# Patient Record
Sex: Male | Born: 1960 | Race: White | Hispanic: No | Marital: Married | Smoking: Former smoker
Health system: Southern US, Community
[De-identification: ages and names within clinical notes are randomized; demographics above are authoritative.]

## PROBLEM LIST (undated history)

## (undated) DIAGNOSIS — I10 Essential (primary) hypertension: Secondary | ICD-10-CM

## (undated) DIAGNOSIS — D696 Thrombocytopenia, unspecified: Secondary | ICD-10-CM

## (undated) DIAGNOSIS — Z800 Family history of malignant neoplasm of digestive organs: Secondary | ICD-10-CM

## (undated) DIAGNOSIS — R059 Cough, unspecified: Secondary | ICD-10-CM

## (undated) DIAGNOSIS — R112 Nausea with vomiting, unspecified: Secondary | ICD-10-CM

## (undated) DIAGNOSIS — N2889 Other specified disorders of kidney and ureter: Secondary | ICD-10-CM

## (undated) DIAGNOSIS — Z9889 Other specified postprocedural states: Secondary | ICD-10-CM

## (undated) DIAGNOSIS — Z803 Family history of malignant neoplasm of breast: Secondary | ICD-10-CM

## (undated) DIAGNOSIS — D72819 Decreased white blood cell count, unspecified: Secondary | ICD-10-CM

## (undated) DIAGNOSIS — R05 Cough: Secondary | ICD-10-CM

## (undated) DIAGNOSIS — E119 Type 2 diabetes mellitus without complications: Secondary | ICD-10-CM

## (undated) DIAGNOSIS — E785 Hyperlipidemia, unspecified: Secondary | ICD-10-CM

## (undated) HISTORY — PX: FRACTURE SURGERY: SHX138

## (undated) HISTORY — DX: Family history of malignant neoplasm of digestive organs: Z80.0

## (undated) HISTORY — PX: HYDROCELE EXCISION: SHX482

## (undated) HISTORY — PX: APPENDECTOMY: SHX54

## (undated) HISTORY — PX: SHOULDER SURGERY: SHX246

## (undated) HISTORY — PX: KNEE ARTHROSCOPY: SUR90

## (undated) HISTORY — PX: ANKLE SURGERY: SHX546

## (undated) HISTORY — DX: Family history of malignant neoplasm of breast: Z80.3

## (undated) HISTORY — PX: NASAL SINUS SURGERY: SHX719

## (undated) HISTORY — PX: BACK SURGERY: SHX140

## (undated) HISTORY — PX: PENILE PROSTHESIS IMPLANT: SHX240

---

## 2005-10-03 ENCOUNTER — Ambulatory Visit: Payer: Self-pay | Admitting: Otolaryngology

## 2007-06-04 ENCOUNTER — Ambulatory Visit: Payer: Self-pay | Admitting: Unknown Physician Specialty

## 2007-06-14 ENCOUNTER — Ambulatory Visit: Payer: Self-pay | Admitting: Unknown Physician Specialty

## 2007-07-02 ENCOUNTER — Ambulatory Visit: Payer: Self-pay | Admitting: Unknown Physician Specialty

## 2008-07-28 ENCOUNTER — Ambulatory Visit: Payer: Self-pay | Admitting: Urology

## 2008-08-04 ENCOUNTER — Ambulatory Visit: Payer: Self-pay | Admitting: Urology

## 2010-04-15 ENCOUNTER — Ambulatory Visit: Payer: Self-pay | Admitting: Urology

## 2010-04-21 ENCOUNTER — Ambulatory Visit: Payer: Self-pay | Admitting: Cardiovascular Disease

## 2010-05-03 ENCOUNTER — Ambulatory Visit: Payer: Self-pay | Admitting: Urology

## 2010-05-05 LAB — PATHOLOGY REPORT

## 2010-05-09 ENCOUNTER — Emergency Department: Payer: Self-pay | Admitting: Emergency Medicine

## 2014-04-14 ENCOUNTER — Encounter (HOSPITAL_COMMUNITY): Payer: Self-pay | Admitting: Pharmacy Technician

## 2014-04-15 ENCOUNTER — Encounter (HOSPITAL_COMMUNITY): Payer: Self-pay | Admitting: Surgery

## 2014-04-16 ENCOUNTER — Encounter (HOSPITAL_COMMUNITY): Payer: Self-pay | Admitting: Surgery

## 2014-04-16 ENCOUNTER — Encounter (HOSPITAL_COMMUNITY)
Admission: RE | Disposition: A | Discharge status: Home / Self Care | Payer: Self-pay | Source: Ambulatory Visit | Attending: Orthopedic Surgery

## 2014-04-16 ENCOUNTER — Ambulatory Visit (HOSPITAL_COMMUNITY): Payer: Worker's Compensation

## 2014-04-16 ENCOUNTER — Ambulatory Visit (HOSPITAL_COMMUNITY): Payer: Worker's Compensation | Admitting: Orthopedic Surgery

## 2014-04-16 ENCOUNTER — Observation Stay (HOSPITAL_COMMUNITY)
Admission: RE | Admit: 2014-04-16 | Discharge: 2014-04-17 | Disposition: A | Discharge status: Home / Self Care | Payer: Worker's Compensation | Source: Ambulatory Visit | Attending: Orthopedic Surgery | Admitting: Orthopedic Surgery

## 2014-04-16 ENCOUNTER — Encounter (HOSPITAL_COMMUNITY): Payer: Worker's Compensation | Admitting: Orthopedic Surgery

## 2014-04-16 DIAGNOSIS — M5380 Other specified dorsopathies, site unspecified: Secondary | ICD-10-CM | POA: Insufficient documentation

## 2014-04-16 DIAGNOSIS — I10 Essential (primary) hypertension: Secondary | ICD-10-CM | POA: Insufficient documentation

## 2014-04-16 DIAGNOSIS — E785 Hyperlipidemia, unspecified: Secondary | ICD-10-CM | POA: Insufficient documentation

## 2014-04-16 DIAGNOSIS — M4712 Other spondylosis with myelopathy, cervical region: Principal | ICD-10-CM | POA: Insufficient documentation

## 2014-04-16 DIAGNOSIS — Z79899 Other long term (current) drug therapy: Secondary | ICD-10-CM | POA: Insufficient documentation

## 2014-04-16 DIAGNOSIS — E119 Type 2 diabetes mellitus without complications: Secondary | ICD-10-CM | POA: Insufficient documentation

## 2014-04-16 DIAGNOSIS — Z981 Arthrodesis status: Secondary | ICD-10-CM

## 2014-04-16 HISTORY — PX: ANTERIOR CERVICAL DECOMP/DISCECTOMY FUSION: SHX1161

## 2014-04-16 HISTORY — DX: Type 2 diabetes mellitus without complications: E11.9

## 2014-04-16 HISTORY — DX: Other specified postprocedural states: Z98.890

## 2014-04-16 HISTORY — DX: Nausea with vomiting, unspecified: R11.2

## 2014-04-16 HISTORY — DX: Hyperlipidemia, unspecified: E78.5

## 2014-04-16 LAB — CBC
HCT: 37.3 % — ABNORMAL LOW (ref 39.0–52.0)
Hemoglobin: 13.3 g/dL (ref 13.0–17.0)
MCH: 33.3 pg (ref 26.0–34.0)
MCHC: 35.7 g/dL (ref 30.0–36.0)
MCV: 93.5 fL (ref 78.0–100.0)
Platelets: 63 10*3/uL — ABNORMAL LOW (ref 150–400)
RBC: 3.99 MIL/uL — ABNORMAL LOW (ref 4.22–5.81)
RDW: 13 % (ref 11.5–15.5)
WBC: 3.6 10*3/uL — ABNORMAL LOW (ref 4.0–10.5)

## 2014-04-16 LAB — BASIC METABOLIC PANEL
Anion gap: 16 — ABNORMAL HIGH (ref 5–15)
BUN: 20 mg/dL (ref 6–23)
CO2: 20 mEq/L (ref 19–32)
Calcium: 9.3 mg/dL (ref 8.4–10.5)
Chloride: 96 mEq/L (ref 96–112)
Creatinine, Ser: 1.12 mg/dL (ref 0.50–1.35)
GFR calc Af Amer: 86 mL/min — ABNORMAL LOW (ref >90)
GFR calc non Af Amer: 74 mL/min — ABNORMAL LOW (ref >90)
Glucose, Bld: 182 mg/dL — ABNORMAL HIGH (ref 70–99)
Potassium: 4.4 mEq/L (ref 3.7–5.3)
Sodium: 132 mEq/L — ABNORMAL LOW (ref 137–147)

## 2014-04-16 LAB — SURGICAL PCR SCREEN
MRSA, PCR: NEGATIVE
Staphylococcus aureus: NEGATIVE

## 2014-04-16 LAB — GLUCOSE, CAPILLARY
Glucose-Capillary: 193 mg/dL — ABNORMAL HIGH (ref 70–99)
Glucose-Capillary: 166 mg/dL — ABNORMAL HIGH (ref 70–99)
Glucose-Capillary: 159 mg/dL — ABNORMAL HIGH (ref 70–99)
Glucose-Capillary: 170 mg/dL — ABNORMAL HIGH (ref 70–99)
Glucose-Capillary: 226 mg/dL — ABNORMAL HIGH (ref 70–99)

## 2014-04-16 SURGERY — ANTERIOR CERVICAL DECOMPRESSION/DISCECTOMY FUSION 2 LEVELS
Anesthesia: General

## 2014-04-16 MED ORDER — THROMBIN 20000 UNITS EX SOLR
CUTANEOUS | Status: AC
  Filled 2014-04-16: qty 20000

## 2014-04-16 MED ORDER — SCOPOLAMINE 1 MG/3DAYS TD PT72
1.0000 | MEDICATED_PATCH | TRANSDERMAL | Status: DC
  Administered 2014-04-16: 1.5 mg via TRANSDERMAL
  Filled 2014-04-16: qty 1

## 2014-04-16 MED ORDER — PROPOFOL 10 MG/ML IV BOLUS
INTRAVENOUS | Status: DC | PRN
  Administered 2014-04-16: 180 mg via INTRAVENOUS

## 2014-04-16 MED ORDER — LIDOCAINE HCL (CARDIAC) 20 MG/ML IV SOLN
INTRAVENOUS | Status: DC | PRN
  Administered 2014-04-16: 80 mg via INTRAVENOUS

## 2014-04-16 MED ORDER — FENTANYL CITRATE 0.05 MG/ML IJ SOLN
INTRAMUSCULAR | Status: DC | PRN
  Administered 2014-04-16 (×2): 50 ug via INTRAVENOUS
  Administered 2014-04-16: 100 ug via INTRAVENOUS
  Administered 2014-04-16: 50 ug via INTRAVENOUS
  Administered 2014-04-16: 100 ug via INTRAVENOUS

## 2014-04-16 MED ORDER — INSULIN ASPART 100 UNIT/ML ~~LOC~~ SOLN
0.0000 [IU] | Freq: Three times a day (TID) | SUBCUTANEOUS | Status: DC
  Administered 2014-04-16: 3 [IU] via SUBCUTANEOUS
  Administered 2014-04-17: 5 [IU] via SUBCUTANEOUS
  Administered 2014-04-17: 3 [IU] via SUBCUTANEOUS

## 2014-04-16 MED ORDER — CARVEDILOL 25 MG PO TABS
50.0000 mg | ORAL_TABLET | Freq: Two times a day (BID) | ORAL | Status: DC
  Administered 2014-04-16 – 2014-04-17 (×2): 50 mg via ORAL
  Filled 2014-04-16 (×4): qty 2

## 2014-04-16 MED ORDER — SENNOSIDES-DOCUSATE SODIUM 8.6-50 MG PO TABS
1.0000 | ORAL_TABLET | Freq: Every evening | ORAL | Status: DC | PRN
Start: 2014-04-16 — End: 2014-04-17

## 2014-04-16 MED ORDER — MUPIROCIN 2 % EX OINT
TOPICAL_OINTMENT | CUTANEOUS | Status: AC
  Administered 2014-04-16: 1 via TOPICAL
  Filled 2014-04-16: qty 22

## 2014-04-16 MED ORDER — NEOSTIGMINE METHYLSULFATE 10 MG/10ML IV SOLN
INTRAVENOUS | Status: DC | PRN
  Administered 2014-04-16: 3 mg via INTRAVENOUS

## 2014-04-16 MED ORDER — ONDANSETRON HCL 4 MG/2ML IJ SOLN: 4.0000 mg | Freq: Four times a day (QID) | INTRAMUSCULAR | Status: DC | PRN

## 2014-04-16 MED ORDER — FENTANYL CITRATE 0.05 MG/ML IJ SOLN
25.0000 ug | INTRAMUSCULAR | Status: DC | PRN
  Administered 2014-04-16 (×2): 50 ug via INTRAVENOUS

## 2014-04-16 MED ORDER — DEXAMETHASONE SODIUM PHOSPHATE 10 MG/ML IJ SOLN
INTRAMUSCULAR | Status: DC | PRN
  Administered 2014-04-16: 10 mg via INTRAVENOUS

## 2014-04-16 MED ORDER — 0.9 % SODIUM CHLORIDE (POUR BTL) OPTIME
TOPICAL | Status: DC | PRN
  Administered 2014-04-16: 2000 mL

## 2014-04-16 MED ORDER — ONDANSETRON HCL 4 MG PO TABS
4.0000 mg | ORAL_TABLET | Freq: Four times a day (QID) | ORAL | Status: DC | PRN
Start: 2014-04-16 — End: 2014-04-17

## 2014-04-16 MED ORDER — HYDROCHLOROTHIAZIDE 25 MG PO TABS
25.0000 mg | ORAL_TABLET | Freq: Every day | ORAL | Status: DC
  Administered 2014-04-17: 25 mg via ORAL
  Filled 2014-04-16: qty 1

## 2014-04-16 MED ORDER — FENTANYL CITRATE 0.05 MG/ML IJ SOLN
INTRAMUSCULAR | Status: AC
  Filled 2014-04-16: qty 5

## 2014-04-16 MED ORDER — ACETAMINOPHEN 10 MG/ML IV SOLN
1000.0000 mg | INTRAVENOUS | Status: DC
  Filled 2014-04-16: qty 100

## 2014-04-16 MED ORDER — THROMBIN 20000 UNITS EX SOLR
OROMUCOSAL | Status: DC | PRN
  Administered 2014-04-16: 13:00:00 via TOPICAL

## 2014-04-16 MED ORDER — DIPHENHYDRAMINE HCL 50 MG/ML IJ SOLN
INTRAMUSCULAR | Status: DC | PRN
  Administered 2014-04-16: 12.5 mg via INTRAVENOUS

## 2014-04-16 MED ORDER — ONDANSETRON HCL 4 MG/2ML IJ SOLN
INTRAMUSCULAR | Status: DC | PRN
  Administered 2014-04-16: 4 mg via INTRAVENOUS

## 2014-04-16 MED ORDER — MORPHINE SULFATE 2 MG/ML IJ SOLN: 1.0000 mg | INTRAMUSCULAR | Status: DC | PRN

## 2014-04-16 MED ORDER — CANAGLIFLOZIN 300 MG PO TABS
300.0000 mg | ORAL_TABLET | Freq: Every day | ORAL | Status: DC
  Administered 2014-04-17: 300 mg via ORAL
  Filled 2014-04-16 (×2): qty 1

## 2014-04-16 MED ORDER — PROMETHAZINE HCL 25 MG/ML IJ SOLN: 6.2500 mg | INTRAMUSCULAR | Status: DC | PRN

## 2014-04-16 MED ORDER — ACETAMINOPHEN 10 MG/ML IV SOLN
1000.0000 mg | Freq: Four times a day (QID) | INTRAVENOUS | Status: DC
  Administered 2014-04-16 – 2014-04-17 (×3): 1000 mg via INTRAVENOUS
  Filled 2014-04-16 (×4): qty 100

## 2014-04-16 MED ORDER — SUCCINYLCHOLINE CHLORIDE 20 MG/ML IJ SOLN
INTRAMUSCULAR | Status: DC | PRN
  Administered 2014-04-16: 100 mg via INTRAVENOUS

## 2014-04-16 MED ORDER — LACTATED RINGERS IV SOLN
INTRAVENOUS | Status: DC
  Administered 2014-04-16: 10:00:00 via INTRAVENOUS

## 2014-04-16 MED ORDER — MIDAZOLAM HCL 2 MG/2ML IJ SOLN
INTRAMUSCULAR | Status: AC
  Filled 2014-04-16: qty 2

## 2014-04-16 MED ORDER — LINAGLIPTIN 5 MG PO TABS
5.0000 mg | ORAL_TABLET | Freq: Every day | ORAL | Status: DC
  Administered 2014-04-17: 5 mg via ORAL
  Filled 2014-04-16 (×2): qty 1

## 2014-04-16 MED ORDER — GLYCOPYRROLATE 0.2 MG/ML IJ SOLN
INTRAMUSCULAR | Status: DC | PRN
  Administered 2014-04-16: .4 mg via INTRAVENOUS
  Administered 2014-04-16: 0.2 mg via INTRAVENOUS

## 2014-04-16 MED ORDER — OXYCODONE HCL 5 MG PO TABS: 5.0000 mg | ORAL_TABLET | ORAL | Status: DC | PRN

## 2014-04-16 MED ORDER — MIDAZOLAM HCL 5 MG/5ML IJ SOLN
INTRAMUSCULAR | Status: DC | PRN
  Administered 2014-04-16: 2 mg via INTRAVENOUS

## 2014-04-16 MED ORDER — LOSARTAN POTASSIUM 50 MG PO TABS
100.0000 mg | ORAL_TABLET | Freq: Every day | ORAL | Status: DC
  Administered 2014-04-17: 100 mg via ORAL
  Filled 2014-04-16: qty 2

## 2014-04-16 MED ORDER — METHOCARBAMOL 500 MG PO TABS
500.0000 mg | ORAL_TABLET | Freq: Four times a day (QID) | ORAL | Status: DC | PRN
  Administered 2014-04-16: 500 mg via ORAL

## 2014-04-16 MED ORDER — ROCURONIUM BROMIDE 100 MG/10ML IV SOLN
INTRAVENOUS | Status: DC | PRN
  Administered 2014-04-16: 20 mg via INTRAVENOUS
  Administered 2014-04-16: 30 mg via INTRAVENOUS
  Administered 2014-04-16: 50 mg via INTRAVENOUS

## 2014-04-16 MED ORDER — HEMOSTATIC AGENTS (NO CHARGE) OPTIME
TOPICAL | Status: DC | PRN
  Administered 2014-04-16: 1 via TOPICAL

## 2014-04-16 MED ORDER — METOCLOPRAMIDE HCL 10 MG PO TABS: 5.0000 mg | ORAL_TABLET | Freq: Three times a day (TID) | ORAL | Status: DC | PRN

## 2014-04-16 MED ORDER — LACTATED RINGERS IV SOLN
INTRAVENOUS | Status: DC | PRN
  Administered 2014-04-16 (×3): via INTRAVENOUS

## 2014-04-16 MED ORDER — DOCUSATE SODIUM 100 MG PO CAPS
100.0000 mg | ORAL_CAPSULE | Freq: Two times a day (BID) | ORAL | Status: DC
  Administered 2014-04-16 – 2014-04-17 (×2): 100 mg via ORAL
  Filled 2014-04-16 (×2): qty 1

## 2014-04-16 MED ORDER — METHOCARBAMOL 1000 MG/10ML IJ SOLN
500.0000 mg | Freq: Four times a day (QID) | INTRAVENOUS | Status: DC | PRN
  Filled 2014-04-16: qty 5

## 2014-04-16 MED ORDER — FENTANYL CITRATE 0.05 MG/ML IJ SOLN
INTRAMUSCULAR | Status: AC
  Administered 2014-04-16: 50 ug via INTRAVENOUS
  Filled 2014-04-16: qty 2

## 2014-04-16 MED ORDER — CEFAZOLIN SODIUM 1-5 GM-% IV SOLN
1.0000 g | Freq: Four times a day (QID) | INTRAVENOUS | Status: AC
  Administered 2014-04-16: 2 g via INTRAVENOUS
  Administered 2014-04-16 – 2014-04-17 (×2): 1 g via INTRAVENOUS
  Filled 2014-04-16 (×3): qty 50

## 2014-04-16 MED ORDER — DEXMEDETOMIDINE HCL 200 MCG/2ML IV SOLN
INTRAVENOUS | Status: DC | PRN
  Administered 2014-04-16 (×2): 10 ug via INTRAVENOUS

## 2014-04-16 MED ORDER — PHENYLEPHRINE HCL 10 MG/ML IJ SOLN
INTRAMUSCULAR | Status: DC | PRN
  Administered 2014-04-16 (×2): 80 ug via INTRAVENOUS
  Administered 2014-04-16: 40 ug via INTRAVENOUS

## 2014-04-16 MED ORDER — AMLODIPINE BESYLATE 10 MG PO TABS
10.0000 mg | ORAL_TABLET | Freq: Every day | ORAL | Status: DC
  Administered 2014-04-17: 10 mg via ORAL
  Filled 2014-04-16: qty 1

## 2014-04-16 MED ORDER — PROPOFOL 10 MG/ML IV BOLUS
INTRAVENOUS | Status: AC
  Filled 2014-04-16: qty 20

## 2014-04-16 MED ORDER — METHOCARBAMOL 500 MG PO TABS
ORAL_TABLET | ORAL | Status: AC
  Administered 2014-04-16: 500 mg via ORAL
  Filled 2014-04-16: qty 1

## 2014-04-16 MED ORDER — BUPIVACAINE-EPINEPHRINE 0.25% -1:200000 IJ SOLN
INTRAMUSCULAR | Status: DC | PRN
  Administered 2014-04-16: 10 mL

## 2014-04-16 MED ORDER — POTASSIUM CHLORIDE IN NACL 20-0.45 MEQ/L-% IV SOLN
INTRAVENOUS | Status: DC
  Administered 2014-04-16: 19:00:00 via INTRAVENOUS
  Filled 2014-04-16 (×3): qty 1000

## 2014-04-16 MED ORDER — LOSARTAN POTASSIUM-HCTZ 100-25 MG PO TABS: 1.0000 | ORAL_TABLET | Freq: Every day | ORAL | Status: DC

## 2014-04-16 MED ORDER — METOCLOPRAMIDE HCL 5 MG/ML IJ SOLN: 5.0000 mg | Freq: Three times a day (TID) | INTRAMUSCULAR | Status: DC | PRN

## 2014-04-16 MED ORDER — MUPIROCIN 2 % EX OINT
1.0000 "application " | TOPICAL_OINTMENT | Freq: Once | CUTANEOUS | Status: AC
  Administered 2014-04-16: 1 via TOPICAL

## 2014-04-16 MED ORDER — MEPERIDINE HCL 25 MG/ML IJ SOLN: 6.2500 mg | INTRAMUSCULAR | Status: DC | PRN

## 2014-04-16 MED ORDER — ACETAMINOPHEN 10 MG/ML IV SOLN
INTRAVENOUS | Status: DC | PRN
  Administered 2014-04-16: 1000 mg via INTRAVENOUS

## 2014-04-16 SURGICAL SUPPLY — 65 items
BENZOIN TINCTURE PRP APPL 2/3 (GAUZE/BANDAGES/DRESSINGS) ×2 IMPLANT
BLADE SURG ROTATE 9660 (MISCELLANEOUS) IMPLANT
BUR EGG ELITE 4.0 (BURR) IMPLANT
BUR MATCHSTICK NEURO 3.0 LAGG (BURR) IMPLANT
CANISTER SUCTION 2500CC (MISCELLANEOUS) ×2 IMPLANT
CLSR STERI-STRIP ANTIMIC 1/2X4 (GAUZE/BANDAGES/DRESSINGS) ×2 IMPLANT
CORDS BIPOLAR (ELECTRODE) ×2 IMPLANT
COVER SURGICAL LIGHT HANDLE (MISCELLANEOUS) ×2 IMPLANT
CRADLE DONUT ADULT HEAD (MISCELLANEOUS) ×2 IMPLANT
DEVICE ENDSKLTN TC MED 8MM (Orthopedic Implant) ×2 IMPLANT
DRAPE C-ARM 42X72 X-RAY (DRAPES) ×2 IMPLANT
DRAPE POUCH INSTRU U-SHP 10X18 (DRAPES) ×2 IMPLANT
DRAPE SURG 17X23 STRL (DRAPES) ×2 IMPLANT
DRAPE U-SHAPE 47X51 STRL (DRAPES) ×2 IMPLANT
DRSG MEPILEX BORDER 4X4 (GAUZE/BANDAGES/DRESSINGS) ×2 IMPLANT
DURAPREP 26ML APPLICATOR (WOUND CARE) ×2 IMPLANT
ELECT COATED BLADE 2.86 ST (ELECTRODE) ×2 IMPLANT
ELECT PENCIL ROCKER SW 15FT (MISCELLANEOUS) ×2 IMPLANT
ELECT REM PT RETURN 9FT ADLT (ELECTROSURGICAL) ×2
ELECTRODE REM PT RTRN 9FT ADLT (ELECTROSURGICAL) ×1 IMPLANT
ENDOSKELTON TC IMPLANT 8MM MED (Orthopedic Implant) ×4 IMPLANT
GLOVE BIOGEL PI IND STRL 7.5 (GLOVE) ×1 IMPLANT
GLOVE BIOGEL PI IND STRL 8 (GLOVE) ×1 IMPLANT
GLOVE BIOGEL PI IND STRL 8.5 (GLOVE) ×1 IMPLANT
GLOVE BIOGEL PI INDICATOR 7.5 (GLOVE) ×1
GLOVE BIOGEL PI INDICATOR 8 (GLOVE) ×1
GLOVE BIOGEL PI INDICATOR 8.5 (GLOVE) ×1
GLOVE ECLIPSE 8.5 STRL (GLOVE) ×2 IMPLANT
GLOVE ORTHO TXT STRL SZ7.5 (GLOVE) ×2 IMPLANT
GLOVE SURG SS PI 6.5 STRL IVOR (GLOVE) ×2 IMPLANT
GOWN STRL REUS W/ TWL XL LVL3 (GOWN DISPOSABLE) ×2 IMPLANT
GOWN STRL REUS W/TWL 2XL LVL3 (GOWN DISPOSABLE) ×2 IMPLANT
GOWN STRL REUS W/TWL XL LVL3 (GOWN DISPOSABLE) ×2
KIT BASIN OR (CUSTOM PROCEDURE TRAY) ×2 IMPLANT
KIT ROOM TURNOVER OR (KITS) ×2 IMPLANT
NEEDLE SPNL 18GX3.5 QUINCKE PK (NEEDLE) ×2 IMPLANT
NS IRRIG 1000ML POUR BTL (IV SOLUTION) ×2 IMPLANT
PACK ORTHO CERVICAL (CUSTOM PROCEDURE TRAY) ×4 IMPLANT
PACK UNIVERSAL I (CUSTOM PROCEDURE TRAY) ×2 IMPLANT
PAD ARMBOARD 7.5X6 YLW CONV (MISCELLANEOUS) ×4 IMPLANT
PATTIES SURGICAL .25X.25 (GAUZE/BANDAGES/DRESSINGS) ×2 IMPLANT
PIN RETAINER PRODISC 14 MM (PIN) ×2 IMPLANT
PLATE SKYLINE TWO LEVEL 32MM (Plate) ×2 IMPLANT
PUTTY BONE DBX 5CC MIX (Putty) ×2 IMPLANT
RESTRAINT LIMB HOLDER UNIV (RESTRAINTS) ×2 IMPLANT
SCREW SKYLINE 14MM SD-VA (Screw) ×8 IMPLANT
SCREW SKYLINE 16MM (Screw) ×4 IMPLANT
SPONGE INTESTINAL PEANUT (DISPOSABLE) ×2 IMPLANT
SPONGE LAP 4X18 X RAY DECT (DISPOSABLE) ×4 IMPLANT
SPONGE SURGIFOAM ABS GEL 100 (HEMOSTASIS) ×2 IMPLANT
SURGIFLO TRUKIT (HEMOSTASIS) IMPLANT
SURGIFLO W/THROMBIN 8M KIT (HEMOSTASIS) ×2 IMPLANT
SUT BONE WAX W31G (SUTURE) ×2 IMPLANT
SUT MON AB 3-0 SH 27 (SUTURE) ×1
SUT MON AB 3-0 SH27 (SUTURE) ×1 IMPLANT
SUT SILK 2 0 (SUTURE)
SUT SILK 2-0 18XBRD TIE 12 (SUTURE) IMPLANT
SUT VIC AB 2-0 CT1 18 (SUTURE) ×2 IMPLANT
SYR BULB IRRIGATION 50ML (SYRINGE) ×2 IMPLANT
SYR CONTROL 10ML LL (SYRINGE) ×2 IMPLANT
TAPE CLOTH 4X10 WHT NS (GAUZE/BANDAGES/DRESSINGS) ×2 IMPLANT
TAPE UMBILICAL COTTON 1/8X30 (MISCELLANEOUS) ×2 IMPLANT
TOWEL OR 17X24 6PK STRL BLUE (TOWEL DISPOSABLE) ×2 IMPLANT
TOWEL OR 17X26 10 PK STRL BLUE (TOWEL DISPOSABLE) ×2 IMPLANT
WATER STERILE IRR 1000ML POUR (IV SOLUTION) IMPLANT

## 2014-04-16 NOTE — Anesthesia Postprocedure Evaluation (Signed)
  Anesthesia Post-op Note  Patient: Henry Moore  Procedure(s) Performed: Procedure(s): ANTERIOR CERVICAL DECOMPRESSION/DISCECTOMY FUSION  (ACDF C5-C7)   (2 LEVELS)      (N/A)  Patient Location: PACU  Anesthesia Type:General  Level of Consciousness: awake, alert , oriented and patient cooperative  Airway and Oxygen Therapy: Patient Spontanous Breathing  Post-op Pain: mild  Post-op Assessment: Post-op Vital signs reviewed, Patient's Cardiovascular Status Stable, Respiratory Function Stable, Patent Airway, No signs of Nausea or vomiting and Pain level controlled  Post-op Vital Signs: Reviewed and stable  Last Vitals:  Filed Vitals:   04/16/14 1700  BP: 122/68  Pulse: 65  Temp: 36.8 C  Resp: 13    Complications: No apparent anesthesia complications

## 2014-04-16 NOTE — H&P (Signed)
History of Present Illness  The patient is a 53 year old male who presents with neck pain. The patient is seen today in referral from Dr Gladstone Lighter. The patient reports symptoms involving the entire neck which began 6 month(s) ago. The symptoms began following a specific injury. The injury occurred while the patient was at work (shoveling sand and "something gave in the neck and back"). Symptoms include neck pain, neck stiffness, impaired range of motion and numbness (into both hands).The patient describes their symptoms as moderate in severity.The patient does feel that the symptoms are worsening. Symptoms are exacerbated by use of the right arm, use of the left arm, neck flexion, neck extension and neck movement. The patient is not currently being treated for this problem. Prior to being seen today the patient was previously evaluated in this clinic (Dr Gladstone Lighter). Past evaluation has included cervical spine MRI ([facility]). 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  Work Status- Light Duty  Reason for Consultation: Significant neck and right arm pain as well as thoracic and lumbar pain.  Bedford is a very pleasant gentleman who unfortunately injured himself in the course of performing his normal job activities. He was trying to clear out a hopper of hardened salt in February and felt a pop in his neck. Subsequently he was diagnosed with a cervical strain as well as thoracic and lumbar strain. Ultimately not improving, he had cervical, thoracic, and lumbar MRIs. As a result of those studies, he was referred to me for further work up. For the last 6 months he has been complaining of significant pain with a burning dysesthesia into the right upper extremity. He describes as sensation of pins and needles and discomfort. He has not really noticed any significant weakness, but he is quite concerned because of the pain.  Allergies  Codeine/Codeine Derivatives Itching.  Family  History Diabetes Mellitus Mother.  Social History  Not under pain contract Tobacco use Current every day smoker. 10/11/2013: smoke(d) 1/2 pack(s) per day Number of flights of stairs before winded greater than 5 Children 2 Current drinker 10/11/2013: Currently drinks beer 5-7 times per week No history of drug/alcohol rehab Marital status married Living situation live with spouse Exercise Exercises weekly; does running / walking Current work status working full time  Medication History AmLODIPine Besylate (10MG  Tablet, Oral) Active. Lovastatin (40MG  Tablet, Oral) Active. Losartan Potassium-HCTZ (100-25MG  Tablet, Oral) Active. Januvia (100MG  Tablet, Oral) Active. Carvedilol (25MG  Tablet, Oral) Active. Carisoprodol (350MG  Tablet, Oral) Active.  Past Surgical History  Ankle Surgery left Appendectomy Sinus Surgery Arthroscopy of Knee bilateral  Other Problems High blood pressure Diabetes Mellitus, Type II Hypercholesterolemia  Objective Transcription  He is a pleasant gentleman who appears his stated age in no acute distress. He is alert and oriented times three. He has stiffness and loss of normal cervical and lumbar range of motion. He has pain with palpation and range of motion testing. He has positive Spurling sign on the right side. He has numbness and dysesthesias in the C6 and C7 distributions on the right side only. He has 1+ symmetrical deep tendon reflexes. Negative Babinski test. Negative Hoffmann sign. No clonus. No real shoulder, elbow, or wrist pain with isolated joint range of motion. Negative straight leg raise test. No localized hip, knee, or ankle pain with joint range of motion.  RADIOGRAPHS His MRI of the lumbar and thoracic spine are essentially unremarkable. No significant pathology noted.  The MRI of his cervical spine shows 2-level disc  protrusion with osteophytes. C6-C7 is the most prominent with compression and mass effect on the  right C7 nerve root and there is some irritation and contact to the C6 nerve root.   Plans Transcription  At this point in time, clinically the patient has both C6 and C7 radicular symptoms. These include numbness and some trace weakness in the biceps, triceps, and wrist extensor. Given the fact that this has been going on for over 6 months and his pain has been getting worse, I am quite concerned. We have talked about a nonoperative treatment consisting of physiotherapy and injection therapy and we have reviewed a surgical solution which would be a 2-level ACDF. At this point after reviewing the risks infection, bleeding, nerve damage, death, stroke, paralysis, failure to heal, need for further surgery, and ongoing or worse pain, loss of bowel or bladder control, throat pain, swallowing difficulties, hoarseness in the voice, hardware failure, failure to fuse, and need for posterior surgery, he has decided to proceed with surgery. We have gone over the procedure which would be a 2-level ACDF. My hope would be that at bout 9-10 months postop he would be able to return to full duties and hopefully between 9-12 months, he would reach [place]. For the first 6 weeks after surgery, he would be taking it easy. Following that, he would start formalized physical therapy with a transition to a work Medical illustrator and hopefully gradual return up to his regular duties. Because of the nicotine use, I would like to use a stiff collar for 6 weeks as well as an external bone stimulator for about 9 months. All of this was done in the presence of the patient, his case manager, and his employer. All of their questions were addressed. We will get preoperative medical clearance from his primary care physician given the fact that he has diabetes. We have had a long talk, again, about smoking cessation and he has indicated that he will take all steps to curtail and stop his nicotine use.

## 2014-04-16 NOTE — Transfer of Care (Signed)
Immediate Anesthesia Transfer of Care Note  Patient: Henry Moore  Procedure(s) Performed: Procedure(s): ANTERIOR CERVICAL DECOMPRESSION/DISCECTOMY FUSION  (ACDF C5-C7)   (2 LEVELS)      (N/A)  Patient Location: PACU  Anesthesia Type:General  Level of Consciousness: awake, alert  and oriented  Airway & Oxygen Therapy: Patient Spontanous Breathing and Patient connected to face mask oxygen  Post-op Assessment: Report given to PACU RN and Post -op Vital signs reviewed and stable  Post vital signs: Reviewed and stable  Complications: No apparent anesthesia complications

## 2014-04-16 NOTE — Anesthesia Preprocedure Evaluation (Addendum)
Anesthesia Evaluation  Patient identified by MRN, date of birth, ID band Patient awake    Reviewed: Allergy & Precautions, H&P , NPO status , Patient's Chart, lab work & pertinent test results  History of Anesthesia Complications (+) PONV  Airway Mallampati: II      Dental   Pulmonary Current Smoker,  breath sounds clear to auscultation        Cardiovascular hypertension, Pt. on medications Rhythm:Regular Rate:Normal     Neuro/Psych    GI/Hepatic   Endo/Other  diabetes, Type 2  Renal/GU      Musculoskeletal   Abdominal   Peds  Hematology   Anesthesia Other Findings Facial hair  Reproductive/Obstetrics                         Anesthesia Physical Anesthesia Plan  ASA: III  Anesthesia Plan: General   Post-op Pain Management:    Induction: Intravenous  Airway Management Planned: Oral ETT  Additional Equipment:   Intra-op Plan:   Post-operative Plan: Extubation in OR  Informed Consent: I have reviewed the patients History and Physical, chart, labs and discussed the procedure including the risks, benefits and alternatives for the proposed anesthesia with the patient or authorized representative who has indicated his/her understanding and acceptance.     Plan Discussed with:   Anesthesia Plan Comments:         Anesthesia Quick Evaluation

## 2014-04-16 NOTE — Brief Op Note (Signed)
04/16/2014  4:13 PM  PATIENT:  Henry Moore  53 y.o. male  PRE-OPERATIVE DIAGNOSIS:  2 LEVEL CERVICAL RADICULOPATHY   POST-OPERATIVE DIAGNOSIS:  2 LEVEL CERVICAL RADICULOPATHY   PROCEDURE:  Procedure(s): ANTERIOR CERVICAL DECOMPRESSION/DISCECTOMY FUSION  (ACDF C5-C7)   (2 LEVELS)      (N/A)  SURGEON:  Surgeon(s) and Role:    * Melina Schools, MD - Primary  PHYSICIAN ASSISTANT:   ASSISTANTS: Benjiman Core   ANESTHESIA:   none  EBL:  Total I/O In: 2000 [I.V.:2000] Out: 40 [Blood:40]  BLOOD ADMINISTERED:none  DRAINS: none   LOCAL MEDICATIONS USED:  MARCAINE     SPECIMEN:  No Specimen  DISPOSITION OF SPECIMEN:  N/A  COUNTS:  YES  TOURNIQUET:  * No tourniquets in log *  DICTATION: .Other Dictation: Dictation Number [id number]  PLAN OF CARE: Admit for overnight observation  PATIENT DISPOSITION:  PACU - hemodynamically stable.

## 2014-04-17 ENCOUNTER — Observation Stay (HOSPITAL_COMMUNITY): Payer: Worker's Compensation

## 2014-04-17 ENCOUNTER — Encounter (HOSPITAL_COMMUNITY): Payer: Self-pay | Admitting: Orthopedic Surgery

## 2014-04-17 LAB — BASIC METABOLIC PANEL
Anion gap: 13 (ref 5–15)
BUN: 21 mg/dL (ref 6–23)
CO2: 21 mEq/L (ref 19–32)
Calcium: 8.8 mg/dL (ref 8.4–10.5)
Chloride: 97 mEq/L (ref 96–112)
Creatinine, Ser: 0.98 mg/dL (ref 0.50–1.35)
GFR calc Af Amer: >90 mL/min (ref >90)
GFR calc non Af Amer: >90 mL/min (ref >90)
Glucose, Bld: 166 mg/dL — ABNORMAL HIGH (ref 70–99)
Potassium: 4.9 mEq/L (ref 3.7–5.3)
Sodium: 131 mEq/L — ABNORMAL LOW (ref 137–147)

## 2014-04-17 LAB — GLUCOSE, CAPILLARY
Glucose-Capillary: 179 mg/dL — ABNORMAL HIGH (ref 70–99)
Glucose-Capillary: 203 mg/dL — ABNORMAL HIGH (ref 70–99)

## 2014-04-17 MED ORDER — POLYETHYLENE GLYCOL 3350 17 G PO PACK: 17.0000 g | PACK | Freq: Every day | ORAL | Status: DC

## 2014-04-17 MED ORDER — DOCUSATE SODIUM 100 MG PO CAPS: 100.0000 mg | ORAL_CAPSULE | Freq: Two times a day (BID) | ORAL | Status: DC

## 2014-04-17 MED ORDER — METHOCARBAMOL 500 MG PO TABS: 500.0000 mg | ORAL_TABLET | Freq: Four times a day (QID) | ORAL | Status: DC | PRN

## 2014-04-17 MED ORDER — ONDANSETRON 4 MG PO TBDP: 4.0000 mg | ORAL_TABLET | Freq: Three times a day (TID) | ORAL | Status: DC | PRN

## 2014-04-17 MED ORDER — OXYCODONE-ACETAMINOPHEN 10-325 MG PO TABS
1.0000 | ORAL_TABLET | Freq: Four times a day (QID) | ORAL | Status: DC | PRN
Start: 2014-04-17 — End: 2015-05-13

## 2014-04-17 NOTE — Discharge Summary (Signed)
Patient ID: Henry Moore MRN: [id number] DOB/AGE: 1961/06/21 53 y.o.  Admit date: 04/16/2014 Discharge date: 04/17/2014  Admission Diagnoses:  Active Problems:   S/P cervical spinal fusion   Discharge Diagnoses:  Active Problems:   S/P cervical spinal fusion  status post Procedure(s): ANTERIOR CERVICAL DECOMPRESSION/DISCECTOMY FUSION  (ACDF C5-C7)   (2 LEVELS)       Past Medical History  Diagnosis Date  . PONV (postoperative nausea and vomiting)   . Hypertension   . Diabetes mellitus without complication   . Hyperlipemia     Surgeries: Procedure(s): ANTERIOR CERVICAL DECOMPRESSION/DISCECTOMY FUSION  (ACDF C5-C7)   (2 LEVELS)      on 04/16/2014   Consultants:    Discharged Condition: Improved  Hospital Course: Henry Moore is an 53 y.o. male who was admitted 04/16/2014 for operative treatment of cervical ddd/hnp. Patient failed conservative treatments (please see the history and physical for the specifics) and had severe unremitting pain that affects sleep, daily activities and work/hobbies. After pre-op clearance, the patient was taken to the operating room on 04/16/2014 and underwent  Procedure(s): ANTERIOR CERVICAL DECOMPRESSION/DISCECTOMY FUSION  (ACDF C5-C7)   (2 LEVELS)     .    Patient was given perioperative antibiotics: Anti-infectives   Start     Dose/Rate Route Frequency Ordered Stop   04/16/14 2000  ceFAZolin (ANCEF) IVPB 1 g/50 mL premix     1 g 100 mL/hr over 30 Minutes Intravenous Every 6 hours 04/16/14 1734 04/17/14 0354       Patient was given sequential compression devices and early ambulation to prevent DVT.   Patient benefited maximally from hospital stay and there were no complications. At the time of discharge, the patient was urinating/moving their bowels without difficulty, tolerating a regular diet, pain is controlled with oral pain medications and they have been cleared by PT/OT.   Recent vital signs: Patient Vitals for the past  24 hrs:  BP Temp Temp src Pulse Resp SpO2  04/17/14 1005 132/70 mmHg 98 F (36.7 C) Oral 68 16 97 %  04/17/14 0800 123/64 mmHg 97.4 F (36.3 C) Oral 68 17 100 %  04/17/14 0259 133/68 mmHg 98.4 F (36.9 C) Oral 63 16 100 %  04/16/14 2009 120/67 mmHg 98.3 F (36.8 C) Oral 68 16 99 %  04/16/14 1716 125/67 mmHg 98.3 F (36.8 C) - 68 14 98 %  04/16/14 1700 122/68 mmHg 98.3 F (36.8 C) - 65 13 99 %  04/16/14 1645 121/64 mmHg - - 65 13 97 %  04/16/14 1630 121/62 mmHg - - 67 15 96 %  04/16/14 1615 131/67 mmHg 98.6 F (37 C) - 76 16 96 %     Recent laboratory studies:  Recent Labs  04/16/14 0916 04/17/14 0545  WBC 3.6*  --   HGB 13.3  --   HCT 37.3*  --   PLT 63*  --   NA 132* 131*  K 4.4 4.9  CL 96 97  CO2 20 21  BUN 20 21  CREATININE 1.12 0.98  GLUCOSE 182* 166*  CALCIUM 9.3 8.8     Discharge Medications:     Medication List         amLODipine 10 MG tablet  Commonly known as:  NORVASC  Take 10 mg by mouth daily.     carvedilol 25 MG tablet  Commonly known as:  COREG  Take 50 mg by mouth 2 (two) times daily with a meal.  docusate sodium 100 MG capsule  Commonly known as:  COLACE  Take 1 capsule (100 mg total) by mouth 2 (two) times daily.     INVOKANA 300 MG Tabs  Generic drug:  Canagliflozin  Take 300 mg by mouth daily.     losartan-hydrochlorothiazide 100-25 MG per tablet  Commonly known as:  HYZAAR  Take 1 tablet by mouth daily.     lovastatin 40 MG tablet  Commonly known as:  MEVACOR  Take 40 mg by mouth at bedtime.     methocarbamol 500 MG tablet  Commonly known as:  ROBAXIN  Take 1 tablet (500 mg total) by mouth every 6 (six) hours as needed for muscle spasms.     ondansetron 4 MG disintegrating tablet  Commonly known as:  ZOFRAN ODT  Take 1 tablet (4 mg total) by mouth every 8 (eight) hours as needed.     oxyCODONE-acetaminophen 10-325 MG per tablet  Commonly known as:  PERCOCET  Take 1 tablet by mouth every 6 (six) hours as needed.       polyethylene glycol packet  Commonly known as:  MIRALAX / GLYCOLAX  Take 17 g by mouth daily.     sitaGLIPtin 100 MG tablet  Commonly known as:  JANUVIA  Take 100 mg by mouth daily.        Diagnostic Studies: Dg Chest 2 View  04/16/2014   CLINICAL DATA:  Preop for cervical fusion, hypertension, smoker  EXAM: CHEST  2 VIEW  COMPARISON:  None.  FINDINGS: Cardiomediastinal silhouette is unremarkable. No acute infiltrate or pleural effusion. No pulmonary edema. Mild degenerative changes lower thoracic and upper lumbar spine.  IMPRESSION: No active cardiopulmonary disease.   Electronically Signed   By: Lahoma Crocker M.D.   On: 04/16/2014 09:31   Dg Cervical Spine 2 Or 3 Views  04/17/2014   CLINICAL DATA:  Postop cervical spine fusion  EXAM: CERVICAL SPINE - 2-3 VIEW  COMPARISON:  C-spine intraoperative films of 04/16/2014  FINDINGS: The cervical vertebrae are straightened in alignment. Anterior fusion has been performed at C5-6 and C6-7. The interbody fusion plugs are in good position although the C7-T1 level is not well seen on plain films. There is prominent prevertebral soft tissue noted from C3 to the operative level most consistent with postoperative hematoma. The lung apices are clear.  IMPRESSION: 1. Anterior fusion at C5-6 and C6-7 with straightened alignment. 2. Prominent prevertebral soft tissue may represent postoperative hematoma. Correlate clinically.   Electronically Signed   By: Ivar Drape M.D.   On: 04/17/2014 08:13   Dg Cervical Spine Complete  04/16/2014   CLINICAL DATA:  Status post anterior cervical disc fusion.  EXAM: DG C-ARM 61-120 MIN; CERVICAL SPINE - COMPLETE 4+ VIEW  COMPARISON:  None.  FINDINGS: Four intraoperative fluoroscopic images of the cervical spine were submitted for review. These images demonstrate the patient to be status post surgical anterior fusion of C5, C6 and C7 with interbody spacers. Good alignment of the vertebral bodies is noted.  IMPRESSION: Status post  surgical anterior fusion of C5, C6 and C7.   Electronically Signed   By: Sabino Dick M.D.   On: 04/16/2014 16:11   Dg C-arm 1-60 Min  04/16/2014   CLINICAL DATA:  Status post anterior cervical disc fusion.  EXAM: DG C-ARM 61-120 MIN; CERVICAL SPINE - COMPLETE 4+ VIEW  COMPARISON:  None.  FINDINGS: Four intraoperative fluoroscopic images of the cervical spine were submitted for review. These images demonstrate the patient to  be status post surgical anterior fusion of C5, C6 and C7 with interbody spacers. Good alignment of the vertebral bodies is noted.  IMPRESSION: Status post surgical anterior fusion of C5, C6 and C7.   Electronically Signed   By: Sabino Dick M.D.   On: 04/16/2014 16:11          Follow-up Information   Schedule an appointment as soon as possible for a visit with Dahlia Bailiff, MD. (need return office visit 2 weeks postop)    Specialty:  Orthopedic Surgery   Contact information:   [address] [place] [address] [place] [address] [phone]       Discharge Plan:  discharge to home  Disposition:     Signed: Lanae Crumbly for Dr. Melina Schools [facility] [phone] 04/17/2014, 10:53 AM

## 2014-04-17 NOTE — Discharge Instructions (Signed)

## 2014-04-17 NOTE — Addendum Note (Signed)
Addendum created 04/17/14 2023 by Clearnce Sorrel, CRNA   Modules edited: Anesthesia Medication Administration

## 2014-04-17 NOTE — Progress Notes (Signed)
Utilization Review Completed.Henry Moore T9/18/2015  

## 2014-04-17 NOTE — Progress Notes (Signed)
Discharge home. Home discharge instruction given to patient, no questions verbalized. Aspen collar in place.

## 2014-04-17 NOTE — Op Note (Signed)
Henry Moore, Henry Moore NO.:  [id number]  MEDICAL RECORD NO.:  [id number]  LOCATION:  [id number]                        FACILITY:  [facility]  PHYSICIAN:  Dahlia Bailiff, MD    DATE OF BIRTH:  07-20-1961  DATE OF PROCEDURE:  04/16/2014 DATE OF DISCHARGE:                              OPERATIVE REPORT   FIRST ASSISTANT:  Alyson Locket. Velora Heckler.  He used instrumental in assisting with retraction, visualization, and wound closure.  PREOPERATIVE DIAGNOSIS:  Cervical spondylitic radiculopathy, right side, C5-6 and C6-7.  POSTOPERATIVE DIAGNOSIS:  Cervical spondylitic radiculopathy, right side, C5-6 and C6-7.  OPERATIVE PROCEDURE:  Anterior cervical diskectomy and fusion.  COMPLICATIONS:  None.  CONDITION:  Stable.  INSTRUMENTATION SYSTEM USED:  Titan Titanium intervertebral cages, size 8, medium, packed with DBX Mix and a 32-mm contoured anterior cervical DePuy skyline plate.  HISTORY:  This is a very pleasant 53 year old gentleman who has been complaining of significant neck and radicular arm pain.  Despite appropriate conservative management, it continue to deteriorate.  As a result, we elected to proceed with surgery.  All appropriate risks, benefits, and alternatives were discussed with the patient and consent was obtained.  OPERATIVE NOTE:  The patient was brought to the operating room and placed supine on the operating table.  After successful induction of general anesthesia and endotracheal intubation, TEDs and SCDs were applied, and roll towels were placed between the shoulder blades and the anterior cervical spine was prepped and draped in a standard fashion. Time-out was taken to confirm the patient, procedure and all other pertinent important data.  Once this was done, a transverse incision was made, centered over the C6 vertebral body.  I sharply dissected down to the platysma, which was sharply incised.  I then continued dissection along the medial border  of the sternocleidomastoid down through the deep cervical and prevertebral fascia.  I then swept the trachea and esophagus to the right, protected it with retractor, identified and protected the carotid sheath with finger.  I then used Kittner dissectors to remove the remaining deep cervical prevertebral fascia to expose the anterior longitudinal ligament.  Needles were placed into the disk space at C5-6 and I confirmed the appropriate level.  I then used bipolar electrocautery to mobilize the longus colli muscles from the midbody of C5 to the midbody of C7.  Self-retaining retractors were placed underneath the longus colli muscle, and the retraction was applied.  The endotracheal cuff was deflated for this and then reinflated after resetting the retraction.  I then placed distraction pins into the bodies of C6 and C7.  Gently distracted the disk space. An annulotomy was performed with a 15-blade scalpel.  Then, using a combination of pituitary rongeurs, curettes and Kerrison rongeurs, I resected all the disk material.  I made sure to remove all the cartilaginous endplate from the vertebral bodies.  I then removed the posterior osteophytes from the vertebral bodies of 6 and 7, and then resected a large osteophyte that was in the posterolateral right side consistent with what was seen on his preoperative MRI.  I then used the series of fine nerve hooks to develop a plane underneath the  posterior longitudinal ligament.  I then resected the PLL and exposed the thecal sac.  Once I had an adequate decompression, I then rasped the endplates, irrigated it copiously with normal saline and then placed the appropriate size 8 Titan Titanium intervertebral cage, packed with DBX Mix.  I then removed the distraction pins from C7 and placed it into C5 and then using the same technique, performed an ACDF at C5-6.  Again, I took down the posterior longitudinal ligament and rasped the endplates and placed  the same size cage.  An anterior cervical plate was then contoured and secured with 16 mm screws into the body of C6 and C5 and 14 mm screws into C6 and C7.  All screws had excellent purchase.  There was no adverse intraoperative events.  At the end of the case, all needle and sponge counts were correct.  The wound was copiously irrigated with normal saline.  Hemostasis was obtained and then, I closed the platysma with interrupted 2-0 Vicryl sutures.  The skin was then closed with 3-0 Monocryl.  Steri-Strips and dry dressing were applied.  The patient was ultimately extubated, transferred to the PACU without incident.  At the end of the case, all needle and sponge counts were correct.  There was no adverse intraoperative events.     Dahlia Bailiff, MD     DDB/MEDQ  D:  04/16/2014  T:  04/17/2014  Job:  [id number]

## 2014-04-17 NOTE — Evaluation (Signed)
Occupational Therapy Evaluation and Discharge Patient Details Name: Henry Moore MRN: [id number] DOB: Jan 30, 1961 Today's Date: 04/17/2014    History of Present Illness Anterior cervical diskectomy and fusion   Clinical Impression   This 54 yo male admitted and underwent above presents to acute OT with all education completed, we will sign off. No PT needs identified and they were made aware.    Follow Up Recommendations  No OT follow up    Equipment Recommendations  None recommended by OT       Precautions / Restrictions Precautions Precautions: Cervical Precaution Booklet Issued: Yes (comment) Precaution Comments: can only carry the poundage of a gallon milk close to body Required Braces or Orthoses: Cervical Brace Cervical Brace: Hard collar (only off to eat) Restrictions Weight Bearing Restrictions: No      Mobility Bed Mobility Overal bed mobility: Modified Independent                Transfers Overall transfer level: Modified independent               General transfer comment: Pt went up and down 3 steps in a step to pattern at a Mod I level         ADL Overall ADL's : Modified independent                                       General ADL Comments: Went over with him and his mom/dad how to change out pads on c-collar and how to adjust c-collar horizontally and vertically as well as how to care for the pads. I also went over to use a cup to spit in when brushing teeth instead of bending over the sink               Pertinent Vitals/Pain Pain Assessment: 0-10 Pain Score: 2  Pain Location: neck Pain Descriptors / Indicators: Aching Pain Intervention(s): Monitored during session     Hand Dominance Left   Extremity/Trunk Assessment Upper Extremity Assessment Upper Extremity Assessment:  ((hand and bicep/tricep strength 3+-4/5), pt reports decreased sensation in the median nerve distribution of both hands (he had this  prior to surgery--had been going on longer in right hand than left hand))           Communication Communication Communication: No difficulties   Cognition Arousal/Alertness: Awake/alert Behavior During Therapy: WFL for tasks assessed/performed Overall Cognitive Status: Within Functional Limits for tasks assessed                        Exercises   Other Exercises Other Exercises: Gave pt a handout for him to do wrist, forearm, and bicep curls with a [place] soup can progressing to a Progresso soup can. He is to do all of these in sitting.       Home Living Family/patient expects to be discharged to:: Private residence Living Arrangements: Spouse/significant other Available Help at Discharge: Family Type of Home: House Home Access: Stairs to enter Technical brewer of Steps: 2 Entrance Stairs-Rails: None Home Layout: One level     Bathroom Shower/Tub: Tub/shower unit;Curtain Shower/tub characteristics: Architectural technologist: Standard     Home Equipment: Hand held shower head          Prior Functioning/Environment Level of Independence: Independent  OT Goals(Current goals can be found in the care plan section) Acute Rehab OT Goals Patient Stated Goal: Home today  OT Frequency:                End of Session Equipment Utilized During Treatment: Cervical collar Nurse Communication:  (Pt ready to go from a therapy standpoint)  Activity Tolerance: Patient tolerated treatment well Patient left: in chair;with call bell/phone within reach;with family/visitor present   Time: 4401-0272 OT Time Calculation (min): 29 min Charges:  OT General Charges $OT Visit: 1 Procedure OT Evaluation $Initial OT Evaluation Tier I: 1 Procedure OT Treatments $Self Care/Home Management : 23-37 mins G-Codes: OT G-codes **NOT FOR INPATIENT CLASS** Functional Assessment Tool Used: Clinical observation Functional Limitation: Self care Self  Care Current Status (Z3664): At least 1 percent but less than 20 percent impaired, limited or restricted Self Care Goal Status (Q0347): At least 1 percent but less than 20 percent impaired, limited or restricted Self Care Discharge Status [phone]): At least 1 percent but less than 20 percent impaired, limited or restricted  Almon Register [phone] 04/17/2014, 12:12 PM

## 2014-04-17 NOTE — Progress Notes (Signed)
PT Cancellation Note  Patient Details Name: Ormand Senn Lennartz MRN: [id number] DOB: 21-Dec-1960   Cancelled Treatment:    Reason Eval/Treat Not Completed: PT screened, no needs identified, will sign off   Spoke with occupational therapy after initial evaluation. OT reports patient is functioning at a high level of independence and no physical therapy is indicated at this time. PT is signing-off. Please re-order if there is any significant change in status. Thank you for this referral.  Elayne Snare, [place]      Ellouise Newer 04/17/2014, 11:10 AM

## 2014-04-17 NOTE — Progress Notes (Signed)
    Subjective: Procedure(s) (LRB): ANTERIOR CERVICAL DECOMPRESSION/DISCECTOMY FUSION  (ACDF C5-C7)   (2 LEVELS)      (N/A) 1 Day Post-Op  Patient reports pain as 2 on 0-10 scale.  Reports decreased arm pain reports incisional neck pain   Positive void Negative bowel movement Positive flatus Negative chest pain or shortness of breath  Objective: Vital signs in last 24 hours: Temp:  [36.8 C-37.1 C] 36.9 C (09/18 0259) Pulse Rate:  [63-76] 63 (09/18 0259) Resp:  [13-20] 16 (09/18 0259) BP: (120-143)/(62-78) 133/68 mmHg (09/18 0259) SpO2:  [96 %-100 %] 100 % (09/18 0259) Weight:  [240 lb (108.863 kg)] 240 lb (108.863 kg) (09/17 0907)  Intake/Output from previous day: 09/17 0701 - 09/18 0700 In: 2660 [P.O.:360; I.V.:2200] Out: 1440 [Urine:1400; Blood:40]  Labs:  Recent Labs  04/16/14 0916  WBC 3.6*  RBC 3.99*  HCT 37.3*  PLT 63*    Recent Labs  04/16/14 0916 04/17/14 0545  NA 132* 131*  K 4.4 4.9  CL 96 97  CO2 20 21  BUN 20 21  CREATININE 1.12 0.98  GLUCOSE 182* 166*  CALCIUM 9.3 8.8   No results found for this basename: LABPT, INR,  in the last 72 hours  Physical Exam: Neurologically intact ABD soft Intact pulses distally Incision: dressing C/D/I Compartment soft  Assessment/Plan: Patient stable  xrays satisfactory Mobilization with physical therapy Encourage incentive spirometry Continue care  Advance diet Up with therapy D/C IV fluids Plan on d/c today  Melina Schools, MD [place] [phone]

## 2014-04-18 NOTE — Discharge Summary (Signed)
Agree with above F/u 2 weeks

## 2014-04-20 ENCOUNTER — Encounter (HOSPITAL_COMMUNITY): Payer: Self-pay | Admitting: Orthopedic Surgery

## 2015-05-13 ENCOUNTER — Ambulatory Visit (INDEPENDENT_AMBULATORY_CARE_PROVIDER_SITE_OTHER): Payer: BC Managed Care – PPO | Admitting: Family Medicine

## 2015-05-13 ENCOUNTER — Encounter: Payer: Self-pay | Admitting: Family Medicine

## 2015-05-13 VITALS — BP 102/67 | HR 71 | Temp 98.6°F | Ht 71.0 in | Wt 240.0 lb

## 2015-05-13 DIAGNOSIS — Z23 Encounter for immunization: Secondary | ICD-10-CM | POA: Diagnosis not present

## 2015-05-13 DIAGNOSIS — I10 Essential (primary) hypertension: Secondary | ICD-10-CM | POA: Insufficient documentation

## 2015-05-13 DIAGNOSIS — R0989 Other specified symptoms and signs involving the circulatory and respiratory systems: Secondary | ICD-10-CM | POA: Diagnosis not present

## 2015-05-13 DIAGNOSIS — E785 Hyperlipidemia, unspecified: Secondary | ICD-10-CM | POA: Insufficient documentation

## 2015-05-13 DIAGNOSIS — E1140 Type 2 diabetes mellitus with diabetic neuropathy, unspecified: Secondary | ICD-10-CM | POA: Diagnosis not present

## 2015-05-13 DIAGNOSIS — E119 Type 2 diabetes mellitus without complications: Secondary | ICD-10-CM | POA: Insufficient documentation

## 2015-05-13 LAB — LIPID PANEL PICCOLO, WAIVED
Chol/HDL Ratio Piccolo,Waive: 3.6 mg/dL
Cholesterol Piccolo, Waived: 122 mg/dL (ref <200)
HDL Chol Piccolo, Waived: 34 mg/dL — ABNORMAL LOW (ref >59)
LDL Chol Calc Piccolo Waived: 69 mg/dL (ref <100)
Triglycerides Piccolo,Waived: 94 mg/dL (ref <150)
VLDL Chol Calc Piccolo,Waive: 19 mg/dL (ref <30)

## 2015-05-13 LAB — BAYER DCA HB A1C WAIVED: HB A1C (BAYER DCA - WAIVED): 6.5 % (ref <7.0)

## 2015-05-13 MED ORDER — LOVASTATIN 40 MG PO TABS
40.0000 mg | ORAL_TABLET | Freq: Every day | ORAL | Status: DC
Start: 2015-05-13 — End: 2016-03-15

## 2015-05-13 MED ORDER — CARVEDILOL 25 MG PO TABS: 50.0000 mg | ORAL_TABLET | Freq: Two times a day (BID) | ORAL | Status: DC

## 2015-05-13 MED ORDER — SITAGLIPTIN PHOSPHATE 100 MG PO TABS: 100.0000 mg | ORAL_TABLET | Freq: Every day | ORAL | Status: DC

## 2015-05-13 MED ORDER — AMLODIPINE BESYLATE 10 MG PO TABS: 10.0000 mg | ORAL_TABLET | Freq: Every day | ORAL | Status: DC

## 2015-05-13 MED ORDER — CANAGLIFLOZIN 300 MG PO TABS: 300.0000 mg | ORAL_TABLET | Freq: Every day | ORAL | Status: DC

## 2015-05-13 MED ORDER — LOSARTAN POTASSIUM-HCTZ 100-25 MG PO TABS: 1.0000 | ORAL_TABLET | Freq: Every day | ORAL | Status: DC

## 2015-05-13 NOTE — Progress Notes (Signed)
BP 102/67 mmHg  Pulse 71  Temp(Src) 98.6 F (37 C)  Ht 5\' 11"  (1.803 m)  Wt 240 lb (108.863 kg)  BMI 33.49 kg/m2  SpO2 99%   Subjective:    Patient ID: Henry Moore, male    DOB: [birth year], 54 y.o.   MRN: [id number]  HPI: Henry Moore is a 54 y.o. male  Chief Complaint  Patient presents with  . Diabetes  . Hyperlipidemia  . Hypertension   patient having multiple problems other than noted above. Has been through multiple surgical procedures on his neck with what sounds like unsatisfactory results and a 20% permanent partial disability rating. Patient also with shoulder bothered during this time and just had shoulder surgery. As a consequence of all this patient's no longer able to work at his job. Patient also with numbness in his feet had been very mild over the last years but getting much worse over this summer and fall. Doesn't complain of cramps in his legs with exertion but they seem to be cold.  Relevant past medical, surgical, family and social history reviewed and updated as indicated. Interim medical history since our last visit reviewed. Allergies and medications reviewed and updated.  Review of Systems  Constitutional: Positive for activity change. Negative for fever.  Respiratory: Negative.   Cardiovascular: Negative.     Per HPI unless specifically indicated above     Objective:    BP 102/67 mmHg  Pulse 71  Temp(Src) 98.6 F (37 C)  Ht 5\' 11"  (1.803 m)  Wt 240 lb (108.863 kg)  BMI 33.49 kg/m2  SpO2 99%  Wt Readings from Last 3 Encounters:  05/13/15 240 lb (108.863 kg)  11/24/14 251 lb (113.853 kg)  04/16/14 240 lb (108.863 kg)    Physical Exam  Constitutional: He is oriented to person, place, and time. He appears well-developed and well-nourished. No distress.  HENT:  Head: Normocephalic and atraumatic.  Right Ear: Hearing normal.  Left Ear: Hearing normal.  Nose: Nose normal.  Eyes: Conjunctivae and lids are normal. Right eye  exhibits no discharge. Left eye exhibits no discharge. No scleral icterus.  Pulmonary/Chest: Effort normal. No respiratory distress.  Musculoskeletal: Normal range of motion.  Foot exam as noted pulses absent and diminished sensation distally.  Neurological: He is alert and oriented to person, place, and time.  Skin: Skin is intact. No rash noted.  Psychiatric: He has a normal mood and affect. His speech is normal and behavior is normal. Judgment and thought content normal. Cognition and memory are normal.    Results for orders placed or performed during the hospital encounter of 04/16/14  Surgical pcr screen  Result Value Ref Range   MRSA, PCR NEGATIVE NEGATIVE   Staphylococcus aureus NEGATIVE NEGATIVE  CBC  Result Value Ref Range   WBC 3.6 (L) 4.0 - 10.5 K/uL   RBC 3.99 (L) 4.22 - 5.81 MIL/uL   Hemoglobin 13.3 13.0 - 17.0 g/dL   HCT 37.3 (L) 39.0 - 52.0 %   MCV 93.5 78.0 - 100.0 fL   MCH 33.3 26.0 - 34.0 pg   MCHC 35.7 30.0 - 36.0 g/dL   RDW 13.0 11.5 - 15.5 %   Platelets 63 (L) 150 - 400 K/uL  Basic metabolic panel  Result Value Ref Range   Sodium 132 (L) 137 - 147 mEq/L   Potassium 4.4 3.7 - 5.3 mEq/L   Chloride 96 96 - 112 mEq/L   CO2 20 19 - 32 mEq/L  Glucose, Bld 182 (H) 70 - 99 mg/dL   BUN 20 6 - 23 mg/dL   Creatinine, Ser 1.12 0.50 - 1.35 mg/dL   Calcium 9.3 8.4 - 10.5 mg/dL   GFR calc non Af Amer 74 (L) >90 mL/min   GFR calc Af Amer 86 (L) >90 mL/min   Anion gap 16 (H) 5 - 15  Glucose, capillary  Result Value Ref Range   Glucose-Capillary 193 (H) 70 - 99 mg/dL  Glucose, capillary  Result Value Ref Range   Glucose-Capillary 166 (H) 70 - 99 mg/dL  Glucose, capillary  Result Value Ref Range   Glucose-Capillary 159 (H) 70 - 99 mg/dL  Basic metabolic panel  Result Value Ref Range   Sodium 131 (L) 137 - 147 mEq/L   Potassium 4.9 3.7 - 5.3 mEq/L   Chloride 97 96 - 112 mEq/L   CO2 21 19 - 32 mEq/L   Glucose, Bld 166 (H) 70 - 99 mg/dL   BUN 21 6 - 23 mg/dL    Creatinine, Ser 0.98 0.50 - 1.35 mg/dL   Calcium 8.8 8.4 - 10.5 mg/dL   GFR calc non Af Amer >90 >90 mL/min   GFR calc Af Amer >90 >90 mL/min   Anion gap 13 5 - 15  Glucose, capillary  Result Value Ref Range   Glucose-Capillary 170 (H) 70 - 99 mg/dL  Glucose, capillary  Result Value Ref Range   Glucose-Capillary 226 (H) 70 - 99 mg/dL  Glucose, capillary  Result Value Ref Range   Glucose-Capillary 179 (H) 70 - 99 mg/dL  Glucose, capillary  Result Value Ref Range   Glucose-Capillary 203 (H) 70 - 99 mg/dL      Assessment & Plan:   Problem List Items Addressed This Visit      Cardiovascular and Mediastinum   Essential hypertension, benign    The current medical regimen is effective;  continue present plan and medications.       Relevant Medications   aspirin EC 81 MG tablet   amLODipine (NORVASC) 10 MG tablet   carvedilol (COREG) 25 MG tablet   losartan-hydrochlorothiazide (HYZAAR) 100-25 MG tablet   lovastatin (MEVACOR) 40 MG tablet   Other Relevant Orders   Lipid Panel Piccolo, Waived   Comprehensive metabolic panel   CBC with Differential/Platelet   Bayer DCA Hb A1c Waived     Endocrine   Type 2 diabetes, controlled, with neuropathy (HCC)   Relevant Medications   aspirin EC 81 MG tablet   canagliflozin (INVOKANA) 300 MG TABS tablet   losartan-hydrochlorothiazide (HYZAAR) 100-25 MG tablet   lovastatin (MEVACOR) 40 MG tablet   sitaGLIPtin (JANUVIA) 100 MG tablet     Other   Hyperlipemia    The current medical regimen is effective;  continue present plan and medications.       Relevant Medications   aspirin EC 81 MG tablet   amLODipine (NORVASC) 10 MG tablet   carvedilol (COREG) 25 MG tablet   losartan-hydrochlorothiazide (HYZAAR) 100-25 MG tablet   lovastatin (MEVACOR) 40 MG tablet   Other Relevant Orders   Lipid Panel Piccolo, Waived   Comprehensive metabolic panel   CBC with Differential/Platelet   Bayer DCA Hb A1c Waived   Diminished pulses in lower  extremity - Primary    Patient with worsening neuropathy and on exam diminished peripheral pulses With history of diabetes and wrist to legs will refer to vascular to further evaluate for peripheral artery disease.      Relevant Orders  Ambulatory referral to Vascular Surgery    Other Visit Diagnoses    Immunization due        Relevant Orders    Flu Vaccine QUAD 36+ mos PF IM (Fluarix & Fluzone Quad PF) (Completed)        Follow up plan: Return in about 3 months (around 08/13/2015), or Earlier pending leg evaluation, for A1c.

## 2015-05-13 NOTE — Assessment & Plan Note (Signed)
Patient with worsening neuropathy and on exam diminished peripheral pulses With history of diabetes and wrist to legs will refer to vascular to further evaluate for peripheral artery disease.

## 2015-05-13 NOTE — Assessment & Plan Note (Signed)
The current medical regimen is effective;  continue present plan and medications.  

## 2015-05-14 LAB — COMPREHENSIVE METABOLIC PANEL
ALT: 42 IU/L (ref 0–44)
AST: 44 IU/L — ABNORMAL HIGH (ref 0–40)
Albumin/Globulin Ratio: 1.4 (ref 1.1–2.5)
Albumin: 4.3 g/dL (ref 3.5–5.5)
Alkaline Phosphatase: 120 IU/L — ABNORMAL HIGH (ref 39–117)
BUN/Creatinine Ratio: 12 (ref 9–20)
BUN: 14 mg/dL (ref 6–24)
Bilirubin Total: 0.9 mg/dL (ref 0.0–1.2)
CO2: 22 mmol/L (ref 18–29)
Calcium: 9.2 mg/dL (ref 8.7–10.2)
Chloride: 98 mmol/L (ref 97–108)
Creatinine, Ser: 1.16 mg/dL (ref 0.76–1.27)
GFR calc Af Amer: 83 mL/min/{1.73_m2} (ref >59)
GFR calc non Af Amer: 72 mL/min/{1.73_m2} (ref >59)
Globulin, Total: 3.1 g/dL (ref 1.5–4.5)
Glucose: 168 mg/dL — ABNORMAL HIGH (ref 65–99)
Potassium: 4.3 mmol/L (ref 3.5–5.2)
Sodium: 135 mmol/L (ref 134–144)
Total Protein: 7.4 g/dL (ref 6.0–8.5)

## 2015-05-14 LAB — CBC WITH DIFFERENTIAL/PLATELET
Basophils Absolute: 0 10*3/uL (ref 0.0–0.2)
Basos: 1 %
EOS (ABSOLUTE): 0.2 10*3/uL (ref 0.0–0.4)
Eos: 6 %
Hematocrit: 38.9 % (ref 37.5–51.0)
Hemoglobin: 13.2 g/dL (ref 12.6–17.7)
Immature Grans (Abs): 0 10*3/uL (ref 0.0–0.1)
Immature Granulocytes: 0 %
Lymphocytes Absolute: 0.8 10*3/uL (ref 0.7–3.1)
Lymphs: 21 %
MCH: 33.8 pg — ABNORMAL HIGH (ref 26.6–33.0)
MCHC: 33.9 g/dL (ref 31.5–35.7)
MCV: 100 fL — ABNORMAL HIGH (ref 79–97)
Monocytes Absolute: 0.3 10*3/uL (ref 0.1–0.9)
Monocytes: 9 %
Neutrophils Absolute: 2.3 10*3/uL (ref 1.4–7.0)
Neutrophils: 63 %
Platelets: 81 10*3/uL — CL (ref 150–379)
RBC: 3.9 x10E6/uL — ABNORMAL LOW (ref 4.14–5.80)
RDW: 13.5 % (ref 12.3–15.4)
WBC: 3.6 10*3/uL (ref 3.4–10.8)

## 2015-05-17 ENCOUNTER — Encounter: Payer: Self-pay | Admitting: Family Medicine

## 2015-08-04 ENCOUNTER — Encounter: Payer: Self-pay | Admitting: Family Medicine

## 2015-08-04 ENCOUNTER — Ambulatory Visit (INDEPENDENT_AMBULATORY_CARE_PROVIDER_SITE_OTHER): Payer: BC Managed Care – PPO | Admitting: Family Medicine

## 2015-08-04 VITALS — BP 119/77 | HR 74 | Temp 98.7°F | Ht 71.7 in | Wt 243.0 lb

## 2015-08-04 DIAGNOSIS — E785 Hyperlipidemia, unspecified: Secondary | ICD-10-CM

## 2015-08-04 DIAGNOSIS — E119 Type 2 diabetes mellitus without complications: Secondary | ICD-10-CM | POA: Diagnosis not present

## 2015-08-04 DIAGNOSIS — E1140 Type 2 diabetes mellitus with diabetic neuropathy, unspecified: Secondary | ICD-10-CM | POA: Diagnosis not present

## 2015-08-04 DIAGNOSIS — I10 Essential (primary) hypertension: Secondary | ICD-10-CM | POA: Diagnosis not present

## 2015-08-04 NOTE — Progress Notes (Signed)
BP 119/77 mmHg  Pulse 74  Temp(Src) 98.7 F (37.1 C)  Ht 5' 11.7" (1.821 m)  Wt 243 lb (110.224 kg)  BMI 33.24 kg/m2  SpO2 99%   Subjective:    Patient ID: Henry Moore, male    DOB: Jan 31, 1961, 55 y.o.   MRN: [id number]  HPI: Henry Moore is a 55 y.o. male  Chief Complaint  Patient presents with  . Diabetes   patient doing well with Invokanna and Januvia no side effects noted low blood sugar spells good control patient's lost 8 pounds this year and has done well. Cholesterol doing well with medications Blood pressure doing well takes medications faithfully with no side effects She also has a lot of back issues concerns and what sounds like disability. Patient's been seeing a bunch of doctors in [place] in [place] regarding a Worker's Comp. claim around his back. Wants disability papers and handicap stickers filled out. On chart review extensively here I don't find anything from any of these doctors other than a few orders. I discussed with patient that we need information to assess this information and he will start tracking down these results to get to [place].   Relevant past medical, surgical, family and social history reviewed and updated as indicated. Interim medical history since our last visit reviewed. Allergies and medications reviewed and updated.  Review of Systems  Constitutional: Negative.   Respiratory: Negative.   Cardiovascular: Negative.     Per HPI unless specifically indicated above     Objective:    BP 119/77 mmHg  Pulse 74  Temp(Src) 98.7 F (37.1 C)  Ht 5' 11.7" (1.821 m)  Wt 243 lb (110.224 kg)  BMI 33.24 kg/m2  SpO2 99%  Wt Readings from Last 3 Encounters:  08/04/15 243 lb (110.224 kg)  05/13/15 240 lb (108.863 kg)  11/24/14 251 lb (113.853 kg)    Physical Exam  Constitutional: He is oriented to person, place, and time. He appears well-developed and well-nourished. No distress.  HENT:  Head: Normocephalic and  atraumatic.  Right Ear: Hearing normal.  Left Ear: Hearing normal.  Nose: Nose normal.  Eyes: Conjunctivae and lids are normal. Right eye exhibits no discharge. Left eye exhibits no discharge. No scleral icterus.  Cardiovascular: Normal rate, regular rhythm and normal heart sounds.   Pulmonary/Chest: Effort normal and breath sounds normal. No respiratory distress.  Musculoskeletal: Normal range of motion.  Neurological: He is alert and oriented to person, place, and time.  Skin: Skin is intact. No rash noted.  Psychiatric: He has a normal mood and affect. His speech is normal and behavior is normal. Judgment and thought content normal. Cognition and memory are normal.    Results for orders placed or performed in visit on 05/13/15  Lipid Panel Piccolo, [employer]  Result Value Ref Range   Cholesterol Piccolo, Waived 122 <200 mg/dL   HDL Chol Piccolo, Waived 34 (L) >59 mg/dL   Triglycerides Piccolo,Waived 94 <150 mg/dL   Chol/HDL Ratio Piccolo,Waive 3.6 mg/dL   LDL Chol Calc Piccolo Waived 69 <100 mg/dL   VLDL Chol Calc Piccolo,Waive 19 <30 mg/dL  Comprehensive metabolic panel  Result Value Ref Range   Glucose 168 (H) 65 - 99 mg/dL   BUN 14 6 - 24 mg/dL   Creatinine, Ser 1.16 0.76 - 1.27 mg/dL   GFR calc non Af Amer 72 >59 mL/min/1.73   GFR calc Af Amer 83 >59 mL/min/1.73   BUN/Creatinine Ratio 12 9 - 20  Sodium 135 134 - 144 mmol/L   Potassium 4.3 3.5 - 5.2 mmol/L   Chloride 98 97 - 108 mmol/L   CO2 22 18 - 29 mmol/L   Calcium 9.2 8.7 - 10.2 mg/dL   Total Protein 7.4 6.0 - 8.5 g/dL   Albumin 4.3 3.5 - 5.5 g/dL   Globulin, Total 3.1 1.5 - 4.5 g/dL   Albumin/Globulin Ratio 1.4 1.1 - 2.5   Bilirubin Total 0.9 0.0 - 1.2 mg/dL   Alkaline Phosphatase 120 (H) 39 - 117 IU/L   AST 44 (H) 0 - 40 IU/L   ALT 42 0 - 44 IU/L  CBC with Differential/Platelet  Result Value Ref Range   WBC 3.6 3.4 - 10.8 x10E3/uL   RBC 3.90 (L) 4.14 - 5.80 x10E6/uL   Hemoglobin 13.2 12.6 - 17.7 g/dL    Hematocrit 38.9 37.5 - 51.0 %   MCV 100 (H) 79 - 97 fL   MCH 33.8 (H) 26.6 - 33.0 pg   MCHC 33.9 31.5 - 35.7 g/dL   RDW 13.5 12.3 - 15.4 %   Platelets 81 (LL) 150 - 379 x10E3/uL   Neutrophils 63 %   Lymphs 21 %   Monocytes 9 %   Eos 6 %   Basos 1 %   Neutrophils Absolute 2.3 1.4 - 7.0 x10E3/uL   Lymphocytes Absolute 0.8 0.7 - 3.1 x10E3/uL   Monocytes Absolute 0.3 0.1 - 0.9 x10E3/uL   EOS (ABSOLUTE) 0.2 0.0 - 0.4 x10E3/uL   Basophils Absolute 0.0 0.0 - 0.2 x10E3/uL   Immature Granulocytes 0 %   Immature Grans (Abs) 0.0 0.0 - 0.1 x10E3/uL   Hematology Comments: Note:   Bayer DCA Hb A1c Waived  Result Value Ref Range   Bayer DCA Hb A1c Waived 6.5 <7.0 %      Assessment & Plan:   Problem List Items Addressed This Visit      Cardiovascular and Mediastinum   Essential hypertension, benign    The current medical regimen is effective;  continue present plan and medications.         Endocrine   Type 2 diabetes, controlled, with neuropathy ([place])    The current medical regimen is effective;  continue present plan and medications.         Other   Hyperlipemia    The current medical regimen is effective;  continue present plan and medications.        Other Visit Diagnoses    Diabetes mellitus without complication ([place])    -  Primary    Relevant Orders    Bayer DCA Hb A1c Waived        Discuss for disability review etc. would need a separate office visit Follow up plan: Return in about 3 months (around 11/02/2015), or if symptoms worsen or fail to improve, for Physical Exam.

## 2015-08-04 NOTE — Assessment & Plan Note (Signed)
The current medical regimen is effective;  continue present plan and medications.  

## 2015-08-05 ENCOUNTER — Encounter: Payer: Self-pay | Admitting: Family Medicine

## 2015-08-05 LAB — BAYER DCA HB A1C WAIVED: HB A1C (BAYER DCA - WAIVED): 6 % (ref <7.0)

## 2015-09-08 ENCOUNTER — Other Ambulatory Visit: Payer: Self-pay | Admitting: Family Medicine

## 2015-09-08 MED ORDER — EMPAGLIFLOZIN 25 MG PO TABS: 25.0000 mg | ORAL_TABLET | Freq: Every day | ORAL | Status: DC

## 2015-11-25 ENCOUNTER — Encounter: Payer: BC Managed Care – PPO | Admitting: Family Medicine

## 2015-12-20 ENCOUNTER — Telehealth: Payer: Self-pay | Admitting: Family Medicine

## 2015-12-20 NOTE — Telephone Encounter (Signed)
Pt no longer has insurance and would like to know if there is another medication he can take instead of empagliflozin (JARDIANCE) 25 MG TABS tablet($400 a month) and sitaGLIPtin (JANUVIA) 100 MG tablet. He is applying for disability but would like to know if he can change his rx for 3 months until he gets insurance again

## 2015-12-21 NOTE — Telephone Encounter (Signed)
Call pt 

## 2015-12-22 MED ORDER — GLIPIZIDE 5 MG PO TABS: 5.0000 mg | ORAL_TABLET | Freq: Two times a day (BID) | ORAL | Status: DC

## 2015-12-22 MED ORDER — METFORMIN HCL ER (MOD) 500 MG PO TB24: 1000.0000 mg | ORAL_TABLET | Freq: Two times a day (BID) | ORAL | Status: DC

## 2015-12-22 NOTE — Telephone Encounter (Signed)
Phone call Discussed with patient is noted below running out of money and out of insurance will try metformin and glipizide as patient's taking that in the past will use extended release because of GI toxicity.

## 2016-01-17 ENCOUNTER — Other Ambulatory Visit: Payer: Self-pay

## 2016-01-17 MED ORDER — AMLODIPINE BESYLATE 10 MG PO TABS: 10.0000 mg | ORAL_TABLET | Freq: Every day | ORAL | Status: DC

## 2016-01-17 MED ORDER — LOSARTAN POTASSIUM-HCTZ 100-25 MG PO TABS: 1.0000 | ORAL_TABLET | Freq: Every day | ORAL | Status: DC

## 2016-01-17 NOTE — Telephone Encounter (Signed)
Apt with Apolonio Schneiders

## 2016-01-25 ENCOUNTER — Encounter: Payer: Self-pay | Admitting: Family Medicine

## 2016-01-25 NOTE — Telephone Encounter (Signed)
Letter sent.

## 2016-03-15 ENCOUNTER — Ambulatory Visit (INDEPENDENT_AMBULATORY_CARE_PROVIDER_SITE_OTHER): Payer: Self-pay | Admitting: Family Medicine

## 2016-03-15 ENCOUNTER — Encounter: Payer: Self-pay | Admitting: Family Medicine

## 2016-03-15 VITALS — BP 117/72 | HR 71 | Temp 98.3°F | Ht 72.0 in | Wt 246.0 lb

## 2016-03-15 DIAGNOSIS — E785 Hyperlipidemia, unspecified: Secondary | ICD-10-CM

## 2016-03-15 DIAGNOSIS — I10 Essential (primary) hypertension: Secondary | ICD-10-CM

## 2016-03-15 DIAGNOSIS — Z981 Arthrodesis status: Secondary | ICD-10-CM

## 2016-03-15 DIAGNOSIS — E1140 Type 2 diabetes mellitus with diabetic neuropathy, unspecified: Secondary | ICD-10-CM

## 2016-03-15 LAB — HEMOGLOBIN A1C: Hemoglobin A1C: 6.5

## 2016-03-15 LAB — BAYER DCA HB A1C WAIVED: HB A1C (BAYER DCA - WAIVED): 6.5 % (ref <7.0)

## 2016-03-15 MED ORDER — CARVEDILOL 25 MG PO TABS: 50.0000 mg | ORAL_TABLET | Freq: Two times a day (BID) | ORAL | 7 refills | Status: DC

## 2016-03-15 MED ORDER — LOVASTATIN 40 MG PO TABS: 40.0000 mg | ORAL_TABLET | Freq: Every day | ORAL | 7 refills | Status: DC

## 2016-03-15 MED ORDER — AMLODIPINE BESYLATE 10 MG PO TABS: 10.0000 mg | ORAL_TABLET | Freq: Every day | ORAL | 7 refills | Status: DC

## 2016-03-15 MED ORDER — GLIPIZIDE 5 MG PO TABS: 5.0000 mg | ORAL_TABLET | Freq: Two times a day (BID) | ORAL | 7 refills | Status: DC

## 2016-03-15 MED ORDER — METFORMIN HCL ER (MOD) 500 MG PO TB24: 1000.0000 mg | ORAL_TABLET | Freq: Two times a day (BID) | ORAL | 7 refills | Status: DC

## 2016-03-15 MED ORDER — LOSARTAN POTASSIUM-HCTZ 100-25 MG PO TABS: 1.0000 | ORAL_TABLET | Freq: Every day | ORAL | 7 refills | Status: DC

## 2016-03-15 NOTE — Assessment & Plan Note (Signed)
The current medical regimen is effective;  continue present plan and medications.  

## 2016-03-15 NOTE — Assessment & Plan Note (Signed)
Chronic pain and inability to work

## 2016-03-15 NOTE — Progress Notes (Signed)
BP 117/72 (BP Location: Left Arm, Patient Position: Sitting, Cuff Size: Normal)   Pulse 71   Temp 98.3 F (36.8 C)   Ht 6' (1.829 m)   Wt 246 lb (111.6 kg)   SpO2 97%   BMI 33.36 kg/m    Subjective:    Patient ID: Henry Moore, male    DOB: 1960/11/07, 55 y.o.   MRN: [id number]  HPI: Henry Moore is a 55 y.o. male  Chief Complaint  Patient presents with  . Diabetes  . Hypertension  . Hyperlipidemia   Patient recheck diabetes has been trying to do better with diet A limited exercise because of chronic back pain which is resulting in his ability to not work Has disability paperwork to fill out which was done see copy for details Blood pressure cholesterol doing well no complaints from medications taken faithfully without side effects Continues to have life limiting neck and low back pain to the point where he can't do all his activities of daily living gets assistance with housework etc. Patient unable to work  Relevant past medical, surgical, family and social history reviewed and updated as indicated. Interim medical history since our last visit reviewed. Allergies and medications reviewed and updated.  Review of Systems  Constitutional: Negative.   Respiratory: Negative.   Cardiovascular: Negative.     Per HPI unless specifically indicated above     Objective:    BP 117/72 (BP Location: Left Arm, Patient Position: Sitting, Cuff Size: Normal)   Pulse 71   Temp 98.3 F (36.8 C)   Ht 6' (1.829 m)   Wt 246 lb (111.6 kg)   SpO2 97%   BMI 33.36 kg/m   Wt Readings from Last 3 Encounters:  03/15/16 246 lb (111.6 kg)  08/04/15 243 lb (110.2 kg)  05/13/15 240 lb (108.9 kg)    Physical Exam  Constitutional: He is oriented to person, place, and time. He appears well-developed and well-nourished. No distress.  HENT:  Head: Normocephalic and atraumatic.  Right Ear: Hearing normal.  Left Ear: Hearing normal.  Nose: Nose normal.  Eyes: Conjunctivae and  lids are normal. Right eye exhibits no discharge. Left eye exhibits no discharge. No scleral icterus.  Cardiovascular: Normal rate, regular rhythm and normal heart sounds.   Pulmonary/Chest: Effort normal and breath sounds normal. No respiratory distress.  Musculoskeletal: Normal range of motion.  Neurological: He is alert and oriented to person, place, and time.  Skin: Skin is intact. No rash noted.  Psychiatric: He has a normal mood and affect. His speech is normal and behavior is normal. Judgment and thought content normal. Cognition and memory are normal.    Results for orders placed or performed in visit on 03/15/16  Hemoglobin A1c  Result Value Ref Range   Hemoglobin A1C 6.5       Assessment & Plan:   Problem List Items Addressed This Visit      Cardiovascular and Mediastinum   Essential hypertension, benign - Primary    The current medical regimen is effective;  continue present plan and medications.       Relevant Medications   lovastatin (MEVACOR) 40 MG tablet   losartan-hydrochlorothiazide (HYZAAR) 100-25 MG tablet   carvedilol (COREG) 25 MG tablet   amLODipine (NORVASC) 10 MG tablet   Other Relevant Orders   Basic metabolic panel     Endocrine   Type 2 diabetes, controlled, with neuropathy ([place])    The current medical regimen is effective;  continue  present plan and medications.       Relevant Medications   metFORMIN (GLUMETZA) 500 MG (MOD) 24 hr tablet   lovastatin (MEVACOR) 40 MG tablet   losartan-hydrochlorothiazide (HYZAAR) 100-25 MG tablet   glipiZIDE (GLUCOTROL) 5 MG tablet   Other Relevant Orders   Bayer DCA Hb A1c Waived     Other   S/P cervical spinal fusion    Chronic pain and inability to work      Hyperlipemia    The current medical regimen is effective;  continue present plan and medications.       Relevant Medications   lovastatin (MEVACOR) 40 MG tablet   losartan-hydrochlorothiazide (HYZAAR) 100-25 MG tablet   carvedilol (COREG) 25  MG tablet   amLODipine (NORVASC) 10 MG tablet    Other Visit Diagnoses   None.      Follow up plan: Return in about 6 months (around 09/15/2016) for Physical Exam, Hemoglobin A1c.

## 2016-03-16 ENCOUNTER — Encounter: Payer: Self-pay | Admitting: Family Medicine

## 2016-03-16 LAB — BASIC METABOLIC PANEL
BUN/Creatinine Ratio: 18 (ref 9–20)
BUN: 19 mg/dL (ref 6–24)
CO2: 17 mmol/L — ABNORMAL LOW (ref 18–29)
Calcium: 9.4 mg/dL (ref 8.7–10.2)
Chloride: 101 mmol/L (ref 96–106)
Creatinine, Ser: 1.07 mg/dL (ref 0.76–1.27)
GFR calc Af Amer: 92 mL/min/{1.73_m2} (ref >59)
GFR calc non Af Amer: 79 mL/min/{1.73_m2} (ref >59)
Glucose: 160 mg/dL — ABNORMAL HIGH (ref 65–99)
Potassium: 4.3 mmol/L (ref 3.5–5.2)
Sodium: 137 mmol/L (ref 134–144)

## 2016-11-21 ENCOUNTER — Telehealth: Payer: Self-pay | Admitting: Family Medicine

## 2016-11-21 ENCOUNTER — Other Ambulatory Visit: Payer: Self-pay | Admitting: Family Medicine

## 2016-11-21 NOTE — Telephone Encounter (Signed)
Last OV: 03/15/16 Next OV: 12/06/16  From previous telephone encounter from front desk staff, "Patient is out of some of his diabetic meds but the first appt Dr Jeananne Rama had was 5/9.  Can Dr Jeananne Rama approve this medication until his appt on 5/9 if possible.  His pharmacy will be sending over the request for refills some time today."  BMP Latest Ref Rng & Units 03/15/2016 05/13/2015 04/17/2014  Glucose 65 - 99 mg/dL 160(H) 168(H) 166(H)  BUN 6 - 24 mg/dL 19 14 21   Creatinine 0.76 - 1.27 mg/dL 1.07 1.16 0.98  BUN/Creat Ratio 9 - 20 18 12  -  Sodium 134 - 144 mmol/L 137 135 131(L)  Potassium 3.5 - 5.2 mmol/L 4.3 4.3 4.9  Chloride 96 - 106 mmol/L 101 98 97  CO2 18 - 29 mmol/L 17(L) 22 21  Calcium 8.7 - 10.2 mg/dL 9.4 9.2 8.8

## 2016-11-21 NOTE — Telephone Encounter (Signed)
Patient is out of some of his diabetic meds but the first appt Dr Jeananne Rama had was 5/9.  Can Dr Jeananne Rama approve this medication until his appt on 5/9 if possible.  His pharmacy will be sending over the request for refills some time today.  Thanks

## 2016-11-21 NOTE — Telephone Encounter (Signed)
Will send refill medication once pharmacy sends request, so correct medication is sent to provider.

## 2016-12-06 ENCOUNTER — Encounter: Payer: Self-pay | Admitting: Family Medicine

## 2016-12-06 ENCOUNTER — Ambulatory Visit (INDEPENDENT_AMBULATORY_CARE_PROVIDER_SITE_OTHER): Payer: BC Managed Care – PPO | Admitting: Family Medicine

## 2016-12-06 VITALS — BP 131/84 | HR 75 | Ht 73.0 in | Wt 248.0 lb

## 2016-12-06 DIAGNOSIS — Z981 Arthrodesis status: Secondary | ICD-10-CM

## 2016-12-06 DIAGNOSIS — E785 Hyperlipidemia, unspecified: Secondary | ICD-10-CM | POA: Diagnosis not present

## 2016-12-06 DIAGNOSIS — I10 Essential (primary) hypertension: Secondary | ICD-10-CM

## 2016-12-06 DIAGNOSIS — E1140 Type 2 diabetes mellitus with diabetic neuropathy, unspecified: Secondary | ICD-10-CM | POA: Diagnosis not present

## 2016-12-06 LAB — BAYER DCA HB A1C WAIVED: HB A1C (BAYER DCA - WAIVED): 5.3 % (ref <7.0)

## 2016-12-06 MED ORDER — LOVASTATIN 40 MG PO TABS: 40.0000 mg | ORAL_TABLET | Freq: Every day | ORAL | 4 refills | Status: DC

## 2016-12-06 MED ORDER — METFORMIN HCL ER (MOD) 500 MG PO TB24: 500.0000 mg | ORAL_TABLET | Freq: Every day | ORAL | 4 refills | Status: DC

## 2016-12-06 MED ORDER — GLIPIZIDE 5 MG PO TABS: 5.0000 mg | ORAL_TABLET | Freq: Every day | ORAL | 4 refills | Status: DC

## 2016-12-06 MED ORDER — LOSARTAN POTASSIUM-HCTZ 100-25 MG PO TABS: 1.0000 | ORAL_TABLET | Freq: Every day | ORAL | 4 refills | Status: DC

## 2016-12-06 MED ORDER — CARVEDILOL 25 MG PO TABS: 25.0000 mg | ORAL_TABLET | Freq: Two times a day (BID) | ORAL | 4 refills | Status: DC

## 2016-12-06 MED ORDER — AMLODIPINE BESYLATE 10 MG PO TABS: 10.0000 mg | ORAL_TABLET | Freq: Every day | ORAL | 4 refills | Status: DC

## 2016-12-06 NOTE — Progress Notes (Signed)
BP 131/84   Pulse 75   Ht 6\' 1"  (1.854 m)   Wt 248 lb (112.5 kg)   SpO2 96%   BMI 32.72 kg/m    Subjective:    Patient ID: Henry Moore, male    DOB: [birth year], 56 y.o.   MRN: [id number]  HPI: Henry Moore is a 56 y.o. male  Diabetes hypertension check. Diabetes patient with working on weight loss has all in all been doing well but over the last month has woken up 3 times with blood sugars around 60 feeling hot and sweaty and shaky. Has cut back on metformin taking 500 mg 1D morning and no other dosing. Has cut back on glipizide 5 mg twice a day and reduced it to 5 mg just in the morning. Blood pressure no changes blood pressures been doing well. Taking cholesterol medicine again without problems.   Relevant past medical, surgical, family and social history reviewed and updated as indicated. Interim medical history since our last visit reviewed. Allergies and medications reviewed and updated.  Review of Systems  Constitutional: Negative.   Respiratory: Negative.   Cardiovascular: Negative.     Per HPI unless specifically indicated above     Objective:    BP 131/84   Pulse 75   Ht 6\' 1"  (1.854 m)   Wt 248 lb (112.5 kg)   SpO2 96%   BMI 32.72 kg/m   Wt Readings from Last 3 Encounters:  12/06/16 248 lb (112.5 kg)  03/15/16 246 lb (111.6 kg)  08/04/15 243 lb (110.2 kg)    Physical Exam  Constitutional: He is oriented to person, place, and time. He appears well-developed and well-nourished.  HENT:  Head: Normocephalic and atraumatic.  Eyes: Conjunctivae and EOM are normal.  Neck: Normal range of motion.  Cardiovascular: Normal rate, regular rhythm and normal heart sounds.   Pulmonary/Chest: Effort normal and breath sounds normal.  Musculoskeletal: Normal range of motion.  Neurological: He is alert and oriented to person, place, and time.  Skin: No erythema.  Psychiatric: He has a normal mood and affect. His behavior is normal. Judgment and thought  content normal.    Results for orders placed or performed in visit on 03/15/16  Bayer DCA Hb A1c Waived  Result Value Ref Range   Bayer DCA Hb A1c Waived 6.5 <0.1 %  Basic metabolic panel  Result Value Ref Range   Glucose 160 (H) 65 - 99 mg/dL   BUN 19 6 - 24 mg/dL   Creatinine, Ser 1.07 0.76 - 1.27 mg/dL   GFR calc non Af Amer 79 >59 mL/min/1.73   GFR calc Af Amer 92 >59 mL/min/1.73   BUN/Creatinine Ratio 18 9 - 20   Sodium 137 134 - 144 mmol/L   Potassium 4.3 3.5 - 5.2 mmol/L   Chloride 101 96 - 106 mmol/L   CO2 17 (L) 18 - 29 mmol/L   Calcium 9.4 8.7 - 10.2 mg/dL  Hemoglobin A1c  Result Value Ref Range   Hemoglobin A1C 6.5       Assessment & Plan:   Problem List Items Addressed This Visit      Cardiovascular and Mediastinum   Essential hypertension, benign - Primary    The current medical regimen is effective;  continue present plan and medications.       Relevant Medications   amLODipine (NORVASC) 10 MG tablet   carvedilol (COREG) 25 MG tablet   losartan-hydrochlorothiazide (HYZAAR) 100-25 MG tablet   lovastatin (MEVACOR) 40  MG tablet   Other Relevant Orders   Bayer DCA Hb A1c Waived   Basic metabolic panel     Endocrine   Type 2 diabetes, controlled, with neuropathy ([place])    Discussed diabetes with hyperglycemia Will cut back glipizide from 2 mg to 1 mg by breaking in half and consider stopping altogether continue with diet nutrition weight loss.      Relevant Medications   losartan-hydrochlorothiazide (HYZAAR) 100-25 MG tablet   lovastatin (MEVACOR) 40 MG tablet   metFORMIN (GLUMETZA) 500 MG (MOD) 24 hr tablet   glipiZIDE (GLUCOTROL) 5 MG tablet   Other Relevant Orders   Bayer DCA Hb A1c Waived   Basic metabolic panel     Other   S/P cervical spinal fusion    Ongoing back issues neck issues has degenerative disc of the cervical and lumbar spine hasn't had some evaluation yet may be interested in seeing another doctor.      Hyperlipemia   Relevant  Medications   amLODipine (NORVASC) 10 MG tablet   carvedilol (COREG) 25 MG tablet   losartan-hydrochlorothiazide (HYZAAR) 100-25 MG tablet   lovastatin (MEVACOR) 40 MG tablet   Other Relevant Orders   Bayer DCA Hb A1c Waived   Basic metabolic panel       Follow up plan: Return in about 3 months (around 03/08/2017) for Hemoglobin A1c, Physical Exam.

## 2016-12-06 NOTE — Assessment & Plan Note (Signed)
Ongoing back issues neck issues has degenerative disc of the cervical and lumbar spine hasn't had some evaluation yet may be interested in seeing another doctor.

## 2016-12-06 NOTE — Assessment & Plan Note (Signed)
Discussed diabetes with hyperglycemia Will cut back glipizide from 2 mg to 1 mg by breaking in half and consider stopping altogether continue with diet nutrition weight loss.

## 2016-12-06 NOTE — Assessment & Plan Note (Signed)
The current medical regimen is effective;  continue present plan and medications.  

## 2016-12-07 ENCOUNTER — Encounter: Payer: Self-pay | Admitting: Family Medicine

## 2016-12-07 LAB — BASIC METABOLIC PANEL
BUN/Creatinine Ratio: 16 (ref 9–20)
BUN: 18 mg/dL (ref 6–24)
CO2: 22 mmol/L (ref 18–29)
Calcium: 8.9 mg/dL (ref 8.7–10.2)
Chloride: 99 mmol/L (ref 96–106)
Creatinine, Ser: 1.16 mg/dL (ref 0.76–1.27)
GFR calc Af Amer: 83 mL/min/{1.73_m2} (ref >59)
GFR calc non Af Amer: 71 mL/min/{1.73_m2} (ref >59)
Glucose: 114 mg/dL — ABNORMAL HIGH (ref 65–99)
Potassium: 4.1 mmol/L (ref 3.5–5.2)
Sodium: 135 mmol/L (ref 134–144)

## 2016-12-13 ENCOUNTER — Other Ambulatory Visit: Payer: Self-pay | Admitting: Family Medicine

## 2017-03-12 ENCOUNTER — Encounter: Payer: Self-pay | Admitting: Family Medicine

## 2017-03-12 ENCOUNTER — Ambulatory Visit (INDEPENDENT_AMBULATORY_CARE_PROVIDER_SITE_OTHER): Payer: BC Managed Care – PPO | Admitting: Family Medicine

## 2017-03-12 VITALS — BP 132/70 | HR 74 | Ht 72.0 in | Wt 248.9 lb

## 2017-03-12 DIAGNOSIS — E785 Hyperlipidemia, unspecified: Secondary | ICD-10-CM

## 2017-03-12 DIAGNOSIS — Z1329 Encounter for screening for other suspected endocrine disorder: Secondary | ICD-10-CM

## 2017-03-12 DIAGNOSIS — Z1159 Encounter for screening for other viral diseases: Secondary | ICD-10-CM | POA: Diagnosis not present

## 2017-03-12 DIAGNOSIS — Z125 Encounter for screening for malignant neoplasm of prostate: Secondary | ICD-10-CM

## 2017-03-12 DIAGNOSIS — Z0000 Encounter for general adult medical examination without abnormal findings: Secondary | ICD-10-CM

## 2017-03-12 DIAGNOSIS — M5136 Other intervertebral disc degeneration, lumbar region: Secondary | ICD-10-CM

## 2017-03-12 DIAGNOSIS — Z114 Encounter for screening for human immunodeficiency virus [HIV]: Secondary | ICD-10-CM

## 2017-03-12 DIAGNOSIS — E1140 Type 2 diabetes mellitus with diabetic neuropathy, unspecified: Secondary | ICD-10-CM

## 2017-03-12 DIAGNOSIS — I10 Essential (primary) hypertension: Secondary | ICD-10-CM

## 2017-03-12 DIAGNOSIS — Z981 Arthrodesis status: Secondary | ICD-10-CM | POA: Diagnosis not present

## 2017-03-12 MED ORDER — CARVEDILOL 25 MG PO TABS: 25.0000 mg | ORAL_TABLET | Freq: Two times a day (BID) | ORAL | 12 refills | Status: DC

## 2017-03-12 MED ORDER — LOSARTAN POTASSIUM-HCTZ 100-25 MG PO TABS: 1.0000 | ORAL_TABLET | Freq: Every day | ORAL | 12 refills | Status: DC

## 2017-03-12 MED ORDER — GLIPIZIDE 5 MG PO TABS: 5.0000 mg | ORAL_TABLET | Freq: Every day | ORAL | 12 refills | Status: DC

## 2017-03-12 MED ORDER — METFORMIN HCL ER (MOD) 500 MG PO TB24: 500.0000 mg | ORAL_TABLET | Freq: Every day | ORAL | 12 refills | Status: DC

## 2017-03-12 MED ORDER — AMLODIPINE BESYLATE 10 MG PO TABS: 10.0000 mg | ORAL_TABLET | Freq: Every day | ORAL | 12 refills | Status: DC

## 2017-03-12 MED ORDER — LOVASTATIN 40 MG PO TABS: 40.0000 mg | ORAL_TABLET | Freq: Every day | ORAL | 12 refills | Status: DC

## 2017-03-12 NOTE — Progress Notes (Signed)
BP 132/70 (BP Location: Left Arm)   Pulse 74   Ht 6' (1.829 m)   Wt 248 lb 14.4 oz (112.9 kg)   SpO2 98%   BMI 33.76 kg/m    Subjective:    Patient ID: Henry Moore, male    DOB: Jan 31, 1961, 56 y.o.   MRN: [id number]  HPI: Henry Moore is a 56 y.o. male  Chief Complaint  Patient presents with  . Annual Exam  . Hypertension  Patient with complaints also of some nasal congestion especially on the left has had prior sinus surgeries and feels like his head and back to that same situation. Is interested in talking to ear nose and throat again about his nose. Diabetes doing well with no complaints No low blood sugar spells. Cholesterol does well with medications. Blood pressure does well with medications. No side effects and takes medicines faithfully. No bruit Relevant past medical, surgical, family and social history reviewed and updated as indicated. Interim medical history since our last visit reviewed. Allergies and medications reviewed and updated.  Review of Systems  Constitutional: Negative.   HENT: Negative.   Eyes: Negative.   Respiratory: Negative.   Cardiovascular: Negative.   Gastrointestinal: Negative.   Endocrine: Negative.   Genitourinary: Negative.   Musculoskeletal: Negative.   Skin: Negative.   Allergic/Immunologic: Negative.   Neurological: Negative.   Hematological: Negative.   Psychiatric/Behavioral: Negative.     Per HPI unless specifically indicated above     Objective:    BP 132/70 (BP Location: Left Arm)   Pulse 74   Ht 6' (1.829 m)   Wt 248 lb 14.4 oz (112.9 kg)   SpO2 98%   BMI 33.76 kg/m   Wt Readings from Last 3 Encounters:  03/12/17 248 lb 14.4 oz (112.9 kg)  12/06/16 248 lb (112.5 kg)  03/15/16 246 lb (111.6 kg)    Physical Exam  Constitutional: He is oriented to person, place, and time. He appears well-developed and well-nourished.  HENT:  Head: Normocephalic and atraumatic.  Right Ear: External ear normal.    Left Ear: External ear normal.  Eyes: Pupils are equal, round, and reactive to light. Conjunctivae and EOM are normal.  Neck: Normal range of motion. Neck supple.  Cardiovascular: Normal rate, regular rhythm, normal heart sounds and intact distal pulses.   Pulmonary/Chest: Effort normal and breath sounds normal.  Abdominal: Soft. Bowel sounds are normal. There is no splenomegaly or hepatomegaly.  Genitourinary: Rectum normal, prostate normal and penis normal.  Musculoskeletal: Normal range of motion.  Neurological: He is alert and oriented to person, place, and time. He has normal reflexes.  Skin: No rash noted. No erythema.  Psychiatric: He has a normal mood and affect. His behavior is normal. Judgment and thought content normal.    Results for orders placed or performed in visit on 12/06/16  Bayer DCA Hb A1c Waived  Result Value Ref Range   Bayer DCA Hb A1c Waived 5.3 <0.8 %  Basic metabolic panel  Result Value Ref Range   Glucose 114 (H) 65 - 99 mg/dL   BUN 18 6 - 24 mg/dL   Creatinine, Ser 1.16 0.76 - 1.27 mg/dL   GFR calc non Af Amer 71 >59 mL/min/1.73   GFR calc Af Amer 83 >59 mL/min/1.73   BUN/Creatinine Ratio 16 9 - 20   Sodium 135 134 - 144 mmol/L   Potassium 4.1 3.5 - 5.2 mmol/L   Chloride 99 96 - 106 mmol/L   CO2  22 18 - 29 mmol/L   Calcium 8.9 8.7 - 10.2 mg/dL      Assessment & Plan:   Problem List Items Addressed This Visit      Cardiovascular and Mediastinum   Essential hypertension, benign    The current medical regimen is effective;  continue present plan and medications.       Relevant Medications   amLODipine (NORVASC) 10 MG tablet   carvedilol (COREG) 25 MG tablet   losartan-hydrochlorothiazide (HYZAAR) 100-25 MG tablet   lovastatin (MEVACOR) 40 MG tablet   Other Relevant Orders   CBC with Differential/Platelet   Bayer DCA Hb A1c Waived     Endocrine   Type 2 diabetes, controlled, with neuropathy ([place])    The current medical regimen is  effective;  continue present plan and medications.       Relevant Medications   glipiZIDE (GLUCOTROL) 5 MG tablet   losartan-hydrochlorothiazide (HYZAAR) 100-25 MG tablet   lovastatin (MEVACOR) 40 MG tablet   metFORMIN (GLUMETZA) 500 MG (MOD) 24 hr tablet   Other Relevant Orders   Comprehensive metabolic panel   Urinalysis, Routine w reflex microscopic   Bayer DCA Hb A1c Waived     Musculoskeletal and Integument   DDD (degenerative disc disease), lumbar    Patient with 3 generating disks and chronic pain        Other   S/P cervical spinal fusion    The current medical regimen is effective;  continue present plan and medications.       Hyperlipemia    The current medical regimen is effective;  continue present plan and medications.       Relevant Medications   amLODipine (NORVASC) 10 MG tablet   carvedilol (COREG) 25 MG tablet   losartan-hydrochlorothiazide (HYZAAR) 100-25 MG tablet   lovastatin (MEVACOR) 40 MG tablet   Other Relevant Orders   Lipid panel   Bayer DCA Hb A1c Waived    Other Visit Diagnoses    Annual physical exam    -  Primary   Encounter for screening for HIV       Relevant Orders   HIV antibody   Need for hepatitis C screening test       Relevant Orders   Hepatitis C antibody   Thyroid disorder screen       Relevant Orders   TSH   Prostate cancer screening       Relevant Orders   PSA       Follow up plan: Return in about 6 months (around 09/12/2017) for Hemoglobin A1c, BMP,  Lipids, ALT, AST.

## 2017-03-12 NOTE — Assessment & Plan Note (Signed)
The current medical regimen is effective;  continue present plan and medications.  

## 2017-03-12 NOTE — Assessment & Plan Note (Signed)
Patient with 3 generating disks and chronic pain

## 2017-03-13 ENCOUNTER — Telehealth: Payer: Self-pay | Admitting: Family Medicine

## 2017-03-13 DIAGNOSIS — D649 Anemia, unspecified: Secondary | ICD-10-CM

## 2017-03-13 DIAGNOSIS — E871 Hypo-osmolality and hyponatremia: Secondary | ICD-10-CM

## 2017-03-13 LAB — COMPREHENSIVE METABOLIC PANEL
ALT: 34 IU/L (ref 0–44)
AST: 38 IU/L (ref 0–40)
Albumin/Globulin Ratio: 1.5 (ref 1.2–2.2)
Albumin: 4.7 g/dL (ref 3.5–5.5)
Alkaline Phosphatase: 125 IU/L — ABNORMAL HIGH (ref 39–117)
BUN/Creatinine Ratio: 11 (ref 9–20)
BUN: 12 mg/dL (ref 6–24)
Bilirubin Total: 0.9 mg/dL (ref 0.0–1.2)
CO2: 19 mmol/L — ABNORMAL LOW (ref 20–29)
Calcium: 9.2 mg/dL (ref 8.7–10.2)
Chloride: 97 mmol/L (ref 96–106)
Creatinine, Ser: 1.13 mg/dL (ref 0.76–1.27)
GFR calc Af Amer: 85 mL/min/{1.73_m2} (ref >59)
GFR calc non Af Amer: 74 mL/min/{1.73_m2} (ref >59)
Globulin, Total: 3.1 g/dL (ref 1.5–4.5)
Glucose: 124 mg/dL — ABNORMAL HIGH (ref 65–99)
Potassium: 4.3 mmol/L (ref 3.5–5.2)
Sodium: 130 mmol/L — ABNORMAL LOW (ref 134–144)
Total Protein: 7.8 g/dL (ref 6.0–8.5)

## 2017-03-13 LAB — LIPID PANEL
Chol/HDL Ratio: 2.7 ratio (ref 0.0–5.0)
Cholesterol, Total: 109 mg/dL (ref 100–199)
HDL: 41 mg/dL (ref >39)
LDL Calculated: 48 mg/dL (ref 0–99)
Triglycerides: 98 mg/dL (ref 0–149)
VLDL Cholesterol Cal: 20 mg/dL (ref 5–40)

## 2017-03-13 LAB — TSH: TSH: 4.2 u[IU]/mL (ref 0.450–4.500)

## 2017-03-13 LAB — PSA: Prostate Specific Ag, Serum: 0.2 ng/mL (ref 0.0–4.0)

## 2017-03-13 LAB — BAYER DCA HB A1C WAIVED: HB A1C (BAYER DCA - WAIVED): 5.5 % (ref <7.0)

## 2017-03-13 LAB — CBC WITH DIFFERENTIAL/PLATELET
Basophils Absolute: 0 10*3/uL (ref 0.0–0.2)
Basos: 1 %
EOS (ABSOLUTE): 0.3 10*3/uL (ref 0.0–0.4)
Eos: 8 %
Hematocrit: 36.5 % — ABNORMAL LOW (ref 37.5–51.0)
Hemoglobin: 12.6 g/dL — ABNORMAL LOW (ref 13.0–17.7)
Immature Grans (Abs): 0 10*3/uL (ref 0.0–0.1)
Immature Granulocytes: 0 %
Lymphocytes Absolute: 0.7 10*3/uL (ref 0.7–3.1)
Lymphs: 19 %
MCH: 33.5 pg — ABNORMAL HIGH (ref 26.6–33.0)
MCHC: 34.5 g/dL (ref 31.5–35.7)
MCV: 97 fL (ref 79–97)
Monocytes Absolute: 0.4 10*3/uL (ref 0.1–0.9)
Monocytes: 10 %
Neutrophils Absolute: 2.3 10*3/uL (ref 1.4–7.0)
Neutrophils: 62 %
Platelets: 80 10*3/uL — CL (ref 150–379)
RBC: 3.76 x10E6/uL — ABNORMAL LOW (ref 4.14–5.80)
RDW: 14.2 % (ref 12.3–15.4)
WBC: 3.7 10*3/uL (ref 3.4–10.8)

## 2017-03-13 LAB — HEPATITIS C ANTIBODY: Hep C Virus Ab: 0.1 s/co ratio (ref 0.0–0.9)

## 2017-03-13 NOTE — Telephone Encounter (Signed)
Phone call Discussed with patient slight decline in blood count will recheck CBC 6 months. Patient also drinking a lot of water will cut back on water also check BMP at next office visit.

## 2017-05-21 ENCOUNTER — Ambulatory Visit (INDEPENDENT_AMBULATORY_CARE_PROVIDER_SITE_OTHER): Payer: BC Managed Care – PPO | Admitting: Family Medicine

## 2017-05-21 ENCOUNTER — Encounter: Payer: Self-pay | Admitting: Family Medicine

## 2017-05-21 VITALS — BP 105/69 | HR 68 | Temp 98.6°F | Wt 247.0 lb

## 2017-05-21 DIAGNOSIS — J0191 Acute recurrent sinusitis, unspecified: Secondary | ICD-10-CM

## 2017-05-21 MED ORDER — ALPRAZOLAM 0.25 MG PO TABS: 0.2500 mg | ORAL_TABLET | Freq: Every evening | ORAL | 0 refills | Status: DC | PRN

## 2017-05-21 MED ORDER — AMOXICILLIN-POT CLAVULANATE 875-125 MG PO TABS: 1.0000 | ORAL_TABLET | Freq: Two times a day (BID) | ORAL | 0 refills | Status: DC

## 2017-05-21 NOTE — Progress Notes (Signed)
BP 105/69   Pulse 68   Temp 98.6 F (37 C)   Wt 247 lb (112 kg)   SpO2 99%   BMI 33.50 kg/m    Subjective:    Patient ID: Henry Moore, male    DOB: [birth year], 56 y.o.   MRN: [id number]  HPI: Decorey Wahlert [place] is a 57 y.o. male  Chief Complaint  Patient presents with  . URI    x 10 days, head/chest/nasal congestion, dry cough, sinus drainage, sore throat, runny nose at times, fever last week. No N/V/D.   Fevers, chills, facial pain/pressure, ear pain, dry cough, congestion, SOB x 10 days. Denies CP, N/V/D. Has not tried anything OTC, very nervous to try these types of medications as a rule. Trying saline rinses 4x daily. Has had several sick contacts lately. Is s/p two sinus surgeries in the past, most recent one 15 years ago. Long hx of sinus issues.   Relevant past medical, surgical, family and social history reviewed and updated as indicated. Interim medical history since our last visit reviewed. Allergies and medications reviewed and updated.  Review of Systems  Constitutional: Positive for chills and fever.  HENT: Positive for congestion, ear pain, sinus pain, sinus pressure and sneezing.   Eyes: Negative.   Respiratory: Positive for cough and shortness of breath.   Cardiovascular: Negative.   Gastrointestinal: Negative.   Musculoskeletal: Negative.   Neurological: Negative.   Psychiatric/Behavioral: Negative.    Per HPI unless specifically indicated above     Objective:    BP 105/69   Pulse 68   Temp 98.6 F (37 C)   Wt 247 lb (112 kg)   SpO2 99%   BMI 33.50 kg/m   Wt Readings from Last 3 Encounters:  05/21/17 247 lb (112 kg)  03/12/17 248 lb 14.4 oz (112.9 kg)  12/06/16 248 lb (112.5 kg)    Physical Exam  Constitutional: He is oriented to person, place, and time. He appears well-developed and well-nourished. No distress.  HENT:  Head: Atraumatic.  Right Ear: External ear normal.  Left Ear: External ear normal.  B/l sinuses ttp Oropharynx  and nasal mucosa erythematous B/l mild middle ear effusion with injected TMs  Eyes: Pupils are equal, round, and reactive to light. Conjunctivae are normal.  Neck: Normal range of motion. Neck supple.  Cardiovascular: Normal rate and normal heart sounds.   Pulmonary/Chest: Effort normal and breath sounds normal. No respiratory distress.  Musculoskeletal: Normal range of motion.  Lymphadenopathy:    He has no cervical adenopathy.  Neurological: He is alert and oriented to person, place, and time.  Skin: Skin is warm and dry. No rash noted.  Psychiatric: He has a normal mood and affect. His behavior is normal.  Nursing note and vitals reviewed.   Results for orders placed or performed in visit on 03/12/17  Hepatitis C antibody  Result Value Ref Range   Hep C Virus Ab 0.1 0.0 - 0.9 s/co ratio  CBC with Differential/Platelet  Result Value Ref Range   WBC 3.7 3.4 - 10.8 x10E3/uL   RBC 3.76 (L) 4.14 - 5.80 x10E6/uL   Hemoglobin 12.6 (L) 13.0 - 17.7 g/dL   Hematocrit 36.5 (L) 37.5 - 51.0 %   MCV 97 79 - 97 fL   MCH 33.5 (H) 26.6 - 33.0 pg   MCHC 34.5 31.5 - 35.7 g/dL   RDW 14.2 12.3 - 15.4 %   Platelets 80 (LL) 150 - 379 x10E3/uL   Neutrophils 62  Not Estab. %   Lymphs 19 Not Estab. %   Monocytes 10 Not Estab. %   Eos 8 Not Estab. %   Basos 1 Not Estab. %   Neutrophils Absolute 2.3 1.4 - 7.0 x10E3/uL   Lymphocytes Absolute 0.7 0.7 - 3.1 x10E3/uL   Monocytes Absolute 0.4 0.1 - 0.9 x10E3/uL   EOS (ABSOLUTE) 0.3 0.0 - 0.4 x10E3/uL   Basophils Absolute 0.0 0.0 - 0.2 x10E3/uL   Immature Granulocytes 0 Not Estab. %   Immature Grans (Abs) 0.0 0.0 - 0.1 x10E3/uL   Hematology Comments: Note:   Comprehensive metabolic panel  Result Value Ref Range   Glucose 124 (H) 65 - 99 mg/dL   BUN 12 6 - 24 mg/dL   Creatinine, Ser 1.13 0.76 - 1.27 mg/dL   GFR calc non Af Amer 74 >59 mL/min/1.73   GFR calc Af Amer 85 >59 mL/min/1.73   BUN/Creatinine Ratio 11 9 - 20   Sodium 130 (L) 134 - 144  mmol/L   Potassium 4.3 3.5 - 5.2 mmol/L   Chloride 97 96 - 106 mmol/L   CO2 19 (L) 20 - 29 mmol/L   Calcium 9.2 8.7 - 10.2 mg/dL   Total Protein 7.8 6.0 - 8.5 g/dL   Albumin 4.7 3.5 - 5.5 g/dL   Globulin, Total 3.1 1.5 - 4.5 g/dL   Albumin/Globulin Ratio 1.5 1.2 - 2.2   Bilirubin Total 0.9 0.0 - 1.2 mg/dL   Alkaline Phosphatase 125 (H) 39 - 117 IU/L   AST 38 0 - 40 IU/L   ALT 34 0 - 44 IU/L  Lipid panel  Result Value Ref Range   Cholesterol, Total 109 100 - 199 mg/dL   Triglycerides 98 0 - 149 mg/dL   HDL 41 >39 mg/dL   VLDL Cholesterol Cal 20 5 - 40 mg/dL   LDL Calculated 48 0 - 99 mg/dL   Chol/HDL Ratio 2.7 0.0 - 5.0 ratio  PSA  Result Value Ref Range   Prostate Specific Ag, Serum 0.2 0.0 - 4.0 ng/mL  TSH  Result Value Ref Range   TSH 4.200 0.450 - 4.500 uIU/mL  Bayer DCA Hb A1c Waived  Result Value Ref Range   Bayer DCA Hb A1c Waived 5.5 <7.0 %      Assessment & Plan:   Problem List Items Addressed This Visit    None    Visit Diagnoses    Acute recurrent sinusitis, unspecified location    -  Primary   Augmentin sent, continue sinus rinses, humidifier, rest, good hydration. F/u if worsening or no improvement   Relevant Medications   amoxicillin-clavulanate (AUGMENTIN) 875-125 MG tablet       Follow up plan: Return for as scheduled.

## 2017-05-23 NOTE — Patient Instructions (Signed)
Follow up as scheduled.  

## 2017-06-07 ENCOUNTER — Ambulatory Visit (INDEPENDENT_AMBULATORY_CARE_PROVIDER_SITE_OTHER): Payer: BC Managed Care – PPO

## 2017-06-07 DIAGNOSIS — Z23 Encounter for immunization: Secondary | ICD-10-CM | POA: Diagnosis not present

## 2017-09-18 ENCOUNTER — Ambulatory Visit: Payer: BC Managed Care – PPO | Admitting: Family Medicine

## 2018-06-11 ENCOUNTER — Other Ambulatory Visit: Payer: Self-pay | Admitting: Family Medicine

## 2018-06-11 DIAGNOSIS — I10 Essential (primary) hypertension: Secondary | ICD-10-CM

## 2018-06-11 DIAGNOSIS — E1140 Type 2 diabetes mellitus with diabetic neuropathy, unspecified: Secondary | ICD-10-CM

## 2018-06-11 DIAGNOSIS — E785 Hyperlipidemia, unspecified: Secondary | ICD-10-CM

## 2018-06-11 MED ORDER — CARVEDILOL 25 MG PO TABS: 25.0000 mg | ORAL_TABLET | Freq: Two times a day (BID) | ORAL | 0 refills | Status: DC

## 2018-06-11 MED ORDER — GLIPIZIDE 5 MG PO TABS: 5.0000 mg | ORAL_TABLET | Freq: Every day | ORAL | 0 refills | Status: DC

## 2018-06-11 MED ORDER — LOVASTATIN 40 MG PO TABS: 40.0000 mg | ORAL_TABLET | Freq: Every day | ORAL | 0 refills | Status: DC

## 2018-06-11 MED ORDER — AMLODIPINE BESYLATE 10 MG PO TABS: 10.0000 mg | ORAL_TABLET | Freq: Every day | ORAL | 0 refills | Status: DC

## 2018-06-11 MED ORDER — METFORMIN HCL ER (MOD) 500 MG PO TB24: 500.0000 mg | ORAL_TABLET | Freq: Every day | ORAL | 0 refills | Status: DC

## 2018-06-11 NOTE — Telephone Encounter (Signed)
Requested Prescriptions  Pending Prescriptions Disp Refills  . amLODipine (NORVASC) 10 MG tablet 30 tablet 0    Sig: Take 1 tablet (10 mg total) by mouth daily.     Cardiovascular:  Calcium Channel Blockers Failed - 06/11/2018 10:56 AM      Failed - Valid encounter within last 6 months    Recent Outpatient Visits          1 year ago Acute recurrent sinusitis, unspecified location   [facility] Lilia Argue, [place]   1 year ago Annual physical exam   [facility] [place], Jeannette How, [place]   1 year ago Essential hypertension, benign   [facility] [place], Jeannette How, [place]   2 years ago Essential hypertension, benign   [facility] Cr[place], Jeannette How, [place]   2 years ago Diabetes mellitus without complication [facility])   [place], [place]      Future Appointments            In 1 week Crissman, Jeannette How, MD [place], [facility]           Passed - Last BP in normal range    BP Readings from Last 1 Encounters:  05/21/17 105/69       . carvedilol (COREG) 25 MG tablet 60 tablet 0    Sig: Take 1 tablet (25 mg total) by mouth 2 (two) times daily with a meal.     Cardiovascular:  Beta Blockers Failed - 06/11/2018 10:56 AM      Failed - Valid encounter within last 6 months    Recent Outpatient Visits          1 year ago Acute recurrent sinusitis, unspecified location   [facility], Lilia Arg[place], [place]   1 year ago Annual physical exam   [facility] Guadalupe Maple, [place]   1 year ago Essential hypertension, benign   [facility] Cr[place], Jeannette How, [place]   2 years ago Essential hypertension, benign   [place], Jeannette How, MD   2 years ago Diabetes mellitus without complication ([place])   [place], [place]      Future Appointments            In 1 week Crissman, Jeannette How, MD [place], [facility]            Passed - Last BP in normal range    BP Readings from Last 1 Encounters:  05/21/17 105/69         Passed - Last Heart Rate in normal range    Pulse Readings from Last 1 Encounters:  05/21/17 68       . glipiZIDE (GLUCOTROL) 5 MG tablet 30 tablet 0    Sig: Take 1 tablet (5 mg total) by mouth daily before breakfast.     Endocrinology:  Diabetes - Sulfonylureas Failed - 06/11/2018 10:56 AM      Failed - HBA1C is between 0 and 7.9 and within 180 days    Hemoglobin A1C  Date Value Ref Range Status  03/15/2016 6.5  Final         Failed - Valid encounter within last 6 months    Recent Outpatient Visits          1 year ago Acute recurrent sinusitis, unspecified location   [place], [place]   1 year ago Annual physical exam  [place], Jeannette How, MD   1 year ago Essential hypertension, benign   [facility] Cr[place], Jeannette How, MD   2 years ago Essential hypertension, benign   [facility] Cr[place], Jeannette How, MD   2 years ago Diabetes mellitus without complication [facility])   [place], [place]      Future Appointments            In 1 week Crissman, Jeannette How, MD [place], [facility]         . lovastatin (MEVACOR) 40 MG tablet 30 tablet 0    Sig: Take 1 tablet (40 mg total) by mouth at bedtime.     Cardiovascular:  Antilipid - Statins Failed - 06/11/2018 10:56 AM      Failed - Total Cholesterol in normal range and within 360 days    Cholesterol, Total  Date Value Ref Range Status  03/12/2017 109 100 - 199 mg/dL Final   Cholesterol Pic[place]o, Waived  Date Value Ref Range Status  05/13/2015 122 <200 mg/dL Final    Comment:                            Desirable                <200                         Borderline High      200- 239                         High                     >239          Failed - LDL in normal range and within 360 days    LDL Calculated   Date Value Ref Range Status  03/12/2017 48 0 - 99 mg/dL Final         Failed - HDL in normal range and within 360 days    HDL  Date Value Ref Range Status  03/12/2017 41 >39 mg/dL Final         Failed - Triglycerides in normal range and within 360 days    Triglycerides  Date Value Ref Range Status  03/12/2017 98 0 - 149 mg/dL Final   Triglycerides Piccolo,Waived  Date Value Ref Range Status  05/13/2015 94 <150 mg/dL Final    Comment:                            Normal                   <150                         Borderline High     150 - 199                         High                200 - 499                         Very High                >  499          Failed - Valid encounter within last 12 months    Recent Outpatient Visits          1 year ago Acute recurrent sinusitis, unspecified location   [facility], Lilia Argue, [place]   1 year ago Annual physical exam   [facility] Cr[place], Jeannette How, [place]   1 year ago Essential hypertension, benign   [facility] Cr[place], Jeannette How, [place]   2 years ago Essential hypertension, benign   [facility] Cr[place], Jeannette How, MD   2 years ago Diabetes mellitus without complication [facility])   [place], MD      Future Appointments            In 1 week Crissman, Jeannette How, MD [place], [place] - Patient is not pregnant    . metFORMIN (GLUMETZA) 500 MG (MOD) 24 hr tablet 30 tablet 0    Sig: Take 1 tablet (500 mg total) by mouth daily with breakfast.     Endocrinology:  Diabetes - Biguanides Failed - 06/11/2018 10:56 AM      Failed - Cr in normal range and within 360 days    Creatinine, Ser  Date Value Ref Range Status  03/12/2017 1.13 0.76 - 1.27 mg/dL Final         Failed - HBA1C is between 0 and 7.9 and within 180 days    Hemoglobin A1C  Date Value Ref Range Status  03/15/2016 6.5  Final         Failed - eGFR  in normal range and within 360 days    GFR calc Af Amer  Date Value Ref Range Status  03/12/2017 85 >59 mL/min/1.73 Final   GFR calc non Af Amer  Date Value Ref Range Status  03/12/2017 74 >59 mL/min/1.73 Final         Failed - Valid encounter within last 6 months    Recent Outpatient Visits          1 year ago Acute recurrent sinusitis, unspecified location   [facility], Lilia Argue, [place]   1 year ago Annual physical exam   [facility] Guadalupe Maple, [place]   1 year ago Essential hypertension, benign   [facility] [place], Jeannette How, [place]   2 years ago Essential hypertension, benign   [place] Cr[place], Jeannette How, MD   2 years ago Diabetes mellitus without complication [facility])   [place], MD      Future Appointments            In 1 week Crissman, Jeannette How, MD [facility], [facility]

## 2018-06-11 NOTE — Telephone Encounter (Signed)
Copied from [place] [id number]. Topic: General - Other >> Jun 11, 2018 10:33 AM Janace Aris A wrote: Medication: metFORMIN (GLUMETZA) 500 MG (MOD) 24 hr tablet, lovastatin (MEVACOR) 40 MG tablet, carvedilol (COREG) 25 MG tablet, amLODipine (NORVASC) 10 MG tablet , glipiZIDE (GLUCOTROL) 5 MG tablet   Has the patient contacted their pharmacy?Yes  Preferred Pharmacy (with phone number or street name): [place], [place]  [phone] (Phone) [phone] (Fax)    Agent: Please be advised that RX refills may take up to 3 business days. We ask that you follow-up with your pharmacy.

## 2018-06-16 ENCOUNTER — Other Ambulatory Visit: Payer: Self-pay | Admitting: Family Medicine

## 2018-06-16 DIAGNOSIS — I10 Essential (primary) hypertension: Secondary | ICD-10-CM

## 2018-06-16 DIAGNOSIS — E785 Hyperlipidemia, unspecified: Secondary | ICD-10-CM

## 2018-06-18 ENCOUNTER — Encounter: Payer: Self-pay | Admitting: Family Medicine

## 2018-06-18 ENCOUNTER — Ambulatory Visit: Payer: BC Managed Care – PPO | Admitting: Family Medicine

## 2018-06-18 VITALS — BP 132/70 | HR 75 | Temp 98.9°F | Ht 71.4 in | Wt 253.4 lb

## 2018-06-18 DIAGNOSIS — Z23 Encounter for immunization: Secondary | ICD-10-CM

## 2018-06-18 DIAGNOSIS — I10 Essential (primary) hypertension: Secondary | ICD-10-CM | POA: Diagnosis not present

## 2018-06-18 DIAGNOSIS — E785 Hyperlipidemia, unspecified: Secondary | ICD-10-CM

## 2018-06-18 DIAGNOSIS — E1140 Type 2 diabetes mellitus with diabetic neuropathy, unspecified: Secondary | ICD-10-CM

## 2018-06-18 DIAGNOSIS — M5136 Other intervertebral disc degeneration, lumbar region: Secondary | ICD-10-CM

## 2018-06-18 LAB — LP+ALT+AST PICCOLO, WAIVED
ALT (SGPT) Piccolo, Waived: 33 U/L (ref 10–47)
AST (SGOT) Piccolo, Waived: 41 U/L — ABNORMAL HIGH (ref 11–38)
Chol/HDL Ratio Piccolo,Waive: 2.7 mg/dL
Cholesterol Piccolo, Waived: 116 mg/dL (ref <200)
HDL Chol Piccolo, Waived: 42 mg/dL — ABNORMAL LOW (ref >59)
LDL Chol Calc Piccolo Waived: 49 mg/dL (ref <100)
Triglycerides Piccolo,Waived: 123 mg/dL (ref <150)
VLDL Chol Calc Piccolo,Waive: 25 mg/dL (ref <30)

## 2018-06-18 LAB — BAYER DCA HB A1C WAIVED: HB A1C (BAYER DCA - WAIVED): 5.5 % (ref <7.0)

## 2018-06-18 MED ORDER — LOVASTATIN 40 MG PO TABS: 40.0000 mg | ORAL_TABLET | Freq: Every day | ORAL | 3 refills | Status: DC

## 2018-06-18 MED ORDER — METFORMIN HCL ER (MOD) 500 MG PO TB24: 500.0000 mg | ORAL_TABLET | Freq: Every day | ORAL | 3 refills | Status: DC

## 2018-06-18 MED ORDER — LOSARTAN POTASSIUM-HCTZ 100-25 MG PO TABS: 1.0000 | ORAL_TABLET | Freq: Every day | ORAL | 3 refills | Status: DC

## 2018-06-18 MED ORDER — CARVEDILOL 25 MG PO TABS: 25.0000 mg | ORAL_TABLET | Freq: Two times a day (BID) | ORAL | 3 refills | Status: AC

## 2018-06-18 MED ORDER — GLIPIZIDE 5 MG PO TABS: 5.0000 mg | ORAL_TABLET | Freq: Every day | ORAL | 3 refills | Status: DC

## 2018-06-18 MED ORDER — AMLODIPINE BESYLATE 10 MG PO TABS: 10.0000 mg | ORAL_TABLET | Freq: Every day | ORAL | 3 refills | Status: DC

## 2018-06-18 NOTE — Assessment & Plan Note (Signed)
Getting worse has apt neurosurgery

## 2018-06-18 NOTE — Patient Instructions (Signed)
Pneumococcal Polysaccharide Vaccine: What You Need to Know 1. Why get vaccinated? Vaccination can protect older adults (and some children and younger adults) from pneumococcal disease. Pneumococcal disease is caused by bacteria that can spread from person to person through close contact. It can cause ear infections, and it can also lead to more serious infections of the:  Lungs (pneumonia),  Blood (bacteremia), and  Covering of the brain and spinal cord (meningitis). Meningitis can cause deafness and brain damage, and it can be fatal.  Anyone can get pneumococcal disease, but children under 52 years of age, people with certain medical conditions, adults over 55 years of age, and cigarette smokers are at the highest risk. About 18,000 older adults die each year from pneumococcal disease in the [place]. Treatment of pneumococcal infections with penicillin and other drugs used to be more effective. But some strains of the disease have become resistant to these drugs. This makes prevention of the disease, through vaccination, even more important. 2. Pneumococcal polysaccharide vaccine (PPSV23) Pneumococcal polysaccharide vaccine (PPSV23) protects against 23 types of pneumococcal bacteria. It will not prevent all pneumococcal disease. PPSV23 is recommended for:  All adults 55 years of age and older,  Anyone 2 through 55 years of age with certain long-term health problems,  Anyone 2 through 55 years of age with a weakened immune system,  Adults 55 through 55 years of age who smoke cigarettes or have asthma.  Most people need only one dose of PPSV. A second dose is recommended for certain high-risk groups. People 55 and older should get a dose even if they have gotten one or more doses of the vaccine before they turned 65. Your healthcare provider can give you more information about these recommendations. Most healthy adults develop protection within 2 to 3 weeks of getting the shot. 3.  Some people should not get this vaccine  Anyone who has had a life-threatening allergic reaction to PPSV should not get another dose.  Anyone who has a severe allergy to any component of PPSV should not receive it. Tell your provider if you have any severe allergies.  Anyone who is moderately or severely ill when the shot is scheduled may be asked to wait until they recover before getting the vaccine. Someone with a mild illness can usually be vaccinated.  Children less than 55 years of age should not receive this vaccine.  There is no evidence that PPSV is harmful to either a pregnant woman or to her fetus. However, as a precaution, women who need the vaccine should be vaccinated before becoming pregnant, if possible. 4. Risks of a vaccine reaction With any medicine, including vaccines, there is a chance of side effects. These are usually mild and go away on their own, but serious reactions are also possible. About half of people who get PPSV have mild side effects, such as redness or pain where the shot is given, which go away within about two days. Less than 1 out of 100 people develop a fever, muscle aches, or more severe local reactions. Problems that could happen after any vaccine:  People sometimes faint after a medical procedure, including vaccination. Sitting or lying down for about 15 minutes can help prevent fainting, and injuries caused by a fall. Tell your doctor if you feel dizzy, or have vision changes or ringing in the ears.  Some people get severe pain in the shoulder and have difficulty moving the arm where a shot was given. This happens very rarely.  Any  medication can cause a severe allergic reaction. Such reactions from a vaccine are very rare, estimated at about 1 in a million doses, and would happen within a few minutes to a few hours after the vaccination. As with any medicine, there is a very remote chance of a vaccine causing a serious injury or death. The safety of  vaccines is always being monitored. For more information, visit: http://www.aguilar.org/ 5. What if there is a serious reaction? What should I look for? Look for anything that concerns you, such as signs of a severe allergic reaction, very high fever, or unusual behavior. Signs of a severe allergic reaction can include hives, swelling of the face and throat, difficulty breathing, a fast heartbeat, dizziness, and weakness. These would usually start a few minutes to a few hours after the vaccination. What should I do? If you think it is a severe allergic reaction or other emergency that can't wait, call 9-1-1 or get to the nearest hospital. Otherwise, call your doctor. Afterward, the reaction should be reported to the Vaccine Adverse Event Reporting System (VAERS). Your doctor might file this report, or you can do it yourself through the VAERS web site at www.vaers.SamedayNews.es, or by calling 873-168-4603. VAERS does not give medical advice. 6. How can I learn more?  Ask your doctor. He or she can give you the vaccine package insert or suggest other sources of information.  Call your local or state health department.  Contact the Centers for Disease Control and Prevention (CDC): ? Call (712)740-2354 (1-800-CDC-INFO) or ? Visit CDC's website at http://hunter.com/ CDC Pneumococcal Polysaccharide Vaccine VIS (11/21/13) This information is not intended to replace advice given to you by your health care provider. Make sure you discuss any questions you have with your health care provider. Document Released: 05/14/2006 Document Revised: 04/06/2016 Document Reviewed: 04/06/2016 Elsevier Interactive Patient Education  2017 [place]. Influenza (Flu) Vaccine (Inactivated or Recombinant): What You Need to Know 1. Why get vaccinated? Influenza ("flu") is a contagious disease that spreads around the [place] every year, usually between October and May. Flu is caused by influenza viruses, and is  spread mainly by coughing, sneezing, and close contact. Anyone can get flu. Flu strikes suddenly and can last several days. Symptoms vary by age, but can include:  fever/chills  sore throat  muscle aches  fatigue  cough  headache  runny or stuffy nose  Flu can also lead to pneumonia and blood infections, and cause diarrhea and seizures in children. If you have a medical condition, such as heart or lung disease, flu can make it worse. Flu is more dangerous for some people. Infants and young children, people 61 years of age and older, pregnant women, and people with certain health conditions or a weakened immune system are at greatest risk. Each year thousands of people in the [place] die from flu, and many more are hospitalized. Flu vaccine can:  keep you from getting flu,  make flu less severe if you do get it, and  keep you from spreading flu to your family and other people. 2. Inactivated and recombinant flu vaccines A dose of flu vaccine is recommended every flu season. Children 6 months through 75 years of age may need two doses during the same flu season. Everyone else needs only one dose each flu season. Some inactivated flu vaccines contain a very small amount of a mercury-based preservative called thimerosal. Studies have not shown thimerosal in vaccines to be harmful, but flu vaccines that do  not contain thimerosal are available. There is no live flu virus in flu shots. They cannot cause the flu. There are many flu viruses, and they are always changing. Each year a new flu vaccine is made to protect against three or four viruses that are likely to cause disease in the upcoming flu season. But even when the vaccine doesn't exactly match these viruses, it may still provide some protection. Flu vaccine cannot prevent:  flu that is caused by a virus not covered by the vaccine, or  illnesses that look like flu but are not.  It takes about 2 weeks for protection to  develop after vaccination, and protection lasts through the flu season. 3. Some people should not get this vaccine Tell the person who is giving you the vaccine:  If you have any severe, life-threatening allergies. If you ever had a life-threatening allergic reaction after a dose of flu vaccine, or have a severe allergy to any part of this vaccine, you may be advised not to get vaccinated. Most, but not all, types of flu vaccine contain a small amount of egg protein.  If you ever had Guillain-Barr Syndrome (also called GBS). Some people with a history of GBS should not get this vaccine. This should be discussed with your doctor.  If you are not feeling well. It is usually okay to get flu vaccine when you have a mild illness, but you might be asked to come back when you feel better.  4. Risks of a vaccine reaction With any medicine, including vaccines, there is a chance of reactions. These are usually mild and go away on their own, but serious reactions are also possible. Most people who get a flu shot do not have any problems with it. Minor problems following a flu shot include:  soreness, redness, or swelling where the shot was given  hoarseness  sore, red or itchy eyes  cough  fever  aches  headache  itching  fatigue  If these problems occur, they usually begin soon after the shot and last 1 or 2 days. More serious problems following a flu shot can include the following:  There may be a small increased risk of Guillain-Barre Syndrome (GBS) after inactivated flu vaccine. This risk has been estimated at 1 or 2 additional cases per million people vaccinated. This is much lower than the risk of severe complications from flu, which can be prevented by flu vaccine.  Young children who get the flu shot along with pneumococcal vaccine (PCV13) and/or DTaP vaccine at the same time might be slightly more likely to have a seizure caused by fever. Ask your doctor for more information.  Tell your doctor if a child who is getting flu vaccine has ever had a seizure.  Problems that could happen after any injected vaccine:  People sometimes faint after a medical procedure, including vaccination. Sitting or lying down for about 15 minutes can help prevent fainting, and injuries caused by a fall. Tell your doctor if you feel dizzy, or have vision changes or ringing in the ears.  Some people get severe pain in the shoulder and have difficulty moving the arm where a shot was given. This happens very rarely.  Any medication can cause a severe allergic reaction. Such reactions from a vaccine are very rare, estimated at about 1 in a million doses, and would happen within a few minutes to a few hours after the vaccination. As with any medicine, there is a very remote chance of a  vaccine causing a serious injury or death. The safety of vaccines is always being monitored. For more information, visit: http://www.aguilar.org/ 5. What if there is a serious reaction? What should I look for? Look for anything that concerns you, such as signs of a severe allergic reaction, very high fever, or unusual behavior. Signs of a severe allergic reaction can include hives, swelling of the face and throat, difficulty breathing, a fast heartbeat, dizziness, and weakness. These would start a few minutes to a few hours after the vaccination. What should I do?  If you think it is a severe allergic reaction or other emergency that can't wait, call 9-1-1 and get the person to the nearest hospital. Otherwise, call your doctor.  Reactions should be reported to the Vaccine Adverse Event Reporting System (VAERS). Your doctor should file this report, or you can do it yourself through the VAERS web site at www.vaers.SamedayNews.es, or by calling 845-339-7528. ? VAERS does not give medical advice. 6. The National Vaccine Injury Compensation Program The [employer] Vaccine Injury Compensation Program (VICP) is a federal  program that was created to compensate people who may have been injured by certain vaccines. Persons who believe they may have been injured by a vaccine can learn about the program and about filing a claim by calling 3160761205 or visiting the [employer] website at GoldCloset.com.ee. There is a time limit to file a claim for compensation. 7. How can I learn more?  Ask your healthcare provider. He or she can give you the vaccine package insert or suggest other sources of information.  Call your local or state health department.  Contact the Centers for Disease Control and Prevention (CDC): ? Call (714) 482-6722 (1-800-CDC-INFO) or ? Visit CDC's website at https://gibson.com/ Vaccine Information Statement, Inactivated Influenza Vaccine (03/06/2014) This information is not intended to replace advice given to you by your health care provider. Make sure you discuss any questions you have with your health care provider. Document Released: 05/11/2006 Document Revised: 04/06/2016 Document Reviewed: 04/06/2016 Elsevier Interactive Patient Education  2017 [employer].

## 2018-06-18 NOTE — Progress Notes (Signed)
BP 132/70 (BP Location: Left Arm)   Pulse 75   Temp 98.9 F (37.2 C) (Oral)   Ht 5' 11.4" (1.814 m)   Wt 253 lb 6.4 oz (114.9 kg)   SpO2 97%   BMI 34.95 kg/m    Subjective:    Patient ID: Henry Moore, male    DOB: 1961/05/23, 57 y.o.   MRN: [id number]  HPI: Henry Moore is a 57 y.o. male  Chief Complaint  Patient presents with  . Diabetes  . Hyperlipidemia  . Hypertension  Patient with a great deal going on.  His father just passed and caregiver with his brother and sister for his mom who is elderly and having great deal of problems. Patient's diabetes hypertension hypercholesterol has been doing stable but has been eating a lot of junk food at the hospital and concerned about how his diabetes is doing.  No low blood sugar spells. Patient's chronic degenerative disc disease of his lower back is gotten worse has a neurosurgery appointment coming up next month.  Patient's pain is gotten significantly worse along with the stress.   Relevant past medical, surgical, family and social history reviewed and updated as indicated. Interim medical history since our last visit reviewed. Allergies and medications reviewed and updated.  Review of Systems  Constitutional: Negative.   Respiratory: Negative.   Cardiovascular: Negative.     Per HPI unless specifically indicated above     Objective:    BP 132/70 (BP Location: Left Arm)   Pulse 75   Temp 98.9 F (37.2 C) (Oral)   Ht 5' 11.4" (1.814 m)   Wt 253 lb 6.4 oz (114.9 kg)   SpO2 97%   BMI 34.95 kg/m   Wt Readings from Last 3 Encounters:  06/18/18 253 lb 6.4 oz (114.9 kg)  05/21/17 247 lb (112 kg)  03/12/17 248 lb 14.4 oz (112.9 kg)    Physical Exam  Constitutional: He is oriented to person, place, and time. He appears well-developed and well-nourished.  HENT:  Head: Normocephalic and atraumatic.  Eyes: Conjunctivae and EOM are normal.  Neck: Normal range of motion.  Cardiovascular: Normal rate, regular  rhythm and normal heart sounds.  Pulmonary/Chest: Effort normal and breath sounds normal.  Musculoskeletal: Normal range of motion.  Neurological: He is alert and oriented to person, place, and time.  Skin: No erythema.  Psychiatric: He has a normal mood and affect. His behavior is normal. Judgment and thought content normal.    Results for orders placed or performed in visit on 03/12/17  Hepatitis C antibody  Result Value Ref Range   Hep C Virus Ab 0.1 0.0 - 0.9 s/co ratio  CBC with Differential/Platelet  Result Value Ref Range   WBC 3.7 3.4 - 10.8 x10E3/uL   RBC 3.76 (L) 4.14 - 5.80 x10E6/uL   Hemoglobin 12.6 (L) 13.0 - 17.7 g/dL   Hematocrit 36.5 (L) 37.5 - 51.0 %   MCV 97 79 - 97 fL   MCH 33.5 (H) 26.6 - 33.0 pg   MCHC 34.5 31.5 - 35.7 g/dL   RDW 14.2 12.3 - 15.4 %   Platelets 80 (LL) 150 - 379 x10E3/uL   Neutrophils 62 Not Estab. %   Lymphs 19 Not Estab. %   Monocytes 10 Not Estab. %   Eos 8 Not Estab. %   Basos 1 Not Estab. %   Neutrophils Absolute 2.3 1.4 - 7.0 x10E3/uL   Lymphocytes Absolute 0.7 0.7 - 3.1 x10E3/uL  Monocytes Absolute 0.4 0.1 - 0.9 x10E3/uL   EOS (ABSOLUTE) 0.3 0.0 - 0.4 x10E3/uL   Basophils Absolute 0.0 0.0 - 0.2 x10E3/uL   Immature Granulocytes 0 Not Estab. %   Immature Grans (Abs) 0.0 0.0 - 0.1 x10E3/uL   Hematology Comments: Note:   Comprehensive metabolic panel  Result Value Ref Range   Glucose 124 (H) 65 - 99 mg/dL   BUN 12 6 - 24 mg/dL   Creatinine, Ser 1.13 0.76 - 1.27 mg/dL   GFR calc non Af Amer 74 >59 mL/min/1.73   GFR calc Af Amer 85 >59 mL/min/1.73   BUN/Creatinine Ratio 11 9 - 20   Sodium 130 (L) 134 - 144 mmol/L   Potassium 4.3 3.5 - 5.2 mmol/L   Chloride 97 96 - 106 mmol/L   CO2 19 (L) 20 - 29 mmol/L   Calcium 9.2 8.7 - 10.2 mg/dL   Total Protein 7.8 6.0 - 8.5 g/dL   Albumin 4.7 3.5 - 5.5 g/dL   Globulin, Total 3.1 1.5 - 4.5 g/dL   Albumin/Globulin Ratio 1.5 1.2 - 2.2   Bilirubin Total 0.9 0.0 - 1.2 mg/dL   Alkaline  Phosphatase 125 (H) 39 - 117 IU/L   AST 38 0 - 40 IU/L   ALT 34 0 - 44 IU/L  Lipid panel  Result Value Ref Range   Cholesterol, Total 109 100 - 199 mg/dL   Triglycerides 98 0 - 149 mg/dL   HDL 41 >39 mg/dL   VLDL Cholesterol Cal 20 5 - 40 mg/dL   LDL Calculated 48 0 - 99 mg/dL   Chol/HDL Ratio 2.7 0.0 - 5.0 ratio  PSA  Result Value Ref Range   Prostate Specific Ag, Serum 0.2 0.0 - 4.0 ng/mL  TSH  Result Value Ref Range   TSH 4.200 0.450 - 4.500 uIU/mL  Bayer DCA Hb A1c Waived  Result Value Ref Range   HB A1C (BAYER DCA - WAIVED) 5.5 <7.0 %      Assessment & Plan:   Problem List Items Addressed This Visit      Cardiovascular and Mediastinum   Essential hypertension, benign    The current medical regimen is effective;  continue present plan and medications.       Relevant Medications   losartan-hydrochlorothiazide (HYZAAR) 100-25 MG tablet   amLODipine (NORVASC) 10 MG tablet   carvedilol (COREG) 25 MG tablet   lovastatin (MEVACOR) 40 MG tablet     Endocrine   Type 2 diabetes, controlled, with neuropathy ([place])    The current medical regimen is effective;  continue present plan and medications.       Relevant Medications   losartan-hydrochlorothiazide (HYZAAR) 100-25 MG tablet   lovastatin (MEVACOR) 40 MG tablet   glipiZIDE (GLUCOTROL) 5 MG tablet   metFORMIN (GLUMETZA) 500 MG (MOD) 24 hr tablet     Musculoskeletal and Integument   DDD (degenerative disc disease), lumbar    Getting worse has apt neurosurgery        Other   Hyperlipemia    The current medical regimen is effective;  continue present plan and medications.       Relevant Medications   losartan-hydrochlorothiazide (HYZAAR) 100-25 MG tablet   amLODipine (NORVASC) 10 MG tablet   carvedilol (COREG) 25 MG tablet   lovastatin (MEVACOR) 40 MG tablet   Other Relevant Orders   LP+ALT+AST [place], Waived    Other Visit Diagnoses    Type 2 diabetes mellitus with diabetic neuropathy, unspecified  whether long term  insulin use (HCC)    -  Primary   Relevant Medications   losartan-hydrochlorothiazide (HYZAAR) 100-25 MG tablet   lovastatin (MEVACOR) 40 MG tablet   glipiZIDE (GLUCOTROL) 5 MG tablet   metFORMIN (GLUMETZA) 500 MG (MOD) 24 hr tablet   Other Relevant Orders   Bayer DCA Hb A1c Waived   Essential hypertension       Relevant Medications   losartan-hydrochlorothiazide (HYZAAR) 100-25 MG tablet   amLODipine (NORVASC) 10 MG tablet   carvedilol (COREG) 25 MG tablet   lovastatin (MEVACOR) 40 MG tablet   Other Relevant Orders   Basic metabolic panel   Need for influenza vaccination       Relevant Orders   Flu Vaccine QUAD 36+ mos IM (Completed)   Need for pneumococcal vaccination       Relevant Orders   Pneumococcal polysaccharide vaccine 23-valent greater than or equal to 2yo subcutaneous/IM (Completed)       Follow up plan: Return for Physical Exam, Aug.

## 2018-06-18 NOTE — Assessment & Plan Note (Signed)
The current medical regimen is effective;  continue present plan and medications.  

## 2018-06-19 ENCOUNTER — Telehealth: Payer: Self-pay | Admitting: Family Medicine

## 2018-06-19 LAB — BASIC METABOLIC PANEL
BUN/Creatinine Ratio: 13 (ref 9–20)
BUN: 13 mg/dL (ref 6–24)
CO2: 19 mmol/L — ABNORMAL LOW (ref 20–29)
Calcium: 8.7 mg/dL (ref 8.7–10.2)
Chloride: 96 mmol/L (ref 96–106)
Creatinine, Ser: 1.03 mg/dL (ref 0.76–1.27)
GFR calc Af Amer: 94 mL/min/{1.73_m2} (ref >59)
GFR calc non Af Amer: 81 mL/min/{1.73_m2} (ref >59)
Glucose: 183 mg/dL — ABNORMAL HIGH (ref 65–99)
Potassium: 4.3 mmol/L (ref 3.5–5.2)
Sodium: 132 mmol/L — ABNORMAL LOW (ref 134–144)

## 2018-06-19 NOTE — Telephone Encounter (Signed)
Phone call Discussed with patient low sodium patient drinks a lot of water not alcohol and will cut back some water to help with his sodium levels.

## 2018-07-01 ENCOUNTER — Other Ambulatory Visit: Payer: Self-pay | Admitting: Student

## 2018-07-01 DIAGNOSIS — M5441 Lumbago with sciatica, right side: Principal | ICD-10-CM

## 2018-07-01 DIAGNOSIS — G8929 Other chronic pain: Secondary | ICD-10-CM

## 2018-07-01 DIAGNOSIS — M5442 Lumbago with sciatica, left side: Principal | ICD-10-CM

## 2018-07-01 DIAGNOSIS — M546 Pain in thoracic spine: Secondary | ICD-10-CM

## 2018-07-13 ENCOUNTER — Ambulatory Visit
Admission: RE | Admit: 2018-07-13 | Discharge: 2018-07-13 | Disposition: A | Discharge status: Home / Self Care | Payer: BC Managed Care – PPO | Source: Ambulatory Visit | Attending: Student | Admitting: Student

## 2018-07-13 DIAGNOSIS — M5442 Lumbago with sciatica, left side: Principal | ICD-10-CM

## 2018-07-13 DIAGNOSIS — M546 Pain in thoracic spine: Principal | ICD-10-CM

## 2018-07-13 DIAGNOSIS — G8929 Other chronic pain: Secondary | ICD-10-CM

## 2018-07-13 DIAGNOSIS — M5441 Lumbago with sciatica, right side: Principal | ICD-10-CM

## 2018-07-22 ENCOUNTER — Other Ambulatory Visit: Payer: Self-pay | Admitting: Family Medicine

## 2018-07-22 NOTE — Telephone Encounter (Signed)
Please review for refill for hydrochlorothiazide 25 mg and losartan potassium 100 mg tab. Not listed on the med list.  Hyzaar 100-25 is listed on the med list. Did not find update on these medications.

## 2018-08-01 ENCOUNTER — Other Ambulatory Visit: Payer: Self-pay | Admitting: Student

## 2018-08-01 DIAGNOSIS — G8929 Other chronic pain: Secondary | ICD-10-CM

## 2018-08-01 DIAGNOSIS — M546 Pain in thoracic spine: Principal | ICD-10-CM

## 2018-08-05 ENCOUNTER — Ambulatory Visit
Admission: RE | Admit: 2018-08-05 | Discharge: 2018-08-05 | Disposition: A | Discharge status: Home / Self Care | Payer: BC Managed Care – PPO | Source: Ambulatory Visit | Attending: Student | Admitting: Student

## 2018-08-05 DIAGNOSIS — G8929 Other chronic pain: Secondary | ICD-10-CM

## 2018-08-05 DIAGNOSIS — M546 Pain in thoracic spine: Principal | ICD-10-CM

## 2018-09-04 ENCOUNTER — Telehealth: Payer: Self-pay

## 2018-09-04 DIAGNOSIS — K7030 Alcoholic cirrhosis of liver without ascites: Secondary | ICD-10-CM

## 2018-09-04 NOTE — Telephone Encounter (Signed)
Patient stated he will be going back to [facility] tomorrow to see a surgeon to talk about removing one of his kidneys.  He is also going to [facility] to get a chest xray.  So he will be gone all day tomorrow.  Patient stated you can leave a message on his voice mail If you would like.

## 2018-09-04 NOTE — Telephone Encounter (Signed)
-----   Message from Guadalupe Maple, MD sent at 09/04/2018  3:36 PM EST ----- Regarding: call  ----- Message ----- From: Irine Seal, MD Sent: 09/04/2018   3:26 PM EST To: Guadalupe Maple, MD  I saw Henry Moore today for a left renal mass seen on recent lumbar and thoracic spine imaging.     A renal protocol CT in our office today confirms a 5cm left lower pole renal mass which is probably going to need a radical nephrectomy.   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  The radiology report was not back when I looked at the films.  The patient doesn't have a gastroenterologist and I wanted to defer to you for further evaluation.   I have ordered a CBC and CMP as well as a CXR as part of his evaluation for the probably renal cell carcinoma.   Please let me know what you think.   His CT will not be listed in Epic since it was done in our office but is available in [facility] if you open the image link on any of his other films.   [facility].

## 2018-09-04 NOTE — Telephone Encounter (Signed)
Called and left patient a VM letting him know we would try to call him again tomorrow.

## 2018-09-05 ENCOUNTER — Other Ambulatory Visit (HOSPITAL_COMMUNITY): Payer: Self-pay | Admitting: Urology

## 2018-09-05 ENCOUNTER — Ambulatory Visit (HOSPITAL_COMMUNITY)
Admission: RE | Admit: 2018-09-05 | Discharge: 2018-09-05 | Disposition: A | Discharge status: Home / Self Care | Payer: BC Managed Care – PPO | Source: Ambulatory Visit | Attending: Urology | Admitting: Urology

## 2018-09-05 DIAGNOSIS — D4102 Neoplasm of uncertain behavior of left kidney: Secondary | ICD-10-CM

## 2018-09-05 NOTE — Telephone Encounter (Signed)
Message left for patient that dr. Jeananne Rama wished to speak to him and we would call him back on Monday.

## 2018-09-09 ENCOUNTER — Encounter: Payer: Self-pay | Admitting: *Deleted

## 2018-09-09 NOTE — Telephone Encounter (Signed)
Patient walked into clinic. Will forward to provider to review w/ patient.

## 2018-09-09 NOTE — Telephone Encounter (Signed)
Discussion with patient Discussed new diagnosis of renal carcinoma has not had surgery yet will probably have in a week or 2 in the process of imaging patient was noted to have cirrhosis changes of his liver.  Patient used to drink but has not been drinking.  Will refer to gastroenterology to further evaluate.

## 2018-09-10 ENCOUNTER — Other Ambulatory Visit: Payer: Self-pay | Admitting: Urology

## 2018-09-12 ENCOUNTER — Ambulatory Visit: Payer: BC Managed Care – PPO | Admitting: Family Medicine

## 2018-09-12 ENCOUNTER — Encounter: Payer: Self-pay | Admitting: Family Medicine

## 2018-09-12 ENCOUNTER — Other Ambulatory Visit: Payer: Self-pay

## 2018-09-12 VITALS — BP 149/81 | HR 80 | Temp 99.0°F | Ht 73.0 in | Wt 248.0 lb

## 2018-09-12 DIAGNOSIS — L0292 Furuncle, unspecified: Secondary | ICD-10-CM

## 2018-09-12 MED ORDER — SULFAMETHOXAZOLE-TRIMETHOPRIM 800-160 MG PO TABS: 1.0000 | ORAL_TABLET | Freq: Two times a day (BID) | ORAL | 0 refills | Status: DC

## 2018-09-12 NOTE — Progress Notes (Signed)
BP (!) 149/81   Pulse 80   Temp 99 F (37.2 C) (Oral)   Ht 6\' 1"  (1.854 m)   Wt 248 lb (112.5 kg)   SpO2 99%   BMI 32.72 kg/m    Subjective:    Patient ID: Henry Moore, male    DOB: 1961/01/05, 58 y.o.   MRN: [id number]  HPI: Henry Moore is a 58 y.o. male  Chief Complaint  Patient presents with  . Cyst    lef side buttucks since 3-4 days ago. pt states it is getting bigger   Here today for a painful, red, swollen boil under left buttock that's been worsening for 3 days. Denies drainage, fevers, chills, sweats. Has been using neosporin with no relief. Gets these every so often per patient.   Relevant past medical, surgical, family and social history reviewed and updated as indicated. Interim medical history since our last visit reviewed. Allergies and medications reviewed and updated.  Review of Systems  Per HPI unless specifically indicated above     Objective:    BP (!) 149/81   Pulse 80   Temp 99 F (37.2 C) (Oral)   Ht 6\' 1"  (1.854 m)   Wt 248 lb (112.5 kg)   SpO2 99%   BMI 32.72 kg/m   Wt Readings from Last 3 Encounters:  09/12/18 248 lb (112.5 kg)  06/18/18 253 lb 6.4 oz (114.9 kg)  05/21/17 247 lb (112 kg)    Physical Exam Vitals signs and nursing note reviewed.  Constitutional:      Appearance: Normal appearance.  HENT:     Head: Atraumatic.  Eyes:     Extraocular Movements: Extraocular movements intact.     Conjunctiva/sclera: Conjunctivae normal.  Neck:     Musculoskeletal: Normal range of motion and neck supple.  Cardiovascular:     Rate and Rhythm: Normal rate and regular rhythm.  Pulmonary:     Effort: Pulmonary effort is normal.     Breath sounds: Normal breath sounds.  Musculoskeletal: Normal range of motion.  Skin:    General: Skin is warm and dry.     Comments: Erythematous, mildly fluctuant tender boil under left buttock. No active drainage  Neurological:     General: No focal deficit present.     Mental Status: He  is oriented to person, place, and time.  Psychiatric:        Mood and Affect: Mood normal.        Thought Content: Thought content normal.        Judgment: Judgment normal.     Results for orders placed or performed in visit on 06/18/18  Bayer DCA Hb A1c Waived  Result Value Ref Range   HB A1C (BAYER DCA - WAIVED) 5.5 <4.7 %  Basic metabolic panel  Result Value Ref Range   Glucose 183 (H) 65 - 99 mg/dL   BUN 13 6 - 24 mg/dL   Creatinine, Ser 1.03 0.76 - 1.27 mg/dL   GFR calc non Af Amer 81 >59 mL/min/1.73   GFR calc Af Amer 94 >59 mL/min/1.73   BUN/Creatinine Ratio 13 9 - 20   Sodium 132 (L) 134 - 144 mmol/L   Potassium 4.3 3.5 - 5.2 mmol/L   Chloride 96 96 - 106 mmol/L   CO2 19 (L) 20 - 29 mmol/L   Calcium 8.7 8.7 - 10.2 mg/dL  LP+ALT+AST Piccolo, Waived  Result Value Ref Range   ALT (SGPT) Piccolo, Waived 33 10 - 47  U/L   AST (SGOT) Piccolo, Waived 41 (H) 11 - 38 U/L   Cholesterol Piccolo, Waived 116 <200 mg/dL   HDL Chol Piccolo, Waived 42 (L) >59 mg/dL   Triglycerides Piccolo,Waived 123 <150 mg/dL   Chol/HDL Ratio Piccolo,Waive 2.7 mg/dL   LDL Chol Calc Piccolo Waived 49 <100 mg/dL   VLDL Chol Calc Piccolo,Waive 25 <30 mg/dL      Assessment & Plan:   Problem List Items Addressed This Visit    None    Visit Diagnoses    Boil    -  Primary   Tx with bactrim, warm compresses, sitz baths, neosporin if opening up. F/u in 1 week for recheck   Relevant Medications   sulfamethoxazole-trimethoprim (BACTRIM DS,SEPTRA DS) 800-160 MG tablet       Follow up plan: Return in about 1 week (around 09/19/2018) for wound check.

## 2018-10-01 ENCOUNTER — Encounter (HOSPITAL_COMMUNITY)
Admission: RE | Admit: 2018-10-01 | Discharge: 2018-10-01 | Disposition: A | Discharge status: Home / Self Care | Payer: BC Managed Care – PPO | Source: Ambulatory Visit | Attending: Urology | Admitting: Urology

## 2018-10-01 ENCOUNTER — Encounter (HOSPITAL_COMMUNITY): Payer: Self-pay

## 2018-10-01 ENCOUNTER — Other Ambulatory Visit: Payer: Self-pay

## 2018-10-01 DIAGNOSIS — E118 Type 2 diabetes mellitus with unspecified complications: Secondary | ICD-10-CM | POA: Diagnosis not present

## 2018-10-01 DIAGNOSIS — I10 Essential (primary) hypertension: Secondary | ICD-10-CM | POA: Diagnosis not present

## 2018-10-01 DIAGNOSIS — Z01818 Encounter for other preprocedural examination: Secondary | ICD-10-CM | POA: Insufficient documentation

## 2018-10-01 HISTORY — DX: Other specified disorders of kidney and ureter: N28.89

## 2018-10-01 HISTORY — DX: Essential (primary) hypertension: I10

## 2018-10-01 LAB — HEMOGLOBIN A1C
Hgb A1c MFr Bld: 5.5 % (ref 4.8–5.6)
Mean Plasma Glucose: 111.15 mg/dL

## 2018-10-01 LAB — BASIC METABOLIC PANEL
Anion gap: 8 (ref 5–15)
BUN: 25 mg/dL — ABNORMAL HIGH (ref 6–20)
CO2: 20 mmol/L — ABNORMAL LOW (ref 22–32)
Calcium: 9 mg/dL (ref 8.9–10.3)
Chloride: 104 mmol/L (ref 98–111)
Creatinine, Ser: 1.36 mg/dL — ABNORMAL HIGH (ref 0.61–1.24)
GFR calc Af Amer: >60 mL/min (ref >60)
GFR calc non Af Amer: 58 mL/min — ABNORMAL LOW (ref >60)
Glucose, Bld: 147 mg/dL — ABNORMAL HIGH (ref 70–99)
Potassium: 3.8 mmol/L (ref 3.5–5.1)
Sodium: 132 mmol/L — ABNORMAL LOW (ref 135–145)

## 2018-10-01 LAB — CBC
HCT: 33.2 % — ABNORMAL LOW (ref 39.0–52.0)
Hemoglobin: 11 g/dL — ABNORMAL LOW (ref 13.0–17.0)
MCH: 32.8 pg (ref 26.0–34.0)
MCHC: 33.1 g/dL (ref 30.0–36.0)
MCV: 99.1 fL (ref 80.0–100.0)
Platelets: 50 10*3/uL — ABNORMAL LOW (ref 150–400)
RBC: 3.35 MIL/uL — ABNORMAL LOW (ref 4.22–5.81)
RDW: 12.6 % (ref 11.5–15.5)
WBC: 2.1 10*3/uL — ABNORMAL LOW (ref 4.0–10.5)
nRBC: 0 % (ref 0.0–0.2)

## 2018-10-01 LAB — GLUCOSE, CAPILLARY: Glucose-Capillary: 157 mg/dL — ABNORMAL HIGH (ref 70–99)

## 2018-10-01 LAB — PROTIME-INR
INR: 1.3 — ABNORMAL HIGH (ref 0.8–1.2)
Prothrombin Time: 16.4 seconds — ABNORMAL HIGH (ref 11.4–15.2)

## 2018-10-01 LAB — ABO/RH: ABO/RH(D): O POS

## 2018-10-01 NOTE — Progress Notes (Signed)
cxr 09-05-2018 epic

## 2018-10-01 NOTE — Patient Instructions (Signed)
Henry Moore  10/01/2018   Your procedure is scheduled on: 10-07-2018   Report to [facility] Main  Entrance     Report to [place] at 5:30AM    Call this number if you have problems the morning of surgery [phone]      Remember: Do not eat food or drink liquids :After Midnight. BRUSH YOUR TEETH MORNING OF SURGERY AND RINSE YOUR MOUTH OUT, NO CHEWING GUM CANDY OR MINTS.     Take these medicines the morning of surgery with A SIP OF WATER: AMLODIPINE, CARVEDILOL    DO NOT TAKE ANY DIABETIC MEDICATIONS DAY OF YOUR SURGERY  CHECK YOUR BLOOD SUGAR THE MORNING OF SURGERY. REPORT TO YOUR NURSE ON ARRIVAL                               You may not have any metal on your body including hair pins and              piercings  Do not wear jewelry, make-up, lotions, powders or perfumes, deodorant                          Men may shave face and neck.   Do not bring valuables to the hospital. [place].  Contacts, dentures or bridgework may not be worn into surgery.  Leave suitcase in the car. After surgery it may be brought to your room.                   Please read over the following fact sheets you were given: _____________________________________________________________________             [facility] - Preparing for Surgery Before surgery, you can play an important role.  Because skin is not sterile, your skin needs to be as free of germs as possible.  You can reduce the number of germs on your skin by washing with CHG (chlorahexidine gluconate) soap before surgery.  CHG is an antiseptic cleaner which kills germs and bonds with the skin to continue killing germs even after washing. Please DO NOT use if you have an allergy to CHG or antibacterial soaps.  If your skin becomes reddened/irritated stop using the CHG and inform your nurse when you arrive at Short Stay. Do not shave (including legs and  underarms) for at least 48 hours prior to the first CHG shower.  You may shave your face/neck. Please follow these instructions carefully:  1.  Shower with CHG Soap the night before surgery and the  morning of Surgery.  2.  If you choose to wash your hair, wash your hair first as usual with your  normal  shampoo.  3.  After you shampoo, rinse your hair and body thoroughly to remove the  shampoo.                           4.  Use CHG as you would any other liquid soap.  You can apply chg directly  to the skin and wash                       Gently  with a scrungie or clean washcloth.  5.  Apply the CHG Soap to your body ONLY FROM THE NECK DOWN.   Do not use on face/ open                           Wound or open sores. Avoid contact with eyes, ears mouth and genitals (private parts).                       Wash face,  Genitals (private parts) with your normal soap.             6.  Wash thoroughly, paying special attention to the area where your surgery  will be performed.  7.  Thoroughly rinse your body with warm water from the neck down.  8.  DO NOT shower/wash with your normal soap after using and rinsing off  the CHG Soap.                9.  Pat yourself dry with a clean towel.            10.  Wear clean pajamas.            11.  Place clean sheets on your bed the night of your first shower and do not  sleep with pets. Day of Surgery : Do not apply any lotions/deodorants the morning of surgery.  Please wear clean clothes to the hospital/surgery center.  FAILURE TO FOLLOW THESE INSTRUCTIONS MAY RESULT IN THE CANCELLATION OF YOUR SURGERY PATIENT SIGNATURE_________________________________  NURSE SIGNATURE__________________________________  ________________________________________________________________________  WHAT IS A BLOOD TRANSFUSION? Blood Transfusion Information  A transfusion is the replacement of blood or some of its parts. Blood is made up of multiple cells which provide different  functions.  Red blood cells carry oxygen and are used for blood loss replacement.  White blood cells fight against infection.  Platelets control bleeding.  Plasma helps clot blood.  Other blood products are available for specialized needs, such as hemophilia or other clotting disorders. BEFORE THE TRANSFUSION  Who gives blood for transfusions?   Healthy volunteers who are fully evaluated to make sure their blood is safe. This is blood bank blood. Transfusion therapy is the safest it has ever been in the practice of medicine. Before blood is taken from a donor, a complete history is taken to make sure that person has no history of diseases nor engages in risky social behavior (examples are intravenous drug use or sexual activity with multiple partners). The donor's travel history is screened to minimize risk of transmitting infections, such as malaria. The donated blood is tested for signs of infectious diseases, such as HIV and hepatitis. The blood is then tested to be sure it is compatible with you in order to minimize the chance of a transfusion reaction. If you or a relative donates blood, this is often done in anticipation of surgery and is not appropriate for emergency situations. It takes many days to process the donated blood. RISKS AND COMPLICATIONS Although transfusion therapy is very safe and saves many lives, the main dangers of transfusion include:   Getting an infectious disease.  Developing a transfusion reaction. This is an allergic reaction to something in the blood you were given. Every precaution is taken to prevent this. The decision to have a blood transfusion has been considered carefully by your caregiver before blood is given. Blood is not given unless the benefits outweigh the  risks. AFTER THE TRANSFUSION  Right after receiving a blood transfusion, you will usually feel much better and more energetic. This is especially true if your red blood cells have gotten low  (anemic). The transfusion raises the level of the red blood cells which carry oxygen, and this usually causes an energy increase.  The nurse administering the transfusion will monitor you carefully for complications. HOME CARE INSTRUCTIONS  No special instructions are needed after a transfusion. You may find your energy is better. Speak with your caregiver about any limitations on activity for underlying diseases you may have. SEEK MEDICAL CARE IF:   Your condition is not improving after your transfusion.  You develop redness or irritation at the intravenous (IV) site. SEEK IMMEDIATE MEDICAL CARE IF:  Any of the following symptoms occur over the next 12 hours:  Shaking chills.  You have a temperature by mouth above 102 F (38.9 C), not controlled by medicine.  Chest, back, or muscle pain.  People around you feel you are not acting correctly or are confused.  Shortness of breath or difficulty breathing.  Dizziness and fainting.  You get a rash or develop hives.  You have a decrease in urine output.  Your urine turns a dark color or changes to pink, red, or brown. Any of the following symptoms occur over the next 10 days:  You have a temperature by mouth above 102 F (38.9 C), not controlled by medicine.  Shortness of breath.  Weakness after normal activity.  The white part of the eye turns yellow (jaundice).  You have a decrease in the amount of urine or are urinating less often.  Your urine turns a dark color or changes to pink, red, or brown. Document Released: 07/14/2000 Document Revised: 10/09/2011 Document Reviewed: 03/02/2008 [facility] Patient Information 2014 [place], [place].  _______________________________________________________________________

## 2018-10-07 LAB — TYPE AND SCREEN
ABO/RH(D): O POS
Antibody Screen: NEGATIVE

## 2018-10-11 ENCOUNTER — Encounter (HOSPITAL_COMMUNITY)
Admission: RE | Admit: 2018-10-11 | Discharge: 2018-10-11 | Disposition: A | Discharge status: Home / Self Care | Payer: BC Managed Care – PPO | Source: Ambulatory Visit | Attending: Urology | Admitting: Urology

## 2018-10-11 ENCOUNTER — Encounter (HOSPITAL_COMMUNITY): Payer: Self-pay

## 2018-10-11 ENCOUNTER — Other Ambulatory Visit: Payer: Self-pay

## 2018-10-11 DIAGNOSIS — Z01812 Encounter for preprocedural laboratory examination: Secondary | ICD-10-CM | POA: Insufficient documentation

## 2018-10-11 HISTORY — DX: Cough: R05

## 2018-10-11 HISTORY — DX: Thrombocytopenia, unspecified: D69.6

## 2018-10-11 HISTORY — DX: Cough, unspecified: R05.9

## 2018-10-11 HISTORY — DX: Decreased white blood cell count, unspecified: D72.819

## 2018-10-11 LAB — BASIC METABOLIC PANEL
Anion gap: 9 (ref 5–15)
BUN: 21 mg/dL — ABNORMAL HIGH (ref 6–20)
CO2: 22 mmol/L (ref 22–32)
Calcium: 9.3 mg/dL (ref 8.9–10.3)
Chloride: 104 mmol/L (ref 98–111)
Creatinine, Ser: 1.2 mg/dL (ref 0.61–1.24)
GFR calc Af Amer: >60 mL/min (ref >60)
GFR calc non Af Amer: >60 mL/min (ref >60)
Glucose, Bld: 150 mg/dL — ABNORMAL HIGH (ref 70–99)
Potassium: 4.4 mmol/L (ref 3.5–5.1)
Sodium: 135 mmol/L (ref 135–145)

## 2018-10-11 LAB — CBC
HCT: 34.4 % — ABNORMAL LOW (ref 39.0–52.0)
Hemoglobin: 11.4 g/dL — ABNORMAL LOW (ref 13.0–17.0)
MCH: 32.9 pg (ref 26.0–34.0)
MCHC: 33.1 g/dL (ref 30.0–36.0)
MCV: 99.1 fL (ref 80.0–100.0)
Platelets: 71 10*3/uL — ABNORMAL LOW (ref 150–400)
RBC: 3.47 MIL/uL — ABNORMAL LOW (ref 4.22–5.81)
RDW: 12.3 % (ref 11.5–15.5)
WBC: 2.5 10*3/uL — ABNORMAL LOW (ref 4.0–10.5)
nRBC: 0 % (ref 0.0–0.2)

## 2018-10-11 LAB — GLUCOSE, CAPILLARY: Glucose-Capillary: 155 mg/dL — ABNORMAL HIGH (ref 70–99)

## 2018-10-11 NOTE — Patient Instructions (Signed)
Henry Moore  10/11/2018   Your procedure is scheduled on: 10-16-2018   Report to [facility] Main  Entrance     Report to admitting at 6:30AM    Call this number if you have problems the morning of surgery [phone]      Remember: Do not eat food or drink liquids :After Midnight. BRUSH YOUR TEETH MORNING OF SURGERY AND RINSE YOUR MOUTH OUT, NO CHEWING GUM CANDY OR MINTS.     Take these medicines the morning of surgery with A SIP OF WATER: AMLODIPINE, CARVEDILOL      DO NOT TAKE ANY DIABETIC MEDICATIONS DAY OF YOUR SURGERY DO NOT TAKE ANY DIABETES MEDICATION THE DAY OF SURGERY! PLEASE CHECK YOUR BLOOD SUGAR THE DAY OF SURGERY. REPORT TO YOUR NURSE ON ARRIVAL.  IF YOUR BLOOD SUGAR IS LESS THAN 70 THE MORNING SURGERY WHEN YOU CHECK IT AT HOME, YOU NEED TO CALL THE [phone] FOR FURTHER INSTRUCTIONS!                                 You may not have any metal on your body including hair pins and              piercings  Do not wear jewelry, make-up, lotions, powders or perfumes, deodorant              Men may shave face and neck.   Do not bring valuables to the hospital. [place].  Contacts, dentures or bridgework may not be worn into surgery.  Leave suitcase in the car. After surgery it may be brought to your room.                  Please read over the following fact sheets you were given: _____________________________________________________________________             [facility] - Preparing for Surgery Before surgery, you can play an important role.  Because skin is not sterile, your skin needs to be as free of germs as possible.  You can reduce the number of germs on your skin by washing with CHG (chlorahexidine gluconate) soap before surgery.  CHG is an antiseptic cleaner which kills germs and bonds with the skin to continue killing germs even after washing. Please DO NOT  use if you have an allergy to CHG or antibacterial soaps.  If your skin becomes reddened/irritated stop using the CHG and inform your nurse when you arrive at Short Stay. Do not shave (including legs and underarms) for at least 48 hours prior to the first CHG shower.  You may shave your face/neck. Please follow these instructions carefully:  1.  Shower with CHG Soap the night before surgery and the  morning of Surgery.  2.  If you choose to wash your hair, wash your hair first as usual with your  normal  shampoo.  3.  After you shampoo, rinse your hair and body thoroughly to remove the  shampoo.                           4.  Use CHG as you would any other liquid soap.  You can apply chg directly  to the skin and wash                       Gently with a scrungie or clean washcloth.  5.  Apply the CHG Soap to your body ONLY FROM THE NECK DOWN.   Do not use on face/ open                           Wound or open sores. Avoid contact with eyes, ears mouth and genitals (private parts).                       Wash face,  Genitals (private parts) with your normal soap.             6.  Wash thoroughly, paying special attention to the area where your surgery  will be performed.  7.  Thoroughly rinse your body with warm water from the neck down.  8.  DO NOT shower/wash with your normal soap after using and rinsing off  the CHG Soap.                9.  Pat yourself dry with a clean towel.            10.  Wear clean pajamas.            11.  Place clean sheets on your bed the night of your first shower and do not  sleep with pets. Day of Surgery : Do not apply any lotions/deodorants the morning of surgery.  Please wear clean clothes to the hospital/surgery center.  FAILURE TO FOLLOW THESE INSTRUCTIONS MAY RESULT IN THE CANCELLATION OF YOUR SURGERY PATIENT SIGNATURE_________________________________  NURSE  SIGNATURE__________________________________  ________________________________________________________________________

## 2018-10-14 NOTE — Anesthesia Preprocedure Evaluation (Addendum)
Anesthesia Evaluation  Patient identified by MRN, date of birth, ID band Patient awake    Reviewed: Allergy & Precautions, NPO status , Patient's Chart, lab work & pertinent test results  History of Anesthesia Complications (+) PONV  Airway Mallampati: II  TM Distance: >3 FB Neck ROM: Full    Dental no notable dental hx.    Pulmonary former smoker,    Pulmonary exam normal breath sounds clear to auscultation       Cardiovascular Exercise Tolerance: Good hypertension, Pt. on medications Normal cardiovascular exam Rhythm:Regular Rate:Normal  10/01/2018 EKG NSR R 69   Neuro/Psych negative neurological ROS     GI/Hepatic negative GI ROS, (+) Cirrhosis       ,   Endo/Other  diabetesHgb A1C 5.5  Renal/GU Renal diseaseK+ 4.4 Cr 1.20     Musculoskeletal  (+) Arthritis ,   Abdominal (+) + obese,   Peds  Hematology Hgb 11.4 INR 1.3 T& S available   Anesthesia Other Findings   Reproductive/Obstetrics                          Anesthesia Physical Anesthesia Plan  ASA: III  Anesthesia Plan: General   Post-op Pain Management:    Induction: Intravenous  PONV Risk Score and Plan: 3 and Treatment may vary due to age or medical condition, Ondansetron and Dexamethasone  Airway Management Planned: Oral ETT  Additional Equipment:   Intra-op Plan:   Post-operative Plan: Extubation in OR  Informed Consent: I have reviewed the patients History and Physical, chart, labs and discussed the procedure including the risks, benefits and alternatives for the proposed anesthesia with the patient or authorized representative who has indicated his/her understanding and acceptance.     Dental advisory given  Plan Discussed with:   Anesthesia Plan Comments: (See PAT note 10/11/18, Konrad Felix, PA-C  L Laparascopic nephrectomyw GA   lidocaine drip, )      Anesthesia Quick Evaluation

## 2018-10-14 NOTE — Progress Notes (Signed)
Anesthesia Chart Review   Case:  [id number] Date/Time:  10/16/18 0815   Procedure:  LEFT HAND ASSISTED LAPAROSCOPIC NEPHRECTOMY (Left )   Anesthesia type:  General   Pre-op diagnosis:  LEFT RENAL MASS   Location:  [place] / [facility]   Surgeon:  Lucas Mallow, MD      DISCUSSION:58 yo former smoker (5 pack years, quit 07/19/15) with h/o PONV, HTN, DM II, hyperlipemia, liver cirrhosis (has been referred to GI), left renal mass scheduled for above procedure 10/16/18 with Dr. Link Snuffer.   Platelets 71 at PAT visit 10/11/18, chronically low.  Pt with liver cirrhosis and splenomegaly on recent CT, has been referred to GI.    Pt can proceed with planned procedure barring acute status change.  VS: BP 133/65   Pulse 77   Temp 36.7 C (Oral)   Resp 18   Ht 6' (1.829 m)   Wt 110.5 kg   SpO2 99%   BMI 33.02 kg/m   PROVIDERS: Guadalupe Maple, MD is PCP    LABS: Labs routed to surgeon (all labs ordered are listed, but only abnormal results are displayed)  Labs Reviewed  CBC - Abnormal; Notable for the following components:      Result Value   WBC 2.5 (*)    RBC 3.47 (*)    Hemoglobin 11.4 (*)    HCT 34.4 (*)    Platelets 71 (*)    All other components within normal limits  BASIC METABOLIC PANEL - Abnormal; Notable for the following components:   Glucose, Bld 150 (*)    BUN 21 (*)    All other components within normal limits  GLUCOSE, CAPILLARY - Abnormal; Notable for the following components:   Glucose-Capillary 155 (*)    All other components within normal limits     IMAGES: Chest Xray 09/05/2018 FINDINGS: The lungs are clear and negative for focal airspace consolidation, pulmonary edema or suspicious pulmonary nodule. No pleural effusion or pneumothorax. Cardiac and mediastinal contours are within normal limits. Atherosclerotic calcifications visualized in the transverse aorta. No acute fracture or lytic or blastic osseous lesions. The visualized upper  abdominal bowel gas pattern is unremarkable. Interval surgical changes of lower cervical stabilization. The hardware is incompletely imaged.  IMPRESSION: No active cardiopulmonary disease.  CT Abdomen Pelvis 09/04/2018 Impression:  1. A 5cm rounded enhancing solid mass in the LEFT kidney consistent with benign or malignant renal neoplasm 2. No involvement LEFT renal vein by the LEFT renal mass.  Mass contained within the pararenal space.  No lymphadenopathy 3. Morphologic changes consistent cirrhosis and portal hypertension 4. Small volume ascites and splenomegaly.   EKG: 10/01/2018 Rate 69 bpm Normal sinus rhythm  Normal ECG  Since last tracing no significant change  CV:  Past Medical History:  Diagnosis Date  . Cough    SINUS DRAINAGE CAUSING COUGHING , REPORTS CLEAR MUCOUS   . Diabetes mellitus without complication ([place])   . HTN (hypertension)   . Hyperlipemia   . Left renal mass   . Platelets decreased ([place])    DENIES UNSUAL BLEEDING   . PONV (postoperative nausea and vomiting)   . WBC decreased     Past Surgical History:  Procedure Laterality Date  . ANKLE SURGERY Left   . ANTERIOR CERVICAL DECOMP/DISCECTOMY FUSION N/A 04/16/2014   Procedure: ANTERIOR CERVICAL DECOMPRESSION/DISCECTOMY FUSION  (ACDF C5-C7)   (2 LEVELS)     ;  Surgeon: Melina Schools, MD;  Location: [place];  Service: Orthopedics;  Laterality: N/A;  . APPENDECTOMY    . HYDROCELE EXCISION    . KNEE ARTHROSCOPY Bilateral   . NASAL SINUS SURGERY     X 2  . 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 SHOULDER SURGERY Left     MEDICATIONS: . ACCU-CHEK AVIVA PLUS test strip  . amLODipine (NORVASC) 10 MG tablet  . carvedilol (COREG) 25 MG tablet  . glipiZIDE (GLUCOTROL) 5 MG tablet  . hydrochlorothiazide (HYDRODIURIL) 25 MG tablet  . losartan (COZAAR) 100 MG tablet  . losartan-hydrochlorothiazide (HYZAAR) 100-25 MG tablet  . lovastatin (MEVACOR) 40 MG tablet  . metFORMIN (GLUMETZA) 500 MG (MOD) 24  hr tablet  . sulfamethoxazole-trimethoprim (BACTRIM DS,SEPTRA DS) 800-160 MG tablet   No current facility-administered medications for this encounter.    Maia Plan [facility] Pre-Surgical Testing [phone] 10/14/18 10:42 AM

## 2018-10-15 ENCOUNTER — Other Ambulatory Visit: Payer: Self-pay

## 2018-10-15 ENCOUNTER — Ambulatory Visit: Payer: Self-pay | Admitting: Gastroenterology

## 2018-10-15 ENCOUNTER — Encounter: Payer: Self-pay | Admitting: Gastroenterology

## 2018-10-15 ENCOUNTER — Ambulatory Visit: Payer: BC Managed Care – PPO | Admitting: Gastroenterology

## 2018-10-15 VITALS — BP 114/67 | HR 76 | Ht 73.0 in | Wt 246.2 lb

## 2018-10-15 DIAGNOSIS — K760 Fatty (change of) liver, not elsewhere classified: Secondary | ICD-10-CM | POA: Diagnosis not present

## 2018-10-15 DIAGNOSIS — K7469 Other cirrhosis of liver: Secondary | ICD-10-CM | POA: Diagnosis not present

## 2018-10-15 NOTE — Progress Notes (Signed)
[name] MD, MRCP(U.K) [address]  [place], [place] [address]  Main: [phone]  Fax: [phone]   Gastroenterology Consultation  Referring Provider:     Guadalupe Maple, MD Primary Care Physician:  Guadalupe Maple, MD Primary Gastroenterologist:  Dr. [name]  Reason for Consultation:     Cirrhosis of the liver        HPI:   Henry Moore is a 58 y.o. y/o male referred for consultation & management  by Dr. Jeananne Rama, Jeannette How, MD.     He has been diagnosed with renal cell cancer- getting ready for surgery , noted to have cirrhosis of the liver on imaging and referred.Small volume of ascites noted.   Hb 11.4, Cr 1.21 , Tbil  Not available. Na 135 no inr .   Denies any tatoos, incarceration, drug use. Only drinks over the weekends , this is the heaviest he weights. Over weekend has 6 beers. Denies any other issues. Denies confusion and denies constipation has a soft bowel movement daily  Hepatic Function Latest Ref Rng & Units 06/18/2018 03/12/2017 05/13/2015  Total Protein 6.0 - 8.5 g/dL - 7.8 7.4  Albumin 3.5 - 5.5 g/dL - 4.7 4.3  AST 11 - 38 U/L 41(H) 38 44(H)  ALT 10 - 47 U/L 33 34 42  Alk Phosphatase 39 - 117 IU/L - 125(H) 120(H)  Total Bilirubin 0.0 - 1.2 mg/dL - 0.9 0.9   CBC Latest Ref Rng & Units 10/11/2018 10/01/2018 03/12/2017  WBC 4.0 - 10.5 K/uL 2.5(L) 2.1(L) 3.7  Hemoglobin 13.0 - 17.0 g/dL 11.4(L) 11.0(L) 12.6(L)  Hematocrit 39.0 - 52.0 % 34.4(L) 33.2(L) 36.5(L)  Platelets 150 - 400 K/uL 71(L) 50(L) 80(LL)     Past Medical History:  Diagnosis Date  . Cough    SINUS DRAINAGE CAUSING COUGHING , REPORTS CLEAR MUCOUS   . Diabetes mellitus without complication ([place])   . HTN (hypertension)   . Hyperlipemia   . Left renal mass   . Platelets decreased ([place])    DENIES UNSUAL BLEEDING   . PONV (postoperative nausea and vomiting)   . WBC decreased     Past Surgical History:  Procedure Laterality Date  . ANKLE SURGERY Left   .  ANTERIOR CERVICAL DECOMP/DISCECTOMY FUSION N/A 04/16/2014   Procedure: ANTERIOR CERVICAL DECOMPRESSION/DISCECTOMY FUSION  (ACDF C5-C7)   (2 LEVELS)     ;  Surgeon: Melina Schools, MD;  Location: [facility];  Service: Orthopedics;  Laterality: N/A;  . APPENDECTOMY    . HYDROCELE EXCISION    . KNEE ARTHROSCOPY Bilateral   . NASAL SINUS SURGERY     X 2  . 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 SHOULDER SURGERY Left     Prior to Admission medications   Medication Sig Start Date End Date Taking? Authorizing Provider  ACCU-CHEK AVIVA PLUS test strip CHECK BL00D SUGAR ONCE OR TWICE DAILY. Patient taking differently: 1 each by Other route as needed.  12/14/16   Johnson, Megan P, DO  amLODipine (NORVASC) 10 MG tablet Take 1 tablet (10 mg total) by mouth daily. 06/18/18   Guadalupe Maple, MD  carvedilol (COREG) 25 MG tablet Take 1 tablet (25 mg total) by mouth 2 (two) times daily with a meal. 06/18/18   Crissman, Jeannette How, MD  glipiZIDE (GLUCOTROL) 5 MG tablet Take 1 tablet (5 mg total) by mouth daily before breakfast. 06/18/18   Guadalupe Maple, MD  hydrochlorothiazide (HYDRODIURIL) 25 MG  tablet TAKE 1 TABLET DAILY. Patient not taking: Reported on 09/24/2018 07/22/18   Volney American, PA-C  losartan (COZAAR) 100 MG tablet Take 1 tablet by mouth daily. Patient not taking: Reported on 09/24/2018 07/22/18   Volney American, PA-C  losartan-hydrochlorothiazide [facility]) 100-25 MG tablet Take 1 tablet by mouth daily. 06/18/18   Guadalupe Maple, MD  lovastatin (MEVACOR) 40 MG tablet Take 1 tablet (40 mg total) by mouth at bedtime. 06/18/18   Guadalupe Maple, MD  metFORMIN (GLUMETZA) 500 MG (MOD) 24 hr tablet Take 1 tablet (500 mg total) by mouth daily with breakfast. 06/18/18   Guadalupe Maple, MD  sulfamethoxazole-trimethoprim (BACTRIM DS,SEPTRA DS) 800-160 MG tablet Take 1 tablet by mouth 2 (two) times daily. 09/12/18   Volney American, PA-C    Family History  Problem Relation Age of  Onset  . Diabetes Mother   . Cancer Father        brain  . Cancer Maternal Grandmother   . Alzheimer's disease Paternal Grandmother      Social History   Tobacco Use  . Smoking status: Former Smoker    Packs/day: 0.25    Years: 20.00    Pack years: 5.00    Last attempt to quit: 07/19/2015    Years since quitting: 3.2  . Smokeless tobacco: Former Systems developer    Quit date: 05/12/1985  Substance Use Topics  . Alcohol use: Yes    Alcohol/week: 0.0 standard drinks    Comment: weekends , '" I HAVENT HAD A BEER IN 2 MONTHS "   . Drug use: No    Allergies as of 10/15/2018 - Review Complete 10/01/2018  Allergen Reaction Noted  . Codeine Itching 04/14/2014  . Mucinex [guaifenesin er] Anxiety 05/21/2017    Review of Systems:    All systems reviewed and negative except where noted in HPI.   Physical Exam:  There were no vitals taken for this visit. No LMP for male patient. Psych:  Alert and cooperative. Normal mood and affect. General:   Alert,  Well-developed, well-nourished, pleasant and cooperative in NAD Head:  Normocephalic and atraumatic. Eyes:  Sclera clear, no icterus.   Conjunctiva pink. Ears:  Normal auditory acuity. Nose:  No deformity, discharge, or lesions. Mouth:  No deformity or lesions,oropharynx pink & moist. Neck:  Supple; no masses or thyromegaly. Lungs:  Respirations even and unlabored.  Clear throughout to auscultation.   No wheezes, crackles, or rhonchi. No acute distress. Heart:  Regular rate and rhythm; no murmurs, clicks, rubs, or gallops. Abdomen:  Normal bowel sounds.  No bruits.  Soft, non-tender and non-distended without masses, hepatosplenomegaly or hernias noted.  No guarding or rebound tenderness.     Neurologic:  Alert and oriented x3;  grossly normal neurologically. Skin:  Intact without significant lesions or rashes. No jaundice. Lymph Nodes:  No significant cervical adenopathy. Psych:  Alert and cooperative. Normal mood and affect.  Imaging  Studies: No results found.  Assessment and Plan:   Henry Moore is a 58 y.o. y/o male has been referred for cirrhosis of the liver. Likely NAFLD.   Plan 1. Viral,autoimmmune and genetic liver disease screening with labs 2. Low salt diet  3. CMP,INR to calculate MELD score and perioperative risk 4. Stop all alcohol 5. Low salt diet  6. Daily weights. 7. NAFLD leaflet  8. Will plan for EGD to screen for varices once coronovirus outbreak has eased.   Follow up in 2-3 months   Dr Bailey Mech  [name] MD,MRCP(U.K)

## 2018-10-15 NOTE — Patient Instructions (Signed)
Fatty Liver Disease  Fatty liver disease occurs when too much fat has built up in your liver cells. Fatty liver disease is also called hepatic steatosis or steatohepatitis. The liver removes harmful substances from your bloodstream and produces fluids that your body needs. It also helps your body use and store energy from the food you eat. In many cases, fatty liver disease does not cause symptoms or problems. It is often diagnosed when tests are being done for other reasons. However, over time, fatty liver can cause inflammation that may lead to more serious liver problems, such as scarring of the liver (cirrhosis) and liver failure. Fatty liver is associated with insulin resistance, increased body fat, high blood pressure (hypertension), and high cholesterol. These are features of metabolic syndrome and increase your risk for stroke, diabetes, and heart disease. What are the causes? This condition may be caused by:  Drinking too much alcohol.  Poor nutrition.  Obesity.  Cushing's syndrome.  Diabetes.  High cholesterol.  Certain drugs.  Poisons.  Some viral infections.  Pregnancy. What increases the risk? You are more likely to develop this condition if you:  Abuse alcohol.  Are overweight.  Have diabetes.  Have hepatitis.  Have a high triglyceride level.  Are pregnant. What are the signs or symptoms? Fatty liver disease often does not cause symptoms. If symptoms do develop, they can include:  Fatigue.  Weakness.  Weight loss.  Confusion.  Abdominal pain.  Nausea and vomiting.  Yellowing of your skin and the white parts of your eyes (jaundice).  Itchy skin. How is this diagnosed? This condition may be diagnosed by:  A physical exam and medical history.  Blood tests.  Imaging tests, such as an ultrasound, CT scan, or MRI.  A liver biopsy. A small sample of liver tissue is removed using a needle. The sample is then looked at under a microscope. How  is this treated? Fatty liver disease is often caused by other health conditions. Treatment for fatty liver may involve medicines and lifestyle changes to manage conditions such as:  Alcoholism.  High cholesterol.  Diabetes.  Being overweight or obese. Follow these instructions at home:   Do not drink alcohol. If you have trouble quitting, ask your health care provider how to safely quit with the help of medicine or a supervised program. This is important to keep your condition from getting worse.  Eat a healthy diet as told by your health care provider. Ask your health care provider about working with a diet and nutrition specialist (dietitian) to develop an eating plan.  Exercise regularly. This can help you lose weight and control your cholesterol and diabetes. Talk to your health care provider about an exercise plan and which activities are best for you.  Take over-the-counter and prescription medicines only as told by your health care provider.  Keep all follow-up visits as told by your health care provider. This is important. Contact a health care provider if: You have trouble controlling your:  Blood sugar. This is especially important if you have diabetes.  Cholesterol.  Drinking of alcohol. Get help right away if:  You have abdominal pain.  You have jaundice.  You have nausea and vomiting.  You vomit blood or material that looks like coffee grounds.  You have stools that are black, tar-like, or bloody. Summary  Fatty liver disease develops when too much fat builds up in the cells of your liver.  Fatty liver disease often causes no symptoms or problems. However, over   time, fatty liver can cause inflammation that may lead to more serious liver problems, such as scarring of the liver (cirrhosis).  You are more likely to develop this condition if you abuse alcohol, are pregnant, are overweight, have diabetes, have hepatitis, or have high triglyceride  levels.  Contact your health care provider if you have trouble controlling your weight, blood sugar, cholesterol, or drinking of alcohol. This information is not intended to replace advice given to you by your health care provider. Make sure you discuss any questions you have with your health care provider. Document Released: 09/01/2005 Document Revised: 04/25/2017 Document Reviewed: 04/25/2017 Elsevier Interactive Patient Education  2019 Elsevier Inc.  

## 2018-10-15 NOTE — Progress Notes (Signed)
Patient denies any symptoms of COVID 19. He is instructed that only one family member to come with him to the hospital. He states this will be an issue. RN explains new measures taken to prevent the spread of COVID 19.

## 2018-10-16 ENCOUNTER — Inpatient Hospital Stay (HOSPITAL_COMMUNITY)
Admission: RE | Admit: 2018-10-16 | Discharge: 2018-10-21 | DRG: 657 | Disposition: A | Discharge status: Home / Self Care | Payer: BC Managed Care – PPO | Attending: Urology | Admitting: Urology

## 2018-10-16 ENCOUNTER — Inpatient Hospital Stay (HOSPITAL_COMMUNITY): Payer: BC Managed Care – PPO | Admitting: Physician Assistant

## 2018-10-16 ENCOUNTER — Encounter (HOSPITAL_COMMUNITY)
Admission: RE | Disposition: A | Discharge status: Home / Self Care | Payer: Self-pay | Source: Home / Self Care | Attending: Urology

## 2018-10-16 ENCOUNTER — Encounter (HOSPITAL_COMMUNITY): Payer: Self-pay | Admitting: Certified Registered Nurse Anesthetist

## 2018-10-16 ENCOUNTER — Inpatient Hospital Stay (HOSPITAL_COMMUNITY): Payer: BC Managed Care – PPO | Admitting: Anesthesiology

## 2018-10-16 DIAGNOSIS — Z79899 Other long term (current) drug therapy: Secondary | ICD-10-CM

## 2018-10-16 DIAGNOSIS — Z885 Allergy status to narcotic agent status: Secondary | ICD-10-CM | POA: Diagnosis not present

## 2018-10-16 DIAGNOSIS — Z7984 Long term (current) use of oral hypoglycemic drugs: Secondary | ICD-10-CM | POA: Diagnosis not present

## 2018-10-16 DIAGNOSIS — E119 Type 2 diabetes mellitus without complications: Secondary | ICD-10-CM | POA: Diagnosis present

## 2018-10-16 DIAGNOSIS — N2889 Other specified disorders of kidney and ureter: Secondary | ICD-10-CM | POA: Diagnosis present

## 2018-10-16 DIAGNOSIS — Z905 Acquired absence of kidney: Secondary | ICD-10-CM | POA: Diagnosis not present

## 2018-10-16 DIAGNOSIS — C642 Malignant neoplasm of left kidney, except renal pelvis: Secondary | ICD-10-CM | POA: Diagnosis present

## 2018-10-16 DIAGNOSIS — R3129 Other microscopic hematuria: Secondary | ICD-10-CM | POA: Diagnosis present

## 2018-10-16 DIAGNOSIS — Z87891 Personal history of nicotine dependence: Secondary | ICD-10-CM

## 2018-10-16 DIAGNOSIS — R161 Splenomegaly, not elsewhere classified: Secondary | ICD-10-CM | POA: Diagnosis present

## 2018-10-16 DIAGNOSIS — D696 Thrombocytopenia, unspecified: Secondary | ICD-10-CM | POA: Diagnosis present

## 2018-10-16 DIAGNOSIS — Z888 Allergy status to other drugs, medicaments and biological substances status: Secondary | ICD-10-CM | POA: Diagnosis not present

## 2018-10-16 DIAGNOSIS — D630 Anemia in neoplastic disease: Secondary | ICD-10-CM | POA: Diagnosis present

## 2018-10-16 DIAGNOSIS — K7460 Unspecified cirrhosis of liver: Secondary | ICD-10-CM | POA: Diagnosis present

## 2018-10-16 DIAGNOSIS — K766 Portal hypertension: Secondary | ICD-10-CM | POA: Diagnosis present

## 2018-10-16 DIAGNOSIS — R188 Other ascites: Secondary | ICD-10-CM | POA: Diagnosis present

## 2018-10-16 HISTORY — PX: LAPAROSCOPIC NEPHRECTOMY, HAND ASSISTED: SHX1929

## 2018-10-16 LAB — HEMOGLOBIN AND HEMATOCRIT, BLOOD
HCT: 26 % — ABNORMAL LOW (ref 39.0–52.0)
HCT: 24.7 % — ABNORMAL LOW (ref 39.0–52.0)
Hemoglobin: 8.9 g/dL — ABNORMAL LOW (ref 13.0–17.0)
Hemoglobin: 8.4 g/dL — ABNORMAL LOW (ref 13.0–17.0)

## 2018-10-16 LAB — POCT I-STAT EG7
Acid-base deficit: 5 mmol/L — ABNORMAL HIGH (ref 0.0–2.0)
Bicarbonate: 22.1 mmol/L (ref 20.0–28.0)
Calcium, Ion: 1.17 mmol/L (ref 1.15–1.40)
HCT: 26 % — ABNORMAL LOW (ref 39.0–52.0)
Hemoglobin: 8.8 g/dL — ABNORMAL LOW (ref 13.0–17.0)
O2 Saturation: 74 %
Potassium: 5 mmol/L (ref 3.5–5.1)
Sodium: 137 mmol/L (ref 135–145)
TCO2: 23 mmol/L (ref 22–32)
pCO2, Ven: 46.9 mmHg (ref 44.0–60.0)
pH, Ven: 7.28 (ref 7.250–7.430)
pO2, Ven: 45 mmHg (ref 32.0–45.0)

## 2018-10-16 LAB — MRSA PCR SCREENING: MRSA by PCR: POSITIVE — AB

## 2018-10-16 LAB — GLUCOSE, CAPILLARY
Glucose-Capillary: 156 mg/dL — ABNORMAL HIGH (ref 70–99)
Glucose-Capillary: 195 mg/dL — ABNORMAL HIGH (ref 70–99)

## 2018-10-16 LAB — POCT I-STAT 4, (NA,K, GLUC, HGB,HCT)
Glucose, Bld: 196 mg/dL — ABNORMAL HIGH (ref 70–99)
HCT: 27 % — ABNORMAL LOW (ref 39.0–52.0)
Hemoglobin: 9.2 g/dL — ABNORMAL LOW (ref 13.0–17.0)
Potassium: 5.6 mmol/L — ABNORMAL HIGH (ref 3.5–5.1)
Sodium: 137 mmol/L (ref 135–145)

## 2018-10-16 LAB — PREPARE RBC (CROSSMATCH)

## 2018-10-16 SURGERY — NEPHRECTOMY, HAND-ASSISTED, LAPAROSCOPIC
Anesthesia: General | Laterality: Left

## 2018-10-16 MED ORDER — FENTANYL CITRATE (PF) 100 MCG/2ML IJ SOLN: 25.0000 ug | INTRAMUSCULAR | Status: DC | PRN

## 2018-10-16 MED ORDER — SODIUM CHLORIDE 0.9% IV SOLUTION: Freq: Once | INTRAVENOUS | Status: DC

## 2018-10-16 MED ORDER — SODIUM CHLORIDE 0.9% FLUSH
INTRAVENOUS | Status: DC | PRN
  Administered 2018-10-16: 20 mL

## 2018-10-16 MED ORDER — MEPERIDINE HCL 50 MG/ML IJ SOLN: 6.2500 mg | INTRAMUSCULAR | Status: DC | PRN

## 2018-10-16 MED ORDER — ALBUMIN HUMAN 5 % IV SOLN
INTRAVENOUS | Status: DC | PRN
  Administered 2018-10-16 (×2): via INTRAVENOUS

## 2018-10-16 MED ORDER — EPHEDRINE 5 MG/ML INJ
INTRAVENOUS | Status: AC
  Filled 2018-10-16: qty 30

## 2018-10-16 MED ORDER — DEXAMETHASONE SODIUM PHOSPHATE 10 MG/ML IJ SOLN
INTRAMUSCULAR | Status: DC | PRN
  Administered 2018-10-16: 5 mg via INTRAVENOUS

## 2018-10-16 MED ORDER — GABAPENTIN 300 MG PO CAPS
300.0000 mg | ORAL_CAPSULE | Freq: Once | ORAL | Status: AC
  Administered 2018-10-16: 300 mg via ORAL
  Filled 2018-10-16: qty 1

## 2018-10-16 MED ORDER — FENTANYL CITRATE (PF) 250 MCG/5ML IJ SOLN
INTRAMUSCULAR | Status: AC
  Filled 2018-10-16: qty 5

## 2018-10-16 MED ORDER — VASOPRESSIN 20 UNIT/ML IV SOLN
INTRAVENOUS | Status: AC
  Filled 2018-10-16: qty 1

## 2018-10-16 MED ORDER — PHENYLEPHRINE 40 MCG/ML (10ML) SYRINGE FOR IV PUSH (FOR BLOOD PRESSURE SUPPORT)
PREFILLED_SYRINGE | INTRAVENOUS | Status: DC | PRN
  Administered 2018-10-16: 80 ug via INTRAVENOUS
  Administered 2018-10-16 (×4): 120 ug via INTRAVENOUS
  Administered 2018-10-16: 80 ug via INTRAVENOUS
  Administered 2018-10-16: 120 ug via INTRAVENOUS

## 2018-10-16 MED ORDER — HYDROMORPHONE HCL 1 MG/ML IJ SOLN
0.5000 mg | INTRAMUSCULAR | Status: DC | PRN
  Administered 2018-10-16 – 2018-10-17 (×3): 1 mg via INTRAVENOUS
  Filled 2018-10-16 (×3): qty 1

## 2018-10-16 MED ORDER — SUCCINYLCHOLINE CHLORIDE 20 MG/ML IJ SOLN
INTRAMUSCULAR | Status: DC | PRN
  Administered 2018-10-16: 120 mg via INTRAVENOUS

## 2018-10-16 MED ORDER — SODIUM CHLORIDE 0.9 % IV SOLN
INTRAVENOUS | Status: DC | PRN
  Administered 2018-10-16: 50 ug/min via INTRAVENOUS

## 2018-10-16 MED ORDER — OXYCODONE HCL 5 MG/5ML PO SOLN: 5.0000 mg | Freq: Once | ORAL | Status: DC | PRN

## 2018-10-16 MED ORDER — LIDOCAINE 2% (20 MG/ML) 5 ML SYRINGE
INTRAMUSCULAR | Status: DC | PRN
  Administered 2018-10-16: 100 mg via INTRAVENOUS

## 2018-10-16 MED ORDER — LACTATED RINGERS IV SOLN
INTRAVENOUS | Status: DC | PRN
  Administered 2018-10-16 (×2): via INTRAVENOUS

## 2018-10-16 MED ORDER — MIDAZOLAM HCL 5 MG/5ML IJ SOLN
INTRAMUSCULAR | Status: DC | PRN
  Administered 2018-10-16: 2 mg via INTRAVENOUS

## 2018-10-16 MED ORDER — ONDANSETRON HCL 4 MG/2ML IJ SOLN
INTRAMUSCULAR | Status: AC
  Filled 2018-10-16: qty 2

## 2018-10-16 MED ORDER — DEXTROSE-NACL 5-0.45 % IV SOLN
INTRAVENOUS | Status: DC
  Administered 2018-10-16 – 2018-10-18 (×2): via INTRAVENOUS

## 2018-10-16 MED ORDER — BUPIVACAINE LIPOSOME 1.3 % IJ SUSP
20.0000 mL | Freq: Once | INTRAMUSCULAR | Status: AC
  Administered 2018-10-16: 20 mL
  Filled 2018-10-16: qty 20

## 2018-10-16 MED ORDER — KETAMINE HCL 10 MG/ML IJ SOLN
INTRAMUSCULAR | Status: AC
  Filled 2018-10-16: qty 1

## 2018-10-16 MED ORDER — OXYCODONE HCL 5 MG PO TABS
5.0000 mg | ORAL_TABLET | ORAL | Status: DC | PRN
  Filled 2018-10-16: qty 1

## 2018-10-16 MED ORDER — ALBUMIN HUMAN 5 % IV SOLN
INTRAVENOUS | Status: AC
  Filled 2018-10-16: qty 250

## 2018-10-16 MED ORDER — ONDANSETRON HCL 4 MG/2ML IJ SOLN
INTRAMUSCULAR | Status: DC | PRN
  Administered 2018-10-16: 4 mg via INTRAVENOUS

## 2018-10-16 MED ORDER — ACETAMINOPHEN 500 MG PO TABS
1000.0000 mg | ORAL_TABLET | Freq: Once | ORAL | Status: AC
  Administered 2018-10-16: 1000 mg via ORAL
  Filled 2018-10-16: qty 2

## 2018-10-16 MED ORDER — PROPOFOL 10 MG/ML IV BOLUS
INTRAVENOUS | Status: AC
  Filled 2018-10-16: qty 20

## 2018-10-16 MED ORDER — CEFAZOLIN SODIUM-DEXTROSE 2-4 GM/100ML-% IV SOLN
2.0000 g | INTRAVENOUS | Status: AC
  Administered 2018-10-16: 2 g via INTRAVENOUS
  Filled 2018-10-16: qty 100

## 2018-10-16 MED ORDER — ALBUMIN HUMAN 5 % IV SOLN
12.5000 g | Freq: Once | INTRAVENOUS | Status: AC
  Administered 2018-10-16: 12.5 g via INTRAVENOUS

## 2018-10-16 MED ORDER — ONDANSETRON HCL 4 MG/2ML IJ SOLN: 4.0000 mg | Freq: Once | INTRAMUSCULAR | Status: DC | PRN

## 2018-10-16 MED ORDER — EPHEDRINE SULFATE-NACL 50-0.9 MG/10ML-% IV SOSY
PREFILLED_SYRINGE | INTRAVENOUS | Status: DC | PRN
  Administered 2018-10-16 (×2): 15 mg via INTRAVENOUS
  Administered 2018-10-16 (×3): 10 mg via INTRAVENOUS
  Administered 2018-10-16: 15 mg via INTRAVENOUS
  Administered 2018-10-16 (×2): 10 mg via INTRAVENOUS
  Administered 2018-10-16 (×2): 15 mg via INTRAVENOUS

## 2018-10-16 MED ORDER — ROCURONIUM BROMIDE 10 MG/ML (PF) SYRINGE
PREFILLED_SYRINGE | INTRAVENOUS | Status: DC | PRN
  Administered 2018-10-16 (×2): 10 mg via INTRAVENOUS
  Administered 2018-10-16: 50 mg via INTRAVENOUS
  Administered 2018-10-16 (×2): 10 mg via INTRAVENOUS

## 2018-10-16 MED ORDER — OXYCODONE HCL 5 MG PO TABS: 5.0000 mg | ORAL_TABLET | Freq: Once | ORAL | Status: DC | PRN

## 2018-10-16 MED ORDER — MIDAZOLAM HCL 2 MG/2ML IJ SOLN
INTRAMUSCULAR | Status: AC
  Filled 2018-10-16: qty 2

## 2018-10-16 MED ORDER — SUGAMMADEX SODIUM 200 MG/2ML IV SOLN
INTRAVENOUS | Status: DC | PRN
  Administered 2018-10-16: 200 mg via INTRAVENOUS

## 2018-10-16 MED ORDER — LACTATED RINGERS IV SOLN
INTRAVENOUS | Status: DC
  Administered 2018-10-16 (×4): via INTRAVENOUS

## 2018-10-16 MED ORDER — KETAMINE HCL 10 MG/ML IJ SOLN
INTRAMUSCULAR | Status: DC | PRN
  Administered 2018-10-16: 10 mg via INTRAVENOUS

## 2018-10-16 MED ORDER — SODIUM CHLORIDE (PF) 0.9 % IJ SOLN
INTRAMUSCULAR | Status: AC
  Filled 2018-10-16: qty 20

## 2018-10-16 MED ORDER — ONDANSETRON HCL 4 MG/2ML IJ SOLN: 4.0000 mg | INTRAMUSCULAR | Status: DC | PRN

## 2018-10-16 MED ORDER — VASOPRESSIN 20 UNIT/ML IV SOLN
INTRAVENOUS | Status: DC | PRN
  Administered 2018-10-16: .03 [IU]/min via INTRAVENOUS

## 2018-10-16 MED ORDER — LIDOCAINE 2% (20 MG/ML) 5 ML SYRINGE
INTRAMUSCULAR | Status: DC | PRN
  Administered 2018-10-16: 6 mL/h via INTRAVENOUS

## 2018-10-16 MED ORDER — KETOROLAC TROMETHAMINE 30 MG/ML IJ SOLN: 30.0000 mg | Freq: Once | INTRAMUSCULAR | Status: DC | PRN

## 2018-10-16 MED ORDER — FENTANYL CITRATE (PF) 100 MCG/2ML IJ SOLN
INTRAMUSCULAR | Status: DC | PRN
  Administered 2018-10-16: 100 ug via INTRAVENOUS

## 2018-10-16 MED ORDER — PHENYLEPHRINE 40 MCG/ML (10ML) SYRINGE FOR IV PUSH (FOR BLOOD PRESSURE SUPPORT)
PREFILLED_SYRINGE | INTRAVENOUS | Status: AC
  Filled 2018-10-16: qty 30

## 2018-10-16 MED ORDER — ACETAMINOPHEN 500 MG PO TABS
1000.0000 mg | ORAL_TABLET | Freq: Four times a day (QID) | ORAL | Status: AC
  Administered 2018-10-17 (×2): 1000 mg via ORAL
  Filled 2018-10-16 (×2): qty 2

## 2018-10-16 MED ORDER — PROPOFOL 10 MG/ML IV BOLUS
INTRAVENOUS | Status: DC | PRN
  Administered 2018-10-16: 200 mg via INTRAVENOUS

## 2018-10-16 SURGICAL SUPPLY — 65 items
BAG LAPAROSCOPIC 12 15 PORT 16 (BASKET) IMPLANT
BAG RETRIEVAL 12/15 (BASKET)
BAG ZIPLOCK 12X15 (MISCELLANEOUS) IMPLANT
BLADE EXTENDED COATED 6.5IN (ELECTRODE) IMPLANT
BLADE SURG SZ10 CARB STEEL (BLADE) IMPLANT
CABLE HIGH FREQUENCY MONO STRZ (ELECTRODE) ×4 IMPLANT
CHLORAPREP W/TINT 26ML (MISCELLANEOUS) ×2 IMPLANT
CLEANER TIP ELECTROSURG 2X2 (MISCELLANEOUS) IMPLANT
CLIP VESOLOCK LG 6/CT PURPLE (CLIP) ×4 IMPLANT
CLIP VESOLOCK MED LG 6/CT (CLIP) IMPLANT
CLIP VESOLOCK XL 6/CT (CLIP) IMPLANT
COVER SURGICAL LIGHT HANDLE (MISCELLANEOUS) ×2 IMPLANT
COVER WAND RF STERILE (DRAPES) IMPLANT
CUTTER FLEX LINEAR 45M (STAPLE) ×2 IMPLANT
DERMABOND ADVANCED (GAUZE/BANDAGES/DRESSINGS) ×1
DERMABOND ADVANCED .7 DNX12 (GAUZE/BANDAGES/DRESSINGS) ×1 IMPLANT
DRAIN CHANNEL 10F 3/8 F FF (DRAIN) IMPLANT
DRAPE INCISE IOBAN 66X45 STRL (DRAPES) ×6 IMPLANT
DRSG TEGADERM 4X4.75 (GAUZE/BANDAGES/DRESSINGS) IMPLANT
DRSG TELFA 4X10 ISLAND STR (GAUZE/BANDAGES/DRESSINGS) ×2 IMPLANT
ELECT PENCIL ROCKER SW 15FT (MISCELLANEOUS) ×2 IMPLANT
ELECT REM PT RETURN 15FT ADLT (MISCELLANEOUS) ×2 IMPLANT
EVACUATOR SILICONE 100CC (DRAIN) IMPLANT
GLOVE BIO SURGEON STRL SZ 6.5 (GLOVE) IMPLANT
GLOVE BIO SURGEON STRL SZ7.5 (GLOVE) ×10 IMPLANT
GLOVE BIOGEL M STRL SZ7.5 (GLOVE) ×4 IMPLANT
GLOVE BIOGEL PI IND STRL 7.5 (GLOVE) IMPLANT
GLOVE BIOGEL PI IND STRL 8 (GLOVE) ×1 IMPLANT
GLOVE BIOGEL PI INDICATOR 7.5 (GLOVE)
GLOVE BIOGEL PI INDICATOR 8 (GLOVE) ×1
GOWN STRL REUS W/TWL LRG LVL3 (GOWN DISPOSABLE) ×4 IMPLANT
GOWN STRL REUS W/TWL XL LVL3 (GOWN DISPOSABLE) ×4 IMPLANT
HEMOSTAT SURGICEL 4X8 (HEMOSTASIS) ×2 IMPLANT
HOLDER FOLEY CATH W/STRAP (MISCELLANEOUS) ×2 IMPLANT
IRRIG SUCT STRYKERFLOW 2 WTIP (MISCELLANEOUS) ×2
IRRIGATION SUCT STRKRFLW 2 WTP (MISCELLANEOUS) ×1 IMPLANT
IV LACTATED RINGERS 1000ML (IV SOLUTION) ×2 IMPLANT
KIT BASIN OR (CUSTOM PROCEDURE TRAY) ×2 IMPLANT
KIT TURNOVER KIT A (KITS) IMPLANT
LIGASURE VESSEL 5MM BLUNT TIP (ELECTROSURGICAL) ×2 IMPLANT
LUBRICANT JELLY K Y 4OZ (MISCELLANEOUS) ×8 IMPLANT
NS IRRIG 1000ML POUR BTL (IV SOLUTION) ×2 IMPLANT
POUCH SPECIMEN RETRIEVAL 10MM (ENDOMECHANICALS) IMPLANT
PROTECTOR NERVE ULNAR (MISCELLANEOUS) ×2 IMPLANT
RELOAD 45 VASCULAR/THIN (ENDOMECHANICALS) ×10 IMPLANT
SCISSORS LAP 5X35 DISP (ENDOMECHANICALS) IMPLANT
SET TUBE SMOKE EVAC HIGH FLOW (TUBING) ×2 IMPLANT
SPONGE LAP 18X18 RF (DISPOSABLE) ×8 IMPLANT
STAPLER VISISTAT 35W (STAPLE) ×4 IMPLANT
SURGIFLO W/THROMBIN 8M KIT (HEMOSTASIS) ×2 IMPLANT
SUT ETHILON 3 0 PS 1 (SUTURE) IMPLANT
SUT MNCRL AB 4-0 PS2 18 (SUTURE) ×4 IMPLANT
SUT PDS AB 1 TP1 96 (SUTURE) ×6 IMPLANT
SUT VIC AB 2-0 SH 27 (SUTURE)
SUT VIC AB 2-0 SH 27X BRD (SUTURE) IMPLANT
SUT VICRYL 0 UR6 27IN ABS (SUTURE) IMPLANT
SYS LAPSCP GELPORT 120MM (MISCELLANEOUS) ×2
SYSTEM LAPSCP GELPORT 120MM (MISCELLANEOUS) ×1 IMPLANT
TOWEL OR 17X26 10 PK STRL BLUE (TOWEL DISPOSABLE) ×2 IMPLANT
TRAY FOLEY MTR SLVR 16FR STAT (SET/KITS/TRAYS/PACK) ×2 IMPLANT
TRAY LAPAROSCOPIC (CUSTOM PROCEDURE TRAY) ×2 IMPLANT
TROCAR BLADELESS OPT 5 100 (ENDOMECHANICALS) IMPLANT
TROCAR UNIVERSAL OPT 12M 100M (ENDOMECHANICALS) ×2 IMPLANT
TROCAR XCEL 12X100 BLDLESS (ENDOMECHANICALS) ×2 IMPLANT
YANKAUER SUCT BULB TIP 10FT TU (MISCELLANEOUS) ×2 IMPLANT

## 2018-10-16 NOTE — Transfer of Care (Signed)
Immediate Anesthesia Transfer of Care Note  Patient: Henry Moore  Procedure(s) Performed: LEFT HAND ASSISTED LAPAROSCOPIC NEPHRECTOMY (Left )  Patient Location: PACU  Anesthesia Type:General  Level of Consciousness: awake, alert  and oriented  Airway & Oxygen Therapy: Patient Spontanous Breathing and Patient connected to face mask oxygen  Post-op Assessment: Report given to RN and Post -op Vital signs reviewed and stable  Post vital signs: Reviewed and stable  Last Vitals:  Vitals Value Taken Time  BP 92/59 10/16/2018 12:50 PM  Temp    Pulse 64 10/16/2018 12:51 PM  Resp 19 10/16/2018 12:51 PM  SpO2 100 % 10/16/2018 12:51 PM  Vitals shown include unvalidated device data.  Last Pain:  Vitals:   10/16/18 0656  TempSrc:   PainSc: 0-No pain         Complications: No apparent anesthesia complications

## 2018-10-16 NOTE — Op Note (Addendum)
Operative Note  Preoperative diagnosis:  1.  Left renal mass  Postoperative diagnosis: 1.  Left renal mass  Procedure(s): 1.  Left hand-assisted laparoscopic radical nephrectomy  Surgeon: Link Snuffer, MD  Assistants: Ellison Hughs, MD  Resident: Darlina Guys, MD, resident  Consultant: Armandina Gemma, MD, general surgery  Anesthesia: General  Complications: None immediate  EBL: 2 L  Specimens: 1.  Left kidney  Drains/Catheters: 1.  Foley catheter  Fluids: 2 units PRBC  Intraoperative findings: 1.  Surrounding tissue around the kidney was inflammatory and quite adherent to the kidney making dissection quite difficult.  I was able to identify the hilum and isolated however.  No bleeding observed at the end of the case when pressure was released to 5.  Indication: 59 year old male was found to have an incidental left renal mass.  This was confirmed to be enhancing by CT.  After discussion of different options, he elected to undergo the above operation.   Description of procedure:  The patient was identified and consent was obtained.  The patient was taken to the operating room and placed in the supine position.  The patient was placed under general anesthesia.  Perioperative antibiotics were administered.  The patient was placed in left lateral position at approximately 65 degrees and all pressure points were padded.  Patient was prepped and draped in a standard sterile fashion and a timeout was performed.  An 8 cm periumbilical incision was made sharply into the skin.  This was carried down with Bovie electrocautery down to the anterior rectus sheath which was divided with electrocautery.  The underlying musculature was separated in the midline.  Sharp dissection with Metzenbaum scissors was used to open up the posterior sheath and peritoneum.  This was extended with electrocautery taking great care not to use cautery near the bowel.  The hand assist port was secured into  the incision.  I made sure no bowel was trapped within this.  A 12 mm port was inserted through the hand assist port and the abdomen was insufflated to a pressure of 15.  A 12 mm port was placed lateral as well as superior to the hand assist port, each 1 about a hand width away. Please note that all ports were placed under direct visualization with the camera.  The colon was first dissected medially by incising along the white line of [place]t.  The mesentery lateral to the colon was quite adherent and thick.  Dissection was difficult due to this.  During medialization of the colon, a small serosal tear was made but no mucosa was identified and the overlying muscular layer was intact.  The mucosa was also intact.  After medializing the colon, the kidney was dissected laterally and medially as well as superiorly.  Inferior attachments as well as the ureter and gonadal vein were divided with LigaSure device. I continued to carefully dissect medially and identified the renal hilum.  During the entire dissection, tissue planes were very thick and inflammatory and adherent to the surrounding kidney.  I was able to identify the renal vein and renal artery were divided with a 45 mm vascular staple load.  Superior attachments were then released using LigaSure device as well as blunt dissection.  Once the entire kidney and surrounding Gerota's fascia was freed, the specimen was withdrawn from the midline incision and passed off for permanent specimen.  The abdomen was reinspected and no active bleeding was noted.  Surgicel and floSeal was applied to the nephrectomy bed.  I  consulted general surgery, Dr. Harlow Asa.  He inspected the area of colon that I was concerned about.  This confirmed a serosal tear but mucosa was intact as was the overlying musculature.  Decision was made to be conservative and observe the patient closely.  Therefore, the midline fascia was closed with running 0 looped PDS suture followed by staples.  The  port incisions were closed with a 2-0 Vicryl followed by staples.  Exparel was instilled for anesthetic effect.  A dressing was applied.  Patient tolerated the procedure well and was stable postoperatively.  Plan: Stat labs will be obtained.  Diet will be slowly advanced.  I will recheck hemoglobin tonight to ensure it is stable.

## 2018-10-16 NOTE — Anesthesia Postprocedure Evaluation (Signed)
Anesthesia Post Note  Patient: Tobyn Osgood Mccallum  Procedure(s) Performed: LEFT HAND ASSISTED LAPAROSCOPIC NEPHRECTOMY (Left )     Patient location during evaluation: PACU Anesthesia Type: General Level of consciousness: awake and alert Pain management: pain level controlled Vital Signs Assessment: post-procedure vital signs reviewed and stable Respiratory status: spontaneous breathing, nonlabored ventilation, respiratory function stable and patient connected to nasal cannula oxygen Cardiovascular status: blood pressure returned to baseline and stable Postop Assessment: no apparent nausea or vomiting Anesthetic complications: no    Last Vitals:  Vitals:   10/16/18 1451 10/16/18 1500  BP:  129/62  Pulse: 69 69  Resp: 19 16  Temp:    SpO2: 99% 100%    Last Pain:  Vitals:   10/16/18 1439  TempSrc: Oral  PainSc: 0-No pain                 Barnet Glasgow

## 2018-10-16 NOTE — H&P (Signed)
CC/HPI: CC: Left renal Mass.  HPI:  09/05/2018  Referral from Dr. Jeffie Pollock. Patient has a left 5 cm enhancing renal mass. It is centralized and not amenable to partial nephrectomy. There is no obvious involvement of the left renal vein. He also had some morphologic changes consistent with cirrhosis and portal hypertension. There is small volume ascites and splenomegaly. Patient has had an appendectomy in the past but otherwise no abdominal surgeries. He has diabetes that is controlled. He is not on any blood thinners. No cardiovascular history.     ALLERGIES: Codeine    MEDICATIONS: Amlodipine Besylate 10 mg tablet  Carvedilol 25 mg tablet  Glipizide Er 5 mg tablet, extended release 24 hr  Losartan-Hydrochlorothiazide 100 mg-25 mg tablet  Lovastatin 40 mg tablet  Metformin Er Gastric 500 mg tablet, er gastric retention 24 hr     GU PSH: Locm 300-399Mg /Ml Iodine,1Ml - 09/04/2018      PSH Notes: Cervical Fusion  Penile Prothesis, Inflatable  Hydrocellle   NON-GU PSH: Appendectomy Knee Arthroscopy/surgery, Bilateral Sinus surgery    GU PMH: Left uncertain neoplasm of kidney, He has a probable LLP renal cell CA with involvement of the hilum. I believe he will need a radical nephrectomy and will have him see Dr. Gloriann Loan. I will get a CXR, CBC and CMP today. - 09/04/2018    NON-GU PMH: Other ascites, He has ascites and possible portal hypertension. I have notified his PCP and will await his advice. - 09/04/2018 Diabetes Type 2 Hypercholesterolemia Hypertension    FAMILY HISTORY: Brain Cancer - Father Diabetes - Mother   SOCIAL HISTORY: Marital Status: Married Preferred Language: English; Ethnicity: Not Hispanic Or Latino; Race: White Current Smoking Status: Patient does not smoke anymore. Has not smoked since 08/28/2018.   Tobacco Use Assessment Completed: Used Tobacco in last 30 days? Does not use smokeless tobacco. Light Drinker.  Does not use drugs. Drinks 1 caffeinated drink per  day.    REVIEW OF SYSTEMS:    GU Review Male:   Patient denies frequent urination, hard to postpone urination, burning/ pain with urination, get up at night to urinate, leakage of urine, stream starts and stops, trouble starting your stream, have to strain to urinate , erection problems, and penile pain.  Gastrointestinal (Upper):   Patient denies nausea, vomiting, and indigestion/ heartburn.  Gastrointestinal (Lower):   Patient denies constipation and diarrhea.  Constitutional:   Patient denies fever, night sweats, weight loss, and fatigue.  Skin:   Patient denies skin rash/ lesion and itching.  Eyes:   Patient denies blurred vision and double vision.  Ears/ Nose/ Throat:   Patient denies sore throat and sinus problems.  Hematologic/Lymphatic:   Patient denies swollen glands and easy bruising.  Cardiovascular:   Patient denies leg swelling and chest pains.  Respiratory:   Patient denies cough and shortness of breath.  Endocrine:   Patient denies excessive thirst.  Musculoskeletal:   Patient denies back pain and joint pain.  Neurological:   Patient denies headaches and dizziness.  Psychologic:   Patient denies depression and anxiety.   VITAL SIGNS: None   MULTI-SYSTEM PHYSICAL EXAMINATION:    Constitutional: Well-nourished. No physical deformities. Normally developed. Good grooming.  Respiratory: No labored breathing, no use of accessory muscles.   Cardiovascular: Normal temperature, adequate perfusion of extremities  Skin: No paleness, no jaundice, no cyanosis. No lesion, no ulcer, no rash.  Neurologic / Psychiatric: Oriented to time, oriented to place, oriented to person. No depression, no anxiety, no agitation.  Gastrointestinal: No mass, no tenderness, no rigidity, obese abdomen. No obvious abdominal scars  Eyes: Normal conjunctivae. Normal eyelids.  Musculoskeletal: Normal gait and station of head and neck.     PAST DATA REVIEWED:  Source Of History:  Patient  X-Ray Review:  C.T. Abdomen/Pelvis: Reviewed Films. Reviewed Report. Discussed With Patient.     PROCEDURES:         Flexible Cystoscopy - 52000  Risks, benefits, and some of the potential complications of the procedure were discussed at length with the patient including infection, bleeding, voiding discomfort, urinary retention, fever, chills, sepsis, and others. All questions were answered. Informed consent was obtained. Antibiotic prophylaxis was given. Sterile technique and intraurethral analgesia were used.  Meatus:  Normal size. Normal location. Normal condition.  Urethra:  No strictures.  External Sphincter:  Normal.  Verumontanum:  Normal.  Prostate:  Borderline obstructing. Mild hyperplasia.  Bladder Neck:  Non-obstructing.  Ureteral Orifices:  Normal location. Normal size. Normal shape.   Bladder:  No trabeculation. No tumors. Normal mucosa. No stones.      The lower urinary tract was carefully examined. The procedure was well-tolerated and without complications. Antibiotic instructions were given. Instructions were given to call the office immediately for bloody urine, difficulty urinating, urinary retention, painful or frequent urination, fever, chills, nausea, vomiting or other illness. The patient stated that he understood these instructions and would comply with them.         Urinalysis w/Scope - 81001 Dipstick Dipstick Cont'd Micro  Color: Amber Bilirubin: Neg WBC/hpf: NS (Not Seen)  Appearance: Clear Ketones: Neg RBC/hpf: 3 - 10/hpf  Specific Gravity: 1.015 Blood: 3+ Bacteria: Few (10-25/hpf)  pH: 6.0 Protein: Neg Cystals: NS (Not Seen)  Glucose: Neg Urobilinogen: 0.2 Casts: NS (Not Seen)    Nitrites: Neg Trichomonas: Not Present    Leukocyte Esterase: Neg Mucous: Not Present      Epithelial Cells: NS (Not Seen)      Yeast: NS (Not Seen)      Sperm: Not Present    Notes:      ASSESSMENT:      ICD-10 Details  1 GU:   Left uncertain neoplasm of kidney - D41.02   2    Microscopic hematuria - R31.21    PLAN:           Document Letter(s):  Created for Patient: Clinical Summary         Notes:   Cystoscopy was negative On microscopic hematuria workup. Microscopic hematuria most likely secondary to the renal mass.   Patient would like to proceed with left hand-assisted laparoscopic radical nephrectomy. He understands potential risks including but not limited to bleeding, infection, injury to surrounding structures, need for additional procedures, renal insufficiency, low chance of needing dialysis but possible. He wishes to proceed.   I advised him to follow-up with his primary care physician for the possible cirrhosis/ascites. I'll obtain an INR prior to surgical intervention to ensure this is okay.   Chest x-ray is pending today.   Cc: Newman Pies, M.D.   Signed by Link Snuffer, III, M.D. on 09/06/18 at 4:49 PM (EST

## 2018-10-16 NOTE — Anesthesia Procedure Notes (Signed)
Procedure Name: Intubation Performed by: Gean Maidens, CRNA Pre-anesthesia Checklist: Patient identified, Emergency Drugs available, Suction available, Patient being monitored and Timeout performed Patient Re-evaluated:Patient Re-evaluated prior to induction Oxygen Delivery Method: Circle system utilized Preoxygenation: Pre-oxygenation with 100% oxygen Induction Type: IV induction and Rapid sequence Laryngoscope Size: Mac and 4 Grade View: Grade II Tube type: Oral Tube size: 7.5 mm Number of attempts: 2 Airway Equipment and Method: Stylet Placement Confirmation: ETT inserted through vocal cords under direct vision,  positive ETCO2 and breath sounds checked- equal and bilateral Secured at: 23 cm Tube secured with: Tape Dental Injury: Teeth and Oropharynx as per pre-operative assessment

## 2018-10-16 NOTE — Interval H&P Note (Signed)
History and Physical Interval Note:  10/16/2018 8:21 AM  Henry Moore  has presented today for surgery, with the diagnosis of LEFT RENAL MASS.  The various methods of treatment have been discussed with the patient and family. After consideration of risks, benefits and other options for treatment, the patient has consented to  Procedure(s): LEFT HAND ASSISTED LAPAROSCOPIC NEPHRECTOMY (Left) as a surgical intervention.  The patient's history has been reviewed, patient examined, no change in status, stable for surgery.  I have reviewed the patient's chart and labs.  Questions were answered to the patient's satisfaction.     Marton Redwood, III

## 2018-10-17 ENCOUNTER — Encounter (HOSPITAL_COMMUNITY): Payer: Self-pay | Admitting: Urology

## 2018-10-17 LAB — HEPATITIS B SURFACE ANTIBODY,QUALITATIVE: Hep B Surface Ab, Qual: NONREACTIVE

## 2018-10-17 LAB — COMPREHENSIVE METABOLIC PANEL
ALT: 24 IU/L (ref 0–44)
AST: 31 IU/L (ref 0–40)
Albumin/Globulin Ratio: 1.3 (ref 1.2–2.2)
Albumin: 4.3 g/dL (ref 3.8–4.9)
Alkaline Phosphatase: 135 IU/L — ABNORMAL HIGH (ref 39–117)
BUN/Creatinine Ratio: 16 (ref 9–20)
BUN: 22 mg/dL (ref 6–24)
Bilirubin Total: 0.8 mg/dL (ref 0.0–1.2)
CO2: 20 mmol/L (ref 20–29)
Calcium: 9.2 mg/dL (ref 8.7–10.2)
Chloride: 103 mmol/L (ref 96–106)
Creatinine, Ser: 1.41 mg/dL — ABNORMAL HIGH (ref 0.76–1.27)
GFR calc Af Amer: 64 mL/min/{1.73_m2} (ref >59)
GFR calc non Af Amer: 56 mL/min/{1.73_m2} — ABNORMAL LOW (ref >59)
Globulin, Total: 3.4 g/dL (ref 1.5–4.5)
Glucose: 201 mg/dL — ABNORMAL HIGH (ref 65–99)
Potassium: 4.4 mmol/L (ref 3.5–5.2)
Sodium: 134 mmol/L (ref 134–144)
Total Protein: 7.7 g/dL (ref 6.0–8.5)

## 2018-10-17 LAB — TYPE AND SCREEN
ABO/RH(D): O POS
Antibody Screen: NEGATIVE
Unit division: 0
Unit division: 0

## 2018-10-17 LAB — BASIC METABOLIC PANEL
Anion gap: 8 (ref 5–15)
BUN: 28 mg/dL — ABNORMAL HIGH (ref 6–20)
CO2: 19 mmol/L — ABNORMAL LOW (ref 22–32)
Calcium: 7.9 mg/dL — ABNORMAL LOW (ref 8.9–10.3)
Chloride: 108 mmol/L (ref 98–111)
Creatinine, Ser: 1.77 mg/dL — ABNORMAL HIGH (ref 0.61–1.24)
GFR calc Af Amer: 49 mL/min — ABNORMAL LOW (ref >60)
GFR calc non Af Amer: 42 mL/min — ABNORMAL LOW (ref >60)
Glucose, Bld: 238 mg/dL — ABNORMAL HIGH (ref 70–99)
Potassium: 5.2 mmol/L — ABNORMAL HIGH (ref 3.5–5.1)
Sodium: 135 mmol/L (ref 135–145)

## 2018-10-17 LAB — IGG, IGA, IGM
IgG (Immunoglobin G), Serum: 1864 mg/dL — ABNORMAL HIGH (ref 700–1600)
IgM (Immunoglobulin M), Srm: 313 mg/dL — ABNORMAL HIGH (ref 20–172)

## 2018-10-17 LAB — CBC WITH DIFFERENTIAL/PLATELET
Basophils Absolute: 0 10*3/uL (ref 0.0–0.2)
Basos: 1 %
EOS (ABSOLUTE): 0.2 10*3/uL (ref 0.0–0.4)
Eos: 6 %
Hematocrit: 31.8 % — ABNORMAL LOW (ref 37.5–51.0)
Hemoglobin: 11.1 g/dL — ABNORMAL LOW (ref 13.0–17.7)
Immature Grans (Abs): 0 10*3/uL (ref 0.0–0.1)
Immature Granulocytes: 0 %
Lymphocytes Absolute: 0.5 10*3/uL — ABNORMAL LOW (ref 0.7–3.1)
Lymphs: 18 %
MCH: 33.2 pg — ABNORMAL HIGH (ref 26.6–33.0)
MCHC: 34.9 g/dL (ref 31.5–35.7)
MCV: 95 fL (ref 79–97)
Monocytes Absolute: 0.3 10*3/uL (ref 0.1–0.9)
Monocytes: 11 %
Neutrophils Absolute: 1.8 10*3/uL (ref 1.4–7.0)
Neutrophils: 64 %
Platelets: 71 10*3/uL — CL (ref 150–450)
RBC: 3.34 x10E6/uL — ABNORMAL LOW (ref 4.14–5.80)
RDW: 12.3 % (ref 11.6–15.4)
WBC: 2.8 10*3/uL — ABNORMAL LOW (ref 3.4–10.8)

## 2018-10-17 LAB — HEPATITIS B SURFACE ANTIGEN: Hepatitis B Surface Ag: NEGATIVE

## 2018-10-17 LAB — PROTIME-INR
INR: 1.2 (ref 0.8–1.2)
Prothrombin Time: 12.1 s — ABNORMAL HIGH (ref 9.1–12.0)

## 2018-10-17 LAB — HEPATITIS B CORE ANTIBODY, TOTAL: Hep B Core Total Ab: NEGATIVE

## 2018-10-17 LAB — BPAM RBC
Blood Product Expiration Date: 202004122359
Blood Product Expiration Date: 202004122359
ISSUE DATE / TIME: 202003181040
ISSUE DATE / TIME: 202003181040
Unit Type and Rh: 5100
Unit Type and Rh: 5100

## 2018-10-17 LAB — CELIAC DISEASE PANEL
Endomysial IgA: NEGATIVE
IgA/Immunoglobulin A, Serum: 301 mg/dL (ref 90–386)
Transglutaminase IgA: <2 U/mL (ref 0–3)

## 2018-10-17 LAB — ANTI-MICROSOMAL ANTIBODY LIVER / KIDNEY: LKM1 Ab: 0.9 Units (ref 0.0–20.0)

## 2018-10-17 LAB — IRON,TIBC AND FERRITIN PANEL
Ferritin: 588 ng/mL — ABNORMAL HIGH (ref 30–400)
Iron Saturation: 43 % (ref 15–55)
Iron: 105 ug/dL (ref 38–169)
Total Iron Binding Capacity: 244 ug/dL — ABNORMAL LOW (ref 250–450)
UIBC: 139 ug/dL (ref 111–343)

## 2018-10-17 LAB — CBC
HCT: 28.1 % — ABNORMAL LOW (ref 39.0–52.0)
Hemoglobin: 9.6 g/dL — ABNORMAL LOW (ref 13.0–17.0)
MCH: 32.8 pg (ref 26.0–34.0)
MCHC: 34.2 g/dL (ref 30.0–36.0)
MCV: 95.9 fL (ref 80.0–100.0)
Platelets: 60 10*3/uL — ABNORMAL LOW (ref 150–400)
RBC: 2.93 MIL/uL — ABNORMAL LOW (ref 4.22–5.81)
RDW: 13.9 % (ref 11.5–15.5)
WBC: 6.6 10*3/uL (ref 4.0–10.5)
nRBC: 0 % (ref 0.0–0.2)

## 2018-10-17 LAB — CERULOPLASMIN: Ceruloplasmin: 27.5 mg/dL (ref 16.0–31.0)

## 2018-10-17 LAB — ANTI-SMOOTH MUSCLE ANTIBODY, IGG: Smooth Muscle Ab: 18 Units (ref 0–19)

## 2018-10-17 LAB — HEPATITIS B E ANTIBODY: Hep B E Ab: NEGATIVE

## 2018-10-17 LAB — ANA: Anti Nuclear Antibody(ANA): NEGATIVE

## 2018-10-17 LAB — GLUCOSE, CAPILLARY
Glucose-Capillary: 213 mg/dL — ABNORMAL HIGH (ref 70–99)
Glucose-Capillary: 173 mg/dL — ABNORMAL HIGH (ref 70–99)
Glucose-Capillary: 179 mg/dL — ABNORMAL HIGH (ref 70–99)

## 2018-10-17 LAB — ALPHA-1-ANTITRYPSIN: A-1 Antitrypsin: 148 mg/dL (ref 101–187)

## 2018-10-17 LAB — HIV ANTIBODY (ROUTINE TESTING W REFLEX): HIV Screen 4th Generation wRfx: NONREACTIVE

## 2018-10-17 LAB — HEPATITIS C ANTIBODY: Hep C Virus Ab: <0.1 s/co ratio (ref 0.0–0.9)

## 2018-10-17 LAB — HEPATITIS A ANTIBODY, TOTAL: Hep A Total Ab: NEGATIVE

## 2018-10-17 MED ORDER — ACETAMINOPHEN 500 MG PO TABS
1000.0000 mg | ORAL_TABLET | Freq: Four times a day (QID) | ORAL | Status: AC
  Administered 2018-10-17: 1000 mg via ORAL
  Filled 2018-10-17 (×2): qty 2

## 2018-10-17 MED ORDER — INSULIN ASPART 100 UNIT/ML ~~LOC~~ SOLN: 0.0000 [IU] | Freq: Every day | SUBCUTANEOUS | Status: DC

## 2018-10-17 MED ORDER — HEPARIN SODIUM (PORCINE) 5000 UNIT/ML IJ SOLN
5000.0000 [IU] | Freq: Three times a day (TID) | INTRAMUSCULAR | Status: DC
  Administered 2018-10-17: 5000 [IU] via SUBCUTANEOUS
  Filled 2018-10-17 (×3): qty 1

## 2018-10-17 MED ORDER — INSULIN ASPART 100 UNIT/ML ~~LOC~~ SOLN
0.0000 [IU] | Freq: Three times a day (TID) | SUBCUTANEOUS | Status: DC
  Administered 2018-10-17 – 2018-10-18 (×2): 2 [IU] via SUBCUTANEOUS
  Administered 2018-10-18 (×2): 1 [IU] via SUBCUTANEOUS
  Administered 2018-10-19: 3 [IU] via SUBCUTANEOUS
  Administered 2018-10-19 (×2): 1 [IU] via SUBCUTANEOUS
  Administered 2018-10-20 (×2): 2 [IU] via SUBCUTANEOUS
  Administered 2018-10-20 – 2018-10-21 (×2): 1 [IU] via SUBCUTANEOUS
  Administered 2018-10-21: 2 [IU] via SUBCUTANEOUS

## 2018-10-17 NOTE — Progress Notes (Signed)
Urology Progress Note   1 Day Post-Op  Subjective: NAEON. Afebrile Stable in stepdown unit.  Pressors weaned off, as well as oxygen Hemoglobins stable last night and this AM. 9.6 from 8.4 UOP 1.1L  Pain is fairly well controlled, mainly soreness No flatus yet  Objective: Vital signs in last 24 hours: Temp:  [95.5 F (35.3 C)-98.1 F (36.7 C)] 97.5 F (36.4 C) (03/19 0400) Pulse Rate:  [62-85] 79 (03/19 0700) Resp:  [8-25] 17 (03/19 0700) BP: (92-148)/(55-77) 119/60 (03/19 0700) SpO2:  [96 %-100 %] 99 % (03/19 0700) Weight:  [113.4 kg] 113.4 kg (03/18 1439)  Intake/Output from previous day: 03/18 0701 - 03/19 0700 In: 5813.8 [I.V.:4433.8; Blood:630; IV Piggyback:750] Out: 2030 [Urine:1130; Blood:900] Intake/Output this shift: No intake/output data recorded.  Physical Exam:  General: Alert and oriented CV: RRR Lungs: Clear Abdomen: Soft, appropriately tender. Midline incision covered with island dressing, minimal strikethrough.  GU: Foley in place draining clear yellow urine, slightly cocentrated  Ext: NT, No erythema  Lab Results: Recent Labs    10/16/18 1304 10/16/18 1737 10/17/18 0245  HGB 8.9* 8.4* 9.6*  HCT 26.0* 24.7* 28.1*   BMET Recent Labs    10/15/18 1325  10/16/18 1210 10/17/18 0245  NA 134   < > 137 135  K 4.4   < > 5.6* 5.2*  CL 103  --   --  108  CO2 20  --   --  19*  GLUCOSE 201*  --  196* 238*  BUN 22  --   --  28*  CREATININE 1.41*  --   --  1.77*  CALCIUM 9.2  --   --  7.9*   < > = values in this interval not displayed.     Studies/Results: No results found.  Assessment/Plan:  58 y.o. male s/p left HA radical nephrectomy on 10/08/18.  Mild colonic serosal injury intraop that did not require repair. Overall doing well post-op.   - Clear liquid diet until ROBF - mIVF at 75 cc/hr  - Discontinue foley catheter - Okay to transfer to floor bed - Ambulation TID, incentive spirometry - Daily labs, abdominal exams - SCDs, holding  medical ppx to ensure stable Hgb  Dispo: Floor   LOS: 1 day   Fredricka Bonine 10/17/2018, 7:35 AM

## 2018-10-17 NOTE — Plan of Care (Signed)
  Problem: Education: Goal: Knowledge of General Education information will improve Description Including pain rating scale, medication(s)/side effects and non-pharmacologic comfort measures Outcome: Progressing   Problem: Health Behavior/Discharge Planning: Goal: Ability to manage health-related needs will improve Outcome: Progressing   Problem: Clinical Measurements: Goal: Ability to maintain clinical measurements within normal limits will improve Outcome: Progressing Goal: Diagnostic test results will improve Outcome: Progressing   Problem: Activity: Goal: Risk for activity intolerance will decrease Outcome: Progressing   Problem: Pain Managment: Goal: General experience of comfort will improve Outcome: Progressing   Problem: Clinical Measurements: Goal: Will remain free from infection Outcome: Adequate for Discharge Goal: Respiratory complications will improve Outcome: Adequate for Discharge Goal: Cardiovascular complication will be avoided Outcome: Adequate for Discharge   Problem: Nutrition: Goal: Adequate nutrition will be maintained Outcome: Adequate for Discharge   Problem: Coping: Goal: Level of anxiety will decrease Outcome: Adequate for Discharge   Problem: Elimination: Goal: Will not experience complications related to bowel motility Outcome: Adequate for Discharge Goal: Will not experience complications related to urinary retention Outcome: Adequate for Discharge   Problem: Safety: Goal: Ability to remain free from injury will improve Outcome: Adequate for Discharge   Problem: Skin Integrity: Goal: Risk for impaired skin integrity will decrease Outcome: Adequate for Discharge

## 2018-10-18 LAB — BASIC METABOLIC PANEL
Anion gap: 7 (ref 5–15)
BUN: 36 mg/dL — ABNORMAL HIGH (ref 6–20)
CO2: 20 mmol/L — ABNORMAL LOW (ref 22–32)
Calcium: 7.7 mg/dL — ABNORMAL LOW (ref 8.9–10.3)
Chloride: 106 mmol/L (ref 98–111)
Creatinine, Ser: 1.98 mg/dL — ABNORMAL HIGH (ref 0.61–1.24)
GFR calc Af Amer: 43 mL/min — ABNORMAL LOW (ref >60)
GFR calc non Af Amer: 37 mL/min — ABNORMAL LOW (ref >60)
Glucose, Bld: 147 mg/dL — ABNORMAL HIGH (ref 70–99)
Potassium: 4.2 mmol/L (ref 3.5–5.1)
Sodium: 133 mmol/L — ABNORMAL LOW (ref 135–145)

## 2018-10-18 LAB — CBC
HCT: 21.9 % — ABNORMAL LOW (ref 39.0–52.0)
Hemoglobin: 7.4 g/dL — ABNORMAL LOW (ref 13.0–17.0)
MCH: 32.7 pg (ref 26.0–34.0)
MCHC: 33.8 g/dL (ref 30.0–36.0)
MCV: 96.9 fL (ref 80.0–100.0)
Platelets: 49 10*3/uL — ABNORMAL LOW (ref 150–400)
RBC: 2.26 MIL/uL — ABNORMAL LOW (ref 4.22–5.81)
RDW: 14.4 % (ref 11.5–15.5)
WBC: 5.3 10*3/uL (ref 4.0–10.5)
nRBC: 0 % (ref 0.0–0.2)

## 2018-10-18 LAB — GLUCOSE, CAPILLARY
Glucose-Capillary: 146 mg/dL — ABNORMAL HIGH (ref 70–99)
Glucose-Capillary: 159 mg/dL — ABNORMAL HIGH (ref 70–99)
Glucose-Capillary: 124 mg/dL — ABNORMAL HIGH (ref 70–99)
Glucose-Capillary: 113 mg/dL — ABNORMAL HIGH (ref 70–99)

## 2018-10-18 MED ORDER — HYDROCODONE-ACETAMINOPHEN 5-325 MG PO TABS: 1.0000 | ORAL_TABLET | ORAL | 0 refills | Status: DC | PRN

## 2018-10-18 NOTE — Discharge Instructions (Signed)

## 2018-10-18 NOTE — Progress Notes (Signed)
Urology Progress Note   2 Days Post-Op  Subjective: NAEON. Afebrile Minimal complaints from patient. Most discomfort near LLQ port incision Minimal flatus, no BM Has been ambulating, tolerating light clear liquids Denies dizziness, HR and BP wnl  Objective: Vital signs in last 24 hours: Temp:  [98 F (36.7 C)-98.9 F (37.2 C)] 98.2 F (36.8 C) (03/20 0501) Pulse Rate:  [78-87] 87 (03/20 0501) Resp:  [3-19] 16 (03/20 0501) BP: (112-146)/(54-72) 132/65 (03/20 0501) SpO2:  [95 %-99 %] 96 % (03/20 0501)  Intake/Output from previous day: 03/19 0701 - 03/20 0700 In: 388.3 [P.O.:240; I.V.:148.3] Out: 1075 [Urine:1075] Intake/Output this shift: No intake/output data recorded.  Physical Exam:  General: Alert and oriented CV: RRR Lungs: Clear Abdomen: Soft, appropriately tender. Midline incision clean dry and intact with staples. Port incisions stapled and intact as well. Belly soft.  GU: Foley now removed Ext: NT, No erythema  Lab Results: Recent Labs    10/16/18 1737 10/17/18 0245 10/18/18 0505  HGB 8.4* 9.6* 7.4*  HCT 24.7* 28.1* 22.8*   BMET Recent Labs    10/17/18 0245 10/18/18 0505  NA 135 133*  K 5.2* 4.2  CL 108 106  CO2 19* 20*  GLUCOSE 238* 147*  BUN 28* 36*  CREATININE 1.77* 1.98*  CALCIUM 7.9* 7.7*     Studies/Results: No results found.  Assessment/Plan:  58 y.o. male s/p left HA radical nephrectomy on 10/08/18.  Mild colonic serosal injury intraop that did not require repair. Overall doing well post-op.   - Clear liquid diet until ROBF - Re-check noon CBC - Ambulation TID, incentive spirometry - Daily labs, abdominal exams - SCD - Holding heparin ppx for now due to Hgb and platelet drop  Dispo: Floor   LOS: 2 days   Henry Moore 10/18/2018, 7:28 AM

## 2018-10-19 LAB — BASIC METABOLIC PANEL
Anion gap: 8 (ref 5–15)
BUN: 34 mg/dL — ABNORMAL HIGH (ref 6–20)
CO2: 21 mmol/L — ABNORMAL LOW (ref 22–32)
Calcium: 7.8 mg/dL — ABNORMAL LOW (ref 8.9–10.3)
Chloride: 104 mmol/L (ref 98–111)
Creatinine, Ser: 1.93 mg/dL — ABNORMAL HIGH (ref 0.61–1.24)
GFR calc Af Amer: 44 mL/min — ABNORMAL LOW (ref >60)
GFR calc non Af Amer: 38 mL/min — ABNORMAL LOW (ref >60)
Glucose, Bld: 131 mg/dL — ABNORMAL HIGH (ref 70–99)
Potassium: 3.8 mmol/L (ref 3.5–5.1)
Sodium: 133 mmol/L — ABNORMAL LOW (ref 135–145)

## 2018-10-19 LAB — CBC
HCT: 22.8 % — ABNORMAL LOW (ref 39.0–52.0)
HCT: 21.1 % — ABNORMAL LOW (ref 39.0–52.0)
Hemoglobin: 7.4 g/dL — ABNORMAL LOW (ref 13.0–17.0)
Hemoglobin: 7 g/dL — ABNORMAL LOW (ref 13.0–17.0)
MCH: 32 pg (ref 26.0–34.0)
MCH: 32.9 pg (ref 26.0–34.0)
MCHC: 32.5 g/dL (ref 30.0–36.0)
MCHC: 33.2 g/dL (ref 30.0–36.0)
MCV: 98.7 fL (ref 80.0–100.0)
MCV: 99.1 fL (ref 80.0–100.0)
Platelets: 44 10*3/uL — ABNORMAL LOW (ref 150–400)
Platelets: 57 10*3/uL — ABNORMAL LOW (ref 150–400)
RBC: 2.31 MIL/uL — ABNORMAL LOW (ref 4.22–5.81)
RBC: 2.13 MIL/uL — ABNORMAL LOW (ref 4.22–5.81)
RDW: 14.3 % (ref 11.5–15.5)
RDW: 14.4 % (ref 11.5–15.5)
WBC: 4.9 10*3/uL (ref 4.0–10.5)
WBC: 3.8 10*3/uL — ABNORMAL LOW (ref 4.0–10.5)
nRBC: 0 % (ref 0.0–0.2)
nRBC: 0 % (ref 0.0–0.2)

## 2018-10-19 LAB — GLUCOSE, CAPILLARY
Glucose-Capillary: 121 mg/dL — ABNORMAL HIGH (ref 70–99)
Glucose-Capillary: 210 mg/dL — ABNORMAL HIGH (ref 70–99)
Glucose-Capillary: 133 mg/dL — ABNORMAL HIGH (ref 70–99)
Glucose-Capillary: 147 mg/dL — ABNORMAL HIGH (ref 70–99)

## 2018-10-19 NOTE — Plan of Care (Signed)

## 2018-10-19 NOTE — Progress Notes (Addendum)
3 Days Post-Op Subjective: Patient reports feeling better. Walked the halls. Had a BM.   Objective: Vital signs in last 24 hours: Temp:  [98.4 F (36.9 C)-99.1 F (37.3 C)] 98.9 F (37.2 C) (03/21 0435) Pulse Rate:  [89-100] 97 (03/21 0435) Resp:  [15-20] 20 (03/21 0435) BP: (129-157)/(65-72) 140/72 (03/21 0435) SpO2:  [96 %-97 %] 96 % (03/21 0435)  Intake/Output from previous day: 03/20 0701 - 03/21 0700 In: 1130 [P.O.:1130] Out: 500 [Urine:500] Intake/Output this shift: Total I/O In: 300 [P.O.:300] Out: -   Physical Exam:  NAD CV - RRR Lungs - CTAB Abd-soft, NT, inc C/D/I - good BS Ext - no calf pain or swelling   Lab Results: Recent Labs    10/18/18 0505 10/18/18 1242 10/19/18 0801  HGB 7.4* 7.4* 7.0*  HCT 22.8* 21.9* 21.1*   BMET Recent Labs    10/18/18 0505 10/19/18 0801  NA 133* 133*  K 4.2 3.8  CL 106 104  CO2 20* 21*  GLUCOSE 147* 131*  BUN 36* 34*  CREATININE 1.98* 1.93*  CALCIUM 7.7* 7.8*   No results for input(s): LABPT, INR in the last 72 hours. No results for input(s): LABURIN in the last 72 hours. Results for orders placed or performed during the hospital encounter of 10/16/18  MRSA PCR Screening     Status: Abnormal   Collection Time: 10/16/18  2:59 PM  Result Value Ref Range Status   MRSA by PCR POSITIVE (A) NEGATIVE Final    Comment:        The GeneXpert MRSA Assay (FDA approved for NASAL specimens only), is one component of a comprehensive MRSA colonization surveillance program. It is not intended to diagnose MRSA infection nor to guide or monitor treatment for MRSA infections. RESULT CALLED TO, READ BACK BY AND VERIFIED WITH: R.DAVIS AT 2111 ON 10/16/18 BY N.THOMPSON Performed at [facility], [place] [address], [place], [place] [address]     Studies/Results: No results found.  Assessment/Plan:  POD#3 - LEFT HALNx -   Anemia - hgb to 7 but no dizziness with walking halls  thrombocytopenia -  platelets up some   Home this afternoon or in AM    LOS: 3 days   [name] 10/19/2018, 12:55 PM

## 2018-10-20 LAB — BASIC METABOLIC PANEL
Anion gap: 9 (ref 5–15)
BUN: 30 mg/dL — ABNORMAL HIGH (ref 6–20)
CO2: 20 mmol/L — ABNORMAL LOW (ref 22–32)
Calcium: 7.7 mg/dL — ABNORMAL LOW (ref 8.9–10.3)
Chloride: 105 mmol/L (ref 98–111)
Creatinine, Ser: 1.83 mg/dL — ABNORMAL HIGH (ref 0.61–1.24)
GFR calc Af Amer: 47 mL/min — ABNORMAL LOW (ref >60)
GFR calc non Af Amer: 41 mL/min — ABNORMAL LOW (ref >60)
Glucose, Bld: 129 mg/dL — ABNORMAL HIGH (ref 70–99)
Potassium: 3.7 mmol/L (ref 3.5–5.1)
Sodium: 134 mmol/L — ABNORMAL LOW (ref 135–145)

## 2018-10-20 LAB — GLUCOSE, CAPILLARY
Glucose-Capillary: 132 mg/dL — ABNORMAL HIGH (ref 70–99)
Glucose-Capillary: 191 mg/dL — ABNORMAL HIGH (ref 70–99)
Glucose-Capillary: 152 mg/dL — ABNORMAL HIGH (ref 70–99)
Glucose-Capillary: 124 mg/dL — ABNORMAL HIGH (ref 70–99)

## 2018-10-20 LAB — HEMOGLOBIN AND HEMATOCRIT, BLOOD
HCT: 19.9 % — ABNORMAL LOW (ref 39.0–52.0)
Hemoglobin: 6.6 g/dL — CL (ref 13.0–17.0)

## 2018-10-20 LAB — PREPARE RBC (CROSSMATCH)

## 2018-10-20 MED ORDER — SODIUM CHLORIDE 0.9% IV SOLUTION
Freq: Once | INTRAVENOUS | Status: AC
  Administered 2018-10-20: 11:00:00 via INTRAVENOUS

## 2018-10-20 NOTE — Progress Notes (Addendum)
4 Days Post-Op Subjective: Patient reports he feels well.  He had 2 more bowel movements.  Hemoglobin down to 6.6.  No chest pain or shortness of breath.  No flank pain.  No abdominal pain or calf pain.  Objective: Vital signs in last 24 hours: Temp:  [98.4 F (36.9 C)-98.6 F (37 C)] 98.6 F (37 C) (03/22 0416) Pulse Rate:  [85-93] 89 (03/22 0416) Resp:  [18-20] 18 (03/22 0416) BP: (122-140)/(62-65) 122/63 (03/22 0416) SpO2:  [96 %-98 %] 97 % (03/22 0416)  Intake/Output from previous day: 03/21 0701 - 03/22 0700 In: 300 [P.O.:300] Out: -  Intake/Output this shift: No intake/output data recorded.  Physical Exam:  Looks well.  Alert and oriented. Incisions are clean dry and intact-healing well No calf swelling  Lab Results: Recent Labs    10/18/18 1242 10/19/18 0801 10/20/18 0518  HGB 7.4* 7.0* 6.6*  HCT 21.9* 21.1* 19.9*   BMET Recent Labs    10/19/18 0801 10/20/18 0518  NA 133* 134*  K 3.8 3.7  CL 104 105  CO2 21* 20*  GLUCOSE 131* 129*  BUN 34* 30*  CREATININE 1.93* 1.83*  CALCIUM 7.8* 7.7*   No results for input(s): LABPT, INR in the last 72 hours. No results for input(s): LABURIN in the last 72 hours. Results for orders placed or performed during the hospital encounter of 10/16/18  MRSA PCR Screening     Status: Abnormal   Collection Time: 10/16/18  2:59 PM  Result Value Ref Range Status   MRSA by PCR POSITIVE (A) NEGATIVE Final    Comment:        The GeneXpert MRSA Assay (FDA approved for NASAL specimens only), is one component of a comprehensive MRSA colonization surveillance program. It is not intended to diagnose MRSA infection nor to guide or monitor treatment for MRSA infections. RESULT CALLED TO, READ BACK BY AND VERIFIED WITH: R.DAVIS AT 2111 ON 10/16/18 BY N.THOMPSON Performed at [facility], [place] [address], [place], [place] [address]     Studies/Results: No results found.  Assessment/Plan: Postoperative day  #4- LEFT hand-assisted lap nephrectomy- bowel function has returned.  He said a slow trend down in hemoglobin which we were anticipating.  He will be given 1 unit packed red blood cells today.  Check labs in the morning and likely home tomorrow if stable.   LOS: 4 days   [name] 10/20/2018, 11:07 AM

## 2018-10-20 NOTE — Progress Notes (Signed)
CRITICAL VALUE ALERT  Critical Value:  hgb 6.6  Date & Time Notied:  10/20/18   Provider Notified: Junious Silk, MD  Orders Received/Actions taken:1 PRBC ordered.

## 2018-10-20 NOTE — Plan of Care (Signed)

## 2018-10-21 LAB — CBC
HCT: 24.1 % — ABNORMAL LOW (ref 39.0–52.0)
Hemoglobin: 7.9 g/dL — ABNORMAL LOW (ref 13.0–17.0)
MCH: 32 pg (ref 26.0–34.0)
MCHC: 32.8 g/dL (ref 30.0–36.0)
MCV: 97.6 fL (ref 80.0–100.0)
Platelets: 72 10*3/uL — ABNORMAL LOW (ref 150–400)
RBC: 2.47 MIL/uL — ABNORMAL LOW (ref 4.22–5.81)
RDW: 13.9 % (ref 11.5–15.5)
WBC: 2.8 10*3/uL — ABNORMAL LOW (ref 4.0–10.5)
nRBC: 0 % (ref 0.0–0.2)

## 2018-10-21 LAB — HEMOGLOBIN AND HEMATOCRIT, BLOOD
HCT: 24.6 % — ABNORMAL LOW (ref 39.0–52.0)
Hemoglobin: 8.4 g/dL — ABNORMAL LOW (ref 13.0–17.0)

## 2018-10-21 LAB — TYPE AND SCREEN
ABO/RH(D): O POS
Antibody Screen: NEGATIVE
Unit division: 0

## 2018-10-21 LAB — BASIC METABOLIC PANEL
Anion gap: 9 (ref 5–15)
BUN: 28 mg/dL — ABNORMAL HIGH (ref 6–20)
CO2: 20 mmol/L — ABNORMAL LOW (ref 22–32)
Calcium: 8.1 mg/dL — ABNORMAL LOW (ref 8.9–10.3)
Chloride: 106 mmol/L (ref 98–111)
Creatinine, Ser: 1.64 mg/dL — ABNORMAL HIGH (ref 0.61–1.24)
GFR calc Af Amer: 54 mL/min — ABNORMAL LOW (ref >60)
GFR calc non Af Amer: 46 mL/min — ABNORMAL LOW (ref >60)
Glucose, Bld: 126 mg/dL — ABNORMAL HIGH (ref 70–99)
Potassium: 3.6 mmol/L (ref 3.5–5.1)
Sodium: 135 mmol/L (ref 135–145)

## 2018-10-21 LAB — BPAM RBC
Blood Product Expiration Date: 202004182359
ISSUE DATE / TIME: 202003221105
Unit Type and Rh: 5100

## 2018-10-21 LAB — GLUCOSE, CAPILLARY
Glucose-Capillary: 121 mg/dL — ABNORMAL HIGH (ref 70–99)
Glucose-Capillary: 159 mg/dL — ABNORMAL HIGH (ref 70–99)

## 2018-10-21 NOTE — Progress Notes (Signed)
Pt reports that he has been having frequent BMs "about every 45 minutes" throughout the night. He states that it seems like he takes a drink of water and then has to go to the bathroom shortly after. Pt denies bleeding in stools. Will continue to monitor.

## 2018-10-21 NOTE — Progress Notes (Signed)
Pt discharged from the unit via wheelchair. Discharge instructions were reviewed with pt at bedside. No questions or concerns at this time.

## 2018-10-21 NOTE — Discharge Summary (Addendum)
[facility] Discharge Summary  Admit date: 10/16/2018  Discharge date and time: 10/21/18   Discharge to: Home  Discharge Service: Urology  Discharge Attending Physician:  Dr. Gloriann Loan  Discharge  Diagnoses: left renal cell carcinoma   Secondary Diagnosis: Active Problems:   Renal mass, left   OR Procedures: Procedure(s): LEFT HAND ASSISTED LAPAROSCOPIC NEPHRECTOMY 10/16/2018   Ancillary Procedures: None   Discharge Day Services: The patient was seen and examined by the Urology team both in the morning and immediately prior to discharge.  Vital signs and laboratory values were stable and within normal limits.  The physical exam was benign and unchanged and all surgical wounds were examined.  Discharge instructions were explained and all questions answered.  Subjective  No acute events overnight. Pain Controlled. No fever or chills.  Objective Patient Vitals for the past 8 hrs:  BP Temp Temp src Pulse Resp SpO2  10/21/18 1255 (!) 144/70 98.3 F (36.8 C) Oral 85 10 100 %   Total I/O In: 360 [P.O.:360] Out: -   General Appearance:        No acute distress Lungs:                       Normal work of breathing on room air Heart:                                Regular rate and rhythm Abdomen:                         Soft, non-tender, non-distended. Incisions clean dry and intact with surgical glue Extremities:                      Warm and well perfused   Hospital Course:  The patient underwent a left hand-assisted radical nephrectomy on 10/16/2018.  The patient tolerated the procedure well, was extubated in the OR, and afterwards was taken to the PACU for routine post-surgical care. He required 2u pRBC intraoperatively and 1 additional unit on POD4. He remained hemodynamically stable. When stable the patient was transferred to the floor.   The patient did well postoperatively.  The patient's diet was slowly advanced and at the time of discharge was tolerating a regular diet.   The patient was discharged home 5 Days Post-Op, at which point was tolerating a regular solid diet, was able to void spontaneously, have adequate pain control with P.O. pain medication, and could ambulate without difficulty. The patient will follow up with [place] for post op check.   He will return in 7-10 days for staple removal.   Condition at Discharge: Improved  Discharge Medications:  Allergies as of 10/21/2018      Reactions   Codeine Itching   Mucinex [guaifenesin Er] Anxiety   anxiety      Medication List    STOP taking these medications   sulfamethoxazole-trimethoprim 800-160 MG tablet Commonly known as:  BACTRIM DS,SEPTRA DS     TAKE these medications   Accu-Chek Aviva Plus test strip Generic drug:  glucose blood CHECK BL00D SUGAR ONCE OR TWICE DAILY. What changed:  See the new instructions.   amLODipine 10 MG tablet Commonly known as:  NORVASC Take 1 tablet (10 mg total) by mouth daily.   carvedilol 25 MG tablet Commonly known as:  COREG Take 1 tablet (25 mg total) by mouth 2 (two) times  daily with a meal.   glipiZIDE 5 MG tablet Commonly known as:  GLUCOTROL Take 1 tablet (5 mg total) by mouth daily before breakfast.   hydrochlorothiazide 25 MG tablet Commonly known as:  HYDRODIURIL TAKE 1 TABLET DAILY.   HYDROcodone-acetaminophen 5-325 MG tablet Commonly known as:  NORCO/VICODIN Take 1 tablet by mouth every 4 (four) hours as needed for moderate pain.   losartan 100 MG tablet Commonly known as:  COZAAR Take 1 tablet by mouth daily.   losartan-hydrochlorothiazide 100-25 MG tablet Commonly known as:  HYZAAR Take 1 tablet by mouth daily.   lovastatin 40 MG tablet Commonly known as:  MEVACOR Take 1 tablet (40 mg total) by mouth at bedtime.   metFORMIN 500 MG (MOD) 24 hr tablet Commonly known as:  GLUMETZA Take 1 tablet (500 mg total) by mouth daily with breakfast.

## 2018-10-23 ENCOUNTER — Ambulatory Visit: Payer: Self-pay | Admitting: Gastroenterology

## 2018-10-28 ENCOUNTER — Telehealth: Payer: Self-pay

## 2018-10-28 NOTE — Telephone Encounter (Signed)
Called pt to inform him of lab results and Dr. Georgeann Oppenheim instructions for pt to receive the Hepatitis A/B vaccine as well as follow up.  Unable to contact LVM to return call

## 2018-10-28 NOTE — Telephone Encounter (Signed)
-----   Message from [name], MD sent at 10/28/2018  9:24 AM EDT ----- Cristie Hem inform not immune to Hep A/B and needs vacine- follow up after 6-8 weeks   C/c Crissman, Jeannette How, MD   Dr [name] MD,MRCP [facility]) Gastroenterology/Hepatology Pager: [phone]

## 2018-11-20 ENCOUNTER — Ambulatory Visit: Payer: Self-pay | Admitting: Gastroenterology

## 2018-12-31 ENCOUNTER — Other Ambulatory Visit: Payer: Self-pay

## 2018-12-31 ENCOUNTER — Encounter: Payer: Self-pay | Admitting: Nurse Practitioner

## 2018-12-31 ENCOUNTER — Ambulatory Visit: Payer: Self-pay | Admitting: Family Medicine

## 2018-12-31 ENCOUNTER — Ambulatory Visit (INDEPENDENT_AMBULATORY_CARE_PROVIDER_SITE_OTHER): Payer: BC Managed Care – PPO | Admitting: Nurse Practitioner

## 2018-12-31 VITALS — BP 130/78 | HR 71 | Temp 98.6°F

## 2018-12-31 DIAGNOSIS — L03031 Cellulitis of right toe: Secondary | ICD-10-CM | POA: Diagnosis not present

## 2018-12-31 DIAGNOSIS — L03039 Cellulitis of unspecified toe: Secondary | ICD-10-CM | POA: Insufficient documentation

## 2018-12-31 MED ORDER — MUPIROCIN 2 % EX OINT: 1.0000 "application " | TOPICAL_OINTMENT | Freq: Two times a day (BID) | CUTANEOUS | 0 refills | Status: DC

## 2018-12-31 MED ORDER — CEPHALEXIN 500 MG PO CAPS: 500.0000 mg | ORAL_CAPSULE | Freq: Three times a day (TID) | ORAL | 0 refills | Status: DC

## 2018-12-31 NOTE — Progress Notes (Signed)
BP 130/78   Pulse 71   Temp 98.6 F (37 C) (Oral)   SpO2 98%    Subjective:    Patient ID: Henry Moore, male    DOB: [birth year], 58 y.o.   MRN: [id number]  HPI: Henry Moore is a 58 y.o. male  Chief Complaint  Patient presents with  . Toe Pain    R big toe, patient states it is swollen and red, has neuropathy so he states he cannot feel it and does not know if he did anything to it or not   RIGHT GREAT TOE INFECTION Started on Saturday, does not recall what he did to it.  States it became swollen and then he pulled skin off of the toe and gradually has gotten worse with bleeding at toenail.  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  Not on medication for neuropathy, but is on medication for T2DM with March A1C 5.5.  Has been using peroxide and cleaning it out at home + applying abx ointment.  Tetanus up to date, due in 2023.  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  Current CrCl 68.35 based on March labs and weight. Duration: days Involved toe: rightbig toe  Mechanism of injury: unknown Onset: gradual Severity: mild  Quality: dull pain occasionally Frequency: intermittent Radiation: no Aggravating factors: weight bearing  Alleviating factors: rest  Status: stable Treatments attempted: rest  Relief with NSAIDs?: no Morning stiffness: no Redness: yes  Bruising: no Swelling: yes Paresthesias / decreased sensation: at baseline Fevers: no  Relevant past medical, surgical, family and social history reviewed and updated as indicated. Interim medical history since our last visit reviewed. Allergies and medications reviewed and updated.  Review of Systems  Constitutional: Negative for activity change, diaphoresis, fatigue and fever.  Respiratory: Negative for cough, chest tightness, shortness of breath and wheezing.   Cardiovascular: Negative for chest pain, palpitations and leg swelling.   Gastrointestinal: Negative for abdominal distention, abdominal pain, constipation, diarrhea, nausea and vomiting.  Musculoskeletal: Negative.   Skin: Positive for wound.  Neurological: Negative for dizziness, syncope, weakness, light-headedness, numbness and headaches.  Psychiatric/Behavioral: Negative.     Per HPI unless specifically indicated above     Objective:    BP 130/78   Pulse 71   Temp 98.6 F (37 C) (Oral)   SpO2 98%   Wt Readings from Last 3 Encounters:  10/16/18 250 lb (113.4 kg)  10/15/18 246 lb 3.2 oz (111.7 kg)  10/11/18 243 lb 8 oz (110.5 kg)    Physical Exam Vitals signs and nursing note reviewed.  Constitutional:      Appearance: He is well-developed.  HENT:     Head: Normocephalic and atraumatic.     Right Ear: Hearing normal. No drainage.     Left Ear: Hearing normal. No drainage.     Mouth/Throat:     Pharynx: Uvula midline.  Eyes:     General: Lids are normal.        Right eye: No discharge.        Left eye: No discharge.     Conjunctiva/sclera: Conjunctivae normal.     Pupils: Pupils are equal, round, and reactive to light.  Neck:     Musculoskeletal: Normal range of motion and neck supple.     Trachea: Trachea normal.  Cardiovascular:     Rate and Rhythm: Normal rate and regular rhythm.     Heart sounds:  Normal heart sounds, S1 normal and S2 normal. No murmur. No gallop.   Pulmonary:     Effort: Pulmonary effort is normal.     Breath sounds: Normal breath sounds.  Abdominal:     General: Bowel sounds are normal.     Palpations: Abdomen is soft. There is no hepatomegaly or splenomegaly.  Musculoskeletal: Normal range of motion.     Right lower leg: Edema (trace) present.     Left lower leg: Edema (trace) present.  Skin:    General: Skin is warm and dry.     Capillary Refill: Capillary refill takes less than 2 seconds.     Findings: Wound present. No rash.     Comments: Small open abrasion to upper aspect of right great toe.  Toe with  moderate erythema and swelling + mild warmth.  Drainage noted to distal aspect of great toenail, drainage serosang with no odor.  No tenderness to touch of toe, remaining toes intact with no drainage.  Left foot with no wounds.  Full ROM of bilateral feet.  Pulses dorsalis 1+.    Neurological:     Mental Status: He is alert and oriented to person, place, and time.     Deep Tendon Reflexes: Reflexes are normal and symmetric.  Psychiatric:        Mood and Affect: Mood normal.        Behavior: Behavior normal.        Thought Content: Thought content normal.        Judgment: Judgment normal.     Results for orders placed or performed during the hospital encounter of 10/16/18  MRSA PCR Screening  Result Value Ref Range   MRSA by PCR POSITIVE (A) NEGATIVE  Glucose, capillary  Result Value Ref Range   Glucose-Capillary 156 (H) 70 - 99 mg/dL   Comment 1 Notify RN    Comment 2 Document in Chart   Hemoglobin and hematocrit, blood  Result Value Ref Range   Hemoglobin 8.9 (L) 13.0 - 17.0 g/dL   HCT 26.0 (L) 39.0 - 52.0 %  Glucose, capillary  Result Value Ref Range   Glucose-Capillary 195 (H) 70 - 99 mg/dL  CBC  Result Value Ref Range   WBC 6.6 4.0 - 10.5 K/uL   RBC 2.93 (L) 4.22 - 5.81 MIL/uL   Hemoglobin 9.6 (L) 13.0 - 17.0 g/dL   HCT 28.1 (L) 39.0 - 52.0 %   MCV 95.9 80.0 - 100.0 fL   MCH 32.8 26.0 - 34.0 pg   MCHC 34.2 30.0 - 36.0 g/dL   RDW 13.9 11.5 - 15.5 %   Platelets 60 (L) 150 - 400 K/uL   nRBC 0.0 0.0 - 0.2 %  Basic metabolic panel  Result Value Ref Range   Sodium 135 135 - 145 mmol/L   Potassium 5.2 (H) 3.5 - 5.1 mmol/L   Chloride 108 98 - 111 mmol/L   CO2 19 (L) 22 - 32 mmol/L   Glucose, Bld 238 (H) 70 - 99 mg/dL   BUN 28 (H) 6 - 20 mg/dL   Creatinine, Ser 1.77 (H) 0.61 - 1.24 mg/dL   Calcium 7.9 (L) 8.9 - 10.3 mg/dL   GFR calc non Af Amer 42 (L) >60 mL/min   GFR calc Af Amer 49 (L) >60 mL/min   Anion gap 8 5 - 15  Hemoglobin and hematocrit, blood  Result Value  Ref Range   Hemoglobin 8.4 (L) 13.0 - 17.0 g/dL   HCT 24.7 (L)  39.0 - 52.0 %  Glucose, capillary  Result Value Ref Range   Glucose-Capillary 213 (H) 70 - 99 mg/dL  Glucose, capillary  Result Value Ref Range   Glucose-Capillary 173 (H) 70 - 99 mg/dL  CBC  Result Value Ref Range   WBC 4.9 4.0 - 10.5 K/uL   RBC 2.31 (L) 4.22 - 5.81 MIL/uL   Hemoglobin 7.4 (L) 13.0 - 17.0 g/dL   HCT 22.8 (L) 39.0 - 52.0 %   MCV 98.7 80.0 - 100.0 fL   MCH 32.0 26.0 - 34.0 pg   MCHC 32.5 30.0 - 36.0 g/dL   RDW 14.3 11.5 - 15.5 %   Platelets 44 (L) 150 - 400 K/uL   nRBC 0.0 0.0 - 0.2 %  Basic metabolic panel  Result Value Ref Range   Sodium 133 (L) 135 - 145 mmol/L   Potassium 4.2 3.5 - 5.1 mmol/L   Chloride 106 98 - 111 mmol/L   CO2 20 (L) 22 - 32 mmol/L   Glucose, Bld 147 (H) 70 - 99 mg/dL   BUN 36 (H) 6 - 20 mg/dL   Creatinine, Ser 1.98 (H) 0.61 - 1.24 mg/dL   Calcium 7.7 (L) 8.9 - 10.3 mg/dL   GFR calc non Af Amer 37 (L) >60 mL/min   GFR calc Af Amer 43 (L) >60 mL/min   Anion gap 7 5 - 15  Glucose, capillary  Result Value Ref Range   Glucose-Capillary 179 (H) 70 - 99 mg/dL  CBC  Result Value Ref Range   WBC 5.3 4.0 - 10.5 K/uL   RBC 2.26 (L) 4.22 - 5.81 MIL/uL   Hemoglobin 7.4 (L) 13.0 - 17.0 g/dL   HCT 21.9 (L) 39.0 - 52.0 %   MCV 96.9 80.0 - 100.0 fL   MCH 32.7 26.0 - 34.0 pg   MCHC 33.8 30.0 - 36.0 g/dL   RDW 14.4 11.5 - 15.5 %   Platelets 49 (L) 150 - 400 K/uL   nRBC 0.0 0.0 - 0.2 %  Glucose, capillary  Result Value Ref Range   Glucose-Capillary 146 (H) 70 - 99 mg/dL  Glucose, capillary  Result Value Ref Range   Glucose-Capillary 159 (H) 70 - 99 mg/dL  Glucose, capillary  Result Value Ref Range   Glucose-Capillary 124 (H) 70 - 99 mg/dL  Glucose, capillary  Result Value Ref Range   Glucose-Capillary 113 (H) 70 - 99 mg/dL  Glucose, capillary  Result Value Ref Range   Glucose-Capillary 121 (H) 70 - 99 mg/dL  CBC  Result Value Ref Range   WBC 3.8 (L) 4.0 - 10.5 K/uL    RBC 2.13 (L) 4.22 - 5.81 MIL/uL   Hemoglobin 7.0 (L) 13.0 - 17.0 g/dL   HCT 21.1 (L) 39.0 - 52.0 %   MCV 99.1 80.0 - 100.0 fL   MCH 32.9 26.0 - 34.0 pg   MCHC 33.2 30.0 - 36.0 g/dL   RDW 14.4 11.5 - 15.5 %   Platelets 57 (L) 150 - 400 K/uL   nRBC 0.0 0.0 - 0.2 %  Basic metabolic panel  Result Value Ref Range   Sodium 133 (L) 135 - 145 mmol/L   Potassium 3.8 3.5 - 5.1 mmol/L   Chloride 104 98 - 111 mmol/L   CO2 21 (L) 22 - 32 mmol/L   Glucose, Bld 131 (H) 70 - 99 mg/dL   BUN 34 (H) 6 - 20 mg/dL   Creatinine, Ser 1.93 (H) 0.61 - 1.24 mg/dL   Calcium  7.8 (L) 8.9 - 10.3 mg/dL   GFR calc non Af Amer 38 (L) >60 mL/min   GFR calc Af Amer 44 (L) >60 mL/min   Anion gap 8 5 - 15  Glucose, capillary  Result Value Ref Range   Glucose-Capillary 210 (H) 70 - 99 mg/dL  Glucose, capillary  Result Value Ref Range   Glucose-Capillary 133 (H) 70 - 99 mg/dL  Hemoglobin and hematocrit, blood  Result Value Ref Range   Hemoglobin 6.6 (LL) 13.0 - 17.0 g/dL   HCT 19.9 (L) 39.0 - 16.1 %  Basic metabolic panel  Result Value Ref Range   Sodium 134 (L) 135 - 145 mmol/L   Potassium 3.7 3.5 - 5.1 mmol/L   Chloride 105 98 - 111 mmol/L   CO2 20 (L) 22 - 32 mmol/L   Glucose, Bld 129 (H) 70 - 99 mg/dL   BUN 30 (H) 6 - 20 mg/dL   Creatinine, Ser 1.83 (H) 0.61 - 1.24 mg/dL   Calcium 7.7 (L) 8.9 - 10.3 mg/dL   GFR calc non Af Amer 41 (L) >60 mL/min   GFR calc Af Amer 47 (L) >60 mL/min   Anion gap 9 5 - 15  Glucose, capillary  Result Value Ref Range   Glucose-Capillary 147 (H) 70 - 99 mg/dL  Glucose, capillary  Result Value Ref Range   Glucose-Capillary 132 (H) 70 - 99 mg/dL  Glucose, capillary  Result Value Ref Range   Glucose-Capillary 191 (H) 70 - 99 mg/dL  Glucose, capillary  Result Value Ref Range   Glucose-Capillary 152 (H) 70 - 99 mg/dL  CBC  Result Value Ref Range   WBC 2.8 (L) 4.0 - 10.5 K/uL   RBC 2.47 (L) 4.22 - 5.81 MIL/uL   Hemoglobin 7.9 (L) 13.0 - 17.0 g/dL   HCT 24.1 (L) 39.0 -  52.0 %   MCV 97.6 80.0 - 100.0 fL   MCH 32.0 26.0 - 34.0 pg   MCHC 32.8 30.0 - 36.0 g/dL   RDW 13.9 11.5 - 15.5 %   Platelets 72 (L) 150 - 400 K/uL   nRBC 0.0 0.0 - 0.2 %  Basic metabolic panel  Result Value Ref Range   Sodium 135 135 - 145 mmol/L   Potassium 3.6 3.5 - 5.1 mmol/L   Chloride 106 98 - 111 mmol/L   CO2 20 (L) 22 - 32 mmol/L   Glucose, Bld 126 (H) 70 - 99 mg/dL   BUN 28 (H) 6 - 20 mg/dL   Creatinine, Ser 1.64 (H) 0.61 - 1.24 mg/dL   Calcium 8.1 (L) 8.9 - 10.3 mg/dL   GFR calc non Af Amer 46 (L) >60 mL/min   GFR calc Af Amer 54 (L) >60 mL/min   Anion gap 9 5 - 15  Glucose, capillary  Result Value Ref Range   Glucose-Capillary 124 (H) 70 - 99 mg/dL  Glucose, capillary  Result Value Ref Range   Glucose-Capillary 121 (H) 70 - 99 mg/dL  Hemoglobin and hematocrit, blood  Result Value Ref Range   Hemoglobin 8.4 (L) 13.0 - 17.0 g/dL   HCT 24.6 (L) 39.0 - 52.0 %  Glucose, capillary  Result Value Ref Range   Glucose-Capillary 159 (H) 70 - 99 mg/dL  POCT I-Stat EG7  Result Value Ref Range   pH, Ven 7.280 7.250 - 7.430   pCO2, Ven 46.9 44.0 - 60.0 mmHg   pO2, Ven 45.0 32.0 - 45.0 mmHg   Bicarbonate 22.1 20.0 - 28.0 mmol/L  TCO2 23 22 - 32 mmol/L   O2 Saturation 74.0 %   Acid-base deficit 5.0 (H) 0.0 - 2.0 mmol/L   Sodium 137 135 - 145 mmol/L   Potassium 5.0 3.5 - 5.1 mmol/L   Calcium, Ion 1.17 1.15 - 1.40 mmol/L   HCT 26.0 (L) 39.0 - 52.0 %   Hemoglobin 8.8 (L) 13.0 - 17.0 g/dL   Patient temperature HIDE    Sample type VENOUS   I-STAT 4, (NA,K, GLUC, HGB,HCT)  Result Value Ref Range   Sodium 137 135 - 145 mmol/L   Potassium 5.6 (H) 3.5 - 5.1 mmol/L   Glucose, Bld 196 (H) 70 - 99 mg/dL   HCT 27.0 (L) 39.0 - 52.0 %   Hemoglobin 9.2 (L) 13.0 - 17.0 g/dL  Type and screen [place]  Result Value Ref Range   ABO/RH(D) O POS    Antibody Screen NEG    Sample Expiration 10/19/2018    Unit Number [id number]    Blood Component Type RED  CELLS,LR    Unit division 00    Status of Unit ISSUED,FINAL    Transfusion Status OK TO TRANSFUSE    Crossmatch Result Compatible    Unit Number [id number]    Blood Component Type RED CELLS,LR    Unit division 00    Status of Unit ISSUED,FINAL    Transfusion Status OK TO TRANSFUSE    Crossmatch Result      Compatible Performed at [facility], [place] [address], [place], [place] [address]   Prepare RBC  Result Value Ref Range   Order Confirmation      ORDER PROCESSED BY BLOOD BANK Performed at [facility] [address], [place], [place] [address]   Prepare RBC  Result Value Ref Range   Order Confirmation      ORDER PROCESSED BY BLOOD BANK Performed at [facility] [address], [place], [place] [address]   Type and screen [facility]  Result Value Ref Range   ABO/RH(D) O POS    Antibody Screen NEG    Sample Expiration 10/23/2018    Unit Number [id number]    Blood Component Type RED CELLS,LR    Unit division 00    Status of Unit ISSUED,FINAL    Transfusion Status OK TO TRANSFUSE    Crossmatch Result      Compatible Performed at [facility], [place] [address], [place], [place] [address]   BPAM [facility]  Result Value Ref Range   ISSUE DATE / TIME 867619509326    Blood Product Unit Number [id number]    PRODUCT CODE [id number]    Unit Type and Rh 5100    Blood Product Expiration Date 734193790240    ISSUE DATE / TIME [id number]    Blood Product Unit Number [id number]    PRODUCT CODE X2119E17    Unit Type and Rh 5100    Blood Product Expiration Date 408144818563   BPAM RBC  Result Value Ref Range   ISSUE DATE / TIME 149702637858    Blood Product Unit Number [id number]    PRODUCT CODE [id number]    Unit Type and Rh 5100    Blood Product Expiration Date 096283662947       Assessment & Plan:   Problem List Items Addressed This Visit      Other   Cellulitis  of great toe of right foot - Primary    Acute presenting 3 days ago.  C&S of area obtained.  Will initiate treatment with Keflex 500 MG TID at this time, script sent and adjust as needed based on culture.  Mupirocin ointment sent to apply twice a day.  Recommend warm soaks of toe twice a day and keeping area clean, allowing to drain.  Monitor temperature daily.  Notify office immediately if increased redness, warmth, or swelling.  Return in office in 3 days for follow-up.        Relevant Orders   Anaerobic and Aerobic Culture       Follow up plan: Return in about 3 days (around 01/03/2019) for Wound Follow-up in office with Apolonio Schneiders or Dr. Lenna Sciara.

## 2018-12-31 NOTE — Assessment & Plan Note (Signed)
Acute presenting 3 days ago.  C&S of area obtained.  Will initiate treatment with Keflex 500 MG TID at this time, script sent and adjust as needed based on culture.  Mupirocin ointment sent to apply twice a day.  Recommend warm soaks of toe twice a day and keeping area clean, allowing to drain.  Monitor temperature daily.  Notify office immediately if increased redness, warmth, or swelling.  Return in office in 3 days for follow-up.

## 2018-12-31 NOTE — Telephone Encounter (Signed)
Pt. Reports he noticed his right Great toe started getting red, swollen, hot to touch. Peeled and cut some of his skin off. Now the toe is draining clear fluid. Warm transfer to [place] in the practice.  Answer Assessment - Initial Assessment Questions 1. SYMPTOM: "What's the main symptom you're concerned about?" (e.g., rash, sore, callus, drainage, numbness)     Right Great Toe red, swollen, hot, draining 2. LOCATION: "Where is the  swelling located?" (e.g., foot/toe, top/bottom, left/right)     Big toe and down to foot 3. ONSET: "When did the  swelling start?"     Saturday 4. PAIN: "Is there any pain?" If so, ask: "How bad is it?" (Scale: 1-10; mild, moderate, severe)     No 5. CAUSE: "What do you think is causing the symptoms?"     Unsure 6. OTHER SYMPTOMS: "Do you have any other symptoms?" (e.g., fever, weakness)     Foot hot, red, swollen 7. PREGNANCY: "Is there any chance you are pregnant?" "When was your last menstrual period?"     n/a  Protocols used: DIABETES - [place]

## 2018-12-31 NOTE — Patient Instructions (Signed)

## 2019-01-03 ENCOUNTER — Ambulatory Visit: Payer: BC Managed Care – PPO | Admitting: Family Medicine

## 2019-01-03 ENCOUNTER — Encounter: Payer: Self-pay | Admitting: Family Medicine

## 2019-01-03 ENCOUNTER — Other Ambulatory Visit: Payer: Self-pay

## 2019-01-03 VITALS — BP 125/74 | HR 68 | Temp 97.9°F | Ht 73.0 in | Wt 242.0 lb

## 2019-01-03 DIAGNOSIS — L03031 Cellulitis of right toe: Secondary | ICD-10-CM | POA: Diagnosis not present

## 2019-01-03 MED ORDER — SULFAMETHOXAZOLE-TRIMETHOPRIM 800-160 MG PO TABS: 1.0000 | ORAL_TABLET | Freq: Two times a day (BID) | ORAL | 0 refills | Status: DC

## 2019-01-03 NOTE — Progress Notes (Signed)
BP 125/74   Pulse 68   Temp 97.9 F (36.6 C) (Oral)   Ht 6\' 1"  (1.854 m)   Wt 242 lb (109.8 kg)   SpO2 98%   BMI 31.93 kg/m    Subjective:    Patient ID: Henry Moore, male    DOB: 1961/02/18, 58 y.o.   MRN: [id number]  HPI: Henry Moore is a 58 y.o. male  Chief Complaint  Patient presents with  . Toe Pain    f/u   On keflex for his toe cellulitis. No pain due to neuropathy. Tolerating medicine well. He notes that he is slightly better, but not significantly. Continues to be red and swollen. No fevers. No chills. No more bleeding. Otherwise doing well with no other concerns or complaints at this time.   Relevant past medical, surgical, family and social history reviewed and updated as indicated. Interim medical history since our last visit reviewed. Allergies and medications reviewed and updated.  Review of Systems  Constitutional: Negative.   Respiratory: Negative.   Cardiovascular: Negative.   Gastrointestinal: Negative.   Skin: Positive for color change and wound. Negative for pallor and rash.  Psychiatric/Behavioral: Negative.     Per HPI unless specifically indicated above     Objective:    BP 125/74   Pulse 68   Temp 97.9 F (36.6 C) (Oral)   Ht 6\' 1"  (1.854 m)   Wt 242 lb (109.8 kg)   SpO2 98%   BMI 31.93 kg/m   Wt Readings from Last 3 Encounters:  01/03/19 242 lb (109.8 kg)  10/16/18 250 lb (113.4 kg)  10/15/18 246 lb 3.2 oz (111.7 kg)    Physical Exam Vitals signs and nursing note reviewed.  Constitutional:      General: He is not in acute distress.    Appearance: Normal appearance. He is not ill-appearing, toxic-appearing or diaphoretic.  HENT:     Head: Normocephalic and atraumatic.     Right Ear: External ear normal.     Left Ear: External ear normal.     Nose: Nose normal.     Mouth/Throat:     Mouth: Mucous membranes are moist.     Pharynx: Oropharynx is clear.  Eyes:     General: No scleral icterus.       Right eye: No  discharge.        Left eye: No discharge.     Extraocular Movements: Extraocular movements intact.     Conjunctiva/sclera: Conjunctivae normal.     Pupils: Pupils are equal, round, and reactive to light.  Neck:     Musculoskeletal: Normal range of motion and neck supple.  Cardiovascular:     Rate and Rhythm: Normal rate and regular rhythm.     Pulses: Normal pulses.     Heart sounds: Normal heart sounds. No murmur. No friction rub. No gallop.   Pulmonary:     Effort: Pulmonary effort is normal. No respiratory distress.     Breath sounds: Normal breath sounds. No stridor. No wheezing, rhonchi or rales.  Chest:     Chest wall: No tenderness.  Musculoskeletal: Normal range of motion.  Skin:    General: Skin is warm and dry.     Capillary Refill: Capillary refill takes less than 2 seconds.     Coloration: Skin is not jaundiced or pale.     Findings: Erythema (swollen, red R great toe with ingrown nail) present. No bruising, lesion or rash.  Neurological:  General: No focal deficit present.     Mental Status: He is alert and oriented to person, place, and time. Mental status is at baseline.  Psychiatric:        Mood and Affect: Mood normal.        Behavior: Behavior normal.        Thought Content: Thought content normal.        Judgment: Judgment normal.     Results for orders placed or performed in visit on 12/31/18  Anaerobic and Aerobic Culture  Result Value Ref Range   Anaerobic Culture Preliminary report    Result 1 Comment    Aerobic Culture Preliminary report (A)    Result 1 Staphylococcus aureus (A)    Result 2 Mixed skin flora       Assessment & Plan:   Problem List Items Addressed This Visit      Other   Cellulitis of great toe of right foot - Primary    Culture growing out staph, hx of MRSA, will change to bactrim and recheck 4 days. Call with any concerns. Improving.          Follow up plan: Return 3-4 days, for follow up toe with me or Jolene.

## 2019-01-03 NOTE — Assessment & Plan Note (Signed)
Culture growing out staph, hx of MRSA, will change to bactrim and recheck 4 days. Call with any concerns. Improving.

## 2019-01-04 LAB — ANAEROBIC AND AEROBIC CULTURE

## 2019-01-07 ENCOUNTER — Encounter: Payer: Self-pay | Admitting: Family Medicine

## 2019-01-07 ENCOUNTER — Ambulatory Visit: Payer: BC Managed Care – PPO | Admitting: Family Medicine

## 2019-01-07 ENCOUNTER — Other Ambulatory Visit: Payer: Self-pay

## 2019-01-07 VITALS — BP 114/70 | HR 68 | Temp 98.4°F | Ht 73.0 in | Wt 243.0 lb

## 2019-01-07 DIAGNOSIS — L03031 Cellulitis of right toe: Secondary | ICD-10-CM

## 2019-01-07 DIAGNOSIS — E1140 Type 2 diabetes mellitus with diabetic neuropathy, unspecified: Secondary | ICD-10-CM | POA: Diagnosis not present

## 2019-01-07 DIAGNOSIS — L600 Ingrowing nail: Secondary | ICD-10-CM | POA: Diagnosis not present

## 2019-01-07 NOTE — Assessment & Plan Note (Signed)
Will get him into podiatry for toenail removal. Call with any concerns.

## 2019-01-07 NOTE — Progress Notes (Signed)
BP 114/70   Pulse 68   Temp 98.4 F (36.9 C) (Oral)   Ht 6\' 1"  (1.854 m)   Wt 243 lb (110.2 kg)   SpO2 98%   BMI 32.06 kg/m    Subjective:    Patient ID: Henry Moore, male    DOB: [birth year], 58 y.o.   MRN: [id number]  HPI: Henry Moore is a 58 y.o. male  Chief Complaint  Patient presents with  . Toe Pain    f/u   Feeling much better. Switched antibiotics and not in pain. Redness has improved. Toe is feeling better. Tolerating medicine well. No other concerns at this time.   Relevant past medical, surgical, family and social history reviewed and updated as indicated. Interim medical history since our last visit reviewed. Allergies and medications reviewed and updated.  Review of Systems  Constitutional: Negative.   Respiratory: Negative.   Cardiovascular: Negative.   Musculoskeletal: Negative.   Skin: Positive for color change and wound. Negative for pallor and rash.  Neurological: Positive for numbness. Negative for dizziness, tremors, seizures, syncope, facial asymmetry, speech difficulty, weakness, light-headedness and headaches.  Psychiatric/Behavioral: Negative.     Per HPI unless specifically indicated above     Objective:    BP 114/70   Pulse 68   Temp 98.4 F (36.9 C) (Oral)   Ht 6\' 1"  (1.854 m)   Wt 243 lb (110.2 kg)   SpO2 98%   BMI 32.06 kg/m   Wt Readings from Last 3 Encounters:  01/07/19 243 lb (110.2 kg)  01/03/19 242 lb (109.8 kg)  10/16/18 250 lb (113.4 kg)    Physical Exam Vitals signs and nursing note reviewed.  Constitutional:      General: He is not in acute distress.    Appearance: Normal appearance. He is not ill-appearing, toxic-appearing or diaphoretic.  HENT:     Head: Normocephalic and atraumatic.     Right Ear: External ear normal.     Left Ear: External ear normal.     Nose: Nose normal.     Mouth/Throat:     Mouth: Mucous membranes are moist.     Pharynx: Oropharynx is clear.  Eyes:     General: No  scleral icterus.       Right eye: No discharge.        Left eye: No discharge.     Extraocular Movements: Extraocular movements intact.     Conjunctiva/sclera: Conjunctivae normal.     Pupils: Pupils are equal, round, and reactive to light.  Neck:     Musculoskeletal: Normal range of motion and neck supple.  Cardiovascular:     Rate and Rhythm: Normal rate and regular rhythm.     Pulses: Normal pulses.     Heart sounds: Normal heart sounds. No murmur. No friction rub. No gallop.   Pulmonary:     Effort: Pulmonary effort is normal. No respiratory distress.     Breath sounds: Normal breath sounds. No stridor. No wheezing, rhonchi or rales.  Chest:     Chest wall: No tenderness.  Musculoskeletal: Normal range of motion.  Skin:    General: Skin is warm and dry.     Capillary Refill: Capillary refill takes less than 2 seconds.     Coloration: Skin is not jaundiced or pale.     Findings: No bruising, erythema, lesion or rash.     Comments: Slightly swollen, no longer red R great toe with paronychea on the medial border and ingrown  nail  Neurological:     General: No focal deficit present.     Mental Status: He is alert and oriented to person, place, and time. Mental status is at baseline.  Psychiatric:        Mood and Affect: Mood normal.        Behavior: Behavior normal.        Thought Content: Thought content normal.        Judgment: Judgment normal.     Results for orders placed or performed in visit on 12/31/18  Anaerobic and Aerobic Culture  Result Value Ref Range   Anaerobic Culture Final report    Result 1 Comment    Aerobic Culture Final report (A)    Result 1 Staphylococcus aureus (A)    Result 2 Mixed skin flora    Antimicrobial Susceptibility Comment       Assessment & Plan:   Problem List Items Addressed This Visit      Endocrine   Type 2 diabetes, controlled, with neuropathy ([place])    Will get him into podiatry for toenail removal. Call with any concerns.        Relevant Orders   Ambulatory referral to Podiatry     Other   Cellulitis of great toe of right foot - Primary   Relevant Orders   Ambulatory referral to Podiatry    Other Visit Diagnoses    Ingrown toenail of right foot       Likely needs removal- will get him in with podiatry given the neuropathy. New referral generated today.   Relevant Orders   Ambulatory referral to Podiatry       Follow up plan: Return As scheduled.

## 2019-01-16 ENCOUNTER — Other Ambulatory Visit: Payer: Self-pay

## 2019-01-16 ENCOUNTER — Ambulatory Visit (INDEPENDENT_AMBULATORY_CARE_PROVIDER_SITE_OTHER): Payer: BC Managed Care – PPO | Admitting: Gastroenterology

## 2019-01-16 ENCOUNTER — Ambulatory Visit: Payer: Self-pay | Admitting: Gastroenterology

## 2019-01-16 DIAGNOSIS — K7469 Other cirrhosis of liver: Secondary | ICD-10-CM

## 2019-01-16 DIAGNOSIS — K760 Fatty (change of) liver, not elsewhere classified: Secondary | ICD-10-CM | POA: Diagnosis not present

## 2019-01-16 NOTE — Progress Notes (Signed)
[name] , MD [address]  [place]  [place], [place] [address]  Main: [phone]  Fax: [phone]   Primary Care Physician: Guadalupe Maple, MD  Virtual Visit via Telephone Note  I connected with patient on 01/16/19 at 10:15 AM EDT by telephone and verified that I am speaking with the correct person using two identifiers.   I discussed the limitations, risks, security and privacy concerns of performing an evaluation and management service by telephone and the availability of in person appointments. I also discussed with the patient that there may be a patient responsible charge related to this service. The patient expressed understanding and agreed to proceed.  Location of Patient: Home Location of Provider: Home Persons involved: Patient and provider only   History of Present Illness: Chief Complaint  Patient presents with  . Follow-up    Fatty Liver    HPI: Henry Moore is a 58 y.o. male   Summary of history : He was initially referred and seen on 10/15/2018.  He has a diagnosis of renal cell cancer and was getting ready for surgery and was noted to have cirrhosis of the liver on imaging and hence referred.  Small volume of ascites also noted.  He denies any tattoos, incarceration or drug use.  Only drank alcohol over the weekends.  The present weight was the heaviest he has ever weighed.  Over the weekend he has had about 6 beers.  No issues with confusion suspicious of hence hepatic encephalopathy at initial presentation.  Weight in March 2020 was 246 pounds.   Interval history   10/15/2018-  01/16/2019  Underwent laparoscopic nephrectomy in 10/18/2018. 10/15/2018: Labs are negative for autoimmune hepatitis and viral hepatitis.  Not immune to hepatitis A and B.     Current Outpatient Medications  Medication Sig Dispense Refill  . ACCU-CHEK AVIVA PLUS test strip CHECK BL00D SUGAR ONCE OR TWICE DAILY. (Patient taking differently: 1 each by Other route  as needed. ) 100 each PRN  . amLODipine (NORVASC) 10 MG tablet Take 1 tablet (10 mg total) by mouth daily. 90 tablet 3  . carvedilol (COREG) 25 MG tablet Take 1 tablet (25 mg total) by mouth 2 (two) times daily with a meal. 180 tablet 3  . glipiZIDE (GLUCOTROL) 5 MG tablet Take 1 tablet (5 mg total) by mouth daily before breakfast. 90 tablet 3  . hydrochlorothiazide (HYDRODIURIL) 25 MG tablet TAKE 1 TABLET DAILY. 90 tablet 0  . losartan (COZAAR) 100 MG tablet Take 1 tablet by mouth daily. 90 tablet 0  . lovastatin (MEVACOR) 40 MG tablet Take 1 tablet (40 mg total) by mouth at bedtime. 90 tablet 3  . metFORMIN (GLUMETZA) 500 MG (MOD) 24 hr tablet Take 1 tablet (500 mg total) by mouth daily with breakfast. 90 tablet 3  . sulfamethoxazole-trimethoprim (BACTRIM DS) 800-160 MG tablet Take 1 tablet by mouth 2 (two) times daily. (Patient not taking: Reported on 01/16/2019) 20 tablet 0   No current facility-administered medications for this visit.     Allergies as of 01/16/2019 - Review Complete 01/16/2019  Allergen Reaction Noted  . Codeine Itching 04/14/2014  . Mucinex [guaifenesin er] Anxiety 05/21/2017    Review of Systems:    All systems reviewed and negative except where noted in HPI.   Observations/Objective:  Labs: CMP     Component Value Date/Time   NA 135 10/21/2018 0510   NA 134 10/15/2018 1325   K 3.6 10/21/2018 0510  CL 106 10/21/2018 0510   CO2 20 (L) 10/21/2018 0510   GLUCOSE 126 (H) 10/21/2018 0510   BUN 28 (H) 10/21/2018 0510   BUN 22 10/15/2018 1325   CREATININE 1.64 (H) 10/21/2018 0510   CALCIUM 8.1 (L) 10/21/2018 0510   PROT 7.7 10/15/2018 1325   ALBUMIN 4.3 10/15/2018 1325   AST 31 10/15/2018 1325   AST 41 (H) 06/18/2018 1111   ALT 24 10/15/2018 1325   ALT 33 06/18/2018 1111   ALKPHOS 135 (H) 10/15/2018 1325   BILITOT 0.8 10/15/2018 1325   GFRNONAA 46 (L) 10/21/2018 0510   GFRAA 54 (L) 10/21/2018 0510   Lab Results  Component Value Date   WBC 2.8 (L)  10/21/2018   HGB 8.4 (L) 10/21/2018   HCT 24.6 (L) 10/21/2018   MCV 97.6 10/21/2018   PLT 72 (L) 10/21/2018    Imaging Studies: No results found.  Assessment and Plan:   Henry Moore is a 58 y.o. y/o male here to follow-up for cirrhosis of the liver incidentally diagnosed in March 2020. Likely NAFLD.  Meld score of 15. Doing well.  Status post nephrectomy per patient report was cancer but had not spread outside the kidney undergoing surveillance scans next week.  I could not find the report on epic.  His meld score of 15 is mainly driven by worsening of his kidney function (synthetic function of his liver seems to be doing fine.  Plan 1.  Vaccinate for hepatitis A and B. 2. Low salt diet  3.  Refer to [facility] to be evaluated for transplant since his meld score is over 10. 4. Stop all alcohol 5. USG liver to screen for Home 6. EGD to screen for varices.   I have discussed alternative options, risks & benefits,  which include, but are not limited to, bleeding, infection, perforation,respiratory complication & drug reaction.  The patient agrees with this plan & written consent will be obtained.     I discussed the assessment and treatment plan with the patient. The patient was provided an opportunity to ask questions and all were answered. The patient agreed with the plan and demonstrated an understanding of the instructions.   The patient was advised to call back or seek an in-person evaluation if the symptoms worsen or if the condition fails to improve as anticipated.  I provided 13 minutes of non-face-to-face time during this encounter.  Dr [name] MD,MRCP [facility]) Gastroenterology/Hepatology Pager: [phone]   Speech recognition software was used to dictate this note.

## 2019-02-03 ENCOUNTER — Other Ambulatory Visit: Payer: Self-pay

## 2019-02-03 ENCOUNTER — Other Ambulatory Visit
Admission: RE | Admit: 2019-02-03 | Discharge: 2019-02-03 | Disposition: A | Discharge status: Home / Self Care | Payer: BC Managed Care – PPO | Source: Ambulatory Visit | Attending: Gastroenterology | Admitting: Gastroenterology

## 2019-02-03 DIAGNOSIS — Z1159 Encounter for screening for other viral diseases: Secondary | ICD-10-CM | POA: Insufficient documentation

## 2019-02-03 DIAGNOSIS — Z01812 Encounter for preprocedural laboratory examination: Secondary | ICD-10-CM | POA: Diagnosis not present

## 2019-02-03 LAB — SARS CORONAVIRUS 2 (TAT 6-24 HRS): SARS Coronavirus 2: NEGATIVE

## 2019-02-04 ENCOUNTER — Telehealth: Payer: Self-pay

## 2019-02-04 NOTE — Telephone Encounter (Signed)
Spoke with pt and informed him that we now have the Hep A/B vaccine available at our office. Pt plans to come in for his first dose on 02-10-19.

## 2019-02-05 ENCOUNTER — Encounter: Payer: Self-pay | Admitting: *Deleted

## 2019-02-06 ENCOUNTER — Other Ambulatory Visit: Payer: Self-pay

## 2019-02-06 ENCOUNTER — Encounter
Admission: RE | Disposition: A | Discharge status: Home / Self Care | Payer: Self-pay | Source: Home / Self Care | Attending: Gastroenterology

## 2019-02-06 ENCOUNTER — Ambulatory Visit: Payer: BC Managed Care – PPO | Admitting: Anesthesiology

## 2019-02-06 ENCOUNTER — Encounter: Payer: Self-pay | Admitting: *Deleted

## 2019-02-06 ENCOUNTER — Ambulatory Visit
Admission: RE | Admit: 2019-02-06 | Discharge: 2019-02-06 | Disposition: A | Discharge status: Home / Self Care | Payer: BC Managed Care – PPO | Attending: Gastroenterology | Admitting: Gastroenterology

## 2019-02-06 DIAGNOSIS — Z7984 Long term (current) use of oral hypoglycemic drugs: Secondary | ICD-10-CM | POA: Diagnosis not present

## 2019-02-06 DIAGNOSIS — K766 Portal hypertension: Secondary | ICD-10-CM | POA: Diagnosis not present

## 2019-02-06 DIAGNOSIS — Z905 Acquired absence of kidney: Secondary | ICD-10-CM | POA: Diagnosis not present

## 2019-02-06 DIAGNOSIS — I10 Essential (primary) hypertension: Secondary | ICD-10-CM | POA: Insufficient documentation

## 2019-02-06 DIAGNOSIS — Z809 Family history of malignant neoplasm, unspecified: Secondary | ICD-10-CM | POA: Diagnosis not present

## 2019-02-06 DIAGNOSIS — Z885 Allergy status to narcotic agent status: Secondary | ICD-10-CM | POA: Insufficient documentation

## 2019-02-06 DIAGNOSIS — E785 Hyperlipidemia, unspecified: Secondary | ICD-10-CM | POA: Insufficient documentation

## 2019-02-06 DIAGNOSIS — Z87891 Personal history of nicotine dependence: Secondary | ICD-10-CM | POA: Insufficient documentation

## 2019-02-06 DIAGNOSIS — D696 Thrombocytopenia, unspecified: Secondary | ICD-10-CM | POA: Insufficient documentation

## 2019-02-06 DIAGNOSIS — K760 Fatty (change of) liver, not elsewhere classified: Secondary | ICD-10-CM | POA: Diagnosis not present

## 2019-02-06 DIAGNOSIS — Z981 Arthrodesis status: Secondary | ICD-10-CM | POA: Insufficient documentation

## 2019-02-06 DIAGNOSIS — I8510 Secondary esophageal varices without bleeding: Secondary | ICD-10-CM | POA: Insufficient documentation

## 2019-02-06 DIAGNOSIS — Z833 Family history of diabetes mellitus: Secondary | ICD-10-CM | POA: Insufficient documentation

## 2019-02-06 DIAGNOSIS — K7469 Other cirrhosis of liver: Secondary | ICD-10-CM | POA: Diagnosis not present

## 2019-02-06 DIAGNOSIS — E119 Type 2 diabetes mellitus without complications: Secondary | ICD-10-CM | POA: Diagnosis not present

## 2019-02-06 DIAGNOSIS — Z808 Family history of malignant neoplasm of other organs or systems: Secondary | ICD-10-CM | POA: Insufficient documentation

## 2019-02-06 DIAGNOSIS — K7460 Unspecified cirrhosis of liver: Secondary | ICD-10-CM | POA: Insufficient documentation

## 2019-02-06 DIAGNOSIS — Z820 Family history of epilepsy and other diseases of the nervous system: Secondary | ICD-10-CM | POA: Insufficient documentation

## 2019-02-06 DIAGNOSIS — Z79899 Other long term (current) drug therapy: Secondary | ICD-10-CM | POA: Insufficient documentation

## 2019-02-06 DIAGNOSIS — Z888 Allergy status to other drugs, medicaments and biological substances status: Secondary | ICD-10-CM | POA: Insufficient documentation

## 2019-02-06 DIAGNOSIS — K3189 Other diseases of stomach and duodenum: Secondary | ICD-10-CM | POA: Insufficient documentation

## 2019-02-06 HISTORY — PX: ESOPHAGOGASTRODUODENOSCOPY (EGD) WITH PROPOFOL: SHX5813

## 2019-02-06 LAB — GLUCOSE, CAPILLARY: Glucose-Capillary: 107 mg/dL — ABNORMAL HIGH (ref 70–99)

## 2019-02-06 SURGERY — ESOPHAGOGASTRODUODENOSCOPY (EGD) WITH PROPOFOL
Anesthesia: General

## 2019-02-06 MED ORDER — PROPOFOL 500 MG/50ML IV EMUL
INTRAVENOUS | Status: AC
  Filled 2019-02-06: qty 50

## 2019-02-06 MED ORDER — SODIUM CHLORIDE 0.9 % IV SOLN
INTRAVENOUS | Status: DC
  Administered 2019-02-06: 07:00:00 via INTRAVENOUS

## 2019-02-06 MED ORDER — PROPOFOL 10 MG/ML IV BOLUS
INTRAVENOUS | Status: DC | PRN
  Administered 2019-02-06 (×3): 50 mg via INTRAVENOUS
  Administered 2019-02-06: 20 mg via INTRAVENOUS

## 2019-02-06 NOTE — Op Note (Signed)
[facility] Patient Name: Henry Moore Procedure Date: 02/06/2019 7:48 AM MRN: [id number] Account #: [id number] Date of Birth: 09/24/1960 Admit Type: Outpatient Age: 58 Room: [facility] ENDO ROOM 4 Gender: Male Note Status: Finalized Procedure:            Upper GI endoscopy Indications:          Cirrhosis rule out esophageal varices Providers:            [name] MD, MD Referring MD:         Guadalupe Maple, MD (Referring MD) Medicines:            Monitored Anesthesia Care Complications:        No immediate complications. Procedure:            Pre-Anesthesia Assessment:                       - Prior to the procedure, a History and Physical was                        performed, and patient medications, allergies and                        sensitivities were reviewed. The patient's tolerance of                        previous anesthesia was reviewed.                       - The risks and benefits of the procedure and the                        sedation options and risks were discussed with the                        patient. All questions were answered and informed                        consent was obtained.                       - ASA Grade Assessment: III - A patient with severe                        systemic disease.                       After obtaining informed consent, the endoscope was                        passed under direct vision. Throughout the procedure,                        the patient's blood pressure, pulse, and oxygen                        saturations were monitored continuously. The Endoscope                        was introduced through the mouth, and advanced to the  third part of duodenum. The upper GI endoscopy was                        accomplished with ease. The patient tolerated the                        procedure well. Findings:      The examined duodenum was normal.      Moderate portal  hypertensive gastropathy was found in the entire       examined stomach.      Grade III varices were found in the lower third of the esophagus. They       were large in size. Three bands were successfully placed with incomplete       eradication of varices. There was no bleeding during and at the end of       the procedure.      The exam was otherwise without abnormality. Impression:           - Normal examined duodenum.                       - Portal hypertensive gastropathy.                       - Grade III esophageal varices. Incompletely                        eradicated. Banded.                       - The examination was otherwise normal.                       - No specimens collected. Recommendation:       - Discharge patient to home (with escort).                       - Resume previous diet.                       - Continue present medications.                       - Repeat upper endoscopy in 3 weeks for endoscopic band                        ligation.                       - Return to GI office in 6 weeks. Procedure Code(s):    --- Professional ---                       [phone], Esophagogastroduodenoscopy, flexible, transoral;                        with band ligation of esophageal/gastric varices Diagnosis Code(s):    --- Professional ---                       K74.60, Unspecified cirrhosis of liver                       I85.10, Secondary esophageal varices without bleeding  K76.6, Portal hypertension                       K31.89, Other diseases of stomach and duodenum CPT copyright 2019 American Medical Association. All rights reserved. The codes documented in this report are preliminary and upon coder review may  be revised to meet current compliance requirements. [name], MD [name] MD, MD 02/06/2019 8:06:24 AM This report has been signed electronically. Number of Addenda: 0 Note Initiated On: 02/06/2019 7:48 AM Total Procedure Duration: 0 hours  7 minutes 38 seconds  Estimated Blood Loss: Estimated blood loss: none.      [facility]

## 2019-02-06 NOTE — Anesthesia Postprocedure Evaluation (Signed)
Anesthesia Post Note  Patient: Henry Moore  Procedure(s) Performed: ESOPHAGOGASTRODUODENOSCOPY (EGD) WITH PROPOFOL (N/A )  Patient location during evaluation: Endoscopy Anesthesia Type: General Level of consciousness: awake and alert Pain management: pain level controlled Vital Signs Assessment: post-procedure vital signs reviewed and stable Respiratory status: spontaneous breathing, nonlabored ventilation, respiratory function stable and patient connected to nasal cannula oxygen Cardiovascular status: blood pressure returned to baseline and stable Postop Assessment: no apparent nausea or vomiting Anesthetic complications: no     Last Vitals:  Vitals:   02/06/19 0817 02/06/19 0827  BP: 139/77 (!) 144/74  Pulse: 86 80  Resp: 13 17  Temp:    SpO2: 100% 99%    Last Pain:  Vitals:   02/06/19 0827  TempSrc:   PainSc: 0-No pain                 Martha Clan

## 2019-02-06 NOTE — H&P (Signed)
Henry Bellows, MD [address] [place], [place], [place], [address] [address], [place], [place], [place], [address] Phone: [phone]  Fax: [phone]  Primary Care Physician:  Henry Maple, MD   Pre-Procedure History & Physical: HPI:  Henry Moore is a 58 y.o. male is here for an endoscopy    Past Medical History:  Diagnosis Date  . Cough    SINUS DRAINAGE CAUSING COUGHING , REPORTS CLEAR MUCOUS   . Diabetes mellitus without complication ([place])   . HTN (hypertension)   . Hyperlipemia   . Left renal mass   . Platelets decreased ([place])    DENIES UNSUAL BLEEDING   . PONV (postoperative nausea and vomiting)   . WBC decreased     Past Surgical History:  Procedure Laterality Date  . ANKLE SURGERY Left   . ANTERIOR CERVICAL DECOMP/DISCECTOMY FUSION N/A 04/16/2014   Procedure: ANTERIOR CERVICAL DECOMPRESSION/DISCECTOMY FUSION  (ACDF C5-C7)   (2 LEVELS)     ;  Surgeon: Henry Schools, MD;  Location: [facility];  Service: Orthopedics;  Laterality: N/A;  . APPENDECTOMY    . HYDROCELE EXCISION    . KNEE ARTHROSCOPY Bilateral   . LAPAROSCOPIC NEPHRECTOMY, HAND ASSISTED Left 10/16/2018   Procedure: LEFT HAND ASSISTED LAPAROSCOPIC NEPHRECTOMY;  Surgeon: Henry Mallow, MD;  Location: [facility];  Service: Urology;  Laterality: Left;  . NASAL SINUS SURGERY     X 2  . 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 SHOULDER SURGERY Left     Prior to Admission medications   Medication Sig Start Date End Date Taking? Authorizing Provider  ACCU-CHEK AVIVA PLUS test strip CHECK BL00D SUGAR ONCE OR TWICE DAILY. Patient taking differently: 1 each by Other route as needed.  12/14/16  Yes Moore, Henry P, DO  amLODipine (NORVASC) 10 MG tablet Take 1 tablet (10 mg total) by mouth daily. 06/18/18  Yes Henry Maple, MD  carvedilol (COREG) 25 MG tablet Take 1 tablet (25 mg total) by mouth 2 (two) times daily with a meal. 06/18/18  Yes Moore, Henry How, MD  glipiZIDE (GLUCOTROL) 5  MG tablet Take 1 tablet (5 mg total) by mouth daily before breakfast. 06/18/18  Yes Moore, Henry How, MD  hydrochlorothiazide (HYDRODIURIL) 25 MG tablet TAKE 1 TABLET DAILY. 07/22/18  Yes Henry American, PA-C  losartan (COZAAR) 100 MG tablet Take 1 tablet by mouth daily. 07/22/18  Yes Henry American, PA-C  lovastatin (MEVACOR) 40 MG tablet Take 1 tablet (40 mg total) by mouth at bedtime. 06/18/18  Yes Henry Maple, MD  metFORMIN (GLUMETZA) 500 MG (MOD) 24 hr tablet Take 1 tablet (500 mg total) by mouth daily with breakfast. 06/18/18  Yes Moore, Henry How, MD  sulfamethoxazole-trimethoprim (BACTRIM DS) 800-160 MG tablet Take 1 tablet by mouth 2 (two) times daily. Patient not taking: Reported on 01/16/2019 01/03/19   Henry Liter P, DO    Allergies as of 01/17/2019 - Review Complete 01/16/2019  Allergen Reaction Noted  . Codeine Itching 04/14/2014  . Mucinex [guaifenesin er] Anxiety 05/21/2017    Family History  Problem Relation Age of Onset  . Diabetes Mother   . Cancer Father        brain  . Cancer Maternal Grandmother   . Alzheimer's disease Paternal Grandmother     Social History   Socioeconomic History  . Marital status: Married    Spouse name: Not on file  . Number of children: Not on  file  . Years of education: Not on file  . Highest education level: Not on file  Occupational History  . Not on file  Social Needs  . Financial resource strain: Not on file  . Food insecurity    Worry: Not on file    Inability: Not on file  . Transportation needs    Medical: Not on file    Non-medical: Not on file  Tobacco Use  . Smoking status: Former Smoker    Packs/day: 0.25    Years: 20.00    Pack years: 5.00    Quit date: 07/19/2015    Years since quitting: 3.5  . Smokeless tobacco: Never Used  Substance and Sexual Activity  . Alcohol use: Not Currently    Alcohol/week: 0.0 standard drinks    Comment: weekends , '" I HAVENT HAD A BEER IN 2 MONTHS "   .  Drug use: No  . Sexual activity: Not on file  Lifestyle  . Physical activity    Days per week: Not on file    Minutes per session: Not on file  . Stress: Not on file  Relationships  . Social Herbalist on phone: Not on file    Gets together: Not on file    Attends religious service: Not on file    Active member of club or organization: Not on file    Attends meetings of clubs or organizations: Not on file    Relationship status: Not on file  . Intimate partner violence    Fear of current or ex partner: Not on file    Emotionally abused: Not on file    Physically abused: Not on file    Forced sexual activity: Not on file  Other Topics Concern  . Not on file  Social History Narrative  . Not on file    Review of Systems: See HPI, otherwise negative ROS  Physical Exam: BP (!) 151/71   Pulse 78   Temp (!) 96 F (35.6 C)   Resp 18   Ht 6\' 1"  (1.854 m)   Wt 111.1 kg   SpO2 100%   BMI 32.32 kg/m  General:   Alert,  pleasant and cooperative in NAD Head:  Normocephalic and atraumatic. Neck:  Supple; no masses or thyromegaly. Lungs:  Clear throughout to auscultation, normal respiratory effort.    Heart:  +S1, +S2, Regular rate and rhythm, No edema. Abdomen:  Soft, nontender and nondistended. Normal bowel sounds, without guarding, and without rebound.   Neurologic:  Alert and  oriented x4;  grossly normal neurologically.  Impression/Plan: Henry Moore is here for an endoscopy  to be performed for  evaluation of esophageal varices    Risks, benefits, limitations, and alternatives regarding endoscopy have been reviewed with the patient.  Questions have been answered.  All parties agreeable.   Henry Bellows, MD  02/06/2019, 7:38 AM

## 2019-02-06 NOTE — Anesthesia Post-op Follow-up Note (Signed)
Anesthesia QCDR form completed.        

## 2019-02-06 NOTE — Transfer of Care (Signed)
Immediate Anesthesia Transfer of Care Note  Patient: Henry Moore  Procedure(s) Performed: ESOPHAGOGASTRODUODENOSCOPY (EGD) WITH PROPOFOL (N/A )  Patient Location: PACU and Endoscopy Unit  Anesthesia Type:General  Level of Consciousness: awake, alert  and oriented  Airway & Oxygen Therapy: Patient Spontanous Breathing  Post-op Assessment: Report given to RN and Post -op Vital signs reviewed and stable  Post vital signs: Reviewed and stable  Last Vitals:  Vitals Value Taken Time  BP 126/71 02/06/19 0807  Temp 36.4 C 02/06/19 0807  Pulse 79 02/06/19 0809  Resp 16 02/06/19 0809  SpO2 100 % 02/06/19 0809  Vitals shown include unvalidated device data.  Last Pain:  Vitals:   02/06/19 0807  TempSrc: Tympanic  PainSc: 0-No pain         Complications: No apparent anesthesia complications

## 2019-02-06 NOTE — Anesthesia Preprocedure Evaluation (Signed)
Anesthesia Evaluation  Patient identified by MRN, date of birth, ID band Patient awake    Reviewed: Allergy & Precautions, H&P , NPO status , Patient's Chart, lab work & pertinent test results, reviewed documented beta blocker date and time   History of Anesthesia Complications (+) PONV and history of anesthetic complications  Airway Mallampati: III  TM Distance: >3 FB Neck ROM: full    Dental  (+) Dental Advidsory Given, Caps, Teeth Intact   Pulmonary neg pulmonary ROS, former smoker,           Cardiovascular Exercise Tolerance: Good hypertension, (-) angina(-) Past MI (-) dysrhythmias (-) Valvular Problems/Murmurs     Neuro/Psych negative neurological ROS  negative psych ROS   GI/Hepatic negative GI ROS, NAFLD   Endo/Other  diabetes, Well Controlled, Type 2, Oral Hypoglycemic Agents  Renal/GU Renal disease (h/o renal mass s/p removal.)  negative genitourinary   Musculoskeletal   Abdominal   Peds  Hematology negative hematology ROS (+)   Anesthesia Other Findings Past Medical History: No date: Cough     Comment:  SINUS DRAINAGE CAUSING COUGHING , REPORTS CLEAR MUCOUS  No date: Diabetes mellitus without complication ([facility]) No date: HTN (hypertension) No date: Hyperlipemia No date: Left renal mass No date: Platelets decreased ([facility])     Comment:  DENIES UNSUAL BLEEDING  No date: PONV (postoperative nausea and vomiting) No date: WBC decreased   Reproductive/Obstetrics negative OB ROS                             Anesthesia Physical Anesthesia Plan  ASA: III  Anesthesia Plan: General   Post-op Pain Management:    Induction: Intravenous  PONV Risk Score and Plan: 3 and Propofol infusion and TIVA  Airway Management Planned: Natural Airway and Nasal Cannula  Additional Equipment:   Intra-op Plan:   Post-operative Plan:   Informed Consent: I have reviewed the patients History  and Physical, chart, labs and discussed the procedure including the risks, benefits and alternatives for the proposed anesthesia with the patient or authorized representative who has indicated his/her understanding and acceptance.     Dental Advisory Given  Plan Discussed with: Anesthesiologist, CRNA and Surgeon  Anesthesia Plan Comments:         Anesthesia Quick Evaluation

## 2019-02-10 ENCOUNTER — Ambulatory Visit (INDEPENDENT_AMBULATORY_CARE_PROVIDER_SITE_OTHER): Payer: BC Managed Care – PPO | Admitting: Gastroenterology

## 2019-02-10 ENCOUNTER — Encounter: Payer: Self-pay | Admitting: Urology

## 2019-02-10 ENCOUNTER — Other Ambulatory Visit: Payer: Self-pay

## 2019-02-10 DIAGNOSIS — Z23 Encounter for immunization: Secondary | ICD-10-CM | POA: Diagnosis not present

## 2019-02-10 DIAGNOSIS — K7469 Other cirrhosis of liver: Secondary | ICD-10-CM

## 2019-02-26 ENCOUNTER — Telehealth: Payer: Self-pay

## 2019-02-26 ENCOUNTER — Other Ambulatory Visit: Payer: Self-pay

## 2019-02-26 DIAGNOSIS — I8510 Secondary esophageal varices without bleeding: Secondary | ICD-10-CM

## 2019-02-26 DIAGNOSIS — K7469 Other cirrhosis of liver: Secondary | ICD-10-CM

## 2019-02-26 NOTE — Telephone Encounter (Signed)
-----   Message from Rushie Chestnut, [place] sent at 01/16/2019  4:21 PM EDT ----- Regarding: Schedule U/S appt Call pt to schedule Liver U/S (ordered) due this month or next

## 2019-02-26 NOTE — Telephone Encounter (Signed)
Called pt to inquire about the best dates to schedule the ultrasound and also to schedule pt repeat upper endoscopy. Pt procedure has been scheduled.

## 2019-02-28 ENCOUNTER — Other Ambulatory Visit
Admission: RE | Admit: 2019-02-28 | Discharge: 2019-02-28 | Disposition: A | Discharge status: Home / Self Care | Payer: BC Managed Care – PPO | Source: Ambulatory Visit | Attending: Gastroenterology | Admitting: Gastroenterology

## 2019-02-28 ENCOUNTER — Other Ambulatory Visit: Payer: Self-pay

## 2019-02-28 DIAGNOSIS — Z20828 Contact with and (suspected) exposure to other viral communicable diseases: Secondary | ICD-10-CM | POA: Insufficient documentation

## 2019-02-28 DIAGNOSIS — Z01812 Encounter for preprocedural laboratory examination: Secondary | ICD-10-CM | POA: Insufficient documentation

## 2019-02-28 LAB — SARS CORONAVIRUS 2 (TAT 6-24 HRS): SARS Coronavirus 2: NEGATIVE

## 2019-03-04 ENCOUNTER — Encounter
Admission: RE | Disposition: A | Discharge status: Home / Self Care | Payer: Self-pay | Source: Ambulatory Visit | Attending: Gastroenterology

## 2019-03-04 ENCOUNTER — Ambulatory Visit: Payer: BC Managed Care – PPO | Admitting: Anesthesiology

## 2019-03-04 ENCOUNTER — Ambulatory Visit
Admission: RE | Admit: 2019-03-04 | Discharge: 2019-03-04 | Disposition: A | Discharge status: Home / Self Care | Payer: BC Managed Care – PPO | Source: Ambulatory Visit | Attending: Gastroenterology | Admitting: Gastroenterology

## 2019-03-04 ENCOUNTER — Other Ambulatory Visit: Payer: Self-pay

## 2019-03-04 ENCOUNTER — Encounter: Payer: Self-pay | Admitting: *Deleted

## 2019-03-04 DIAGNOSIS — Z905 Acquired absence of kidney: Secondary | ICD-10-CM | POA: Diagnosis not present

## 2019-03-04 DIAGNOSIS — I10 Essential (primary) hypertension: Secondary | ICD-10-CM | POA: Insufficient documentation

## 2019-03-04 DIAGNOSIS — E785 Hyperlipidemia, unspecified: Secondary | ICD-10-CM | POA: Insufficient documentation

## 2019-03-04 DIAGNOSIS — E119 Type 2 diabetes mellitus without complications: Secondary | ICD-10-CM | POA: Diagnosis not present

## 2019-03-04 DIAGNOSIS — K7460 Unspecified cirrhosis of liver: Secondary | ICD-10-CM | POA: Diagnosis not present

## 2019-03-04 DIAGNOSIS — Z87891 Personal history of nicotine dependence: Secondary | ICD-10-CM | POA: Diagnosis not present

## 2019-03-04 DIAGNOSIS — K7469 Other cirrhosis of liver: Secondary | ICD-10-CM | POA: Diagnosis not present

## 2019-03-04 DIAGNOSIS — Z79899 Other long term (current) drug therapy: Secondary | ICD-10-CM | POA: Insufficient documentation

## 2019-03-04 DIAGNOSIS — I8510 Secondary esophageal varices without bleeding: Secondary | ICD-10-CM | POA: Diagnosis not present

## 2019-03-04 DIAGNOSIS — Z7984 Long term (current) use of oral hypoglycemic drugs: Secondary | ICD-10-CM | POA: Diagnosis not present

## 2019-03-04 DIAGNOSIS — K766 Portal hypertension: Secondary | ICD-10-CM | POA: Insufficient documentation

## 2019-03-04 DIAGNOSIS — K3189 Other diseases of stomach and duodenum: Secondary | ICD-10-CM | POA: Diagnosis not present

## 2019-03-04 DIAGNOSIS — R188 Other ascites: Secondary | ICD-10-CM

## 2019-03-04 HISTORY — PX: ESOPHAGOGASTRODUODENOSCOPY (EGD) WITH PROPOFOL: SHX5813

## 2019-03-04 LAB — GLUCOSE, CAPILLARY: Glucose-Capillary: 175 mg/dL — ABNORMAL HIGH (ref 70–99)

## 2019-03-04 SURGERY — ESOPHAGOGASTRODUODENOSCOPY (EGD) WITH PROPOFOL
Anesthesia: General

## 2019-03-04 MED ORDER — PROPOFOL 500 MG/50ML IV EMUL
INTRAVENOUS | Status: AC
  Filled 2019-03-04: qty 500

## 2019-03-04 MED ORDER — PROPOFOL 10 MG/ML IV BOLUS
INTRAVENOUS | Status: AC
  Filled 2019-03-04: qty 80

## 2019-03-04 MED ORDER — LIDOCAINE HCL (CARDIAC) PF 100 MG/5ML IV SOSY
PREFILLED_SYRINGE | INTRAVENOUS | Status: DC | PRN
  Administered 2019-03-04: 100 mg via INTRATRACHEAL

## 2019-03-04 MED ORDER — PANTOPRAZOLE SODIUM 40 MG PO TBEC: 40.0000 mg | DELAYED_RELEASE_TABLET | Freq: Two times a day (BID) | ORAL | 1 refills | Status: DC

## 2019-03-04 MED ORDER — PROPOFOL 10 MG/ML IV BOLUS
INTRAVENOUS | Status: DC | PRN
  Administered 2019-03-04: 50 mg via INTRAVENOUS
  Administered 2019-03-04: 30 mg via INTRAVENOUS
  Administered 2019-03-04 (×2): 50 mg via INTRAVENOUS
  Administered 2019-03-04 (×3): 30 mg via INTRAVENOUS

## 2019-03-04 MED ORDER — SODIUM CHLORIDE 0.9 % IV SOLN
INTRAVENOUS | Status: DC
  Administered 2019-03-04: 1000 mL via INTRAVENOUS

## 2019-03-04 MED ORDER — GLYCOPYRROLATE 0.2 MG/ML IJ SOLN
INTRAMUSCULAR | Status: DC | PRN
  Administered 2019-03-04: 0.2 mg via INTRAVENOUS

## 2019-03-04 NOTE — Op Note (Signed)
[facility] Gastroenterology Patient Name: Henry Moore Procedure Date: 03/04/2019 7:29 AM MRN: [id number] Account #: [id number] Date of Birth: 1961-06-30 Admit Type: Outpatient Age: 58 Room: [facility] ENDO ROOM 1 Gender: Male Note Status: Finalized Procedure:            Upper GI endoscopy Indications:          Esophageal varices, Follow-up of esophageal varices,                        For therapy of esophageal varices Providers:            Lin Landsman MD, MD Medicines:            Monitored Anesthesia Care Complications:        No immediate complications. Estimated blood loss: None. Procedure:            Pre-Anesthesia Assessment:                       - Prior to the procedure, a History and Physical was                        performed, and patient medications and allergies were                        reviewed. The patient is competent. The risks and                        benefits of the procedure and the sedation options and                        risks were discussed with the patient. All questions                        were answered and informed consent was obtained.                        Patient identification and proposed procedure were                        verified by the physician, the nurse, the                        anesthesiologist, the anesthetist and the technician in                        the pre-procedure area in the procedure room in the                        endoscopy suite. Mental Status Examination: alert and                        oriented. Airway Examination: normal oropharyngeal                        airway and neck mobility. Respiratory Examination:                        clear to auscultation. CV Examination: normal.  Prophylactic Antibiotics: The patient does not require                        prophylactic antibiotics. Prior Anticoagulants: The                        patient has taken no previous anticoagulant  or                        antiplatelet agents. ASA Grade Assessment: III - A                        patient with severe systemic disease. After reviewing                        the risks and benefits, the patient was deemed in                        satisfactory condition to undergo the procedure. The                        anesthesia plan was to use monitored anesthesia care                        (MAC). Immediately prior to administration of                        medications, the patient was re-assessed for adequacy                        to receive sedatives. The heart rate, respiratory rate,                        oxygen saturations, blood pressure, adequacy of                        pulmonary ventilation, and response to care were                        monitored throughout the procedure. The physical status                        of the patient was re-assessed after the procedure.                       After obtaining informed consent, the endoscope was                        passed under direct vision. Throughout the procedure,                        the patient's blood pressure, pulse, and oxygen                        saturations were monitored continuously. The Endoscope                        was introduced through the mouth, and advanced to the  second part of duodenum. The upper GI endoscopy was                        accomplished without difficulty. The patient tolerated                        the procedure well. Findings:      The duodenal bulb and second portion of the duodenum were normal.      Multiple dispersed, diminutive non-bleeding erosions were found in the       entire examined stomach. There were stigmata of recent bleeding.      Severe portal hypertensive gastropathy was found in the entire examined       stomach.      Large (> 5 mm) varices were found in the lower third of the esophagus.       Four bands were successfully placed with  incomplete eradication of       varices. There was no bleeding during and at the end of the procedure.      The gastroesophageal junction was normal. Impression:           - Normal duodenal bulb and second portion of the                        duodenum.                       - Non-bleeding erosive gastropathy.                       - Portal hypertensive gastropathy.                       - Large (> 5 mm) esophageal varices. Incompletely                        eradicated. Banded.                       - Normal gastroesophageal junction.                       - No specimens collected. Recommendation:       - Discharge patient to home (with escort).                       - Low sodium diet today.                       - Continue present medications.                       - Protonix 19m BID                       - Repeat upper endoscopy in 6 weeks for retreatment,                        for surveillance and for endoscopic band ligation. Procedure Code(s):    --- Professional ---                       [id number] Esophagogastroduodenoscopy, flexible, transoral;  with band ligation of esophageal/gastric varices Diagnosis Code(s):    --- Professional ---                       K31.89, Other diseases of stomach and duodenum                       K76.6, Portal hypertension                       I85.00, Esophageal varices without bleeding CPT copyright 2019 American Medical Association. All rights reserved. The codes documented in this report are preliminary and upon coder review may  be revised to meet current compliance requirements. Dr. Ulyess Mort Lin Landsman MD, MD 03/04/2019 8:45:44 AM This report has been signed electronically. Number of Addenda: 0 Note Initiated On: 03/04/2019 7:29 AM Estimated Blood Loss: Estimated blood loss: none.      [facility]

## 2019-03-04 NOTE — Anesthesia Post-op Follow-up Note (Signed)
Anesthesia QCDR form completed.        

## 2019-03-04 NOTE — H&P (Signed)
Cephas Darby, MD [address]  [place]  [place], [place] [address]  Main: [phone]  Fax: [phone] Pager: [phone]  Primary Care Physician:  Guadalupe Maple, MD Primary Gastroenterologist:  Dr. Cephas Darby  Pre-Procedure History & Physical: HPI:  Jabe Jeanbaptiste Saidi is a 58 y.o. male is here for an endoscopy.   Past Medical History:  Diagnosis Date   Cough    SINUS DRAINAGE CAUSING COUGHING , REPORTS CLEAR MUCOUS    Diabetes mellitus without complication (HCC)    HTN (hypertension)    Hyperlipemia    Left renal mass    Platelets decreased (HCC)    DENIES UNSUAL BLEEDING    PONV (postoperative nausea and vomiting)    WBC decreased     Past Surgical History:  Procedure Laterality Date   ANKLE SURGERY Left    ANTERIOR CERVICAL DECOMP/DISCECTOMY FUSION N/A 04/16/2014   Procedure: ANTERIOR CERVICAL DECOMPRESSION/DISCECTOMY FUSION  (ACDF C5-C7)   (2 LEVELS)     ;  Surgeon: Melina Schools, MD;  Location: [facility];  Service: Orthopedics;  Laterality: N/A;   APPENDECTOMY     ESOPHAGOGASTRODUODENOSCOPY (EGD) WITH PROPOFOL N/A 02/06/2019   Procedure: ESOPHAGOGASTRODUODENOSCOPY (EGD) WITH PROPOFOL;  Surgeon: [name], MD;  Location: 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 Authorizing Provider  amLODipine (NORVASC) 10 MG tablet Take 1 tablet (10 mg total) by mouth daily. 06/18/18  Yes Guadalupe Maple, MD  carvedilol (COREG) 25 MG tablet Take 1 tablet (25 mg total) by mouth 2 (two) times daily  with a meal. 06/18/18  Yes Crissman, Jeannette How, MD  glipiZIDE (GLUCOTROL) 5 MG tablet Take 1 tablet (5 mg total) by mouth daily before breakfast. 06/18/18  Yes Crissman, Jeannette How, MD  hydrochlorothiazide (HYDRODIURIL) 25 MG tablet TAKE 1 TABLET DAILY. 07/22/18  Yes Volney American, PA-C  losartan (COZAAR) 100 MG tablet Take 1 tablet by mouth daily. 07/22/18  Yes Volney American, PA-C  lovastatin (MEVACOR) 40 MG tablet Take 1 tablet (40 mg total) by mouth at bedtime. 06/18/18  Yes Guadalupe Maple, MD  metFORMIN (GLUMETZA) 500 MG (MOD) 24 hr tablet Take 1 tablet (500 mg total) by mouth daily with breakfast. 06/18/18  Yes Crissman, Jeannette How, MD  ACCU-CHEK AVIVA PLUS test strip CHECK BL00D SUGAR ONCE OR TWICE DAILY. Patient taking differently: 1 each by Other route as needed.  12/14/16   Johnson, Megan P, DO  sulfamethoxazole-trimethoprim (BACTRIM DS) 800-160 MG tablet Take 1 tablet by mouth 2 (two) times daily. Patient not taking: Reported on 01/16/2019 01/03/19   Park Liter P, DO    Allergies as of 02/26/2019 - Review Complete 02/06/2019  Allergen Reaction Noted   Codeine Itching 04/14/2014   Mucinex [guaifenesin er] Anxiety 05/21/2017    Family History  Problem Relation Age of Onset   Diabetes Mother    Cancer Father        brain   Cancer Maternal Grandmother    Alzheimer's disease Paternal Grandmother  Social History   Socioeconomic History   Marital status: Married    Spouse name: Not on file   Number of children: Not on file   Years of education: Not on file   Highest education level: Not on file  Occupational History   Not on file  Social Needs   Financial resource strain: Not on file   Food insecurity    Worry: Not on file    Inability: Not on file   Transportation needs    Medical: Not on file    Non-medical: Not on file  Tobacco Use   Smoking status: Former Smoker    Packs/day: 0.25    Years: 20.00    Pack years: 5.00    Quit date:  07/19/2015    Years since quitting: 3.6   Smokeless tobacco: Never Used  Substance and Sexual Activity   Alcohol use: Not Currently    Alcohol/week: 0.0 standard drinks    Comment: weekends , '" I HAVENT HAD A BEER IN 2 MONTHS "    Drug use: No   Sexual activity: Not on file  Lifestyle   Physical activity    Days per week: Not on file    Minutes per session: Not on file   Stress: Not on file  Relationships   Social connections    Talks on phone: Not on file    Gets together: Not on file    Attends religious service: Not on file    Active member of club or organization: Not on file    Attends meetings of clubs or organizations: Not on file    Relationship status: Not on file   Intimate partner violence    Fear of current or ex partner: Not on file    Emotionally abused: Not on file    Physically abused: Not on file    Forced sexual activity: Not on file  Other Topics Concern   Not on file  Social History Narrative   Not on file    Review of Systems: See HPI, otherwise negative ROS  Physical Exam: BP (!) 148/80    Pulse 79    Temp (!) 97 F (36.1 C) (Tympanic)    Resp 16    Ht 6\' 1"  (1.854 m)    Wt 108.9 kg    SpO2 100%    BMI 31.66 kg/m  General:   Alert,  pleasant and cooperative in NAD Head:  Normocephalic and atraumatic. Neck:  Supple; no masses or thyromegaly. Lungs:  Clear throughout to auscultation.    Heart:  Regular rate and rhythm. Abdomen:  Soft, nontender and nondistended. Normal bowel sounds, without guarding, and without rebound.   Neurologic:  Alert and  oriented x4;  grossly normal neurologically.  Impression/Plan: Alvia Tory Mayo is here for an endoscopy to be performed for variceal surveillance and banding  Risks, benefits, limitations, and alternatives regarding  endoscopy have been reviewed with the patient.  Questions have been answered.  All parties agreeable.   Sherri Sear, MD  03/04/2019, 8:24 AM

## 2019-03-04 NOTE — Anesthesia Postprocedure Evaluation (Signed)
Anesthesia Post Note  Patient: Henry Moore  Procedure(s) Performed: ESOPHAGOGASTRODUODENOSCOPY (EGD) WITH PROPOFOL (N/A )  Patient location during evaluation: Endoscopy Anesthesia Type: General Level of consciousness: awake and alert and oriented Pain management: pain level controlled Vital Signs Assessment: post-procedure vital signs reviewed and stable Respiratory status: spontaneous breathing, nonlabored ventilation and respiratory function stable Cardiovascular status: blood pressure returned to baseline and stable Postop Assessment: no signs of nausea or vomiting Anesthetic complications: no     Last Vitals:  Vitals:   03/04/19 0856 03/04/19 0906  BP: 129/69 (!) 146/74  Pulse: 80 80  Resp: 17 15  Temp:    SpO2: 100% 100%    Last Pain:  Vitals:   03/04/19 0906  TempSrc:   PainSc: 0-No pain                 Kaisha Wachob

## 2019-03-04 NOTE — Transfer of Care (Signed)
Immediate Anesthesia Transfer of Care Note  Patient: Henry Moore  Procedure(s) Performed: ESOPHAGOGASTRODUODENOSCOPY (EGD) WITH PROPOFOL (N/A )  Patient Location: Endoscopy Unit  Anesthesia Type:General  Level of Consciousness: drowsy and patient cooperative  Airway & Oxygen Therapy: Patient Spontanous Breathing and Patient connected to face mask oxygen  Post-op Assessment: Report given to RN and Post -op Vital signs reviewed and stable  Post vital signs: Reviewed and stable  Last Vitals:  Vitals Value Taken Time  BP 124/81 03/04/19 0848  Temp    Pulse 85 03/04/19 0848  Resp 19 03/04/19 0848  SpO2 100 % 03/04/19 0848    Last Pain:  Vitals:   03/04/19 0703  TempSrc: Tympanic  PainSc: 0-No pain         Complications: No apparent anesthesia complications

## 2019-03-04 NOTE — Anesthesia Preprocedure Evaluation (Signed)
Anesthesia Evaluation  Patient identified by MRN, date of birth, ID band Patient awake    Reviewed: Allergy & Precautions, NPO status , Patient's Chart, lab work & pertinent test results  History of Anesthesia Complications (+) PONV and history of anesthetic complications  Airway Mallampati: III  TM Distance: >3 FB Neck ROM: Full    Dental  (+) Poor Dentition   Pulmonary neg sleep apnea, neg COPD, former smoker,    breath sounds clear to auscultation- rhonchi (-) wheezing      Cardiovascular hypertension, Pt. on medications (-) CAD, (-) Past MI, (-) Cardiac Stents and (-) CABG  Rhythm:Regular Rate:Normal - Systolic murmurs and - Diastolic murmurs    Neuro/Psych neg Seizures negative neurological ROS  negative psych ROS   GI/Hepatic negative GI ROS, (+) Cirrhosis   Esophageal Varices    ,   Endo/Other  diabetes, Oral Hypoglycemic Agents  Renal/GU Renal disease: s/p nephrectomy.     Musculoskeletal  (+) Arthritis ,   Abdominal (+) + obese,   Peds  Hematology negative hematology ROS (+)   Anesthesia Other Findings Past Medical History: No date: Cough     Comment:  SINUS DRAINAGE CAUSING COUGHING , REPORTS CLEAR MUCOUS  No date: Diabetes mellitus without complication (HCC) No date: HTN (hypertension) No date: Hyperlipemia No date: Left renal mass No date: Platelets decreased (HCC)     Comment:  DENIES UNSUAL BLEEDING  No date: PONV (postoperative nausea and vomiting) No date: WBC decreased   Reproductive/Obstetrics                             Anesthesia Physical Anesthesia Plan  ASA: III  Anesthesia Plan: General   Post-op Pain Management:    Induction: Intravenous  PONV Risk Score and Plan: 2 and Propofol infusion  Airway Management Planned: Natural Airway  Additional Equipment:   Intra-op Plan:   Post-operative Plan:   Informed Consent: I have reviewed the patients  History and Physical, chart, labs and discussed the procedure including the risks, benefits and alternatives for the proposed anesthesia with the patient or authorized representative who has indicated his/her understanding and acceptance.     Dental advisory given  Plan Discussed with: CRNA and Anesthesiologist  Anesthesia Plan Comments:         Anesthesia Quick Evaluation

## 2019-03-05 ENCOUNTER — Encounter: Payer: Self-pay | Admitting: Gastroenterology

## 2019-03-07 ENCOUNTER — Other Ambulatory Visit: Payer: Self-pay

## 2019-03-07 ENCOUNTER — Telehealth: Payer: Self-pay

## 2019-03-07 DIAGNOSIS — K7469 Other cirrhosis of liver: Secondary | ICD-10-CM

## 2019-03-07 DIAGNOSIS — I8510 Secondary esophageal varices without bleeding: Secondary | ICD-10-CM

## 2019-03-07 NOTE — Telephone Encounter (Signed)
Spoke with pt and was able to schedule repeat upper endoscopy procedure.

## 2019-03-07 NOTE — Telephone Encounter (Signed)
-----   Message from [name], MD sent at 03/06/2019 11:03 AM EDT ----- Regarding: FW: schedule f/u EGD Please arrange EGD in 6 weeks please   ----- Message ----- From: Lin Landsman, MD Sent: 03/04/2019   8:49 AM EDT To: [name], MD, Rushie Chestnut, CMA Subject: schedule f/u EGD                               Henry Moore  Please schedule egd in 6 weeks with Dr [name] for variceal surveillance and treatment  Thanks RV

## 2019-03-11 ENCOUNTER — Ambulatory Visit
Admission: RE | Admit: 2019-03-11 | Discharge: 2019-03-11 | Disposition: A | Discharge status: Home / Self Care | Payer: BC Managed Care – PPO | Source: Ambulatory Visit | Attending: Gastroenterology | Admitting: Gastroenterology

## 2019-03-11 ENCOUNTER — Other Ambulatory Visit: Payer: Self-pay

## 2019-03-11 DIAGNOSIS — K7469 Other cirrhosis of liver: Secondary | ICD-10-CM

## 2019-03-13 ENCOUNTER — Telehealth: Payer: Self-pay | Admitting: Gastroenterology

## 2019-03-13 ENCOUNTER — Encounter: Payer: Self-pay | Admitting: Gastroenterology

## 2019-03-13 NOTE — Telephone Encounter (Signed)
Usg showed liver cirrhosis and a few tiny gall bladder polyps which we will do nothing for. F/u in office after next procedure

## 2019-03-13 NOTE — Telephone Encounter (Signed)
Spoke with pt and informed him of U/S results and rescheduled his office visit to after his procedure.

## 2019-03-13 NOTE — Telephone Encounter (Signed)
Pt would like to know if his U/S results are back also do we need to keep his apt for 04/21/19 for a f/u his procedure his 04/22/19 do we need to schedule f/u after procedure?/

## 2019-03-14 ENCOUNTER — Other Ambulatory Visit: Payer: Self-pay

## 2019-03-14 ENCOUNTER — Ambulatory Visit (INDEPENDENT_AMBULATORY_CARE_PROVIDER_SITE_OTHER): Payer: BC Managed Care – PPO | Admitting: Gastroenterology

## 2019-03-14 DIAGNOSIS — K7469 Other cirrhosis of liver: Secondary | ICD-10-CM

## 2019-03-14 DIAGNOSIS — Z23 Encounter for immunization: Secondary | ICD-10-CM | POA: Diagnosis not present

## 2019-03-19 ENCOUNTER — Ambulatory Visit: Payer: BC Managed Care – PPO | Admitting: Family Medicine

## 2019-03-24 ENCOUNTER — Ambulatory Visit: Payer: BC Managed Care – PPO | Admitting: Family Medicine

## 2019-04-03 ENCOUNTER — Telehealth: Payer: Self-pay | Admitting: Family Medicine

## 2019-04-03 NOTE — Telephone Encounter (Signed)
Caller name: Ivin Booty  Relation to pt: from Dr. Benita Stabile (Internal Medicine)  Call back number: [phone] and fax # [phone]    Reason for call:  Medical release faxed on 03/21/2019 for medical records, sending form over again to [phone], requesting Last OV and labs. Patient currently in the office, please advise

## 2019-04-03 NOTE — Telephone Encounter (Signed)
Tried to contact office twice and no answer. Will try again tomorrow.

## 2019-04-04 NOTE — Telephone Encounter (Signed)
Records release is in office to given to [facility] and first 6 months of labs and notes sent.

## 2019-04-04 NOTE — Telephone Encounter (Signed)
Spoke with Ivin Booty and she stated that the pt will be transferring his care and would be appreciative if she could have at least the last 6 months of records. Advised her that I would take care of it and she will also be sending a request for the past 3 years of notes.

## 2019-04-18 ENCOUNTER — Other Ambulatory Visit
Admission: RE | Admit: 2019-04-18 | Discharge: 2019-04-18 | Disposition: A | Discharge status: Home / Self Care | Payer: BC Managed Care – PPO | Source: Ambulatory Visit | Attending: Gastroenterology | Admitting: Gastroenterology

## 2019-04-18 ENCOUNTER — Other Ambulatory Visit: Payer: Self-pay

## 2019-04-18 DIAGNOSIS — Z20828 Contact with and (suspected) exposure to other viral communicable diseases: Secondary | ICD-10-CM | POA: Insufficient documentation

## 2019-04-18 DIAGNOSIS — Z01812 Encounter for preprocedural laboratory examination: Secondary | ICD-10-CM | POA: Insufficient documentation

## 2019-04-18 LAB — SARS CORONAVIRUS 2 (TAT 6-24 HRS): SARS Coronavirus 2: NEGATIVE

## 2019-04-21 ENCOUNTER — Ambulatory Visit: Payer: BC Managed Care – PPO | Admitting: Gastroenterology

## 2019-04-22 ENCOUNTER — Ambulatory Visit: Payer: BC Managed Care – PPO | Admitting: Anesthesiology

## 2019-04-22 ENCOUNTER — Other Ambulatory Visit: Payer: Self-pay

## 2019-04-22 ENCOUNTER — Encounter: Payer: Self-pay | Admitting: *Deleted

## 2019-04-22 ENCOUNTER — Ambulatory Visit
Admission: RE | Admit: 2019-04-22 | Discharge: 2019-04-22 | Disposition: A | Discharge status: Home / Self Care | Payer: BC Managed Care – PPO | Attending: Gastroenterology | Admitting: Gastroenterology

## 2019-04-22 ENCOUNTER — Encounter
Admission: RE | Disposition: A | Discharge status: Home / Self Care | Payer: Self-pay | Source: Home / Self Care | Attending: Gastroenterology

## 2019-04-22 DIAGNOSIS — Z7984 Long term (current) use of oral hypoglycemic drugs: Secondary | ICD-10-CM | POA: Diagnosis not present

## 2019-04-22 DIAGNOSIS — K766 Portal hypertension: Secondary | ICD-10-CM | POA: Insufficient documentation

## 2019-04-22 DIAGNOSIS — E785 Hyperlipidemia, unspecified: Secondary | ICD-10-CM | POA: Diagnosis not present

## 2019-04-22 DIAGNOSIS — K7469 Other cirrhosis of liver: Secondary | ICD-10-CM | POA: Diagnosis not present

## 2019-04-22 DIAGNOSIS — M1990 Unspecified osteoarthritis, unspecified site: Secondary | ICD-10-CM | POA: Insufficient documentation

## 2019-04-22 DIAGNOSIS — E669 Obesity, unspecified: Secondary | ICD-10-CM | POA: Diagnosis not present

## 2019-04-22 DIAGNOSIS — Z981 Arthrodesis status: Secondary | ICD-10-CM | POA: Insufficient documentation

## 2019-04-22 DIAGNOSIS — I8500 Esophageal varices without bleeding: Secondary | ICD-10-CM | POA: Diagnosis present

## 2019-04-22 DIAGNOSIS — Z6832 Body mass index (BMI) 32.0-32.9, adult: Secondary | ICD-10-CM | POA: Diagnosis not present

## 2019-04-22 DIAGNOSIS — Z820 Family history of epilepsy and other diseases of the nervous system: Secondary | ICD-10-CM | POA: Insufficient documentation

## 2019-04-22 DIAGNOSIS — Z808 Family history of malignant neoplasm of other organs or systems: Secondary | ICD-10-CM | POA: Diagnosis not present

## 2019-04-22 DIAGNOSIS — K3189 Other diseases of stomach and duodenum: Secondary | ICD-10-CM | POA: Insufficient documentation

## 2019-04-22 DIAGNOSIS — Z885 Allergy status to narcotic agent status: Secondary | ICD-10-CM | POA: Diagnosis not present

## 2019-04-22 DIAGNOSIS — Z85528 Personal history of other malignant neoplasm of kidney: Secondary | ICD-10-CM | POA: Diagnosis not present

## 2019-04-22 DIAGNOSIS — Z833 Family history of diabetes mellitus: Secondary | ICD-10-CM | POA: Diagnosis not present

## 2019-04-22 DIAGNOSIS — I8510 Secondary esophageal varices without bleeding: Secondary | ICD-10-CM | POA: Diagnosis not present

## 2019-04-22 DIAGNOSIS — Z809 Family history of malignant neoplasm, unspecified: Secondary | ICD-10-CM | POA: Insufficient documentation

## 2019-04-22 DIAGNOSIS — Z888 Allergy status to other drugs, medicaments and biological substances status: Secondary | ICD-10-CM | POA: Diagnosis not present

## 2019-04-22 DIAGNOSIS — Z905 Acquired absence of kidney: Secondary | ICD-10-CM | POA: Diagnosis not present

## 2019-04-22 DIAGNOSIS — K7460 Unspecified cirrhosis of liver: Secondary | ICD-10-CM | POA: Diagnosis not present

## 2019-04-22 DIAGNOSIS — Z79899 Other long term (current) drug therapy: Secondary | ICD-10-CM | POA: Diagnosis not present

## 2019-04-22 DIAGNOSIS — E119 Type 2 diabetes mellitus without complications: Secondary | ICD-10-CM | POA: Diagnosis not present

## 2019-04-22 HISTORY — PX: ESOPHAGOGASTRODUODENOSCOPY (EGD) WITH PROPOFOL: SHX5813

## 2019-04-22 LAB — GLUCOSE, CAPILLARY: Glucose-Capillary: 142 mg/dL — ABNORMAL HIGH (ref 70–99)

## 2019-04-22 SURGERY — ESOPHAGOGASTRODUODENOSCOPY (EGD) WITH PROPOFOL
Anesthesia: General

## 2019-04-22 MED ORDER — EPHEDRINE SULFATE 50 MG/ML IJ SOLN
INTRAMUSCULAR | Status: AC
  Filled 2019-04-22: qty 1

## 2019-04-22 MED ORDER — SODIUM CHLORIDE 0.9 % IV SOLN
INTRAVENOUS | Status: DC
  Administered 2019-04-22: 1000 mL via INTRAVENOUS

## 2019-04-22 MED ORDER — LIDOCAINE HCL (PF) 2 % IJ SOLN
INTRAMUSCULAR | Status: AC
  Filled 2019-04-22: qty 10

## 2019-04-22 MED ORDER — PROPOFOL 500 MG/50ML IV EMUL
INTRAVENOUS | Status: AC
  Filled 2019-04-22: qty 50

## 2019-04-22 MED ORDER — PHENYLEPHRINE HCL (PRESSORS) 10 MG/ML IV SOLN
INTRAVENOUS | Status: AC
  Filled 2019-04-22: qty 1

## 2019-04-22 MED ORDER — PROPOFOL 500 MG/50ML IV EMUL
INTRAVENOUS | Status: DC | PRN
  Administered 2019-04-22: 175 ug/kg/min via INTRAVENOUS

## 2019-04-22 MED ORDER — LIDOCAINE HCL (CARDIAC) PF 100 MG/5ML IV SOSY
PREFILLED_SYRINGE | INTRAVENOUS | Status: DC | PRN
  Administered 2019-04-22: 50 mg via INTRAVENOUS

## 2019-04-22 MED ORDER — PROPOFOL 10 MG/ML IV BOLUS
INTRAVENOUS | Status: DC | PRN
  Administered 2019-04-22: 40 mg via INTRAVENOUS
  Administered 2019-04-22: 20 mg via INTRAVENOUS
  Administered 2019-04-22: 60 mg via INTRAVENOUS

## 2019-04-22 NOTE — Anesthesia Postprocedure Evaluation (Signed)
Anesthesia Post Note  Patient: Henry Moore  Procedure(s) Performed: ESOPHAGOGASTRODUODENOSCOPY (EGD) WITH PROPOFOL (N/A )  Patient location during evaluation: Endoscopy Anesthesia Type: General Level of consciousness: awake and alert and oriented Pain management: pain level controlled Vital Signs Assessment: post-procedure vital signs reviewed and stable Respiratory status: spontaneous breathing, nonlabored ventilation and respiratory function stable Cardiovascular status: blood pressure returned to baseline and stable Postop Assessment: no signs of nausea or vomiting Anesthetic complications: no     Last Vitals:  Vitals:   04/22/19 0834 04/22/19 0835  BP: 106/67 124/65  Pulse: 64 65  Resp: 13 14  Temp:    SpO2: 100% 100%    Last Pain:  Vitals:   04/22/19 0835  TempSrc:   PainSc: 0-No pain                 Daryl Beehler

## 2019-04-22 NOTE — Transfer of Care (Signed)
Immediate Anesthesia Transfer of Care Note  Patient: Henry Moore  Procedure(s) Performed: ESOPHAGOGASTRODUODENOSCOPY (EGD) WITH PROPOFOL (N/A )  Patient Location: PACU  Anesthesia Type:General  Level of Consciousness: sedated  Airway & Oxygen Therapy: Patient Spontanous Breathing and Patient connected to nasal cannula oxygen  Post-op Assessment: Report given to RN and Post -op Vital signs reviewed and stable  Post vital signs: Reviewed and stable  Last Vitals:  Vitals Value Taken Time  BP 85/50 04/22/19 0824  Temp 36.2 C 04/22/19 0824  Pulse 66 04/22/19 0825  Resp 17 04/22/19 0825  SpO2 98 % 04/22/19 0825  Vitals shown include unvalidated device data.  Last Pain:  Vitals:   04/22/19 0824  TempSrc: Tympanic  PainSc: 0-No pain         Complications: No apparent anesthesia complications

## 2019-04-22 NOTE — Anesthesia Post-op Follow-up Note (Signed)
Anesthesia QCDR form completed.        

## 2019-04-22 NOTE — H&P (Signed)
Henry Bellows, MD [address], [place], [place], [place], [address] [id number], [place], [place], [place], [address] Phone: [phone]  Fax: [phone]  Primary Care Physician:  Albina Billet, MD   Pre-Procedure History & Physical: HPI:  Henry Moore is a 58 y.o. male is here for an endoscopy    Past Medical History:  Diagnosis Date  . Cough    SINUS DRAINAGE CAUSING COUGHING , REPORTS CLEAR MUCOUS   . Diabetes mellitus without complication ([place])   . HTN (hypertension)   . Hyperlipemia   . Left renal mass   . Platelets decreased ([place])    DENIES UNSUAL BLEEDING   . PONV (postoperative nausea and vomiting)   . WBC decreased     Past Surgical History:  Procedure Laterality Date  . ANKLE SURGERY Left   . ANTERIOR CERVICAL DECOMP/DISCECTOMY FUSION N/A 04/16/2014   Procedure: ANTERIOR CERVICAL DECOMPRESSION/DISCECTOMY FUSION  (ACDF C5-C7)   (2 LEVELS)     ;  Surgeon: Melina Schools, MD;  Location: [place];  Service: Orthopedics;  Laterality: N/A;  . APPENDECTOMY    . BACK SURGERY    . ESOPHAGOGASTRODUODENOSCOPY (EGD) WITH PROPOFOL N/A 02/06/2019   Procedure: ESOPHAGOGASTRODUODENOSCOPY (EGD) WITH PROPOFOL;  Surgeon: Henry Bellows, MD;  Location: [facility] ENDOSCOPY;  Service: Gastroenterology;  Laterality: N/A;  . ESOPHAGOGASTRODUODENOSCOPY (EGD) WITH PROPOFOL N/A 03/04/2019   Procedure: ESOPHAGOGASTRODUODENOSCOPY (EGD) WITH PROPOFOL;  Surgeon: Lin Landsman, MD;  Location: [facility] ENDOSCOPY;  Service: Gastroenterology;  Laterality: N/A;  . FRACTURE SURGERY    . HYDROCELE EXCISION    . KNEE ARTHROSCOPY Bilateral   . LAPAROSCOPIC NEPHRECTOMY, HAND ASSISTED Left 10/16/2018   Procedure: LEFT HAND ASSISTED LAPAROSCOPIC NEPHRECTOMY;  Surgeon: Lucas Mallow, MD;  Location: [facility];  Service: Urology;  Laterality: Left;  . NASAL SINUS SURGERY     X 2  . 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 SHOULDER SURGERY Left     Prior to Admission medications   Medication Sig  Start Date End Date Taking? Authorizing Provider  ACCU-CHEK AVIVA PLUS test strip CHECK BL00D SUGAR ONCE OR TWICE DAILY. Patient taking differently: 1 each by Other route as needed.  12/14/16  Yes Johnson, Megan P, DO  amLODipine (NORVASC) 10 MG tablet Take 1 tablet (10 mg total) by mouth daily. 06/18/18  Yes Guadalupe Maple, MD  carvedilol (COREG) 25 MG tablet Take 1 tablet (25 mg total) by mouth 2 (two) times daily with a meal. 06/18/18  Yes Crissman, Jeannette How, MD  glipiZIDE (GLUCOTROL) 5 MG tablet Take 1 tablet (5 mg total) by mouth daily before breakfast. 06/18/18  Yes Crissman, Jeannette How, MD  hydrochlorothiazide (HYDRODIURIL) 25 MG tablet TAKE 1 TABLET DAILY. 07/22/18  Yes Volney American, PA-C  losartan (COZAAR) 100 MG tablet Take 1 tablet by mouth daily. 07/22/18  Yes Volney American, PA-C  lovastatin (MEVACOR) 40 MG tablet Take 1 tablet (40 mg total) by mouth at bedtime. 06/18/18  Yes Guadalupe Maple, MD  metFORMIN (GLUMETZA) 500 MG (MOD) 24 hr tablet Take 1 tablet (500 mg total) by mouth daily with breakfast. 06/18/18  Yes Crissman, Jeannette How, MD  pantoprazole (PROTONIX) 40 MG tablet Take 1 tablet (40 mg total) by mouth 2 (two) times daily before a meal. 03/04/19 06/02/19 Yes Vanga, Tally Due, MD    Allergies as of 03/07/2019 - Review Complete 03/04/2019  Allergen Reaction Noted  . Codeine Itching 04/14/2014  . Mucinex [guaifenesin er]  Anxiety 05/21/2017    Family History  Problem Relation Age of Onset  . Diabetes Mother   . Cancer Father        brain  . Cancer Maternal Grandmother   . Alzheimer's disease Paternal Grandmother     Social History   Socioeconomic History  . Marital status: Married    Spouse name: Not on file  . Number of children: Not on file  . Years of education: Not on file  . Highest education level: Not on file  Occupational History  . Not on file  Social Needs  . Financial resource strain: Not on file  . Food insecurity    Worry: Not on  file    Inability: Not on file  . Transportation needs    Medical: Not on file    Non-medical: Not on file  Tobacco Use  . Smoking status: Former Smoker    Packs/day: 0.25    Years: 20.00    Pack years: 5.00    Quit date: 07/19/2015    Years since quitting: 3.7  . Smokeless tobacco: Never Used  Substance and Sexual Activity  . Alcohol use: Not Currently    Alcohol/week: 0.0 standard drinks    Comment: no alchol in 1 year  . Drug use: No  . Sexual activity: Not on file  Lifestyle  . Physical activity    Days per week: Not on file    Minutes per session: Not on file  . Stress: Not on file  Relationships  . Social Herbalist on phone: Not on file    Gets together: Not on file    Attends religious service: Not on file    Active member of club or organization: Not on file    Attends meetings of clubs or organizations: Not on file    Relationship status: Not on file  . Intimate partner violence    Fear of current or ex partner: Not on file    Emotionally abused: Not on file    Physically abused: Not on file    Forced sexual activity: Not on file  Other Topics Concern  . Not on file  Social History Narrative  . Not on file    Review of Systems: See HPI, otherwise negative ROS  Physical Exam: BP (!) 147/76   Pulse 77   Temp (!) 96.9 F (36.1 C) (Tympanic)   Resp 20   Ht 6\' 1"  (1.854 m)   Wt 111.1 kg   SpO2 100%   BMI 32.32 kg/m  General:   Alert,  pleasant and cooperative in NAD Head:  Normocephalic and atraumatic. Neck:  Supple; no masses or thyromegaly. Lungs:  Clear throughout to auscultation, normal respiratory effort.    Heart:  +S1, +S2, Regular rate and rhythm, No edema. Abdomen:  Soft, nontender and nondistended. Normal bowel sounds, without guarding, and without rebound.   Neurologic:  Alert and  oriented x4;  grossly normal neurologically.  Impression/Plan: Henry Moore is here for an endoscopy  to be performed for  evaluation of  esophageal varices    Risks, benefits, limitations, and alternatives regarding endoscopy and banding have been reviewed with the patient.  Questions have been answered.  All parties agreeable.   Henry Bellows, MD  04/22/2019, 8:09 AM

## 2019-04-22 NOTE — Anesthesia Preprocedure Evaluation (Signed)
Anesthesia Evaluation  Patient identified by MRN, date of birth, ID band Patient awake    Reviewed: Allergy & Precautions, NPO status , Patient's Chart, lab work & pertinent test results  History of Anesthesia Complications (+) PONV and history of anesthetic complications  Airway Mallampati: II  TM Distance: >3 FB Neck ROM: Full    Dental  (+) Implants   Pulmonary neg sleep apnea, neg COPD, former smoker,    breath sounds clear to auscultation- rhonchi (-) wheezing      Cardiovascular hypertension, Pt. on medications (-) CAD, (-) Past MI, (-) Cardiac Stents and (-) CABG  Rhythm:Regular Rate:Normal - Systolic murmurs and - Diastolic murmurs    Neuro/Psych neg Seizures negative neurological ROS  negative psych ROS   GI/Hepatic negative GI ROS, (+) Cirrhosis       ,   Endo/Other  diabetes, Oral Hypoglycemic Agents  Renal/GU Renal disease: s/p nephrectomy for renal cancer.     Musculoskeletal  (+) Arthritis ,   Abdominal (+) + obese,   Peds  Hematology negative hematology ROS (+)   Anesthesia Other Findings Past Medical History: No date: Cough     Comment:  SINUS DRAINAGE CAUSING COUGHING , REPORTS CLEAR MUCOUS  No date: Diabetes mellitus without complication ([facility]) No date: HTN (hypertension) No date: Hyperlipemia No date: Left renal mass No date: Platelets decreased ([facility])     Comment:  DENIES UNSUAL BLEEDING  No date: PONV (postoperative nausea and vomiting) No date: WBC decreased   Reproductive/Obstetrics                             Anesthesia Physical Anesthesia Plan  ASA: III  Anesthesia Plan: General   Post-op Pain Management:    Induction: Intravenous  PONV Risk Score and Plan: 2 and Propofol infusion  Airway Management Planned: Natural Airway  Additional Equipment:   Intra-op Plan:   Post-operative Plan:   Informed Consent: I have reviewed the patients History  and Physical, chart, labs and discussed the procedure including the risks, benefits and alternatives for the proposed anesthesia with the patient or authorized representative who has indicated his/her understanding and acceptance.     Dental advisory given  Plan Discussed with: CRNA and Anesthesiologist  Anesthesia Plan Comments:         Anesthesia Quick Evaluation

## 2019-04-22 NOTE — Op Note (Signed)
[facility] Gastroenterology Patient Name: Henry Moore Procedure Date: 04/22/2019 7:55 AM MRN: [id number] Account #: [id number] Date of Birth: 1960/10/20 Admit Type: Outpatient Age: 58 Room: [facility] ENDO ROOM 1 Gender: Male Note Status: Finalized Procedure:            Upper GI endoscopy Indications:          Follow-up of esophageal varices Providers:            [name] MD, MD Medicines:            Monitored Anesthesia Care Complications:        No immediate complications. Procedure:            Pre-Anesthesia Assessment:                       - Prior to the procedure, a History and Physical was                        performed, and patient medications, allergies and                        sensitivities were reviewed. The patient's tolerance of                        previous anesthesia was reviewed.                       - The risks and benefits of the procedure and the                        sedation options and risks were discussed with the                        patient. All questions were answered and informed                        consent was obtained.                       - ASA Grade Assessment: III - A patient with severe                        systemic disease.                       After obtaining informed consent, the endoscope was                        passed under direct vision. Throughout the procedure,                        the patient's blood pressure, pulse, and oxygen                        saturations were monitored continuously. The Endoscope                        was introduced through the mouth, and advanced to the                        third part of duodenum. The upper GI endoscopy was  accomplished with ease. The patient tolerated the                        procedure well. Findings:      The examined duodenum was normal.      Severe portal hypertensive gastropathy was found in the entire examined        stomach.      The cardia and gastric fundus were normal on retroflexion.      Grade I varices were found in the middle third of the esophagus and in       the lower third of the esophagus. They were small in size. Impression:           - Normal examined duodenum.                       - Portal hypertensive gastropathy.                       - Grade I esophageal varices.                       - No specimens collected. Recommendation:       - Discharge patient to home (with escort).                       - Resume previous diet.                       - Continue present medications.                       - Repeat upper endoscopy in 6 months for surveillance.                       - Return to GI office in 2 weeks. Procedure Code(s):    --- Professional ---                       [phone], Esophagogastroduodenoscopy, flexible, transoral;                        diagnostic, including collection of specimen(s) by                        brushing or washing, when performed (separate procedure) Diagnosis Code(s):    --- Professional ---                       K76.6, Portal hypertension                       K31.89, Other diseases of stomach and duodenum                       I85.00, Esophageal varices without bleeding CPT copyright 2019 American Medical Association. All rights reserved. The codes documented in this report are preliminary and upon coder review may  be revised to meet current compliance requirements. [name], MD [name] MD, MD 04/22/2019 8:20:30 AM This report has been signed electronically. Number of Addenda: 0 Note Initiated On: 04/22/2019 7:55 AM Estimated Blood Loss: Estimated blood loss: none.      [facility]

## 2019-04-22 NOTE — Anesthesia Procedure Notes (Signed)
Date/Time: 04/22/2019 8:10 AM Performed by: Johnna Acosta, CRNA Pre-anesthesia Checklist: Patient identified, Emergency Drugs available, Suction available, Patient being monitored and Timeout performed Oxygen Delivery Method: Nasal cannula Preoxygenation: Pre-oxygenation with 100% oxygen Induction Type: IV induction

## 2019-04-23 ENCOUNTER — Encounter: Payer: Self-pay | Admitting: Gastroenterology

## 2019-04-30 ENCOUNTER — Other Ambulatory Visit: Payer: Self-pay

## 2019-05-01 ENCOUNTER — Encounter: Payer: Self-pay | Admitting: Gastroenterology

## 2019-05-01 ENCOUNTER — Other Ambulatory Visit: Payer: Self-pay

## 2019-05-01 ENCOUNTER — Ambulatory Visit (INDEPENDENT_AMBULATORY_CARE_PROVIDER_SITE_OTHER): Payer: BC Managed Care – PPO | Admitting: Gastroenterology

## 2019-05-01 VITALS — BP 128/76 | HR 71 | Temp 98.1°F | Wt 247.1 lb

## 2019-05-01 DIAGNOSIS — K7469 Other cirrhosis of liver: Secondary | ICD-10-CM

## 2019-05-01 DIAGNOSIS — K760 Fatty (change of) liver, not elsewhere classified: Secondary | ICD-10-CM | POA: Diagnosis not present

## 2019-05-01 DIAGNOSIS — I8510 Secondary esophageal varices without bleeding: Secondary | ICD-10-CM | POA: Diagnosis not present

## 2019-05-01 NOTE — Progress Notes (Addendum)
Henry Bellows MD, MRCP(U.K) [address]  [place]  [place], [place] [address]  Main: [phone]  Fax: [phone]   Primary Care Physician: Henry Billet, MD  Primary Gastroenterologist:  Dr. Jonathon Moore   Follow-up for liver cirrhosis  HPI: Henry Moore is a 58 y.o. male   Summary of history : He was initially referred and seen on 10/15/2018 when abdominal imaging incidentally discovered cirrhosis of the liver..   Small volume of ascites also noted on imaging.  He denies any tattoos, incarceration or drug use.    The present weight was the heaviest he has ever weighed.  Over the weekend he has had about 6 beers but none during the week days.  No issues with confusion suspicious of hence hepatic encephalopathy at initial presentation.    Underwent laparoscopic nephrectomy in 10/18/2018. 10/15/2018: Labs are negative for autoimmune hepatitis and viral hepatitis.  Not immune to hepatitis A and B.  Interval history 01/16/2019-05/01/2019 No change in weight since last visit 02/06/2019: EGD: Large esophageal varices seen which were banded with 3 bands. 03/04/2019: EGD: Esophageal varices were banded.  Also noted to have portal hypertensive gastropathy. 04/22/2019: EGD: Grade 1 esophageal varices that were not requiring any banding.  Portal hypertensive gastropathy. 03/11/2019 right upper quadrant ultrasound: Cirrhotic liver but no masses seen  04/17/2019: [facility] hepatology visit.  Was referred as he had an elevated meld score.  Is elevated to consume a low-salt diet.  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  No cigarettes for over 5 years. Denies any constipation or episodes of confusion.  No NSAID use.  BP 128/76 (BP Location: Left Arm, Patient Position: Sitting, Cuff Size: Normal)   Pulse 71   Temp 98.1 F (36.7 C) (Oral)   Wt 247 lb 2 oz (112.1 kg)   BMI 32.60 kg/m   Current Outpatient Medications  Medication Sig Dispense Refill  . ACCU-CHEK AVIVA PLUS test  strip CHECK BL00D SUGAR ONCE OR TWICE DAILY. (Patient taking differently: 1 each by Other route as needed. ) 100 each PRN  . carvedilol (COREG) 25 MG tablet Take 1 tablet (25 mg total) by mouth 2 (two) times daily with a meal. 180 tablet 3  . glipiZIDE (GLUCOTROL) 5 MG tablet Take 1 tablet (5 mg total) by mouth daily before breakfast. 90 tablet 3  . hydrochlorothiazide (HYDRODIURIL) 25 MG tablet TAKE 1 TABLET DAILY. 90 tablet 0  . losartan (COZAAR) 100 MG tablet Take 1 tablet by mouth daily. 90 tablet 0  . lovastatin (MEVACOR) 40 MG tablet Take 1 tablet (40 mg total) by mouth at bedtime. 90 tablet 3  . metFORMIN (GLUCOPHAGE-XR) 500 MG 24 hr tablet      No current facility-administered medications for this visit.     Allergies as of 05/01/2019 - Review Complete 05/01/2019  Allergen Reaction Noted  . Codeine Itching 04/14/2014  . Mucinex [guaifenesin er] Anxiety 05/21/2017    ROS:  General: Negative for anorexia, weight loss, fever, chills, fatigue, weakness. ENT: Negative for hoarseness, difficulty swallowing , nasal congestion. CV: Negative for chest pain, angina, palpitations, dyspnea on exertion, peripheral edema.  Respiratory: Negative for dyspnea at rest, dyspnea on exertion, cough, sputum, wheezing.  GI: See history of present illness. GU:  Negative for dysuria, hematuria, urinary incontinence, urinary frequency, nocturnal urination.  Endo: Negative for unusual weight change.    Physical Examination:   BP 128/76 (BP Location: Left Arm, Patient Position: Sitting, Cuff Size: Normal)   Pulse 71  Temp 98.1 F (36.7 C) (Oral)   Wt 247 lb 2 oz (112.1 kg)   BMI 32.60 kg/m   General: Well-nourished, well-developed in no acute distress.  Eyes: No icterus. Conjunctivae pink. Mouth: Oropharyngeal mucosa moist and pink , no lesions erythema or exudate. Lungs: Clear to auscultation bilaterally. Non-labored. Heart: Regular rate and rhythm, no murmurs rubs or gallops.  Abdomen: Bowel  sounds are normal, nontender, nondistended, no hepatosplenomegaly or masses, no abdominal bruits or hernia , no rebound or guarding.   Extremities: No lower extremity edema. No clubbing or deformities. Neuro: Alert and oriented x 3.  Grossly intact. Skin: Warm and dry, no jaundice.   Psych: Alert and cooperative, normal mood and affect.   Imaging Studies: No results found.  Assessment and Plan:   Henry Moore is a 58 y.o. y/o male here to follow-up for cirrhosis of the liver incidentally diagnosed in March 2020.  Has had esophageal varices without bleeding that have been banded and eradicated.  Weight has been stable.  Likely NAFLD/alcoholic fatty liver disease.  Doing well.    As his meld score was 15 was referred to [facility] and has been evaluated.  Has been doing well.  Plan 1.   He is due for his third dose of hepatitis A and B vaccine.  Following which we will check for his immune status 2. Low salt diet.  Recheck labs to calculate new meld score. 3. Continue to stop all alcohol consumption congratulated on completing over a year of cessation 4.  [facility] liver to screen for [facility] February 2021 5. EGD to screen for varices April 2021 6.  Continue carvedilol for esophageal varices prophylaxis from bleeding.   Dr Henry Bellows  MD,MRCP [facility]) Follow up in 6 months

## 2019-05-03 LAB — COMP. METABOLIC PANEL (12)
AST: 26 IU/L (ref 0–40)
Albumin/Globulin Ratio: 1.3 (ref 1.2–2.2)
Albumin: 4.4 g/dL (ref 3.8–4.9)
Alkaline Phosphatase: 162 IU/L — ABNORMAL HIGH (ref 39–117)
BUN/Creatinine Ratio: 14 (ref 9–20)
BUN: 28 mg/dL — ABNORMAL HIGH (ref 6–24)
Bilirubin Total: 0.7 mg/dL (ref 0.0–1.2)
Calcium: 9.1 mg/dL (ref 8.7–10.2)
Chloride: 105 mmol/L (ref 96–106)
Creatinine, Ser: 2.01 mg/dL — ABNORMAL HIGH (ref 0.76–1.27)
GFR calc Af Amer: 42 mL/min/{1.73_m2} — ABNORMAL LOW (ref >59)
GFR calc non Af Amer: 36 mL/min/{1.73_m2} — ABNORMAL LOW (ref >59)
Globulin, Total: 3.4 g/dL (ref 1.5–4.5)
Glucose: 234 mg/dL — ABNORMAL HIGH (ref 65–99)
Potassium: 5.1 mmol/L (ref 3.5–5.2)
Sodium: 138 mmol/L (ref 134–144)
Total Protein: 7.8 g/dL (ref 6.0–8.5)

## 2019-05-03 LAB — CBC
Hematocrit: 31.7 % — ABNORMAL LOW (ref 37.5–51.0)
Hemoglobin: 10.6 g/dL — ABNORMAL LOW (ref 13.0–17.7)
MCH: 31 pg (ref 26.6–33.0)
MCHC: 33.4 g/dL (ref 31.5–35.7)
MCV: 93 fL (ref 79–97)
Platelets: 71 10*3/uL — CL (ref 150–450)
RBC: 3.42 x10E6/uL — ABNORMAL LOW (ref 4.14–5.80)
RDW: 13.2 % (ref 11.6–15.4)
WBC: 2.4 10*3/uL — CL (ref 3.4–10.8)

## 2019-05-03 LAB — HEPATITIS B SURFACE ANTIBODY,QUALITATIVE: Hep B Surface Ab, Qual: NONREACTIVE

## 2019-05-03 LAB — PROTIME-INR
INR: 1.2 (ref 0.8–1.2)
Prothrombin Time: 12.3 s — ABNORMAL HIGH (ref 9.1–12.0)

## 2019-05-03 LAB — HEPATITIS A ANTIBODY, TOTAL: hep A Total Ab: NEGATIVE

## 2019-05-04 ENCOUNTER — Other Ambulatory Visit: Payer: Self-pay | Admitting: Family Medicine

## 2019-05-04 ENCOUNTER — Encounter: Payer: Self-pay | Admitting: Gastroenterology

## 2019-05-04 DIAGNOSIS — E785 Hyperlipidemia, unspecified: Secondary | ICD-10-CM

## 2019-05-09 ENCOUNTER — Other Ambulatory Visit: Payer: Self-pay

## 2019-05-09 ENCOUNTER — Telehealth: Payer: Self-pay

## 2019-05-09 DIAGNOSIS — K7469 Other cirrhosis of liver: Secondary | ICD-10-CM

## 2019-05-09 NOTE — Telephone Encounter (Signed)
Spoke with pt and informed him of lab results and Dr. Georgeann Oppenheim instructions. Pt is aware of where to go for the additional lab tests. Pt states he does see a kidney specialist at [facility] in [place], Dr. Link Snuffer. I explained that I will fax a copy of results. Pt understands and agrees.

## 2019-05-09 NOTE — Telephone Encounter (Signed)
-----  Message from [name], MD sent at 05/04/2019  9:21 AM EDT ----- Inform not immune to hep A/B needs vaccination   Alk phos elevated check PTH,Ca,Vitamin D, GGT. Can you check if he sees a nephrologist for kidneys -elevated creatinine- if yes send recent labs copy with highlight of elevated creatinine- if not seeing nephrologist please refer to them   Dr [name] MD,MRCP [facility]) Gastroenterology/Hepatology Pager: [phone]

## 2019-05-12 ENCOUNTER — Other Ambulatory Visit
Admission: RE | Admit: 2019-05-12 | Discharge: 2019-05-12 | Disposition: A | Discharge status: Home / Self Care | Payer: BC Managed Care – PPO | Source: Ambulatory Visit | Attending: Gastroenterology | Admitting: Gastroenterology

## 2019-05-12 DIAGNOSIS — K7469 Other cirrhosis of liver: Secondary | ICD-10-CM | POA: Diagnosis not present

## 2019-05-12 LAB — GAMMA GT: GGT: 111 U/L — ABNORMAL HIGH (ref 7–50)

## 2019-05-13 LAB — VITAMIN D 25 HYDROXY (VIT D DEFICIENCY, FRACTURES): Vit D, 25-Hydroxy: 18.12 ng/mL — ABNORMAL LOW (ref 30–100)

## 2019-05-13 LAB — PTH, INTACT AND CALCIUM
Calcium, Total (PTH): 8.9 mg/dL (ref 8.7–10.2)
PTH: 45 pg/mL (ref 15–65)

## 2019-05-18 ENCOUNTER — Encounter: Payer: Self-pay | Admitting: Gastroenterology

## 2019-05-19 ENCOUNTER — Telehealth: Payer: Self-pay

## 2019-05-19 DIAGNOSIS — R7989 Other specified abnormal findings of blood chemistry: Secondary | ICD-10-CM

## 2019-05-19 NOTE — Telephone Encounter (Signed)
-----   Message from [name], MD sent at 05/18/2019  9:07 PM EDT ----- [name] inform low vitamin D- check if has seen a nephrologist if not needs referral , in addition commence on Vitamin D2 50,000 IU once a week for 8 weeks and follow up with his pcp for the same.   Send copy of results to pcp   Dr [name] MD,MRCP [facility]) Gastroenterology/Hepatology Pager: [phone]

## 2019-05-19 NOTE — Telephone Encounter (Signed)
Spoke with Henry Moore and informed him of lab results and Dr. Georgeann Oppenheim instructions to commence on Vitamin D2 and follow up with his nephrologist. Henry Moore agrees to start the Vitamin D2. I informed Henry Moore that we will have to refer him to a nephrologist, I explained eventhough Dr. Gloriann Loan removed Henry Moore kidney, he doesn't specialize in nephrology. Henry Moore understands and agrees. We have referred Henry Moore to [facility]. I informed Henry Moore that we will also fax the lab results to his pcp, he agrees.

## 2019-06-30 DIAGNOSIS — Z905 Acquired absence of kidney: Secondary | ICD-10-CM | POA: Insufficient documentation

## 2019-06-30 DIAGNOSIS — I8500 Esophageal varices without bleeding: Secondary | ICD-10-CM | POA: Insufficient documentation

## 2019-06-30 DIAGNOSIS — N1832 Chronic kidney disease, stage 3b: Secondary | ICD-10-CM | POA: Insufficient documentation

## 2019-07-07 ENCOUNTER — Other Ambulatory Visit: Payer: Self-pay

## 2019-07-07 DIAGNOSIS — E1140 Type 2 diabetes mellitus with diabetic neuropathy, unspecified: Secondary | ICD-10-CM

## 2019-07-07 DIAGNOSIS — I10 Essential (primary) hypertension: Secondary | ICD-10-CM

## 2019-07-07 NOTE — Telephone Encounter (Signed)
[place] [address] faxed a Rx refill request on glipizide 5 mg tablets, carvedilol 25 mg tab

## 2019-07-28 ENCOUNTER — Other Ambulatory Visit: Payer: Self-pay

## 2019-07-28 ENCOUNTER — Telehealth: Payer: Self-pay

## 2019-07-28 ENCOUNTER — Inpatient Hospital Stay: Payer: BC Managed Care – PPO

## 2019-07-28 ENCOUNTER — Encounter (INDEPENDENT_AMBULATORY_CARE_PROVIDER_SITE_OTHER): Payer: Self-pay

## 2019-07-28 ENCOUNTER — Encounter: Payer: Self-pay | Admitting: Oncology

## 2019-07-28 ENCOUNTER — Inpatient Hospital Stay: Payer: BC Managed Care – PPO | Attending: Oncology | Admitting: Oncology

## 2019-07-28 VITALS — BP 150/69 | HR 81 | Temp 97.9°F | Resp 18 | Ht 73.0 in | Wt 245.4 lb

## 2019-07-28 DIAGNOSIS — Z7189 Other specified counseling: Secondary | ICD-10-CM | POA: Diagnosis not present

## 2019-07-28 DIAGNOSIS — R188 Other ascites: Secondary | ICD-10-CM | POA: Insufficient documentation

## 2019-07-28 DIAGNOSIS — I2510 Atherosclerotic heart disease of native coronary artery without angina pectoris: Secondary | ICD-10-CM | POA: Insufficient documentation

## 2019-07-28 DIAGNOSIS — Z87891 Personal history of nicotine dependence: Secondary | ICD-10-CM | POA: Diagnosis not present

## 2019-07-28 DIAGNOSIS — Z79899 Other long term (current) drug therapy: Secondary | ICD-10-CM | POA: Insufficient documentation

## 2019-07-28 DIAGNOSIS — R161 Splenomegaly, not elsewhere classified: Secondary | ICD-10-CM | POA: Diagnosis not present

## 2019-07-28 DIAGNOSIS — K7460 Unspecified cirrhosis of liver: Secondary | ICD-10-CM | POA: Insufficient documentation

## 2019-07-28 DIAGNOSIS — I129 Hypertensive chronic kidney disease with stage 1 through stage 4 chronic kidney disease, or unspecified chronic kidney disease: Secondary | ICD-10-CM | POA: Insufficient documentation

## 2019-07-28 DIAGNOSIS — C642 Malignant neoplasm of left kidney, except renal pelvis: Secondary | ICD-10-CM | POA: Insufficient documentation

## 2019-07-28 DIAGNOSIS — M549 Dorsalgia, unspecified: Secondary | ICD-10-CM | POA: Diagnosis not present

## 2019-07-28 DIAGNOSIS — E785 Hyperlipidemia, unspecified: Secondary | ICD-10-CM | POA: Diagnosis not present

## 2019-07-28 DIAGNOSIS — C7800 Secondary malignant neoplasm of unspecified lung: Secondary | ICD-10-CM | POA: Diagnosis not present

## 2019-07-28 DIAGNOSIS — E1122 Type 2 diabetes mellitus with diabetic chronic kidney disease: Secondary | ICD-10-CM | POA: Diagnosis not present

## 2019-07-28 DIAGNOSIS — C649 Malignant neoplasm of unspecified kidney, except renal pelvis: Secondary | ICD-10-CM

## 2019-07-28 DIAGNOSIS — Z7984 Long term (current) use of oral hypoglycemic drugs: Secondary | ICD-10-CM | POA: Insufficient documentation

## 2019-07-28 DIAGNOSIS — G8929 Other chronic pain: Secondary | ICD-10-CM | POA: Insufficient documentation

## 2019-07-28 DIAGNOSIS — D696 Thrombocytopenia, unspecified: Secondary | ICD-10-CM | POA: Diagnosis not present

## 2019-07-28 LAB — COMPREHENSIVE METABOLIC PANEL
ALT: 26 U/L (ref 0–44)
AST: 29 U/L (ref 15–41)
Albumin: 4.2 g/dL (ref 3.5–5.0)
Alkaline Phosphatase: 124 U/L (ref 38–126)
Anion gap: 6 (ref 5–15)
BUN: 21 mg/dL — ABNORMAL HIGH (ref 6–20)
CO2: 23 mmol/L (ref 22–32)
Calcium: 9.1 mg/dL (ref 8.9–10.3)
Chloride: 106 mmol/L (ref 98–111)
Creatinine, Ser: 1.75 mg/dL — ABNORMAL HIGH (ref 0.61–1.24)
GFR calc Af Amer: 49 mL/min — ABNORMAL LOW (ref >60)
GFR calc non Af Amer: 43 mL/min — ABNORMAL LOW (ref >60)
Glucose, Bld: 274 mg/dL — ABNORMAL HIGH (ref 70–99)
Potassium: 4.5 mmol/L (ref 3.5–5.1)
Sodium: 135 mmol/L (ref 135–145)
Total Bilirubin: 1 mg/dL (ref 0.3–1.2)
Total Protein: 8.4 g/dL — ABNORMAL HIGH (ref 6.5–8.1)

## 2019-07-28 LAB — CBC WITH DIFFERENTIAL/PLATELET
Abs Immature Granulocytes: 0.01 10*3/uL (ref 0.00–0.07)
Basophils Absolute: 0 10*3/uL (ref 0.0–0.1)
Basophils Relative: 1 %
Eosinophils Absolute: 0.1 10*3/uL (ref 0.0–0.5)
Eosinophils Relative: 3 %
HCT: 33 % — ABNORMAL LOW (ref 39.0–52.0)
Hemoglobin: 10.9 g/dL — ABNORMAL LOW (ref 13.0–17.0)
Immature Granulocytes: 0 %
Lymphocytes Relative: 15 %
Lymphs Abs: 0.5 10*3/uL — ABNORMAL LOW (ref 0.7–4.0)
MCH: 30.2 pg (ref 26.0–34.0)
MCHC: 33 g/dL (ref 30.0–36.0)
MCV: 91.4 fL (ref 80.0–100.0)
Monocytes Absolute: 0.3 10*3/uL (ref 0.1–1.0)
Monocytes Relative: 8 %
Neutro Abs: 2.4 10*3/uL (ref 1.7–7.7)
Neutrophils Relative %: 73 %
Platelets: 75 10*3/uL — ABNORMAL LOW (ref 150–400)
RBC: 3.61 MIL/uL — ABNORMAL LOW (ref 4.22–5.81)
RDW: 13.2 % (ref 11.5–15.5)
WBC: 3.4 10*3/uL — ABNORMAL LOW (ref 4.0–10.5)
nRBC: 0 % (ref 0.0–0.2)

## 2019-07-28 LAB — LACTATE DEHYDROGENASE: LDH: 105 U/L (ref 98–192)

## 2019-07-28 NOTE — Progress Notes (Signed)
Patient here for initial visit.  Referred from Dr. Gloriann Loan for renal cell carcinoma.

## 2019-07-28 NOTE — Progress Notes (Signed)
Hematology/Oncology Consult note [facility] [phone] [phone]   Patient Care Team: Albina Billet, MD as PCP - General (Internal Medicine)  REFERRING PROVIDER: Lucas Mallow, MD  CHIEF COMPLAINTS/REASON FOR VISIT:  Evaluation of kidney cancer  HISTORY OF PRESENTING ILLNESS:   Henry Moore is a  58 y.o.  male with PMH listed below was seen in consultation at the request of  Lucas Mallow, MD  for evaluation of kidney cancer Extensive medical records were reviewed Patient had MRI lumbar spine without contrast done on 07/13/2018 which showed mired lumbar degenerative disease. CT thoracic spine was done on 08/05/2018 which showed 5 cm left renal mass. 09/04/2018 CT abdomen and pelvis with and without contrast showed 5 cm round enhancing solid mass in the left kidney.  No involvement of left renal vein.  No lymphadenopathy Morphologic changes consistent with cirrhosis and the portal hypertension.  Small volume ascites and splenomegaly. . 10/16/2018 Left nephrectomy showed renal cell carcinoma, clear-cell type, nuclear grade 4, tumor extends into the renal vein and renal sinus fat.  Urethral, vascular and or resection margins are negative for tumor pT3a Nx Mx  01/28/2019 patient had another CT chest abdomen pelvis done with contrast Showed interval left nephrectomy.  Scattered tiny pulmonary nodules bilaterally.  Nonspecific.  No other evidence of metastatic disease.  Cirrhosis changes extensive coronary and aortic atherosclerosis  07/14/2019 patient underwent surveillance CT chest abdomen pelvis without contrast Interval progression of pulmonary metastasis with new enlarged right paratracheal lymph node 1.7 cm, previously 0.6 cm one-point since worrisome for metastatic adenopathy.  Multiple progressive pulmonary nodules, index nodule within the lingula measures 1.3 cm, previously 3 mm. Index subpleural nodule within the anterior right  upper lobe measuring 1.8 cm, previously 3 cm Index nodule within the post lateral right lower lobe measuring 5 mm, new from previous care Aortic sclerosis.  Three-vessel coronary artery calcification noted.  Cirrhosis changes.  Splenomegaly.  Patient was referred to establish care with medical oncology for further discussion and evaluation of metastatic renal cell carcinoma. Patient reports feeling well at baseline today.  Denies any shortness of breath, cough, hemoptysis, back pain, headache. He quitted smoking 2015.  Not currently actively drinking alcohol as well. Patient has chronic back pain unchanged.   Review of Systems  Constitutional: Positive for fatigue. Negative for appetite change, chills, fever and unexpected weight change.  HENT:   Negative for hearing loss and voice change.   Eyes: Negative for eye problems and icterus.  Respiratory: Negative for chest tightness, cough and shortness of breath.   Cardiovascular: Negative for chest pain and leg swelling.  Gastrointestinal: Negative for abdominal distention and abdominal pain.  Endocrine: Negative for hot flashes.  Genitourinary: Negative for difficulty urinating, dysuria and frequency.   Musculoskeletal: Positive for back pain. Negative for arthralgias.  Skin: Negative for itching and rash.  Neurological: Negative for light-headedness and numbness.  Hematological: Negative for adenopathy. Does not bruise/bleed easily.  Psychiatric/Behavioral: Negative for confusion.    MEDICAL HISTORY:  Past Medical History:  Diagnosis Date  . Cough    SINUS DRAINAGE CAUSING COUGHING , REPORTS CLEAR MUCOUS   . Diabetes mellitus without complication ([place])   . HTN (hypertension)   . Hyperlipemia   . Left renal mass   . Platelets decreased ([place])    DENIES UNSUAL BLEEDING   . PONV (postoperative nausea and vomiting)   . WBC decreased     SURGICAL HISTORY: Past Surgical History:  Procedure  Laterality Date  . ANKLE SURGERY Left     . ANTERIOR CERVICAL DECOMP/DISCECTOMY FUSION N/A 04/16/2014   Procedure: ANTERIOR CERVICAL DECOMPRESSION/DISCECTOMY FUSION  (ACDF C5-C7)   (2 LEVELS)     ;  Surgeon: Melina Schools, MD;  Location: [place];  Service: Orthopedics;  Laterality: N/A;  . APPENDECTOMY    . BACK SURGERY    . ESOPHAGOGASTRODUODENOSCOPY (EGD) WITH PROPOFOL N/A 02/06/2019   Procedure: ESOPHAGOGASTRODUODENOSCOPY (EGD) WITH PROPOFOL;  Surgeon: [name], MD;  [facility] ENDOSCOPY;  Service: Gastroenterology;  Laterality: N/A;  . ESOPHAGOGASTRODUODENOSCOPY (EGD) WITH PROPOFOL N/A 03/04/2019   Procedure: ESOPHAGOGASTRODUODENOSCOPY (EGD) WITH PROPOFOL;  Surgeon: Lin Landsman, MD;  Location: [facility] ENDOSCOPY;  Service: Gastroenterology;  Laterality: N/A;  . ESOPHAGOGASTRODUODENOSCOPY (EGD) WITH PROPOFOL N/A 04/22/2019   Procedure: ESOPHAGOGASTRODUODENOSCOPY (EGD) WITH PROPOFOL;  Surgeon: [name], MD;  Location: [facility] ENDOSCOPY;  Service: Gastroenterology;  Laterality: N/A;  . FRACTURE SURGERY    . HYDROCELE EXCISION    . KNEE ARTHROSCOPY Bilateral   . LAPAROSCOPIC NEPHRECTOMY, HAND ASSISTED Left 10/16/2018   Procedure: LEFT HAND ASSISTED LAPAROSCOPIC NEPHRECTOMY;  Surgeon: Lucas Mallow, MD;  Location: [facility]S;  Service: Urology;  Laterality: Left;  . NASAL SINUS SURGERY     X 2  . 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 SHOULDER SURGERY Left     SOCIAL HISTORY: Social History   Socioeconomic History  . Marital status: Married    Spouse name: Not on file  . Number of children: Not on file  . Years of education: Not on file  . Highest education level: Not on file  Occupational History  . Not on file  Tobacco Use  . Smoking status: Former Smoker    Packs/day: 0.25    Years: 20.00    Pack years: 5.00    Quit date: 2015    Years since quitting: 5.9  . Smokeless tobacco: Never Used  Substance and Sexual Activity  . Alcohol use: Not Currently    Alcohol/week: 0.0 standard drinks    Comment: no  alchol in 1 year  . Drug use: No  . Sexual activity: Not on file  Other Topics Concern  . Not on file  Social History Narrative  . Not on file   Social Determinants of Health   Financial Resource Strain:   . Difficulty of Paying Living Expenses: Not on file  Food Insecurity:   . Worried About Charity fundraiser in the Last Year: Not on file  . Ran Out of Food in the Last Year: Not on file  Transportation Needs:   . Lack of Transportation (Medical): Not on file  . Lack of Transportation (Non-Medical): Not on file  Physical Activity:   . Days of Exercise per Week: Not on file  . Minutes of Exercise per Session: Not on file  Stress:   . Feeling of Stress : Not on file  Social Connections:   . Frequency of Communication with Friends and Family: Not on file  . Frequency of Social Gatherings with Friends and Family: Not on file  . Attends Religious Services: Not on file  . Active Member of Clubs or Organizations: Not on file  . Attends Archivist Meetings: Not on file  . Marital Status: Not on file  Intimate Partner Violence:   . Fear of Current or Ex-Partner: Not on file  . Emotionally Abused: Not on file  . Physically Abused: Not on file  . Sexually Abused: Not  on file    FAMILY HISTORY: Family History  Problem Relation Age of Onset  . Diabetes Mother   . Cancer Mother        neuroendocrine cancer  . Cancer Father        brain  . Cancer Maternal Grandmother        stomcah  . Alzheimer's disease Paternal Grandmother   . Lung cancer Paternal Grandfather     ALLERGIES:  is allergic to codeine and mucinex [guaifenesin er].  MEDICATIONS:  Current Outpatient Medications  Medication Sig Dispense Refill  . ACCU-CHEK AVIVA PLUS test strip CHECK BL00D SUGAR ONCE OR TWICE DAILY. (Patient taking differently: 1 each by Other route as needed. ) 100 each PRN  . amLODipine (NORVASC) 10 MG tablet Take 10 mg by mouth daily.    . carvedilol (COREG) 25 MG tablet Take 1  tablet (25 mg total) by mouth 2 (two) times daily with a meal. 180 tablet 3  . glipiZIDE (GLUCOTROL) 5 MG tablet Take 1 tablet (5 mg total) by mouth daily before breakfast. 90 tablet 3  . hydrochlorothiazide (HYDRODIURIL) 25 MG tablet TAKE 1 TABLET DAILY. 90 tablet 0  . losartan (COZAAR) 100 MG tablet Take 1 tablet by mouth daily. 90 tablet 0  . lovastatin (MEVACOR) 40 MG tablet Take 1 tablet (40 mg total) by mouth at bedtime. 60 tablet 0  . metFORMIN (GLUCOPHAGE-XR) 500 MG 24 hr tablet Take 500 mg by mouth daily with breakfast.      No current facility-administered medications for this visit.     PHYSICAL EXAMINATION: ECOG PERFORMANCE STATUS: 1 - Symptomatic but completely ambulatory Vitals:   07/28/19 1113  BP: (!) 150/69  Pulse: 81  Resp: 18  Temp: 97.9 F (36.6 C)  SpO2: 99%   Filed Weights   07/28/19 1113  Weight: 245 lb 6.4 oz (111.3 kg)    Physical Exam Constitutional:      General: He is not in acute distress.    Appearance: He is obese.  HENT:     Head: Normocephalic and atraumatic.  Eyes:     General: No scleral icterus.    Pupils: Pupils are equal, round, and reactive to light.  Cardiovascular:     Rate and Rhythm: Normal rate and regular rhythm.     Heart sounds: Normal heart sounds.  Pulmonary:     Effort: Pulmonary effort is normal. No respiratory distress.     Breath sounds: No wheezing.  Abdominal:     General: Bowel sounds are normal. There is no distension.     Palpations: Abdomen is soft. There is no mass.     Tenderness: There is no abdominal tenderness.  Musculoskeletal:        General: No deformity. Normal range of motion.     Cervical back: Normal range of motion and neck supple.  Skin:    General: Skin is warm and dry.     Findings: No erythema or rash.  Neurological:     Mental Status: He is alert and oriented to person, place, and time.     Cranial Nerves: No cranial nerve deficit.     Coordination: Coordination normal.  Psychiatric:         Mood and Affect: Mood normal.     LABORATORY DATA:  I have reviewed the data as listed Lab Results  Component Value Date   WBC 3.4 (L) 07/28/2019   HGB 10.9 (L) 07/28/2019   HCT 33.0 (L) 07/28/2019   MCV 91.4  07/28/2019   PLT 75 (L) 07/28/2019   Recent Labs    10/15/18 1325 10/16/18 1038 10/20/18 0518 10/21/18 0510 05/01/19 1413 05/12/19 1121 07/28/19 1208  NA 134  --  134* 135 138  --  135  K 4.4  --  3.7 3.6 5.1  --  4.5  CL 103   < > 105 106 105  --  106  CO2 20   < > 20* 20*  --   --  23  GLUCOSE 201*   < > 129* 126* 234*  --  274*  BUN 22   < > 30* 28* 28*  --  21*  CREATININE 1.41*   < > 1.83* 1.64* 2.01*  --  1.75*  CALCIUM 9.2   < > 7.7* 8.1* 9.1 8.9 9.1  GFRNONAA 56*   < > 41* 46* 36*  --  43*  GFRAA 64   < > 47* 54* 42*  --  49*  PROT 7.7  --   --   --  7.8  --  8.4*  ALBUMIN 4.3  --   --   --  4.4  --  4.2  AST 31  --   --   --  26  --  29  ALT 24  --   --   --   --   --  26  ALKPHOS 135*  --   --   --  162*  --  124  BILITOT 0.8  --   --   --  0.7  --  1.0   < > = values in this interval not displayed.   Iron/TIBC/Ferritin/ %Sat    Component Value Date/Time   IRON 105 10/15/2018 1325   TIBC 244 (L) 10/15/2018 1325   FERRITIN 588 (H) 10/15/2018 1325   IRONPCTSAT 43 10/15/2018 1325      RADIOGRAPHIC STUDIES: I have personally reviewed the radiological images as listed and agreed with the findings in the report.  No results found.    ASSESSMENT & PLAN:  1. Renal cell carcinoma of left kidney (HCC)   2. Malignant neoplasm metastatic to lung, unspecified laterality ([place])   3. Goals of care, counseling/discussion   4. Cirrhosis of liver with ascites, unspecified hepatic cirrhosis type ([place])   5. Thrombocytopenia ([place])   6. Splenomegaly    #Previous CT imaging reports were reviewed and discussed with patient in details. Patient has had left RCC pT3a Nx status post left nephrectomy Now with radiographic evidence of lung  metastasis. Clinically, patient has M1 disease now, discussed with patient that his condition is not curable.  Treatment is with palliative intent. Currently he is asymptomatic from his lung metastasis. I would like to obtain his surveillance CT images and have our radiologist review them on the tumor board. The role of PET scan is not determined.  If the consensus can be reached on tumor board that patient has metastatic disease, PET scan is not necessary.  I will proceed with a bone scan. I will obtain an MRI of brain with and without contrast to complete staging.  Would like to risk stratify patient.  Given that metastasis was found within 12 months of nephrectomy, patient is at least intermittent risk group.  Check CBC, CMP, LDH.  Will benefit from axitinib plus pembrolizumab or nivolumab plus ipilimumab regimen. Labs reviewed, [facility] prognostic model: Intermediate risk group, one-point from interval from diagnosis to treatment less than 1 year, serum hemoglobin less than lower limit of  normal.  We discussed about the rationale of chemoimmunotherapy treatments. Patient would need to have chemotherapy class done prior to the start of treatments.  #I will send foundation one testing on RCC tissue specimen.  #Liver cirrhosis with small amount of ascites/splenomegaly patient sees gastroenterology Dr. [name].  . He will get ultrasound surveillance due in February 2021 for screening of [place].  EGD surveillance for Barrett's plan in April 2021. Recommend patient to avoid alcohol.  Check liver function testing.    #Chronic thrombocytopenia, platelet count 75,000, at baseline.  Likely secondary to splenomegaly. #CKD, stage III Avoid nephrotoxic.  Follow-up plan to be determined based on tumor board discussion.  Orders Placed This Encounter  Procedures  . MR Brain W Wo Contrast    Standing Status:   Future    Standing Expiration Date:   07/27/2020    Order Specific Question:   ** REASON FOR EXAM  (FREE TEXT)    Answer:   kidney cancer staging    Order Specific Question:   If indicated for the ordered procedure, I authorize the administration of contrast media per Radiology protocol    Answer:   Yes    Order Specific Question:   What is the patient's sedation requirement?    Answer:   No Sedation    Order Specific Question:   Does the patient have a pacemaker or implanted devices?    Answer:   No    Order Specific Question:   Use SRS Protocol?    Answer:   Yes    Order Specific Question:   Radiology Contrast Protocol - do NOT remove file path    Answer:   [email or web address]    Order Specific Question:   Preferred imaging location?    Answer:   [facility] (table limit-400lbs)  . CBC with Differential/Platelet    Standing Status:   Future    Number of Occurrences:   1    Standing Expiration Date:   07/27/2020  . Comprehensive metabolic panel    Standing Status:   Future    Number of Occurrences:   1    Standing Expiration Date:   07/27/2020  . Lactate Dehydrogenase    Standing Status:   Future    Number of Occurrences:   1    Standing Expiration Date:   07/27/2020    All questions were answered. The patient knows to call the clinic with any problems questions or concerns.  cc Lucas Mallow, MD    Return of visit: To be determined. Thank you for this kind referral and the opportunity to participate in the care of this patient. A copy of today's note is routed to referring provider    Earlie Server, MD, PhD Hematology Oncology [facility] Pager- [phone] 07/28/2019

## 2019-07-28 NOTE — Telephone Encounter (Signed)
[employer] Heme submitted online (order [id number]).

## 2019-07-31 ENCOUNTER — Other Ambulatory Visit: Payer: BC Managed Care – PPO

## 2019-07-31 NOTE — Progress Notes (Signed)
Tumor Board Documentation  Henry Moore was presented by Dr Tasia Catchings at our Tumor Board on 07/31/2019, which included representatives from medical oncology, radiation oncology, surgical oncology, surgical, radiology, pathology, navigation, genetics, pharmacy, internal medicine, research, palliative care.  Henry Moore currently presents as a new patient, for [place], for new positive pathology with history of the following treatments: surgical intervention(s), active survellience.  Additionally, we reviewed previous medical and familial history, history of present illness, and recent lab results along with all available histopathologic and imaging studies. The tumor board considered available treatment options and made the following recommendations: Additional screening, Chemotherapy, Immunotherapy    The following procedures/referrals were also placed: No orders of the defined types were placed in this encounter.   Clinical Trial Status: not discussed   Staging used: Pathologic Stage  AJCC Staging: T: 3 N: x M: 1 Group: Renal Cell Carcinoma   National site-specific guidelines NCCN were discussed with respect to the case.  Tumor board is a meeting of clinicians from various specialty areas who evaluate and discuss patients for whom a multidisciplinary approach is being considered. Final determinations in the plan of care are those of the provider(s). The responsibility for follow up of recommendations given during tumor board is that of the provider.   Today's extended care, comprehensive team conference, D[place] was not present for the discussion and was not examined.   Multidisciplinary Tumor Board is a multidisciplinary case peer review process.  Decisions discussed in the Multidisciplinary Tumor Board reflect the opinions of the specialists present at the conference without having examined the patient.  Ultimately, treatment and diagnostic decisions rest with the primary provider(s) and the  patient.

## 2019-08-04 ENCOUNTER — Other Ambulatory Visit: Payer: Self-pay | Admitting: Oncology

## 2019-08-04 ENCOUNTER — Encounter: Payer: Self-pay | Admitting: Oncology

## 2019-08-04 DIAGNOSIS — C649 Malignant neoplasm of unspecified kidney, except renal pelvis: Secondary | ICD-10-CM

## 2019-08-04 DIAGNOSIS — Z7189 Other specified counseling: Secondary | ICD-10-CM | POA: Insufficient documentation

## 2019-08-04 HISTORY — DX: Malignant neoplasm of unspecified kidney, except renal pelvis: C64.9

## 2019-08-04 NOTE — Progress Notes (Signed)
START ON PATHWAY REGIMEN - Renal Cell     A cycle is every 21 days:     Nivolumab      Ipilimumab    A cycle is every 28 days:     Nivolumab   **Always confirm dose/schedule in your pharmacy ordering system**  Patient Characteristics: Stage IV/Metastatic Disease, Clear Cell, First Line, Intermediate or Poor Risk Therapeutic Status: Stage IV/Metastatic Disease Histology: Clear Cell Line of Therapy: First Line Risk Status: Intermediate Risk Intent of Therapy: Non-Curative / Palliative Intent, Discussed with Patient 

## 2019-08-05 ENCOUNTER — Ambulatory Visit
Admission: RE | Admit: 2019-08-05 | Discharge: 2019-08-05 | Disposition: A | Discharge status: Home / Self Care | Payer: BC Managed Care – PPO | Source: Ambulatory Visit | Attending: Oncology | Admitting: Oncology

## 2019-08-05 ENCOUNTER — Other Ambulatory Visit: Payer: Self-pay

## 2019-08-05 DIAGNOSIS — C642 Malignant neoplasm of left kidney, except renal pelvis: Secondary | ICD-10-CM | POA: Diagnosis not present

## 2019-08-05 MED ORDER — GADOBUTROL 1 MMOL/ML IV SOLN
10.0000 mL | Freq: Once | INTRAVENOUS | Status: AC | PRN
  Administered 2019-08-05: 10 mL via INTRAVENOUS

## 2019-08-06 ENCOUNTER — Telehealth: Payer: Self-pay

## 2019-08-06 ENCOUNTER — Other Ambulatory Visit: Payer: Self-pay

## 2019-08-06 NOTE — Telephone Encounter (Signed)
Late entry: [place] urology specialist  to obtain CD with images of CT scans done on 01/18/19 &07/14/19, as requested by Dr.Yu. Release of information form was singed and faxed to [facility] urology.  Received CDs with printed reports on 1/5. Contacted Radiology to inform them of obatained CDs but they states that those images were already uploaded into Epic. Will give the copy of CD to patient when he comes in.

## 2019-08-06 NOTE — Patient Instructions (Signed)
Nivolumab injection What is this medicine? NIVOLUMAB (nye VOL ue mab) is a monoclonal antibody. It is used to treat colon cancer, esophageal cancer, head and neck cancer, Hodgkin lymphoma, kidney cancer, liver cancer, lung cancer, mesothelioma, melanoma, and urothelial cancer. This medicine may be used for other purposes; ask your health care provider or pharmacist if you have questions. COMMON BRAND NAME(S): Opdivo What should I tell my health care provider before I take this medicine? They need to know if you have any of these conditions:  diabetes  immune system problems  kidney disease  liver disease  lung disease  organ transplant  stomach or intestine problems  thyroid disease  an unusual or allergic reaction to nivolumab, other medicines, foods, dyes, or preservatives  pregnant or trying to get pregnant  breast-feeding How should I use this medicine? This medicine is for infusion into a vein. It is given by a health care professional in a hospital or clinic setting. A special MedGuide will be given to you before each treatment. Be sure to read this information carefully each time. Talk to your pediatrician regarding the use of this medicine in children. While this drug may be prescribed for children as young as 12 years for selected conditions, precautions do apply. Overdosage: If you think you have taken too much of this medicine contact a poison control center or emergency room at once. NOTE: This medicine is only for you. Do not share this medicine with others. What if I miss a dose? It is important not to miss your dose. Call your doctor or health care professional if you are unable to keep an appointment. What may interact with this medicine? Interactions have not been studied. Give your health care provider a list of all the medicines, herbs, non-prescription drugs, or dietary supplements you use. Also tell them if you smoke, drink alcohol, or use illegal drugs.  Some items may interact with your medicine. This list may not describe all possible interactions. Give your health care provider a list of all the medicines, herbs, non-prescription drugs, or dietary supplements you use. Also tell them if you smoke, drink alcohol, or use illegal drugs. Some items may interact with your medicine. What should I watch for while using this medicine? This drug may make you feel generally unwell. Continue your course of treatment even though you feel ill unless your doctor tells you to stop. You may need blood work done while you are taking this medicine. Do not become pregnant while taking this medicine or for 5 months after stopping it. Women should inform their doctor if they wish to become pregnant or think they might be pregnant. There is a potential for serious side effects to an unborn child. Talk to your health care professional or pharmacist for more information. Do not breast-feed an infant while taking this medicine or for 5 months after stopping it. What side effects may I notice from receiving this medicine? Side effects that you should report to your doctor or health care professional as soon as possible:  allergic reactions like skin rash, itching or hives, swelling of the face, lips, or tongue  breathing problems  blood in the urine  bloody or watery diarrhea or black, tarry stools  changes in emotions or moods  changes in vision  chest pain  cough  dizziness  feeling faint or lightheaded, falls  fever, chills  headache with fever, neck stiffness, confusion, loss of memory, sensitivity to light, hallucination, loss of contact with reality, or   seizures  joint pain  mouth sores  redness, blistering, peeling or loosening of the skin, including inside the mouth  severe muscle pain or weakness  signs and symptoms of high blood sugar such as dizziness; dry mouth; dry skin; fruity breath; nausea; stomach pain; increased hunger or thirst;  increased urination  signs and symptoms of kidney injury like trouble passing urine or change in the amount of urine  signs and symptoms of liver injury like dark yellow or brown urine; general ill feeling or flu-like symptoms; light-colored stools; loss of appetite; nausea; right upper belly pain; unusually weak or tired; yellowing of the eyes or skin  swelling of the ankles, feet, hands  trouble passing urine or change in the amount of urine  unusually weak or tired  weight gain or loss Side effects that usually do not require medical attention (report to your doctor or health care professional if they continue or are bothersome):  bone pain  constipation  decreased appetite  diarrhea  muscle pain  nausea, vomiting  tiredness This list may not describe all possible side effects. Call your doctor for medical advice about side effects. You may report side effects to FDA at 1-800-FDA-1088. Where should I keep my medicine? This drug is given in a hospital or clinic and will not be stored at home. NOTE: This sheet is a summary. It may not cover all possible information. If you have questions about this medicine, talk to your doctor, pharmacist, or health care provider.  2020 [employer] (2019-05-06 10:04:50) Ipilimumab injection What is this medicine? IPILIMUMAB (IP i LIM ue mab) is a monoclonal antibody. It is used to treat colorectal cancer, kidney cancer, liver cancer, lung cancer, melanoma, and mesothelioma. This medicine may be used for other purposes; ask your health care provider or pharmacist if you have questions. COMMON BRAND NAME(S): YERVOY What should I tell my health care provider before I take this medicine? They need to know if you have any of these conditions:  Addison's disease  blood in your stools (black or tarry stools) or if you have blood in your vomit  eye disease, vision problems  history of pancreatitis  history of stomach  bleeding  immune system problems  inflammatory bowel disease  kidney disease  liver disease  lupus  myasthenia gravis  organ transplant  rheumatoid arthritis  sarcoidosis  stomach or intestine problems  thyroid disease  tingling of the fingers or toes, or other nerve disorder  an unusual or allergic reaction to ipilimumab, other medicines, foods, dyes, or preservatives  pregnant or trying to get pregnant  breast-feeding How should I use this medicine? This medicine is for infusion into a vein. It is given by a health care professional in a hospital or clinic setting. A special MedGuide will be given to you before each treatment. Be sure to read this information carefully each time. Talk to your pediatrician regarding the use of this medicine in children. While this drug may be prescribed for children as young as 12 years for selected conditions, precautions do apply. Overdosage: If you think you have taken too much of this medicine contact a poison control center or emergency room at once. NOTE: This medicine is only for you. Do not share this medicine with others. What if I miss a dose? It is important not to miss your dose. Call your doctor or health care professional if you are unable to keep an appointment. What may interact with this medicine? Interactions are not expected.   This list may not describe all possible interactions. Give your health care provider a list of all the medicines, herbs, non-prescription drugs, or dietary supplements you use. Also tell them if you smoke, drink alcohol, or use illegal drugs. Some items may interact with your medicine. What should I watch for while using this medicine? Tell your doctor or healthcare professional if your symptoms do not start to get better or if they get worse. Do not become pregnant while taking this medicine or for 3 months after stopping it. Women should inform their doctor if they wish to become pregnant or  think they might be pregnant. There is a potential for serious side effects to an unborn child. Talk to your health care professional or pharmacist for more information. Do not breast-feed an infant while taking this medicine or for 3 months after the last dose. Your condition will be monitored carefully while you are receiving this medicine. You may need blood work done while you are taking this medicine. What side effects may I notice from receiving this medicine? Side effects that you should report to your doctor or health care professional as soon as possible:  allergic reactions like skin rash, itching or hives, swelling of the face, lips, or tongue  black, tarry stools  bloody or watery diarrhea  changes in vision  dizziness  eye pain  fast, irregular heartbeat  feeling anxious  feeling faint or lightheaded, falls  nausea, vomiting  pain, tingling, numbness in the hands or feet  redness, blistering, peeling or loosening of the skin, including inside the mouth  signs and symptoms of liver injury like dark yellow or brown urine; general ill feeling or flu-like symptoms; light-colored stools; loss of appetite; nausea; right upper belly pain; unusually weak or tired; yellowing of the eyes or skin  unusual bleeding or bruising Side effects that usually do not require medical attention (report to your doctor or health care professional if they continue or are bothersome):  headache  loss of appetite  trouble sleeping This list may not describe all possible side effects. Call your doctor for medical advice about side effects. You may report side effects to FDA at 1-800-FDA-1088. Where should I keep my medicine? This drug is given in a hospital or clinic and will not be stored at home. NOTE: This sheet is a summary. It may not cover all possible information. If you have questions about this medicine, talk to your doctor, pharmacist, or health care provider.  2020  [employer] Standard (2019-05-06 10:56:55)  

## 2019-08-07 ENCOUNTER — Inpatient Hospital Stay (HOSPITAL_BASED_OUTPATIENT_CLINIC_OR_DEPARTMENT_OTHER): Payer: BC Managed Care – PPO | Admitting: Oncology

## 2019-08-07 ENCOUNTER — Other Ambulatory Visit: Payer: Self-pay

## 2019-08-07 ENCOUNTER — Encounter (HOSPITAL_COMMUNITY): Payer: Self-pay | Admitting: Oncology

## 2019-08-07 ENCOUNTER — Inpatient Hospital Stay: Payer: BC Managed Care – PPO | Attending: Oncology

## 2019-08-07 DIAGNOSIS — C642 Malignant neoplasm of left kidney, except renal pelvis: Secondary | ICD-10-CM | POA: Diagnosis not present

## 2019-08-07 DIAGNOSIS — E1122 Type 2 diabetes mellitus with diabetic chronic kidney disease: Secondary | ICD-10-CM | POA: Diagnosis not present

## 2019-08-07 DIAGNOSIS — C7800 Secondary malignant neoplasm of unspecified lung: Secondary | ICD-10-CM | POA: Insufficient documentation

## 2019-08-07 DIAGNOSIS — N189 Chronic kidney disease, unspecified: Secondary | ICD-10-CM | POA: Insufficient documentation

## 2019-08-07 DIAGNOSIS — R188 Other ascites: Secondary | ICD-10-CM | POA: Diagnosis not present

## 2019-08-07 DIAGNOSIS — C649 Malignant neoplasm of unspecified kidney, except renal pelvis: Secondary | ICD-10-CM

## 2019-08-07 DIAGNOSIS — I129 Hypertensive chronic kidney disease with stage 1 through stage 4 chronic kidney disease, or unspecified chronic kidney disease: Secondary | ICD-10-CM | POA: Insufficient documentation

## 2019-08-07 DIAGNOSIS — R161 Splenomegaly, not elsewhere classified: Secondary | ICD-10-CM | POA: Diagnosis not present

## 2019-08-07 DIAGNOSIS — Z79899 Other long term (current) drug therapy: Secondary | ICD-10-CM | POA: Diagnosis not present

## 2019-08-07 DIAGNOSIS — E785 Hyperlipidemia, unspecified: Secondary | ICD-10-CM | POA: Insufficient documentation

## 2019-08-07 DIAGNOSIS — D649 Anemia, unspecified: Secondary | ICD-10-CM | POA: Diagnosis not present

## 2019-08-07 DIAGNOSIS — D6959 Other secondary thrombocytopenia: Secondary | ICD-10-CM | POA: Insufficient documentation

## 2019-08-07 DIAGNOSIS — Z7984 Long term (current) use of oral hypoglycemic drugs: Secondary | ICD-10-CM | POA: Insufficient documentation

## 2019-08-07 DIAGNOSIS — Z801 Family history of malignant neoplasm of trachea, bronchus and lung: Secondary | ICD-10-CM | POA: Diagnosis not present

## 2019-08-07 DIAGNOSIS — Z87891 Personal history of nicotine dependence: Secondary | ICD-10-CM | POA: Insufficient documentation

## 2019-08-07 DIAGNOSIS — Z66 Do not resuscitate: Secondary | ICD-10-CM | POA: Insufficient documentation

## 2019-08-07 DIAGNOSIS — K7469 Other cirrhosis of liver: Secondary | ICD-10-CM | POA: Diagnosis not present

## 2019-08-07 DIAGNOSIS — Z5111 Encounter for antineoplastic chemotherapy: Secondary | ICD-10-CM | POA: Diagnosis not present

## 2019-08-07 DIAGNOSIS — G8929 Other chronic pain: Secondary | ICD-10-CM | POA: Insufficient documentation

## 2019-08-07 DIAGNOSIS — I2510 Atherosclerotic heart disease of native coronary artery without angina pectoris: Secondary | ICD-10-CM | POA: Diagnosis not present

## 2019-08-07 MED ORDER — PROCHLORPERAZINE MALEATE 10 MG PO TABS: 10.0000 mg | ORAL_TABLET | Freq: Four times a day (QID) | ORAL | 0 refills | Status: AC | PRN

## 2019-08-07 NOTE — Progress Notes (Signed)
[place]  Telephone:([phone] [phone]  Patient Care Team: Albina Billet, MD as PCP - General (Internal Medicine)   Name of the patient: Henry Moore  [id number]  04/04/1961   Date of visit: 08/07/19  Diagnosis- Kidney Cancer   Chief complaint/Reason for visit- Initial Meeting for [facility], preparing for starting chemotherapy  Heme/Onc history:  Oncology History  Renal cell carcinoma ([place])  08/04/2019 Initial Diagnosis   Renal cell carcinoma ([place])   08/04/2019 -  Chemotherapy   The patient had ipilimumab (YERVOY) 110 mg in sodium chloride 0.9 % 50 mL chemo infusion, 1 mg/kg, Intravenous,  Once, 0 of 4 cycles nivolumab (OPDIVO) 334 mg in sodium chloride 0.9 % 100 mL chemo infusion, 3 mg/kg, Intravenous, Once, 0 of 5 cycles  for chemotherapy treatment.      Interval history-[name] is a 59 year old male who presents to chemo care clinic today for initial meeting in preparation for starting chemotherapy. I introduced the chemo care clinic and we discussed that the role of the clinic is to assist those who are at an increased risk of emergency room visits and/or complications during the course of chemotherapy treatment. We discussed that the increased risk takes into account factors such as age, performance status, and co-morbidities. We also discussed that for some, this might include barriers to care such as not having a primary care provider, lack of insurance/transportation, or not being able to afford medications. We discussed that the goal of the program is to help prevent unplanned ER visits and help reduce complications during chemotherapy. We do this by discussing specific risk factors to each individual and identifying ways that we can help improve these risk factors and reduce barriers to care.   ECOG FS:0 - Asymptomatic  Review of systems- Review of Systems  Constitutional: Negative.  Negative for  chills, fever, malaise/fatigue and weight loss.  HENT: Negative for congestion, ear pain and tinnitus.   Eyes: Negative.  Negative for blurred vision and double vision.  Respiratory: Negative.  Negative for cough, sputum production and shortness of breath.   Cardiovascular: Negative.  Negative for chest pain, palpitations and leg swelling.  Gastrointestinal: Negative.  Negative for abdominal pain, constipation, diarrhea, nausea and vomiting.  Genitourinary: Negative for dysuria, frequency and urgency.  Musculoskeletal: Negative for back pain and falls.  Skin: Negative.  Negative for rash.  Neurological: Negative.  Negative for weakness and headaches.  Endo/Heme/Allergies: Negative.  Does not bruise/bleed easily.  Psychiatric/Behavioral: Negative.  Negative for depression. The patient is not nervous/anxious and does not have insomnia.     Current treatment- Yervoy q 21 and Opdivo q 21 and q 28 days and Inlyta.  Allergies  Allergen Reactions  . Codeine Itching  . Mucinex [Guaifenesin Er] Anxiety    anxiety    Past Medical History:  Diagnosis Date  . Cough    SINUS DRAINAGE CAUSING COUGHING , REPORTS CLEAR MUCOUS   . Diabetes mellitus without complication ([place])   . HTN (hypertension)   . Hyperlipemia   . Left renal mass   . Platelets decreased ([place])    DENIES UNSUAL BLEEDING   . PONV (postoperative nausea and vomiting)   . Renal cell carcinoma ([place]) 08/04/2019  . WBC decreased     Past Surgical History:  Procedure Laterality Date  . ANKLE SURGERY Left   . ANTERIOR CERVICAL DECOMP/DISCECTOMY FUSION N/A 04/16/2014   Procedure: ANTERIOR CERVICAL DECOMPRESSION/DISCECTOMY FUSION  (ACDF C5-C7)   (  2 LEVELS)     ;  Surgeon: Melina Schools, MD;  Location: [facility];  Service: Orthopedics;  Laterality: N/A;  . APPENDECTOMY    . BACK SURGERY    . ESOPHAGOGASTRODUODENOSCOPY (EGD) WITH PROPOFOL N/A 02/06/2019   Procedure: ESOPHAGOGASTRODUODENOSCOPY (EGD) WITH PROPOFOL;  Surgeon: [name], MD;   Location: [facility] ENDOSCOPY;  Service: Gastroenterology;  Laterality: N/A;  . ESOPHAGOGASTRODUODENOSCOPY (EGD) WITH PROPOFOL N/A 03/04/2019   Procedure: ESOPHAGOGASTRODUODENOSCOPY (EGD) WITH PROPOFOL;  Surgeon: Lin Landsman, MD;  Location: [facility] ENDOSCOPY;  Service: Gastroenterology;  Laterality: N/A;  . ESOPHAGOGASTRODUODENOSCOPY (EGD) WITH PROPOFOL N/A 04/22/2019   Procedure: ESOPHAGOGASTRODUODENOSCOPY (EGD) WITH PROPOFOL;  Surgeon: [name], MD;  Location: [facility] ENDOSCOPY;  Service: Gastroenterology;  Laterality: N/A;  . FRACTURE SURGERY    . HYDROCELE EXCISION    . KNEE ARTHROSCOPY Bilateral   . LAPAROSCOPIC NEPHRECTOMY, HAND ASSISTED Left 10/16/2018   Procedure: LEFT HAND ASSISTED LAPAROSCOPIC NEPHRECTOMY;  Surgeon: Lucas Mallow, MD;  Location: [facility] ORS;  Service: Urology;  Laterality: Left;  . NASAL SINUS SURGERY     X 2  . 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 SHOULDER SURGERY Left     Social History   Socioeconomic History  . Marital status: Married    Spouse name: Not on file  . Number of children: Not on file  . Years of education: Not on file  . Highest education level: Not on file  Occupational History  . Not on file  Tobacco Use  . Smoking status: Former Smoker    Packs/day: 0.25    Years: 20.00    Pack years: 5.00    Quit date: 2015    Years since quitting: 6.0  . Smokeless tobacco: Never Used  Substance and Sexual Activity  . Alcohol use: Not Currently    Alcohol/week: 0.0 standard drinks    Comment: no alchol in 1 year  . Drug use: No  . Sexual activity: Not on file  Other Topics Concern  . Not on file  Social History Narrative  . Not on file   Social Determinants of Health   Financial Resource Strain:   . Difficulty of Paying Living Expenses: Not on file  Food Insecurity:   . Worried About Charity fundraiser in the Last Year: Not on file  . Ran Out of Food in the Last Year: Not on file  Transportation Needs:   . Lack of Transportation  (Medical): Not on file  . Lack of Transportation (Non-Medical): Not on file  Physical Activity:   . Days of Exercise per Week: Not on file  . Minutes of Exercise per Session: Not on file  Stress:   . Feeling of Stress : Not on file  Social Connections:   . Frequency of Communication with Friends and Family: Not on file  . Frequency of Social Gatherings with Friends and Family: Not on file  . Attends Religious Services: Not on file  . Active Member of Clubs or Organizations: Not on file  . Attends Archivist Meetings: Not on file  . Marital Status: Not on file  Intimate Partner Violence:   . Fear of Current or Ex-Partner: Not on file  . Emotionally Abused: Not on file  . Physically Abused: Not on file  . Sexually Abused: Not on file    Family History  Problem Relation Age of Onset  . Diabetes Mother   . Cancer Mother        neuroendocrine cancer  .  Cancer Father        brain  . Cancer Maternal Grandmother        stomcah  . Alzheimer's disease Paternal Grandmother   . Lung cancer Paternal Grandfather      Current Outpatient Medications:  .  ACCU-CHEK AVIVA PLUS test strip, CHECK BL00D SUGAR ONCE OR TWICE DAILY. (Patient taking differently: 1 each by Other route as needed. ), Disp: 100 each, Rfl: PRN .  amLODipine (NORVASC) 10 MG tablet, Take 10 mg by mouth daily., Disp: , Rfl:  .  carvedilol (COREG) 25 MG tablet, Take 1 tablet (25 mg total) by mouth 2 (two) times daily with a meal., Disp: 180 tablet, Rfl: 3 .  glipiZIDE (GLUCOTROL) 5 MG tablet, Take 1 tablet (5 mg total) by mouth daily before breakfast., Disp: 90 tablet, Rfl: 3 .  hydrochlorothiazide (HYDRODIURIL) 25 MG tablet, TAKE 1 TABLET DAILY., Disp: 90 tablet, Rfl: 0 .  losartan (COZAAR) 100 MG tablet, Take 1 tablet by mouth daily., Disp: 90 tablet, Rfl: 0 .  lovastatin (MEVACOR) 40 MG tablet, Take 1 tablet (40 mg total) by mouth at bedtime., Disp: 60 tablet, Rfl: 0 .  metFORMIN (GLUCOPHAGE-XR) 500 MG 24 hr  tablet, Take 500 mg by mouth daily with breakfast. , Disp: , Rfl:   Physical exam: There were no vitals filed for this visit. Physical Exam Constitutional:      Appearance: Normal appearance.  HENT:     Head: Normocephalic and atraumatic.  Eyes:     Pupils: Pupils are equal, round, and reactive to light.  Cardiovascular:     Rate and Rhythm: Normal rate and regular rhythm.     Heart sounds: Normal heart sounds. No murmur.  Pulmonary:     Effort: Pulmonary effort is normal.     Breath sounds: Normal breath sounds. No wheezing.  Abdominal:     General: Bowel sounds are normal. There is no distension.     Palpations: Abdomen is soft.     Tenderness: There is no abdominal tenderness.  Musculoskeletal:        General: Normal range of motion.     Cervical back: Normal range of motion.  Skin:    General: Skin is warm and dry.     Findings: No rash.  Neurological:     Mental Status: He is alert and oriented to person, place, and time.  Psychiatric:        Judgment: Judgment normal.      CMP Latest Ref Rng & Units 07/28/2019  Glucose 70 - 99 mg/dL 274(H)  BUN 6 - 20 mg/dL 21(H)  Creatinine 0.61 - 1.24 mg/dL 1.75(H)  Sodium 135 - 145 mmol/L 135  Potassium 3.5 - 5.1 mmol/L 4.5  Chloride 98 - 111 mmol/L 106  CO2 22 - 32 mmol/L 23  Calcium 8.9 - 10.3 mg/dL 9.1  Total Protein 6.5 - 8.1 g/dL 8.4(H)  Total Bilirubin 0.3 - 1.2 mg/dL 1.0  Alkaline Phos 38 - 126 U/L 124  AST 15 - 41 U/L 29  ALT 0 - 44 U/L 26   CBC Latest Ref Rng & Units 07/28/2019  WBC 4.0 - 10.5 K/uL 3.4(L)  Hemoglobin 13.0 - 17.0 g/dL 10.9(L)  Hematocrit 39.0 - 52.0 % 33.0(L)  Platelets 150 - 400 K/uL 75(L)    No images are attached to the encounter.  MR Brain W Wo Contrast  Result Date: 08/05/2019 CLINICAL DATA:  History of renal cell carcinoma.  Staging. EXAM: MRI HEAD WITHOUT AND WITH CONTRAST   TECHNIQUE: Multiplanar, multiecho pulse sequences of the brain and surrounding structures were obtained without  and with intravenous contrast. CONTRAST:  10mL GADAVIST GADOBUTROL 1 MMOL/ML IV SOLN COMPARISON:  None. FINDINGS: Brain: Remote microhemorrhage in the left frontal lobe. No underlying edema, mass, or abnormal-nonspecific in isolation. No infarct, hydrocephalus, collection, or abnormal intracranial enhancement. Vascular: Normal flow voids and vascular enhancements Skull and upper cervical spine: Normal marrow signal. Sinuses/Orbits: Mild mucosal thickening scattered in paranasal sinuses with frothy secretions in the right sphenoid sinus. There has been prior endoscopic sinus surgery. IMPRESSION: No evidence of metastatic disease. Electronically Signed   By: Jonathon  Watts M.D.   On: 08/05/2019 08:52   Assessment and plan- Patient is a 58 y.o. male who presents to Chemo Care Clinic for initial meeting in preparation for starting chemotherapy for the treatment of yervoy/Opdivo plus inlyta.    1. HPI: Patient had CT thoracic spine on 08/05/2018 revealing a left renal mass.  On 09/04/2018 had CT abdomen and pelvis revealing a 5 mm round enhancing solid mass in left kidney.  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  Margins were negative.  On 01/28/2019 patient had another CT chest abdomen pelvis with contrast revealing scattered tiny pulmonary bilateral nodules.  On 07/14/2019 patient had surveillance CT showing interval progression of pulmonary metastasis and a new enlarged right paratracheal lymph node worrisome for metastatic adenopathy.  Plan was to complete staging with a bone scan and MRI of the brain.   2. Chemo Care Clinic/High Risk for ER/Hospitalization during chemotherapy- We discussed the role of the chemo care clinic and identified patient specific risk factors. I discussed that patient was identified as high risk primarily based on: Stage of disease and co morbidities.  Patient has past medical history positive for: Past Medical History:  Diagnosis Date   . Cough    SINUS DRAINAGE CAUSING COUGHING , REPORTS CLEAR MUCOUS   . Diabetes mellitus without complication ([facility])   . HTN (hypertension)   . Hyperlipemia   . Left renal mass   . Platelets decreased ([facility])    DENIES UNSUAL BLEEDING   . PONV (postoperative nausea and vomiting)   . Renal cell carcinoma ([facility]) 08/04/2019  . WBC decreased     Patient has past surgical history positive for: Past Surgical History:  Procedure Laterality Date  . ANKLE SURGERY Left   . ANTERIOR CERVICAL DECOMP/DISCECTOMY FUSION N/A 04/16/2014   Procedure: ANTERIOR CERVICAL DECOMPRESSION/DISCECTOMY FUSION  (ACDF C5-C7)   (2 LEVELS)     ;  Surgeon: Dahari Brooks, MD;  Location: [facility];  Service: Orthopedics;  Laterality: N/A;  . APPENDECTOMY    . BACK SURGERY    . ESOPHAGOGASTRODUODENOSCOPY (EGD) WITH PROPOFOL N/A 02/06/2019   Procedure: ESOPHAGOGASTRODUODENOSCOPY (EGD) WITH PROPOFOL;  Surgeon: Anna, Kiran, MD;  Location: [facility] ENDOSCOPY;  Service: Gastroenterology;  Laterality: N/A;  . ESOPHAGOGASTRODUODENOSCOPY (EGD) WITH PROPOFOL N/A 03/04/2019   Procedure: ESOPHAGOGASTRODUODENOSCOPY (EGD) WITH PROPOFOL;  Surgeon: Vanga, Rohini Reddy, MD;  Location: [facility] ENDOSCOPY;  Service: Gastroenterology;  Laterality: N/A;  . ESOPHAGOGASTRODUODENOSCOPY (EGD) WITH PROPOFOL N/A 04/22/2019   Procedure: ESOPHAGOGASTRODUODENOSCOPY (EGD) WITH PROPOFOL;  Surgeon: Anna, Kiran, MD;  Location: [facility] ENDOSCOPY;  Service: Gastroenterology;  Laterality: N/A;  . FRACTURE SURGERY    . HYDROCELE EXCISION    . KNEE ARTHROSCOPY Bilateral   . LAPAROSCOPIC NEPHRECTOMY, HAND ASSISTED Left 10/16/2018   Procedure: LEFT HAND ASSISTED LAPAROSCOPIC NEPHRECTOMY;  Surgeon: Bell, Eugene D III, MD;  Location: [facility];    Service: Urology;  Laterality: Left;  . NASAL SINUS SURGERY     X 2  . 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 SHOULDER SURGERY Left     Based on our high risk symptom management report; this patient has a high risk of ED utilization.  The  percentage below indicates how "at risk "  this patient based on the factors in this table within one year.   10% Low Risk Risk of Admission or ED Visit  This score indicates an adult patient's 1-year risk, as a percentage, of a hospital admission or ED visit.    0%  10%   5 months ago 3 months ago 8 weeks ago Today    5% 02/07/19 2000   7% 04/22/19 0724   10% 07/28/19 1106   10% 08/07/19 1308    Factor Value  Current PCP Denny C Tate, MD  Hospital Admissions 5   ED Visits 0  Has Medicaid No  Has Medicare No  In relationship Yes  Has Anemia Yes  Has asthma No  Has atrial fibrillation No  Has CVD No  Has chronic kidney disease Yes  Has Chronic Obstructive Pulmonary Disease No  Has Congestive Heart Failure No  Has Connective Tissue Disorder No  Has Depression No  Has Diabetes Yes  Has liver disease Yes  Has Peripheral Vascular Disease No    3. We discussed that social determinants of health may have significant impacts on health and outcomes for cancer patients.  Today we discussed specific social determinants of performance status, alcohol use, depression, financial needs, food insecurity, housing, interpersonal violence, social connections, stress, tobacco use, and transportation.    After lengthy discussion the following were identified as areas of need: None at this time  We discussed options including home based and outpatient services, DME, and CARE program. We discusssed that patients who participate in regular physical activity report fewer negative impacts of cancer and treatments and report less fatigue.  We discussed self-referral to [facility] for counseling services, psychiatry for medication management, or palliative care/symptom management as well as primary care providers.  We discussed that living with cancer can create tremendous financial burden.  We discussed options for assistance. I asked that if assistance is needed in affording medications or paying bills to  please let us know so that we can provide assistance.  We discussed options for food including social services.  We will also notify [facility] to see if cancer center can provide support.  Patient informed of food pantry at cancer center and was provided with care package today.  Please notify nursing if un-met needs. We discussed referral to social work. Will also discuss with [facility] to see if [facility] can provide support of utility bills, etc.  We discussed options for support groups at the cancer center. If interested, please notify nurse navigator to enroll.  We discussed options for managing stress including healthy eating, exercise as well as participating in no charge counseling services at the cancer center and support groups.  If these are of interest, patient can notify either myself or primary nursing team. We discussed options for management including medications and referral to quit Smart program We discussed options for transportation including [employer], paratransit, bus routes, link transit, taxi/uber/lyft, and cancer center van.  I have notified primary oncology team who will help assist with arranging van transportation for appointments when needed. We also discussed options for transportation on short notice/acute visits.     5. Based on stage of cancer and/or identified needs today, I will refer patient to palliative care for goals of care and advanced care planning.  We also discussed the role of the Symptom Management Clinic at [facility] for acute issues and methods of contacting clinic/provider. He denies needing specific assistance at this time.   Visit Diagnosis 1. Renal cell carcinoma, unspecified laterality ([place])     Patient expressed understanding and was in agreement with this plan. He also understands that He can call clinic at any time with any questions, concerns, or complaints.   A total of (25) minutes of face-to-face time was spent with this patient with greater  than 50% of that time in counseling and care-coordination.  [facility]  CC: Dr. Tasia Catchings

## 2019-08-08 ENCOUNTER — Encounter: Payer: Self-pay | Admitting: Oncology

## 2019-08-11 ENCOUNTER — Other Ambulatory Visit: Payer: Self-pay

## 2019-08-11 ENCOUNTER — Encounter
Admission: RE | Admit: 2019-08-11 | Discharge: 2019-08-11 | Disposition: A | Discharge status: Home / Self Care | Payer: BC Managed Care – PPO | Source: Ambulatory Visit | Attending: Oncology | Admitting: Oncology

## 2019-08-11 DIAGNOSIS — C649 Malignant neoplasm of unspecified kidney, except renal pelvis: Secondary | ICD-10-CM

## 2019-08-11 MED ORDER — TECHNETIUM TC 99M MEDRONATE IV KIT
20.0000 | PACK | Freq: Once | INTRAVENOUS | Status: AC | PRN
  Administered 2019-08-11: 23 via INTRAVENOUS

## 2019-08-13 ENCOUNTER — Other Ambulatory Visit: Payer: Self-pay | Admitting: Oncology

## 2019-08-13 ENCOUNTER — Other Ambulatory Visit: Payer: Self-pay

## 2019-08-13 ENCOUNTER — Ambulatory Visit: Payer: BC Managed Care – PPO | Attending: Internal Medicine

## 2019-08-13 DIAGNOSIS — Z20822 Contact with and (suspected) exposure to covid-19: Secondary | ICD-10-CM

## 2019-08-13 DIAGNOSIS — C649 Malignant neoplasm of unspecified kidney, except renal pelvis: Secondary | ICD-10-CM

## 2019-08-14 ENCOUNTER — Inpatient Hospital Stay: Payer: BC Managed Care – PPO

## 2019-08-14 ENCOUNTER — Other Ambulatory Visit: Payer: Self-pay

## 2019-08-14 ENCOUNTER — Encounter: Payer: Self-pay | Admitting: Oncology

## 2019-08-14 ENCOUNTER — Inpatient Hospital Stay (HOSPITAL_BASED_OUTPATIENT_CLINIC_OR_DEPARTMENT_OTHER): Payer: BC Managed Care – PPO | Admitting: Oncology

## 2019-08-14 VITALS — BP 118/60 | HR 66 | Temp 96.8°F | Resp 18

## 2019-08-14 VITALS — BP 125/78 | HR 74 | Temp 96.0°F | Resp 18 | Wt 248.1 lb

## 2019-08-14 DIAGNOSIS — C7800 Secondary malignant neoplasm of unspecified lung: Secondary | ICD-10-CM

## 2019-08-14 DIAGNOSIS — C649 Malignant neoplasm of unspecified kidney, except renal pelvis: Secondary | ICD-10-CM

## 2019-08-14 DIAGNOSIS — D696 Thrombocytopenia, unspecified: Secondary | ICD-10-CM

## 2019-08-14 DIAGNOSIS — Z7189 Other specified counseling: Secondary | ICD-10-CM

## 2019-08-14 DIAGNOSIS — K7460 Unspecified cirrhosis of liver: Secondary | ICD-10-CM

## 2019-08-14 DIAGNOSIS — C642 Malignant neoplasm of left kidney, except renal pelvis: Secondary | ICD-10-CM | POA: Diagnosis not present

## 2019-08-14 DIAGNOSIS — R188 Other ascites: Secondary | ICD-10-CM

## 2019-08-14 DIAGNOSIS — D649 Anemia, unspecified: Secondary | ICD-10-CM

## 2019-08-14 DIAGNOSIS — Z5112 Encounter for antineoplastic immunotherapy: Secondary | ICD-10-CM

## 2019-08-14 DIAGNOSIS — R161 Splenomegaly, not elsewhere classified: Secondary | ICD-10-CM

## 2019-08-14 LAB — COMPREHENSIVE METABOLIC PANEL
ALT: 31 U/L (ref 0–44)
AST: 35 U/L (ref 15–41)
Albumin: 4.2 g/dL (ref 3.5–5.0)
Alkaline Phosphatase: 128 U/L — ABNORMAL HIGH (ref 38–126)
Anion gap: 9 (ref 5–15)
BUN: 29 mg/dL — ABNORMAL HIGH (ref 6–20)
CO2: 20 mmol/L — ABNORMAL LOW (ref 22–32)
Calcium: 8.7 mg/dL — ABNORMAL LOW (ref 8.9–10.3)
Chloride: 106 mmol/L (ref 98–111)
Creatinine, Ser: 1.87 mg/dL — ABNORMAL HIGH (ref 0.61–1.24)
GFR calc Af Amer: 46 mL/min — ABNORMAL LOW (ref >60)
GFR calc non Af Amer: 39 mL/min — ABNORMAL LOW (ref >60)
Glucose, Bld: 220 mg/dL — ABNORMAL HIGH (ref 70–99)
Potassium: 4.6 mmol/L (ref 3.5–5.1)
Sodium: 135 mmol/L (ref 135–145)
Total Bilirubin: 1 mg/dL (ref 0.3–1.2)
Total Protein: 7.9 g/dL (ref 6.5–8.1)

## 2019-08-14 LAB — CBC WITH DIFFERENTIAL/PLATELET
Abs Immature Granulocytes: 0.01 10*3/uL (ref 0.00–0.07)
Basophils Absolute: 0 10*3/uL (ref 0.0–0.1)
Basophils Relative: 1 %
Eosinophils Absolute: 0.1 10*3/uL (ref 0.0–0.5)
Eosinophils Relative: 4 %
HCT: 30.9 % — ABNORMAL LOW (ref 39.0–52.0)
Hemoglobin: 10.2 g/dL — ABNORMAL LOW (ref 13.0–17.0)
Immature Granulocytes: 0 %
Lymphocytes Relative: 15 %
Lymphs Abs: 0.4 10*3/uL — ABNORMAL LOW (ref 0.7–4.0)
MCH: 30.2 pg (ref 26.0–34.0)
MCHC: 33 g/dL (ref 30.0–36.0)
MCV: 91.4 fL (ref 80.0–100.0)
Monocytes Absolute: 0.2 10*3/uL (ref 0.1–1.0)
Monocytes Relative: 9 %
Neutro Abs: 1.9 10*3/uL (ref 1.7–7.7)
Neutrophils Relative %: 71 %
Platelets: 65 10*3/uL — ABNORMAL LOW (ref 150–400)
RBC: 3.38 MIL/uL — ABNORMAL LOW (ref 4.22–5.81)
RDW: 12.9 % (ref 11.5–15.5)
WBC: 2.7 10*3/uL — ABNORMAL LOW (ref 4.0–10.5)
nRBC: 0 % (ref 0.0–0.2)

## 2019-08-14 LAB — TSH: TSH: 4.771 u[IU]/mL — ABNORMAL HIGH (ref 0.350–4.500)

## 2019-08-14 MED ORDER — SODIUM CHLORIDE 0.9 % IV SOLN
Freq: Once | INTRAVENOUS | Status: AC
  Filled 2019-08-14: qty 250

## 2019-08-14 MED ORDER — SODIUM CHLORIDE 0.9 % IV SOLN
0.9000 mg/kg | Freq: Once | INTRAVENOUS | Status: AC
  Administered 2019-08-14: 11:00:00 100 mg via INTRAVENOUS
  Filled 2019-08-14: qty 20

## 2019-08-14 MED ORDER — SODIUM CHLORIDE 0.9 % IV SOLN
340.0000 mg | Freq: Once | INTRAVENOUS | Status: AC
  Administered 2019-08-14: 10:00:00 340 mg via INTRAVENOUS
  Filled 2019-08-14: qty 24

## 2019-08-14 MED ORDER — DIPHENHYDRAMINE HCL 50 MG/ML IJ SOLN
25.0000 mg | Freq: Once | INTRAMUSCULAR | Status: AC
  Administered 2019-08-14: 10:00:00 25 mg via INTRAVENOUS
  Filled 2019-08-14: qty 1

## 2019-08-14 MED ORDER — FAMOTIDINE IN NACL 20-0.9 MG/50ML-% IV SOLN
20.0000 mg | Freq: Once | INTRAVENOUS | Status: AC
  Administered 2019-08-14: 20 mg via INTRAVENOUS
  Filled 2019-08-14: qty 50

## 2019-08-14 NOTE — Progress Notes (Signed)
Pt seen MD in clinic, Per Benjamine Mola RN per Dr. Tasia Catchings labs reviewed and okay proceed to with scheduled Nivolumab/Yervoy treatment.   1131: pt tolerated infusion well. Pt denies any concerns, questions or complaints at this time. No s/s of distress or reaction to given medication noted at this time. Pt educated to call 91/report to ER in the event of an emergerency or to call clinic for any questions or concerns. Pt verbalizes understanding. Pt and VS stable at time of discharge.

## 2019-08-14 NOTE — Progress Notes (Signed)
Hematology/Oncology follow-up [facility] [phone] [phone]   Patient Care Team: Albina Billet, MD as PCP - General (Internal Medicine)  REFERRING PROVIDER: Albina Billet, MD  CHIEF COMPLAINTS/REASON FOR VISIT:  Follow-up for kidney cancer  HISTORY OF PRESENTING ILLNESS:   Henry Moore is a  59 y.o.  male with PMH listed below was seen in consultation at the request of  Albina Billet, MD  for evaluation of kidney cancer Extensive medical records were reviewed Patient had MRI lumbar spine without contrast done on 07/13/2018 which showed mired lumbar degenerative disease. CT thoracic spine was done on 08/05/2018 which showed 5 cm left renal mass. 09/04/2018 CT abdomen and pelvis with and without contrast showed 5 cm round enhancing solid mass in the left kidney.  No involvement of left renal vein.  No lymphadenopathy Morphologic changes consistent with cirrhosis and the portal hypertension.  Small volume ascites and splenomegaly. . 10/16/2018 Left nephrectomy showed renal cell carcinoma, clear-cell type, nuclear grade 4, tumor extends into the renal vein and renal sinus fat.  Urethral, vascular and or resection margins are negative for tumor pT3a Nx Mx  01/28/2019 patient had another CT chest abdomen pelvis done with contrast Showed interval left nephrectomy.  Scattered tiny pulmonary nodules bilaterally.  Nonspecific.  No other evidence of metastatic disease.  Cirrhosis changes extensive coronary and aortic atherosclerosis  07/14/2019 patient underwent surveillance CT chest abdomen pelvis without contrast Interval progression of pulmonary metastasis with new enlarged right paratracheal lymph node 1.7 cm, previously 0.6 cm one-point since worrisome for metastatic adenopathy.  Multiple progressive pulmonary nodules, index nodule within the lingula measures 1.3 cm, previously 3 mm. Index subpleural nodule within the anterior right upper lobe  measuring 1.8 cm, previously 3 cm Index nodule within the post lateral right lower lobe measuring 5 mm, new from previous care Aortic sclerosis.  Three-vessel coronary artery calcification noted.  Cirrhosis changes.  Splenomegaly.  Patient was referred to establish care with medical oncology for further discussion and evaluation of metastatic renal cell carcinoma. Patient reports feeling well at baseline today.  Denies any shortness of breath, cough, hemoptysis, back pain, headache. He quitted smoking 2015.  Not currently actively drinking alcohol as well. Patient has chronic back pain unchanged.   # Liver cirrhosis with small amount of ascites/splenomegaly patient sees gastroenterology Dr. [name].  . He will get ultrasound surveillance due in February 2021 for screening of [place].  EGD surveillance for Barrett's plan in April 2021.  INTERVAL HISTORY Henry Moore is a 59 y.o. male who has above history reviewed by me today presents for follow up visit for management of stage IV RCC Problems and complaints are listed below:; During the interval, patient has had MRI brain and bone scan done.  Both studies were negative. He has been to chemotherapy class. Today he reports having no new complaints.  He is here by himself.  Review of Systems  Constitutional: Positive for fatigue. Negative for appetite change, chills, fever and unexpected weight change.  HENT:   Negative for hearing loss and voice change.   Eyes: Negative for eye problems and icterus.  Respiratory: Negative for chest tightness, cough and shortness of breath.   Cardiovascular: Negative for chest pain and leg swelling.  Gastrointestinal: Negative for abdominal distention and abdominal pain.  Endocrine: Negative for hot flashes.  Genitourinary: Negative for difficulty urinating, dysuria and frequency.   Musculoskeletal: Positive for back pain. Negative for arthralgias.  Skin: Negative for itching and  rash.  Neurological:  Negative for light-headedness and numbness.  Hematological: Negative for adenopathy. Does not bruise/bleed easily.  Psychiatric/Behavioral: Negative for confusion.    MEDICAL HISTORY:  Past Medical History:  Diagnosis Date  . Cough    SINUS DRAINAGE CAUSING COUGHING , REPORTS CLEAR MUCOUS   . Diabetes mellitus without complication ([place])   . HTN (hypertension)   . Hyperlipemia   . Left renal mass   . Platelets decreased ([place])    DENIES UNSUAL BLEEDING   . PONV (postoperative nausea and vomiting)   . Renal cell carcinoma ([place]) 08/04/2019  . WBC decreased     SURGICAL HISTORY: Past Surgical History:  Procedure Laterality Date  . ANKLE SURGERY Left   . ANTERIOR CERVICAL DECOMP/DISCECTOMY FUSION N/A 04/16/2014   Procedure: ANTERIOR CERVICAL DECOMPRESSION/DISCECTOMY FUSION  (ACDF C5-C7)   (2 LEVELS)     ;  Surgeon: Melina Schools, MD;  Location: [place];  Service: Orthopedics;  Laterality: N/A;  . APPENDECTOMY    . BACK SURGERY    . ESOPHAGOGASTRODUODENOSCOPY (EGD) WITH PROPOFOL N/A 02/06/2019   Procedure: ESOPHAGOGASTRODUODENOSCOPY (EGD) WITH PROPOFOL;  Surgeon: [name], MD;  Location: [facility] ENDOSCOPY;  Service: Gastroenterology;  Laterality: N/A;  . ESOPHAGOGASTRODUODENOSCOPY (EGD) WITH PROPOFOL N/A 03/04/2019   Procedure: ESOPHAGOGASTRODUODENOSCOPY (EGD) WITH PROPOFOL;  Surgeon: Lin Landsman, MD;  Location: [facility] ENDOSCOPY;  Service: Gastroenterology;  Laterality: N/A;  . ESOPHAGOGASTRODUODENOSCOPY (EGD) WITH PROPOFOL N/A 04/22/2019   Procedure: ESOPHAGOGASTRODUODENOSCOPY (EGD) WITH PROPOFOL;  Surgeon: [name], MD;  Location: [facility] ENDOSCOPY;  Service: Gastroenterology;  Laterality: N/A;  . FRACTURE SURGERY    . HYDROCELE EXCISION    . KNEE ARTHROSCOPY Bilateral   . LAPAROSCOPIC NEPHRECTOMY, HAND ASSISTED Left 10/16/2018   Procedure: LEFT HAND ASSISTED LAPAROSCOPIC NEPHRECTOMY;  Surgeon: Lucas Mallow, MD;  Location: [facility];  Service: Urology;  Laterality: Left;  . NASAL  SINUS SURGERY     X 2  . 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 SHOULDER SURGERY Left     SOCIAL HISTORY: Social History   Socioeconomic History  . Marital status: Married    Spouse name: Not on file  . Number of children: Not on file  . Years of education: Not on file  . Highest education level: Not on file  Occupational History  . Not on file  Tobacco Use  . Smoking status: Former Smoker    Packs/day: 0.25    Years: 20.00    Pack years: 5.00    Quit date: 2015    Years since quitting: 6.0  . Smokeless tobacco: Never Used  Substance and Sexual Activity  . Alcohol use: Not Currently    Alcohol/week: 0.0 standard drinks    Comment: no alchol in 1 year  . Drug use: No  . Sexual activity: Not on file  Other Topics Concern  . Not on file  Social History Narrative  . Not on file   Social Determinants of Health   Financial Resource Strain:   . Difficulty of Paying Living Expenses: Not on file  Food Insecurity:   . Worried About Charity fundraiser in the Last Year: Not on file  . Ran Out of Food in the Last Year: Not on file  Transportation Needs:   . Lack of Transportation (Medical): Not on file  . Lack of Transportation (Non-Medical): Not on file  Physical Activity:   . Days of Exercise per Week: Not on file  . Minutes of Exercise per Session: Not  on file  Stress:   . Feeling of Stress : Not on file  Social Connections:   . Frequency of Communication with Friends and Family: Not on file  . Frequency of Social Gatherings with Friends and Family: Not on file  . Attends Religious Services: Not on file  . Active Member of Clubs or Organizations: Not on file  . Attends Archivist Meetings: Not on file  . Marital Status: Not on file  Intimate Partner Violence:   . Fear of Current or Ex-Partner: Not on file  . Emotionally Abused: Not on file  . Physically Abused: Not on file  . Sexually Abused: Not on file    FAMILY HISTORY: Family History    Problem Relation Age of Onset  . Diabetes Mother   . Cancer Mother        neuroendocrine cancer  . Cancer Father        brain  . Cancer Maternal Grandmother        stomcah  . Alzheimer's disease Paternal Grandmother   . Lung cancer Paternal Grandfather     ALLERGIES:  is allergic to codeine and mucinex [guaifenesin er].  MEDICATIONS:  Current Outpatient Medications  Medication Sig Dispense Refill  . ACCU-CHEK AVIVA PLUS test strip CHECK BL00D SUGAR ONCE OR TWICE DAILY. (Patient taking differently: 1 each by Other route as needed. ) 100 each PRN  . amLODipine (NORVASC) 10 MG tablet Take 10 mg by mouth daily.    . carvedilol (COREG) 25 MG tablet Take 1 tablet (25 mg total) by mouth 2 (two) times daily with a meal. 180 tablet 3  . glipiZIDE (GLUCOTROL) 5 MG tablet Take 1 tablet (5 mg total) by mouth daily before breakfast. 90 tablet 3  . hydrochlorothiazide (HYDRODIURIL) 25 MG tablet TAKE 1 TABLET DAILY. 90 tablet 0  . losartan (COZAAR) 100 MG tablet Take 1 tablet by mouth daily. 90 tablet 0  . lovastatin (MEVACOR) 40 MG tablet Take 1 tablet (40 mg total) by mouth at bedtime. 60 tablet 0  . metFORMIN (GLUCOPHAGE-XR) 500 MG 24 hr tablet Take 500 mg by mouth daily with breakfast.     . prochlorperazine (COMPAZINE) 10 MG tablet Take 1 tablet (10 mg total) by mouth every 6 (six) hours as needed for nausea or vomiting. 30 tablet 0   No current facility-administered medications for this visit.     PHYSICAL EXAMINATION: ECOG PERFORMANCE STATUS: 1 - Symptomatic but completely ambulatory Vitals:   08/14/19 0829  BP: 125/78  Pulse: 74  Resp: 18  Temp: (!) 96 F (35.6 C)   Filed Weights   08/14/19 0829  Weight: 248 lb 1.6 oz (112.5 kg)    Physical Exam Constitutional:      General: He is not in acute distress.    Appearance: He is obese.  HENT:     Head: Normocephalic and atraumatic.  Eyes:     General: No scleral icterus.    Pupils: Pupils are equal, round, and reactive to  light.  Cardiovascular:     Rate and Rhythm: Normal rate and regular rhythm.     Heart sounds: Normal heart sounds.  Pulmonary:     Effort: Pulmonary effort is normal. No respiratory distress.     Breath sounds: No wheezing.  Abdominal:     General: Bowel sounds are normal. There is no distension.     Palpations: Abdomen is soft. There is no mass.     Tenderness: There is no abdominal tenderness.  Musculoskeletal:        General: No deformity. Normal range of motion.     Cervical back: Normal range of motion and neck supple.  Skin:    General: Skin is warm and dry.     Findings: No erythema or rash.  Neurological:     Mental Status: He is alert and oriented to person, place, and time.     Cranial Nerves: No cranial nerve deficit.     Coordination: Coordination normal.  Psychiatric:        Mood and Affect: Mood normal.        Behavior: Behavior normal.        Thought Content: Thought content normal.     LABORATORY DATA:  I have reviewed the data as listed Lab Results  Component Value Date   WBC 2.7 (L) 08/14/2019   HGB 10.2 (L) 08/14/2019   HCT 30.9 (L) 08/14/2019   MCV 91.4 08/14/2019   PLT 65 (L) 08/14/2019   Recent Labs    10/15/18 1325 10/15/18 1325 10/16/18 1038 10/21/18 0510 05/01/19 1413 05/12/19 1121 07/28/19 1208 08/14/19 0809  NA 134   < >   < > 135 138  --  135 135  K 4.4   < >   < > 3.6 5.1  --  4.5 4.6  CL 103   < >   < > 106 105  --  106 106  CO2 20  --    < > 20*  --   --  23 20*  GLUCOSE 201*   < >   < > 126* 234*  --  274* 220*  BUN 22   < >   < > 28* 28*  --  21* 29*  CREATININE 1.41*   < >   < > 1.64* 2.01*  --  1.75* 1.87*  CALCIUM 9.2   < >   < > 8.1* 9.1 8.9 9.1 8.7*  GFRNONAA 56*   < >   < > 46* 36*  --  43* 39*  GFRAA 64   < >   < > 54* 42*  --  49* 46*  PROT 7.7   < >  --   --  7.8  --  8.4* 7.9  ALBUMIN 4.3   < >  --   --  4.4  --  4.2 4.2  AST 31   < >  --   --  26  --  29 35  ALT 24  --   --   --   --   --  26 31  ALKPHOS 135*    < >  --   --  162*  --  124 128*  BILITOT 0.8   < >  --   --  0.7  --  1.0 1.0   < > = values in this interval not displayed.   Iron/TIBC/Ferritin/ %Sat    Component Value Date/Time   IRON 105 10/15/2018 1325   TIBC 244 (L) 10/15/2018 1325   FERRITIN 588 (H) 10/15/2018 1325   IRONPCTSAT 43 10/15/2018 1325      RADIOGRAPHIC STUDIES: I have personally reviewed the radiological images as listed and agreed with the findings in the report.  MR Brain W Wo Contrast  Result Date: 08/05/2019 CLINICAL DATA:  History of renal cell carcinoma.  Staging. EXAM: MRI HEAD WITHOUT AND WITH CONTRAST TECHNIQUE: Multiplanar, multiecho pulse sequences of the brain and surrounding structures were obtained without and with  intravenous contrast. CONTRAST:  5mL GADAVIST GADOBUTROL 1 MMOL/ML IV SOLN COMPARISON:  None. FINDINGS: Brain: Remote microhemorrhage in the left frontal lobe. No underlying edema, mass, or abnormal-nonspecific in isolation. No infarct, hydrocephalus, collection, or abnormal intracranial enhancement. Vascular: Normal flow voids and vascular enhancements Skull and upper cervical spine: Normal marrow signal. Sinuses/Orbits: Mild mucosal thickening scattered in paranasal sinuses with frothy secretions in the right sphenoid sinus. There has been prior endoscopic sinus surgery. IMPRESSION: No evidence of metastatic disease. Electronically Signed   By: Monte Fantasia M.D.   On: 08/05/2019 08:52   NM Bone Scan Whole Body  Result Date: 08/11/2019 CLINICAL DATA:  Renal cell carcinoma. EXAM: NUCLEAR MEDICINE WHOLE BODY BONE SCAN TECHNIQUE: Whole body anterior and posterior images were obtained approximately 3 hours after intravenous injection of radiopharmaceutical. RADIOPHARMACEUTICALS:  23 mCi Technetium-85m MDP IV COMPARISON:  CT chest, abdomen, and pelvis 07/14/2019 FINDINGS: No abnormal foci of increased or decreased radiotracer uptake identified to suggest osseous metastasis. Normal physiologic  activity within the right kidney and bladder. Left kidney surgically absent. IMPRESSION: 1. No findings to suggest bone metastases. 2. Left nephrectomy. Electronically Signed   By: Kerby Moors M.D.   On: 08/11/2019 16:06      ASSESSMENT & PLAN:  1. Renal cell carcinoma, unspecified laterality ([place])   2. Goals of care, counseling/discussion   3. Malignant neoplasm metastatic to lung, unspecified laterality ([place])   4. Cirrhosis of liver with ascites, unspecified hepatic cirrhosis type ([place])   5. Thrombocytopenia ([place])   6. Splenomegaly   7. Normocytic anemia   8. 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 Patient's case was discussed on tumor board.  Radiographically lung lesions are considered to be metastasis from [place] and the consensus was reached to proceed with systemic treatment without lung biopsy. Patient also prefers to omit lung biopsy and proceed with treatment. Patient understands that his condition is not curable.  I discussed the mechanism of action and rationale of using immunotherapy.  The goal of therapy is palliative; and length of treatments are likely ongoing/based upon the results of the scans. Discussed the potential side effects of immunotherapy develop nivolumab and ipilimumab including but not limited to diarrhea; skin rash; respiratory failure, kidney failure, mental status change, elevated LFTs/liver failure,endocrine abnormalities, acute deterioration  and even death,etc. patient voices understanding and agrees with proceeding with treatment. I have also called insurance company and I did peer to peer review and his treatment was approved. Labs reviewed and discussed with patient.  Proceed with cycle 1 nivolumab and ipilimumab.  #Chronic  problems include cirrhosis of liver, bilirubin and transaminitis have been within normal limits.  Close monitor. He needs to continue follow-up with gastroenterology for endoscopy surveillance. #Splenomegaly due to cirrhosis, patient has chronic thrombocytopenia and her counts need to be watched closely. #CKD, avoid nephrotoxins. #Anemia, hemoglobin 10.2, multifactorial.  Monitor.  Check iron panel, B12, folate. Refer to palliative service to establish care. Follow-up 1 week lab MD for assessment of tolerability.  3 weeks for evaluation prior to the next cycle of ipilimumab and nivolumab.  Orders Placed This Encounter  Procedures  . TSH    Standing Status:   Standing    Number of Occurrences:   20    Standing Expiration Date:   08/13/2020  . CBC with  Differential    Standing Status:   Standing    Number of Occurrences:   20    Standing Expiration Date:   08/14/2020  . Comprehensive metabolic panel    Standing Status:   Standing    Number of Occurrences:   20    Standing Expiration Date:   08/14/2020  . Ambulatory Referral to Palliative Care    Referral Priority:   Routine    Referral Type:   Consultation    Number of Visits Requested:   1    All questions were answered. The patient knows to call the clinic with any problems questions or concerns.  cc Albina Billet, MD     Earlie Server, MD, PhD Hematology Oncology [facility] Pager- [phone] 08/14/2019

## 2019-08-14 NOTE — Progress Notes (Signed)
MD ok with labs today

## 2019-08-14 NOTE — Progress Notes (Signed)
Patient here for initial treatment of nivolumab/ ipilimumab. No concerns voiced. Patient is needing refill on amlodipine, carvedilol and glipizide and was unable to go to PCP due to having scans. Patient says he spoke to PCP office and they want his recent [place] faxed.

## 2019-08-15 ENCOUNTER — Telehealth: Payer: Self-pay

## 2019-08-15 LAB — NOVEL CORONAVIRUS, NAA: SARS-CoV-2, NAA: NOT DETECTED

## 2019-08-15 NOTE — Telephone Encounter (Signed)
Telephone call to patient for follow up after receiving first infusion yesterday.   Patient states he feels no different and can't even tell he received anything yesterday. States no side effects at all.  Eating good.  Drinking plenty of fluids.  Encouraged patient to call for any questions or concerns.

## 2019-08-21 ENCOUNTER — Encounter: Payer: Self-pay | Admitting: Oncology

## 2019-08-21 ENCOUNTER — Other Ambulatory Visit: Payer: Self-pay

## 2019-08-21 ENCOUNTER — Inpatient Hospital Stay: Payer: BC Managed Care – PPO

## 2019-08-21 ENCOUNTER — Inpatient Hospital Stay (HOSPITAL_BASED_OUTPATIENT_CLINIC_OR_DEPARTMENT_OTHER): Payer: BC Managed Care – PPO | Admitting: Hospice and Palliative Medicine

## 2019-08-21 ENCOUNTER — Inpatient Hospital Stay (HOSPITAL_BASED_OUTPATIENT_CLINIC_OR_DEPARTMENT_OTHER): Payer: BC Managed Care – PPO | Admitting: Oncology

## 2019-08-21 VITALS — BP 137/83 | HR 84 | Temp 98.2°F | Resp 16 | Wt 245.7 lb

## 2019-08-21 DIAGNOSIS — Z7189 Other specified counseling: Secondary | ICD-10-CM

## 2019-08-21 DIAGNOSIS — K7469 Other cirrhosis of liver: Secondary | ICD-10-CM

## 2019-08-21 DIAGNOSIS — N1832 Chronic kidney disease, stage 3b: Secondary | ICD-10-CM

## 2019-08-21 DIAGNOSIS — C649 Malignant neoplasm of unspecified kidney, except renal pelvis: Secondary | ICD-10-CM

## 2019-08-21 DIAGNOSIS — Z515 Encounter for palliative care: Secondary | ICD-10-CM

## 2019-08-21 DIAGNOSIS — D696 Thrombocytopenia, unspecified: Secondary | ICD-10-CM | POA: Insufficient documentation

## 2019-08-21 DIAGNOSIS — C642 Malignant neoplasm of left kidney, except renal pelvis: Secondary | ICD-10-CM | POA: Diagnosis not present

## 2019-08-21 LAB — CBC WITH DIFFERENTIAL/PLATELET
Abs Immature Granulocytes: 0.02 10*3/uL (ref 0.00–0.07)
Basophils Absolute: 0 10*3/uL (ref 0.0–0.1)
Basophils Relative: 1 %
Eosinophils Absolute: 0.2 10*3/uL (ref 0.0–0.5)
Eosinophils Relative: 6 %
HCT: 31.2 % — ABNORMAL LOW (ref 39.0–52.0)
Hemoglobin: 10.4 g/dL — ABNORMAL LOW (ref 13.0–17.0)
Immature Granulocytes: 1 %
Lymphocytes Relative: 15 %
Lymphs Abs: 0.5 10*3/uL — ABNORMAL LOW (ref 0.7–4.0)
MCH: 29.8 pg (ref 26.0–34.0)
MCHC: 33.3 g/dL (ref 30.0–36.0)
MCV: 89.4 fL (ref 80.0–100.0)
Monocytes Absolute: 0.4 10*3/uL (ref 0.1–1.0)
Monocytes Relative: 13 %
Neutro Abs: 2 10*3/uL (ref 1.7–7.7)
Neutrophils Relative %: 64 %
Platelets: 72 10*3/uL — ABNORMAL LOW (ref 150–400)
RBC: 3.49 MIL/uL — ABNORMAL LOW (ref 4.22–5.81)
RDW: 13 % (ref 11.5–15.5)
WBC: 3.1 10*3/uL — ABNORMAL LOW (ref 4.0–10.5)
nRBC: 0 % (ref 0.0–0.2)

## 2019-08-21 LAB — COMPREHENSIVE METABOLIC PANEL
ALT: 23 U/L (ref 0–44)
AST: 28 U/L (ref 15–41)
Albumin: 4.3 g/dL (ref 3.5–5.0)
Alkaline Phosphatase: 135 U/L — ABNORMAL HIGH (ref 38–126)
Anion gap: 10 (ref 5–15)
BUN: 32 mg/dL — ABNORMAL HIGH (ref 6–20)
CO2: 19 mmol/L — ABNORMAL LOW (ref 22–32)
Calcium: 9.1 mg/dL (ref 8.9–10.3)
Chloride: 105 mmol/L (ref 98–111)
Creatinine, Ser: 1.77 mg/dL — ABNORMAL HIGH (ref 0.61–1.24)
GFR calc Af Amer: 49 mL/min — ABNORMAL LOW (ref >60)
GFR calc non Af Amer: 42 mL/min — ABNORMAL LOW (ref >60)
Glucose, Bld: 180 mg/dL — ABNORMAL HIGH (ref 70–99)
Potassium: 4.3 mmol/L (ref 3.5–5.1)
Sodium: 134 mmol/L — ABNORMAL LOW (ref 135–145)
Total Bilirubin: 1 mg/dL (ref 0.3–1.2)
Total Protein: 8.2 g/dL — ABNORMAL HIGH (ref 6.5–8.1)

## 2019-08-21 LAB — T4, FREE: Free T4: 1.09 ng/dL (ref 0.61–1.12)

## 2019-08-21 LAB — IRON AND TIBC
Iron: 61 ug/dL (ref 45–182)
Saturation Ratios: 22 % (ref 17.9–39.5)
TIBC: 284 ug/dL (ref 250–450)
UIBC: 223 ug/dL

## 2019-08-21 LAB — FOLATE: Folate: 12 ng/mL (ref >5.9)

## 2019-08-21 LAB — VITAMIN B12: Vitamin B-12: 352 pg/mL (ref 180–914)

## 2019-08-21 LAB — FERRITIN: Ferritin: 237 ng/mL (ref 24–336)

## 2019-08-21 LAB — TSH: TSH: 3.461 u[IU]/mL (ref 0.350–4.500)

## 2019-08-21 NOTE — Progress Notes (Signed)
Hematology/Oncology follow-up [facility] [phone] [phone]   Patient Care Team: Albina Billet, MD as PCP - General (Internal Medicine)  REFERRING PROVIDER: Albina Billet, MD  CHIEF COMPLAINTS/REASON FOR VISIT:  Follow-up for kidney cancer  HISTORY OF PRESENTING ILLNESS:   Henry Moore is a  59 y.o.  male with PMH listed below was seen in consultation at the request of  Albina Billet, MD  for evaluation of kidney cancer Extensive medical records were reviewed Patient had MRI lumbar spine without contrast done on 07/13/2018 which showed mired lumbar degenerative disease. CT thoracic spine was done on 08/05/2018 which showed 5 cm left renal mass. 09/04/2018 CT abdomen and pelvis with and without contrast showed 5 cm round enhancing solid mass in the left kidney.  No involvement of left renal vein.  No lymphadenopathy Morphologic changes consistent with cirrhosis and the portal hypertension.  Small volume ascites and splenomegaly. . 10/16/2018 Left nephrectomy showed renal cell carcinoma, clear-cell type, nuclear grade 4, tumor extends into the renal vein and renal sinus fat.  Urethral, vascular and or resection margins are negative for tumor pT3a Nx Mx  01/28/2019 patient had another CT chest abdomen pelvis done with contrast Showed interval left nephrectomy.  Scattered tiny pulmonary nodules bilaterally.  Nonspecific.  No other evidence of metastatic disease.  Cirrhosis changes extensive coronary and aortic atherosclerosis  07/14/2019 patient underwent surveillance CT chest abdomen pelvis without contrast Interval progression of pulmonary metastasis with new enlarged right paratracheal lymph node 1.7 cm, previously 0.6 cm one-point since worrisome for metastatic adenopathy.  Multiple progressive pulmonary nodules, index nodule within the lingula measures 1.3 cm, previously 3 mm. Index subpleural nodule within the anterior right upper lobe  measuring 1.8 cm, previously 3 cm Index nodule within the post lateral right lower lobe measuring 5 mm, new from previous care Aortic sclerosis.  Three-vessel coronary artery calcification noted.  Cirrhosis changes.  Splenomegaly.  Patient was referred to establish care with medical oncology for further discussion and evaluation of metastatic renal cell carcinoma. Patient reports feeling well at baseline today.  Denies any shortness of breath, cough, hemoptysis, back pain, headache. He quitted smoking 2015.  Not currently actively drinking alcohol as well. Patient has chronic back pain unchanged.   # Liver cirrhosis with small amount of ascites/splenomegaly patient sees gastroenterology Dr. [name].  . He will get ultrasound surveillance due in February 2021 for screening of [place].  EGD surveillance for Barrett's plan in April 2021.  INTERVAL HISTORY Henry Moore is a 59 y.o. male who has above history reviewed by me today presents for follow up visit for management of stage IV RCC Problems and complaints are listed below: Patient is status post cycle 1 nivolumab and ipilimumab 1 week ago. Today follow-up for evaluation of tolerability. He reports feeling well.  No new complaints.     Review of Systems  Constitutional: Positive for fatigue. Negative for appetite change, chills, fever and unexpected weight change.  HENT:   Negative for hearing loss and voice change.   Eyes: Negative for eye problems and icterus.  Respiratory: Negative for chest tightness, cough and shortness of breath.   Cardiovascular: Negative for chest pain and leg swelling.  Gastrointestinal: Negative for abdominal distention and abdominal pain.  Endocrine: Negative for hot flashes.  Genitourinary: Negative for difficulty urinating, dysuria and frequency.   Musculoskeletal: Positive for back pain. Negative for arthralgias.  Skin: Negative for itching and rash.  Neurological: Negative for light-headedness and  numbness.  Hematological: Negative for adenopathy. Does not bruise/bleed easily.  Psychiatric/Behavioral: Negative for confusion.    MEDICAL HISTORY:  Past Medical History:  Diagnosis Date  . Cough    SINUS DRAINAGE CAUSING COUGHING , REPORTS CLEAR MUCOUS   . Diabetes mellitus without complication ([place])   . HTN (hypertension)   . Hyperlipemia   . Left renal mass   . Platelets decreased ([place])    DENIES UNSUAL BLEEDING   . PONV (postoperative nausea and vomiting)   . Renal cell carcinoma ([place]) 08/04/2019  . WBC decreased     SURGICAL HISTORY: Past Surgical History:  Procedure Laterality Date  . ANKLE SURGERY Left   . ANTERIOR CERVICAL DECOMP/DISCECTOMY FUSION N/A 04/16/2014   Procedure: ANTERIOR CERVICAL DECOMPRESSION/DISCECTOMY FUSION  (ACDF C5-C7)   (2 LEVELS)     ;  Surgeon: Melina Schools, MD;  Location: [facility];  Service: Orthopedics;  Laterality: N/A;  . APPENDECTOMY    . BACK SURGERY    . ESOPHAGOGASTRODUODENOSCOPY (EGD) WITH PROPOFOL N/A 02/06/2019   Procedure: ESOPHAGOGASTRODUODENOSCOPY (EGD) WITH PROPOFOL;  Surgeon: [name], MD;  Location[facility] ENDOSCOPY;  Service: Gastroenterology;  Laterality: N/A;  . ESOPHAGOGASTRODUODENOSCOPY (EGD) WITH PROPOFOL N/A 03/04/2019   Procedure: ESOPHAGOGASTRODUODENOSCOPY (EGD) WITH PROPOFOL;  Surgeon: Lin Landsman, MD;  Location[facility] ENDOSCOPY;  Service: Gastroenterology;  Laterality: N/A;  . ESOPHAGOGASTRODUODENOSCOPY (EGD) WITH PROPOFOL N/A 04/22/2019   Procedure: ESOPHAGOGASTRODUODENOSCOPY (EGD) WITH PROPOFOL;  Surgeon: [name], MD;  Location: [facility] ENDOSCOPY;  Service: Gastroenterology;  Laterality: N/A;  . FRACTURE SURGERY    . HYDROCELE EXCISION    . KNEE ARTHROSCOPY Bilateral   . LAPAROSCOPIC NEPHRECTOMY, HAND ASSISTED Left 10/16/2018   Procedure: LEFT HAND ASSISTED LAPAROSCOPIC NEPHRECTOMY;  Surgeon: Lucas Mallow, MD;  Location: [facility] ORS;  Service: Urology;  Laterality: Left;  . NASAL SINUS SURGERY     X 2  . PENILE  PROSTHESIS IMPLANT  10 YEARS AGO  . SHOULDER SURGERY Left     SOCIAL HISTORY: Social History   Socioeconomic History  . Marital status: Married    Spouse name: Not on file  . Number of children: Not on file  . Years of education: Not on file  . Highest education level: Not on file  Occupational History  . Not on file  Tobacco Use  . Smoking status: Former Smoker    Packs/day: 0.25    Years: 20.00    Pack years: 5.00    Quit date: 2015    Years since quitting: 6.0  . Smokeless tobacco: Never Used  Substance and Sexual Activity  . Alcohol use: Not Currently    Alcohol/week: 0.0 standard drinks    Comment: no alchol in 1 year  . Drug use: No  . Sexual activity: Not on file  Other Topics Concern  . Not on file  Social History Narrative  . Not on file   Social Determinants of Health   Financial Resource Strain:   . Difficulty of Paying Living Expenses: Not on file  Food Insecurity:   . Worried About Charity fundraiser in the Last Year: Not on file  . Ran Out of Food in the Last Year: Not on file  Transportation Needs:   . Lack of Transportation (Medical): Not on file  . Lack of Transportation (Non-Medical): Not on file  Physical Activity:   . Days of Exercise per Week: Not on file  . Minutes of Exercise per Session: Not on file  Stress:   . Feeling  of Stress : Not on file  Social Connections:   . Frequency of Communication with Friends and Family: Not on file  . Frequency of Social Gatherings with Friends and Family: Not on file  . Attends Religious Services: Not on file  . Active Member of Clubs or Organizations: Not on file  . Attends Archivist Meetings: Not on file  . Marital Status: Not on file  Intimate Partner Violence:   . Fear of Current or Ex-Partner: Not on file  . Emotionally Abused: Not on file  . Physically Abused: Not on file  . Sexually Abused: Not on file    FAMILY HISTORY: Family History  Problem Relation Age of Onset  .  Diabetes Mother   . Cancer Mother        neuroendocrine cancer  . Cancer Father        brain  . Cancer Maternal Grandmother        stomcah  . Alzheimer's disease Paternal Grandmother   . Lung cancer Paternal Grandfather     ALLERGIES:  is allergic to codeine and mucinex [guaifenesin er].  MEDICATIONS:  Current Outpatient Medications  Medication Sig Dispense Refill  . ACCU-CHEK AVIVA PLUS test strip CHECK BL00D SUGAR ONCE OR TWICE DAILY. (Patient taking differently: 1 each by Other route as needed. ) 100 each PRN  . amLODipine (NORVASC) 10 MG tablet Take 10 mg by mouth daily.    . carvedilol (COREG) 25 MG tablet Take 1 tablet (25 mg total) by mouth 2 (two) times daily with a meal. 180 tablet 3  . glipiZIDE (GLUCOTROL) 5 MG tablet Take 1 tablet (5 mg total) by mouth daily before breakfast. 90 tablet 3  . hydrochlorothiazide (HYDRODIURIL) 25 MG tablet TAKE 1 TABLET DAILY. 90 tablet 0  . losartan (COZAAR) 100 MG tablet Take 1 tablet by mouth daily. 90 tablet 0  . lovastatin (MEVACOR) 40 MG tablet Take 1 tablet (40 mg total) by mouth at bedtime. 60 tablet 0  . metFORMIN (GLUCOPHAGE-XR) 500 MG 24 hr tablet Take 500 mg by mouth daily with breakfast.     . prochlorperazine (COMPAZINE) 10 MG tablet Take 1 tablet (10 mg total) by mouth every 6 (six) hours as needed for nausea or vomiting. 30 tablet 0   No current facility-administered medications for this visit.     PHYSICAL EXAMINATION: ECOG PERFORMANCE STATUS: 1 - Symptomatic but completely ambulatory Vitals:   08/21/19 0907  BP: 137/83  Pulse: 84  Resp: 16  Temp: 98.2 F (36.8 C)   Filed Weights   08/21/19 0907  Weight: 245 lb 11.2 oz (111.4 kg)    Physical Exam Constitutional:      General: He is not in acute distress.    Appearance: He is obese.  HENT:     Head: Normocephalic and atraumatic.  Eyes:     General: No scleral icterus.    Pupils: Pupils are equal, round, and reactive to light.  Cardiovascular:     Rate  and Rhythm: Normal rate and regular rhythm.     Heart sounds: Normal heart sounds.  Pulmonary:     Effort: Pulmonary effort is normal. No respiratory distress.     Breath sounds: No wheezing.  Abdominal:     General: Bowel sounds are normal. There is no distension.     Palpations: Abdomen is soft. There is no mass.     Tenderness: There is no abdominal tenderness.  Musculoskeletal:        General: No  deformity. Normal range of motion.     Cervical back: Normal range of motion and neck supple.  Skin:    General: Skin is warm and dry.     Findings: No erythema or rash.  Neurological:     Mental Status: He is alert and oriented to person, place, and time.     Cranial Nerves: No cranial nerve deficit.     Coordination: Coordination normal.  Psychiatric:        Mood and Affect: Mood normal.        Behavior: Behavior normal.        Thought Content: Thought content normal.     LABORATORY DATA:  I have reviewed the data as listed Lab Results  Component Value Date   WBC 3.1 (L) 08/21/2019   HGB 10.4 (L) 08/21/2019   HCT 31.2 (L) 08/21/2019   MCV 89.4 08/21/2019   PLT 72 (L) 08/21/2019   Recent Labs    07/28/19 1208 08/14/19 0809 08/21/19 0843  NA 135 135 134*  K 4.5 4.6 4.3  CL 106 106 105  CO2 23 20* 19*  GLUCOSE 274* 220* 180*  BUN 21* 29* 32*  CREATININE 1.75* 1.87* 1.77*  CALCIUM 9.1 8.7* 9.1  GFRNONAA 43* 39* 42*  GFRAA 49* 46* 49*  PROT 8.4* 7.9 8.2*  ALBUMIN 4.2 4.2 4.3  AST 29 35 28  ALT 26 31 23   ALKPHOS 124 128* 135*  BILITOT 1.0 1.0 1.0   Iron/TIBC/Ferritin/ %Sat    Component Value Date/Time   IRON 61 08/21/2019 0843   IRON 105 10/15/2018 1325   TIBC 284 08/21/2019 0843   TIBC 244 (L) 10/15/2018 1325   FERRITIN 237 08/21/2019 0843   FERRITIN 588 (H) 10/15/2018 1325   IRONPCTSAT 22 08/21/2019 0843   IRONPCTSAT 43 10/15/2018 1325      RADIOGRAPHIC STUDIES: I have personally reviewed the radiological images as listed and agreed with the findings  in the report.  MR Brain W Wo Contrast  Result Date: 08/05/2019 CLINICAL DATA:  History of renal cell carcinoma.  Staging. EXAM: MRI HEAD WITHOUT AND WITH CONTRAST TECHNIQUE: Multiplanar, multiecho pulse sequences of the brain and surrounding structures were obtained without and with intravenous contrast. CONTRAST:  29mL GADAVIST GADOBUTROL 1 MMOL/ML IV SOLN COMPARISON:  None. FINDINGS: Brain: Remote microhemorrhage in the left frontal lobe. No underlying edema, mass, or abnormal-nonspecific in isolation. No infarct, hydrocephalus, collection, or abnormal intracranial enhancement. Vascular: Normal flow voids and vascular enhancements Skull and upper cervical spine: Normal marrow signal. Sinuses/Orbits: Mild mucosal thickening scattered in paranasal sinuses with frothy secretions in the right sphenoid sinus. There has been prior endoscopic sinus surgery. IMPRESSION: No evidence of metastatic disease. Electronically Signed   By: Monte Fantasia M.D.   On: 08/05/2019 08:52   NM Bone Scan Whole Body  Result Date: 08/11/2019 CLINICAL DATA:  Renal cell carcinoma. EXAM: NUCLEAR MEDICINE WHOLE BODY BONE SCAN TECHNIQUE: Whole body anterior and posterior images were obtained approximately 3 hours after intravenous injection of radiopharmaceutical. RADIOPHARMACEUTICALS:  23 mCi Technetium-18m MDP IV COMPARISON:  CT chest, abdomen, and pelvis 07/14/2019 FINDINGS: No abnormal foci of increased or decreased radiotracer uptake identified to suggest osseous metastasis. Normal physiologic activity within the right kidney and bladder. Left kidney surgically absent. IMPRESSION: 1. No findings to suggest bone metastases. 2. Left nephrectomy. Electronically Signed   By: Kerby Moors M.D.   On: 08/11/2019 16:06      ASSESSMENT & PLAN:  1. Renal cell carcinoma, unspecified laterality (  [place])   2. Chronic kidney disease, stage 3b   3. Other cirrhosis of liver ([place])   4. 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 Currently on nivolumab and ipilimumab treatments. Status post post 1 cycle. Labs reviewed and discussed with patient. Counts are stable.  Clinically he is also doing well.   #Elevated TSH, repeat TSH showed normalizing numbers, free T4 was checked and was normal. Continue monitor thyroid function every 6 to 8 weeks.  #Chronic problems include cirrhosis of liver, splenomegaly Liver function are stable. 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  His counts are stable.  #Anemia multifactorial, likely secondary to cancer, chronic kidney disease.  Folate level normal.  Iron level normal.  Vitamin B12 is pending. Refer to palliative service to establish care. Follow-up 2 week lab MD for assessment evaluation prior to the next cycle of ipilimumab and nivolumab.   All questions were answered. The patient knows to call the clinic with any problems questions or concerns.  Cc Albina Billet, MD   Earlie Server, MD, PhD Hematology Oncology [facility] Pager- [phone] 08/21/2019

## 2019-08-21 NOTE — Progress Notes (Signed)
[place]  [phone] [phone]   Name: Henry Moore Date: 08/21/2019 MRN: [id number]  DOB: [birth year]  Patient Care Team: Albina Billet, MD as PCP - General (Internal Medicine)    REASON FOR CONSULTATION: Henry Moore is a 59 y.o. 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  Patient was referred to palliative care to help address goals and manage ongoing symptoms.  SOCIAL HISTORY:     reports that he quit smoking about 6 years ago. He has a 5.00 pack-year smoking history. He has never used smokeless tobacco. He reports previous alcohol use. He reports that he does not use drugs.   Patient lives at home with his wife, son, and daughter.  Both children are in their 48s.  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  He was disabled due to a back injury.  ADVANCE DIRECTIVES:  Does not have  CODE STATUS:   PAST MEDICAL HISTORY: Past Medical History:  Diagnosis Date  . Cough    SINUS DRAINAGE CAUSING COUGHING , REPORTS CLEAR MUCOUS   . Diabetes mellitus without complication ([place])   . HTN (hypertension)   . Hyperlipemia   . Left renal mass   . Platelets decreased ([place])    DENIES UNSUAL BLEEDING   . PONV (postoperative nausea and vomiting)   . Renal cell carcinoma ([place]) 08/04/2019  . WBC decreased     PAST SURGICAL HISTORY:  Past Surgical History:  Procedure Laterality Date  . ANKLE SURGERY Left   . ANTERIOR CERVICAL DECOMP/DISCECTOMY FUSION N/A 04/16/2014   Procedure: ANTERIOR CERVICAL DECOMPRESSION/DISCECTOMY FUSION  (ACDF C5-C7)   (2 LEVELS)     ;  Surgeon: Melina Schools, MD;  Location: [facility];  Service: Orthopedics;  Laterality: N/A;  . APPENDECTOMY    . BACK SURGERY    . ESOPHAGOGASTRODUODENOSCOPY (EGD) WITH PROPOFOL N/A 02/06/2019   Procedure:  ESOPHAGOGASTRODUODENOSCOPY (EGD) WITH PROPOFOL;  Surgeon: [name], MD;  Location: [facility] ENDOSCOPY;  Service: Gastroenterology;  Laterality: N/A;  . ESOPHAGOGASTRODUODENOSCOPY (EGD) WITH PROPOFOL N/A 03/04/2019   Procedure: ESOPHAGOGASTRODUODENOSCOPY (EGD) WITH PROPOFOL;  Surgeon: Lin Landsman, MD;  Location: [facility] ENDOSCOPY;  Service: Gastroenterology;  Laterality: N/A;  . ESOPHAGOGASTRODUODENOSCOPY (EGD) WITH PROPOFOL N/A 04/22/2019   Procedure: ESOPHAGOGASTRODUODENOSCOPY (EGD) WITH PROPOFOL;  Surgeon: [name], MD;  Location: [facility] ENDOSCOPY;  Service: Gastroenterology;  Laterality: N/A;  . FRACTURE SURGERY    . HYDROCELE EXCISION    . KNEE ARTHROSCOPY Bilateral   . LAPAROSCOPIC NEPHRECTOMY, HAND ASSISTED Left 10/16/2018   Procedure: LEFT HAND ASSISTED LAPAROSCOPIC NEPHRECTOMY;  Surgeon: Lucas Mallow, MD;  Location: [facility] ORS;  Service: Urology;  Laterality: Left;  . NASAL SINUS SURGERY     X 2  . 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 SHOULDER SURGERY Left     HEMATOLOGY/ONCOLOGY HISTORY:  Oncology History  Renal cell carcinoma ([place])  08/04/2019 Initial Diagnosis   Renal cell carcinoma ([place])   08/14/2019 -  Chemotherapy   The patient had ipilimumab (YERVOY) 100 mg in sodium chloride 0.9 % 50 mL chemo infusion, 0.9 mg/kg = 110 mg, Intravenous,  Once, 1 of 4 cycles Administration: 100 mg (08/14/2019) nivolumab (OPDIVO) 340 mg in sodium chloride 0.9 % 100 mL chemo infusion, 334 mg, Intravenous, Once, 1 of 6 cycles Administration: 340 mg (08/14/2019)  for chemotherapy treatment.  ALLERGIES:  is allergic to codeine and mucinex [guaifenesin er].  MEDICATIONS:  Current Outpatient Medications  Medication Sig Dispense Refill  . ACCU-CHEK AVIVA PLUS test strip CHECK BL00D SUGAR ONCE OR TWICE DAILY. (Patient taking differently: 1 each by Other route as needed. ) 100 each PRN  . amLODipine (NORVASC) 10 MG tablet Take 10 mg by mouth daily.    . carvedilol (COREG) 25 MG tablet  Take 1 tablet (25 mg total) by mouth 2 (two) times daily with a meal. 180 tablet 3  . glipiZIDE (GLUCOTROL) 5 MG tablet Take 1 tablet (5 mg total) by mouth daily before breakfast. 90 tablet 3  . hydrochlorothiazide (HYDRODIURIL) 25 MG tablet TAKE 1 TABLET DAILY. 90 tablet 0  . losartan (COZAAR) 100 MG tablet Take 1 tablet by mouth daily. 90 tablet 0  . lovastatin (MEVACOR) 40 MG tablet Take 1 tablet (40 mg total) by mouth at bedtime. 60 tablet 0  . metFORMIN (GLUCOPHAGE-XR) 500 MG 24 hr tablet Take 500 mg by mouth daily with breakfast.     . prochlorperazine (COMPAZINE) 10 MG tablet Take 1 tablet (10 mg total) by mouth every 6 (six) hours as needed for nausea or vomiting. 30 tablet 0   No current facility-administered medications for this visit.    VITAL SIGNS: There were no vitals taken for this visit. There were no vitals filed for this visit.  Estimated body mass index is 32.42 kg/m as calculated from the following:   Height as of 07/28/19: 6' 1"  (1.854 m).   Weight as of an earlier encounter on 08/21/19: 245 lb 11.2 oz (111.4 kg).  LABS: CBC:    Component Value Date/Time   WBC 3.1 (L) 08/21/2019 0843   HGB 10.4 (L) 08/21/2019 0843   HGB 10.6 (L) 05/01/2019 1413   HCT 31.2 (L) 08/21/2019 0843   HCT 31.7 (L) 05/01/2019 1413   PLT 72 (L) 08/21/2019 0843   PLT 71 (LL) 05/01/2019 1413   MCV 89.4 08/21/2019 0843   MCV 93 05/01/2019 1413   NEUTROABS 2.0 08/21/2019 0843   NEUTROABS 1.8 10/15/2018 1325   LYMPHSABS 0.5 (L) 08/21/2019 0843   LYMPHSABS 0.5 (L) 10/15/2018 1325   MONOABS 0.4 08/21/2019 0843   EOSABS 0.2 08/21/2019 0843   EOSABS 0.2 10/15/2018 1325   BASOSABS 0.0 08/21/2019 0843   BASOSABS 0.0 10/15/2018 1325   Comprehensive Metabolic Panel:    Component Value Date/Time   NA 134 (L) 08/21/2019 0843   NA 138 05/01/2019 1413   K 4.3 08/21/2019 0843   CL 105 08/21/2019 0843   CO2 19 (L) 08/21/2019 0843   BUN 32 (H) 08/21/2019 0843   BUN 28 (H) 05/01/2019 1413    CREATININE 1.77 (H) 08/21/2019 0843   GLUCOSE 180 (H) 08/21/2019 0843   CALCIUM 9.1 08/21/2019 0843   CALCIUM 8.9 05/12/2019 1121   AST 28 08/21/2019 0843   AST 41 (H) 06/18/2018 1111   ALT 23 08/21/2019 0843   ALT 33 06/18/2018 1111   ALKPHOS 135 (H) 08/21/2019 0843   BILITOT 1.0 08/21/2019 0843   BILITOT 0.7 05/01/2019 1413   PROT 8.2 (H) 08/21/2019 0843   PROT 7.8 05/01/2019 1413   ALBUMIN 4.3 08/21/2019 0843   ALBUMIN 4.4 05/01/2019 1413    RADIOGRAPHIC STUDIES: MR Brain W Wo Contrast  Result Date: 08/05/2019 CLINICAL DATA:  History of renal cell carcinoma.  Staging. EXAM: MRI HEAD WITHOUT AND WITH CONTRAST TECHNIQUE: Multiplanar, multiecho pulse sequences of the brain and surrounding structures were  obtained without and with intravenous contrast. CONTRAST:  18m GADAVIST GADOBUTROL 1 MMOL/ML IV SOLN COMPARISON:  None. FINDINGS: Brain: Remote microhemorrhage in the left frontal lobe. No underlying edema, mass, or abnormal-nonspecific in isolation. No infarct, hydrocephalus, collection, or abnormal intracranial enhancement. Vascular: Normal flow voids and vascular enhancements Skull and upper cervical spine: Normal marrow signal. Sinuses/Orbits: Mild mucosal thickening scattered in paranasal sinuses with frothy secretions in the right sphenoid sinus. There has been prior endoscopic sinus surgery. IMPRESSION: No evidence of metastatic disease. Electronically Signed   By: JMonte FantasiaM.D.   On: 08/05/2019 08:52   NM Bone Scan Whole Body  Result Date: 08/11/2019 CLINICAL DATA:  Renal cell carcinoma. EXAM: NUCLEAR MEDICINE WHOLE BODY BONE SCAN TECHNIQUE: Whole body anterior and posterior images were obtained approximately 3 hours after intravenous injection of radiopharmaceutical. RADIOPHARMACEUTICALS:  23 mCi Technetium-935mDP IV COMPARISON:  CT chest, abdomen, and pelvis 07/14/2019 FINDINGS: No abnormal foci of increased or decreased radiotracer uptake identified to suggest osseous  metastasis. Normal physiologic activity within the right kidney and bladder. Left kidney surgically absent. IMPRESSION: 1. No findings to suggest bone metastases. 2. Left nephrectomy. Electronically Signed   By: TaKerby Moors.D.   On: 08/11/2019 16:06    PERFORMANCE STATUS (ECOG) : 1 - Symptomatic but completely ambulatory  Review of Systems Unless otherwise noted, a complete review of systems is negative.  Physical Exam General: NAD Pulmonary: Unlabored Extremities: no edema, no joint deformities Skin: no rashes Neurological: Grossly nonfocal  IMPRESSION: Met with patient today in the clinic.  Introduced palliative care services and attempted establish therapeutic rapport.  Patient is actually familiar to me from the care of his mother, who also has cancer.  Patient reports that he is doing well.  He denies any significant changes or concerns.  He has no distressing symptoms at the present time.  Patient has had 1 treatment with immunotherapy and is tolerating well so far.  Patient says that he recognizes that his cancer is "terminal."  He remains in agreement with the current scope of treatment.  He spoke candidly about his end-of-life and having given some consideration to planning for the future.  We did discuss establishing ACP documents and I suggested completion of a MOST form but patient wanted to defer those decisions for now.  He did say that he does not think that he would want to be resuscitated in the event that his disease is progressing.  He says "why bring me back just for me to die again."  However, again, patient did not want to make those decisions today.  PLAN: -Continue current scope of treatment -Patient wanted to defer ACP/MOST Form discussions -Follow-up telephone visit in 1 to 2 months   Patient expressed understanding and was in agreement with this plan. He also understands that He can call the clinic at any time with any questions, concerns, or complaints.      Time Total: 30 minutes  Visit consisted of counseling and education dealing with the complex and emotionally intense issues of symptom management and palliative care in the setting of serious and potentially life-threatening illness.Greater than 50%  of this time was spent counseling and coordinating care related to the above assessment and plan.  Signed by: JoAltha HarmPhD, NP-C

## 2019-08-21 NOTE — Progress Notes (Signed)
Patient has noticed an occasional cough that may be related to sinus drainage.

## 2019-08-22 ENCOUNTER — Telehealth: Payer: Self-pay | Admitting: *Deleted

## 2019-08-22 NOTE — Telephone Encounter (Signed)
He is on immunotherapy-no steroids that I can tell. He should contact his PCP given that is who manages his diabetes.   Faythe Casa, NP 08/22/2019 1:31 PM

## 2019-08-22 NOTE — Telephone Encounter (Signed)
Patient called answering service reporting that his glucose is over 400. Asking what to do. Please advise

## 2019-08-22 NOTE — Telephone Encounter (Signed)
Patient advised to contact PCP, He states he will call PCP

## 2019-08-27 ENCOUNTER — Telehealth: Payer: Self-pay

## 2019-08-27 NOTE — Telephone Encounter (Signed)
Spoke to representative at [facility] to cancel [facility] test requisition. New order was placed for [facility] CD[facility] on specimen [phone].  pathology report and demo information sheet faxed separately.   PO:[phone]

## 2019-09-03 ENCOUNTER — Telehealth: Payer: Self-pay

## 2019-09-03 NOTE — Telephone Encounter (Signed)
Received fax with notification that testing was on hold. I contacted [facility] and spoke to Donahue. He states that testing was being done twice on this patient's specimen. I told him there was no need for it to be done twice, to cancel one order and proceed with the active one [id number]). Estimated time of result is Feb 11.

## 2019-09-04 ENCOUNTER — Encounter: Payer: Self-pay | Admitting: Oncology

## 2019-09-04 ENCOUNTER — Inpatient Hospital Stay (HOSPITAL_BASED_OUTPATIENT_CLINIC_OR_DEPARTMENT_OTHER): Payer: BC Managed Care – PPO | Admitting: Oncology

## 2019-09-04 ENCOUNTER — Inpatient Hospital Stay: Payer: BC Managed Care – PPO

## 2019-09-04 ENCOUNTER — Other Ambulatory Visit: Payer: Self-pay

## 2019-09-04 ENCOUNTER — Inpatient Hospital Stay: Payer: BC Managed Care – PPO | Attending: Oncology

## 2019-09-04 VITALS — BP 155/79 | HR 77 | Temp 96.7°F | Resp 16 | Wt 245.2 lb

## 2019-09-04 DIAGNOSIS — Z5112 Encounter for antineoplastic immunotherapy: Secondary | ICD-10-CM

## 2019-09-04 DIAGNOSIS — E119 Type 2 diabetes mellitus without complications: Secondary | ICD-10-CM | POA: Insufficient documentation

## 2019-09-04 DIAGNOSIS — D696 Thrombocytopenia, unspecified: Secondary | ICD-10-CM | POA: Diagnosis not present

## 2019-09-04 DIAGNOSIS — C649 Malignant neoplasm of unspecified kidney, except renal pelvis: Secondary | ICD-10-CM

## 2019-09-04 DIAGNOSIS — I2510 Atherosclerotic heart disease of native coronary artery without angina pectoris: Secondary | ICD-10-CM | POA: Insufficient documentation

## 2019-09-04 DIAGNOSIS — I10 Essential (primary) hypertension: Secondary | ICD-10-CM | POA: Diagnosis not present

## 2019-09-04 DIAGNOSIS — K7460 Unspecified cirrhosis of liver: Secondary | ICD-10-CM | POA: Insufficient documentation

## 2019-09-04 DIAGNOSIS — R161 Splenomegaly, not elsewhere classified: Secondary | ICD-10-CM | POA: Insufficient documentation

## 2019-09-04 DIAGNOSIS — Z803 Family history of malignant neoplasm of breast: Secondary | ICD-10-CM | POA: Diagnosis not present

## 2019-09-04 DIAGNOSIS — Z87891 Personal history of nicotine dependence: Secondary | ICD-10-CM | POA: Insufficient documentation

## 2019-09-04 DIAGNOSIS — Z8041 Family history of malignant neoplasm of ovary: Secondary | ICD-10-CM | POA: Diagnosis not present

## 2019-09-04 DIAGNOSIS — R509 Fever, unspecified: Secondary | ICD-10-CM | POA: Diagnosis not present

## 2019-09-04 DIAGNOSIS — R918 Other nonspecific abnormal finding of lung field: Secondary | ICD-10-CM | POA: Insufficient documentation

## 2019-09-04 DIAGNOSIS — E785 Hyperlipidemia, unspecified: Secondary | ICD-10-CM | POA: Insufficient documentation

## 2019-09-04 DIAGNOSIS — C7800 Secondary malignant neoplasm of unspecified lung: Secondary | ICD-10-CM | POA: Insufficient documentation

## 2019-09-04 DIAGNOSIS — M549 Dorsalgia, unspecified: Secondary | ICD-10-CM | POA: Diagnosis not present

## 2019-09-04 DIAGNOSIS — D6959 Other secondary thrombocytopenia: Secondary | ICD-10-CM | POA: Insufficient documentation

## 2019-09-04 DIAGNOSIS — R188 Other ascites: Secondary | ICD-10-CM | POA: Insufficient documentation

## 2019-09-04 DIAGNOSIS — Z5111 Encounter for antineoplastic chemotherapy: Secondary | ICD-10-CM | POA: Insufficient documentation

## 2019-09-04 DIAGNOSIS — M5136 Other intervertebral disc degeneration, lumbar region: Secondary | ICD-10-CM | POA: Insufficient documentation

## 2019-09-04 DIAGNOSIS — G8929 Other chronic pain: Secondary | ICD-10-CM | POA: Insufficient documentation

## 2019-09-04 DIAGNOSIS — C642 Malignant neoplasm of left kidney, except renal pelvis: Secondary | ICD-10-CM | POA: Insufficient documentation

## 2019-09-04 DIAGNOSIS — K7469 Other cirrhosis of liver: Secondary | ICD-10-CM | POA: Diagnosis not present

## 2019-09-04 DIAGNOSIS — Z79899 Other long term (current) drug therapy: Secondary | ICD-10-CM | POA: Diagnosis not present

## 2019-09-04 DIAGNOSIS — Z7984 Long term (current) use of oral hypoglycemic drugs: Secondary | ICD-10-CM | POA: Insufficient documentation

## 2019-09-04 DIAGNOSIS — D649 Anemia, unspecified: Secondary | ICD-10-CM | POA: Insufficient documentation

## 2019-09-04 DIAGNOSIS — Z801 Family history of malignant neoplasm of trachea, bronchus and lung: Secondary | ICD-10-CM | POA: Diagnosis not present

## 2019-09-04 LAB — CBC WITH DIFFERENTIAL/PLATELET
Abs Immature Granulocytes: 0.02 10*3/uL (ref 0.00–0.07)
Basophils Absolute: 0 10*3/uL (ref 0.0–0.1)
Basophils Relative: 1 %
Eosinophils Absolute: 0.2 10*3/uL (ref 0.0–0.5)
Eosinophils Relative: 6 %
HCT: 31.5 % — ABNORMAL LOW (ref 39.0–52.0)
Hemoglobin: 10.3 g/dL — ABNORMAL LOW (ref 13.0–17.0)
Immature Granulocytes: 1 %
Lymphocytes Relative: 18 %
Lymphs Abs: 0.5 10*3/uL — ABNORMAL LOW (ref 0.7–4.0)
MCH: 30 pg (ref 26.0–34.0)
MCHC: 32.7 g/dL (ref 30.0–36.0)
MCV: 91.8 fL (ref 80.0–100.0)
Monocytes Absolute: 0.3 10*3/uL (ref 0.1–1.0)
Monocytes Relative: 12 %
Neutro Abs: 1.8 10*3/uL (ref 1.7–7.7)
Neutrophils Relative %: 62 %
Platelets: 77 10*3/uL — ABNORMAL LOW (ref 150–400)
RBC: 3.43 MIL/uL — ABNORMAL LOW (ref 4.22–5.81)
RDW: 13.5 % (ref 11.5–15.5)
WBC: 2.9 10*3/uL — ABNORMAL LOW (ref 4.0–10.5)
nRBC: 0 % (ref 0.0–0.2)

## 2019-09-04 LAB — COMPREHENSIVE METABOLIC PANEL
ALT: 21 U/L (ref 0–44)
AST: 27 U/L (ref 15–41)
Albumin: 4.2 g/dL (ref 3.5–5.0)
Alkaline Phosphatase: 114 U/L (ref 38–126)
Anion gap: 10 (ref 5–15)
BUN: 26 mg/dL — ABNORMAL HIGH (ref 6–20)
CO2: 20 mmol/L — ABNORMAL LOW (ref 22–32)
Calcium: 8.9 mg/dL (ref 8.9–10.3)
Chloride: 104 mmol/L (ref 98–111)
Creatinine, Ser: 1.67 mg/dL — ABNORMAL HIGH (ref 0.61–1.24)
GFR calc Af Amer: 52 mL/min — ABNORMAL LOW (ref >60)
GFR calc non Af Amer: 45 mL/min — ABNORMAL LOW (ref >60)
Glucose, Bld: 117 mg/dL — ABNORMAL HIGH (ref 70–99)
Potassium: 4.3 mmol/L (ref 3.5–5.1)
Sodium: 134 mmol/L — ABNORMAL LOW (ref 135–145)
Total Bilirubin: 0.9 mg/dL (ref 0.3–1.2)
Total Protein: 8 g/dL (ref 6.5–8.1)

## 2019-09-04 MED ORDER — DIPHENHYDRAMINE HCL 50 MG/ML IJ SOLN
25.0000 mg | Freq: Once | INTRAMUSCULAR | Status: AC
  Administered 2019-09-04: 25 mg via INTRAVENOUS
  Filled 2019-09-04: qty 1

## 2019-09-04 MED ORDER — SODIUM CHLORIDE 0.9 % IV SOLN
100.0000 mg | Freq: Once | INTRAVENOUS | Status: AC
  Administered 2019-09-04: 100 mg via INTRAVENOUS
  Filled 2019-09-04: qty 20

## 2019-09-04 MED ORDER — SODIUM CHLORIDE 0.9 % IV SOLN
340.0000 mg | Freq: Once | INTRAVENOUS | Status: AC
  Administered 2019-09-04: 340 mg via INTRAVENOUS
  Filled 2019-09-04: qty 24

## 2019-09-04 MED ORDER — FAMOTIDINE IN NACL 20-0.9 MG/50ML-% IV SOLN
20.0000 mg | Freq: Once | INTRAVENOUS | Status: AC
  Administered 2019-09-04: 20 mg via INTRAVENOUS
  Filled 2019-09-04: qty 50

## 2019-09-04 MED ORDER — SODIUM CHLORIDE 0.9 % IV SOLN
Freq: Once | INTRAVENOUS | Status: AC
  Filled 2019-09-04: qty 250

## 2019-09-04 NOTE — Progress Notes (Signed)
Hematology/Oncology follow-up [facility] Telephone:([phone] [phone]   Patient Care Team: Albina Billet, MD as PCP - General (Internal Medicine) Earlie Server, MD as Consulting Physician (Hematology and Oncology)  REFERRING PROVIDER: Albina Billet, MD  CHIEF COMPLAINTS/REASON FOR VISIT:  Follow-up for kidney cancer  HISTORY OF PRESENTING ILLNESS:   Henry Moore is a  59 y.o.  male with PMH listed below was seen in consultation at the request of  Albina Billet, MD  for evaluation of kidney cancer Extensive medical records were reviewed Patient had MRI lumbar spine without contrast done on 07/13/2018 which showed mired lumbar degenerative disease. CT thoracic spine was done on 08/05/2018 which showed 5 cm left renal mass. 09/04/2018 CT abdomen and pelvis with and without contrast showed 5 cm round enhancing solid mass in the left kidney.  No involvement of left renal vein.  No lymphadenopathy Morphologic changes consistent with cirrhosis and the portal hypertension.  Small volume ascites and splenomegaly. . 10/16/2018 Left nephrectomy showed renal cell carcinoma, clear-cell type, nuclear grade 4, tumor extends into the renal vein and renal sinus fat.  Urethral, vascular and or resection margins are negative for tumor pT3a Nx Mx  01/28/2019 patient had another CT chest abdomen pelvis done with contrast Showed interval left nephrectomy.  Scattered tiny pulmonary nodules bilaterally.  Nonspecific.  No other evidence of metastatic disease.  Cirrhosis changes extensive coronary and aortic atherosclerosis  07/14/2019 patient underwent surveillance CT chest abdomen pelvis without contrast Interval progression of pulmonary metastasis with new enlarged right paratracheal lymph node 1.7 cm, previously 0.6 cm one-point since worrisome for metastatic adenopathy.  Multiple progressive pulmonary nodules, index nodule within the lingula measures 1.3 cm, previously 3  mm. Index subpleural nodule within the anterior right upper lobe measuring 1.8 cm, previously 3 cm Index nodule within the post lateral right lower lobe measuring 5 mm, new from previous care Aortic sclerosis.  Three-vessel coronary artery calcification noted.  Cirrhosis changes.  Splenomegaly.  Patient was referred to establish care with medical oncology for further discussion and evaluation of metastatic renal cell carcinoma. Patient reports feeling well at baseline today.  Denies any shortness of breath, cough, hemoptysis, back pain, headache. He quitted smoking 2015.  Not currently actively drinking alcohol as well. Patient has chronic back pain unchanged.   # Liver cirrhosis with small amount of ascites/splenomegaly patient sees gastroenterology Dr. [name].  . He will get ultrasound surveillance due in February 2021 for screening of [place].  EGD surveillance for Barrett's plan in April 2021.  INTERVAL HISTORY Henry Moore is a 59 y.o. male who has above history reviewed by me today presents for follow up visit for management of stage IV RCC Problems and complaints are listed below: Patient reports feeling well.  No new complaints. Blood glucose level is better controlled after Metformin dose was adjusted to 500 mg twice daily. Patient is status post cycle 1 nivolumab and ipilimumab 1 week ago.    Review of Systems  Constitutional: Positive for fatigue. Negative for appetite change, chills, diaphoresis, fever and unexpected weight change.  HENT:   Negative for hearing loss, lump/mass, nosebleeds, sore throat and voice change.   Eyes: Negative for eye problems and icterus.  Respiratory: Negative for chest tightness, cough, hemoptysis, shortness of breath and wheezing.   Cardiovascular: Negative for chest pain and leg swelling.  Gastrointestinal: Negative for abdominal distention, abdominal pain, blood in stool, diarrhea, nausea and rectal pain.  Endocrine: Negative for hot flashes.  Genitourinary: Negative for bladder incontinence, difficulty urinating, dysuria, frequency, hematuria and nocturia.   Musculoskeletal: Positive for back pain. Negative for arthralgias, flank pain, gait problem and myalgias.  Skin: Negative for itching and rash.  Neurological: Negative for dizziness, gait problem, headaches, light-headedness, numbness and seizures.  Hematological: Negative for adenopathy. Does not bruise/bleed easily.  Psychiatric/Behavioral: Negative for confusion and decreased concentration. The patient is not nervous/anxious.     MEDICAL HISTORY:  Past Medical History:  Diagnosis Date  . Cough    SINUS DRAINAGE CAUSING COUGHING , REPORTS CLEAR MUCOUS   . Diabetes mellitus without complication ([place])   . HTN (hypertension)   . Hyperlipemia   . Left renal mass   . Platelets decreased ([place])    DENIES UNSUAL BLEEDING   . PONV (postoperative nausea and vomiting)   . Renal cell carcinoma ([place]) 08/04/2019  . WBC decreased     SURGICAL HISTORY: Past Surgical History:  Procedure Laterality Date  . ANKLE SURGERY Left   . ANTERIOR CERVICAL DECOMP/DISCECTOMY FUSION N/A 04/16/2014   Procedure: ANTERIOR CERVICAL DECOMPRESSION/DISCECTOMY FUSION  (ACDF C5-C7)   (2 LEVELS)     ;  Surgeon: Melina Schools, MD;  Location: [place];  Service: Orthopedics;  Laterality: N/A;  . APPENDECTOMY    . BACK SURGERY    . ESOPHAGOGASTRODUODENOSCOPY (EGD) WITH PROPOFOL N/A 02/06/2019   Procedure: ESOPHAGOGASTRODUODENOSCOPY (EGD) WITH PROPOFOL;  Surgeon: [name], MD;  Location[facility] ENDOSCOPY;  Service: Gastroenterology;  Laterality: N/A;  . ESOPHAGOGASTRODUODENOSCOPY (EGD) WITH PROPOFOL N/A 03/04/2019   Procedure: ESOPHAGOGASTRODUODENOSCOPY (EGD) WITH PROPOFOL;  Surgeon: Lin Landsman, MD;  Location[facility] ENDOSCOPY;  Service: Gastroenterology;  Laterality: N/A;  . ESOPHAGOGASTRODUODENOSCOPY (EGD) WITH PROPOFOL N/A 04/22/2019   Procedure: ESOPHAGOGASTRODUODENOSCOPY (EGD) WITH PROPOFOL;  Surgeon:  [name], MD;  Location: [facility] ENDOSCOPY;  Service: Gastroenterology;  Laterality: N/A;  . FRACTURE SURGERY    . HYDROCELE EXCISION    . KNEE ARTHROSCOPY Bilateral   . LAPAROSCOPIC NEPHRECTOMY, HAND ASSISTED Left 10/16/2018   Procedure: LEFT HAND ASSISTED LAPAROSCOPIC NEPHRECTOMY;  Surgeon: Lucas Mallow, MD;  Location: [facility];  Service: Urology;  Laterality: Left;  . NASAL SINUS SURGERY     X 2  . 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 SHOULDER SURGERY Left     SOCIAL HISTORY: Social History   Socioeconomic History  . Marital status: Married    Spouse name: Not on file  . Number of children: Not on file  . Years of education: Not on file  . Highest education level: Not on file  Occupational History  . Not on file  Tobacco Use  . Smoking status: Former Smoker    Packs/day: 0.25    Years: 20.00    Pack years: 5.00    Quit date: 2015    Years since quitting: 6.0  . Smokeless tobacco: Never Used  Substance and Sexual Activity  . Alcohol use: Not Currently    Alcohol/week: 0.0 standard drinks    Comment: no alchol in 1 year  . Drug use: No  . Sexual activity: Not on file  Other Topics Concern  . Not on file  Social History Narrative  . Not on file   Social Determinants of Health   Financial Resource Strain:   . Difficulty of Paying Living Expenses: Not on file  Food Insecurity:   . Worried About Charity fundraiser in the Last Year: Not on file  . Ran Out of Food in the Last Year:  Not on file  Transportation Needs:   . Lack of Transportation (Medical): Not on file  . Lack of Transportation (Non-Medical): Not on file  Physical Activity:   . Days of Exercise per Week: Not on file  . Minutes of Exercise per Session: Not on file  Stress:   . Feeling of Stress : Not on file  Social Connections:   . Frequency of Communication with Friends and Family: Not on file  . Frequency of Social Gatherings with Friends and Family: Not on file  . Attends Religious  Services: Not on file  . Active Member of Clubs or Organizations: Not on file  . Attends Archivist Meetings: Not on file  . Marital Status: Not on file  Intimate Partner Violence:   . Fear of Current or Ex-Partner: Not on file  . Emotionally Abused: Not on file  . Physically Abused: Not on file  . Sexually Abused: Not on file    FAMILY HISTORY: Family History  Problem Relation Age of Onset  . Diabetes Mother   . Cancer Mother        neuroendocrine cancer  . Cancer Father        brain  . Cancer Maternal Grandmother        stomcah  . Alzheimer's disease Paternal Grandmother   . Lung cancer Paternal Grandfather     ALLERGIES:  is allergic to codeine and mucinex [guaifenesin er].  MEDICATIONS:  Current Outpatient Medications  Medication Sig Dispense Refill  . ACCU-CHEK AVIVA PLUS test strip CHECK BL00D SUGAR ONCE OR TWICE DAILY. (Patient taking differently: 1 each by Other route as needed. ) 100 each PRN  . amLODipine (NORVASC) 10 MG tablet Take 10 mg by mouth daily.    . carvedilol (COREG) 25 MG tablet Take 1 tablet (25 mg total) by mouth 2 (two) times daily with a meal. 180 tablet 3  . glipiZIDE (GLUCOTROL) 5 MG tablet Take 1 tablet (5 mg total) by mouth daily before breakfast. 90 tablet 3  . hydrochlorothiazide (HYDRODIURIL) 25 MG tablet TAKE 1 TABLET DAILY. 90 tablet 0  . losartan (COZAAR) 100 MG tablet Take 1 tablet by mouth daily. 90 tablet 0  . lovastatin (MEVACOR) 40 MG tablet Take 1 tablet (40 mg total) by mouth at bedtime. 60 tablet 0  . metFORMIN (GLUCOPHAGE-XR) 500 MG 24 hr tablet Take 500 mg by mouth 2 (two) times daily.     . prochlorperazine (COMPAZINE) 10 MG tablet Take 1 tablet (10 mg total) by mouth every 6 (six) hours as needed for nausea or vomiting. 30 tablet 0   No current facility-administered medications for this visit.   Facility-Administered Medications Ordered in Other Visits  Medication Dose Route Frequency Provider Last Rate Last Admin    . ipilimumab (YERVOY) 100 mg in sodium chloride 0.9 % 50 mL chemo infusion  100 mg Intravenous Once Earlie Server, MD      . nivolumab (OPDIVO) 340 mg in sodium chloride 0.9 % 100 mL chemo infusion  340 mg Intravenous Once Earlie Server, MD 268 mL/hr at 09/04/19 1027 340 mg at 09/04/19 1027     PHYSICAL EXAMINATION: ECOG PERFORMANCE STATUS: 1 - Symptomatic but completely ambulatory Vitals:   09/04/19 0836  BP: (!) 155/79  Pulse: 77  Resp: 16  Temp: (!) 96.7 F (35.9 C)   Filed Weights   09/04/19 0836  Weight: 245 lb 3.2 oz (111.2 kg)    Physical Exam Constitutional:      General: He  is not in acute distress.    Appearance: He is obese.  HENT:     Head: Normocephalic and atraumatic.  Eyes:     General: No scleral icterus.    Pupils: Pupils are equal, round, and reactive to light.  Cardiovascular:     Rate and Rhythm: Normal rate and regular rhythm.     Heart sounds: Normal heart sounds.  Pulmonary:     Effort: Pulmonary effort is normal. No respiratory distress.     Breath sounds: No wheezing.  Abdominal:     General: Bowel sounds are normal. There is no distension.     Palpations: Abdomen is soft. There is no mass.     Tenderness: There is no abdominal tenderness.  Musculoskeletal:        General: No deformity. Normal range of motion.     Cervical back: Normal range of motion and neck supple.  Skin:    General: Skin is warm and dry.     Findings: No erythema or rash.  Neurological:     Mental Status: He is alert and oriented to person, place, and time.     Cranial Nerves: No cranial nerve deficit.     Coordination: Coordination normal.  Psychiatric:        Mood and Affect: Mood normal.        Behavior: Behavior normal.        Thought Content: Thought content normal.     LABORATORY DATA:  I have reviewed the data as listed Lab Results  Component Value Date   WBC 2.9 (L) 09/04/2019   HGB 10.3 (L) 09/04/2019   HCT 31.5 (L) 09/04/2019   MCV 91.8 09/04/2019   PLT 77  (L) 09/04/2019   Recent Labs    08/14/19 0809 08/21/19 0843 09/04/19 0804  NA 135 134* 134*  K 4.6 4.3 4.3  CL 106 105 104  CO2 20* 19* 20*  GLUCOSE 220* 180* 117*  BUN 29* 32* 26*  CREATININE 1.87* 1.77* 1.67*  CALCIUM 8.7* 9.1 8.9  GFRNONAA 39* 42* 45*  GFRAA 46* 49* 52*  PROT 7.9 8.2* 8.0  ALBUMIN 4.2 4.3 4.2  AST 35 28 27  ALT 31 23 21   ALKPHOS 128* 135* 114  BILITOT 1.0 1.0 0.9   Iron/TIBC/Ferritin/ %Sat    Component Value Date/Time   IRON 61 08/21/2019 0843   IRON 105 10/15/2018 1325   TIBC 284 08/21/2019 0843   TIBC 244 (L) 10/15/2018 1325   FERRITIN 237 08/21/2019 0843   FERRITIN 588 (H) 10/15/2018 1325   IRONPCTSAT 22 08/21/2019 0843   IRONPCTSAT 43 10/15/2018 1325      RADIOGRAPHIC STUDIES: I have personally reviewed the radiological images as listed and agreed with the findings in the report.  NM Bone Scan Whole Body  Result Date: 08/11/2019 CLINICAL DATA:  Renal cell carcinoma. EXAM: NUCLEAR MEDICINE WHOLE BODY BONE SCAN TECHNIQUE: Whole body anterior and posterior images were obtained approximately 3 hours after intravenous injection of radiopharmaceutical. RADIOPHARMACEUTICALS:  23 mCi Technetium-26m MDP IV COMPARISON:  CT chest, abdomen, and pelvis 07/14/2019 FINDINGS: No abnormal foci of increased or decreased radiotracer uptake identified to suggest osseous metastasis. Normal physiologic activity within the right kidney and bladder. Left kidney surgically absent. IMPRESSION: 1. No findings to suggest bone metastases. 2. Left nephrectomy. Electronically Signed   By: Kerby Moors M.D.   On: 08/11/2019 16:06      ASSESSMENT & PLAN:  1. Renal cell carcinoma, unspecified laterality ([place])   2.  Encounter for antineoplastic immunotherapy   3. Other cirrhosis of liver ([place])   4. 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 Patient tolerates nivolumab and ipilimumab treatments. Labs were reviewed and discussed with patient Counts are stable. Proceed with cycle 2 nivolumab and ipilimumab.  #Chronic problems include cirrhosis of liver, splenomegaly Liver function is stable.  Continue follow-up with gastroenterology. 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  Counts are stable.  #Anemia multifactorial, likely secondary to cancer, chronic kidney disease.  Folate level normal.  Iron level normal.  Vitamin B12 352. Refer to palliative service to establish care. Follow-up 3 weeks lab MD for assessment evaluation prior to the next cycle of ipilimumab and nivolumab.   All questions were answered. The patient knows to call the clinic with any problems questions or concerns.  Cc Albina Billet, MD   Earlie Server, MD, PhD Hematology Oncology [facility] Pager- [phone] 09/04/2019

## 2019-09-04 NOTE — Progress Notes (Signed)
Patient has increased Metformin to bid as instructed by PCP.

## 2019-09-08 ENCOUNTER — Encounter: Payer: Self-pay | Admitting: Oncology

## 2019-09-08 ENCOUNTER — Encounter (HOSPITAL_COMMUNITY): Payer: Self-pay | Admitting: Oncology

## 2019-09-10 ENCOUNTER — Telehealth: Payer: Self-pay

## 2019-09-10 DIAGNOSIS — C649 Malignant neoplasm of unspecified kidney, except renal pelvis: Secondary | ICD-10-CM

## 2019-09-10 NOTE — Telephone Encounter (Signed)
Patient schedule with genetic counselor on 2/11.

## 2019-09-10 NOTE — Telephone Encounter (Signed)
-----   Message from Earlie Server, MD sent at 09/09/2019  5:14 PM EST ----- Please refer him to genetic counselor. 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 Henry Moore I talked to the patient and he is aware about that and agreeswith the plan.

## 2019-09-11 ENCOUNTER — Inpatient Hospital Stay: Payer: BC Managed Care – PPO | Attending: Licensed Clinical Social Worker | Admitting: Licensed Clinical Social Worker

## 2019-09-11 ENCOUNTER — Encounter: Payer: Self-pay | Admitting: Licensed Clinical Social Worker

## 2019-09-11 DIAGNOSIS — Z803 Family history of malignant neoplasm of breast: Secondary | ICD-10-CM

## 2019-09-11 DIAGNOSIS — Z800 Family history of malignant neoplasm of digestive organs: Secondary | ICD-10-CM | POA: Insufficient documentation

## 2019-09-11 DIAGNOSIS — C649 Malignant neoplasm of unspecified kidney, except renal pelvis: Secondary | ICD-10-CM

## 2019-09-11 NOTE — Progress Notes (Signed)
REFERRING PROVIDER: Earlie Server, MD [place],  [place] [address]  PRIMARY PROVIDER:  Albina Billet, MD  PRIMARY REASON FOR VISIT:  1. Family history of breast cancer   2. Family history of colon cancer   3. Family history of stomach cancer    I connected with Henry Moore and his sister, Henry Moore,  on 09/11/2019 at 1:00 PM EDT by MyChart video conference and verified that I am speaking with the correct person using two identifiers.    Patient location: home Provider location: office  HISTORY OF PRESENT ILLNESS:   Henry Moore, a 59 y.o. male, was seen for a [place] cancer genetics consultation at the request of Dr. 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 Henry Moore presents to clinic today to discuss the possibility of a hereditary predisposition to cancer, genetic testing, and to further clarify his future cancer risks, as well as potential cancer risks for family members.   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 This was treated with nephrectomy and chemotherapy. 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 Patient has not had a colonoscopy. He had prostate cancer screening this year that was normal. He denies history of hemangioblastoma, pancreatic/kidney cysts, pancreatic neuroendocrine tumors and pheochromocytoma.    CANCER HISTORY:  Oncology History  Renal cell carcinoma ([place])  08/04/2019 Initial Diagnosis   Renal cell carcinoma ([place])   08/14/2019 -  Chemotherapy   The patient had ipilimumab (YERVOY) 100 mg in sodium chloride 0.9 % 50 mL chemo infusion, 0.9 mg/kg = 110 mg, Intravenous,  Once, 2 of 4 cycles Administration: 100 mg (08/14/2019), 100 mg (09/04/2019) nivolumab (OPDIVO) 340 mg in sodium chloride 0.9 % 100 mL chemo infusion, 334 mg, Intravenous, Once, 2 of 6 cycles Administration: 340 mg (08/14/2019), 340  mg (09/04/2019)  for chemotherapy treatment.      Past Medical History:  Diagnosis Date  . Cough    SINUS DRAINAGE CAUSING COUGHING , REPORTS CLEAR MUCOUS   . Diabetes mellitus without complication ([place])   . Family history of breast cancer   . Family history of colon cancer   . Family history of stomach cancer   . HTN (hypertension)   . Hyperlipemia   . Left renal mass   . Platelets decreased ([place])    DENIES UNSUAL BLEEDING   . PONV (postoperative nausea and vomiting)   . Renal cell carcinoma ([place]) 08/04/2019  . WBC decreased     Past Surgical History:  Procedure Laterality Date  . ANKLE SURGERY Left   . ANTERIOR CERVICAL DECOMP/DISCECTOMY FUSION N/A 04/16/2014   Procedure: ANTERIOR CERVICAL DECOMPRESSION/DISCECTOMY FUSION  (ACDF C5-C7)   (2 LEVELS)     ;  Surgeon: Melina Schools, MD;  Location: [facility];  Service: Orthopedics;  Laterality: N/A;  . APPENDECTOMY    . BACK SURGERY    . ESOPHAGOGASTRODUODENOSCOPY (EGD) WITH PROPOFOL N/A 02/06/2019   Procedure: ESOPHAGOGASTRODUODENOSCOPY (EGD) WITH PROPOFOL;  Surgeon: [name], MD;  Location: [facility] ENDOSCOPY;  Service: Gastroenterology;  Laterality: N/A;  . ESOPHAGOGASTRODUODENOSCOPY (EGD) WITH PROPOFOL N/A 03/04/2019   Procedure: ESOPHAGOGASTRODUODENOSCOPY (EGD) WITH PROPOFOL;  Surgeon: Lin Landsman, MD;  Location: [facility] ENDOSCOPY;  Service: Gastroenterology;  Laterality: N/A;  . ESOPHAGOGASTRODUODENOSCOPY (EGD) WITH PROPOFOL N/A 04/22/2019   Procedure: ESOPHAGOGASTRODUODENOSCOPY (EGD) WITH PROPOFOL;  Surgeon: [name],  Bailey Mech, MD;  Location: [facility];  Service: Gastroenterology;  Laterality: N/A;  . FRACTURE SURGERY    . HYDROCELE EXCISION    . KNEE ARTHROSCOPY Bilateral   . LAPAROSCOPIC NEPHRECTOMY, HAND ASSISTED Left 10/16/2018   Procedure: LEFT HAND ASSISTED LAPAROSCOPIC NEPHRECTOMY;  Surgeon: Lucas Mallow, MD;  Location: [facility];  Service: Urology;  Laterality: Left;  . NASAL SINUS SURGERY     X 2  . PENILE PROSTHESIS  IMPLANT  10 YEARS AGO  . SHOULDER SURGERY Left     Social History   Socioeconomic History  . Marital status: Married    Spouse name: Not on file  . Number of children: Not on file  . Years of education: Not on file  . Highest education level: Not on file  Occupational History  . Not on file  Tobacco Use  . Smoking status: Former Smoker    Packs/day: 0.25    Years: 20.00    Pack years: 5.00    Quit date: 2015    Years since quitting: 6.1  . Smokeless tobacco: Never Used  Substance and Sexual Activity  . Alcohol use: Not Currently    Alcohol/week: 0.0 standard drinks    Comment: no alchol in 1 year  . Drug use: No  . Sexual activity: Not on file  Other Topics Concern  . Not on file  Social History Narrative  . Not on file   Social Determinants of Health   Financial Resource Strain:   . Difficulty of Paying Living Expenses: Not on file  Food Insecurity:   . Worried About Charity fundraiser in the Last Year: Not on file  . Ran Out of Food in the Last Year: Not on file  Transportation Needs:   . Lack of Transportation (Medical): Not on file  . Lack of Transportation (Non-Medical): Not on file  Physical Activity:   . Days of Exercise per Week: Not on file  . Minutes of Exercise per Session: Not on file  Stress:   . Feeling of Stress : Not on file  Social Connections:   . Frequency of Communication with Friends and Family: Not on file  . Frequency of Social Gatherings with Friends and Family: Not on file  . Attends Religious Services: Not on file  . Active Member of Clubs or Organizations: Not on file  . Attends Archivist Meetings: Not on file  . Marital Status: Not on file     FAMILY HISTORY:  We obtained a detailed, 4-generation family history.  Significant diagnoses are listed below: Family History  Problem Relation Age of Onset  . Diabetes Mother   . Cancer Mother        neuroendocrine cancer  . Cancer Father        neuroendocrine cancer of  meninges  . Cancer Maternal Grandmother        tomach dx 66  . Alzheimer's disease Paternal Grandmother   . Lung cancer Paternal Grandfather   . Lung cancer Maternal Aunt   . Colon cancer Maternal Uncle   . Breast cancer Paternal Aunt        both dx 50s  . Ovarian cancer Other        dx 29  . Breast cancer Cousin    Henry Moore has a son, 48 and a daughter, 5, no history of cancer. He has a sister, Henry Moore, who is 64 and a brother, Henry Moore, 34, neither have had cancer.   Patient's mother  was diagnosed with a neuroendocrine tumor the sigmoid colon at 18 and is living at 17. Patient has 2 maternal uncles, 4 maternal aunts. One uncle had colon cancer and died at 61. An aunt had lung cancer and died in her 66s. A maternal cousin had breast cancer at 32. Maternal grandmother had stomach cancer at 61 and died at 73. Maternal grandfather died at 78, no cancer.  Patient's father was also diagnosed with a neuroendocrine tumor, this was of the meninges. He passed at 63. Patient has 1 full paternal uncle, no cancers, he died at age 33. Patient also has 2 paternal half aunts and 1 paternal half uncle through his grandmother. Both of these aunts had breast cancer in their 30s. His paternal grandmother died at 73 and had a sister who had ovarian cancer in her 24s. Patient also has a first cousin once removed on this side who had breast cancer in her 48s. Paternal grandfather died of lung cancer at 54.   Henry Moore is unaware of previous family history of genetic testing for hereditary cancer risks. There is no reported Ashkenazi Jewish ancestry. There is no known consanguinity.  GENETIC COUNSELING ASSESSMENT: Henry Moore is a 59 y.o. male with a personal and family history which is somewhat suggestive of a hereditary cancer syndrome, such as Von Hippel Lindau (VHL), or HBOC given the family history of breast and ovarian, and predisposition to cancer. We, therefore, discussed and recommended the following at today's  visit.   DISCUSSION: We discussed that 5 - 10% of cancer is hereditary. We discussed that his tumor testing revealed a mutation in a gene called VHL, and that is associated with a condition called Von Hippel Lindau. This condition is associated with clear cell RCC, pancreatic/kidney cysts, pancreatic neuroendocrine tumors, pheochromocytomas and meningiomas. This result suggests he could have this condition, or the VHL mutation may just be in his tumor. He denies personal and family history of the other findings associated with VHL (aside from his kidney cancer). We also discussed that there are genes associated with hereditary breast, ovarian and colon cancers given his family history. We discussed that testing is beneficial for several reasons including knowing how to follow individuals for cancer screenings and understand if other family members could be at risk for cancer and allow them to undergo genetic testing.   We reviewed the characteristics, features and inheritance patterns of hereditary cancer syndromes. We also discussed genetic testing, including the appropriate family members to test, the process of testing, insurance coverage and turn-around-time for results. We discussed the implications of a negative, positive and/or variant of uncertain significant result. We recommended Henry Moore pursue genetic testing for the Multi-Cancer gene panel.   The Multi-Cancer Panel offered by [facility] includes sequencing and/or deletion duplication testing of the following 85 genes: AIP, ALK, APC, ATM, AXIN2,BAP1,  BARD1, BLM, BMPR1A, BRCA1, BRCA2, BRIP1, CASR, CDC73, CDH1, CDK4, CDKN1B, CDKN1C, CDKN2A (p14ARF), CDKN2A (p16INK4a), CEBPA, CHEK2, CTNNA1, DICER1, DIS3L2, EGFR (c.2369C>T, p.Thr790Met variant only), EPCAM (Deletion/duplication testing only), FH, FLCN, GATA2, GPC3, GREM1 (Promoter region deletion/duplication testing only), HOXB13 (c.251G>A, p.Gly84Glu), HRAS, KIT, MAX, MEN1, MET, MITF (c.952G>A,  p.Glu318Lys variant only), MLH1, MSH2, MSH3, MSH6, MUTYH, NBN, NF1, NF2, NTHL1, PALB2, PDGFRA, PHOX2B, PMS2, POLD1, POLE, POT1, PRKAR1A, PTCH1, PTEN, RAD50, RAD51C, RAD51D, RB1, RECQL4, RET, RNF43, RUNX1, SDHAF2, SDHA (sequence changes only), SDHB, SDHC, SDHD, SMAD4, SMARCA4, SMARCB1, SMARCE1, STK11, SUFU, TERC, TERT, TMEM127, TP53, TSC1, TSC2, VHL, WRN and WT1.   Based on Henry Moore personal  and family history of cancer, he meets medical criteria for genetic testing. Despite that he meets criteria, he may still have an out of pocket cost.   PLAN: After considering the risks, benefits, and limitations, Henry Moore provided informed consent to pursue genetic testing. He will have blood drawn at his next lab appointment, 2/25 and the blood sample will be sent to [facility] for analysis of the Multi-Cancer Panel. Results should be available within approximately 2-3 weeks' time from his blood draw, at which point they will be disclosed by telephone to Henry Moore, as will any additional recommendations warranted by these results. Henry Moore will receive a summary of his genetic counseling visit and a copy of his results once available. This information will also be available in Epic.   Henry Moore questions were answered to his satisfaction today. Our contact information was provided should additional questions or concerns arise. Thank you for the referral and allowing [place] to share in the care of your patient.   Faith Rogue, MS, [facility] Genetic Counselor [email or web address] Phone: [phone]  The patient was seen for a total of 45 minutes in face-to-face genetic counseling.  Drs. Magrinat, Lindi Adie and/or Burr Medico were available for discussion regarding this case.   _______________________________________________________________________ For Office Staff:  Number of people involved in session: 2 Was an Intern/ student involved with case: no

## 2019-09-25 ENCOUNTER — Other Ambulatory Visit: Payer: Self-pay

## 2019-09-25 ENCOUNTER — Inpatient Hospital Stay: Payer: BC Managed Care – PPO

## 2019-09-25 ENCOUNTER — Encounter: Payer: Self-pay | Admitting: Oncology

## 2019-09-25 ENCOUNTER — Inpatient Hospital Stay (HOSPITAL_BASED_OUTPATIENT_CLINIC_OR_DEPARTMENT_OTHER): Payer: BC Managed Care – PPO | Admitting: Oncology

## 2019-09-25 VITALS — BP 108/68 | HR 71 | Temp 95.7°F | Resp 18 | Wt 246.3 lb

## 2019-09-25 DIAGNOSIS — C649 Malignant neoplasm of unspecified kidney, except renal pelvis: Secondary | ICD-10-CM

## 2019-09-25 DIAGNOSIS — Z5112 Encounter for antineoplastic immunotherapy: Secondary | ICD-10-CM | POA: Diagnosis not present

## 2019-09-25 DIAGNOSIS — K7469 Other cirrhosis of liver: Secondary | ICD-10-CM

## 2019-09-25 DIAGNOSIS — D696 Thrombocytopenia, unspecified: Secondary | ICD-10-CM

## 2019-09-25 DIAGNOSIS — C642 Malignant neoplasm of left kidney, except renal pelvis: Secondary | ICD-10-CM | POA: Diagnosis not present

## 2019-09-25 DIAGNOSIS — D649 Anemia, unspecified: Secondary | ICD-10-CM

## 2019-09-25 DIAGNOSIS — D708 Other neutropenia: Secondary | ICD-10-CM

## 2019-09-25 LAB — CBC WITH DIFFERENTIAL/PLATELET
Abs Immature Granulocytes: 0.01 10*3/uL (ref 0.00–0.07)
Basophils Absolute: 0 10*3/uL (ref 0.0–0.1)
Basophils Relative: 2 %
Eosinophils Absolute: 0.2 10*3/uL (ref 0.0–0.5)
Eosinophils Relative: 8 %
HCT: 28.5 % — ABNORMAL LOW (ref 39.0–52.0)
Hemoglobin: 9.6 g/dL — ABNORMAL LOW (ref 13.0–17.0)
Immature Granulocytes: 0 %
Lymphocytes Relative: 23 %
Lymphs Abs: 0.5 10*3/uL — ABNORMAL LOW (ref 0.7–4.0)
MCH: 31.1 pg (ref 26.0–34.0)
MCHC: 33.7 g/dL (ref 30.0–36.0)
MCV: 92.2 fL (ref 80.0–100.0)
Monocytes Absolute: 0.3 10*3/uL (ref 0.1–1.0)
Monocytes Relative: 13 %
Neutro Abs: 1.2 10*3/uL — ABNORMAL LOW (ref 1.7–7.7)
Neutrophils Relative %: 54 %
Platelets: 73 10*3/uL — ABNORMAL LOW (ref 150–400)
RBC: 3.09 MIL/uL — ABNORMAL LOW (ref 4.22–5.81)
RDW: 14 % (ref 11.5–15.5)
WBC: 2.3 10*3/uL — ABNORMAL LOW (ref 4.0–10.5)
nRBC: 0 % (ref 0.0–0.2)

## 2019-09-25 LAB — COMPREHENSIVE METABOLIC PANEL
ALT: 23 U/L (ref 0–44)
AST: 32 U/L (ref 15–41)
Albumin: 4.1 g/dL (ref 3.5–5.0)
Alkaline Phosphatase: 101 U/L (ref 38–126)
Anion gap: 11 (ref 5–15)
BUN: 40 mg/dL — ABNORMAL HIGH (ref 6–20)
CO2: 21 mmol/L — ABNORMAL LOW (ref 22–32)
Calcium: 8.8 mg/dL — ABNORMAL LOW (ref 8.9–10.3)
Chloride: 104 mmol/L (ref 98–111)
Creatinine, Ser: 1.71 mg/dL — ABNORMAL HIGH (ref 0.61–1.24)
GFR calc Af Amer: 51 mL/min — ABNORMAL LOW (ref >60)
GFR calc non Af Amer: 44 mL/min — ABNORMAL LOW (ref >60)
Glucose, Bld: 117 mg/dL — ABNORMAL HIGH (ref 70–99)
Potassium: 4.4 mmol/L (ref 3.5–5.1)
Sodium: 136 mmol/L (ref 135–145)
Total Bilirubin: 1 mg/dL (ref 0.3–1.2)
Total Protein: 8 g/dL (ref 6.5–8.1)

## 2019-09-25 MED ORDER — SODIUM CHLORIDE 0.9 % IV SOLN
100.0000 mg | Freq: Once | INTRAVENOUS | Status: AC
  Administered 2019-09-25: 100 mg via INTRAVENOUS
  Filled 2019-09-25: qty 20

## 2019-09-25 MED ORDER — DIPHENHYDRAMINE HCL 50 MG/ML IJ SOLN
25.0000 mg | Freq: Once | INTRAMUSCULAR | Status: AC
  Administered 2019-09-25: 25 mg via INTRAVENOUS
  Filled 2019-09-25: qty 1

## 2019-09-25 MED ORDER — SODIUM CHLORIDE 0.9 % IV SOLN
Freq: Once | INTRAVENOUS | Status: AC
  Filled 2019-09-25: qty 250

## 2019-09-25 MED ORDER — SODIUM CHLORIDE 0.9 % IV SOLN
340.0000 mg | Freq: Once | INTRAVENOUS | Status: AC
  Administered 2019-09-25: 340 mg via INTRAVENOUS
  Filled 2019-09-25: qty 10

## 2019-09-25 MED ORDER — FAMOTIDINE IN NACL 20-0.9 MG/50ML-% IV SOLN
20.0000 mg | Freq: Once | INTRAVENOUS | Status: AC
  Administered 2019-09-25: 20 mg via INTRAVENOUS
  Filled 2019-09-25: qty 50

## 2019-09-25 NOTE — Progress Notes (Signed)
Per MD and treatment team to continue with treatment today in regards to pt.'s recent labs.   Evea Sheek [employer]

## 2019-09-25 NOTE — Progress Notes (Signed)
Patient here for follow up. Pt reports that his blood sugar is "all over the place." He had been taking medications for 15 years and his A1C has always around 5 and now it has been unstable. He is now on Humalog insulin.

## 2019-09-25 NOTE — Progress Notes (Signed)
Hematology/Oncology follow-up [facility] Telephone:([phone] [phone]   Patient Care Team: Albina Billet, MD as PCP - General (Internal Medicine) Earlie Server, MD as Consulting Physician (Hematology and Oncology)  REFERRING PROVIDER: Albina Billet, MD  CHIEF COMPLAINTS/REASON FOR VISIT:  Follow-up for kidney cancer  HISTORY OF PRESENTING ILLNESS:   Henry Moore is a  59 y.o.  male with PMH listed below was seen in consultation at the request of  Albina Billet, MD  for evaluation of kidney cancer Extensive medical records were reviewed Patient had MRI lumbar spine without contrast done on 07/13/2018 which showed mired lumbar degenerative disease. CT thoracic spine was done on 08/05/2018 which showed 5 cm left renal mass. 09/04/2018 CT abdomen and pelvis with and without contrast showed 5 cm round enhancing solid mass in the left kidney.  No involvement of left renal vein.  No lymphadenopathy Morphologic changes consistent with cirrhosis and the portal hypertension.  Small volume ascites and splenomegaly. . 10/16/2018 Left nephrectomy showed renal cell carcinoma, clear-cell type, nuclear grade 4, tumor extends into the renal vein and renal sinus fat.  Urethral, vascular and or resection margins are negative for tumor pT3a Nx Mx  01/28/2019 patient had another CT chest abdomen pelvis done with contrast Showed interval left nephrectomy.  Scattered tiny pulmonary nodules bilaterally.  Nonspecific.  No other evidence of metastatic disease.  Cirrhosis changes extensive coronary and aortic atherosclerosis  07/14/2019 patient underwent surveillance CT chest abdomen pelvis without contrast Interval progression of pulmonary metastasis with new enlarged right paratracheal lymph node 1.7 cm, previously 0.6 cm one-point since worrisome for metastatic adenopathy.  Multiple progressive pulmonary nodules, index nodule within the lingula measures 1.3 cm, previously 3  mm. Index subpleural nodule within the anterior right upper lobe measuring 1.8 cm, previously 3 cm Index nodule within the post lateral right lower lobe measuring 5 mm, new from previous care Aortic sclerosis.  Three-vessel coronary artery calcification noted.  Cirrhosis changes.  Splenomegaly.  Patient was referred to establish care with medical oncology for further discussion and evaluation of metastatic renal cell carcinoma. Patient reports feeling well at baseline today.  Denies any shortness of breath, cough, hemoptysis, back pain, headache. He quitted smoking 2015.  Not currently actively drinking alcohol as well. Patient has chronic back pain unchanged.   # Liver cirrhosis with small amount of ascites/splenomegaly patient sees gastroenterology Dr. [name].  . He will get ultrasound surveillance due in February 2021 for screening of [place].  EGD surveillance for Barrett's plan in April 2021.  INTERVAL HISTORY Henry Moore is a 59 y.o. male who has above history reviewed by me today presents for follow up visit for management of stage IV RCC 0.1 Problems and complaints are listed below: During interval, patient has started  Humalog sliding scale onto Metformin and glipizide for better control of his  This morning, blood glucose was 117. He otherwise feels well.  No new complaints. Status post 2 cycles of nivolumab and ipilimumab     Review of Systems  Constitutional: Positive for fatigue. Negative for appetite change, chills, fever and unexpected weight change.  HENT:   Negative for hearing loss and voice change.   Eyes: Negative for eye problems and icterus.  Respiratory: Negative for chest tightness, cough and shortness of breath.   Cardiovascular: Negative for chest pain and leg swelling.  Gastrointestinal: Negative for abdominal distention and abdominal pain.  Endocrine: Negative for hot flashes.  Genitourinary: Negative for difficulty urinating, dysuria  and frequency.    Musculoskeletal: Positive for back pain. Negative for arthralgias.  Skin: Negative for itching and rash.  Neurological: Negative for light-headedness and numbness.  Hematological: Negative for adenopathy. Does not bruise/bleed easily.  Psychiatric/Behavioral: Negative for confusion.    MEDICAL HISTORY:  Past Medical History:  Diagnosis Date  . Cough    SINUS DRAINAGE CAUSING COUGHING , REPORTS CLEAR MUCOUS   . Diabetes mellitus without complication ([place])   . Family history of breast cancer   . Family history of colon cancer   . Family history of stomach cancer   . HTN (hypertension)   . Hyperlipemia   . Left renal mass   . Platelets decreased ([place])    DENIES UNSUAL BLEEDING   . PONV (postoperative nausea and vomiting)   . Renal cell carcinoma ([place]) 08/04/2019  . WBC decreased     SURGICAL HISTORY: Past Surgical History:  Procedure Laterality Date  . ANKLE SURGERY Left   . ANTERIOR CERVICAL DECOMP/DISCECTOMY FUSION N/A 04/16/2014   Procedure: ANTERIOR CERVICAL DECOMPRESSION/DISCECTOMY FUSION  (ACDF C5-C7)   (2 LEVELS)     ;  Surgeon: Melina Schools, MD;  Location: [place];  Service: Orthopedics;  Laterality: N/A;  . APPENDECTOMY    . BACK SURGERY    . ESOPHAGOGASTRODUODENOSCOPY (EGD) WITH PROPOFOL N/A 02/06/2019   Procedure: ESOPHAGOGASTRODUODENOSCOPY (EGD) WITH PROPOFOL;  Surgeon: [name], MD;  Location: [facility] ENDOSCOPY;  Service: Gastroenterology;  Laterality: N/A;  . ESOPHAGOGASTRODUODENOSCOPY (EGD) WITH PROPOFOL N/A 03/04/2019   Procedure: ESOPHAGOGASTRODUODENOSCOPY (EGD) WITH PROPOFOL;  Surgeon: Lin Landsman, MD;  Location[facility] ENDOSCOPY;  Service: Gastroenterology;  Laterality: N/A;  . ESOPHAGOGASTRODUODENOSCOPY (EGD) WITH PROPOFOL N/A 04/22/2019   Procedure: ESOPHAGOGASTRODUODENOSCOPY (EGD) WITH PROPOFOL;  Surgeon: [name], MD;  Location: [facility] ENDOSCOPY;  Service: Gastroenterology;  Laterality: N/A;  . FRACTURE SURGERY    . HYDROCELE EXCISION    . KNEE  ARTHROSCOPY Bilateral   . LAPAROSCOPIC NEPHRECTOMY, HAND ASSISTED Left 10/16/2018   Procedure: LEFT HAND ASSISTED LAPAROSCOPIC NEPHRECTOMY;  Surgeon: Lucas Mallow, MD;  Location: [facility];  Service: Urology;  Laterality: Left;  . NASAL SINUS SURGERY     X 2  . 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 SHOULDER SURGERY Left     SOCIAL HISTORY: Social History   Socioeconomic History  . Marital status: Married    Spouse name: Not on file  . Number of children: Not on file  . Years of education: Not on file  . Highest education level: Not on file  Occupational History  . Not on file  Tobacco Use  . Smoking status: Former Smoker    Packs/day: 0.25    Years: 20.00    Pack years: 5.00    Quit date: 2015    Years since quitting: 6.1  . Smokeless tobacco: Never Used  Substance and Sexual Activity  . Alcohol use: Not Currently    Alcohol/week: 0.0 standard drinks    Comment: no alchol in 1 year  . Drug use: No  . Sexual activity: Not on file  Other Topics Concern  . Not on file  Social History Narrative  . Not on file   Social Determinants of Health   Financial Resource Strain:   . Difficulty of Paying Living Expenses: Not on file  Food Insecurity:   . Worried About Charity fundraiser in the Last Year: Not on file  . Ran Out of Food in the Last Year: Not on file  Transportation  Needs:   . Lack of Transportation (Medical): Not on file  . Lack of Transportation (Non-Medical): Not on file  Physical Activity:   . Days of Exercise per Week: Not on file  . Minutes of Exercise per Session: Not on file  Stress:   . Feeling of Stress : Not on file  Social Connections:   . Frequency of Communication with Friends and Family: Not on file  . Frequency of Social Gatherings with Friends and Family: Not on file  . Attends Religious Services: Not on file  . Active Member of Clubs or Organizations: Not on file  . Attends Archivist Meetings: Not on file  . Marital  Status: Not on file  Intimate Partner Violence:   . Fear of Current or Ex-Partner: Not on file  . Emotionally Abused: Not on file  . Physically Abused: Not on file  . Sexually Abused: Not on file    FAMILY HISTORY: Family History  Problem Relation Age of Onset  . Diabetes Mother   . Cancer Mother        neuroendocrine cancer  . Cancer Father        neuroendocrine cancer of meninges  . Cancer Maternal Grandmother        tomach dx 60  . Alzheimer's disease Paternal Grandmother   . Lung cancer Paternal Grandfather   . Lung cancer Maternal Aunt   . Colon cancer Maternal Uncle   . Breast cancer Paternal Aunt        both dx 18s  . Ovarian cancer Other        dx 75  . Breast cancer Cousin     ALLERGIES:  is allergic to codeine and mucinex [guaifenesin er].  MEDICATIONS:  Current Outpatient Medications  Medication Sig Dispense Refill  . ACCU-CHEK AVIVA PLUS test strip CHECK BL00D SUGAR ONCE OR TWICE DAILY. (Patient taking differently: 1 each by Other route as needed. ) 100 each PRN  . amLODipine (NORVASC) 10 MG tablet Take 10 mg by mouth daily.    . carvedilol (COREG) 25 MG tablet Take 1 tablet (25 mg total) by mouth 2 (two) times daily with a meal. 180 tablet 3  . glipiZIDE (GLUCOTROL) 5 MG tablet Take 1 tablet (5 mg total) by mouth daily before breakfast. 90 tablet 3  . hydrochlorothiazide (HYDRODIURIL) 25 MG tablet TAKE 1 TABLET DAILY. 90 tablet 0  . insulin lispro (INSULIN LISPRO) 100 UNIT/ML KwikPen Junior Inject into the skin 3 (three) times daily as needed (PER SLIDING SCALE).    Marland Kitchen losartan (COZAAR) 100 MG tablet Take 1 tablet by mouth daily. 90 tablet 0  . lovastatin (MEVACOR) 40 MG tablet Take 1 tablet (40 mg total) by mouth at bedtime. 60 tablet 0  . metFORMIN (GLUCOPHAGE-XR) 500 MG 24 hr tablet Take 500 mg by mouth 2 (two) times daily.     . prochlorperazine (COMPAZINE) 10 MG tablet Take 1 tablet (10 mg total) by mouth every 6 (six) hours as needed for nausea or  vomiting. 30 tablet 0   No current facility-administered medications for this visit.     PHYSICAL EXAMINATION: ECOG PERFORMANCE STATUS: 1 - Symptomatic but completely ambulatory Vitals:   09/25/19 0836  BP: 108/68  Pulse: 71  Resp: 18  Temp: (!) 95.7 F (35.4 C)   Filed Weights   09/25/19 0836  Weight: 246 lb 4.8 oz (111.7 kg)    Physical Exam Constitutional:      General: He is not in acute distress.  Appearance: He is obese.  HENT:     Head: Normocephalic and atraumatic.  Eyes:     General: No scleral icterus.    Pupils: Pupils are equal, round, and reactive to light.  Cardiovascular:     Rate and Rhythm: Normal rate and regular rhythm.     Heart sounds: Normal heart sounds.  Pulmonary:     Effort: Pulmonary effort is normal. No respiratory distress.     Breath sounds: No wheezing.  Abdominal:     General: Bowel sounds are normal. There is no distension.     Palpations: Abdomen is soft. There is no mass.     Tenderness: There is no abdominal tenderness.  Musculoskeletal:        General: No deformity. Normal range of motion.     Cervical back: Normal range of motion and neck supple.  Skin:    General: Skin is warm and dry.     Findings: No erythema or rash.  Neurological:     Mental Status: He is alert and oriented to person, place, and time. Mental status is at baseline.     Cranial Nerves: No cranial nerve deficit.     Coordination: Coordination normal.  Psychiatric:        Mood and Affect: Mood normal.        Behavior: Behavior normal.        Thought Content: Thought content normal.     LABORATORY DATA:  I have reviewed the data as listed Lab Results  Component Value Date   WBC 2.3 (L) 09/25/2019   HGB 9.6 (L) 09/25/2019   HCT 28.5 (L) 09/25/2019   MCV 92.2 09/25/2019   PLT 73 (L) 09/25/2019   Recent Labs    08/21/19 0843 09/04/19 0804 09/25/19 0759  NA 134* 134* 136  K 4.3 4.3 4.4  CL 105 104 104  CO2 19* 20* 21*  GLUCOSE 180* 117*  117*  BUN 32* 26* 40*  CREATININE 1.77* 1.67* 1.71*  CALCIUM 9.1 8.9 8.8*  GFRNONAA 42* 45* 44*  GFRAA 49* 52* 51*  PROT 8.2* 8.0 8.0  ALBUMIN 4.3 4.2 4.1  AST 28 27 32  ALT 23 21 23   ALKPHOS 135* 114 101  BILITOT 1.0 0.9 1.0   Iron/TIBC/Ferritin/ %Sat    Component Value Date/Time   IRON 61 08/21/2019 0843   IRON 105 10/15/2018 1325   TIBC 284 08/21/2019 0843   TIBC 244 (L) 10/15/2018 1325   FERRITIN 237 08/21/2019 0843   FERRITIN 588 (H) 10/15/2018 1325   IRONPCTSAT 22 08/21/2019 0843   IRONPCTSAT 43 10/15/2018 1325      RADIOGRAPHIC STUDIES: I have personally reviewed the radiological images as listed and agreed with the findings in the report. MR Brain W Wo Contrast  Result Date: 08/05/2019 CLINICAL DATA:  History of renal cell carcinoma.  Staging. EXAM: MRI HEAD WITHOUT AND WITH CONTRAST TECHNIQUE: Multiplanar, multiecho pulse sequences of the brain and surrounding structures were obtained without and with intravenous contrast. CONTRAST:  22mL GADAVIST GADOBUTROL 1 MMOL/ML IV SOLN COMPARISON:  None. FINDINGS: Brain: Remote microhemorrhage in the left frontal lobe. No underlying edema, mass, or abnormal-nonspecific in isolation. No infarct, hydrocephalus, collection, or abnormal intracranial enhancement. Vascular: Normal flow voids and vascular enhancements Skull and upper cervical spine: Normal marrow signal. Sinuses/Orbits: Mild mucosal thickening scattered in paranasal sinuses with frothy secretions in the right sphenoid sinus. There has been prior endoscopic sinus surgery. IMPRESSION: No evidence of metastatic disease. Electronically Signed   By:  Monte Fantasia M.D.   On: 08/05/2019 08:52   NM Bone Scan Whole Body  Result Date: 08/11/2019 CLINICAL DATA:  Renal cell carcinoma. EXAM: NUCLEAR MEDICINE WHOLE BODY BONE SCAN TECHNIQUE: Whole body anterior and posterior images were obtained approximately 3 hours after intravenous injection of radiopharmaceutical.  RADIOPHARMACEUTICALS:  23 mCi Technetium-44m MDP IV COMPARISON:  CT chest, abdomen, and pelvis 07/14/2019 FINDINGS: No abnormal foci of increased or decreased radiotracer uptake identified to suggest osseous metastasis. Normal physiologic activity within the right kidney and bladder. Left kidney surgically absent. IMPRESSION: 1. No findings to suggest bone metastases. 2. Left nephrectomy. Electronically Signed   By: Kerby Moors M.D.   On: 08/11/2019 16:06     ASSESSMENT & PLAN:  1. Renal cell carcinoma, unspecified laterality ([place])   2. Encounter for antineoplastic immunotherapy   3. Other cirrhosis of liver ([place])   4. Thrombocytopenia ([place])   5. Normocytic anemia   6. 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 Patient tolerates nivolumab and ipilimumab treatments. Labs reviewed and discussed with patient. Counts acceptable to proceed with cycle 3 nivolumab treatment ipilimumab. Plan repeat imaging after 4 cycles of immunotherapy. 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 Liver functions are stable. ANC 1.2.  #Anemia secondary to chronic kidney disease, hemoglobin 9.6, slightly lower compared to last visit.  Continue to monitor. #Family history of cancer and personal history of RCC.  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  Results are pending.  Follow-up 3 weeks lab MD for assessment evaluation prior to the next cycle of ipilimumab and nivolumab. All questions were answered. The patient knows to call the clinic with any problems questions or concerns.  Cc Albina Billet, MD   Earlie Server, MD, PhD Hematology Oncology [facility] at [facility] Pager- [phone] 09/25/2019

## 2019-10-06 ENCOUNTER — Telehealth: Payer: Self-pay | Admitting: Licensed Clinical Social Worker

## 2019-10-06 NOTE — Telephone Encounter (Signed)
Revealed negative genetic testing.  Revealed that a VUS in [facility] was identified.  We discussed that we do not know why he has  cancer or why there is cancer in the family. It could be due to a different gene that we are not testing, or something our current technology cannot pick up.  It will be important for him to keep in contact with genetics to learn if additional testing may be needed in the future.

## 2019-10-07 ENCOUNTER — Encounter: Payer: Self-pay | Admitting: Licensed Clinical Social Worker

## 2019-10-07 ENCOUNTER — Ambulatory Visit: Payer: Self-pay | Admitting: Licensed Clinical Social Worker

## 2019-10-07 DIAGNOSIS — Z800 Family history of malignant neoplasm of digestive organs: Secondary | ICD-10-CM

## 2019-10-07 DIAGNOSIS — Z1379 Encounter for other screening for genetic and chromosomal anomalies: Secondary | ICD-10-CM | POA: Insufficient documentation

## 2019-10-07 DIAGNOSIS — Z803 Family history of malignant neoplasm of breast: Secondary | ICD-10-CM

## 2019-10-07 DIAGNOSIS — C649 Malignant neoplasm of unspecified kidney, except renal pelvis: Secondary | ICD-10-CM

## 2019-10-07 NOTE — Progress Notes (Signed)
HPI:  Mr. 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 Please refer to our prior cancer genetics clinic note for more information regarding our discussion, assessment and recommendations, at the time. Mr. Kulzer recent genetic test results were disclosed to him, as were recommendations warranted by these results. These results and recommendations are discussed in more detail below.  At the age of 73, Mr. 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 This was treated with nephrectomy and chemotherapy. 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 Patient has not had a colonoscopy. He had prostate cancer screening this year that was normal. He denies history of hemangioblastoma, pancreatic/kidney cysts, pancreatic neuroendocrine tumors and pheochromocytoma.   CANCER HISTORY:  Oncology History  Renal cell carcinoma ([place])  08/04/2019 Initial Diagnosis   Renal cell carcinoma ([place])   08/14/2019 -  Chemotherapy   The patient had ipilimumab (YERVOY) 100 mg in sodium chloride 0.9 % 50 mL chemo infusion, 0.9 mg/kg = 110 mg, Intravenous,  Once, 3 of 4 cycles Administration: 100 mg (08/14/2019), 100 mg (09/04/2019), 100 mg (09/25/2019) nivolumab (OPDIVO) 340 mg in sodium chloride 0.9 % 100 mL chemo infusion, 334 mg, Intravenous, Once, 3 of 6 cycles Administration: 340 mg (08/14/2019), 340 mg (09/04/2019), 340 mg (09/25/2019)  for chemotherapy treatment.    10/05/2019 Genetic Testing   Negative genetic testing. No pathogenic variants identified on the [facility] Multi-Cancer Panel. VUS in [place] called  c.4922T>C (p.Leu1641Pro) identified. The report date is 10/05/2019.  The Multi-Cancer Panel offered by [facility] includes sequencing and/or deletion duplication testing of  the following 85 genes: AIP, ALK, APC, ATM, AXIN2,BAP1,  BARD1, BLM, BMPR1A, BRCA1, BRCA2, BRIP1, CASR, CDC73, CDH1, CDK4, CDKN1B, CDKN1C, CDKN2A (p14ARF), CDKN2A (p16INK4a), CEBPA, CHEK2, CTNNA1, DICER1, DIS3L2, EGFR (c.2369C>T, p.Thr790Met variant only), EPCAM (Deletion/duplication testing only), FH, FLCN, GATA2, GPC3, GREM1 (Promoter region deletion/duplication testing only), HOXB13 (c.251G>A, p.Gly84Glu), HRAS, KIT, MAX, MEN1, MET, MITF (c.952G>A, p.Glu318Lys variant only), MLH1, MSH2, MSH3, MSH6, MUTYH, NBN, NF1, NF2, NTHL1, PALB2, PDGFRA, PHOX2B, PMS2, POLD1, POLE, POT1, PRKAR1A, PTCH1, PTEN, RAD50, RAD51C, RAD51D, RB1, RECQL4, RET, RNF43, RUNX1, SDHAF2, SDHA (sequence changes only), SDHB, SDHC, SDHD, SMAD4, SMARCA4, SMARCB1, SMARCE1, STK11, SUFU, TERC, TERT, TMEM127, TP53, TSC1, TSC2, VHL, WRN and WT1.      FAMILY HISTORY:  We obtained a detailed, 4-generation family history.  Significant diagnoses are listed below: Family History  Problem Relation Age of Onset  . Diabetes Mother   . Cancer Mother        neuroendocrine cancer  . Cancer Father        neuroendocrine cancer of meninges  . Cancer Maternal Grandmother        tomach dx 74  . Alzheimer's disease Paternal Grandmother   . Lung cancer Paternal Grandfather   . Lung cancer Maternal Aunt   . Colon cancer Maternal Uncle   . Breast cancer Paternal Aunt        both dx 35s  . Ovarian cancer Other        dx 87  . Breast cancer Cousin    Mr. Sanjuan has a son, 64 and a daughter, 10, no history of cancer. He has a sister, Kendrick Fries, who is 9 and a brother, Randall Hiss, 5, neither have had cancer.  Patient's mother was diagnosed with a neuroendocrine tumor the sigmoid colon at 54 and is living at 88. Patient has 2 maternal uncles, 4 maternal aunts. One uncle had colon cancer and died at 51. An aunt had lung cancer and died in her 76s. A maternal cousin had breast cancer at 18. Maternal grandmother had stomach cancer at 75 and died at 39.  Maternal grandfather died at 46, no cancer.  Patient's father was also diagnosed with a neuroendocrine tumor, this was of the meninges. He passed at 14. Patient has 1 full paternal uncle, no cancers, he died at age 43. Patient also has 2 paternal half aunts and 1 paternal half uncle through his grandmother. Both of these aunts had breast cancer in their 74s. His paternal grandmother died at 16 and had a sister who had ovarian cancer in her 25s. Patient also has a first cousin once removed on this side who had breast cancer in her 23s. Paternal grandfather died of lung cancer at 82.   Mr. Sia is unaware of previous family history of genetic testing for hereditary cancer risks. There is no reported Ashkenazi Jewish ancestry. There is no known consanguinity.  GENETIC TEST RESULTS: Genetic testing reported out on 10/05/2019 through the [facility] Multi- cancer panel found no pathogenic mutations.  The Multi-Cancer Panel offered by [facility] includes sequencing and/or deletion duplication testing of the following 85 genes: AIP, ALK, APC, ATM, AXIN2,BAP1,  BARD1, BLM, BMPR1A, BRCA1, BRCA2, BRIP1, CASR, CDC73, CDH1, CDK4, CDKN1B, CDKN1C, CDKN2A (p14ARF), CDKN2A (p16INK4a), CEBPA, CHEK2, CTNNA1, DICER1, DIS3L2, EGFR (c.2369C>T, p.Thr790Met variant only), EPCAM (Deletion/duplication testing only), FH, FLCN, GATA2, GPC3, GREM1 (Promoter region deletion/duplication testing only), HOXB13 (c.251G>A, p.Gly84Glu), HRAS, KIT, MAX, MEN1, MET, MITF (c.952G>A, p.Glu318Lys variant only), MLH1, MSH2, MSH3, MSH6, MUTYH, NBN, NF1, NF2, NTHL1, PALB2, PDGFRA, PHOX2B, PMS2, POLD1, POLE, POT1, PRKAR1A, PTCH1, PTEN, RAD50, RAD51C, RAD51D, RB1, RECQL4, RET, RNF43, RUNX1, SDHAF2, SDHA (sequence changes only), SDHB, SDHC, SDHD, SMAD4, SMARCA4, SMARCB1, SMARCE1, STK11, SUFU, TERC, TERT, TMEM127, TP53, TSC1, TSC2, VHL, WRN and WT1.   The test report has been scanned into EPIC and is located under the Molecular Pathology section of the Results  Review tab.  A portion of the result report is included below for reference.     We discussed with Mr. Drab that because current genetic testing is not perfect, it is possible there may be a gene mutation in one of these genes that current testing cannot detect, but that chance is small.  We also discussed, that there could be another gene that has not yet been discovered, or that we have not yet tested, that is responsible for the cancer diagnoses in the family. It is also possible there is a hereditary cause for the cancer in the family that Mr. Suazo did not inherit and therefore was not identified in his testing.  Therefore, it is important to remain in touch with cancer genetics in the future so that we can continue to offer Mr. Struckman the most up to date genetic testing.   Genetic testing did identify a variant of uncertain significance (VUS) was identified in the [facility] gene. At this time, it is unknown if this variant is associated with increased cancer risk or if this is a normal finding, but most variants such as this get reclassified to being inconsequential. It should not be used to make medical management decisions. With time, we suspect the lab will determine the significance of this variant, if any. If we do learn more  about it, we will try to contact Mr. Laventure to discuss it further. However, it is important to stay in touch with [place] periodically and keep the address and phone number up to date.  ADDITIONAL GENETIC TESTING: We discussed with Mr. Dufault that his genetic testing was fairly extensive.  If there are genes identified to increase cancer risk that can be analyzed in the future, we would be happy to discuss and coordinate this testing at that time.    CANCER SCREENING RECOMMENDATIONS: Mr. Arya test result is considered negative (normal).  This means that we have not identified a hereditary cause for his personal and family history of cancer at this time. Most cancers happen by chance  and this negative test suggests that his cancer may fall into this category.    While reassuring, this does not definitively rule out a hereditary predisposition to cancer. It is still possible that there could be genetic mutations that are undetectable by current technology. There could be genetic mutations in genes that have not been tested or identified to increase cancer risk.  Therefore, it is recommended he continue to follow the cancer management and screening guidelines provided by his oncology and primary healthcare provider.   An individual's cancer risk and medical management are not determined by genetic test results alone. Overall cancer risk assessment incorporates additional factors, including personal medical history, family history, and any available genetic information that may result in a personalized plan for cancer prevention and surveillance.  RECOMMENDATIONS FOR FAMILY MEMBERS:  Relatives in this family might be at some increased risk of developing cancer, over the general population risk, simply due to the family history of cancer.  We recommended male relatives in this family have a yearly mammogram beginning at age 66, or 18 years younger than the earliest onset of cancer, an annual clinical breast exam, and perform monthly breast self-exams. Male relatives in this family should also have a gynecological exam as recommended by their primary provider. All family members should have a colonoscopy by age 34, or as directed by their physicians.   It is also possible there is a hereditary cause for the cancer in Mr. Schwalbe's family that he did not inherit and therefore was not identified in him.  Based on Mr. Speranza's family history, we recommended his paternal relatives have genetic counseling and testing. Mr. Jurgens will let us know if we can be of any assistance in coordinating genetic counseling and/or testing for these family members.  FOLLOW-UP: Lastly, we discussed with Mr. Stilley  that cancer genetics is a rapidly advancing field and it is possible that new genetic tests will be appropriate for him and/or his family members in the future. We encouraged him to remain in contact with cancer genetics on an annual basis so we can update his personal and family histories and let him know of advances in cancer genetics that may benefit this family.   Our contact number was provided. Mr. Faison questions were answered to his satisfaction, and he knows he is welcome to call us at anytime with additional questions or concerns.   Faith Rogue, MS, [facility] Genetic Counselor [email or web address] Phone: [phone]

## 2019-10-09 ENCOUNTER — Telehealth: Payer: BC Managed Care – PPO | Admitting: Licensed Clinical Social Worker

## 2019-10-13 ENCOUNTER — Inpatient Hospital Stay: Payer: Medicare Other | Attending: Hospice and Palliative Medicine | Admitting: Hospice and Palliative Medicine

## 2019-10-13 DIAGNOSIS — D696 Thrombocytopenia, unspecified: Secondary | ICD-10-CM | POA: Insufficient documentation

## 2019-10-13 DIAGNOSIS — Z5112 Encounter for antineoplastic immunotherapy: Secondary | ICD-10-CM | POA: Insufficient documentation

## 2019-10-13 DIAGNOSIS — N189 Chronic kidney disease, unspecified: Secondary | ICD-10-CM | POA: Insufficient documentation

## 2019-10-13 DIAGNOSIS — C649 Malignant neoplasm of unspecified kidney, except renal pelvis: Secondary | ICD-10-CM | POA: Insufficient documentation

## 2019-10-13 DIAGNOSIS — G8929 Other chronic pain: Secondary | ICD-10-CM | POA: Insufficient documentation

## 2019-10-13 DIAGNOSIS — I10 Essential (primary) hypertension: Secondary | ICD-10-CM | POA: Insufficient documentation

## 2019-10-13 DIAGNOSIS — M549 Dorsalgia, unspecified: Secondary | ICD-10-CM | POA: Insufficient documentation

## 2019-10-13 DIAGNOSIS — I2510 Atherosclerotic heart disease of native coronary artery without angina pectoris: Secondary | ICD-10-CM | POA: Insufficient documentation

## 2019-10-13 DIAGNOSIS — E785 Hyperlipidemia, unspecified: Secondary | ICD-10-CM | POA: Insufficient documentation

## 2019-10-13 DIAGNOSIS — R161 Splenomegaly, not elsewhere classified: Secondary | ICD-10-CM | POA: Insufficient documentation

## 2019-10-13 DIAGNOSIS — Z79899 Other long term (current) drug therapy: Secondary | ICD-10-CM | POA: Insufficient documentation

## 2019-10-13 DIAGNOSIS — C7800 Secondary malignant neoplasm of unspecified lung: Secondary | ICD-10-CM | POA: Insufficient documentation

## 2019-10-13 DIAGNOSIS — D631 Anemia in chronic kidney disease: Secondary | ICD-10-CM | POA: Insufficient documentation

## 2019-10-13 DIAGNOSIS — Z515 Encounter for palliative care: Secondary | ICD-10-CM | POA: Diagnosis not present

## 2019-10-13 DIAGNOSIS — E1122 Type 2 diabetes mellitus with diabetic chronic kidney disease: Secondary | ICD-10-CM | POA: Insufficient documentation

## 2019-10-13 DIAGNOSIS — K7469 Other cirrhosis of liver: Secondary | ICD-10-CM | POA: Insufficient documentation

## 2019-10-13 DIAGNOSIS — Z794 Long term (current) use of insulin: Secondary | ICD-10-CM | POA: Insufficient documentation

## 2019-10-13 DIAGNOSIS — I129 Hypertensive chronic kidney disease with stage 1 through stage 4 chronic kidney disease, or unspecified chronic kidney disease: Secondary | ICD-10-CM | POA: Insufficient documentation

## 2019-10-13 DIAGNOSIS — Z87891 Personal history of nicotine dependence: Secondary | ICD-10-CM | POA: Insufficient documentation

## 2019-10-13 DIAGNOSIS — R188 Other ascites: Secondary | ICD-10-CM | POA: Insufficient documentation

## 2019-10-13 DIAGNOSIS — Z803 Family history of malignant neoplasm of breast: Secondary | ICD-10-CM | POA: Insufficient documentation

## 2019-10-13 DIAGNOSIS — Z800 Family history of malignant neoplasm of digestive organs: Secondary | ICD-10-CM | POA: Insufficient documentation

## 2019-10-13 NOTE — Progress Notes (Signed)
Virtual Visit via Telephone Note  I connected with Henry Moore on 10/13/19 at 10:30 AM EDT by telephone and verified that I am speaking with the correct person using two identifiers.   I discussed the limitations, risks, security and privacy concerns of performing an evaluation and management service by telephone and the availability of in person appointments. I also discussed with the patient that there may be a patient responsible charge related to this service. The patient expressed understanding and agreed to proceed.   History of Present Illness: Henry Moore is a 59 y.o. 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  Patient was referred to palliative care to help address goals and manage ongoing symptoms.   Observations/Objective: I called and spoke with patient by phone.  Patient reports he is doing well.  He denies any significant changes or concerns.  No distressing symptoms were reported today.  Patient reports good oral intake.  No changes in performance status.  However, he did have a fall last week after tripping over his dog in the middle the night.  He did not sustain injury.  Patient says that he is tolerating treatment well.  He reports good coping.  Denies any depression or anxiety.  Patient did endorse some financial strain related to treatments.  I discussed the option of referral for social work -Barnabas Lister but patient declined.  He wants to speak with the billing department first.  Assessment and Plan: RCC -on treatment with nivolumab.  Followed by Dr. Tasia Catchings.  Seems to be tolerating treatments well.  No symptomatic complaints today.  Financial strain -patient wants to speak with the billing department.  Could refer to Barnabas Lister if needed  Follow Up Instructions: Virtual visit in 1 to 2 months   I discussed the assessment and treatment plan with the patient.  The patient was provided an opportunity to ask questions and all were answered. The patient agreed with the plan and demonstrated an understanding of the instructions.   The patient was advised to call back or seek an in-person evaluation if the symptoms worsen or if the condition fails to improve as anticipated.  I provided 10 minutes of non-face-to-face time during this encounter.   Irean Hong, NP

## 2019-10-16 ENCOUNTER — Inpatient Hospital Stay: Payer: Medicare Other

## 2019-10-16 ENCOUNTER — Encounter: Payer: Self-pay | Admitting: Oncology

## 2019-10-16 ENCOUNTER — Inpatient Hospital Stay (HOSPITAL_BASED_OUTPATIENT_CLINIC_OR_DEPARTMENT_OTHER): Payer: Medicare Other | Admitting: Oncology

## 2019-10-16 VITALS — BP 111/73 | HR 76 | Temp 97.8°F | Resp 18 | Wt 250.0 lb

## 2019-10-16 DIAGNOSIS — R161 Splenomegaly, not elsewhere classified: Secondary | ICD-10-CM | POA: Diagnosis not present

## 2019-10-16 DIAGNOSIS — E785 Hyperlipidemia, unspecified: Secondary | ICD-10-CM | POA: Diagnosis not present

## 2019-10-16 DIAGNOSIS — Z794 Long term (current) use of insulin: Secondary | ICD-10-CM | POA: Diagnosis not present

## 2019-10-16 DIAGNOSIS — E1122 Type 2 diabetes mellitus with diabetic chronic kidney disease: Secondary | ICD-10-CM | POA: Diagnosis not present

## 2019-10-16 DIAGNOSIS — C7800 Secondary malignant neoplasm of unspecified lung: Secondary | ICD-10-CM | POA: Diagnosis not present

## 2019-10-16 DIAGNOSIS — Z5112 Encounter for antineoplastic immunotherapy: Secondary | ICD-10-CM

## 2019-10-16 DIAGNOSIS — Z800 Family history of malignant neoplasm of digestive organs: Secondary | ICD-10-CM | POA: Diagnosis not present

## 2019-10-16 DIAGNOSIS — C649 Malignant neoplasm of unspecified kidney, except renal pelvis: Secondary | ICD-10-CM | POA: Diagnosis present

## 2019-10-16 DIAGNOSIS — D696 Thrombocytopenia, unspecified: Secondary | ICD-10-CM | POA: Diagnosis not present

## 2019-10-16 DIAGNOSIS — I2510 Atherosclerotic heart disease of native coronary artery without angina pectoris: Secondary | ICD-10-CM | POA: Diagnosis not present

## 2019-10-16 DIAGNOSIS — Z803 Family history of malignant neoplasm of breast: Secondary | ICD-10-CM | POA: Diagnosis not present

## 2019-10-16 DIAGNOSIS — R188 Other ascites: Secondary | ICD-10-CM | POA: Diagnosis not present

## 2019-10-16 DIAGNOSIS — D631 Anemia in chronic kidney disease: Secondary | ICD-10-CM | POA: Diagnosis not present

## 2019-10-16 DIAGNOSIS — K7469 Other cirrhosis of liver: Secondary | ICD-10-CM

## 2019-10-16 DIAGNOSIS — I129 Hypertensive chronic kidney disease with stage 1 through stage 4 chronic kidney disease, or unspecified chronic kidney disease: Secondary | ICD-10-CM | POA: Diagnosis not present

## 2019-10-16 DIAGNOSIS — M549 Dorsalgia, unspecified: Secondary | ICD-10-CM | POA: Diagnosis not present

## 2019-10-16 DIAGNOSIS — Z87891 Personal history of nicotine dependence: Secondary | ICD-10-CM | POA: Diagnosis not present

## 2019-10-16 DIAGNOSIS — G8929 Other chronic pain: Secondary | ICD-10-CM | POA: Diagnosis not present

## 2019-10-16 DIAGNOSIS — N189 Chronic kidney disease, unspecified: Secondary | ICD-10-CM | POA: Diagnosis not present

## 2019-10-16 DIAGNOSIS — Z79899 Other long term (current) drug therapy: Secondary | ICD-10-CM | POA: Diagnosis not present

## 2019-10-16 DIAGNOSIS — I10 Essential (primary) hypertension: Secondary | ICD-10-CM | POA: Diagnosis not present

## 2019-10-16 LAB — COMPREHENSIVE METABOLIC PANEL
ALT: 25 U/L (ref 0–44)
AST: 30 U/L (ref 15–41)
Albumin: 4.1 g/dL (ref 3.5–5.0)
Alkaline Phosphatase: 105 U/L (ref 38–126)
Anion gap: 9 (ref 5–15)
BUN: 33 mg/dL — ABNORMAL HIGH (ref 6–20)
CO2: 20 mmol/L — ABNORMAL LOW (ref 22–32)
Calcium: 8.6 mg/dL — ABNORMAL LOW (ref 8.9–10.3)
Chloride: 104 mmol/L (ref 98–111)
Creatinine, Ser: 1.85 mg/dL — ABNORMAL HIGH (ref 0.61–1.24)
GFR calc Af Amer: 46 mL/min — ABNORMAL LOW (ref >60)
GFR calc non Af Amer: 40 mL/min — ABNORMAL LOW (ref >60)
Glucose, Bld: 127 mg/dL — ABNORMAL HIGH (ref 70–99)
Potassium: 4.4 mmol/L (ref 3.5–5.1)
Sodium: 133 mmol/L — ABNORMAL LOW (ref 135–145)
Total Bilirubin: 1.2 mg/dL (ref 0.3–1.2)
Total Protein: 7.9 g/dL (ref 6.5–8.1)

## 2019-10-16 LAB — CBC WITH DIFFERENTIAL/PLATELET
Abs Immature Granulocytes: 0 10*3/uL (ref 0.00–0.07)
Basophils Absolute: 0 10*3/uL (ref 0.0–0.1)
Basophils Relative: 2 %
Eosinophils Absolute: 0.2 10*3/uL (ref 0.0–0.5)
Eosinophils Relative: 8 %
HCT: 28.6 % — ABNORMAL LOW (ref 39.0–52.0)
Hemoglobin: 10 g/dL — ABNORMAL LOW (ref 13.0–17.0)
Immature Granulocytes: 0 %
Lymphocytes Relative: 22 %
Lymphs Abs: 0.5 10*3/uL — ABNORMAL LOW (ref 0.7–4.0)
MCH: 31.4 pg (ref 26.0–34.0)
MCHC: 35 g/dL (ref 30.0–36.0)
MCV: 89.9 fL (ref 80.0–100.0)
Monocytes Absolute: 0.3 10*3/uL (ref 0.1–1.0)
Monocytes Relative: 13 %
Neutro Abs: 1.1 10*3/uL — ABNORMAL LOW (ref 1.7–7.7)
Neutrophils Relative %: 55 %
Platelets: 63 10*3/uL — ABNORMAL LOW (ref 150–400)
RBC: 3.18 MIL/uL — ABNORMAL LOW (ref 4.22–5.81)
RDW: 13.9 % (ref 11.5–15.5)
WBC: 2.1 10*3/uL — ABNORMAL LOW (ref 4.0–10.5)
nRBC: 0 % (ref 0.0–0.2)

## 2019-10-16 MED ORDER — FAMOTIDINE IN NACL 20-0.9 MG/50ML-% IV SOLN
20.0000 mg | Freq: Once | INTRAVENOUS | Status: AC
  Administered 2019-10-16: 20 mg via INTRAVENOUS
  Filled 2019-10-16: qty 50

## 2019-10-16 MED ORDER — DIPHENHYDRAMINE HCL 50 MG/ML IJ SOLN
25.0000 mg | Freq: Once | INTRAMUSCULAR | Status: AC
  Administered 2019-10-16: 25 mg via INTRAVENOUS
  Filled 2019-10-16: qty 1

## 2019-10-16 MED ORDER — SODIUM CHLORIDE 0.9 % IV SOLN
Freq: Once | INTRAVENOUS | Status: AC
  Filled 2019-10-16: qty 250

## 2019-10-16 MED ORDER — SODIUM CHLORIDE 0.9 % IV SOLN
340.0000 mg | Freq: Once | INTRAVENOUS | Status: AC
  Administered 2019-10-16: 340 mg via INTRAVENOUS
  Filled 2019-10-16: qty 10

## 2019-10-16 MED ORDER — SODIUM CHLORIDE 0.9 % IV SOLN
100.0000 mg | Freq: Once | INTRAVENOUS | Status: AC
  Administered 2019-10-16: 100 mg via INTRAVENOUS
  Filled 2019-10-16: qty 20

## 2019-10-16 NOTE — Progress Notes (Signed)
Patient does not offer any problems today.  

## 2019-10-16 NOTE — Progress Notes (Signed)
Hematology/Oncology follow-up [facility] Telephone:([phone] [phone]   Patient Care Team: Albina Billet, MD as PCP - General (Internal Medicine) Earlie Server, MD as Consulting Physician (Hematology and Oncology)  REFERRING PROVIDER: Albina Billet, MD  CHIEF COMPLAINTS/REASON FOR VISIT:  Follow-up for kidney cancer  HISTORY OF PRESENTING ILLNESS:   Henry Moore is a  59 y.o.  male with PMH listed below was seen in consultation at the request of  Albina Billet, MD  for evaluation of kidney cancer Extensive medical records were reviewed Patient had MRI lumbar spine without contrast done on 07/13/2018 which showed mired lumbar degenerative disease. CT thoracic spine was done on 08/05/2018 which showed 5 cm left renal mass. 09/04/2018 CT abdomen and pelvis with and without contrast showed 5 cm round enhancing solid mass in the left kidney.  No involvement of left renal vein.  No lymphadenopathy Morphologic changes consistent with cirrhosis and the portal hypertension.  Small volume ascites and splenomegaly. . 10/16/2018 Left nephrectomy showed renal cell carcinoma, clear-cell type, nuclear grade 4, tumor extends into the renal vein and renal sinus fat.  Urethral, vascular and or resection margins are negative for tumor pT3a Nx Mx  01/28/2019 patient had another CT chest abdomen pelvis done with contrast Showed interval left nephrectomy.  Scattered tiny pulmonary nodules bilaterally.  Nonspecific.  No other evidence of metastatic disease.  Cirrhosis changes extensive coronary and aortic atherosclerosis  07/14/2019 patient underwent surveillance CT chest abdomen pelvis without contrast Interval progression of pulmonary metastasis with new enlarged right paratracheal lymph node 1.7 cm, previously 0.6 cm one-point since worrisome for metastatic adenopathy.  Multiple progressive pulmonary nodules, index nodule within the lingula measures 1.3 cm, previously 3  mm. Index subpleural nodule within the anterior right upper lobe measuring 1.8 cm, previously 3 cm Index nodule within the post lateral right lower lobe measuring 5 mm, new from previous care Aortic sclerosis.  Three-vessel coronary artery calcification noted.  Cirrhosis changes.  Splenomegaly.  Patient was referred to establish care with medical oncology for further discussion and evaluation of metastatic renal cell carcinoma. Patient reports feeling well at baseline today.  Denies any shortness of breath, cough, hemoptysis, back pain, headache. He quitted smoking 2015.  Not currently actively drinking alcohol as well. Patient has chronic back pain unchanged.   # Liver cirrhosis with small amount of ascites/splenomegaly patient sees gastroenterology Dr. [name].  . He will get ultrasound surveillance due in February 2021 for screening of [name].  EGD surveillance for Barrett's plan in April 2021.  # Diabetes, on Humalog sliding scale and metformin.   INTERVAL HISTORY Henry Moore is a 59 y.o. male who has above history reviewed by me today presents for follow up visit for management of stage IV RCC  Problems and complaints are listed below: He feels well. Denies any new complaints.   Review of Systems  Constitutional: Positive for fatigue. Negative for appetite change, chills, fever and unexpected weight change.  HENT:   Negative for hearing loss and voice change.   Eyes: Negative for eye problems and icterus.  Respiratory: Negative for chest tightness, cough and shortness of breath.   Cardiovascular: Negative for chest pain and leg swelling.  Gastrointestinal: Negative for abdominal distention and abdominal pain.  Endocrine: Negative for hot flashes.  Genitourinary: Negative for difficulty urinating, dysuria and frequency.   Musculoskeletal: Positive for back pain. Negative for arthralgias.  Skin: Negative for itching and rash.  Neurological: Negative for light-headedness and  numbness.  Hematological: Negative for adenopathy. Does not bruise/bleed easily.  Psychiatric/Behavioral: Negative for confusion.    MEDICAL HISTORY:  Past Medical History:  Diagnosis Date  . Cough    SINUS DRAINAGE CAUSING COUGHING , REPORTS CLEAR MUCOUS   . Diabetes mellitus without complication ([place])   . Family history of breast cancer   . Family history of colon cancer   . Family history of stomach cancer   . HTN (hypertension)   . Hyperlipemia   . Left renal mass   . Platelets decreased ([place])    DENIES UNSUAL BLEEDING   . PONV (postoperative nausea and vomiting)   . Renal cell carcinoma ([place]) 08/04/2019  . WBC decreased     SURGICAL HISTORY: Past Surgical History:  Procedure Laterality Date  . ANKLE SURGERY Left   . ANTERIOR CERVICAL DECOMP/DISCECTOMY FUSION N/A 04/16/2014   Procedure: ANTERIOR CERVICAL DECOMPRESSION/DISCECTOMY FUSION  (ACDF C5-C7)   (2 LEVELS)     ;  Surgeon: Melina Schools, MD;  Location: [facility];  Service: Orthopedics;  Laterality: N/A;  . APPENDECTOMY    . BACK SURGERY    . ESOPHAGOGASTRODUODENOSCOPY (EGD) WITH PROPOFOL N/A 02/06/2019   Procedure: ESOPHAGOGASTRODUODENOSCOPY (EGD) WITH PROPOFOL;  Surgeon: [name], MD;  Location: [facility] ENDOSCOPY;  Service: Gastroenterology;  Laterality: N/A;  . ESOPHAGOGASTRODUODENOSCOPY (EGD) WITH PROPOFOL N/A 03/04/2019   Procedure: ESOPHAGOGASTRODUODENOSCOPY (EGD) WITH PROPOFOL;  Surgeon: Lin Landsman, MD;  Location: [facility] ENDOSCOPY;  Service: Gastroenterology;  Laterality: N/A;  . ESOPHAGOGASTRODUODENOSCOPY (EGD) WITH PROPOFOL N/A 04/22/2019   Procedure: ESOPHAGOGASTRODUODENOSCOPY (EGD) WITH PROPOFOL;  Surgeon: [name], MD;  Location: [facility] ENDOSCOPY;  Service: Gastroenterology;  Laterality: N/A;  . FRACTURE SURGERY    . HYDROCELE EXCISION    . KNEE ARTHROSCOPY Bilateral   . LAPAROSCOPIC NEPHRECTOMY, HAND ASSISTED Left 10/16/2018   Procedure: LEFT HAND ASSISTED LAPAROSCOPIC NEPHRECTOMY;  Surgeon: Lucas Mallow, MD;  Location: [facility]S;  Service: Urology;  Laterality: Left;  . NASAL SINUS SURGERY     X 2  . 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 SHOULDER SURGERY Left     SOCIAL HISTORY: Social History   Socioeconomic History  . Marital status: Married    Spouse name: Not on file  . Number of children: Not on file  . Years of education: Not on file  . Highest education level: Not on file  Occupational History  . Not on file  Tobacco Use  . Smoking status: Former Smoker    Packs/day: 0.25    Years: 20.00    Pack years: 5.00    Quit date: 2015    Years since quitting: 6.2  . Smokeless tobacco: Never Used  Substance and Sexual Activity  . Alcohol use: Not Currently    Alcohol/week: 0.0 standard drinks    Comment: no alchol in 1 year  . Drug use: No  . Sexual activity: Not on file  Other Topics Concern  . Not on file  Social History Narrative  . Not on file   Social Determinants of Health   Financial Resource Strain:   . Difficulty of Paying Living Expenses:   Food Insecurity:   . Worried About Charity fundraiser in the Last Year:   . Arboriculturist in the Last Year:   Transportation Needs:   . Film/video editor (Medical):   Marland Kitchen Lack of Transportation (Non-Medical):   Physical Activity:   . Days of Exercise per Week:   . Minutes of  Exercise per Session:   Stress:   . Feeling of Stress :   Social Connections:   . Frequency of Communication with Friends and Family:   . Frequency of Social Gatherings with Friends and Family:   . Attends Religious Services:   . Active Member of Clubs or Organizations:   . Attends Archivist Meetings:   Marland Kitchen Marital Status:   Intimate Partner Violence:   . Fear of Current or Ex-Partner:   . Emotionally Abused:   Marland Kitchen Physically Abused:   . Sexually Abused:     FAMILY HISTORY: Family History  Problem Relation Age of Onset  . Diabetes Mother   . Cancer Mother        neuroendocrine cancer  . Cancer Father         neuroendocrine cancer of meninges  . Cancer Maternal Grandmother        tomach dx 43  . Alzheimer's disease Paternal Grandmother   . Lung cancer Paternal Grandfather   . Lung cancer Maternal Aunt   . Colon cancer Maternal Uncle   . Breast cancer Paternal Aunt        both dx 40s  . Ovarian cancer Other        dx 41  . Breast cancer Cousin     ALLERGIES:  is allergic to codeine and mucinex [guaifenesin er].  MEDICATIONS:  Current Outpatient Medications  Medication Sig Dispense Refill  . ACCU-CHEK AVIVA PLUS test strip CHECK BL00D SUGAR ONCE OR TWICE DAILY. (Patient taking differently: 1 each by Other route as needed. ) 100 each PRN  . amLODipine (NORVASC) 10 MG tablet Take 10 mg by mouth daily.    . carvedilol (COREG) 25 MG tablet Take 1 tablet (25 mg total) by mouth 2 (two) times daily with a meal. 180 tablet 3  . glipiZIDE (GLUCOTROL) 5 MG tablet Take 1 tablet (5 mg total) by mouth daily before breakfast. 90 tablet 3  . hydrochlorothiazide (HYDRODIURIL) 25 MG tablet TAKE 1 TABLET DAILY. 90 tablet 0  . insulin lispro (INSULIN LISPRO) 100 UNIT/ML KwikPen Junior Inject into the skin 3 (three) times daily as needed (PER SLIDING SCALE).    Marland Kitchen losartan (COZAAR) 100 MG tablet Take 1 tablet by mouth daily. 90 tablet 0  . lovastatin (MEVACOR) 40 MG tablet Take 1 tablet (40 mg total) by mouth at bedtime. 60 tablet 0  . metFORMIN (GLUCOPHAGE-XR) 500 MG 24 hr tablet Take 500 mg by mouth 2 (two) times daily.     . prochlorperazine (COMPAZINE) 10 MG tablet Take 1 tablet (10 mg total) by mouth every 6 (six) hours as needed for nausea or vomiting. 30 tablet 0   No current facility-administered medications for this visit.     PHYSICAL EXAMINATION: ECOG PERFORMANCE STATUS: 1 - Symptomatic but completely ambulatory Vitals:   10/16/19 0844  BP: 111/73  Pulse: 76  Resp: 18  Temp: 97.8 F (36.6 C)   Filed Weights   10/16/19 0844  Weight: 250 lb (113.4 kg)    Physical Exam Constitutional:        General: He is not in acute distress.    Appearance: He is obese.  HENT:     Head: Normocephalic and atraumatic.  Eyes:     General: No scleral icterus. Cardiovascular:     Rate and Rhythm: Normal rate and regular rhythm.     Heart sounds: Normal heart sounds.  Pulmonary:     Effort: Pulmonary effort is normal. No respiratory distress.  Breath sounds: No wheezing.  Abdominal:     General: Bowel sounds are normal. There is no distension.     Palpations: Abdomen is soft.  Musculoskeletal:        General: No deformity. Normal range of motion.     Cervical back: Normal range of motion and neck supple.  Skin:    General: Skin is warm and dry.     Findings: No erythema or rash.  Neurological:     Mental Status: He is alert and oriented to person, place, and time. Mental status is at baseline.     Cranial Nerves: No cranial nerve deficit.     Coordination: Coordination normal.  Psychiatric:        Mood and Affect: Mood normal.     LABORATORY DATA:  I have reviewed the data as listed Lab Results  Component Value Date   WBC 2.3 (L) 09/25/2019   HGB 9.6 (L) 09/25/2019   HCT 28.5 (L) 09/25/2019   MCV 92.2 09/25/2019   PLT 73 (L) 09/25/2019   Recent Labs    08/21/19 0843 09/04/19 0804 09/25/19 0759  NA 134* 134* 136  K 4.3 4.3 4.4  CL 105 104 104  CO2 19* 20* 21*  GLUCOSE 180* 117* 117*  BUN 32* 26* 40*  CREATININE 1.77* 1.67* 1.71*  CALCIUM 9.1 8.9 8.8*  GFRNONAA 42* 45* 44*  GFRAA 49* 52* 51*  PROT 8.2* 8.0 8.0  ALBUMIN 4.3 4.2 4.1  AST 28 27 32  ALT 23 21 23   ALKPHOS 135* 114 101  BILITOT 1.0 0.9 1.0   Iron/TIBC/Ferritin/ %Sat    Component Value Date/Time   IRON 61 08/21/2019 0843   IRON 105 10/15/2018 1325   TIBC 284 08/21/2019 0843   TIBC 244 (L) 10/15/2018 1325   FERRITIN 237 08/21/2019 0843   FERRITIN 588 (H) 10/15/2018 1325   IRONPCTSAT 22 08/21/2019 0843   IRONPCTSAT 43 10/15/2018 1325      RADIOGRAPHIC STUDIES: I have personally  reviewed the radiological images as listed and agreed with the findings in the report. MR Brain W Wo Contrast  Result Date: 08/05/2019 CLINICAL DATA:  History of renal cell carcinoma.  Staging. EXAM: MRI HEAD WITHOUT AND WITH CONTRAST TECHNIQUE: Multiplanar, multiecho pulse sequences of the brain and surrounding structures were obtained without and with intravenous contrast. CONTRAST:  2mL GADAVIST GADOBUTROL 1 MMOL/ML IV SOLN COMPARISON:  None. FINDINGS: Brain: Remote microhemorrhage in the left frontal lobe. No underlying edema, mass, or abnormal-nonspecific in isolation. No infarct, hydrocephalus, collection, or abnormal intracranial enhancement. Vascular: Normal flow voids and vascular enhancements Skull and upper cervical spine: Normal marrow signal. Sinuses/Orbits: Mild mucosal thickening scattered in paranasal sinuses with frothy secretions in the right sphenoid sinus. There has been prior endoscopic sinus surgery. IMPRESSION: No evidence of metastatic disease. Electronically Signed   By: Monte Fantasia M.D.   On: 08/05/2019 08:52   NM Bone Scan Whole Body  Result Date: 08/11/2019 CLINICAL DATA:  Renal cell carcinoma. EXAM: NUCLEAR MEDICINE WHOLE BODY BONE SCAN TECHNIQUE: Whole body anterior and posterior images were obtained approximately 3 hours after intravenous injection of radiopharmaceutical. RADIOPHARMACEUTICALS:  23 mCi Technetium-40m MDP IV COMPARISON:  CT chest, abdomen, and pelvis 07/14/2019 FINDINGS: No abnormal foci of increased or decreased radiotracer uptake identified to suggest osseous metastasis. Normal physiologic activity within the right kidney and bladder. Left kidney surgically absent. IMPRESSION: 1. No findings to suggest bone metastases. 2. Left nephrectomy. Electronically Signed   By: Queen Slough.D.  On: 08/11/2019 16:06     ASSESSMENT & PLAN:  1. Renal cell carcinoma, unspecified laterality ([place])   2. Encounter for antineoplastic immunotherapy   3. Other  cirrhosis of liver ([place])   4. 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 Labs are reviewed and discussed with patient. Counts are acceptable to proceed with cycle 4 nivolumab and ipilimumab.  Plan to obtain CT chest abdomen pelvis prior to next visit.   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 LFT is stable. ANC 1.1  #Anemia secondary to chronic kidney disease, hemoglobin is 10.  #Family history of cancer and personal history of RCC.  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   Follow-up 3 weeks lab MD for assessment evaluation prior to the next cycle of ipilimumab and nivolumab. All questions were answered. The patient knows to call the clinic with any problems questions or concerns.   Earlie Server, MD, PhD Hematology Oncology [facility] Pager- [phone] 10/16/2019

## 2019-10-16 NOTE — Progress Notes (Signed)
Labs reviewed with MD and treatment team, per MD to continue with treatment today. Pt updated and all questions answered at this time.   Jevan Gaunt [employer]

## 2019-10-29 ENCOUNTER — Other Ambulatory Visit: Payer: Self-pay

## 2019-10-29 ENCOUNTER — Ambulatory Visit
Admission: RE | Admit: 2019-10-29 | Discharge: 2019-10-29 | Disposition: A | Discharge status: Home / Self Care | Payer: Medicare Other | Source: Ambulatory Visit | Attending: Oncology | Admitting: Oncology

## 2019-10-29 DIAGNOSIS — I700 Atherosclerosis of aorta: Secondary | ICD-10-CM | POA: Insufficient documentation

## 2019-10-29 DIAGNOSIS — R161 Splenomegaly, not elsewhere classified: Secondary | ICD-10-CM | POA: Diagnosis not present

## 2019-10-29 DIAGNOSIS — I2510 Atherosclerotic heart disease of native coronary artery without angina pectoris: Secondary | ICD-10-CM | POA: Insufficient documentation

## 2019-10-29 DIAGNOSIS — C649 Malignant neoplasm of unspecified kidney, except renal pelvis: Secondary | ICD-10-CM | POA: Insufficient documentation

## 2019-10-29 DIAGNOSIS — K7460 Unspecified cirrhosis of liver: Secondary | ICD-10-CM | POA: Diagnosis not present

## 2019-10-29 DIAGNOSIS — K5730 Diverticulosis of large intestine without perforation or abscess without bleeding: Secondary | ICD-10-CM | POA: Diagnosis not present

## 2019-10-29 DIAGNOSIS — R918 Other nonspecific abnormal finding of lung field: Secondary | ICD-10-CM | POA: Diagnosis not present

## 2019-10-29 NOTE — Progress Notes (Signed)
Pharmacist Chemotherapy Monitoring - Follow Up Assessment    I verify that I have reviewed each item in the below checklist:  . Regimen for the patient is scheduled for the appropriate day and plan matches scheduled date. Marland Kitchen Appropriate non-routine labs are ordered dependent on drug ordered. . If applicable, additional medications reviewed and ordered per protocol based on lifetime cumulative doses and/or treatment regimen.   Plan for follow-up and/or issues identified: No . I-vent associated with next due treatment: No . MD and/or nursing notified: No  Brissia Delisa K 10/29/2019 7:49 AM

## 2019-11-06 ENCOUNTER — Telehealth (INDEPENDENT_AMBULATORY_CARE_PROVIDER_SITE_OTHER): Payer: Self-pay

## 2019-11-06 ENCOUNTER — Other Ambulatory Visit: Payer: Self-pay

## 2019-11-06 ENCOUNTER — Encounter: Payer: Self-pay | Admitting: Oncology

## 2019-11-06 ENCOUNTER — Inpatient Hospital Stay: Payer: Medicare Other

## 2019-11-06 ENCOUNTER — Inpatient Hospital Stay (HOSPITAL_BASED_OUTPATIENT_CLINIC_OR_DEPARTMENT_OTHER): Payer: Medicare Other | Admitting: Oncology

## 2019-11-06 ENCOUNTER — Inpatient Hospital Stay: Payer: Medicare Other | Attending: Oncology

## 2019-11-06 VITALS — BP 148/74 | HR 80 | Temp 95.8°F | Resp 18 | Wt 248.2 lb

## 2019-11-06 DIAGNOSIS — N189 Chronic kidney disease, unspecified: Secondary | ICD-10-CM | POA: Diagnosis not present

## 2019-11-06 DIAGNOSIS — Z800 Family history of malignant neoplasm of digestive organs: Secondary | ICD-10-CM | POA: Insufficient documentation

## 2019-11-06 DIAGNOSIS — Z803 Family history of malignant neoplasm of breast: Secondary | ICD-10-CM | POA: Insufficient documentation

## 2019-11-06 DIAGNOSIS — R188 Other ascites: Secondary | ICD-10-CM | POA: Diagnosis not present

## 2019-11-06 DIAGNOSIS — R161 Splenomegaly, not elsewhere classified: Secondary | ICD-10-CM | POA: Diagnosis not present

## 2019-11-06 DIAGNOSIS — I2510 Atherosclerotic heart disease of native coronary artery without angina pectoris: Secondary | ICD-10-CM | POA: Insufficient documentation

## 2019-11-06 DIAGNOSIS — C642 Malignant neoplasm of left kidney, except renal pelvis: Secondary | ICD-10-CM | POA: Insufficient documentation

## 2019-11-06 DIAGNOSIS — E785 Hyperlipidemia, unspecified: Secondary | ICD-10-CM | POA: Diagnosis not present

## 2019-11-06 DIAGNOSIS — Z79899 Other long term (current) drug therapy: Secondary | ICD-10-CM | POA: Diagnosis not present

## 2019-11-06 DIAGNOSIS — M549 Dorsalgia, unspecified: Secondary | ICD-10-CM | POA: Insufficient documentation

## 2019-11-06 DIAGNOSIS — Z5112 Encounter for antineoplastic immunotherapy: Secondary | ICD-10-CM | POA: Insufficient documentation

## 2019-11-06 DIAGNOSIS — D696 Thrombocytopenia, unspecified: Secondary | ICD-10-CM | POA: Insufficient documentation

## 2019-11-06 DIAGNOSIS — I129 Hypertensive chronic kidney disease with stage 1 through stage 4 chronic kidney disease, or unspecified chronic kidney disease: Secondary | ICD-10-CM | POA: Insufficient documentation

## 2019-11-06 DIAGNOSIS — C649 Malignant neoplasm of unspecified kidney, except renal pelvis: Secondary | ICD-10-CM

## 2019-11-06 DIAGNOSIS — Z87891 Personal history of nicotine dependence: Secondary | ICD-10-CM | POA: Diagnosis not present

## 2019-11-06 DIAGNOSIS — G8929 Other chronic pain: Secondary | ICD-10-CM | POA: Diagnosis not present

## 2019-11-06 DIAGNOSIS — K7469 Other cirrhosis of liver: Secondary | ICD-10-CM | POA: Diagnosis not present

## 2019-11-06 DIAGNOSIS — K5730 Diverticulosis of large intestine without perforation or abscess without bleeding: Secondary | ICD-10-CM | POA: Diagnosis not present

## 2019-11-06 DIAGNOSIS — C7800 Secondary malignant neoplasm of unspecified lung: Secondary | ICD-10-CM | POA: Insufficient documentation

## 2019-11-06 DIAGNOSIS — Z801 Family history of malignant neoplasm of trachea, bronchus and lung: Secondary | ICD-10-CM | POA: Diagnosis not present

## 2019-11-06 DIAGNOSIS — Z794 Long term (current) use of insulin: Secondary | ICD-10-CM | POA: Insufficient documentation

## 2019-11-06 DIAGNOSIS — E1122 Type 2 diabetes mellitus with diabetic chronic kidney disease: Secondary | ICD-10-CM | POA: Diagnosis not present

## 2019-11-06 DIAGNOSIS — D631 Anemia in chronic kidney disease: Secondary | ICD-10-CM | POA: Insufficient documentation

## 2019-11-06 LAB — COMPREHENSIVE METABOLIC PANEL
ALT: 23 U/L (ref 0–44)
AST: 30 U/L (ref 15–41)
Albumin: 4.2 g/dL (ref 3.5–5.0)
Alkaline Phosphatase: 112 U/L (ref 38–126)
Anion gap: 11 (ref 5–15)
BUN: 34 mg/dL — ABNORMAL HIGH (ref 6–20)
CO2: 21 mmol/L — ABNORMAL LOW (ref 22–32)
Calcium: 9.3 mg/dL (ref 8.9–10.3)
Chloride: 104 mmol/L (ref 98–111)
Creatinine, Ser: 1.73 mg/dL — ABNORMAL HIGH (ref 0.61–1.24)
GFR calc Af Amer: 50 mL/min — ABNORMAL LOW (ref >60)
GFR calc non Af Amer: 43 mL/min — ABNORMAL LOW (ref >60)
Glucose, Bld: 123 mg/dL — ABNORMAL HIGH (ref 70–99)
Potassium: 4.4 mmol/L (ref 3.5–5.1)
Sodium: 136 mmol/L (ref 135–145)
Total Bilirubin: 0.9 mg/dL (ref 0.3–1.2)
Total Protein: 8.2 g/dL — ABNORMAL HIGH (ref 6.5–8.1)

## 2019-11-06 LAB — CBC WITH DIFFERENTIAL/PLATELET
Abs Immature Granulocytes: 0.01 10*3/uL (ref 0.00–0.07)
Basophils Absolute: 0 10*3/uL (ref 0.0–0.1)
Basophils Relative: 1 %
Eosinophils Absolute: 0.2 10*3/uL (ref 0.0–0.5)
Eosinophils Relative: 6 %
HCT: 31.3 % — ABNORMAL LOW (ref 39.0–52.0)
Hemoglobin: 10.7 g/dL — ABNORMAL LOW (ref 13.0–17.0)
Immature Granulocytes: 0 %
Lymphocytes Relative: 20 %
Lymphs Abs: 0.5 10*3/uL — ABNORMAL LOW (ref 0.7–4.0)
MCH: 31.5 pg (ref 26.0–34.0)
MCHC: 34.2 g/dL (ref 30.0–36.0)
MCV: 92.1 fL (ref 80.0–100.0)
Monocytes Absolute: 0.3 10*3/uL (ref 0.1–1.0)
Monocytes Relative: 12 %
Neutro Abs: 1.7 10*3/uL (ref 1.7–7.7)
Neutrophils Relative %: 61 %
Platelets: 62 10*3/uL — ABNORMAL LOW (ref 150–400)
RBC: 3.4 MIL/uL — ABNORMAL LOW (ref 4.22–5.81)
RDW: 13.1 % (ref 11.5–15.5)
WBC: 2.8 10*3/uL — ABNORMAL LOW (ref 4.0–10.5)
nRBC: 0 % (ref 0.0–0.2)

## 2019-11-06 LAB — TSH: TSH: 4.815 u[IU]/mL — ABNORMAL HIGH (ref 0.350–4.500)

## 2019-11-06 MED ORDER — SODIUM CHLORIDE 0.9 % IV SOLN
240.0000 mg | Freq: Once | INTRAVENOUS | Status: AC
  Administered 2019-11-06: 240 mg via INTRAVENOUS
  Filled 2019-11-06: qty 24

## 2019-11-06 MED ORDER — SODIUM CHLORIDE 0.9 % IV SOLN
Freq: Once | INTRAVENOUS | Status: AC
  Filled 2019-11-06: qty 250

## 2019-11-06 NOTE — Telephone Encounter (Signed)
Spoke with the patient and he is now scheduled with Dr. Delana Meyer for a port placement on 11/11/19 with a 9:00 am arrival time to the [facility]. Patient will do covid testing on 11/07/19 between 8-1 pm at the [facility]. Pre-procedure instructions were discussed and patient understood.

## 2019-11-06 NOTE — Progress Notes (Signed)
Hematology/Oncology follow-up [facility] Telephone:([phone] [phone]   Patient Care Team: Albina Billet, MD as PCP - General (Internal Medicine) Earlie Server, MD as Consulting Physician (Hematology and Oncology)  REFERRING PROVIDER: Albina Billet, MD  CHIEF COMPLAINTS/REASON FOR VISIT:  Follow-up for kidney cancer  HISTORY OF PRESENTING ILLNESS:   Henry Moore is a  59 y.o.  male with PMH listed below was seen in consultation at the request of  Albina Billet, MD  for evaluation of kidney cancer Extensive medical records were reviewed Patient had MRI lumbar spine without contrast done on 07/13/2018 which showed mired lumbar degenerative disease. CT thoracic spine was done on 08/05/2018 which showed 5 cm left renal mass. 09/04/2018 CT abdomen and pelvis with and without contrast showed 5 cm round enhancing solid mass in the left kidney.  No involvement of left renal vein.  No lymphadenopathy Morphologic changes consistent with cirrhosis and the portal hypertension.  Small volume ascites and splenomegaly. . 10/16/2018 Left nephrectomy showed renal cell carcinoma, clear-cell type, nuclear grade 4, tumor extends into the renal vein and renal sinus fat.  Urethral, vascular and or resection margins are negative for tumor pT3a Nx Mx  01/28/2019 patient had another CT chest abdomen pelvis done with contrast Showed interval left nephrectomy.  Scattered tiny pulmonary nodules bilaterally.  Nonspecific.  No other evidence of metastatic disease.  Cirrhosis changes extensive coronary and aortic atherosclerosis  07/14/2019 patient underwent surveillance CT chest abdomen pelvis without contrast Interval progression of pulmonary metastasis with new enlarged right paratracheal lymph node 1.7 cm, previously 0.6 cm one-point since worrisome for metastatic adenopathy.  Multiple progressive pulmonary nodules, index nodule within the lingula measures 1.3 cm, previously 3  mm. Index subpleural nodule within the anterior right upper lobe measuring 1.8 cm, previously 3 cm Index nodule within the post lateral right lower lobe measuring 5 mm, new from previous care Aortic sclerosis.  Three-vessel coronary artery calcification noted.  Cirrhosis changes.  Splenomegaly.  Patient was referred to establish care with medical oncology for further discussion and evaluation of metastatic renal cell carcinoma. Patient reports feeling well at baseline today.  Denies any shortness of breath, cough, hemoptysis, back pain, headache. He quitted smoking 2015.  Not currently actively drinking alcohol as well. Patient has chronic back pain unchanged.   # Liver cirrhosis with small amount of ascites/splenomegaly patient sees gastroenterology Dr. [name].  . He will get ultrasound surveillance due in February 2021 for screening of [place].  EGD surveillance for Barrett's plan in April 2021.  # Diabetes, on Humalog sliding scale and metformin.  #Family history of cancer and personal history of RCC.  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   INTERVAL HISTORY Henry Moore Person is a 59 y.o. male who has above history reviewed by me today presents for follow up visit for management of stage IV RCC  Problems and complaints are listed below: Patient feels well.  He has no new complaints today.  Review of Systems  Constitutional: Positive for fatigue. Negative for appetite change, chills, fever and unexpected weight change.  HENT:   Negative for hearing loss and voice change.   Eyes: Negative for eye problems and icterus.  Respiratory: Negative for chest tightness, cough and shortness of breath.   Cardiovascular: Negative for chest pain and leg swelling.  Gastrointestinal: Negative for abdominal distention and abdominal pain.  Endocrine: Negative for hot flashes.  Genitourinary: Negative for difficulty urinating, dysuria and frequency.   Musculoskeletal:  Positive for back  pain. Negative for arthralgias.  Skin: Negative for itching and rash.  Neurological: Negative for light-headedness and numbness.  Hematological: Negative for adenopathy. Does not bruise/bleed easily.  Psychiatric/Behavioral: Negative for confusion.    MEDICAL HISTORY:  Past Medical History:  Diagnosis Date  . Cough    SINUS DRAINAGE CAUSING COUGHING , REPORTS CLEAR MUCOUS   . Diabetes mellitus without complication ([place])   . Family history of breast cancer   . Family history of colon cancer   . Family history of stomach cancer   . HTN (hypertension)   . Hyperlipemia   . Left renal mass   . Platelets decreased ([place])    DENIES UNSUAL BLEEDING   . PONV (postoperative nausea and vomiting)   . Renal cell carcinoma ([place]) 08/04/2019  . WBC decreased     SURGICAL HISTORY: Past Surgical History:  Procedure Laterality Date  . ANKLE SURGERY Left   . ANTERIOR CERVICAL DECOMP/DISCECTOMY FUSION N/A 04/16/2014   Procedure: ANTERIOR CERVICAL DECOMPRESSION/DISCECTOMY FUSION  (ACDF C5-C7)   (2 LEVELS)     ;  Surgeon: Melina Schools, MD;  Location: [place];  Service: Orthopedics;  Laterality: N/A;  . APPENDECTOMY    . BACK SURGERY    . ESOPHAGOGASTRODUODENOSCOPY (EGD) WITH PROPOFOL N/A 02/06/2019   Procedure: ESOPHAGOGASTRODUODENOSCOPY (EGD) WITH PROPOFOL;  Surgeon: [name], MD;  Location: [facility] ENDOSCOPY;  Service: Gastroenterology;  Laterality: N/A;  . ESOPHAGOGASTRODUODENOSCOPY (EGD) WITH PROPOFOL N/A 03/04/2019   Procedure: ESOPHAGOGASTRODUODENOSCOPY (EGD) WITH PROPOFOL;  Surgeon: Lin Landsman, MD;  Location: [facility] ENDOSCOPY;  Service: Gastroenterology;  Laterality: N/A;  . ESOPHAGOGASTRODUODENOSCOPY (EGD) WITH PROPOFOL N/A 04/22/2019   Procedure: ESOPHAGOGASTRODUODENOSCOPY (EGD) WITH PROPOFOL;  Surgeon: [name], MD;  Location: [facility] ENDOSCOPY;  Service: Gastroenterology;  Laterality: N/A;  . FRACTURE SURGERY    . HYDROCELE EXCISION    . KNEE ARTHROSCOPY Bilateral   . LAPAROSCOPIC  NEPHRECTOMY, HAND ASSISTED Left 10/16/2018   Procedure: LEFT HAND ASSISTED LAPAROSCOPIC NEPHRECTOMY;  Surgeon: Lucas Mallow, MD;  Location: [facility] ORS;  Service: Urology;  Laterality: Left;  . NASAL SINUS SURGERY     X 2  . 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 SHOULDER SURGERY Left     SOCIAL HISTORY: Social History   Socioeconomic History  . Marital status: Married    Spouse name: Not on file  . Number of children: Not on file  . Years of education: Not on file  . Highest education level: Not on file  Occupational History  . Not on file  Tobacco Use  . Smoking status: Former Smoker    Packs/day: 0.25    Years: 20.00    Pack years: 5.00    Quit date: 2015    Years since quitting: 6.2  . Smokeless tobacco: Never Used  Substance and Sexual Activity  . Alcohol use: Not Currently    Alcohol/week: 0.0 standard drinks    Comment: no alchol in 1 year  . Drug use: No  . Sexual activity: Not on file  Other Topics Concern  . Not on file  Social History Narrative  . Not on file   Social Determinants of Health   Financial Resource Strain:   . Difficulty of Paying Living Expenses:   Food Insecurity:   . Worried About Charity fundraiser in the Last Year:   . Arboriculturist in the Last Year:   Transportation Needs:   . Film/video editor (Medical):   Marland Kitchen  Lack of Transportation (Non-Medical):   Physical Activity:   . Days of Exercise per Week:   . Minutes of Exercise per Session:   Stress:   . Feeling of Stress :   Social Connections:   . Frequency of Communication with Friends and Family:   . Frequency of Social Gatherings with Friends and Family:   . Attends Religious Services:   . Active Member of Clubs or Organizations:   . Attends Archivist Meetings:   Marland Kitchen Marital Status:   Intimate Partner Violence:   . Fear of Current or Ex-Partner:   . Emotionally Abused:   Marland Kitchen Physically Abused:   . Sexually Abused:     FAMILY HISTORY: Family History    Problem Relation Age of Onset  . Diabetes Mother   . Cancer Mother        neuroendocrine cancer  . Cancer Father        neuroendocrine cancer of meninges  . Cancer Maternal Grandmother        tomach dx 58  . Alzheimer's disease Paternal Grandmother   . Lung cancer Paternal Grandfather   . Lung cancer Maternal Aunt   . Colon cancer Maternal Uncle   . Breast cancer Paternal Aunt        both dx 19s  . Ovarian cancer Other        dx 47  . Breast cancer Cousin     ALLERGIES:  is allergic to codeine and mucinex [guaifenesin er].  MEDICATIONS:  Current Outpatient Medications  Medication Sig Dispense Refill  . ACCU-CHEK AVIVA PLUS test strip CHECK BL00D SUGAR ONCE OR TWICE DAILY. (Patient taking differently: 1 each by Other route as needed. ) 100 each PRN  . amLODipine (NORVASC) 10 MG tablet Take 10 mg by mouth daily.    . carvedilol (COREG) 25 MG tablet Take 1 tablet (25 mg total) by mouth 2 (two) times daily with a meal. 180 tablet 3  . glipiZIDE (GLUCOTROL) 5 MG tablet Take 1 tablet (5 mg total) by mouth daily before breakfast. 90 tablet 3  . hydrochlorothiazide (HYDRODIURIL) 25 MG tablet TAKE 1 TABLET DAILY. 90 tablet 0  . insulin lispro (INSULIN LISPRO) 100 UNIT/ML KwikPen Junior Inject into the skin 3 (three) times daily as needed (PER SLIDING SCALE).    Marland Kitchen losartan (COZAAR) 100 MG tablet Take 1 tablet by mouth daily. 90 tablet 0  . lovastatin (MEVACOR) 40 MG tablet Take 1 tablet (40 mg total) by mouth at bedtime. 60 tablet 0  . metFORMIN (GLUCOPHAGE-XR) 500 MG 24 hr tablet Take 500 mg by mouth 2 (two) times daily.     . prochlorperazine (COMPAZINE) 10 MG tablet Take 1 tablet (10 mg total) by mouth every 6 (six) hours as needed for nausea or vomiting. 30 tablet 0   No current facility-administered medications for this visit.     PHYSICAL EXAMINATION: ECOG PERFORMANCE STATUS: 1 - Symptomatic but completely ambulatory There were no vitals filed for this visit. There were no  vitals filed for this visit.  Physical Exam Constitutional:      General: He is not in acute distress.    Appearance: He is obese.  HENT:     Head: Normocephalic and atraumatic.  Eyes:     General: No scleral icterus. Cardiovascular:     Rate and Rhythm: Normal rate and regular rhythm.     Heart sounds: Normal heart sounds.  Pulmonary:     Effort: Pulmonary effort is normal. No respiratory distress.  Breath sounds: No wheezing.  Abdominal:     General: Bowel sounds are normal. There is no distension.     Palpations: Abdomen is soft.  Musculoskeletal:        General: No deformity. Normal range of motion.     Cervical back: Normal range of motion and neck supple.  Skin:    General: Skin is warm and dry.     Findings: No erythema or rash.  Neurological:     Mental Status: He is alert and oriented to person, place, and time. Mental status is at baseline.     Cranial Nerves: No cranial nerve deficit.     Coordination: Coordination normal.  Psychiatric:        Mood and Affect: Mood normal.     LABORATORY DATA:  I have reviewed the data as listed Lab Results  Component Value Date   WBC 2.8 (L) 11/06/2019   HGB 10.7 (L) 11/06/2019   HCT 31.3 (L) 11/06/2019   MCV 92.1 11/06/2019   PLT 62 (L) 11/06/2019   Recent Labs    09/04/19 0804 09/25/19 0759 10/16/19 0813  NA 134* 136 133*  K 4.3 4.4 4.4  CL 104 104 104  CO2 20* 21* 20*  GLUCOSE 117* 117* 127*  BUN 26* 40* 33*  CREATININE 1.67* 1.71* 1.85*  CALCIUM 8.9 8.8* 8.6*  GFRNONAA 45* 44* 40*  GFRAA 52* 51* 46*  PROT 8.0 8.0 7.9  ALBUMIN 4.2 4.1 4.1  AST 27 32 30  ALT 21 23 25   ALKPHOS 114 101 105  BILITOT 0.9 1.0 1.2   Iron/TIBC/Ferritin/ %Sat    Component Value Date/Time   IRON 61 08/21/2019 0843   IRON 105 10/15/2018 1325   TIBC 284 08/21/2019 0843   TIBC 244 (L) 10/15/2018 1325   FERRITIN 237 08/21/2019 0843   FERRITIN 588 (H) 10/15/2018 1325   IRONPCTSAT 22 08/21/2019 0843   IRONPCTSAT 43  10/15/2018 1325      RADIOGRAPHIC STUDIES: I have personally reviewed the radiological images as listed and agreed with the findings in the report. CT ABDOMEN PELVIS WO CONTRAST  Result Date: 10/29/2019 CLINICAL DATA:  Metastatic left renal cell carcinoma, prior nephrectomy. EXAM: CT CHEST, ABDOMEN AND PELVIS WITHOUT CONTRAST TECHNIQUE: Multidetector CT imaging of the chest, abdomen and pelvis was performed following the standard protocol without IV contrast. COMPARISON:  Multiple exams, including 07/14/2019 FINDINGS: CT CHEST FINDINGS Cardiovascular: Coronary, aortic arch, and branch vessel atherosclerotic vascular disease. Mediastinum/Nodes: Right lower paratracheal node 0.9 cm in short axis on image 21/504, previously 1.8 cm by my measurements. Small stable left internal mammary lymph nodes including a 0.6 cm lymph node on image 21/504. Lungs/Pleura: Marked improvement, with resolution of many of the pulmonary nodules, and prominently reduced size of other nodules. For example, a previously dominant 1.4 by 1.2 cm lingular nodule has completely resolved. A subpleural nodule in the right upper lobe on image 38/505 currently measures 0.4 cm in thickness on image 38/505, previously 0.8 cm in thickness. No new nodules identified. Musculoskeletal: Lower cervical plate and screw fixator. Lower thoracic spondylosis. CT ABDOMEN PELVIS FINDINGS Hepatobiliary: Nodular contour and morphology compatible with cirrhosis common no contour change from prior. Suspected gallbladder wall thickening. Pancreas: Unremarkable Spleen: Splenomegaly. The spleen measures 15.4 by 9.8 by 20.0 cm (volume = 1600 cm^3). Adrenals/Urinary Tract: Both adrenal glands appear normal. Unremarkable contour of the right kidney without findings of urinary tract calculi. Left nephrectomy. Stomach/Bowel: Mild sigmoid colon diverticulosis. Vascular/Lymphatic: Aortoiliac atherosclerotic vascular disease. Scattered  small right gastric and  peripancreatic lymph nodes not appreciably changed from prior. No overt pathologic adenopathy observed. Reproductive: Penile implant. Dystrophic calcifications along the prostate gland. Other: Stable mild mesenteric edema. Trace perisplenic ascites. Musculoskeletal: Unremarkable IMPRESSION: 1. Marked improvement, with resolution of many of the pulmonary nodules, and prominently reduced size of other nodules. Considerably reduced size of the right lower paratracheal lymph node, now 0.9 cm in diameter. 2. Hepatic cirrhosis with prominent splenomegaly and trace perisplenic ascites. 3. Other imaging findings of potential clinical significance: Coronary, aortic arch, and branch vessel atherosclerotic vascular disease. Mild sigmoid colon diverticulosis. Aortic Atherosclerosis (ICD10-I70.0). Electronically Signed   By: Van Clines M.D.   On: 10/29/2019 13:27   CT CHEST WO CONTRAST  Result Date: 10/29/2019 CLINICAL DATA:  Metastatic left renal cell carcinoma, prior nephrectomy. EXAM: CT CHEST, ABDOMEN AND PELVIS WITHOUT CONTRAST TECHNIQUE: Multidetector CT imaging of the chest, abdomen and pelvis was performed following the standard protocol without IV contrast. COMPARISON:  Multiple exams, including 07/14/2019 FINDINGS: CT CHEST FINDINGS Cardiovascular: Coronary, aortic arch, and branch vessel atherosclerotic vascular disease. Mediastinum/Nodes: Right lower paratracheal node 0.9 cm in short axis on image 21/504, previously 1.8 cm by my measurements. Small stable left internal mammary lymph nodes including a 0.6 cm lymph node on image 21/504. Lungs/Pleura: Marked improvement, with resolution of many of the pulmonary nodules, and prominently reduced size of other nodules. For example, a previously dominant 1.4 by 1.2 cm lingular nodule has completely resolved. A subpleural nodule in the right upper lobe on image 38/505 currently measures 0.4 cm in thickness on image 38/505, previously 0.8 cm in thickness. No new  nodules identified. Musculoskeletal: Lower cervical plate and screw fixator. Lower thoracic spondylosis. CT ABDOMEN PELVIS FINDINGS Hepatobiliary: Nodular contour and morphology compatible with cirrhosis common no contour change from prior. Suspected gallbladder wall thickening. Pancreas: Unremarkable Spleen: Splenomegaly. The spleen measures 15.4 by 9.8 by 20.0 cm (volume = 1600 cm^3). Adrenals/Urinary Tract: Both adrenal glands appear normal. Unremarkable contour of the right kidney without findings of urinary tract calculi. Left nephrectomy. Stomach/Bowel: Mild sigmoid colon diverticulosis. Vascular/Lymphatic: Aortoiliac atherosclerotic vascular disease. Scattered small right gastric and peripancreatic lymph nodes not appreciably changed from prior. No overt pathologic adenopathy observed. Reproductive: Penile implant. Dystrophic calcifications along the prostate gland. Other: Stable mild mesenteric edema. Trace perisplenic ascites. Musculoskeletal: Unremarkable IMPRESSION: 1. Marked improvement, with resolution of many of the pulmonary nodules, and prominently reduced size of other nodules. Considerably reduced size of the right lower paratracheal lymph node, now 0.9 cm in diameter. 2. Hepatic cirrhosis with prominent splenomegaly and trace perisplenic ascites. 3. Other imaging findings of potential clinical significance: Coronary, aortic arch, and branch vessel atherosclerotic vascular disease. Mild sigmoid colon diverticulosis. Aortic Atherosclerosis (ICD10-I70.0). Electronically Signed   By: Van Clines M.D.   On: 10/29/2019 13:27   NM Bone Scan Whole Body  Result Date: 08/11/2019 CLINICAL DATA:  Renal cell carcinoma. EXAM: NUCLEAR MEDICINE WHOLE BODY BONE SCAN TECHNIQUE: Whole body anterior and posterior images were obtained approximately 3 hours after intravenous injection of radiopharmaceutical. RADIOPHARMACEUTICALS:  23 mCi Technetium-50m MDP IV COMPARISON:  CT chest, abdomen, and pelvis  07/14/2019 FINDINGS: No abnormal foci of increased or decreased radiotracer uptake identified to suggest osseous metastasis. Normal physiologic activity within the right kidney and bladder. Left kidney surgically absent. IMPRESSION: 1. No findings to suggest bone metastases. 2. Left nephrectomy. Electronically Signed   By: Kerby Moors M.D.   On: 08/11/2019 16:06     ASSESSMENT & PLAN:  1. Renal cell carcinoma, unspecified laterality ([place])   2. Encounter for antineoplastic immunotherapy   3. Other cirrhosis of liver ([place])   4. 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 Labs reviewed and discussed with patient. 10/29/2019 CT chest abdomen pelvis without contrast images were independently reviewed by me and discussed with patient.   CT showed marked improvement with resolution of many of the pulmonary nodules, prominent reduced size of other nodules.  Considerably reduced size of the right lower paratracheal lymph node now 0.9 cm in diameter. Hepatic cirrhosis with prominent splenomegaly and trace perisplenic ascites. Chronic CT findings includes aortic atherosclerotic vascular disease.  Mild sigmoid colon diverticulosis. Patient has good response to immunotherapy.  Patient has finished 4 cycles of nivolumab and ipilimumab. Proceed with nivolumab q. 14 days today.  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 LFT is stable. ANC 1.7  #Anemia secondary to chronic kidney disease, hemoglobin is 10.7 Patient prefers to have port placed.  Refer to vascular surgery for Mediport placement. Follow-up 2 weeks lab MD for assessment evaluation prior to the next cycle of nivolumab. All questions were answered. The patient knows to call the  clinic with any problems questions or concerns.   Earlie Server, MD, PhD Hematology Oncology [facility] at [facility] Pager- [phone] 11/06/2019

## 2019-11-06 NOTE — Progress Notes (Signed)
Patient here for follow up. No concerns voiced.  °

## 2019-11-06 NOTE — Telephone Encounter (Signed)
I attempted to contact the patient and a message was left for a return call. 

## 2019-11-07 ENCOUNTER — Other Ambulatory Visit
Admission: RE | Admit: 2019-11-07 | Discharge: 2019-11-07 | Disposition: A | Discharge status: Home / Self Care | Payer: Medicare Other | Source: Ambulatory Visit | Attending: Vascular Surgery | Admitting: Vascular Surgery

## 2019-11-07 DIAGNOSIS — Z20822 Contact with and (suspected) exposure to covid-19: Secondary | ICD-10-CM | POA: Diagnosis not present

## 2019-11-07 DIAGNOSIS — Z01812 Encounter for preprocedural laboratory examination: Secondary | ICD-10-CM | POA: Insufficient documentation

## 2019-11-07 LAB — SARS CORONAVIRUS 2 (TAT 6-24 HRS): SARS Coronavirus 2: NEGATIVE

## 2019-11-10 ENCOUNTER — Other Ambulatory Visit (INDEPENDENT_AMBULATORY_CARE_PROVIDER_SITE_OTHER): Payer: Self-pay | Admitting: Nurse Practitioner

## 2019-11-11 ENCOUNTER — Other Ambulatory Visit: Payer: Self-pay

## 2019-11-11 ENCOUNTER — Encounter: Payer: Self-pay | Admitting: Vascular Surgery

## 2019-11-11 ENCOUNTER — Encounter
Admission: RE | Disposition: A | Discharge status: Home / Self Care | Payer: Self-pay | Source: Home / Self Care | Attending: Vascular Surgery

## 2019-11-11 ENCOUNTER — Ambulatory Visit
Admission: RE | Admit: 2019-11-11 | Discharge: 2019-11-11 | Disposition: A | Discharge status: Home / Self Care | Payer: Medicare Other | Attending: Vascular Surgery | Admitting: Vascular Surgery

## 2019-11-11 DIAGNOSIS — Z87891 Personal history of nicotine dependence: Secondary | ICD-10-CM | POA: Diagnosis not present

## 2019-11-11 DIAGNOSIS — E785 Hyperlipidemia, unspecified: Secondary | ICD-10-CM | POA: Diagnosis not present

## 2019-11-11 DIAGNOSIS — C649 Malignant neoplasm of unspecified kidney, except renal pelvis: Secondary | ICD-10-CM | POA: Insufficient documentation

## 2019-11-11 DIAGNOSIS — K7460 Unspecified cirrhosis of liver: Secondary | ICD-10-CM | POA: Insufficient documentation

## 2019-11-11 DIAGNOSIS — D631 Anemia in chronic kidney disease: Secondary | ICD-10-CM | POA: Insufficient documentation

## 2019-11-11 DIAGNOSIS — I129 Hypertensive chronic kidney disease with stage 1 through stage 4 chronic kidney disease, or unspecified chronic kidney disease: Secondary | ICD-10-CM | POA: Insufficient documentation

## 2019-11-11 DIAGNOSIS — N189 Chronic kidney disease, unspecified: Secondary | ICD-10-CM | POA: Diagnosis not present

## 2019-11-11 DIAGNOSIS — Z7984 Long term (current) use of oral hypoglycemic drugs: Secondary | ICD-10-CM | POA: Diagnosis not present

## 2019-11-11 DIAGNOSIS — E1122 Type 2 diabetes mellitus with diabetic chronic kidney disease: Secondary | ICD-10-CM | POA: Diagnosis not present

## 2019-11-11 DIAGNOSIS — Z79899 Other long term (current) drug therapy: Secondary | ICD-10-CM | POA: Insufficient documentation

## 2019-11-11 DIAGNOSIS — C7800 Secondary malignant neoplasm of unspecified lung: Secondary | ICD-10-CM | POA: Insufficient documentation

## 2019-11-11 DIAGNOSIS — D696 Thrombocytopenia, unspecified: Secondary | ICD-10-CM | POA: Insufficient documentation

## 2019-11-11 HISTORY — PX: PORTA CATH INSERTION: CATH118285

## 2019-11-11 LAB — GLUCOSE, CAPILLARY
Glucose-Capillary: 123 mg/dL — ABNORMAL HIGH (ref 70–99)
Glucose-Capillary: 177 mg/dL — ABNORMAL HIGH (ref 70–99)

## 2019-11-11 SURGERY — PORTA CATH INSERTION
Anesthesia: Moderate Sedation

## 2019-11-11 MED ORDER — FENTANYL CITRATE (PF) 100 MCG/2ML IJ SOLN
INTRAMUSCULAR | Status: AC
  Filled 2019-11-11: qty 2

## 2019-11-11 MED ORDER — MIDAZOLAM HCL 2 MG/2ML IJ SOLN
INTRAMUSCULAR | Status: DC | PRN
  Administered 2019-11-11: 2 mg via INTRAVENOUS
  Administered 2019-11-11: 1 mg via INTRAVENOUS
  Administered 2019-11-11: 2 mg via INTRAVENOUS

## 2019-11-11 MED ORDER — ONDANSETRON HCL 4 MG/2ML IJ SOLN: 4.0000 mg | Freq: Four times a day (QID) | INTRAMUSCULAR | Status: DC | PRN

## 2019-11-11 MED ORDER — DIPHENHYDRAMINE HCL 50 MG/ML IJ SOLN: 50.0000 mg | Freq: Once | INTRAMUSCULAR | Status: DC | PRN

## 2019-11-11 MED ORDER — MIDAZOLAM HCL 2 MG/ML PO SYRP: 8.0000 mg | ORAL_SOLUTION | Freq: Once | ORAL | Status: DC | PRN

## 2019-11-11 MED ORDER — CEFAZOLIN SODIUM-DEXTROSE 2-4 GM/100ML-% IV SOLN
INTRAVENOUS | Status: AC
  Filled 2019-11-11: qty 100

## 2019-11-11 MED ORDER — FENTANYL CITRATE (PF) 100 MCG/2ML IJ SOLN: 12.5000 ug | Freq: Once | INTRAMUSCULAR | Status: DC | PRN

## 2019-11-11 MED ORDER — MIDAZOLAM HCL 5 MG/5ML IJ SOLN
INTRAMUSCULAR | Status: AC
  Filled 2019-11-11: qty 5

## 2019-11-11 MED ORDER — LIDOCAINE-EPINEPHRINE (PF) 1 %-1:200000 IJ SOLN
INTRAMUSCULAR | Status: AC
  Filled 2019-11-11: qty 10

## 2019-11-11 MED ORDER — FAMOTIDINE 20 MG PO TABS: 40.0000 mg | ORAL_TABLET | Freq: Once | ORAL | Status: DC | PRN

## 2019-11-11 MED ORDER — METHYLPREDNISOLONE SODIUM SUCC 125 MG IJ SOLR: 125.0000 mg | Freq: Once | INTRAMUSCULAR | Status: DC | PRN

## 2019-11-11 MED ORDER — SODIUM CHLORIDE 0.9 % IV SOLN: INTRAVENOUS | Status: DC

## 2019-11-11 MED ORDER — FENTANYL CITRATE (PF) 100 MCG/2ML IJ SOLN
INTRAMUSCULAR | Status: DC | PRN
  Administered 2019-11-11: 50 ug via INTRAVENOUS
  Administered 2019-11-11: 25 ug via INTRAVENOUS
  Administered 2019-11-11 (×2): 50 ug via INTRAVENOUS

## 2019-11-11 MED ORDER — CEFAZOLIN SODIUM-DEXTROSE 2-4 GM/100ML-% IV SOLN
2.0000 g | Freq: Once | INTRAVENOUS | Status: AC
  Administered 2019-11-11: 2 g via INTRAVENOUS

## 2019-11-11 SURGICAL SUPPLY — 12 items
DERMABOND ADVANCED (GAUZE/BANDAGES/DRESSINGS) ×1
DERMABOND ADVANCED .7 DNX12 (GAUZE/BANDAGES/DRESSINGS) IMPLANT
DRAPE INCISE IOBAN 66X45 STRL (DRAPES) ×2 IMPLANT
GUIDEWIRE SUPER STIFF .035X180 (WIRE) ×1 IMPLANT
KIT PORT POWER 8FR ISP CVUE (Port) ×1 IMPLANT
NDL ENTRY 21GA 7CM ECHOTIP (NEEDLE) IMPLANT
NEEDLE ENTRY 21GA 7CM ECHOTIP (NEEDLE) ×2 IMPLANT
PACK ANGIOGRAPHY (CUSTOM PROCEDURE TRAY) ×2 IMPLANT
SUT MNCRL AB 4-0 PS2 18 (SUTURE) ×2 IMPLANT
SUT PROLENE 0 CT 1 30 (SUTURE) ×2 IMPLANT
SUT VIC AB 3-0 SH 27 (SUTURE) ×1
SUT VIC AB 3-0 SH 27X BRD (SUTURE) IMPLANT

## 2019-11-11 NOTE — Op Note (Signed)
OPERATIVE NOTE   PROCEDURE: 1. Placement of a right IJ Infuse-a-Port  PRE-OPERATIVE DIAGNOSIS: Renal cell carcinoma  POST-OPERATIVE DIAGNOSIS: Same  SURGEON: Katha Cabal M.D.  ANESTHESIA: Conscious sedation was administered under my direct supervision by the interventional radiology RN. IV Versed plus fentanyl were utilized. Continuous ECG, pulse oximetry and blood pressure was monitored throughout the entire procedure. Conscious sedation was for a total of 30 minutes.  ESTIMATED BLOOD LOSS: Minimal   FINDING(S): 1.  Patent vein  SPECIMEN(S): None  INDICATIONS:   Henry Moore is a 59 y.o. male who presents with renal cell carcinoma.  He will require chemotherapy and therefore requires appropriate parenteral access.  Infuse-a-Port placement is been discussed with the patient he wishes to proceed.  DESCRIPTION: After obtaining full informed written consent, the patient was brought back to the special procedure suite and placed in the supine position. The patient's right neck and chest wall are prepped and draped in sterile fashion. Appropriate timeout was called.  Ultrasound is placed in a sterile sleeve, ultrasound is utilized to avoid vascular injury as well as secondary to lack of appropriate landmarks. The right internal jugular vein is identified. It is echolucent and homogeneous as well as easily compressible indicating patency. An image is recorded for the permanent record.  Access to the vein with a micropuncture needle is done under direct ultrasound visualization.  1% lidocaine is infiltrated into the soft tissue at the base of the neck as well as on the chest wall.  Under direct ultrasound visualization a micro-needle is inserted into the vein followed by the micro-wire. Micro-sheath was then advanced and a J wire is inserted without difficulty under fluoroscopic guidance. A small counterincision was created at the wire insertion site. A transverse incision is  created 2 fingerbreadths below the scapula and a pocket is fashioned using both blunt and sharp dissection. The pocket is tested for appropriate size with the hub of the Infuse-a-Port. The tunneling device is then used to pull the intravascular portion of the catheter from the pocket to the neck counterincision.  Dilator and peel-away sheath were then inserted over the wire and the wire is removed. Catheter is then advanced into the venous system without difficulty. Peel-away sheath was then removed.  Catheter is then positioned under fluoroscopic guidance at the atrial caval junction. It is then transected connected to the hub and the hope is slipped into the subcutaneous pocket on the chest wall. The hub was then accessed percutaneously and aspirates easily and flushes well and is flushed with 30 cc of heparinized saline. The pocket incision is then closed in layers using interrupted 3-0 Vicryl for the subcutaneous tissues and 4-0 Monocryl subcuticular for skin closure. Dermabond is applied. The neck counterincision was closed with 4-0 Monocryl subcuticular and Dermabond as well.  The patient tolerated the procedure well and there were no immediate complications.  COMPLICATIONS: None  CONDITION: Unchanged  Katha Cabal M.D. [facility] Office: [phone]   11/11/2019, 10:54 AM

## 2019-11-11 NOTE — H&P (Signed)
[facility] History & Physical Update  The patient was interviewed and re-examined.  The patient's previous History and Physical has been reviewed and is unchanged.  He has renal cell carcinoma and now requires chemotherapy.  Given this therapeutic option adequate parenteral access is needed and therefore Port-A-Cath placement has been discussed with the patient.  There is no change in the plan of care. We plan to proceed with the scheduled procedure.  Hortencia Pilar, MD  11/11/2019, 9:57 AM

## 2019-11-11 NOTE — Discharge Instructions (Signed)
Implanted Port Insertion, Care After °This sheet gives you information about how to care for yourself after your procedure. Your health care provider may also give you more specific instructions. If you have problems or questions, contact your health care provider. °What can I expect after the procedure? °After the procedure, it is common to have: °· Discomfort at the port insertion site. °· Bruising on the skin over the port. This should improve over 3-4 days. °Follow these instructions at home: °Port care °· After your port is placed, you will get a manufacturer's information card. The card has information about your port. Keep this card with you at all times. °· Take care of the port as told by your health care provider. Ask your health care provider if you or a family member can get training for taking care of the port at home. A home health care nurse may also take care of the port. °· Make sure to remember what type of port you have. °Incision care ° °  ° °· Follow instructions from your health care provider about how to take care of your port insertion site. Make sure you: °? Wash your hands with soap and water before and after you change your bandage (dressing). If soap and water are not available, use hand sanitizer. °? Change your dressing as told by your health care provider. °? Leave stitches (sutures), skin glue, or adhesive strips in place. These skin closures may need to stay in place for 2 weeks or longer. If adhesive strip edges start to loosen and curl up, you may trim the loose edges. Do not remove adhesive strips completely unless your health care provider tells you to do that. °· Check your port insertion site every day for signs of infection. Check for: °? Redness, swelling, or pain. °? Fluid or blood. °? Warmth. °? Pus or a bad smell. °Activity °· Return to your normal activities as told by your health care provider. Ask your health care provider what activities are safe for you. °· Do not  lift anything that is heavier than 10 lb (4.5 kg), or the limit that you are told, until your health care provider says that it is safe. °General instructions °· Take over-the-counter and prescription medicines only as told by your health care provider. °· Do not take baths, swim, or use a hot tub until your health care provider approves. Ask your health care provider if you may take showers. You may only be allowed to take sponge baths. °· Do not drive for 24 hours if you were given a sedative during your procedure. °· Wear a medical alert bracelet in case of an emergency. This will tell any health care providers that you have a port. °· Keep all follow-up visits as told by your health care provider. This is important. °Contact a health care provider if: °· You cannot flush your port with saline as directed, or you cannot draw blood from the port. °· You have a fever or chills. °· You have redness, swelling, or pain around your port insertion site. °· You have fluid or blood coming from your port insertion site. °· Your port insertion site feels warm to the touch. °· You have pus or a bad smell coming from the port insertion site. °Get help right away if: °· You have chest pain or shortness of breath. °· You have bleeding from your port that you cannot control. °Summary °· Take care of the port as told by your health   care provider. Keep the manufacturer's information card with you at all times. °· Change your dressing as told by your health care provider. °· Contact a health care provider if you have a fever or chills or if you have redness, swelling, or pain around your port insertion site. °· Keep all follow-up visits as told by your health care provider. °This information is not intended to replace advice given to you by your health care provider. Make sure you discuss any questions you have with your health care provider. °Document Revised: 02/12/2018 Document Reviewed: 02/12/2018 °[employer] Patient Education ©  2020 [employer] °Moderate Conscious Sedation, Adult, Care After °These instructions provide you with information about caring for yourself after your procedure. Your health care provider may also give you more specific instructions. Your treatment has been planned according to current medical practices, but problems sometimes occur. Call your health care provider if you have any problems or questions after your procedure. °What can I expect after the procedure? °After your procedure, it is common: °· To feel sleepy for several hours. °· To feel clumsy and have poor balance for several hours. °· To have poor judgment for several hours. °· To vomit if you eat too soon. °Follow these instructions at home: °For at least 24 hours after the procedure: ° °· Do not: °? Participate in activities where you could fall or become injured. °? Drive. °? Use heavy machinery. °? Drink alcohol. °? Take sleeping pills or medicines that cause drowsiness. °? Make important decisions or sign legal documents. °? Take care of children on your own. °· Rest. °Eating and drinking °· Follow the diet recommended by your health care provider. °· If you vomit: °? Drink water, juice, or soup when you can drink without vomiting. °? Make sure you have little or no nausea before eating solid foods. °General instructions °· Have a responsible adult stay with you until you are awake and alert. °· Take over-the-counter and prescription medicines only as told by your health care provider. °· If you smoke, do not smoke without supervision. °· Keep all follow-up visits as told by your health care provider. This is important. °Contact a health care provider if: °· You keep feeling nauseous or you keep vomiting. °· You feel light-headed. °· You develop a rash. °· You have a fever. °Get help right away if: °· You have trouble breathing. °This information is not intended to replace advice given to you by your health care provider. Make sure you discuss any  questions you have with your health care provider. °Document Revised: 06/29/2017 Document Reviewed: 11/06/2015 °[employer] Patient Education © 2020 [employer] ° °

## 2019-11-13 NOTE — Progress Notes (Signed)

## 2019-11-20 ENCOUNTER — Encounter: Payer: Self-pay | Admitting: Oncology

## 2019-11-20 ENCOUNTER — Inpatient Hospital Stay: Payer: Medicare Other

## 2019-11-20 ENCOUNTER — Other Ambulatory Visit: Payer: Self-pay

## 2019-11-20 ENCOUNTER — Inpatient Hospital Stay (HOSPITAL_BASED_OUTPATIENT_CLINIC_OR_DEPARTMENT_OTHER): Payer: Medicare Other | Admitting: Oncology

## 2019-11-20 VITALS — BP 154/75 | HR 79 | Temp 96.5°F | Resp 18 | Wt 249.1 lb

## 2019-11-20 DIAGNOSIS — D696 Thrombocytopenia, unspecified: Secondary | ICD-10-CM

## 2019-11-20 DIAGNOSIS — Z5112 Encounter for antineoplastic immunotherapy: Secondary | ICD-10-CM

## 2019-11-20 DIAGNOSIS — K7469 Other cirrhosis of liver: Secondary | ICD-10-CM

## 2019-11-20 DIAGNOSIS — C649 Malignant neoplasm of unspecified kidney, except renal pelvis: Secondary | ICD-10-CM

## 2019-11-20 DIAGNOSIS — C642 Malignant neoplasm of left kidney, except renal pelvis: Secondary | ICD-10-CM | POA: Diagnosis not present

## 2019-11-20 DIAGNOSIS — Z95828 Presence of other vascular implants and grafts: Secondary | ICD-10-CM

## 2019-11-20 LAB — COMPREHENSIVE METABOLIC PANEL
ALT: 23 U/L (ref 0–44)
AST: 32 U/L (ref 15–41)
Albumin: 4.2 g/dL (ref 3.5–5.0)
Alkaline Phosphatase: 116 U/L (ref 38–126)
Anion gap: 10 (ref 5–15)
BUN: 34 mg/dL — ABNORMAL HIGH (ref 6–20)
CO2: 20 mmol/L — ABNORMAL LOW (ref 22–32)
Calcium: 9.3 mg/dL (ref 8.9–10.3)
Chloride: 105 mmol/L (ref 98–111)
Creatinine, Ser: 1.82 mg/dL — ABNORMAL HIGH (ref 0.61–1.24)
GFR calc Af Amer: 47 mL/min — ABNORMAL LOW (ref >60)
GFR calc non Af Amer: 41 mL/min — ABNORMAL LOW (ref >60)
Glucose, Bld: 128 mg/dL — ABNORMAL HIGH (ref 70–99)
Potassium: 4.5 mmol/L (ref 3.5–5.1)
Sodium: 135 mmol/L (ref 135–145)
Total Bilirubin: 1 mg/dL (ref 0.3–1.2)
Total Protein: 8.3 g/dL — ABNORMAL HIGH (ref 6.5–8.1)

## 2019-11-20 LAB — CBC WITH DIFFERENTIAL/PLATELET
Abs Immature Granulocytes: 0.01 10*3/uL (ref 0.00–0.07)
Basophils Absolute: 0 10*3/uL (ref 0.0–0.1)
Basophils Relative: 1 %
Eosinophils Absolute: 0.2 10*3/uL (ref 0.0–0.5)
Eosinophils Relative: 6 %
HCT: 30.1 % — ABNORMAL LOW (ref 39.0–52.0)
Hemoglobin: 10.6 g/dL — ABNORMAL LOW (ref 13.0–17.0)
Immature Granulocytes: 0 %
Lymphocytes Relative: 21 %
Lymphs Abs: 0.5 10*3/uL — ABNORMAL LOW (ref 0.7–4.0)
MCH: 31.7 pg (ref 26.0–34.0)
MCHC: 35.2 g/dL (ref 30.0–36.0)
MCV: 90.1 fL (ref 80.0–100.0)
Monocytes Absolute: 0.4 10*3/uL (ref 0.1–1.0)
Monocytes Relative: 17 %
Neutro Abs: 1.3 10*3/uL — ABNORMAL LOW (ref 1.7–7.7)
Neutrophils Relative %: 55 %
Platelets: 69 10*3/uL — ABNORMAL LOW (ref 150–400)
RBC: 3.34 MIL/uL — ABNORMAL LOW (ref 4.22–5.81)
RDW: 13.1 % (ref 11.5–15.5)
WBC: 2.5 10*3/uL — ABNORMAL LOW (ref 4.0–10.5)
nRBC: 0 % (ref 0.0–0.2)

## 2019-11-20 MED ORDER — SODIUM CHLORIDE 0.9 % IV SOLN
240.0000 mg | Freq: Once | INTRAVENOUS | Status: AC
  Administered 2019-11-20: 240 mg via INTRAVENOUS
  Filled 2019-11-20: qty 24

## 2019-11-20 MED ORDER — SODIUM CHLORIDE 0.9 % IV SOLN
Freq: Once | INTRAVENOUS | Status: AC
  Filled 2019-11-20: qty 250

## 2019-11-20 MED ORDER — HEPARIN SOD (PORK) LOCK FLUSH 100 UNIT/ML IV SOLN
500.0000 [IU] | Freq: Once | INTRAVENOUS | Status: DC | PRN
  Filled 2019-11-20: qty 5

## 2019-11-20 MED ORDER — HEPARIN SOD (PORK) LOCK FLUSH 100 UNIT/ML IV SOLN
500.0000 [IU] | Freq: Once | INTRAVENOUS | Status: AC
  Administered 2019-11-20: 500 [IU] via INTRAVENOUS
  Filled 2019-11-20: qty 5

## 2019-11-20 MED ORDER — SODIUM CHLORIDE 0.9% FLUSH
10.0000 mL | Freq: Once | INTRAVENOUS | Status: AC
  Administered 2019-11-20: 10 mL via INTRAVENOUS
  Filled 2019-11-20: qty 10

## 2019-11-20 NOTE — Progress Notes (Signed)
Per MD treatment conditions okay to proceed with 11/20/2019 labs.

## 2019-11-20 NOTE — Progress Notes (Signed)
Patietn here for follow up and tx. He had port placed last Tuesday (4/13). No concerns voiced. Blood pressure elevated, but he reports that he has not taken bp medication today. Denies feeling dizzy or lightheaded, no headache.

## 2019-11-21 DIAGNOSIS — Z978 Presence of other specified devices: Secondary | ICD-10-CM | POA: Insufficient documentation

## 2019-11-21 DIAGNOSIS — Z95828 Presence of other vascular implants and grafts: Secondary | ICD-10-CM | POA: Insufficient documentation

## 2019-11-21 NOTE — Progress Notes (Signed)
Hematology/Oncology follow-up [facility] Telephone:([phone] [phone]   Patient Care Team: Albina Billet, MD as PCP - General (Internal Medicine) Earlie Server, MD as Consulting Physician (Hematology and Oncology)  REFERRING PROVIDER: Albina Billet, MD  CHIEF COMPLAINTS/REASON FOR VISIT:  Follow-up for kidney cancer  HISTORY OF PRESENTING ILLNESS:   Henry Moore is a  59 y.o.  male with PMH listed below was seen in consultation at the request of  Albina Billet, MD  for evaluation of kidney cancer Extensive medical records were reviewed Patient had MRI lumbar spine without contrast done on 07/13/2018 which showed mired lumbar degenerative disease. CT thoracic spine was done on 08/05/2018 which showed 5 cm left renal mass. 09/04/2018 CT abdomen and pelvis with and without contrast showed 5 cm round enhancing solid mass in the left kidney.  No involvement of left renal vein.  No lymphadenopathy Morphologic changes consistent with cirrhosis and the portal hypertension.  Small volume ascites and splenomegaly. . 10/16/2018 Left nephrectomy showed renal cell carcinoma, clear-cell type, nuclear grade 4, tumor extends into the renal vein and renal sinus fat.  Urethral, vascular and or resection margins are negative for tumor pT3a Nx Mx  01/28/2019 patient had another CT chest abdomen pelvis done with contrast Showed interval left nephrectomy.  Scattered tiny pulmonary nodules bilaterally.  Nonspecific.  No other evidence of metastatic disease.  Cirrhosis changes extensive coronary and aortic atherosclerosis  07/14/2019 patient underwent surveillance CT chest abdomen pelvis without contrast Interval progression of pulmonary metastasis with new enlarged right paratracheal lymph node 1.7 cm, previously 0.6 cm one-point since worrisome for metastatic adenopathy.  Multiple progressive pulmonary nodules, index nodule within the lingula measures 1.3 cm, previously 3  mm. Index subpleural nodule within the anterior right upper lobe measuring 1.8 cm, previously 3 cm Index nodule within the post lateral right lower lobe measuring 5 mm, new from previous care Aortic sclerosis.  Three-vessel coronary artery calcification noted.  Cirrhosis changes.  Splenomegaly.  Patient was referred to establish care with medical oncology for further discussion and evaluation of metastatic renal cell carcinoma. Patient reports feeling well at baseline today.  Denies any shortness of breath, cough, hemoptysis, back pain, headache. He quitted smoking 2015.  Not currently actively drinking alcohol as well. Patient has chronic back pain unchanged.  # Liver cirrhosis with small amount of ascites/splenomegaly patient sees gastroenterology Dr. [name].  . He will get ultrasound surveillance due in February 2021 for screening of Jackson.  EGD surveillance for Barrett's plan in April 2021.  # Diabetes, on Humalog sliding scale and metformin.  #Family history of cancer and personal history of RCC.  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   # 10/29/2019 CT chest abdomen pelvis without contrast images were independently reviewed by me and discussed with patient.   CT showed marked improvement with resolution of many of the pulmonary nodules, prominent reduced size of other nodules.  Considerably reduced size of the right lower paratracheal lymph node now 0.9 cm in diameter. Hepatic cirrhosis with prominent splenomegaly and trace perisplenic ascites. Chronic CT findings includes aortic atherosclerotic vascular disease.  Mild sigmoid colon diverticulosis  INTERVAL HISTORY Henry Moore is a 59 y.o. male who has above history reviewed by me today presents for follow up visit for management of stage IV RCC  Problems and complaints are listed below: Patient reports feeling well.  He has no new complaints.  Tolerated immunotherapy. Review of Systems  Constitutional: Positive  for  fatigue. Negative for appetite change, chills, fever and unexpected weight change.  HENT:   Negative for hearing loss and voice change.   Eyes: Negative for eye problems and icterus.  Respiratory: Negative for chest tightness, cough and shortness of breath.   Cardiovascular: Negative for chest pain and leg swelling.  Gastrointestinal: Negative for abdominal distention and abdominal pain.  Endocrine: Negative for hot flashes.  Genitourinary: Negative for difficulty urinating, dysuria and frequency.   Musculoskeletal: Positive for back pain. Negative for arthralgias.  Skin: Negative for itching and rash.  Neurological: Negative for light-headedness and numbness.  Hematological: Negative for adenopathy. Does not bruise/bleed easily.  Psychiatric/Behavioral: Negative for confusion.    MEDICAL HISTORY:  Past Medical History:  Diagnosis Date   Cough    SINUS DRAINAGE CAUSING COUGHING , REPORTS CLEAR MUCOUS    Diabetes mellitus without complication (HCC)    Family history of breast cancer    Family history of colon cancer    Family history of stomach cancer    HTN (hypertension)    Hyperlipemia    Left renal mass    Platelets decreased ([place])    DENIES UNSUAL BLEEDING    PONV (postoperative nausea and vomiting)    Renal cell carcinoma ([place]) 08/04/2019   WBC decreased     SURGICAL HISTORY: Past Surgical History:  Procedure Laterality Date   ANKLE SURGERY Left    ANTERIOR CERVICAL DECOMP/DISCECTOMY FUSION N/A 04/16/2014   Procedure: ANTERIOR CERVICAL DECOMPRESSION/DISCECTOMY FUSION  (ACDF C5-C7)   (2 LEVELS)     ;  Surgeon: Melina Schools, MD;  Location: [place];  Service: Orthopedics;  Laterality: N/A;   APPENDECTOMY     BACK SURGERY     ESOPHAGOGASTRODUODENOSCOPY (EGD) WITH PROPOFOL N/A 02/06/2019   Procedure: ESOPHAGOGASTRODUODENOSCOPY (EGD) WITH PROPOFOL;  Surgeon: [name], MD;  Location: [facility] ENDOSCOPY;  Service: Gastroenterology;  Laterality: N/A;    ESOPHAGOGASTRODUODENOSCOPY (EGD) WITH PROPOFOL N/A 03/04/2019   Procedure: ESOPHAGOGASTRODUODENOSCOPY (EGD) WITH PROPOFOL;  Surgeon: Lin Landsman, MD;  Location: [place];  Service: Gastroenterology;  Laterality: N/A;   ESOPHAGOGASTRODUODENOSCOPY (EGD) WITH PROPOFOL N/A 04/22/2019   Procedure: ESOPHAGOGASTRODUODENOSCOPY (EGD) WITH PROPOFOL;  Surgeon: [name], MD;  Location[facility] ENDOSCOPY;  Service: Gastroenterology;  Laterality: N/A;   FRACTURE SURGERY     HYDROCELE EXCISION     KNEE ARTHROSCOPY Bilateral    LAPAROSCOPIC NEPHRECTOMY, HAND ASSISTED Left 10/16/2018   Procedure: LEFT HAND ASSISTED LAPAROSCOPIC NEPHRECTOMY;  Surgeon: Lucas Mallow, MD;  Location: [facility];  Service: Urology;  Laterality: Left;   NASAL SINUS SURGERY     X 2   PENILE PROSTHESIS IMPLANT  10 YEARS AGO   PORTA CATH INSERTION N/A 11/11/2019   Procedure: PORTA CATH INSERTION;  Surgeon: Katha Cabal, MD;  Location: [place] [facility];  Service: Cardiovascular;  Laterality: N/A;   SHOULDER SURGERY Left     SOCIAL HISTORY: Social History   Socioeconomic History   Marital status: Married    Spouse name: Not on file   Number of children: Not on file   Years of education: Not on file   Highest education level: Not on file  Occupational History   Not on file  Tobacco Use   Smoking status: Former Smoker    Packs/day: 0.25    Years: 20.00    Pack years: 5.00    Quit date: 2015    Years since quitting: 6.3   Smokeless tobacco: Never Used  Substance and Sexual Activity   Alcohol use:  Not Currently    Alcohol/week: 0.0 standard drinks    Comment: no alchol in 1 year   Drug use: No   Sexual activity: Not on file  Other Topics Concern   Not on file  Social History Narrative   Not on file   Social Determinants of Health   Financial Resource Strain:    Difficulty of Paying Living Expenses:   Food Insecurity:    Worried About Charity fundraiser in the Last Year:     Arboriculturist in the Last Year:   Transportation Needs:    Film/video editor (Medical):    Lack of Transportation (Non-Medical):   Physical Activity:    Days of Exercise per Week:    Minutes of Exercise per Session:   Stress:    Feeling of Stress :   Social Connections:    Frequency of Communication with Friends and Family:    Frequency of Social Gatherings with Friends and Family:    Attends Religious Services:    Active Member of Clubs or Organizations:    Attends Music therapist:    Marital Status:   Intimate Partner Violence:    Fear of Current or Ex-Partner:    Emotionally Abused:    Physically Abused:    Sexually Abused:     FAMILY HISTORY: Family History  Problem Relation Age of Onset   Diabetes Mother    Cancer Mother        neuroendocrine cancer   Cancer Father        neuroendocrine cancer of meninges   Cancer Maternal Grandmother        tomach dx 56   Alzheimer's disease Paternal Grandmother    Lung cancer Paternal Grandfather    Lung cancer Maternal Aunt    Colon cancer Maternal Uncle    Breast cancer Paternal Aunt        both dx 39s   Ovarian cancer Other        dx 44   Breast cancer Cousin     ALLERGIES:  is allergic to codeine and mucinex [guaifenesin er].  MEDICATIONS:  Current Outpatient Medications  Medication Sig Dispense Refill   ACCU-CHEK AVIVA PLUS test strip CHECK BL00D SUGAR ONCE OR TWICE DAILY. (Patient taking differently: 1 each by Other route as needed. ) 100 each PRN   amLODipine (NORVASC) 10 MG tablet Take 10 mg by mouth daily.     carvedilol (COREG) 25 MG tablet Take 1 tablet (25 mg total) by mouth 2 (two) times daily with a meal. 180 tablet 3   Cholecalciferol (VITAMIN D3) 125 MCG (5000 UT) CAPS Take 5,000 Units by mouth daily.     glipiZIDE (GLUCOTROL) 5 MG tablet Take 1 tablet (5 mg total) by mouth daily before breakfast. 90 tablet 3   hydrochlorothiazide (HYDRODIURIL) 25 MG  tablet TAKE 1 TABLET DAILY. (Patient taking differently: Take 25 mg by mouth daily. ) 90 tablet 0   insulin lispro (INSULIN LISPRO) 100 UNIT/ML KwikPen Junior Inject into the skin 3 (three) times daily as needed (Above 250). Sliding scale     losartan (COZAAR) 100 MG tablet Take 1 tablet by mouth daily. (Patient taking differently: Take 100 mg by mouth daily. ) 90 tablet 0   lovastatin (MEVACOR) 40 MG tablet Take 1 tablet (40 mg total) by mouth at bedtime. 60 tablet 0   metFORMIN (GLUCOPHAGE-XR) 500 MG 24 hr tablet Take 500 mg by mouth 2 (two) times daily.  prochlorperazine (COMPAZINE) 10 MG tablet Take 1 tablet (10 mg total) by mouth every 6 (six) hours as needed for nausea or vomiting. 30 tablet 0   No current facility-administered medications for this visit.     PHYSICAL EXAMINATION: ECOG PERFORMANCE STATUS: 1 - Symptomatic but completely ambulatory Vitals:   11/20/19 0907  BP: (!) 154/75  Pulse: 79  Resp: 18  Temp: (!) 96.5 F (35.8 C)   Filed Weights   11/20/19 0907  Weight: 249 lb 1.6 oz (113 kg)    Physical Exam Constitutional:      General: He is not in acute distress.    Appearance: He is obese.  HENT:     Head: Normocephalic and atraumatic.  Eyes:     General: No scleral icterus. Cardiovascular:     Rate and Rhythm: Normal rate and regular rhythm.     Heart sounds: Normal heart sounds.  Pulmonary:     Effort: Pulmonary effort is normal. No respiratory distress.     Breath sounds: No wheezing.  Abdominal:     General: Bowel sounds are normal. There is no distension.     Palpations: Abdomen is soft.  Musculoskeletal:        General: No deformity. Normal range of motion.     Cervical back: Normal range of motion and neck supple.  Skin:    General: Skin is warm and dry.     Findings: No erythema or rash.  Neurological:     Mental Status: He is alert and oriented to person, place, and time. Mental status is at baseline.     Cranial Nerves: No cranial  nerve deficit.     Coordination: Coordination normal.  Psychiatric:        Mood and Affect: Mood normal.     LABORATORY DATA:  I have reviewed the data as listed Lab Results  Component Value Date   WBC 2.5 (L) 11/20/2019   HGB 10.6 (L) 11/20/2019   HCT 30.1 (L) 11/20/2019   MCV 90.1 11/20/2019   PLT 69 (L) 11/20/2019   Recent Labs    10/16/19 0813 11/06/19 0805 11/20/19 0853  NA 133* 136 135  K 4.4 4.4 4.5  CL 104 104 105  CO2 20* 21* 20*  GLUCOSE 127* 123* 128*  BUN 33* 34* 34*  CREATININE 1.85* 1.73* 1.82*  CALCIUM 8.6* 9.3 9.3  GFRNONAA 40* 43* 41*  GFRAA 46* 50* 47*  PROT 7.9 8.2* 8.3*  ALBUMIN 4.1 4.2 4.2  AST 30 30 32  ALT 25 23 23   ALKPHOS 105 112 116  BILITOT 1.2 0.9 1.0   Iron/TIBC/Ferritin/ %Sat    Component Value Date/Time   IRON 61 08/21/2019 0843   IRON 105 10/15/2018 1325   TIBC 284 08/21/2019 0843   TIBC 244 (L) 10/15/2018 1325   FERRITIN 237 08/21/2019 0843   FERRITIN 588 (H) 10/15/2018 1325   IRONPCTSAT 22 08/21/2019 0843   IRONPCTSAT 43 10/15/2018 1325      RADIOGRAPHIC STUDIES: I have personally reviewed the radiological images as listed and agreed with the findings in the report. CT ABDOMEN PELVIS WO CONTRAST  Result Date: 10/29/2019 CLINICAL DATA:  Metastatic left renal cell carcinoma, prior nephrectomy. EXAM: CT CHEST, ABDOMEN AND PELVIS WITHOUT CONTRAST TECHNIQUE: Multidetector CT imaging of the chest, abdomen and pelvis was performed following the standard protocol without IV contrast. COMPARISON:  Multiple exams, including 07/14/2019 FINDINGS: CT CHEST FINDINGS Cardiovascular: Coronary, aortic arch, and branch vessel atherosclerotic vascular disease. Mediastinum/Nodes: Right lower paratracheal  node 0.9 cm in short axis on image 21/504, previously 1.8 cm by my measurements. Small stable left internal mammary lymph nodes including a 0.6 cm lymph node on image 21/504. Lungs/Pleura: Marked improvement, with resolution of many of the  pulmonary nodules, and prominently reduced size of other nodules. For example, a previously dominant 1.4 by 1.2 cm lingular nodule has completely resolved. A subpleural nodule in the right upper lobe on image 38/505 currently measures 0.4 cm in thickness on image 38/505, previously 0.8 cm in thickness. No new nodules identified. Musculoskeletal: Lower cervical plate and screw fixator. Lower thoracic spondylosis. CT ABDOMEN PELVIS FINDINGS Hepatobiliary: Nodular contour and morphology compatible with cirrhosis common no contour change from prior. Suspected gallbladder wall thickening. Pancreas: Unremarkable Spleen: Splenomegaly. The spleen measures 15.4 by 9.8 by 20.0 cm (volume = 1600 cm^3). Adrenals/Urinary Tract: Both adrenal glands appear normal. Unremarkable contour of the right kidney without findings of urinary tract calculi. Left nephrectomy. Stomach/Bowel: Mild sigmoid colon diverticulosis. Vascular/Lymphatic: Aortoiliac atherosclerotic vascular disease. Scattered small right gastric and peripancreatic lymph nodes not appreciably changed from prior. No overt pathologic adenopathy observed. Reproductive: Penile implant. Dystrophic calcifications along the prostate gland. Other: Stable mild mesenteric edema. Trace perisplenic ascites. Musculoskeletal: Unremarkable IMPRESSION: 1. Marked improvement, with resolution of many of the pulmonary nodules, and prominently reduced size of other nodules. Considerably reduced size of the right lower paratracheal lymph node, now 0.9 cm in diameter. 2. Hepatic cirrhosis with prominent splenomegaly and trace perisplenic ascites. 3. Other imaging findings of potential clinical significance: Coronary, aortic arch, and branch vessel atherosclerotic vascular disease. Mild sigmoid colon diverticulosis. Aortic Atherosclerosis (ICD10-I70.0). Electronically Signed   By: Van Clines M.D.   On: 10/29/2019 13:27   CT CHEST WO CONTRAST  Result Date: 10/29/2019 CLINICAL DATA:   Metastatic left renal cell carcinoma, prior nephrectomy. EXAM: CT CHEST, ABDOMEN AND PELVIS WITHOUT CONTRAST TECHNIQUE: Multidetector CT imaging of the chest, abdomen and pelvis was performed following the standard protocol without IV contrast. COMPARISON:  Multiple exams, including 07/14/2019 FINDINGS: CT CHEST FINDINGS Cardiovascular: Coronary, aortic arch, and branch vessel atherosclerotic vascular disease. Mediastinum/Nodes: Right lower paratracheal node 0.9 cm in short axis on image 21/504, previously 1.8 cm by my measurements. Small stable left internal mammary lymph nodes including a 0.6 cm lymph node on image 21/504. Lungs/Pleura: Marked improvement, with resolution of many of the pulmonary nodules, and prominently reduced size of other nodules. For example, a previously dominant 1.4 by 1.2 cm lingular nodule has completely resolved. A subpleural nodule in the right upper lobe on image 38/505 currently measures 0.4 cm in thickness on image 38/505, previously 0.8 cm in thickness. No new nodules identified. Musculoskeletal: Lower cervical plate and screw fixator. Lower thoracic spondylosis. CT ABDOMEN PELVIS FINDINGS Hepatobiliary: Nodular contour and morphology compatible with cirrhosis common no contour change from prior. Suspected gallbladder wall thickening. Pancreas: Unremarkable Spleen: Splenomegaly. The spleen measures 15.4 by 9.8 by 20.0 cm (volume = 1600 cm^3). Adrenals/Urinary Tract: Both adrenal glands appear normal. Unremarkable contour of the right kidney without findings of urinary tract calculi. Left nephrectomy. Stomach/Bowel: Mild sigmoid colon diverticulosis. Vascular/Lymphatic: Aortoiliac atherosclerotic vascular disease. Scattered small right gastric and peripancreatic lymph nodes not appreciably changed from prior. No overt pathologic adenopathy observed. Reproductive: Penile implant. Dystrophic calcifications along the prostate gland. Other: Stable mild mesenteric edema. Trace perisplenic  ascites. Musculoskeletal: Unremarkable IMPRESSION: 1. Marked improvement, with resolution of many of the pulmonary nodules, and prominently reduced size of other nodules. Considerably reduced size of the right lower  paratracheal lymph node, now 0.9 cm in diameter. 2. Hepatic cirrhosis with prominent splenomegaly and trace perisplenic ascites. 3. Other imaging findings of potential clinical significance: Coronary, aortic arch, and branch vessel atherosclerotic vascular disease. Mild sigmoid colon diverticulosis. Aortic Atherosclerosis (ICD10-I70.0). Electronically Signed   By: Van Clines M.D.   On: 10/29/2019 13:27   PERIPHERAL VASCULAR CATHETERIZATION  Result Date: 11/11/2019 See op note    ASSESSMENT & PLAN:  1. Renal cell carcinoma, unspecified laterality ([place])   2. Encounter for antineoplastic immunotherapy   3. Other cirrhosis of liver ([place])   4. 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 Interval CT scan showed partial response. He tolerates immunotherapy well. Labs are reviewed and discussed with patient .  Proceed with nivolumab today.  #Chronic liver cirrhosis with thrombocytopenia and neutropenia.  Platelet counts are stable. ANC 1.3. . #Anemia secondary to chronic kidney disease, hemoglobin is 10.6 #Port-A-Cath in place, Follow-up 2 weeks lab MD for assessment evaluation prior to the next cycle of nivolumab. All questions were answered. The patient knows to call the clinic with any problems questions or concerns.   Earlie Server, MD, PhD Hematology Oncology [facility] Pager- [phone] 11/21/2019

## 2019-11-27 NOTE — Progress Notes (Signed)
Pharmacist Chemotherapy Monitoring - Follow Up Assessment    I verify that I have reviewed each item in the below checklist:  . Regimen for the patient is scheduled for the appropriate day and plan matches scheduled date. Marland Kitchen Appropriate non-routine labs are ordered dependent on drug ordered. . If applicable, additional medications reviewed and ordered per protocol based on lifetime cumulative doses and/or treatment regimen.   Plan for follow-up and/or issues identified: Yes - need orders . I-vent associated with next due treatment: No   Henry Moore 11/27/2019 8:48 AM

## 2019-12-04 ENCOUNTER — Inpatient Hospital Stay (HOSPITAL_BASED_OUTPATIENT_CLINIC_OR_DEPARTMENT_OTHER): Payer: BC Managed Care – PPO | Admitting: Oncology

## 2019-12-04 ENCOUNTER — Inpatient Hospital Stay: Payer: BC Managed Care – PPO

## 2019-12-04 ENCOUNTER — Other Ambulatory Visit: Payer: Self-pay

## 2019-12-04 ENCOUNTER — Encounter: Payer: Self-pay | Admitting: Oncology

## 2019-12-04 ENCOUNTER — Inpatient Hospital Stay: Payer: BC Managed Care – PPO | Attending: Oncology

## 2019-12-04 VITALS — BP 110/56 | HR 70 | Temp 95.6°F | Resp 18 | Wt 249.4 lb

## 2019-12-04 DIAGNOSIS — Z800 Family history of malignant neoplasm of digestive organs: Secondary | ICD-10-CM | POA: Insufficient documentation

## 2019-12-04 DIAGNOSIS — Z5112 Encounter for antineoplastic immunotherapy: Secondary | ICD-10-CM

## 2019-12-04 DIAGNOSIS — K5730 Diverticulosis of large intestine without perforation or abscess without bleeding: Secondary | ICD-10-CM | POA: Diagnosis not present

## 2019-12-04 DIAGNOSIS — Z803 Family history of malignant neoplasm of breast: Secondary | ICD-10-CM | POA: Insufficient documentation

## 2019-12-04 DIAGNOSIS — I2510 Atherosclerotic heart disease of native coronary artery without angina pectoris: Secondary | ICD-10-CM | POA: Insufficient documentation

## 2019-12-04 DIAGNOSIS — C649 Malignant neoplasm of unspecified kidney, except renal pelvis: Secondary | ICD-10-CM

## 2019-12-04 DIAGNOSIS — K7469 Other cirrhosis of liver: Secondary | ICD-10-CM | POA: Insufficient documentation

## 2019-12-04 DIAGNOSIS — M549 Dorsalgia, unspecified: Secondary | ICD-10-CM | POA: Diagnosis not present

## 2019-12-04 DIAGNOSIS — E785 Hyperlipidemia, unspecified: Secondary | ICD-10-CM | POA: Insufficient documentation

## 2019-12-04 DIAGNOSIS — Z905 Acquired absence of kidney: Secondary | ICD-10-CM | POA: Insufficient documentation

## 2019-12-04 DIAGNOSIS — R7989 Other specified abnormal findings of blood chemistry: Secondary | ICD-10-CM

## 2019-12-04 DIAGNOSIS — R188 Other ascites: Secondary | ICD-10-CM | POA: Insufficient documentation

## 2019-12-04 DIAGNOSIS — E1122 Type 2 diabetes mellitus with diabetic chronic kidney disease: Secondary | ICD-10-CM | POA: Diagnosis not present

## 2019-12-04 DIAGNOSIS — I700 Atherosclerosis of aorta: Secondary | ICD-10-CM | POA: Diagnosis not present

## 2019-12-04 DIAGNOSIS — R161 Splenomegaly, not elsewhere classified: Secondary | ICD-10-CM | POA: Diagnosis not present

## 2019-12-04 DIAGNOSIS — G8929 Other chronic pain: Secondary | ICD-10-CM | POA: Insufficient documentation

## 2019-12-04 DIAGNOSIS — N1832 Chronic kidney disease, stage 3b: Secondary | ICD-10-CM

## 2019-12-04 DIAGNOSIS — Z794 Long term (current) use of insulin: Secondary | ICD-10-CM | POA: Diagnosis not present

## 2019-12-04 DIAGNOSIS — Z95828 Presence of other vascular implants and grafts: Secondary | ICD-10-CM

## 2019-12-04 DIAGNOSIS — Z87891 Personal history of nicotine dependence: Secondary | ICD-10-CM | POA: Insufficient documentation

## 2019-12-04 DIAGNOSIS — I10 Essential (primary) hypertension: Secondary | ICD-10-CM | POA: Insufficient documentation

## 2019-12-04 DIAGNOSIS — D696 Thrombocytopenia, unspecified: Secondary | ICD-10-CM | POA: Diagnosis not present

## 2019-12-04 DIAGNOSIS — C642 Malignant neoplasm of left kidney, except renal pelvis: Secondary | ICD-10-CM | POA: Diagnosis not present

## 2019-12-04 DIAGNOSIS — Z79899 Other long term (current) drug therapy: Secondary | ICD-10-CM | POA: Diagnosis not present

## 2019-12-04 LAB — CBC WITH DIFFERENTIAL/PLATELET
Abs Immature Granulocytes: 0.01 10*3/uL (ref 0.00–0.07)
Basophils Absolute: 0 10*3/uL (ref 0.0–0.1)
Basophils Relative: 2 %
Eosinophils Absolute: 0.1 10*3/uL (ref 0.0–0.5)
Eosinophils Relative: 6 %
HCT: 28.5 % — ABNORMAL LOW (ref 39.0–52.0)
Hemoglobin: 9.9 g/dL — ABNORMAL LOW (ref 13.0–17.0)
Immature Granulocytes: 1 %
Lymphocytes Relative: 23 %
Lymphs Abs: 0.5 10*3/uL — ABNORMAL LOW (ref 0.7–4.0)
MCH: 31.7 pg (ref 26.0–34.0)
MCHC: 34.7 g/dL (ref 30.0–36.0)
MCV: 91.3 fL (ref 80.0–100.0)
Monocytes Absolute: 0.3 10*3/uL (ref 0.1–1.0)
Monocytes Relative: 13 %
Neutro Abs: 1.2 10*3/uL — ABNORMAL LOW (ref 1.7–7.7)
Neutrophils Relative %: 55 %
Platelets: 61 10*3/uL — ABNORMAL LOW (ref 150–400)
RBC: 3.12 MIL/uL — ABNORMAL LOW (ref 4.22–5.81)
RDW: 13.2 % (ref 11.5–15.5)
WBC: 2.1 10*3/uL — ABNORMAL LOW (ref 4.0–10.5)
nRBC: 0 % (ref 0.0–0.2)

## 2019-12-04 LAB — COMPREHENSIVE METABOLIC PANEL
ALT: 21 U/L (ref 0–44)
AST: 31 U/L (ref 15–41)
Albumin: 4.1 g/dL (ref 3.5–5.0)
Alkaline Phosphatase: 101 U/L (ref 38–126)
Anion gap: 9 (ref 5–15)
BUN: 35 mg/dL — ABNORMAL HIGH (ref 6–20)
CO2: 21 mmol/L — ABNORMAL LOW (ref 22–32)
Calcium: 8.8 mg/dL — ABNORMAL LOW (ref 8.9–10.3)
Chloride: 106 mmol/L (ref 98–111)
Creatinine, Ser: 2.04 mg/dL — ABNORMAL HIGH (ref 0.61–1.24)
GFR calc Af Amer: 41 mL/min — ABNORMAL LOW (ref >60)
GFR calc non Af Amer: 35 mL/min — ABNORMAL LOW (ref >60)
Glucose, Bld: 79 mg/dL (ref 70–99)
Potassium: 4.3 mmol/L (ref 3.5–5.1)
Sodium: 136 mmol/L (ref 135–145)
Total Bilirubin: 1.1 mg/dL (ref 0.3–1.2)
Total Protein: 8 g/dL (ref 6.5–8.1)

## 2019-12-04 LAB — TSH: TSH: 4.802 u[IU]/mL — ABNORMAL HIGH (ref 0.350–4.500)

## 2019-12-04 MED ORDER — SODIUM CHLORIDE 0.9 % IV SOLN
Freq: Once | INTRAVENOUS | Status: AC
  Administered 2019-12-04: 500 mL via INTRAVENOUS
  Filled 2019-12-04: qty 250

## 2019-12-04 MED ORDER — SODIUM CHLORIDE 0.9 % IV SOLN
240.0000 mg | Freq: Once | INTRAVENOUS | Status: AC
  Administered 2019-12-04: 240 mg via INTRAVENOUS
  Filled 2019-12-04: qty 24

## 2019-12-04 MED ORDER — HEPARIN SOD (PORK) LOCK FLUSH 100 UNIT/ML IV SOLN
INTRAVENOUS | Status: AC
  Filled 2019-12-04: qty 5

## 2019-12-04 MED ORDER — SODIUM CHLORIDE 0.9 % IV SOLN
Freq: Once | INTRAVENOUS | Status: AC
  Filled 2019-12-04: qty 250

## 2019-12-04 MED ORDER — HEPARIN SOD (PORK) LOCK FLUSH 100 UNIT/ML IV SOLN
500.0000 [IU] | Freq: Once | INTRAVENOUS | Status: AC | PRN
  Administered 2019-12-04: 500 [IU]
  Filled 2019-12-04: qty 5

## 2019-12-04 MED ORDER — SODIUM CHLORIDE 0.9% FLUSH
10.0000 mL | INTRAVENOUS | Status: DC | PRN
  Administered 2019-12-04: 10 mL via INTRAVENOUS
  Filled 2019-12-04: qty 10

## 2019-12-04 NOTE — Progress Notes (Signed)
Patient here for follow up. Pt states that he has a stitch left where his port was placed and when his clothes rub against it, it irritates site. Advised pt to contact Dr. Bunnie Domino office and let him know his concern.

## 2019-12-04 NOTE — Progress Notes (Signed)
Hematology/Oncology follow-up [facility] [phone] [phone]   Patient Care Team: Albina Billet, MD as PCP - General (Internal Medicine) Earlie Server, MD as Consulting Physician (Hematology and Oncology)  REFERRING PROVIDER: Albina Billet, MD  CHIEF COMPLAINTS/REASON FOR VISIT:  Follow-up for kidney cancer treatments  HISTORY OF PRESENTING ILLNESS:   Henry Moore is a  59 y.o.  male with PMH listed below was seen in consultation at the request of  Albina Billet, MD  for evaluation of kidney cancer Extensive medical records were reviewed Patient had MRI lumbar spine without contrast done on 07/13/2018 which showed mired lumbar degenerative disease. CT thoracic spine was done on 08/05/2018 which showed 5 cm left renal mass. 09/04/2018 CT abdomen and pelvis with and without contrast showed 5 cm round enhancing solid mass in the left kidney.  No involvement of left renal vein.  No lymphadenopathy Morphologic changes consistent with cirrhosis and the portal hypertension.  Small volume ascites and splenomegaly. . 10/16/2018 Left nephrectomy showed renal cell carcinoma, clear-cell type, nuclear grade 4, tumor extends into the renal vein and renal sinus fat.  Urethral, vascular and or resection margins are negative for tumor pT3a Nx Mx  01/28/2019 patient had another CT chest abdomen pelvis done with contrast Showed interval left nephrectomy.  Scattered tiny pulmonary nodules bilaterally.  Nonspecific.  No other evidence of metastatic disease.  Cirrhosis changes extensive coronary and aortic atherosclerosis  07/14/2019 patient underwent surveillance CT chest abdomen pelvis without contrast Interval progression of pulmonary metastasis with new enlarged right paratracheal lymph node 1.7 cm, previously 0.6 cm one-point since worrisome for metastatic adenopathy.  Multiple progressive pulmonary nodules, index nodule within the lingula measures 1.3 cm,  previously 3 mm. Index subpleural nodule within the anterior right upper lobe measuring 1.8 cm, previously 3 cm Index nodule within the post lateral right lower lobe measuring 5 mm, new from previous care Aortic sclerosis.  Three-vessel coronary artery calcification noted.  Cirrhosis changes.  Splenomegaly.  Patient was referred to establish care with medical oncology for further discussion and evaluation of metastatic renal cell carcinoma. Patient reports feeling well at baseline today.  Denies any shortness of breath, cough, hemoptysis, back pain, headache. He quitted smoking 2015.  Not currently actively drinking alcohol as well. Patient has chronic back pain unchanged.  # Liver cirrhosis with small amount of ascites/splenomegaly patient sees gastroenterology Dr. [name].  . He will get ultrasound surveillance due in February 2021 for screening of [place].  EGD surveillance for Barrett's plan in April 2021.  # Diabetes, on Humalog sliding scale and metformin.  #Family history of cancer and personal history of RCC.  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   # 10/29/2019 CT chest abdomen pelvis without contrast images were independently reviewed by me and discussed with patient.   CT showed marked improvement with resolution of many of the pulmonary nodules, prominent reduced size of other nodules.  Considerably reduced size of the right lower paratracheal lymph node now 0.9 cm in diameter. Hepatic cirrhosis with prominent splenomegaly and trace perisplenic ascites. Chronic CT findings includes aortic atherosclerotic vascular disease.  Mild sigmoid colon diverticulosis  INTERVAL HISTORY Henry Moore is a 59 y.o. male who has above history reviewed by me today presents for follow up visit for management of stage IV RCC  Problems and complaints are listed below: Patient has no new complaints. Tolerated immunotherapy. Review of Systems  Constitutional: Positive for fatigue.  Negative for appetite change,  chills, fever and unexpected weight change.  HENT:   Negative for hearing loss and voice change.   Eyes: Negative for eye problems and icterus.  Respiratory: Negative for chest tightness, cough and shortness of breath.   Cardiovascular: Negative for chest pain and leg swelling.  Gastrointestinal: Negative for abdominal distention and abdominal pain.  Endocrine: Negative for hot flashes.  Genitourinary: Negative for difficulty urinating, dysuria and frequency.   Musculoskeletal: Negative for arthralgias.       Chronic back pain  Skin: Negative for itching and rash.  Neurological: Negative for light-headedness and numbness.  Hematological: Negative for adenopathy. Does not bruise/bleed easily.  Psychiatric/Behavioral: Negative for confusion.    MEDICAL HISTORY:  Past Medical History:  Diagnosis Date  . Cough    SINUS DRAINAGE CAUSING COUGHING , REPORTS CLEAR MUCOUS   . Diabetes mellitus without complication ([place])   . Family history of breast cancer   . Family history of colon cancer   . Family history of stomach cancer   . HTN (hypertension)   . Hyperlipemia   . Left renal mass   . Platelets decreased ([place])    DENIES UNSUAL BLEEDING   . PONV (postoperative nausea and vomiting)   . Renal cell carcinoma ([place]) 08/04/2019  . WBC decreased     SURGICAL HISTORY: Past Surgical History:  Procedure Laterality Date  . ANKLE SURGERY Left   . ANTERIOR CERVICAL DECOMP/DISCECTOMY FUSION N/A 04/16/2014   Procedure: ANTERIOR CERVICAL DECOMPRESSION/DISCECTOMY FUSION  (ACDF C5-C7)   (2 LEVELS)     ;  Surgeon: Melina Schools, MD;  Location: [place];  Service: Orthopedics;  Laterality: N/A;  . APPENDECTOMY    . BACK SURGERY    . ESOPHAGOGASTRODUODENOSCOPY (EGD) WITH PROPOFOL N/A 02/06/2019   Procedure: ESOPHAGOGASTRODUODENOSCOPY (EGD) WITH PROPOFOL;  Surgeon: [name], MD;  Location: [facility] ENDOSCOPY;  Service: Gastroenterology;  Laterality: N/A;  .  ESOPHAGOGASTRODUODENOSCOPY (EGD) WITH PROPOFOL N/A 03/04/2019   Procedure: ESOPHAGOGASTRODUODENOSCOPY (EGD) WITH PROPOFOL;  Surgeon: Lin Landsman, MD;  Location[facility] ENDOSCOPY;  Service: Gastroenterology;  Laterality: N/A;  . ESOPHAGOGASTRODUODENOSCOPY (EGD) WITH PROPOFOL N/A 04/22/2019   Procedure: ESOPHAGOGASTRODUODENOSCOPY (EGD) WITH PROPOFOL;  Surgeon: [name], MD;  Location: [facility] ENDOSCOPY;  Service: Gastroenterology;  Laterality: N/A;  . FRACTURE SURGERY    . HYDROCELE EXCISION    . KNEE ARTHROSCOPY Bilateral   . LAPAROSCOPIC NEPHRECTOMY, HAND ASSISTED Left 10/16/2018   Procedure: LEFT HAND ASSISTED LAPAROSCOPIC NEPHRECTOMY;  Surgeon: Lucas Mallow, MD;  Location: [facility];  Service: Urology;  Laterality: Left;  . NASAL SINUS SURGERY     X 2  . 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 PORTA CATH INSERTION N/A 11/11/2019   Procedure: PORTA CATH INSERTION;  Surgeon: Katha Cabal, MD;  Location: [place] CV LAB;  Service: Cardiovascular;  Laterality: N/A;  . SHOULDER SURGERY Left     SOCIAL HISTORY: Social History   Socioeconomic History  . Marital status: Married    Spouse name: Not on file  . Number of children: Not on file  . Years of education: Not on file  . Highest education level: Not on file  Occupational History  . Not on file  Tobacco Use  . Smoking status: Former Smoker    Packs/day: 0.25    Years: 20.00    Pack years: 5.00    Quit date: 2015    Years since quitting: 6.3  . Smokeless tobacco: Never Used  Substance and Sexual Activity  . Alcohol use:  Not Currently    Alcohol/week: 0.0 standard drinks    Comment: no alchol in 1 year  . Drug use: No  . Sexual activity: Not on file  Other Topics Concern  . Not on file  Social History Narrative  . Not on file   Social Determinants of Health   Financial Resource Strain:   . Difficulty of Paying Living Expenses:   Food Insecurity:   . Worried About Charity fundraiser in the Last Year:    . Arboriculturist in the Last Year:   Transportation Needs:   . Film/video editor (Medical):   Marland Kitchen Lack of Transportation (Non-Medical):   Physical Activity:   . Days of Exercise per Week:   . Minutes of Exercise per Session:   Stress:   . Feeling of Stress :   Social Connections:   . Frequency of Communication with Friends and Family:   . Frequency of Social Gatherings with Friends and Family:   . Attends Religious Services:   . Active Member of Clubs or Organizations:   . Attends Archivist Meetings:   Marland Kitchen Marital Status:   Intimate Partner Violence:   . Fear of Current or Ex-Partner:   . Emotionally Abused:   Marland Kitchen Physically Abused:   . Sexually Abused:     FAMILY HISTORY: Family History  Problem Relation Age of Onset  . Diabetes Mother   . Cancer Mother        neuroendocrine cancer  . Cancer Father        neuroendocrine cancer of meninges  . Cancer Maternal Grandmother        tomach dx 46  . Alzheimer's disease Paternal Grandmother   . Lung cancer Paternal Grandfather   . Lung cancer Maternal Aunt   . Colon cancer Maternal Uncle   . Breast cancer Paternal Aunt        both dx 110s  . Ovarian cancer Other        dx 22  . Breast cancer Cousin     ALLERGIES:  is allergic to codeine and mucinex [guaifenesin er].  MEDICATIONS:  Current Outpatient Medications  Medication Sig Dispense Refill  . ACCU-CHEK AVIVA PLUS test strip CHECK BL00D SUGAR ONCE OR TWICE DAILY. (Patient taking differently: 1 each by Other route as needed. ) 100 each PRN  . amLODipine (NORVASC) 10 MG tablet Take 10 mg by mouth daily.    . carvedilol (COREG) 25 MG tablet Take 1 tablet (25 mg total) by mouth 2 (two) times daily with a meal. 180 tablet 3  . Cholecalciferol (VITAMIN D3) 125 MCG (5000 UT) CAPS Take 5,000 Units by mouth daily.    Marland Kitchen glipiZIDE (GLUCOTROL) 5 MG tablet Take 1 tablet (5 mg total) by mouth daily before breakfast. 90 tablet 3  . hydrochlorothiazide (HYDRODIURIL) 25 MG  tablet TAKE 1 TABLET DAILY. (Patient taking differently: Take 25 mg by mouth daily. ) 90 tablet 0  . insulin lispro (INSULIN LISPRO) 100 UNIT/ML KwikPen Junior Inject into the skin 3 (three) times daily as needed (Above 250). Sliding scale    . losartan (COZAAR) 100 MG tablet Take 1 tablet by mouth daily. (Patient taking differently: Take 100 mg by mouth daily. ) 90 tablet 0  . lovastatin (MEVACOR) 40 MG tablet Take 1 tablet (40 mg total) by mouth at bedtime. 60 tablet 0  . metFORMIN (GLUCOPHAGE-XR) 500 MG 24 hr tablet Take 500 mg by mouth 2 (two) times daily.     Marland Kitchen  prochlorperazine (COMPAZINE) 10 MG tablet Take 1 tablet (10 mg total) by mouth every 6 (six) hours as needed for nausea or vomiting. 30 tablet 0   No current facility-administered medications for this visit.     PHYSICAL EXAMINATION: ECOG PERFORMANCE STATUS: 1 - Symptomatic but completely ambulatory There were no vitals filed for this visit. There were no vitals filed for this visit.  Physical Exam Constitutional:      General: He is not in acute distress.    Appearance: He is obese.  HENT:     Head: Normocephalic and atraumatic.  Eyes:     General: No scleral icterus. Cardiovascular:     Rate and Rhythm: Normal rate and regular rhythm.     Heart sounds: Normal heart sounds.  Pulmonary:     Effort: Pulmonary effort is normal. No respiratory distress.     Breath sounds: No wheezing.  Abdominal:     General: Bowel sounds are normal. There is no distension.     Palpations: Abdomen is soft.  Musculoskeletal:        General: No deformity. Normal range of motion.     Cervical back: Normal range of motion and neck supple.  Skin:    General: Skin is warm and dry.     Findings: No erythema or rash.  Neurological:     Mental Status: He is alert and oriented to person, place, and time. Mental status is at baseline.     Cranial Nerves: No cranial nerve deficit.     Coordination: Coordination normal.  Psychiatric:         Mood and Affect: Mood normal.     LABORATORY DATA:  I have reviewed the data as listed Lab Results  Component Value Date   WBC 2.5 (L) 11/20/2019   HGB 10.6 (L) 11/20/2019   HCT 30.1 (L) 11/20/2019   MCV 90.1 11/20/2019   PLT 69 (L) 11/20/2019   Recent Labs    10/16/19 0813 11/06/19 0805 11/20/19 0853  NA 133* 136 135  K 4.4 4.4 4.5  CL 104 104 105  CO2 20* 21* 20*  GLUCOSE 127* 123* 128*  BUN 33* 34* 34*  CREATININE 1.85* 1.73* 1.82*  CALCIUM 8.6* 9.3 9.3  GFRNONAA 40* 43* 41*  GFRAA 46* 50* 47*  PROT 7.9 8.2* 8.3*  ALBUMIN 4.1 4.2 4.2  AST 30 30 32  ALT 25 23 23   ALKPHOS 105 112 116  BILITOT 1.2 0.9 1.0   Iron/TIBC/Ferritin/ %Sat    Component Value Date/Time   IRON 61 08/21/2019 0843   IRON 105 10/15/2018 1325   TIBC 284 08/21/2019 0843   TIBC 244 (L) 10/15/2018 1325   FERRITIN 237 08/21/2019 0843   FERRITIN 588 (H) 10/15/2018 1325   IRONPCTSAT 22 08/21/2019 0843   IRONPCTSAT 43 10/15/2018 1325      RADIOGRAPHIC STUDIES: I have personally reviewed the radiological images as listed and agreed with the findings in the report. CT ABDOMEN PELVIS WO CONTRAST  Result Date: 10/29/2019 CLINICAL DATA:  Metastatic left renal cell carcinoma, prior nephrectomy. EXAM: CT CHEST, ABDOMEN AND PELVIS WITHOUT CONTRAST TECHNIQUE: Multidetector CT imaging of the chest, abdomen and pelvis was performed following the standard protocol without IV contrast. COMPARISON:  Multiple exams, including 07/14/2019 FINDINGS: CT CHEST FINDINGS Cardiovascular: Coronary, aortic arch, and branch vessel atherosclerotic vascular disease. Mediastinum/Nodes: Right lower paratracheal node 0.9 cm in short axis on image 21/504, previously 1.8 cm by my measurements. Small stable left internal mammary lymph nodes including a  0.6 cm lymph node on image 21/504. Lungs/Pleura: Marked improvement, with resolution of many of the pulmonary nodules, and prominently reduced size of other nodules. For example, a  previously dominant 1.4 by 1.2 cm lingular nodule has completely resolved. A subpleural nodule in the right upper lobe on image 38/505 currently measures 0.4 cm in thickness on image 38/505, previously 0.8 cm in thickness. No new nodules identified. Musculoskeletal: Lower cervical plate and screw fixator. Lower thoracic spondylosis. CT ABDOMEN PELVIS FINDINGS Hepatobiliary: Nodular contour and morphology compatible with cirrhosis common no contour change from prior. Suspected gallbladder wall thickening. Pancreas: Unremarkable Spleen: Splenomegaly. The spleen measures 15.4 by 9.8 by 20.0 cm (volume = 1600 cm^3). Adrenals/Urinary Tract: Both adrenal glands appear normal. Unremarkable contour of the right kidney without findings of urinary tract calculi. Left nephrectomy. Stomach/Bowel: Mild sigmoid colon diverticulosis. Vascular/Lymphatic: Aortoiliac atherosclerotic vascular disease. Scattered small right gastric and peripancreatic lymph nodes not appreciably changed from prior. No overt pathologic adenopathy observed. Reproductive: Penile implant. Dystrophic calcifications along the prostate gland. Other: Stable mild mesenteric edema. Trace perisplenic ascites. Musculoskeletal: Unremarkable IMPRESSION: 1. Marked improvement, with resolution of many of the pulmonary nodules, and prominently reduced size of other nodules. Considerably reduced size of the right lower paratracheal lymph node, now 0.9 cm in diameter. 2. Hepatic cirrhosis with prominent splenomegaly and trace perisplenic ascites. 3. Other imaging findings of potential clinical significance: Coronary, aortic arch, and branch vessel atherosclerotic vascular disease. Mild sigmoid colon diverticulosis. Aortic Atherosclerosis (ICD10-I70.0). Electronically Signed   By: Van Clines M.D.   On: 10/29/2019 13:27   CT CHEST WO CONTRAST  Result Date: 10/29/2019 CLINICAL DATA:  Metastatic left renal cell carcinoma, prior nephrectomy. EXAM: CT CHEST, ABDOMEN  AND PELVIS WITHOUT CONTRAST TECHNIQUE: Multidetector CT imaging of the chest, abdomen and pelvis was performed following the standard protocol without IV contrast. COMPARISON:  Multiple exams, including 07/14/2019 FINDINGS: CT CHEST FINDINGS Cardiovascular: Coronary, aortic arch, and branch vessel atherosclerotic vascular disease. Mediastinum/Nodes: Right lower paratracheal node 0.9 cm in short axis on image 21/504, previously 1.8 cm by my measurements. Small stable left internal mammary lymph nodes including a 0.6 cm lymph node on image 21/504. Lungs/Pleura: Marked improvement, with resolution of many of the pulmonary nodules, and prominently reduced size of other nodules. For example, a previously dominant 1.4 by 1.2 cm lingular nodule has completely resolved. A subpleural nodule in the right upper lobe on image 38/505 currently measures 0.4 cm in thickness on image 38/505, previously 0.8 cm in thickness. No new nodules identified. Musculoskeletal: Lower cervical plate and screw fixator. Lower thoracic spondylosis. CT ABDOMEN PELVIS FINDINGS Hepatobiliary: Nodular contour and morphology compatible with cirrhosis common no contour change from prior. Suspected gallbladder wall thickening. Pancreas: Unremarkable Spleen: Splenomegaly. The spleen measures 15.4 by 9.8 by 20.0 cm (volume = 1600 cm^3). Adrenals/Urinary Tract: Both adrenal glands appear normal. Unremarkable contour of the right kidney without findings of urinary tract calculi. Left nephrectomy. Stomach/Bowel: Mild sigmoid colon diverticulosis. Vascular/Lymphatic: Aortoiliac atherosclerotic vascular disease. Scattered small right gastric and peripancreatic lymph nodes not appreciably changed from prior. No overt pathologic adenopathy observed. Reproductive: Penile implant. Dystrophic calcifications along the prostate gland. Other: Stable mild mesenteric edema. Trace perisplenic ascites. Musculoskeletal: Unremarkable IMPRESSION: 1. Marked improvement, with  resolution of many of the pulmonary nodules, and prominently reduced size of other nodules. Considerably reduced size of the right lower paratracheal lymph node, now 0.9 cm in diameter. 2. Hepatic cirrhosis with prominent splenomegaly and trace perisplenic ascites. 3. Other imaging findings of potential  clinical significance: Coronary, aortic arch, and branch vessel atherosclerotic vascular disease. Mild sigmoid colon diverticulosis. Aortic Atherosclerosis (ICD10-I70.0). Electronically Signed   By: Van Clines M.D.   On: 10/29/2019 13:27   PERIPHERAL VASCULAR CATHETERIZATION  Result Date: 11/11/2019 See op note    ASSESSMENT & PLAN:  1. Renal cell carcinoma, unspecified laterality ([place])   2. Encounter for antineoplastic immunotherapy   3. Other cirrhosis of liver ([place])   4. Thrombocytopenia ([place])   5. 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  Status post ipilimumab and nivolumab treatment. Currently on maintenance nivolumab treatments Labs reviewed and discussed with patient. Proceed with immunotherapy nivolumab today.  #Chronic kidney disease, creatinine slightly increased to 2 from his baseline.  I encourage patient to make a follow-up appointment with Dr. Candiss Norse nephrologist. Immunotherapy may contribute to increase of creatinine level.  Continue close monitor.  #Chronic liver cirrhosis with thrombocytopenia and neutropenia.   Labs reviewed.  Platelet counts are stable at 61,000.  Chronic neutropenia with ANC.  1.2. #Anemia secondary to chronic kidney disease, hemoglobin has decreased to 9.9. I will check iron panel at the next visit.  Erythropoietin replacement therapy is contraindicated #Slightly elevated TSH.  Will add free T4. Marland Kitchen #Port-A-Cath in place, Follow-up  2 weeks lab MD for assessment evaluation prior to the next cycle of nivolumab. All questions were answered. The patient knows to call the clinic with any problems questions or concerns.   Earlie Server, MD, PhD Hematology Oncology [facility] at [facility] Pager- [phone] 12/04/2019

## 2019-12-05 ENCOUNTER — Telehealth: Payer: Self-pay

## 2019-12-05 ENCOUNTER — Other Ambulatory Visit: Payer: Self-pay

## 2019-12-05 DIAGNOSIS — R7989 Other specified abnormal findings of blood chemistry: Secondary | ICD-10-CM

## 2019-12-05 DIAGNOSIS — C649 Malignant neoplasm of unspecified kidney, except renal pelvis: Secondary | ICD-10-CM

## 2019-12-05 LAB — T4, FREE: Free T4: 1.1 ng/dL (ref 0.61–1.12)

## 2019-12-05 NOTE — Telephone Encounter (Signed)
-----   Message from Earlie Server, MD sent at 12/04/2019  5:29 PM EDT ----- Please add free T4 to is labs. Thank you

## 2019-12-05 NOTE — Telephone Encounter (Signed)
Contacted the lab and they had added the requested lab.

## 2019-12-11 NOTE — Progress Notes (Signed)
Pharmacist Chemotherapy Monitoring - Follow Up Assessment    I verify that I have reviewed each item in the below checklist:  . Regimen for the patient is scheduled for the appropriate day and plan matches scheduled date. Marland Kitchen Appropriate non-routine labs are ordered dependent on drug ordered. . If applicable, additional medications reviewed and ordered per protocol based on lifetime cumulative doses and/or treatment regimen.   Plan for follow-up and/or issues identified: No . I-vent associated with next due treatment: No . MD and/or nursing notified: No  Thaila Bottoms K 12/11/2019 9:19 AM

## 2019-12-15 ENCOUNTER — Inpatient Hospital Stay (HOSPITAL_BASED_OUTPATIENT_CLINIC_OR_DEPARTMENT_OTHER): Payer: BC Managed Care – PPO | Admitting: Hospice and Palliative Medicine

## 2019-12-15 DIAGNOSIS — C649 Malignant neoplasm of unspecified kidney, except renal pelvis: Secondary | ICD-10-CM | POA: Diagnosis not present

## 2019-12-15 DIAGNOSIS — Z515 Encounter for palliative care: Secondary | ICD-10-CM

## 2019-12-15 NOTE — Progress Notes (Signed)
Virtual Visit via Telephone Note  I connected with Henry Moore on 12/15/19 at  1:30 PM EDT by telephone and verified that I am speaking with the correct person using two identifiers.   I discussed the limitations, risks, security and privacy concerns of performing an evaluation and management service by telephone and the availability of in person appointments. I also discussed with the patient that there may be a patient responsible charge related to this service. The patient expressed understanding and agreed to proceed.   History of Present Illness: Henry Moore is a 59 y.o. 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    CT 10/29/2019 revealed marked improvement in tumor burden.  Patient was referred to palliative care to help address goals and manage ongoing symptoms.   Observations/Objective: I called and spoke with patient by phone.  Patient says that he is doing well.  He denies any significant changes or concerns.  No symptomatic complaints today.  No issues with medications nor need for refills.  Patient says his performance status is stable.  He reports good appetite.  No weight loss or other notable changes.  He says he sleeping well.  Assessment and Plan: RCC -on treatment with nivolumab.  Followed by Dr. Tasia Catchings.  Seems to be tolerating treatments well.  We will follow  Follow Up Instructions: Virtual visit in 2 to 3 months   I discussed the assessment and treatment plan with the patient. The patient was provided an opportunity to ask questions and all were answered. The patient agreed with the plan and demonstrated an understanding of the instructions.   The patient was advised to call back or seek an in-person evaluation if the symptoms worsen or if the condition fails to improve as anticipated.  I provided 5 minutes of non-face-to-face time during this  encounter.   Irean Hong, NP

## 2019-12-18 ENCOUNTER — Other Ambulatory Visit: Payer: Self-pay

## 2019-12-18 ENCOUNTER — Encounter: Payer: Self-pay | Admitting: Oncology

## 2019-12-18 ENCOUNTER — Inpatient Hospital Stay: Payer: BC Managed Care – PPO

## 2019-12-18 ENCOUNTER — Inpatient Hospital Stay (HOSPITAL_BASED_OUTPATIENT_CLINIC_OR_DEPARTMENT_OTHER): Payer: BC Managed Care – PPO | Admitting: Oncology

## 2019-12-18 VITALS — BP 109/70 | HR 73 | Temp 96.8°F | Resp 18 | Wt 250.9 lb

## 2019-12-18 DIAGNOSIS — C649 Malignant neoplasm of unspecified kidney, except renal pelvis: Secondary | ICD-10-CM | POA: Diagnosis not present

## 2019-12-18 DIAGNOSIS — R7989 Other specified abnormal findings of blood chemistry: Secondary | ICD-10-CM

## 2019-12-18 DIAGNOSIS — D696 Thrombocytopenia, unspecified: Secondary | ICD-10-CM

## 2019-12-18 DIAGNOSIS — Z5112 Encounter for antineoplastic immunotherapy: Secondary | ICD-10-CM

## 2019-12-18 DIAGNOSIS — N1832 Chronic kidney disease, stage 3b: Secondary | ICD-10-CM

## 2019-12-18 DIAGNOSIS — K7469 Other cirrhosis of liver: Secondary | ICD-10-CM

## 2019-12-18 DIAGNOSIS — Z95828 Presence of other vascular implants and grafts: Secondary | ICD-10-CM

## 2019-12-18 DIAGNOSIS — C642 Malignant neoplasm of left kidney, except renal pelvis: Secondary | ICD-10-CM | POA: Diagnosis not present

## 2019-12-18 LAB — COMPREHENSIVE METABOLIC PANEL
ALT: 28 U/L (ref 0–44)
AST: 34 U/L (ref 15–41)
Albumin: 4.1 g/dL (ref 3.5–5.0)
Alkaline Phosphatase: 120 U/L (ref 38–126)
Anion gap: 9 (ref 5–15)
BUN: 36 mg/dL — ABNORMAL HIGH (ref 6–20)
CO2: 21 mmol/L — ABNORMAL LOW (ref 22–32)
Calcium: 8.8 mg/dL — ABNORMAL LOW (ref 8.9–10.3)
Chloride: 104 mmol/L (ref 98–111)
Creatinine, Ser: 1.97 mg/dL — ABNORMAL HIGH (ref 0.61–1.24)
GFR calc Af Amer: 43 mL/min — ABNORMAL LOW (ref >60)
GFR calc non Af Amer: 37 mL/min — ABNORMAL LOW (ref >60)
Glucose, Bld: 120 mg/dL — ABNORMAL HIGH (ref 70–99)
Potassium: 4.3 mmol/L (ref 3.5–5.1)
Sodium: 134 mmol/L — ABNORMAL LOW (ref 135–145)
Total Bilirubin: 0.8 mg/dL (ref 0.3–1.2)
Total Protein: 7.8 g/dL (ref 6.5–8.1)

## 2019-12-18 LAB — CBC WITH DIFFERENTIAL/PLATELET
Abs Immature Granulocytes: 0.01 10*3/uL (ref 0.00–0.07)
Basophils Absolute: 0 10*3/uL (ref 0.0–0.1)
Basophils Relative: 1 %
Eosinophils Absolute: 0.1 10*3/uL (ref 0.0–0.5)
Eosinophils Relative: 5 %
HCT: 28.9 % — ABNORMAL LOW (ref 39.0–52.0)
Hemoglobin: 9.9 g/dL — ABNORMAL LOW (ref 13.0–17.0)
Immature Granulocytes: 1 %
Lymphocytes Relative: 23 %
Lymphs Abs: 0.5 10*3/uL — ABNORMAL LOW (ref 0.7–4.0)
MCH: 31.4 pg (ref 26.0–34.0)
MCHC: 34.3 g/dL (ref 30.0–36.0)
MCV: 91.7 fL (ref 80.0–100.0)
Monocytes Absolute: 0.3 10*3/uL (ref 0.1–1.0)
Monocytes Relative: 12 %
Neutro Abs: 1.2 10*3/uL — ABNORMAL LOW (ref 1.7–7.7)
Neutrophils Relative %: 58 %
Platelets: 60 10*3/uL — ABNORMAL LOW (ref 150–400)
RBC: 3.15 MIL/uL — ABNORMAL LOW (ref 4.22–5.81)
RDW: 13.1 % (ref 11.5–15.5)
WBC: 2.1 10*3/uL — ABNORMAL LOW (ref 4.0–10.5)
nRBC: 0 % (ref 0.0–0.2)

## 2019-12-18 LAB — IRON AND TIBC
Iron: 52 ug/dL (ref 45–182)
Saturation Ratios: 18 % (ref 17.9–39.5)
TIBC: 284 ug/dL (ref 250–450)
UIBC: 232 ug/dL

## 2019-12-18 LAB — RETIC PANEL
Immature Retic Fract: 9.9 % (ref 2.3–15.9)
RBC.: 3.14 MIL/uL — ABNORMAL LOW (ref 4.22–5.81)
Retic Count, Absolute: 54 10*3/uL (ref 19.0–186.0)
Retic Ct Pct: 1.7 % (ref 0.4–3.1)
Reticulocyte Hemoglobin: 34.2 pg (ref >27.9)

## 2019-12-18 LAB — FERRITIN: Ferritin: 146 ng/mL (ref 24–336)

## 2019-12-18 MED ORDER — SODIUM CHLORIDE 0.9% FLUSH
10.0000 mL | INTRAVENOUS | Status: DC | PRN
  Administered 2019-12-18: 10 mL via INTRAVENOUS
  Filled 2019-12-18: qty 10

## 2019-12-18 MED ORDER — SODIUM CHLORIDE 0.9 % IV SOLN
240.0000 mg | Freq: Once | INTRAVENOUS | Status: AC
  Administered 2019-12-18: 240 mg via INTRAVENOUS
  Filled 2019-12-18: qty 24

## 2019-12-18 MED ORDER — HEPARIN SOD (PORK) LOCK FLUSH 100 UNIT/ML IV SOLN
INTRAVENOUS | Status: AC
  Filled 2019-12-18: qty 5

## 2019-12-18 MED ORDER — SODIUM CHLORIDE 0.9 % IV SOLN
Freq: Once | INTRAVENOUS | Status: AC
  Filled 2019-12-18: qty 250

## 2019-12-18 MED ORDER — HEPARIN SOD (PORK) LOCK FLUSH 100 UNIT/ML IV SOLN
500.0000 [IU] | Freq: Once | INTRAVENOUS | Status: AC | PRN
  Administered 2019-12-18: 500 [IU]
  Filled 2019-12-18: qty 5

## 2019-12-18 NOTE — Progress Notes (Signed)
Hematology/Oncology follow-up [facility] Telephone:([phone] [phone]   Patient Care Team: Albina Billet, MD as PCP - General (Internal Medicine) Earlie Server, MD as Consulting Physician (Hematology and Oncology)  REFERRING PROVIDER: Albina Billet, MD  CHIEF COMPLAINTS/REASON FOR VISIT:  Follow-up for kidney cancer treatments  HISTORY OF PRESENTING ILLNESS:   Henry Moore is a  59 y.o.  male with PMH listed below was seen in consultation at the request of  Albina Billet, MD  for evaluation of kidney cancer Extensive medical records were reviewed Patient had MRI lumbar spine without contrast done on 07/13/2018 which showed mired lumbar degenerative disease. CT thoracic spine was done on 08/05/2018 which showed 5 cm left renal mass. 09/04/2018 CT abdomen and pelvis with and without contrast showed 5 cm round enhancing solid mass in the left kidney.  No involvement of left renal vein.  No lymphadenopathy Morphologic changes consistent with cirrhosis and the portal hypertension.  Small volume ascites and splenomegaly. . 10/16/2018 Left nephrectomy showed renal cell carcinoma, clear-cell type, nuclear grade 4, tumor extends into the renal vein and renal sinus fat.  Urethral, vascular and or resection margins are negative for tumor pT3a Nx Mx  01/28/2019 patient had another CT chest abdomen pelvis done with contrast Showed interval left nephrectomy.  Scattered tiny pulmonary nodules bilaterally.  Nonspecific.  No other evidence of metastatic disease.  Cirrhosis changes extensive coronary and aortic atherosclerosis  07/14/2019 patient underwent surveillance CT chest abdomen pelvis without contrast Interval progression of pulmonary metastasis with new enlarged right paratracheal lymph node 1.7 cm, previously 0.6 cm one-point since worrisome for metastatic adenopathy.  Multiple progressive pulmonary nodules, index nodule within the lingula measures 1.3 cm,  previously 3 mm. Index subpleural nodule within the anterior right upper lobe measuring 1.8 cm, previously 3 cm Index nodule within the post lateral right lower lobe measuring 5 mm, new from previous care Aortic sclerosis.  Three-vessel coronary artery calcification noted.  Cirrhosis changes.  Splenomegaly.  Patient was referred to establish care with medical oncology for further discussion and evaluation of metastatic renal cell carcinoma. Patient reports feeling well at baseline today.  Denies any shortness of breath, cough, hemoptysis, back pain, headache. He quitted smoking 2015.  Not currently actively drinking alcohol as well. Patient has chronic back pain unchanged.  # Liver cirrhosis with small amount of ascites/splenomegaly patient sees gastroenterology Dr. [name].  . He will get ultrasound surveillance due in February 2021 for screening of [place].  EGD surveillance for Barrett's plan in April 2021.  # Diabetes, on Humalog sliding scale and metformin.  #Family history of cancer and personal history of RCC.  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   # 10/29/2019 CT chest abdomen pelvis without contrast images were independently reviewed by me and discussed with patient.   CT showed marked improvement with resolution of many of the pulmonary nodules, prominent reduced size of other nodules.  Considerably reduced size of the right lower paratracheal lymph node now 0.9 cm in diameter. Hepatic cirrhosis with prominent splenomegaly and trace perisplenic ascites. Chronic CT findings includes aortic atherosclerotic vascular disease.  Mild sigmoid colon diverticulosis  INTERVAL HISTORY Henry Moore is a 59 y.o. male who has above history reviewed by me today presents for follow up visit for management of stage IV RCC  Problems and complaints are listed below: Patient has no new complaints. He tolerates immunotherapy well.  Denies any skin rash, diarrhea, fever or chills.  No breathing difficulties Chronic fatigue is at baseline  Review of Systems  Constitutional: Positive for fatigue. Negative for appetite change, chills, fever and unexpected weight change.  HENT:   Negative for hearing loss and voice change.   Eyes: Negative for eye problems and icterus.  Respiratory: Negative for chest tightness, cough and shortness of breath.   Cardiovascular: Negative for chest pain and leg swelling.  Gastrointestinal: Negative for abdominal distention and abdominal pain.  Endocrine: Negative for hot flashes.  Genitourinary: Negative for difficulty urinating, dysuria and frequency.   Musculoskeletal: Negative for arthralgias.       Chronic back pain  Skin: Negative for itching and rash.  Neurological: Negative for light-headedness and numbness.  Hematological: Negative for adenopathy. Does not bruise/bleed easily.  Psychiatric/Behavioral: Negative for confusion.    MEDICAL HISTORY:  Past Medical History:  Diagnosis Date  . Cough    SINUS DRAINAGE CAUSING COUGHING , REPORTS CLEAR MUCOUS   . Diabetes mellitus without complication ([place])   . Family history of breast cancer   . Family history of colon cancer   . Family history of stomach cancer   . HTN (hypertension)   . Hyperlipemia   . Left renal mass   . Platelets decreased ([place])    DENIES UNSUAL BLEEDING   . PONV (postoperative nausea and vomiting)   . Renal cell carcinoma ([place]) 08/04/2019  . WBC decreased     SURGICAL HISTORY: Past Surgical History:  Procedure Laterality Date  . ANKLE SURGERY Left   . ANTERIOR CERVICAL DECOMP/DISCECTOMY FUSION N/A 04/16/2014   Procedure: ANTERIOR CERVICAL DECOMPRESSION/DISCECTOMY FUSION  (ACDF C5-C7)   (2 LEVELS)     ;  Surgeon: Melina Schools, MD;  Location: [facility];  Service: Orthopedics;  Laterality: N/A;  . APPENDECTOMY    . BACK SURGERY    . ESOPHAGOGASTRODUODENOSCOPY (EGD) WITH PROPOFOL N/A 02/06/2019   Procedure: ESOPHAGOGASTRODUODENOSCOPY (EGD) WITH PROPOFOL;   Surgeon: [name], MD;  Location[facility] ENDOSCOPY;  Service: Gastroenterology;  Laterality: N/A;  . ESOPHAGOGASTRODUODENOSCOPY (EGD) WITH PROPOFOL N/A 03/04/2019   Procedure: ESOPHAGOGASTRODUODENOSCOPY (EGD) WITH PROPOFOL;  Surgeon: Lin Landsman, MD;  Location: [facility] ENDOSCOPY;  Service: Gastroenterology;  Laterality: N/A;  . ESOPHAGOGASTRODUODENOSCOPY (EGD) WITH PROPOFOL N/A 04/22/2019   Procedure: ESOPHAGOGASTRODUODENOSCOPY (EGD) WITH PROPOFOL;  Surgeon: [name], MD;  Location: [facility] ENDOSCOPY;  Service: Gastroenterology;  Laterality: N/A;  . FRACTURE SURGERY    . HYDROCELE EXCISION    . KNEE ARTHROSCOPY Bilateral   . LAPAROSCOPIC NEPHRECTOMY, HAND ASSISTED Left 10/16/2018   Procedure: LEFT HAND ASSISTED LAPAROSCOPIC NEPHRECTOMY;  Surgeon: Lucas Mallow, MD;  Location: [facility];  Service: Urology;  Laterality: Left;  . NASAL SINUS SURGERY     X 2  . 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 PORTA CATH INSERTION N/A 11/11/2019   Procedure: PORTA CATH INSERTION;  Surgeon: Katha Cabal, MD;  Location: [facility];  Service: Cardiovascular;  Laterality: N/A;  . SHOULDER SURGERY Left     SOCIAL HISTORY: Social History   Socioeconomic History  . Marital status: Married    Spouse name: Not on file  . Number of children: Not on file  . Years of education: Not on file  . Highest education level: Not on file  Occupational History  . Not on file  Tobacco Use  . Smoking status: Former Smoker    Packs/day: 0.25    Years: 20.00    Pack years: 5.00    Quit date: 2015  Years since quitting: 6.3  . Smokeless tobacco: Never Used  Substance and Sexual Activity  . Alcohol use: Not Currently    Alcohol/week: 0.0 standard drinks    Comment: no alchol in 1 year  . Drug use: No  . Sexual activity: Not on file  Other Topics Concern  . Not on file  Social History Narrative  . Not on file   Social Determinants of Health   Financial Resource Strain:   . Difficulty  of Paying Living Expenses:   Food Insecurity:   . Worried About Charity fundraiser in the Last Year:   . Arboriculturist in the Last Year:   Transportation Needs:   . Film/video editor (Medical):   Marland Kitchen Lack of Transportation (Non-Medical):   Physical Activity:   . Days of Exercise per Week:   . Minutes of Exercise per Session:   Stress:   . Feeling of Stress :   Social Connections:   . Frequency of Communication with Friends and Family:   . Frequency of Social Gatherings with Friends and Family:   . Attends Religious Services:   . Active Member of Clubs or Organizations:   . Attends Archivist Meetings:   Marland Kitchen Marital Status:   Intimate Partner Violence:   . Fear of Current or Ex-Partner:   . Emotionally Abused:   Marland Kitchen Physically Abused:   . Sexually Abused:     FAMILY HISTORY: Family History  Problem Relation Age of Onset  . Diabetes Mother   . Cancer Mother        neuroendocrine cancer  . Cancer Father        neuroendocrine cancer of meninges  . Cancer Maternal Grandmother        tomach dx 3  . Alzheimer's disease Paternal Grandmother   . Lung cancer Paternal Grandfather   . Lung cancer Maternal Aunt   . Colon cancer Maternal Uncle   . Breast cancer Paternal Aunt        both dx 103s  . Ovarian cancer Other        dx 33  . Breast cancer Cousin     ALLERGIES:  is allergic to codeine and mucinex [guaifenesin er].  MEDICATIONS:  Current Outpatient Medications  Medication Sig Dispense Refill  . amLODipine (NORVASC) 10 MG tablet Take 10 mg by mouth daily.    . carvedilol (COREG) 25 MG tablet Take 1 tablet (25 mg total) by mouth 2 (two) times daily with a meal. 180 tablet 3  . Cholecalciferol (VITAMIN D3) 125 MCG (5000 UT) CAPS Take 5,000 Units by mouth daily.    Marland Kitchen glipiZIDE (GLUCOTROL) 5 MG tablet Take 1 tablet (5 mg total) by mouth daily before breakfast. 90 tablet 3  . hydrochlorothiazide (HYDRODIURIL) 25 MG tablet TAKE 1 TABLET DAILY. (Patient taking  differently: Take 25 mg by mouth daily. ) 90 tablet 0  . insulin lispro (INSULIN LISPRO) 100 UNIT/ML KwikPen Junior Inject into the skin 3 (three) times daily as needed (Above 250). Sliding scale    . losartan (COZAAR) 100 MG tablet Take 1 tablet by mouth daily. (Patient taking differently: Take 100 mg by mouth daily. ) 90 tablet 0  . lovastatin (MEVACOR) 40 MG tablet Take 1 tablet (40 mg total) by mouth at bedtime. 60 tablet 0  . metFORMIN (GLUCOPHAGE-XR) 500 MG 24 hr tablet Take 500 mg by mouth 2 (two) times daily.     . prochlorperazine (COMPAZINE) 10 MG tablet Take 1  tablet (10 mg total) by mouth every 6 (six) hours as needed for nausea or vomiting. 30 tablet 0  . ACCU-CHEK AVIVA PLUS test strip CHECK BL00D SUGAR ONCE OR TWICE DAILY. (Patient not taking: No sig reported) 100 each PRN   No current facility-administered medications for this visit.   Facility-Administered Medications Ordered in Other Visits  Medication Dose Route Frequency Provider Last Rate Last Admin  . nivolumab (OPDIVO) 240 mg in sodium chloride 0.9 % 100 mL chemo infusion  240 mg Intravenous Once Earlie Server, MD 248 mL/hr at 12/18/19 1027 240 mg at 12/18/19 1027  . sodium chloride flush (NS) 0.9 % injection 10 mL  10 mL Intravenous PRN Earlie Server, MD   10 mL at 12/18/19 0838     PHYSICAL EXAMINATION: ECOG PERFORMANCE STATUS: 1 - Symptomatic but completely ambulatory Vitals:   12/18/19 0847  BP: 109/70  Pulse: 73  Resp: 18  Temp: (!) 96.8 F (36 C)  SpO2: 99%   Filed Weights   12/18/19 0847  Weight: 250 lb 14.4 oz (113.8 kg)    Physical Exam Constitutional:      General: He is not in acute distress.    Appearance: He is obese.  HENT:     Head: Normocephalic and atraumatic.  Eyes:     General: No scleral icterus. Cardiovascular:     Rate and Rhythm: Normal rate and regular rhythm.     Heart sounds: Normal heart sounds.  Pulmonary:     Effort: Pulmonary effort is normal. No respiratory distress.     Breath  sounds: No wheezing.  Abdominal:     General: Bowel sounds are normal. There is no distension.     Palpations: Abdomen is soft.  Musculoskeletal:        General: No deformity. Normal range of motion.     Cervical back: Normal range of motion and neck supple.  Skin:    General: Skin is warm and dry.     Findings: No erythema or rash.  Neurological:     Mental Status: He is alert and oriented to person, place, and time. Mental status is at baseline.     Cranial Nerves: No cranial nerve deficit.     Coordination: Coordination normal.  Psychiatric:        Mood and Affect: Mood normal.     LABORATORY DATA:  I have reviewed the data as listed Lab Results  Component Value Date   WBC 2.1 (L) 12/18/2019   HGB 9.9 (L) 12/18/2019   HCT 28.9 (L) 12/18/2019   MCV 91.7 12/18/2019   PLT 60 (L) 12/18/2019   Recent Labs    11/20/19 0853 12/04/19 0838 12/18/19 0826  NA 135 136 134*  K 4.5 4.3 4.3  CL 105 106 104  CO2 20* 21* 21*  GLUCOSE 128* 79 120*  BUN 34* 35* 36*  CREATININE 1.82* 2.04* 1.97*  CALCIUM 9.3 8.8* 8.8*  GFRNONAA 41* 35* 37*  GFRAA 47* 41* 43*  PROT 8.3* 8.0 7.8  ALBUMIN 4.2 4.1 4.1  AST 32 31 34  ALT 23 21 28   ALKPHOS 116 101 120  BILITOT 1.0 1.1 0.8   Iron/TIBC/Ferritin/ %Sat    Component Value Date/Time   IRON 52 12/18/2019 0826   IRON 105 10/15/2018 1325   TIBC 284 12/18/2019 0826   TIBC 244 (L) 10/15/2018 1325   FERRITIN 146 12/18/2019 0826   FERRITIN 588 (H) 10/15/2018 1325   IRONPCTSAT 18 12/18/2019 0826   IRONPCTSAT 43 10/15/2018  1325      RADIOGRAPHIC STUDIES: I have personally reviewed the radiological images as listed and agreed with the findings in the report. CT ABDOMEN PELVIS WO CONTRAST  Result Date: 10/29/2019 CLINICAL DATA:  Metastatic left renal cell carcinoma, prior nephrectomy. EXAM: CT CHEST, ABDOMEN AND PELVIS WITHOUT CONTRAST TECHNIQUE: Multidetector CT imaging of the chest, abdomen and pelvis was performed following the  standard protocol without IV contrast. COMPARISON:  Multiple exams, including 07/14/2019 FINDINGS: CT CHEST FINDINGS Cardiovascular: Coronary, aortic arch, and branch vessel atherosclerotic vascular disease. Mediastinum/Nodes: Right lower paratracheal node 0.9 cm in short axis on image 21/504, previously 1.8 cm by my measurements. Small stable left internal mammary lymph nodes including a 0.6 cm lymph node on image 21/504. Lungs/Pleura: Marked improvement, with resolution of many of the pulmonary nodules, and prominently reduced size of other nodules. For example, a previously dominant 1.4 by 1.2 cm lingular nodule has completely resolved. A subpleural nodule in the right upper lobe on image 38/505 currently measures 0.4 cm in thickness on image 38/505, previously 0.8 cm in thickness. No new nodules identified. Musculoskeletal: Lower cervical plate and screw fixator. Lower thoracic spondylosis. CT ABDOMEN PELVIS FINDINGS Hepatobiliary: Nodular contour and morphology compatible with cirrhosis common no contour change from prior. Suspected gallbladder wall thickening. Pancreas: Unremarkable Spleen: Splenomegaly. The spleen measures 15.4 by 9.8 by 20.0 cm (volume = 1600 cm^3). Adrenals/Urinary Tract: Both adrenal glands appear normal. Unremarkable contour of the right kidney without findings of urinary tract calculi. Left nephrectomy. Stomach/Bowel: Mild sigmoid colon diverticulosis. Vascular/Lymphatic: Aortoiliac atherosclerotic vascular disease. Scattered small right gastric and peripancreatic lymph nodes not appreciably changed from prior. No overt pathologic adenopathy observed. Reproductive: Penile implant. Dystrophic calcifications along the prostate gland. Other: Stable mild mesenteric edema. Trace perisplenic ascites. Musculoskeletal: Unremarkable IMPRESSION: 1. Marked improvement, with resolution of many of the pulmonary nodules, and prominently reduced size of other nodules. Considerably reduced size of the  right lower paratracheal lymph node, now 0.9 cm in diameter. 2. Hepatic cirrhosis with prominent splenomegaly and trace perisplenic ascites. 3. Other imaging findings of potential clinical significance: Coronary, aortic arch, and branch vessel atherosclerotic vascular disease. Mild sigmoid colon diverticulosis. Aortic Atherosclerosis (ICD10-I70.0). Electronically Signed   By: Van Clines M.D.   On: 10/29/2019 13:27   CT CHEST WO CONTRAST  Result Date: 10/29/2019 CLINICAL DATA:  Metastatic left renal cell carcinoma, prior nephrectomy. EXAM: CT CHEST, ABDOMEN AND PELVIS WITHOUT CONTRAST TECHNIQUE: Multidetector CT imaging of the chest, abdomen and pelvis was performed following the standard protocol without IV contrast. COMPARISON:  Multiple exams, including 07/14/2019 FINDINGS: CT CHEST FINDINGS Cardiovascular: Coronary, aortic arch, and branch vessel atherosclerotic vascular disease. Mediastinum/Nodes: Right lower paratracheal node 0.9 cm in short axis on image 21/504, previously 1.8 cm by my measurements. Small stable left internal mammary lymph nodes including a 0.6 cm lymph node on image 21/504. Lungs/Pleura: Marked improvement, with resolution of many of the pulmonary nodules, and prominently reduced size of other nodules. For example, a previously dominant 1.4 by 1.2 cm lingular nodule has completely resolved. A subpleural nodule in the right upper lobe on image 38/505 currently measures 0.4 cm in thickness on image 38/505, previously 0.8 cm in thickness. No new nodules identified. Musculoskeletal: Lower cervical plate and screw fixator. Lower thoracic spondylosis. CT ABDOMEN PELVIS FINDINGS Hepatobiliary: Nodular contour and morphology compatible with cirrhosis common no contour change from prior. Suspected gallbladder wall thickening. Pancreas: Unremarkable Spleen: Splenomegaly. The spleen measures 15.4 by 9.8 by 20.0 cm (volume = 1600 cm^3).  Adrenals/Urinary Tract: Both adrenal glands appear  normal. Unremarkable contour of the right kidney without findings of urinary tract calculi. Left nephrectomy. Stomach/Bowel: Mild sigmoid colon diverticulosis. Vascular/Lymphatic: Aortoiliac atherosclerotic vascular disease. Scattered small right gastric and peripancreatic lymph nodes not appreciably changed from prior. No overt pathologic adenopathy observed. Reproductive: Penile implant. Dystrophic calcifications along the prostate gland. Other: Stable mild mesenteric edema. Trace perisplenic ascites. Musculoskeletal: Unremarkable IMPRESSION: 1. Marked improvement, with resolution of many of the pulmonary nodules, and prominently reduced size of other nodules. Considerably reduced size of the right lower paratracheal lymph node, now 0.9 cm in diameter. 2. Hepatic cirrhosis with prominent splenomegaly and trace perisplenic ascites. 3. Other imaging findings of potential clinical significance: Coronary, aortic arch, and branch vessel atherosclerotic vascular disease. Mild sigmoid colon diverticulosis. Aortic Atherosclerosis (ICD10-I70.0). Electronically Signed   By: Van Clines M.D.   On: 10/29/2019 13:27   PERIPHERAL VASCULAR CATHETERIZATION  Result Date: 11/11/2019 See op note    ASSESSMENT & PLAN:  1. Renal cell carcinoma, unspecified laterality ([place])   2. Encounter for antineoplastic immunotherapy   3. Thrombocytopenia ([place])   4. 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  Status post ipilimumab and nivolumab treatment. Currently on maintenance nivolumab treatments Labs are reviewed and discussed with patient. Counts acceptable. He tolerates immunotherapy well.  Proceed with today's durvalumab treatments   #Chronic kidney disease, he has made an appointment with Dr. Candiss Norse    and has an appointment in 2 weeks.  Continue to monitor . #Chronic liver cirrhosis with thrombocytopenia and neutropenia.   Counts are stable.  #Anemia secondary to chronic kidney disease, hemoglobin 9.9, stable.   Iron panel showed ferritin of 146, iron saturation 19.  No severe iron deficiency.  #Slightly elevated TSH, and free T4 is normal., likely secondary to immunotherapy.   Continue to monitor. #Port-A-Cath in place, Follow-up 2 weeks lab MD for assessment evaluation prior to the next cycle of nivolumab. All questions were answered. The patient knows to call the clinic with any problems questions or concerns.   Earlie Server, MD, PhD Hematology Oncology [facility] Pager- [phone] 12/18/2019

## 2019-12-18 NOTE — Progress Notes (Signed)
Pt here for follow up. No new concerns voiced.   

## 2019-12-25 NOTE — Progress Notes (Signed)
Pharmacist Chemotherapy Monitoring - Follow Up Assessment    I verify that I have reviewed each item in the below checklist:  . Regimen for the patient is scheduled for the appropriate day and plan matches scheduled date. Marland Kitchen Appropriate non-routine labs are ordered dependent on drug ordered. . If applicable, additional medications reviewed and ordered per protocol based on lifetime cumulative doses and/or treatment regimen.   Plan for follow-up and/or issues identified: No . I-vent associated with next due treatment: No . MD and/or nursing notified: No  Henry Moore K 12/25/2019 8:39 AM

## 2019-12-31 MED ORDER — SODIUM CHLORIDE 0.9% FLUSH
10.0000 mL | INTRAVENOUS | Status: DC | PRN
  Administered 2020-01-01: 10 mL via INTRAVENOUS
  Filled 2019-12-31: qty 10

## 2020-01-01 ENCOUNTER — Other Ambulatory Visit: Payer: Self-pay

## 2020-01-01 ENCOUNTER — Inpatient Hospital Stay: Payer: BC Managed Care – PPO | Attending: Oncology

## 2020-01-01 ENCOUNTER — Encounter: Payer: Self-pay | Admitting: Oncology

## 2020-01-01 ENCOUNTER — Inpatient Hospital Stay (HOSPITAL_BASED_OUTPATIENT_CLINIC_OR_DEPARTMENT_OTHER): Payer: BC Managed Care – PPO | Admitting: Oncology

## 2020-01-01 ENCOUNTER — Inpatient Hospital Stay: Payer: BC Managed Care – PPO

## 2020-01-01 VITALS — BP 104/67 | HR 72 | Temp 96.8°F | Resp 18 | Wt 250.1 lb

## 2020-01-01 DIAGNOSIS — Z800 Family history of malignant neoplasm of digestive organs: Secondary | ICD-10-CM | POA: Diagnosis not present

## 2020-01-01 DIAGNOSIS — N183 Chronic kidney disease, stage 3 unspecified: Secondary | ICD-10-CM | POA: Diagnosis not present

## 2020-01-01 DIAGNOSIS — R188 Other ascites: Secondary | ICD-10-CM | POA: Insufficient documentation

## 2020-01-01 DIAGNOSIS — D696 Thrombocytopenia, unspecified: Secondary | ICD-10-CM

## 2020-01-01 DIAGNOSIS — K5730 Diverticulosis of large intestine without perforation or abscess without bleeding: Secondary | ICD-10-CM | POA: Insufficient documentation

## 2020-01-01 DIAGNOSIS — K7460 Unspecified cirrhosis of liver: Secondary | ICD-10-CM | POA: Insufficient documentation

## 2020-01-01 DIAGNOSIS — D6959 Other secondary thrombocytopenia: Secondary | ICD-10-CM | POA: Diagnosis not present

## 2020-01-01 DIAGNOSIS — Z794 Long term (current) use of insulin: Secondary | ICD-10-CM | POA: Insufficient documentation

## 2020-01-01 DIAGNOSIS — E1122 Type 2 diabetes mellitus with diabetic chronic kidney disease: Secondary | ICD-10-CM | POA: Diagnosis not present

## 2020-01-01 DIAGNOSIS — I129 Hypertensive chronic kidney disease with stage 1 through stage 4 chronic kidney disease, or unspecified chronic kidney disease: Secondary | ICD-10-CM | POA: Diagnosis not present

## 2020-01-01 DIAGNOSIS — Z87891 Personal history of nicotine dependence: Secondary | ICD-10-CM | POA: Diagnosis not present

## 2020-01-01 DIAGNOSIS — G8929 Other chronic pain: Secondary | ICD-10-CM | POA: Diagnosis not present

## 2020-01-01 DIAGNOSIS — Z95828 Presence of other vascular implants and grafts: Secondary | ICD-10-CM

## 2020-01-01 DIAGNOSIS — M549 Dorsalgia, unspecified: Secondary | ICD-10-CM | POA: Diagnosis not present

## 2020-01-01 DIAGNOSIS — Z5112 Encounter for antineoplastic immunotherapy: Secondary | ICD-10-CM

## 2020-01-01 DIAGNOSIS — Z79899 Other long term (current) drug therapy: Secondary | ICD-10-CM | POA: Insufficient documentation

## 2020-01-01 DIAGNOSIS — C642 Malignant neoplasm of left kidney, except renal pelvis: Secondary | ICD-10-CM | POA: Insufficient documentation

## 2020-01-01 DIAGNOSIS — R161 Splenomegaly, not elsewhere classified: Secondary | ICD-10-CM | POA: Insufficient documentation

## 2020-01-01 DIAGNOSIS — N1832 Chronic kidney disease, stage 3b: Secondary | ICD-10-CM

## 2020-01-01 DIAGNOSIS — I2510 Atherosclerotic heart disease of native coronary artery without angina pectoris: Secondary | ICD-10-CM | POA: Insufficient documentation

## 2020-01-01 DIAGNOSIS — E785 Hyperlipidemia, unspecified: Secondary | ICD-10-CM | POA: Insufficient documentation

## 2020-01-01 DIAGNOSIS — C649 Malignant neoplasm of unspecified kidney, except renal pelvis: Secondary | ICD-10-CM

## 2020-01-01 DIAGNOSIS — D708 Other neutropenia: Secondary | ICD-10-CM

## 2020-01-01 DIAGNOSIS — D631 Anemia in chronic kidney disease: Secondary | ICD-10-CM | POA: Diagnosis not present

## 2020-01-01 DIAGNOSIS — Z803 Family history of malignant neoplasm of breast: Secondary | ICD-10-CM | POA: Diagnosis not present

## 2020-01-01 LAB — CBC WITH DIFFERENTIAL/PLATELET
Abs Immature Granulocytes: 0.01 10*3/uL (ref 0.00–0.07)
Basophils Absolute: 0 10*3/uL (ref 0.0–0.1)
Basophils Relative: 1 %
Eosinophils Absolute: 0.1 10*3/uL (ref 0.0–0.5)
Eosinophils Relative: 6 %
HCT: 28.1 % — ABNORMAL LOW (ref 39.0–52.0)
Hemoglobin: 9.8 g/dL — ABNORMAL LOW (ref 13.0–17.0)
Immature Granulocytes: 1 %
Lymphocytes Relative: 24 %
Lymphs Abs: 0.4 10*3/uL — ABNORMAL LOW (ref 0.7–4.0)
MCH: 31.9 pg (ref 26.0–34.0)
MCHC: 34.9 g/dL (ref 30.0–36.0)
MCV: 91.5 fL (ref 80.0–100.0)
Monocytes Absolute: 0.4 10*3/uL (ref 0.1–1.0)
Monocytes Relative: 21 %
Neutro Abs: 0.9 10*3/uL — ABNORMAL LOW (ref 1.7–7.7)
Neutrophils Relative %: 47 %
Platelets: 66 10*3/uL — ABNORMAL LOW (ref 150–400)
RBC: 3.07 MIL/uL — ABNORMAL LOW (ref 4.22–5.81)
RDW: 13.3 % (ref 11.5–15.5)
WBC: 1.8 10*3/uL — ABNORMAL LOW (ref 4.0–10.5)
nRBC: 0 % (ref 0.0–0.2)

## 2020-01-01 LAB — COMPREHENSIVE METABOLIC PANEL
ALT: 25 U/L (ref 0–44)
AST: 34 U/L (ref 15–41)
Albumin: 4.1 g/dL (ref 3.5–5.0)
Alkaline Phosphatase: 94 U/L (ref 38–126)
Anion gap: 8 (ref 5–15)
BUN: 28 mg/dL — ABNORMAL HIGH (ref 6–20)
CO2: 22 mmol/L (ref 22–32)
Calcium: 9 mg/dL (ref 8.9–10.3)
Chloride: 104 mmol/L (ref 98–111)
Creatinine, Ser: 1.8 mg/dL — ABNORMAL HIGH (ref 0.61–1.24)
GFR calc Af Amer: 48 mL/min — ABNORMAL LOW (ref >60)
GFR calc non Af Amer: 41 mL/min — ABNORMAL LOW (ref >60)
Glucose, Bld: 91 mg/dL (ref 70–99)
Potassium: 4.8 mmol/L (ref 3.5–5.1)
Sodium: 134 mmol/L — ABNORMAL LOW (ref 135–145)
Total Bilirubin: 1.2 mg/dL (ref 0.3–1.2)
Total Protein: 7.8 g/dL (ref 6.5–8.1)

## 2020-01-01 LAB — TSH: TSH: 4.51 u[IU]/mL — ABNORMAL HIGH (ref 0.350–4.500)

## 2020-01-01 MED ORDER — HEPARIN SOD (PORK) LOCK FLUSH 100 UNIT/ML IV SOLN
500.0000 [IU] | Freq: Once | INTRAVENOUS | Status: AC
  Administered 2020-01-01: 500 [IU] via INTRAVENOUS
  Filled 2020-01-01: qty 5

## 2020-01-01 MED ORDER — HEPARIN SOD (PORK) LOCK FLUSH 100 UNIT/ML IV SOLN
INTRAVENOUS | Status: AC
  Filled 2020-01-01: qty 5

## 2020-01-01 MED ORDER — VITAMIN B-12 1000 MCG PO TABS
1000.0000 ug | ORAL_TABLET | Freq: Every day | ORAL | 0 refills | Status: AC
Start: 2020-01-01 — End: ?

## 2020-01-01 NOTE — Progress Notes (Signed)
Patient denies new problems/concerns today.   °

## 2020-01-01 NOTE — Progress Notes (Signed)
Hematology/Oncology follow-up [facility] [phone] [phone]   Patient Care Team: Albina Billet, MD as PCP - General (Internal Medicine) Earlie Server, MD as Consulting Physician (Hematology and Oncology)  REFERRING PROVIDER: Albina Billet, MD  CHIEF COMPLAINTS/REASON FOR VISIT:  Follow-up for kidney cancer treatments  HISTORY OF PRESENTING ILLNESS:   Henry Moore is a  59 y.o.  male with PMH listed below was seen in consultation at the request of  Albina Billet, MD  for evaluation of kidney cancer Extensive medical records were reviewed Patient had MRI lumbar spine without contrast done on 07/13/2018 which showed mired lumbar degenerative disease. CT thoracic spine was done on 08/05/2018 which showed 5 cm left renal mass. 09/04/2018 CT abdomen and pelvis with and without contrast showed 5 cm round enhancing solid mass in the left kidney.  No involvement of left renal vein.  No lymphadenopathy Morphologic changes consistent with cirrhosis and the portal hypertension.  Small volume ascites and splenomegaly. . 10/16/2018 Left nephrectomy showed renal cell carcinoma, clear-cell type, nuclear grade 4, tumor extends into the renal vein and renal sinus fat.  Urethral, vascular and or resection margins are negative for tumor pT3a Nx Mx  01/28/2019 patient had another CT chest abdomen pelvis done with contrast Showed interval left nephrectomy.  Scattered tiny pulmonary nodules bilaterally.  Nonspecific.  No other evidence of metastatic disease.  Cirrhosis changes extensive coronary and aortic atherosclerosis  07/14/2019 patient underwent surveillance CT chest abdomen pelvis without contrast Interval progression of pulmonary metastasis with new enlarged right paratracheal lymph node 1.7 cm, previously 0.6 cm one-point since worrisome for metastatic adenopathy.  Multiple progressive pulmonary nodules, index nodule within the lingula measures 1.3 cm,  previously 3 mm. Index subpleural nodule within the anterior right upper lobe measuring 1.8 cm, previously 3 cm Index nodule within the post lateral right lower lobe measuring 5 mm, new from previous care Aortic sclerosis.  Three-vessel coronary artery calcification noted.  Cirrhosis changes.  Splenomegaly.  Patient was referred to establish care with medical oncology for further discussion and evaluation of metastatic renal cell carcinoma. Patient reports feeling well at baseline today.  Denies any shortness of breath, cough, hemoptysis, back pain, headache. He quitted smoking 2015.  Not currently actively drinking alcohol as well. Patient has chronic back pain unchanged.  # Liver cirrhosis with small amount of ascites/splenomegaly patient sees gastroenterology Dr. [name].  . He will get ultrasound surveillance due in February 2021 for screening of [place].  EGD surveillance for Barrett's plan in April 2021.  # Diabetes, on Humalog sliding scale and metformin.  #Family history of cancer and personal history of RCC.  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   # 10/29/2019 CT chest abdomen pelvis without contrast images were independently reviewed by me and discussed with patient.   CT showed marked improvement with resolution of many of the pulmonary nodules, prominent reduced size of other nodules.  Considerably reduced size of the right lower paratracheal lymph node now 0.9 cm in diameter. Hepatic cirrhosis with prominent splenomegaly and trace perisplenic ascites. Chronic CT findings includes aortic atherosclerotic vascular disease.  Mild sigmoid colon diverticulosis  INTERVAL HISTORY Henry Moore is a 59 y.o. male who has above history reviewed by me today presents for follow up visit for management of stage IV RCC  Problems and complaints are listed below: Patient has no new complaints.  Denies any skin rash, diarrhea, fever or chills.  No breathing  difficulties Chronic  fatigue is at baseline  Review of Systems  Constitutional: Positive for fatigue. Negative for appetite change, chills, fever and unexpected weight change.  HENT:   Negative for hearing loss and voice change.   Eyes: Negative for eye problems and icterus.  Respiratory: Negative for chest tightness, cough and shortness of breath.   Cardiovascular: Negative for chest pain and leg swelling.  Gastrointestinal: Negative for abdominal distention and abdominal pain.  Endocrine: Negative for hot flashes.  Genitourinary: Negative for difficulty urinating, dysuria and frequency.   Musculoskeletal: Negative for arthralgias.       Chronic back pain  Skin: Negative for itching and rash.  Neurological: Negative for light-headedness and numbness.  Hematological: Negative for adenopathy. Does not bruise/bleed easily.  Psychiatric/Behavioral: Negative for confusion.    MEDICAL HISTORY:  Past Medical History:  Diagnosis Date  . Cough    SINUS DRAINAGE CAUSING COUGHING , REPORTS CLEAR MUCOUS   . Diabetes mellitus without complication ([place])   . Family history of breast cancer   . Family history of colon cancer   . Family history of stomach cancer   . HTN (hypertension)   . Hyperlipemia   . Left renal mass   . Platelets decreased ([place])    DENIES UNSUAL BLEEDING   . PONV (postoperative nausea and vomiting)   . Renal cell carcinoma ([place]) 08/04/2019  . WBC decreased     SURGICAL HISTORY: Past Surgical History:  Procedure Laterality Date  . ANKLE SURGERY Left   . ANTERIOR CERVICAL DECOMP/DISCECTOMY FUSION N/A 04/16/2014   Procedure: ANTERIOR CERVICAL DECOMPRESSION/DISCECTOMY FUSION  (ACDF C5-C7)   (2 LEVELS)     ;  Surgeon: Melina Schools, MD;  Location: [facility];  Service: Orthopedics;  Laterality: N/A;  . APPENDECTOMY    . BACK SURGERY    . ESOPHAGOGASTRODUODENOSCOPY (EGD) WITH PROPOFOL N/A 02/06/2019   Procedure: ESOPHAGOGASTRODUODENOSCOPY (EGD) WITH PROPOFOL;  Surgeon: [name], MD;  Location: [facility] ENDOSCOPY;  Service: Gastroenterology;  Laterality: N/A;  . ESOPHAGOGASTRODUODENOSCOPY (EGD) WITH PROPOFOL N/A 03/04/2019   Procedure: ESOPHAGOGASTRODUODENOSCOPY (EGD) WITH PROPOFOL;  Surgeon: Lin Landsman, MD;  Location: [facility] ENDOSCOPY;  Service: Gastroenterology;  Laterality: N/A;  . ESOPHAGOGASTRODUODENOSCOPY (EGD) WITH PROPOFOL N/A 04/22/2019   Procedure: ESOPHAGOGASTRODUODENOSCOPY (EGD) WITH PROPOFOL;  Surgeon: [name], MD;  Location: [facility] ENDOSCOPY;  Service: Gastroenterology;  Laterality: N/A;  . FRACTURE SURGERY    . HYDROCELE EXCISION    . KNEE ARTHROSCOPY Bilateral   . LAPAROSCOPIC NEPHRECTOMY, HAND ASSISTED Left 10/16/2018   Procedure: LEFT HAND ASSISTED LAPAROSCOPIC NEPHRECTOMY;  Surgeon: Lucas Mallow, MD;  Location: [facility] ORS;  Service: Urology;  Laterality: Left;  . NASAL SINUS SURGERY     X 2  . 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 PORTA CATH INSERTION N/A 11/11/2019   Procedure: PORTA CATH INSERTION;  Surgeon: Katha Cabal, MD;  Location: [place] [facility] CV LAB;  Service: Cardiovascular;  Laterality: N/A;  . SHOULDER SURGERY Left     SOCIAL HISTORY: Social History   Socioeconomic History  . Marital status: Married    Spouse name: Not on file  . Number of children: Not on file  . Years of education: Not on file  . Highest education level: Not on file  Occupational History  . Not on file  Tobacco Use  . Smoking status: Former Smoker    Packs/day: 0.25    Years: 20.00    Pack years: 5.00    Quit date: 2015    Years since  quitting: 6.4  . Smokeless tobacco: Never Used  Substance and Sexual Activity  . Alcohol use: Not Currently    Alcohol/week: 0.0 standard drinks    Comment: no alchol in 1 year  . Drug use: No  . Sexual activity: Not on file  Other Topics Concern  . Not on file  Social History Narrative  . Not on file   Social Determinants of Health   Financial Resource Strain:   . Difficulty of Paying  Living Expenses:   Food Insecurity:   . Worried About Charity fundraiser in the Last Year:   . Arboriculturist in the Last Year:   Transportation Needs:   . Film/video editor (Medical):   Marland Kitchen Lack of Transportation (Non-Medical):   Physical Activity:   . Days of Exercise per Week:   . Minutes of Exercise per Session:   Stress:   . Feeling of Stress :   Social Connections:   . Frequency of Communication with Friends and Family:   . Frequency of Social Gatherings with Friends and Family:   . Attends Religious Services:   . Active Member of Clubs or Organizations:   . Attends Archivist Meetings:   Marland Kitchen Marital Status:   Intimate Partner Violence:   . Fear of Current or Ex-Partner:   . Emotionally Abused:   Marland Kitchen Physically Abused:   . Sexually Abused:     FAMILY HISTORY: Family History  Problem Relation Age of Onset  . Diabetes Mother   . Cancer Mother        neuroendocrine cancer  . Cancer Father        neuroendocrine cancer of meninges  . Cancer Maternal Grandmother        tomach dx 57  . Alzheimer's disease Paternal Grandmother   . Lung cancer Paternal Grandfather   . Lung cancer Maternal Aunt   . Colon cancer Maternal Uncle   . Breast cancer Paternal Aunt        both dx 48s  . Ovarian cancer Other        dx 64  . Breast cancer Cousin     ALLERGIES:  is allergic to codeine and mucinex [guaifenesin er].  MEDICATIONS:  Current Outpatient Medications  Medication Sig Dispense Refill  . amLODipine (NORVASC) 10 MG tablet Take 10 mg by mouth daily.    . carvedilol (COREG) 25 MG tablet Take 1 tablet (25 mg total) by mouth 2 (two) times daily with a meal. 180 tablet 3  . Cholecalciferol (VITAMIN D3) 125 MCG (5000 UT) CAPS Take 5,000 Units by mouth daily.    Marland Kitchen glipiZIDE (GLUCOTROL) 5 MG tablet Take 1 tablet (5 mg total) by mouth daily before breakfast. 90 tablet 3  . hydrochlorothiazide (HYDRODIURIL) 25 MG tablet TAKE 1 TABLET DAILY. (Patient taking differently:  Take 25 mg by mouth daily. ) 90 tablet 0  . insulin lispro (INSULIN LISPRO) 100 UNIT/ML KwikPen Junior Inject into the skin 3 (three) times daily as needed (Above 250). Sliding scale    . losartan (COZAAR) 100 MG tablet Take 1 tablet by mouth daily. (Patient taking differently: Take 100 mg by mouth daily. ) 90 tablet 0  . lovastatin (MEVACOR) 40 MG tablet Take 1 tablet (40 mg total) by mouth at bedtime. 60 tablet 0  . metFORMIN (GLUCOPHAGE-XR) 500 MG 24 hr tablet Take 500 mg by mouth 2 (two) times daily.     . prochlorperazine (COMPAZINE) 10 MG tablet Take 1 tablet (10  mg total) by mouth every 6 (six) hours as needed for nausea or vomiting. 30 tablet 0  . ACCU-CHEK AVIVA PLUS test strip CHECK BL00D SUGAR ONCE OR TWICE DAILY. (Patient not taking: No sig reported) 100 each PRN  . vitamin B-12 (CYANOCOBALAMIN) 1000 MCG tablet Take 1 tablet (1,000 mcg total) by mouth daily. 90 tablet 0   No current facility-administered medications for this visit.   Facility-Administered Medications Ordered in Other Visits  Medication Dose Route Frequency Provider Last Rate Last Admin  . sodium chloride flush (NS) 0.9 % injection 10 mL  10 mL Intravenous PRN Earlie Server, MD   10 mL at 01/01/20 0810     PHYSICAL EXAMINATION: ECOG PERFORMANCE STATUS: 1 - Symptomatic but completely ambulatory Vitals:   01/01/20 0836  BP: 104/67  Pulse: 72  Resp: 18  Temp: (!) 96.8 F (36 C)   Filed Weights   01/01/20 0836  Weight: 250 lb 1.6 oz (113.4 kg)    Physical Exam Constitutional:      General: He is not in acute distress.    Appearance: He is obese.  HENT:     Head: Normocephalic and atraumatic.  Eyes:     General: No scleral icterus. Cardiovascular:     Rate and Rhythm: Normal rate and regular rhythm.     Heart sounds: Normal heart sounds.  Pulmonary:     Effort: Pulmonary effort is normal. No respiratory distress.     Breath sounds: No wheezing.  Abdominal:     General: Bowel sounds are normal. There is  no distension.     Palpations: Abdomen is soft.  Musculoskeletal:        General: No deformity. Normal range of motion.     Cervical back: Normal range of motion and neck supple.  Skin:    General: Skin is warm and dry.     Findings: No erythema or rash.  Neurological:     Mental Status: He is alert and oriented to person, place, and time. Mental status is at baseline.     Cranial Nerves: No cranial nerve deficit.     Coordination: Coordination normal.  Psychiatric:        Mood and Affect: Mood normal.     LABORATORY DATA:  I have reviewed the data as listed Lab Results  Component Value Date   WBC 1.8 (L) 01/01/2020   HGB 9.8 (L) 01/01/2020   HCT 28.1 (L) 01/01/2020   MCV 91.5 01/01/2020   PLT 66 (L) 01/01/2020   Recent Labs    12/04/19 0838 12/18/19 0826 01/01/20 0807  NA 136 134* 134*  K 4.3 4.3 4.8  CL 106 104 104  CO2 21* 21* 22  GLUCOSE 79 120* 91  BUN 35* 36* 28*  CREATININE 2.04* 1.97* 1.80*  CALCIUM 8.8* 8.8* 9.0  GFRNONAA 35* 37* 41*  GFRAA 41* 43* 48*  PROT 8.0 7.8 7.8  ALBUMIN 4.1 4.1 4.1  AST 31 34 34  ALT 21 28 25   ALKPHOS 101 120 94  BILITOT 1.1 0.8 1.2   Iron/TIBC/Ferritin/ %Sat    Component Value Date/Time   IRON 52 12/18/2019 0826   IRON 105 10/15/2018 1325   TIBC 284 12/18/2019 0826   TIBC 244 (L) 10/15/2018 1325   FERRITIN 146 12/18/2019 0826   FERRITIN 588 (H) 10/15/2018 1325   IRONPCTSAT 18 12/18/2019 0826   IRONPCTSAT 43 10/15/2018 1325      RADIOGRAPHIC STUDIES: I have personally reviewed the radiological images as listed and  agreed with the findings in the report. CT ABDOMEN PELVIS WO CONTRAST  Result Date: 10/29/2019 CLINICAL DATA:  Metastatic left renal cell carcinoma, prior nephrectomy. EXAM: CT CHEST, ABDOMEN AND PELVIS WITHOUT CONTRAST TECHNIQUE: Multidetector CT imaging of the chest, abdomen and pelvis was performed following the standard protocol without IV contrast. COMPARISON:  Multiple exams, including 07/14/2019  FINDINGS: CT CHEST FINDINGS Cardiovascular: Coronary, aortic arch, and branch vessel atherosclerotic vascular disease. Mediastinum/Nodes: Right lower paratracheal node 0.9 cm in short axis on image 21/504, previously 1.8 cm by my measurements. Small stable left internal mammary lymph nodes including a 0.6 cm lymph node on image 21/504. Lungs/Pleura: Marked improvement, with resolution of many of the pulmonary nodules, and prominently reduced size of other nodules. For example, a previously dominant 1.4 by 1.2 cm lingular nodule has completely resolved. A subpleural nodule in the right upper lobe on image 38/505 currently measures 0.4 cm in thickness on image 38/505, previously 0.8 cm in thickness. No new nodules identified. Musculoskeletal: Lower cervical plate and screw fixator. Lower thoracic spondylosis. CT ABDOMEN PELVIS FINDINGS Hepatobiliary: Nodular contour and morphology compatible with cirrhosis common no contour change from prior. Suspected gallbladder wall thickening. Pancreas: Unremarkable Spleen: Splenomegaly. The spleen measures 15.4 by 9.8 by 20.0 cm (volume = 1600 cm^3). Adrenals/Urinary Tract: Both adrenal glands appear normal. Unremarkable contour of the right kidney without findings of urinary tract calculi. Left nephrectomy. Stomach/Bowel: Mild sigmoid colon diverticulosis. Vascular/Lymphatic: Aortoiliac atherosclerotic vascular disease. Scattered small right gastric and peripancreatic lymph nodes not appreciably changed from prior. No overt pathologic adenopathy observed. Reproductive: Penile implant. Dystrophic calcifications along the prostate gland. Other: Stable mild mesenteric edema. Trace perisplenic ascites. Musculoskeletal: Unremarkable IMPRESSION: 1. Marked improvement, with resolution of many of the pulmonary nodules, and prominently reduced size of other nodules. Considerably reduced size of the right lower paratracheal lymph node, now 0.9 cm in diameter. 2. Hepatic cirrhosis with  prominent splenomegaly and trace perisplenic ascites. 3. Other imaging findings of potential clinical significance: Coronary, aortic arch, and branch vessel atherosclerotic vascular disease. Mild sigmoid colon diverticulosis. Aortic Atherosclerosis (ICD10-I70.0). Electronically Signed   By: Van Clines M.D.   On: 10/29/2019 13:27   CT CHEST WO CONTRAST  Result Date: 10/29/2019 CLINICAL DATA:  Metastatic left renal cell carcinoma, prior nephrectomy. EXAM: CT CHEST, ABDOMEN AND PELVIS WITHOUT CONTRAST TECHNIQUE: Multidetector CT imaging of the chest, abdomen and pelvis was performed following the standard protocol without IV contrast. COMPARISON:  Multiple exams, including 07/14/2019 FINDINGS: CT CHEST FINDINGS Cardiovascular: Coronary, aortic arch, and branch vessel atherosclerotic vascular disease. Mediastinum/Nodes: Right lower paratracheal node 0.9 cm in short axis on image 21/504, previously 1.8 cm by my measurements. Small stable left internal mammary lymph nodes including a 0.6 cm lymph node on image 21/504. Lungs/Pleura: Marked improvement, with resolution of many of the pulmonary nodules, and prominently reduced size of other nodules. For example, a previously dominant 1.4 by 1.2 cm lingular nodule has completely resolved. A subpleural nodule in the right upper lobe on image 38/505 currently measures 0.4 cm in thickness on image 38/505, previously 0.8 cm in thickness. No new nodules identified. Musculoskeletal: Lower cervical plate and screw fixator. Lower thoracic spondylosis. CT ABDOMEN PELVIS FINDINGS Hepatobiliary: Nodular contour and morphology compatible with cirrhosis common no contour change from prior. Suspected gallbladder wall thickening. Pancreas: Unremarkable Spleen: Splenomegaly. The spleen measures 15.4 by 9.8 by 20.0 cm (volume = 1600 cm^3). Adrenals/Urinary Tract: Both adrenal glands appear normal. Unremarkable contour of the right kidney without findings of urinary tract  calculi.  Left nephrectomy. Stomach/Bowel: Mild sigmoid colon diverticulosis. Vascular/Lymphatic: Aortoiliac atherosclerotic vascular disease. Scattered small right gastric and peripancreatic lymph nodes not appreciably changed from prior. No overt pathologic adenopathy observed. Reproductive: Penile implant. Dystrophic calcifications along the prostate gland. Other: Stable mild mesenteric edema. Trace perisplenic ascites. Musculoskeletal: Unremarkable IMPRESSION: 1. Marked improvement, with resolution of many of the pulmonary nodules, and prominently reduced size of other nodules. Considerably reduced size of the right lower paratracheal lymph node, now 0.9 cm in diameter. 2. Hepatic cirrhosis with prominent splenomegaly and trace perisplenic ascites. 3. Other imaging findings of potential clinical significance: Coronary, aortic arch, and branch vessel atherosclerotic vascular disease. Mild sigmoid colon diverticulosis. Aortic Atherosclerosis (ICD10-I70.0). Electronically Signed   By: Van Clines M.D.   On: 10/29/2019 13:27   PERIPHERAL VASCULAR CATHETERIZATION  Result Date: 11/11/2019 See op note    ASSESSMENT & PLAN:  1. Encounter for antineoplastic immunotherapy   2. Thrombocytopenia ([place])   3. Chronic kidney disease, stage 3b   4. Renal cell carcinoma, unspecified laterality (HCC)   5. 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  Status post ipilimumab and nivolumab treatment. Currently on maintenance nivolumab treatments Labs are reviewed and discussed with patient. Hold off treatment today due to neutropenia. See below.   # Neutropenia, worsened.  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  This is considered to be secondary to chronic liver disease. Leukopenia seems to be progressively  worsening now grade 2 neutropenia. Hold off immunotherapy for now.  Autoimmune neutropenia is a possibility. Vitamin B12 level has been checked in the past and is at the low normal end.  Recommend patient to start vitamin B12 supplementation daily.  Prescription was sent to the pharmacy.  Repeat blood work next week. I will check flow cytometry as well.  Chronic thrombocytopenia, stable. #Chronic kidney disease, he has made an appointment with Dr. Candiss Norse  and has an appointment in 2 weeks.  Continue to monitor  #Chronic liver cirrhosis with thrombocytopenia and neutropenia.   Counts are stable.  #Anemia secondary to chronic kidney disease, hemoglobin 9.8, stable.   No iron deficiency.  #Slightly elevated TSH, and free T4 is normal., likely secondary to immunotherapy.   Continue to monitor. #Port-A-Cath in place, Follow-up 1 week lab MD for assessment evaluation prior to nivolumab. All questions were answered. The patient knows to call the clinic with any problems questions or concerns.   Earlie Server, MD, PhD Hematology Oncology [facility] at [facility] Pager- [phone] 01/01/2020

## 2020-01-08 ENCOUNTER — Inpatient Hospital Stay: Payer: BC Managed Care – PPO

## 2020-01-08 ENCOUNTER — Encounter: Payer: Self-pay | Admitting: Oncology

## 2020-01-08 ENCOUNTER — Other Ambulatory Visit: Payer: Self-pay

## 2020-01-08 ENCOUNTER — Inpatient Hospital Stay (HOSPITAL_BASED_OUTPATIENT_CLINIC_OR_DEPARTMENT_OTHER): Payer: BC Managed Care – PPO | Admitting: Oncology

## 2020-01-08 VITALS — BP 112/70 | HR 71 | Temp 96.1°F | Resp 18 | Wt 250.8 lb

## 2020-01-08 DIAGNOSIS — C642 Malignant neoplasm of left kidney, except renal pelvis: Secondary | ICD-10-CM | POA: Diagnosis not present

## 2020-01-08 DIAGNOSIS — Z5112 Encounter for antineoplastic immunotherapy: Secondary | ICD-10-CM | POA: Diagnosis not present

## 2020-01-08 DIAGNOSIS — C649 Malignant neoplasm of unspecified kidney, except renal pelvis: Secondary | ICD-10-CM

## 2020-01-08 DIAGNOSIS — N1832 Chronic kidney disease, stage 3b: Secondary | ICD-10-CM

## 2020-01-08 DIAGNOSIS — D708 Other neutropenia: Secondary | ICD-10-CM | POA: Diagnosis not present

## 2020-01-08 LAB — COMPREHENSIVE METABOLIC PANEL
ALT: 29 U/L (ref 0–44)
AST: 35 U/L (ref 15–41)
Albumin: 4.1 g/dL (ref 3.5–5.0)
Alkaline Phosphatase: 108 U/L (ref 38–126)
Anion gap: 8 (ref 5–15)
BUN: 25 mg/dL — ABNORMAL HIGH (ref 6–20)
CO2: 22 mmol/L (ref 22–32)
Calcium: 8.8 mg/dL — ABNORMAL LOW (ref 8.9–10.3)
Chloride: 106 mmol/L (ref 98–111)
Creatinine, Ser: 1.74 mg/dL — ABNORMAL HIGH (ref 0.61–1.24)
GFR calc Af Amer: 50 mL/min — ABNORMAL LOW (ref >60)
GFR calc non Af Amer: 43 mL/min — ABNORMAL LOW (ref >60)
Glucose, Bld: 114 mg/dL — ABNORMAL HIGH (ref 70–99)
Potassium: 4.6 mmol/L (ref 3.5–5.1)
Sodium: 136 mmol/L (ref 135–145)
Total Bilirubin: 0.9 mg/dL (ref 0.3–1.2)
Total Protein: 7.9 g/dL (ref 6.5–8.1)

## 2020-01-08 LAB — CBC WITH DIFFERENTIAL/PLATELET
Abs Immature Granulocytes: 0 10*3/uL (ref 0.00–0.07)
Basophils Absolute: 0 10*3/uL (ref 0.0–0.1)
Basophils Relative: 1 %
Eosinophils Absolute: 0.1 10*3/uL (ref 0.0–0.5)
Eosinophils Relative: 5 %
HCT: 28.1 % — ABNORMAL LOW (ref 39.0–52.0)
Hemoglobin: 9.6 g/dL — ABNORMAL LOW (ref 13.0–17.0)
Immature Granulocytes: 0 %
Lymphocytes Relative: 23 %
Lymphs Abs: 0.4 10*3/uL — ABNORMAL LOW (ref 0.7–4.0)
MCH: 31.2 pg (ref 26.0–34.0)
MCHC: 34.2 g/dL (ref 30.0–36.0)
MCV: 91.2 fL (ref 80.0–100.0)
Monocytes Absolute: 0.2 10*3/uL (ref 0.1–1.0)
Monocytes Relative: 12 %
Neutro Abs: 1.1 10*3/uL — ABNORMAL LOW (ref 1.7–7.7)
Neutrophils Relative %: 59 %
Platelets: 57 10*3/uL — ABNORMAL LOW (ref 150–400)
RBC: 3.08 MIL/uL — ABNORMAL LOW (ref 4.22–5.81)
RDW: 13.2 % (ref 11.5–15.5)
WBC: 1.9 10*3/uL — ABNORMAL LOW (ref 4.0–10.5)
nRBC: 0 % (ref 0.0–0.2)

## 2020-01-08 MED ORDER — HEPARIN SOD (PORK) LOCK FLUSH 100 UNIT/ML IV SOLN
INTRAVENOUS | Status: AC
  Filled 2020-01-08: qty 5

## 2020-01-08 MED ORDER — SODIUM CHLORIDE 0.9 % IV SOLN
240.0000 mg | Freq: Once | INTRAVENOUS | Status: AC
  Administered 2020-01-08: 240 mg via INTRAVENOUS
  Filled 2020-01-08: qty 24

## 2020-01-08 MED ORDER — SODIUM CHLORIDE 0.9 % IV SOLN
Freq: Once | INTRAVENOUS | Status: AC
  Filled 2020-01-08: qty 250

## 2020-01-08 MED ORDER — HEPARIN SOD (PORK) LOCK FLUSH 100 UNIT/ML IV SOLN
500.0000 [IU] | Freq: Once | INTRAVENOUS | Status: AC | PRN
  Administered 2020-01-08: 500 [IU]
  Filled 2020-01-08: qty 5

## 2020-01-08 MED ORDER — SODIUM CHLORIDE 0.9% FLUSH
10.0000 mL | Freq: Once | INTRAVENOUS | Status: AC
  Administered 2020-01-08: 10 mL via INTRAVENOUS
  Filled 2020-01-08: qty 10

## 2020-01-08 NOTE — Progress Notes (Signed)
Labs reviewed with MD and treatment team. Per MD to continue with treatment. Pt updated and all questions answered at this time.   Kenny Rea [employer]

## 2020-01-08 NOTE — Progress Notes (Signed)
Patient here for follow up. No new concerns voiced.  °

## 2020-01-08 NOTE — Progress Notes (Signed)
Hematology/Oncology follow-up [facility] Telephone:([phone] [phone]   Patient Care Team: Albina Billet, MD as PCP - General (Internal Medicine) Earlie Server, MD as Consulting Physician (Hematology and Oncology)  REFERRING PROVIDER: Albina Billet, MD  CHIEF COMPLAINTS/REASON FOR VISIT:  Follow-up for kidney cancer treatments  HISTORY OF PRESENTING ILLNESS:   Henry Moore is a  59 y.o.  male with PMH listed below was seen in consultation at the request of  Albina Billet, MD  for evaluation of kidney cancer Extensive medical records were reviewed Patient had MRI lumbar spine without contrast done on 07/13/2018 which showed mired lumbar degenerative disease. CT thoracic spine was done on 08/05/2018 which showed 5 cm left renal mass. 09/04/2018 CT abdomen and pelvis with and without contrast showed 5 cm round enhancing solid mass in the left kidney.  No involvement of left renal vein.  No lymphadenopathy Morphologic changes consistent with cirrhosis and the portal hypertension.  Small volume ascites and splenomegaly. . 10/16/2018 Left nephrectomy showed renal cell carcinoma, clear-cell type, nuclear grade 4, tumor extends into the renal vein and renal sinus fat.  Urethral, vascular and or resection margins are negative for tumor pT3a Nx Mx  01/28/2019 patient had another CT chest abdomen pelvis done with contrast Showed interval left nephrectomy.  Scattered tiny pulmonary nodules bilaterally.  Nonspecific.  No other evidence of metastatic disease.  Cirrhosis changes extensive coronary and aortic atherosclerosis  07/14/2019 patient underwent surveillance CT chest abdomen pelvis without contrast Interval progression of pulmonary metastasis with new enlarged right paratracheal lymph node 1.7 cm, previously 0.6 cm one-point since worrisome for metastatic adenopathy.  Multiple progressive pulmonary nodules, index nodule within the lingula measures 1.3 cm,  previously 3 mm. Index subpleural nodule within the anterior right upper lobe measuring 1.8 cm, previously 3 cm Index nodule within the post lateral right lower lobe measuring 5 mm, new from previous care Aortic sclerosis.  Three-vessel coronary artery calcification noted.  Cirrhosis changes.  Splenomegaly.  Patient was referred to establish care with medical oncology for further discussion and evaluation of metastatic renal cell carcinoma. Patient reports feeling well at baseline today.  Denies any shortness of breath, cough, hemoptysis, back pain, headache. He quitted smoking 2015.  Not currently actively drinking alcohol as well. Patient has chronic back pain unchanged.  # Liver cirrhosis with small amount of ascites/splenomegaly patient sees gastroenterology Dr. [name].  . He will get ultrasound surveillance due in February 2021 for screening of [place].  EGD surveillance for Barrett's plan in April 2021.  # Diabetes, on Humalog sliding scale and metformin.  #Family history of cancer and personal history of RCC.  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   # 10/29/2019 CT chest abdomen pelvis without contrast images were independently reviewed by me and discussed with patient.   CT showed marked improvement with resolution of many of the pulmonary nodules, prominent reduced size of other nodules.  Considerably reduced size of the right lower paratracheal lymph node now 0.9 cm in diameter. Hepatic cirrhosis with prominent splenomegaly and trace perisplenic ascites. Chronic CT findings includes aortic atherosclerotic vascular disease.  Mild sigmoid colon diverticulosis  INTERVAL HISTORY Henry Moore is a 59 y.o. male who has above history reviewed by me today presents for follow up visit for management of stage IV RCC  Problems and complaints are listed below: No new complaints.  Denies any skin rash, diarrhea, fever or chills.  No breathing difficulties Chronic fatigue is  at baseline  Review of Systems  Constitutional: Positive for fatigue. Negative for appetite change, chills, fever and unexpected weight change.  HENT:   Negative for hearing loss and voice change.   Eyes: Negative for eye problems and icterus.  Respiratory: Negative for chest tightness, cough and shortness of breath.   Cardiovascular: Negative for chest pain and leg swelling.  Gastrointestinal: Negative for abdominal distention and abdominal pain.  Endocrine: Negative for hot flashes.  Genitourinary: Negative for difficulty urinating, dysuria and frequency.   Musculoskeletal: Negative for arthralgias.       Chronic back pain  Skin: Negative for itching and rash.  Neurological: Negative for light-headedness and numbness.  Hematological: Negative for adenopathy. Does not bruise/bleed easily.  Psychiatric/Behavioral: Negative for confusion.    MEDICAL HISTORY:  Past Medical History:  Diagnosis Date  . Cough    SINUS DRAINAGE CAUSING COUGHING , REPORTS CLEAR MUCOUS   . Diabetes mellitus without complication ([place])   . Family history of breast cancer   . Family history of colon cancer   . Family history of stomach cancer   . HTN (hypertension)   . Hyperlipemia   . Left renal mass   . Platelets decreased ([place])    DENIES UNSUAL BLEEDING   . PONV (postoperative nausea and vomiting)   . Renal cell carcinoma ([place]) 08/04/2019  . WBC decreased     SURGICAL HISTORY: Past Surgical History:  Procedure Laterality Date  . ANKLE SURGERY Left   . ANTERIOR CERVICAL DECOMP/DISCECTOMY FUSION N/A 04/16/2014   Procedure: ANTERIOR CERVICAL DECOMPRESSION/DISCECTOMY FUSION  (ACDF C5-C7)   (2 LEVELS)     ;  Surgeon: Melina Schools, MD;  Location: [place];  Service: Orthopedics;  Laterality: N/A;  . APPENDECTOMY    . BACK SURGERY    . ESOPHAGOGASTRODUODENOSCOPY (EGD) WITH PROPOFOL N/A 02/06/2019   Procedure: ESOPHAGOGASTRODUODENOSCOPY (EGD) WITH PROPOFOL;  Surgeon: [name], MD;  Location: [facility]  ENDOSCOPY;  Service: Gastroenterology;  Laterality: N/A;  . ESOPHAGOGASTRODUODENOSCOPY (EGD) WITH PROPOFOL N/A 03/04/2019   Procedure: ESOPHAGOGASTRODUODENOSCOPY (EGD) WITH PROPOFOL;  Surgeon: Lin Landsman, MD;  Location: [facility] ENDOSCOPY;  Service: Gastroenterology;  Laterality: N/A;  . ESOPHAGOGASTRODUODENOSCOPY (EGD) WITH PROPOFOL N/A 04/22/2019   Procedure: ESOPHAGOGASTRODUODENOSCOPY (EGD) WITH PROPOFOL;  Surgeon: [name], MD;  Location: [facility] ENDOSCOPY;  Service: Gastroenterology;  Laterality: N/A;  . FRACTURE SURGERY    . HYDROCELE EXCISION    . KNEE ARTHROSCOPY Bilateral   . LAPAROSCOPIC NEPHRECTOMY, HAND ASSISTED Left 10/16/2018   Procedure: LEFT HAND ASSISTED LAPAROSCOPIC NEPHRECTOMY;  Surgeon: Lucas Mallow, MD;  Location: [facility];  Service: Urology;  Laterality: Left;  . NASAL SINUS SURGERY     X 2  . 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 PORTA CATH INSERTION N/A 11/11/2019   Procedure: PORTA CATH INSERTION;  Surgeon: Katha Cabal, MD;  Location: [facility];  Service: Cardiovascular;  Laterality: N/A;  . SHOULDER SURGERY Left     SOCIAL HISTORY: Social History   Socioeconomic History  . Marital status: Married    Spouse name: Not on file  . Number of children: Not on file  . Years of education: Not on file  . Highest education level: Not on file  Occupational History  . Not on file  Tobacco Use  . Smoking status: Former Smoker    Packs/day: 0.25    Years: 20.00    Pack years: 5.00    Quit date: 2015    Years since quitting: 6.4  .  Smokeless tobacco: Never Used  Vaping Use  . Vaping Use: Never used  Substance and Sexual Activity  . Alcohol use: Not Currently    Alcohol/week: 0.0 standard drinks    Comment: no alchol in 1 year  . Drug use: No  . Sexual activity: Not on file  Other Topics Concern  . Not on file  Social History Narrative  . Not on file   Social Determinants of Health   Financial Resource Strain:   . Difficulty of  Paying Living Expenses:   Food Insecurity:   . Worried About Charity fundraiser in the Last Year:   . Arboriculturist in the Last Year:   Transportation Needs:   . Film/video editor (Medical):   Marland Kitchen Lack of Transportation (Non-Medical):   Physical Activity:   . Days of Exercise per Week:   . Minutes of Exercise per Session:   Stress:   . Feeling of Stress :   Social Connections:   . Frequency of Communication with Friends and Family:   . Frequency of Social Gatherings with Friends and Family:   . Attends Religious Services:   . Active Member of Clubs or Organizations:   . Attends Archivist Meetings:   Marland Kitchen Marital Status:   Intimate Partner Violence:   . Fear of Current or Ex-Partner:   . Emotionally Abused:   Marland Kitchen Physically Abused:   . Sexually Abused:     FAMILY HISTORY: Family History  Problem Relation Age of Onset  . Diabetes Mother   . Cancer Mother        neuroendocrine cancer  . Cancer Father        neuroendocrine cancer of meninges  . Cancer Maternal Grandmother        tomach dx 49  . Alzheimer's disease Paternal Grandmother   . Lung cancer Paternal Grandfather   . Lung cancer Maternal Aunt   . Colon cancer Maternal Uncle   . Breast cancer Paternal Aunt        both dx 69s  . Ovarian cancer Other        dx 24  . Breast cancer Cousin     ALLERGIES:  is allergic to codeine and mucinex [guaifenesin er].  MEDICATIONS:  Current Outpatient Medications  Medication Sig Dispense Refill  . ACCU-CHEK AVIVA PLUS test strip CHECK BL00D SUGAR ONCE OR TWICE DAILY. (Patient not taking: No sig reported) 100 each PRN  . amLODipine (NORVASC) 10 MG tablet Take 10 mg by mouth daily.    . carvedilol (COREG) 25 MG tablet Take 1 tablet (25 mg total) by mouth 2 (two) times daily with a meal. 180 tablet 3  . Cholecalciferol (VITAMIN D3) 125 MCG (5000 UT) CAPS Take 5,000 Units by mouth daily.    Marland Kitchen glipiZIDE (GLUCOTROL) 5 MG tablet Take 1 tablet (5 mg total) by mouth  daily before breakfast. 90 tablet 3  . hydrochlorothiazide (HYDRODIURIL) 25 MG tablet TAKE 1 TABLET DAILY. (Patient taking differently: Take 25 mg by mouth daily. ) 90 tablet 0  . insulin lispro (INSULIN LISPRO) 100 UNIT/ML KwikPen Junior Inject into the skin 3 (three) times daily as needed (Above 250). Sliding scale    . losartan (COZAAR) 100 MG tablet Take 1 tablet by mouth daily. (Patient taking differently: Take 100 mg by mouth daily. ) 90 tablet 0  . lovastatin (MEVACOR) 40 MG tablet Take 1 tablet (40 mg total) by mouth at bedtime. 60 tablet 0  . metFORMIN (GLUCOPHAGE-XR)  500 MG 24 hr tablet Take 500 mg by mouth 2 (two) times daily.     . prochlorperazine (COMPAZINE) 10 MG tablet Take 1 tablet (10 mg total) by mouth every 6 (six) hours as needed for nausea or vomiting. 30 tablet 0  . vitamin B-12 (CYANOCOBALAMIN) 1000 MCG tablet Take 1 tablet (1,000 mcg total) by mouth daily. 90 tablet 0   No current facility-administered medications for this visit.     PHYSICAL EXAMINATION: ECOG PERFORMANCE STATUS: 1 - Symptomatic but completely ambulatory Vitals:   01/08/20 0830  BP: 112/70  Pulse: 71  Temp: (!) 96.1 F (35.6 C)  SpO2: 98%   Filed Weights   01/08/20 0830  Weight: 250 lb 12.8 oz (113.8 kg)    Physical Exam Constitutional:      General: He is not in acute distress.    Appearance: He is obese.  HENT:     Head: Normocephalic and atraumatic.  Eyes:     General: No scleral icterus. Cardiovascular:     Rate and Rhythm: Normal rate and regular rhythm.     Heart sounds: Normal heart sounds.  Pulmonary:     Effort: Pulmonary effort is normal. No respiratory distress.     Breath sounds: No wheezing.  Abdominal:     General: Bowel sounds are normal. There is no distension.     Palpations: Abdomen is soft.  Musculoskeletal:        General: No deformity. Normal range of motion.     Cervical back: Normal range of motion and neck supple.  Skin:    General: Skin is warm and  dry.     Findings: No erythema or rash.  Neurological:     Mental Status: He is alert and oriented to person, place, and time. Mental status is at baseline.     Cranial Nerves: No cranial nerve deficit.     Coordination: Coordination normal.  Psychiatric:        Mood and Affect: Mood normal.     LABORATORY DATA:  I have reviewed the data as listed Lab Results  Component Value Date   WBC 1.9 (L) 01/08/2020   HGB 9.6 (L) 01/08/2020   HCT 28.1 (L) 01/08/2020   MCV 91.2 01/08/2020   PLT 57 (L) 01/08/2020   Recent Labs    12/04/19 0838 12/18/19 0826 01/01/20 0807  NA 136 134* 134*  K 4.3 4.3 4.8  CL 106 104 104  CO2 21* 21* 22  GLUCOSE 79 120* 91  BUN 35* 36* 28*  CREATININE 2.04* 1.97* 1.80*  CALCIUM 8.8* 8.8* 9.0  GFRNONAA 35* 37* 41*  GFRAA 41* 43* 48*  PROT 8.0 7.8 7.8  ALBUMIN 4.1 4.1 4.1  AST 31 34 34  ALT 21 28 25   ALKPHOS 101 120 94  BILITOT 1.1 0.8 1.2   Iron/TIBC/Ferritin/ %Sat    Component Value Date/Time   IRON 52 12/18/2019 0826   IRON 105 10/15/2018 1325   TIBC 284 12/18/2019 0826   TIBC 244 (L) 10/15/2018 1325   FERRITIN 146 12/18/2019 0826   FERRITIN 588 (H) 10/15/2018 1325   IRONPCTSAT 18 12/18/2019 0826   IRONPCTSAT 43 10/15/2018 1325      RADIOGRAPHIC STUDIES: I have personally reviewed the radiological images as listed and agreed with the findings in the report. CT ABDOMEN PELVIS WO CONTRAST  Result Date: 10/29/2019 CLINICAL DATA:  Metastatic left renal cell carcinoma, prior nephrectomy. EXAM: CT CHEST, ABDOMEN AND PELVIS WITHOUT CONTRAST TECHNIQUE: Multidetector CT imaging of  the chest, abdomen and pelvis was performed following the standard protocol without IV contrast. COMPARISON:  Multiple exams, including 07/14/2019 FINDINGS: CT CHEST FINDINGS Cardiovascular: Coronary, aortic arch, and branch vessel atherosclerotic vascular disease. Mediastinum/Nodes: Right lower paratracheal node 0.9 cm in short axis on image 21/504, previously 1.8 cm  by my measurements. Small stable left internal mammary lymph nodes including a 0.6 cm lymph node on image 21/504. Lungs/Pleura: Marked improvement, with resolution of many of the pulmonary nodules, and prominently reduced size of other nodules. For example, a previously dominant 1.4 by 1.2 cm lingular nodule has completely resolved. A subpleural nodule in the right upper lobe on image 38/505 currently measures 0.4 cm in thickness on image 38/505, previously 0.8 cm in thickness. No new nodules identified. Musculoskeletal: Lower cervical plate and screw fixator. Lower thoracic spondylosis. CT ABDOMEN PELVIS FINDINGS Hepatobiliary: Nodular contour and morphology compatible with cirrhosis common no contour change from prior. Suspected gallbladder wall thickening. Pancreas: Unremarkable Spleen: Splenomegaly. The spleen measures 15.4 by 9.8 by 20.0 cm (volume = 1600 cm^3). Adrenals/Urinary Tract: Both adrenal glands appear normal. Unremarkable contour of the right kidney without findings of urinary tract calculi. Left nephrectomy. Stomach/Bowel: Mild sigmoid colon diverticulosis. Vascular/Lymphatic: Aortoiliac atherosclerotic vascular disease. Scattered small right gastric and peripancreatic lymph nodes not appreciably changed from prior. No overt pathologic adenopathy observed. Reproductive: Penile implant. Dystrophic calcifications along the prostate gland. Other: Stable mild mesenteric edema. Trace perisplenic ascites. Musculoskeletal: Unremarkable IMPRESSION: 1. Marked improvement, with resolution of many of the pulmonary nodules, and prominently reduced size of other nodules. Considerably reduced size of the right lower paratracheal lymph node, now 0.9 cm in diameter. 2. Hepatic cirrhosis with prominent splenomegaly and trace perisplenic ascites. 3. Other imaging findings of potential clinical significance: Coronary, aortic arch, and branch vessel atherosclerotic vascular disease. Mild sigmoid colon diverticulosis.  Aortic Atherosclerosis (ICD10-I70.0). Electronically Signed   By: Van Clines M.D.   On: 10/29/2019 13:27   CT CHEST WO CONTRAST  Result Date: 10/29/2019 CLINICAL DATA:  Metastatic left renal cell carcinoma, prior nephrectomy. EXAM: CT CHEST, ABDOMEN AND PELVIS WITHOUT CONTRAST TECHNIQUE: Multidetector CT imaging of the chest, abdomen and pelvis was performed following the standard protocol without IV contrast. COMPARISON:  Multiple exams, including 07/14/2019 FINDINGS: CT CHEST FINDINGS Cardiovascular: Coronary, aortic arch, and branch vessel atherosclerotic vascular disease. Mediastinum/Nodes: Right lower paratracheal node 0.9 cm in short axis on image 21/504, previously 1.8 cm by my measurements. Small stable left internal mammary lymph nodes including a 0.6 cm lymph node on image 21/504. Lungs/Pleura: Marked improvement, with resolution of many of the pulmonary nodules, and prominently reduced size of other nodules. For example, a previously dominant 1.4 by 1.2 cm lingular nodule has completely resolved. A subpleural nodule in the right upper lobe on image 38/505 currently measures 0.4 cm in thickness on image 38/505, previously 0.8 cm in thickness. No new nodules identified. Musculoskeletal: Lower cervical plate and screw fixator. Lower thoracic spondylosis. CT ABDOMEN PELVIS FINDINGS Hepatobiliary: Nodular contour and morphology compatible with cirrhosis common no contour change from prior. Suspected gallbladder wall thickening. Pancreas: Unremarkable Spleen: Splenomegaly. The spleen measures 15.4 by 9.8 by 20.0 cm (volume = 1600 cm^3). Adrenals/Urinary Tract: Both adrenal glands appear normal. Unremarkable contour of the right kidney without findings of urinary tract calculi. Left nephrectomy. Stomach/Bowel: Mild sigmoid colon diverticulosis. Vascular/Lymphatic: Aortoiliac atherosclerotic vascular disease. Scattered small right gastric and peripancreatic lymph nodes not appreciably changed from  prior. No overt pathologic adenopathy observed. Reproductive: Penile implant. Dystrophic calcifications along the  prostate gland. Other: Stable mild mesenteric edema. Trace perisplenic ascites. Musculoskeletal: Unremarkable IMPRESSION: 1. Marked improvement, with resolution of many of the pulmonary nodules, and prominently reduced size of other nodules. Considerably reduced size of the right lower paratracheal lymph node, now 0.9 cm in diameter. 2. Hepatic cirrhosis with prominent splenomegaly and trace perisplenic ascites. 3. Other imaging findings of potential clinical significance: Coronary, aortic arch, and branch vessel atherosclerotic vascular disease. Mild sigmoid colon diverticulosis. Aortic Atherosclerosis (ICD10-I70.0). Electronically Signed   By: Van Clines M.D.   On: 10/29/2019 13:27   PERIPHERAL VASCULAR CATHETERIZATION  Result Date: 11/11/2019 See op note    ASSESSMENT & PLAN:  1. Encounter for antineoplastic immunotherapy   2. Renal cell carcinoma, unspecified laterality ([place])   3. Chronic kidney disease, stage 3b   4. 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  Status post ipilimumab and nivolumab treatment. Labs are reviewed and discussed with patient. Proceed with Nivolumab today.   # Neutropenia, ANC has improved, at 1.1 today.  Proceed with immunotherapy.  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  This is considered to be secondary to chronic liver disease. Continue vitamin B12 supplementation daily.  Check flowcytometry  Chronic thrombocytopenia, stable. #Chronic kidney disease, he has made an appointment with Dr. Candiss Norse  and has an appointment in 2 weeks.  Continue to monitor  #Chronic liver cirrhosis with thrombocytopenia and neutropenia.   Platelet  count slightly decreased.   #Anemia secondary to chronic kidney disease, hemoglobin 9.6.   No iron deficiency.  #Slightly elevated TSH, and free T4 is normal., likely secondary to immunotherapy.   Continue to monitor. #Port-A-Cath in place, Follow-up 2 week lab MD for assessment evaluation prior to nivolumab. All questions were answered. The patient knows to call the clinic with any problems questions or concerns.   Earlie Server, MD, PhD Hematology Oncology [facility] at [facility] Pager- [phone] 01/08/2020

## 2020-01-22 ENCOUNTER — Inpatient Hospital Stay: Payer: BC Managed Care – PPO

## 2020-01-22 ENCOUNTER — Inpatient Hospital Stay (HOSPITAL_BASED_OUTPATIENT_CLINIC_OR_DEPARTMENT_OTHER): Payer: BC Managed Care – PPO | Admitting: Oncology

## 2020-01-22 ENCOUNTER — Encounter: Payer: Self-pay | Admitting: Oncology

## 2020-01-22 ENCOUNTER — Other Ambulatory Visit: Payer: Self-pay

## 2020-01-22 VITALS — BP 116/63 | HR 72 | Temp 98.0°F | Resp 20 | Wt 253.6 lb

## 2020-01-22 DIAGNOSIS — L0231 Cutaneous abscess of buttock: Secondary | ICD-10-CM

## 2020-01-22 DIAGNOSIS — Z5112 Encounter for antineoplastic immunotherapy: Secondary | ICD-10-CM

## 2020-01-22 DIAGNOSIS — C649 Malignant neoplasm of unspecified kidney, except renal pelvis: Secondary | ICD-10-CM

## 2020-01-22 DIAGNOSIS — D708 Other neutropenia: Secondary | ICD-10-CM

## 2020-01-22 DIAGNOSIS — N1832 Chronic kidney disease, stage 3b: Secondary | ICD-10-CM

## 2020-01-22 DIAGNOSIS — C642 Malignant neoplasm of left kidney, except renal pelvis: Secondary | ICD-10-CM | POA: Diagnosis not present

## 2020-01-22 DIAGNOSIS — D696 Thrombocytopenia, unspecified: Secondary | ICD-10-CM

## 2020-01-22 DIAGNOSIS — Z95828 Presence of other vascular implants and grafts: Secondary | ICD-10-CM

## 2020-01-22 LAB — COMPREHENSIVE METABOLIC PANEL
ALT: 25 U/L (ref 0–44)
AST: 30 U/L (ref 15–41)
Albumin: 3.9 g/dL (ref 3.5–5.0)
Alkaline Phosphatase: 107 U/L (ref 38–126)
Anion gap: 10 (ref 5–15)
BUN: 20 mg/dL (ref 6–20)
CO2: 23 mmol/L (ref 22–32)
Calcium: 8.6 mg/dL — ABNORMAL LOW (ref 8.9–10.3)
Chloride: 104 mmol/L (ref 98–111)
Creatinine, Ser: 1.74 mg/dL — ABNORMAL HIGH (ref 0.61–1.24)
GFR calc Af Amer: 50 mL/min — ABNORMAL LOW (ref >60)
GFR calc non Af Amer: 43 mL/min — ABNORMAL LOW (ref >60)
Glucose, Bld: 118 mg/dL — ABNORMAL HIGH (ref 70–99)
Potassium: 4.3 mmol/L (ref 3.5–5.1)
Sodium: 137 mmol/L (ref 135–145)
Total Bilirubin: 1 mg/dL (ref 0.3–1.2)
Total Protein: 7.7 g/dL (ref 6.5–8.1)

## 2020-01-22 LAB — CBC WITH DIFFERENTIAL/PLATELET
Abs Immature Granulocytes: 0.01 10*3/uL (ref 0.00–0.07)
Basophils Absolute: 0 10*3/uL (ref 0.0–0.1)
Basophils Relative: 1 %
Eosinophils Absolute: 0.1 10*3/uL (ref 0.0–0.5)
Eosinophils Relative: 5 %
HCT: 27.3 % — ABNORMAL LOW (ref 39.0–52.0)
Hemoglobin: 9.5 g/dL — ABNORMAL LOW (ref 13.0–17.0)
Immature Granulocytes: 0 %
Lymphocytes Relative: 19 %
Lymphs Abs: 0.5 10*3/uL — ABNORMAL LOW (ref 0.7–4.0)
MCH: 31.9 pg (ref 26.0–34.0)
MCHC: 34.8 g/dL (ref 30.0–36.0)
MCV: 91.6 fL (ref 80.0–100.0)
Monocytes Absolute: 0.3 10*3/uL (ref 0.1–1.0)
Monocytes Relative: 13 %
Neutro Abs: 1.5 10*3/uL — ABNORMAL LOW (ref 1.7–7.7)
Neutrophils Relative %: 62 %
Platelets: 66 10*3/uL — ABNORMAL LOW (ref 150–400)
RBC: 2.98 MIL/uL — ABNORMAL LOW (ref 4.22–5.81)
RDW: 13 % (ref 11.5–15.5)
WBC: 2.4 10*3/uL — ABNORMAL LOW (ref 4.0–10.5)
nRBC: 0 % (ref 0.0–0.2)

## 2020-01-22 LAB — TSH: TSH: 4.694 u[IU]/mL — ABNORMAL HIGH (ref 0.350–4.500)

## 2020-01-22 MED ORDER — HEPARIN SOD (PORK) LOCK FLUSH 100 UNIT/ML IV SOLN
500.0000 [IU] | Freq: Once | INTRAVENOUS | Status: AC | PRN
  Administered 2020-01-22: 500 [IU]
  Filled 2020-01-22: qty 5

## 2020-01-22 MED ORDER — HEPARIN SOD (PORK) LOCK FLUSH 100 UNIT/ML IV SOLN
INTRAVENOUS | Status: AC
  Filled 2020-01-22: qty 5

## 2020-01-22 MED ORDER — SODIUM CHLORIDE 0.9% FLUSH
10.0000 mL | INTRAVENOUS | Status: AC | PRN
  Administered 2020-01-22: 10 mL via INTRAVENOUS
  Filled 2020-01-22: qty 10

## 2020-01-22 MED ORDER — SODIUM CHLORIDE 0.9 % IV SOLN
240.0000 mg | Freq: Once | INTRAVENOUS | Status: AC
  Administered 2020-01-22: 240 mg via INTRAVENOUS
  Filled 2020-01-22: qty 24

## 2020-01-22 MED ORDER — SODIUM CHLORIDE 0.9 % IV SOLN
Freq: Once | INTRAVENOUS | Status: AC
  Filled 2020-01-22: qty 250

## 2020-01-22 NOTE — Progress Notes (Signed)
Hematology/Oncology follow-up [facility] Telephone:([phone] [phone]   Patient Care Team: Albina Billet, MD as PCP - General (Internal Medicine) Earlie Server, MD as Consulting Physician (Hematology and Oncology)  REFERRING PROVIDER: Albina Billet, MD  CHIEF COMPLAINTS/REASON FOR VISIT:  Follow-up for kidney cancer treatments  HISTORY OF PRESENTING ILLNESS:   Henry Moore is a  59 y.o.  male with PMH listed below was seen in consultation at the request of  Albina Billet, MD  for evaluation of kidney cancer Extensive medical records were reviewed Patient had MRI lumbar spine without contrast done on 07/13/2018 which showed mired lumbar degenerative disease. CT thoracic spine was done on 08/05/2018 which showed 5 cm left renal mass. 09/04/2018 CT abdomen and pelvis with and without contrast showed 5 cm round enhancing solid mass in the left kidney.  No involvement of left renal vein.  No lymphadenopathy Morphologic changes consistent with cirrhosis and the portal hypertension.  Small volume ascites and splenomegaly. . 10/16/2018 Left nephrectomy showed renal cell carcinoma, clear-cell type, nuclear grade 4, tumor extends into the renal vein and renal sinus fat.  Urethral, vascular and or resection margins are negative for tumor pT3a Nx Mx  01/28/2019 patient had another CT chest abdomen pelvis done with contrast Showed interval left nephrectomy.  Scattered tiny pulmonary nodules bilaterally.  Nonspecific.  No other evidence of metastatic disease.  Cirrhosis changes extensive coronary and aortic atherosclerosis  07/14/2019 patient underwent surveillance CT chest abdomen pelvis without contrast Interval progression of pulmonary metastasis with new enlarged right paratracheal lymph node 1.7 cm, previously 0.6 cm one-point since worrisome for metastatic adenopathy.  Multiple progressive pulmonary nodules, index nodule within the lingula measures 1.3 cm,  previously 3 mm. Index subpleural nodule within the anterior right upper lobe measuring 1.8 cm, previously 3 cm Index nodule within the post lateral right lower lobe measuring 5 mm, new from previous care Aortic sclerosis.  Three-vessel coronary artery calcification noted.  Cirrhosis changes.  Splenomegaly.  Patient was referred to establish care with medical oncology for further discussion and evaluation of metastatic renal cell carcinoma. Patient reports feeling well at baseline today.  Denies any shortness of breath, cough, hemoptysis, back pain, headache. He quitted smoking 2015.  Not currently actively drinking alcohol as well. Patient has chronic back pain unchanged.  # Liver cirrhosis with small amount of ascites/splenomegaly patient sees gastroenterology Dr. [name].  . He will get ultrasound surveillance due in February 2021 for screening of [place].  EGD surveillance for Barrett's plan in April 2021.  # Diabetes, on Humalog sliding scale and metformin.  #Family history of cancer and personal history of RCC.  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  Status post ipilimumab and nivolumab treatment.  # 10/29/2019 CT chest abdomen pelvis without contrast images were independently reviewed by me and discussed with patient.   CT showed marked improvement with resolution of many of the pulmonary nodules, prominent reduced size of other nodules.  Considerably reduced size of the right lower paratracheal lymph node now 0.9 cm in diameter. Hepatic cirrhosis with prominent splenomegaly and trace perisplenic ascites. Chronic CT findings includes aortic atherosclerotic vascular disease.  Mild sigmoid colon diverticulosis  INTERVAL HISTORY Henry Moore is a  59 y.o. male who has above  history reviewed by me today presents for follow up visit for management of stage IV RCC  Problems and complaints are listed below:   Patient establish care with Dr. Candiss Norse for chronic kidney disease.  Blood pressure medication has been adjusted.  Hydrochlorothiazide decreased to 12.5 mg daily. Patient reports having a skin abscess on his bottom for which he has been taking doxycycline.  He is going to primary care provider tomorrow for possible drainage.  Denies any fever, chills, diarrhea or other skin rash denies any breathing difficulties.  Chronic fatigue is at baseline.   Review of Systems  Constitutional: Positive for fatigue. Negative for appetite change, chills, fever and unexpected weight change.  HENT:   Negative for hearing loss and voice change.   Eyes: Negative for eye problems and icterus.  Respiratory: Negative for chest tightness, cough and shortness of breath.   Cardiovascular: Negative for chest pain and leg swelling.  Gastrointestinal: Negative for abdominal distention and abdominal pain.  Endocrine: Negative for hot flashes.  Genitourinary: Negative for difficulty urinating, dysuria and frequency.   Musculoskeletal: Negative for arthralgias.       Chronic back pain  Skin: Negative for itching and rash.       Skin abscess on his bottom  Neurological: Negative for light-headedness and numbness.  Hematological: Negative for adenopathy. Does not bruise/bleed easily.  Psychiatric/Behavioral: Negative for confusion.    MEDICAL HISTORY:  Past Medical History:  Diagnosis Date  . Cough    SINUS DRAINAGE CAUSING COUGHING , REPORTS CLEAR MUCOUS   . Diabetes mellitus without complication ([place])   . Family history of breast cancer   . Family history of colon cancer   . Family history of stomach cancer   . HTN (hypertension)   . Hyperlipemia   . Left renal mass   . Platelets decreased ([place])    DENIES UNSUAL BLEEDING   . PONV (postoperative nausea  and vomiting)   . Renal cell carcinoma ([place]) 08/04/2019  . WBC decreased     SURGICAL HISTORY: Past Surgical History:  Procedure Laterality Date  . ANKLE SURGERY Left   . ANTERIOR CERVICAL DECOMP/DISCECTOMY FUSION N/A 04/16/2014   Procedure: ANTERIOR CERVICAL DECOMPRESSION/DISCECTOMY FUSION  (ACDF C5-C7)   (2 LEVELS)     ;  Surgeon: Melina Schools, MD;  Location: [facility];  Service: Orthopedics;  Laterality: N/A;  . APPENDECTOMY    . BACK SURGERY    . ESOPHAGOGASTRODUODENOSCOPY (EGD) WITH PROPOFOL N/A 02/06/2019   Procedure: ESOPHAGOGASTRODUODENOSCOPY (EGD) WITH PROPOFOL;  Surgeon: [name], MD;  Location: [facility] ENDOSCOPY;  Service: Gastroenterology;  Laterality: N/A;  . ESOPHAGOGASTRODUODENOSCOPY (EGD) WITH PROPOFOL N/A 03/04/2019   Procedure: ESOPHAGOGASTRODUODENOSCOPY (EGD) WITH PROPOFOL;  Surgeon: Lin Landsman, MD;  Location: [facility] ENDOSCOPY;  Service: Gastroenterology;  Laterality: N/A;  . ESOPHAGOGASTRODUODENOSCOPY (EGD) WITH PROPOFOL N/A 04/22/2019   Procedure: ESOPHAGOGASTRODUODENOSCOPY (EGD) WITH PROPOFOL;  Surgeon: [name], MD;  Location: [facility] ENDOSCOPY;  Service: Gastroenterology;  Laterality: N/A;  . FRACTURE SURGERY    . HYDROCELE EXCISION    . KNEE ARTHROSCOPY Bilateral   . LAPAROSCOPIC NEPHRECTOMY, HAND ASSISTED Left 10/16/2018   Procedure: LEFT HAND ASSISTED LAPAROSCOPIC NEPHRECTOMY;  Surgeon: Lucas Mallow, MD;  Location: [facility] ORS;  Service: Urology;  Laterality: Left;  . NASAL SINUS SURGERY     X 2  . 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 PORTA CATH INSERTION N/A 11/11/2019   Procedure: PORTA CATH INSERTION;  Surgeon: Katha Cabal, MD;  Location: [facility] INVASIVE CV  LAB;  Service: Cardiovascular;  Laterality: N/A;  . SHOULDER SURGERY Left     SOCIAL HISTORY: Social History   Socioeconomic History  . Marital status: Married    Spouse name: Not on file  . Number of children: Not on file  . Years of education: Not on file  . Highest education level: Not  on file  Occupational History  . Not on file  Tobacco Use  . Smoking status: Former Smoker    Packs/day: 0.25    Years: 20.00    Pack years: 5.00    Quit date: 2015    Years since quitting: 6.4  . Smokeless tobacco: Never Used  Vaping Use  . Vaping Use: Never used  Substance and Sexual Activity  . Alcohol use: Not Currently    Alcohol/week: 0.0 standard drinks    Comment: no alchol in 1 year  . Drug use: No  . Sexual activity: Not on file  Other Topics Concern  . Not on file  Social History Narrative  . Not on file   Social Determinants of Health   Financial Resource Strain:   . Difficulty of Paying Living Expenses:   Food Insecurity:   . Worried About Charity fundraiser in the Last Year:   . Arboriculturist in the Last Year:   Transportation Needs:   . Film/video editor (Medical):   Marland Kitchen Lack of Transportation (Non-Medical):   Physical Activity:   . Days of Exercise per Week:   . Minutes of Exercise per Session:   Stress:   . Feeling of Stress :   Social Connections:   . Frequency of Communication with Friends and Family:   . Frequency of Social Gatherings with Friends and Family:   . Attends Religious Services:   . Active Member of Clubs or Organizations:   . Attends Archivist Meetings:   Marland Kitchen Marital Status:   Intimate Partner Violence:   . Fear of Current or Ex-Partner:   . Emotionally Abused:   Marland Kitchen Physically Abused:   . Sexually Abused:     FAMILY HISTORY: Family History  Problem Relation Age of Onset  . Diabetes Mother   . Cancer Mother        neuroendocrine cancer  . Cancer Father        neuroendocrine cancer of meninges  . Cancer Maternal Grandmother        tomach dx 39  . Alzheimer's disease Paternal Grandmother   . Lung cancer Paternal Grandfather   . Lung cancer Maternal Aunt   . Colon cancer Maternal Uncle   . Breast cancer Paternal Aunt        both dx 38s  . Ovarian cancer Other        dx 45  . Breast cancer Cousin      ALLERGIES:  is allergic to codeine and mucinex [guaifenesin er].  MEDICATIONS:  Current Outpatient Medications  Medication Sig Dispense Refill  . ACCU-CHEK AVIVA PLUS test strip CHECK BL00D SUGAR ONCE OR TWICE DAILY. 100 each PRN  . amLODipine (NORVASC) 10 MG tablet Take 10 mg by mouth daily.    . carvedilol (COREG) 25 MG tablet Take 1 tablet (25 mg total) by mouth 2 (two) times daily with a meal. 180 tablet 3  . Cholecalciferol (VITAMIN D3) 125 MCG (5000 UT) CAPS Take 5,000 Units by mouth daily.    Marland Kitchen glipiZIDE (GLUCOTROL) 5 MG tablet Take 1 tablet (5 mg total) by mouth daily before breakfast.  90 tablet 3  . hydrochlorothiazide (HYDRODIURIL) 25 MG tablet TAKE 1 TABLET DAILY. (Patient taking differently: Take 25 mg by mouth daily. ) 90 tablet 0  . insulin lispro (INSULIN LISPRO) 100 UNIT/ML KwikPen Junior Inject into the skin 3 (three) times daily as needed (Above 250). Sliding scale    . losartan (COZAAR) 100 MG tablet Take 1 tablet by mouth daily. (Patient taking differently: Take 100 mg by mouth daily. ) 90 tablet 0  . lovastatin (MEVACOR) 40 MG tablet Take 1 tablet (40 mg total) by mouth at bedtime. 60 tablet 0  . metFORMIN (GLUCOPHAGE-XR) 500 MG 24 hr tablet Take 500 mg by mouth 2 (two) times daily.     . prochlorperazine (COMPAZINE) 10 MG tablet Take 1 tablet (10 mg total) by mouth every 6 (six) hours as needed for nausea or vomiting. 30 tablet 0  . vitamin B-12 (CYANOCOBALAMIN) 1000 MCG tablet Take 1 tablet (1,000 mcg total) by mouth daily. 90 tablet 0   No current facility-administered medications for this visit.     PHYSICAL EXAMINATION: ECOG PERFORMANCE STATUS: 1 - Symptomatic but completely ambulatory Vitals:   01/22/20 0845  BP: 116/63  Pulse: 72  Resp: 20  Temp: 98 F (36.7 C)  SpO2: 100%   Filed Weights   01/22/20 0845  Weight: 253 lb 9.6 oz (115 kg)    Physical Exam Constitutional:      General: He is not in acute distress.    Appearance: He is obese.   HENT:     Head: Normocephalic and atraumatic.  Eyes:     General: No scleral icterus. Cardiovascular:     Rate and Rhythm: Normal rate and regular rhythm.     Heart sounds: Normal heart sounds.  Pulmonary:     Effort: Pulmonary effort is normal. No respiratory distress.     Breath sounds: No wheezing.  Abdominal:     General: Bowel sounds are normal. There is no distension.     Palpations: Abdomen is soft.  Musculoskeletal:        General: No deformity. Normal range of motion.     Cervical back: Normal range of motion and neck supple.  Skin:    General: Skin is warm and dry.     Findings: No erythema or rash.  Neurological:     Mental Status: He is alert and oriented to person, place, and time. Mental status is at baseline.     Cranial Nerves: No cranial nerve deficit.     Coordination: Coordination normal.  Psychiatric:        Mood and Affect: Mood normal.     LABORATORY DATA:  I have reviewed the data as listed Lab Results  Component Value Date   WBC 1.9 (L) 01/08/2020   HGB 9.6 (L) 01/08/2020   HCT 28.1 (L) 01/08/2020   MCV 91.2 01/08/2020   PLT 57 (L) 01/08/2020   Recent Labs    12/18/19 0826 01/01/20 0807 01/08/20 0810  NA 134* 134* 136  K 4.3 4.8 4.6  CL 104 104 106  CO2 21* 22 22  GLUCOSE 120* 91 114*  BUN 36* 28* 25*  CREATININE 1.97* 1.80* 1.74*  CALCIUM 8.8* 9.0 8.8*  GFRNONAA 37* 41* 43*  GFRAA 43* 48* 50*  PROT 7.8 7.8 7.9  ALBUMIN 4.1 4.1 4.1  AST 34 34 35  ALT 28 25 29   ALKPHOS 120 94 108  BILITOT 0.8 1.2 0.9   Iron/TIBC/Ferritin/ %Sat    Component Value Date/Time  IRON 52 12/18/2019 0826   IRON 105 10/15/2018 1325   TIBC 284 12/18/2019 0826   TIBC 244 (L) 10/15/2018 1325   FERRITIN 146 12/18/2019 0826   FERRITIN 588 (H) 10/15/2018 1325   IRONPCTSAT 18 12/18/2019 0826   IRONPCTSAT 43 10/15/2018 1325      RADIOGRAPHIC STUDIES: I have personally reviewed the radiological images as listed and agreed with the findings in the  report. CT ABDOMEN PELVIS WO CONTRAST  Result Date: 10/29/2019 CLINICAL DATA:  Metastatic left renal cell carcinoma, prior nephrectomy. EXAM: CT CHEST, ABDOMEN AND PELVIS WITHOUT CONTRAST TECHNIQUE: Multidetector CT imaging of the chest, abdomen and pelvis was performed following the standard protocol without IV contrast. COMPARISON:  Multiple exams, including 07/14/2019 FINDINGS: CT CHEST FINDINGS Cardiovascular: Coronary, aortic arch, and branch vessel atherosclerotic vascular disease. Mediastinum/Nodes: Right lower paratracheal node 0.9 cm in short axis on image 21/504, previously 1.8 cm by my measurements. Small stable left internal mammary lymph nodes including a 0.6 cm lymph node on image 21/504. Lungs/Pleura: Marked improvement, with resolution of many of the pulmonary nodules, and prominently reduced size of other nodules. For example, a previously dominant 1.4 by 1.2 cm lingular nodule has completely resolved. A subpleural nodule in the right upper lobe on image 38/505 currently measures 0.4 cm in thickness on image 38/505, previously 0.8 cm in thickness. No new nodules identified. Musculoskeletal: Lower cervical plate and screw fixator. Lower thoracic spondylosis. CT ABDOMEN PELVIS FINDINGS Hepatobiliary: Nodular contour and morphology compatible with cirrhosis common no contour change from prior. Suspected gallbladder wall thickening. Pancreas: Unremarkable Spleen: Splenomegaly. The spleen measures 15.4 by 9.8 by 20.0 cm (volume = 1600 cm^3). Adrenals/Urinary Tract: Both adrenal glands appear normal. Unremarkable contour of the right kidney without findings of urinary tract calculi. Left nephrectomy. Stomach/Bowel: Mild sigmoid colon diverticulosis. Vascular/Lymphatic: Aortoiliac atherosclerotic vascular disease. Scattered small right gastric and peripancreatic lymph nodes not appreciably changed from prior. No overt pathologic adenopathy observed. Reproductive: Penile implant. Dystrophic  calcifications along the prostate gland. Other: Stable mild mesenteric edema. Trace perisplenic ascites. Musculoskeletal: Unremarkable IMPRESSION: 1. Marked improvement, with resolution of many of the pulmonary nodules, and prominently reduced size of other nodules. Considerably reduced size of the right lower paratracheal lymph node, now 0.9 cm in diameter. 2. Hepatic cirrhosis with prominent splenomegaly and trace perisplenic ascites. 3. Other imaging findings of potential clinical significance: Coronary, aortic arch, and branch vessel atherosclerotic vascular disease. Mild sigmoid colon diverticulosis. Aortic Atherosclerosis (ICD10-I70.0). Electronically Signed   By: Van Clines M.D.   On: 10/29/2019 13:27   CT CHEST WO CONTRAST  Result Date: 10/29/2019 CLINICAL DATA:  Metastatic left renal cell carcinoma, prior nephrectomy. EXAM: CT CHEST, ABDOMEN AND PELVIS WITHOUT CONTRAST TECHNIQUE: Multidetector CT imaging of the chest, abdomen and pelvis was performed following the standard protocol without IV contrast. COMPARISON:  Multiple exams, including 07/14/2019 FINDINGS: CT CHEST FINDINGS Cardiovascular: Coronary, aortic arch, and branch vessel atherosclerotic vascular disease. Mediastinum/Nodes: Right lower paratracheal node 0.9 cm in short axis on image 21/504, previously 1.8 cm by my measurements. Small stable left internal mammary lymph nodes including a 0.6 cm lymph node on image 21/504. Lungs/Pleura: Marked improvement, with resolution of many of the pulmonary nodules, and prominently reduced size of other nodules. For example, a previously dominant 1.4 by 1.2 cm lingular nodule has completely resolved. A subpleural nodule in the right upper lobe on image 38/505 currently measures 0.4 cm in thickness on image 38/505, previously 0.8 cm in thickness. No new nodules identified. Musculoskeletal: Lower  cervical plate and screw fixator. Lower thoracic spondylosis. CT ABDOMEN PELVIS FINDINGS Hepatobiliary:  Nodular contour and morphology compatible with cirrhosis common no contour change from prior. Suspected gallbladder wall thickening. Pancreas: Unremarkable Spleen: Splenomegaly. The spleen measures 15.4 by 9.8 by 20.0 cm (volume = 1600 cm^3). Adrenals/Urinary Tract: Both adrenal glands appear normal. Unremarkable contour of the right kidney without findings of urinary tract calculi. Left nephrectomy. Stomach/Bowel: Mild sigmoid colon diverticulosis. Vascular/Lymphatic: Aortoiliac atherosclerotic vascular disease. Scattered small right gastric and peripancreatic lymph nodes not appreciably changed from prior. No overt pathologic adenopathy observed. Reproductive: Penile implant. Dystrophic calcifications along the prostate gland. Other: Stable mild mesenteric edema. Trace perisplenic ascites. Musculoskeletal: Unremarkable IMPRESSION: 1. Marked improvement, with resolution of many of the pulmonary nodules, and prominently reduced size of other nodules. Considerably reduced size of the right lower paratracheal lymph node, now 0.9 cm in diameter. 2. Hepatic cirrhosis with prominent splenomegaly and trace perisplenic ascites. 3. Other imaging findings of potential clinical significance: Coronary, aortic arch, and branch vessel atherosclerotic vascular disease. Mild sigmoid colon diverticulosis. Aortic Atherosclerosis (ICD10-I70.0). Electronically Signed   By: Van Clines M.D.   On: 10/29/2019 13:27   PERIPHERAL VASCULAR CATHETERIZATION  Result Date: 11/11/2019 See op note    ASSESSMENT & PLAN:  1. Renal cell carcinoma, unspecified laterality ([place])   2. Encounter for antineoplastic immunotherapy   3. Other neutropenia ([place])   4. Thrombocytopenia ([place])   5. 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 Proceed with nivolumab today.  #Skin abscess, continue and finish course of antibiotics.  Follow-up with primary  care provider for evaluation of need for drainage. #Neutropenia, ANC has improved to 1.5.  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 Continue vitamin B12 supplementation daily. Check flowcytometry  Chronic thrombocytopenia, platelet counts has been stable.  Continue to monitor.  Thrombocytopenia secondary to cirrhosis. #Chronic kidney disease, patient was seen by Dr. Candiss Norse on 12/30/2019 Hydrochlorothiazide dose has been reduced.  Avoid nephrotoxins.  #Chronic liver cirrhosis with thrombocytopenia and neutropenia.   Platelet count slightly decreased.   #Anemia secondary to chronic kidney disease, hemoglobin 9.5 stable. No iron deficiency. #Slightly elevated TSH, and free T4 is normal., likely secondary to immunotherapy.   Continue to monitor. #Port-A-Cath in place, Follow-up 2 week lab MD for assessment evaluation prior to nivolumab. All questions were answered. The patient knows to call the clinic with any problems questions or concerns.   Earlie Server, MD, PhD Hematology Oncology [facility] Pager- [phone] 01/22/2020

## 2020-01-26 ENCOUNTER — Other Ambulatory Visit: Payer: Self-pay

## 2020-01-26 ENCOUNTER — Ambulatory Visit (INDEPENDENT_AMBULATORY_CARE_PROVIDER_SITE_OTHER): Payer: BC Managed Care – PPO | Admitting: Dermatology

## 2020-01-26 DIAGNOSIS — R52 Pain, unspecified: Secondary | ICD-10-CM

## 2020-01-26 DIAGNOSIS — L0231 Cutaneous abscess of buttock: Secondary | ICD-10-CM | POA: Diagnosis not present

## 2020-01-26 DIAGNOSIS — L720 Epidermal cyst: Secondary | ICD-10-CM | POA: Diagnosis not present

## 2020-01-26 DIAGNOSIS — L03317 Cellulitis of buttock: Secondary | ICD-10-CM

## 2020-01-26 LAB — COMP PANEL: LEUKEMIA/LYMPHOMA

## 2020-01-26 MED ORDER — DOXYCYCLINE HYCLATE 100 MG PO CAPS: 100.0000 mg | ORAL_CAPSULE | Freq: Two times a day (BID) | ORAL | 0 refills | Status: AC

## 2020-01-26 NOTE — Patient Instructions (Signed)
Wound Care Instructions  1. Cleanse wound gently with soap and water once a day then pat dry with clean gauze. Apply a thing coat of Petrolatum (petroleum jelly, "Vaseline") over the wound (unless you have an allergy to this). We recommend that you use a new, sterile tube of Vaseline. Do not pick or remove scabs. Do not remove the yellow or white "healing tissue" from the base of the wound.  2. Cover the wound with fresh, clean, nonstick gauze and secure with paper tape. You may use Band-Aids in place of gauze and tape if the would is small enough, but would recommend trimming much of the tape off as there is often too much. Sometimes Band-Aids can irritate the skin.  3. You should call the office for your biopsy report after 1 week if you have not already been contacted.  4. If you experience any problems, such as abnormal amounts of bleeding, swelling, significant bruising, significant pain, or evidence of infection, please call the office immediately.  5. FOR ADULT SURGERY PATIENTS: If you need something for pain relief you may take 1 extra strength Tylenol (acetaminophen) AND 2 Ibuprofen (200mg  each) together every 4 hours as needed for pain. (do not take these if you are allergic to them or if you have a reason you should not take them.) Typically, you may only need pain medication for 1 to 3 days.     Skin Abscess  A skin abscess is an infected area on or under your skin that contains a collection of pus and other material. An abscess may also be called a furuncle, carbuncle, or boil. An abscess can occur in or on almost any part of your body. Some abscesses break open (rupture) on their own. Most continue to get worse unless they are treated. The infection can spread deeper into the body and eventually into your blood, which can make you feel ill. Treatment usually involves draining the abscess. What are the causes? An abscess occurs when germs, like bacteria, pass through your skin and  cause an infection. This may be caused by:  A scrape or cut on your skin.  A puncture wound through your skin, including a needle injection or insect bite.  Blocked oil or sweat glands.  Blocked and infected hair follicles.  A cyst that forms beneath your skin (sebaceous cyst) and becomes infected. What increases the risk? This condition is more likely to develop in people who:  Have a weak body defense system (immune system).  Have diabetes.  Have dry and irritated skin.  Get frequent injections or use illegal IV drugs.  Have a foreign body in a wound, such as a splinter.  Have problems with their lymph system or veins. What are the signs or symptoms? Symptoms of this condition include:  A painful, firm bump under the skin.  A bump with pus at the top. This may break through the skin and drain. Other symptoms include:  Redness surrounding the abscess site.  Warmth.  Swelling of the lymph nodes (glands) near the abscess.  Tenderness.  A sore on the skin. How is this diagnosed? This condition may be diagnosed based on:  A physical exam.  Your medical history.  A sample of pus. This may be used to find out what is causing the infection.  Blood tests.  Imaging tests, such as an ultrasound, CT scan, or MRI. How is this treated? A small abscess that drains on its own may not need treatment. Treatment for larger  abscesses may include:  Moist heat or heat pack applied to the area several times a day.  A procedure to drain the abscess (incision and drainage).  Antibiotic medicines. For a severe abscess, you may first get antibiotics through an IV and then change to antibiotics by mouth. Follow these instructions at home: Medicines   Take over-the-counter and prescription medicines only as told by your health care provider.  If you were prescribed an antibiotic medicine, take it as told by your health care provider. Do not stop taking the antibiotic even if  you start to feel better. Abscess care   If you have an abscess that has not drained, apply heat to the affected area. Use the heat source that your health care provider recommends, such as a moist heat pack or a heating pad. ? Place a towel between your skin and the heat source. ? Leave the heat on for 20-30 minutes. ? Remove the heat if your skin turns bright red. This is especially important if you are unable to feel pain, heat, or cold. You may have a greater risk of getting burned.  Follow instructions from your health care provider about how to take care of your abscess. Make sure you: ? Cover the abscess with a bandage (dressing). ? Change your dressing or gauze as told by your health care provider. ? Wash your hands with soap and water before you change the dressing or gauze. If soap and water are not available, use hand sanitizer.  Check your abscess every day for signs of a worsening infection. Check for: ? More redness, swelling, or pain. ? More fluid or blood. ? Warmth. ? More pus or a bad smell. General instructions  To avoid spreading the infection: ? Do not share personal care items, towels, or hot tubs with others. ? Avoid making skin contact with other people.  Keep all follow-up visits as told by your health care provider. This is important. Contact a health care provider if you have:  More redness, swelling, or pain around your abscess.  More fluid or blood coming from your abscess.  Warm skin around your abscess.  More pus or a bad smell coming from your abscess.  A fever.  Muscle aches.  Chills or a general ill feeling. Get help right away if you:  Have severe pain.  See red streaks on your skin spreading away from the abscess. Summary  A skin abscess is an infected area on or under your skin that contains a collection of pus and other material.  A small abscess that drains on its own may not need treatment.  Treatment for larger abscesses may  include having a procedure to drain the abscess and taking an antibiotic. This information is not intended to replace advice given to you by your health care provider. Make sure you discuss any questions you have with your health care provider. Document Revised: 11/07/2018 Document Reviewed: 08/30/2017 Elsevier Patient Education  2020 [employer].

## 2020-01-26 NOTE — Progress Notes (Signed)
   Follow-Up Visit   Subjective  Henry Moore is a 59 y.o. male who presents for the following: Cyst.  Patient here today for a cyst on buttocks. Started getting sore about 5 days ago. Has not drained.  The following portions of the chart were reviewed this encounter and updated as appropriate:  Tobacco  Allergies  Meds  Problems  Med Hx  Surg Hx  Fam Hx      Review of Systems:  No other skin or systemic complaints except as noted in HPI or Assessment and Plan.  Objective  Well appearing patient in no apparent distress; mood and affect are within normal limits.  A focused examination was performed including buttocks. Relevant physical exam findings are noted in the Assessment and Plan.  Objective  Gluteal Crease: Subcutaneous papule/nodule with erythema and edema, tender to touch.    Assessment & Plan    Abscess - possibly originating from ruptured Epidermal inclusion cyst - with associated pain and cellulitis Gluteal Crease  Incision and Drainage - Gluteal Crease Location: Perianal area  Informed Consent: Discussed risks (permanent scarring, light or dark discoloration, infection, pain, bleeding, bruising, redness, damage to adjacent structures, and recurrence of the lesion) and benefits of the procedure, as well as the alternatives.  Informed consent was obtained.  Preparation: The area was prepped with alcohol.  Anesthesia: Lidocaine 1% with epinephrine  Procedure Details: An incision was made overlying the lesion. The lesion drained pus and white, chalky cyst material.  A large amount of fluid was drained.    Antibiotic ointment and a sterile pressure dressing were applied. The patient tolerated procedure well.  Total number of lesions drained: 1  Plan: The patient was instructed on post-op care. Recommend OTC analgesia as needed for pain.   doxycycline (VIBRAMYCIN) 100 MG capsule - Gluteal Crease  Return if symptoms worsen or fail to improve.  Graciella Belton, RMA, am acting as scribe for Sarina Ser, MD .  Documentation: I have reviewed the above documentation for accuracy and completeness, and I agree with the above.  Sarina Ser, MD

## 2020-01-29 ENCOUNTER — Encounter: Payer: Self-pay | Admitting: Dermatology

## 2020-02-05 ENCOUNTER — Inpatient Hospital Stay: Payer: Medicare Other

## 2020-02-05 ENCOUNTER — Inpatient Hospital Stay (HOSPITAL_BASED_OUTPATIENT_CLINIC_OR_DEPARTMENT_OTHER): Payer: Medicare Other | Admitting: Oncology

## 2020-02-05 ENCOUNTER — Other Ambulatory Visit: Payer: Self-pay

## 2020-02-05 ENCOUNTER — Encounter: Payer: Self-pay | Admitting: Oncology

## 2020-02-05 ENCOUNTER — Inpatient Hospital Stay: Payer: Medicare Other | Attending: Oncology

## 2020-02-05 VITALS — BP 122/72 | HR 71 | Temp 96.1°F | Resp 18 | Wt 253.1 lb

## 2020-02-05 DIAGNOSIS — R7989 Other specified abnormal findings of blood chemistry: Secondary | ICD-10-CM | POA: Diagnosis not present

## 2020-02-05 DIAGNOSIS — Z87891 Personal history of nicotine dependence: Secondary | ICD-10-CM | POA: Insufficient documentation

## 2020-02-05 DIAGNOSIS — D72819 Decreased white blood cell count, unspecified: Secondary | ICD-10-CM | POA: Diagnosis not present

## 2020-02-05 DIAGNOSIS — Z794 Long term (current) use of insulin: Secondary | ICD-10-CM | POA: Diagnosis not present

## 2020-02-05 DIAGNOSIS — Z801 Family history of malignant neoplasm of trachea, bronchus and lung: Secondary | ICD-10-CM | POA: Diagnosis not present

## 2020-02-05 DIAGNOSIS — Z95828 Presence of other vascular implants and grafts: Secondary | ICD-10-CM

## 2020-02-05 DIAGNOSIS — D631 Anemia in chronic kidney disease: Secondary | ICD-10-CM | POA: Diagnosis not present

## 2020-02-05 DIAGNOSIS — Z800 Family history of malignant neoplasm of digestive organs: Secondary | ICD-10-CM | POA: Diagnosis not present

## 2020-02-05 DIAGNOSIS — I129 Hypertensive chronic kidney disease with stage 1 through stage 4 chronic kidney disease, or unspecified chronic kidney disease: Secondary | ICD-10-CM | POA: Insufficient documentation

## 2020-02-05 DIAGNOSIS — N189 Chronic kidney disease, unspecified: Secondary | ICD-10-CM | POA: Diagnosis not present

## 2020-02-05 DIAGNOSIS — R188 Other ascites: Secondary | ICD-10-CM | POA: Diagnosis not present

## 2020-02-05 DIAGNOSIS — R946 Abnormal results of thyroid function studies: Secondary | ICD-10-CM | POA: Diagnosis not present

## 2020-02-05 DIAGNOSIS — E785 Hyperlipidemia, unspecified: Secondary | ICD-10-CM | POA: Insufficient documentation

## 2020-02-05 DIAGNOSIS — D6959 Other secondary thrombocytopenia: Secondary | ICD-10-CM | POA: Diagnosis not present

## 2020-02-05 DIAGNOSIS — D696 Thrombocytopenia, unspecified: Secondary | ICD-10-CM

## 2020-02-05 DIAGNOSIS — Z79899 Other long term (current) drug therapy: Secondary | ICD-10-CM | POA: Insufficient documentation

## 2020-02-05 DIAGNOSIS — C649 Malignant neoplasm of unspecified kidney, except renal pelvis: Secondary | ICD-10-CM

## 2020-02-05 DIAGNOSIS — E1122 Type 2 diabetes mellitus with diabetic chronic kidney disease: Secondary | ICD-10-CM | POA: Diagnosis not present

## 2020-02-05 DIAGNOSIS — I2510 Atherosclerotic heart disease of native coronary artery without angina pectoris: Secondary | ICD-10-CM | POA: Diagnosis not present

## 2020-02-05 DIAGNOSIS — Z803 Family history of malignant neoplasm of breast: Secondary | ICD-10-CM | POA: Insufficient documentation

## 2020-02-05 DIAGNOSIS — K7460 Unspecified cirrhosis of liver: Secondary | ICD-10-CM | POA: Insufficient documentation

## 2020-02-05 DIAGNOSIS — C7800 Secondary malignant neoplasm of unspecified lung: Secondary | ICD-10-CM | POA: Insufficient documentation

## 2020-02-05 DIAGNOSIS — Z5112 Encounter for antineoplastic immunotherapy: Secondary | ICD-10-CM | POA: Diagnosis present

## 2020-02-05 DIAGNOSIS — M549 Dorsalgia, unspecified: Secondary | ICD-10-CM | POA: Insufficient documentation

## 2020-02-05 DIAGNOSIS — R161 Splenomegaly, not elsewhere classified: Secondary | ICD-10-CM | POA: Insufficient documentation

## 2020-02-05 DIAGNOSIS — G8929 Other chronic pain: Secondary | ICD-10-CM | POA: Diagnosis not present

## 2020-02-05 DIAGNOSIS — C642 Malignant neoplasm of left kidney, except renal pelvis: Secondary | ICD-10-CM | POA: Diagnosis not present

## 2020-02-05 LAB — COMPREHENSIVE METABOLIC PANEL
ALT: 19 U/L (ref 0–44)
AST: 27 U/L (ref 15–41)
Albumin: 4 g/dL (ref 3.5–5.0)
Alkaline Phosphatase: 96 U/L (ref 38–126)
Anion gap: 9 (ref 5–15)
BUN: 25 mg/dL — ABNORMAL HIGH (ref 6–20)
CO2: 22 mmol/L (ref 22–32)
Calcium: 8.4 mg/dL — ABNORMAL LOW (ref 8.9–10.3)
Chloride: 106 mmol/L (ref 98–111)
Creatinine, Ser: 1.78 mg/dL — ABNORMAL HIGH (ref 0.61–1.24)
GFR calc Af Amer: 48 mL/min — ABNORMAL LOW (ref >60)
GFR calc non Af Amer: 42 mL/min — ABNORMAL LOW (ref >60)
Glucose, Bld: 129 mg/dL — ABNORMAL HIGH (ref 70–99)
Potassium: 4.2 mmol/L (ref 3.5–5.1)
Sodium: 137 mmol/L (ref 135–145)
Total Bilirubin: 0.9 mg/dL (ref 0.3–1.2)
Total Protein: 7.6 g/dL (ref 6.5–8.1)

## 2020-02-05 LAB — CBC WITH DIFFERENTIAL/PLATELET
Abs Immature Granulocytes: 0.01 10*3/uL (ref 0.00–0.07)
Basophils Absolute: 0 10*3/uL (ref 0.0–0.1)
Basophils Relative: 1 %
Eosinophils Absolute: 0.1 10*3/uL (ref 0.0–0.5)
Eosinophils Relative: 4 %
HCT: 27.8 % — ABNORMAL LOW (ref 39.0–52.0)
Hemoglobin: 9.7 g/dL — ABNORMAL LOW (ref 13.0–17.0)
Immature Granulocytes: 0 %
Lymphocytes Relative: 18 %
Lymphs Abs: 0.4 10*3/uL — ABNORMAL LOW (ref 0.7–4.0)
MCH: 31.5 pg (ref 26.0–34.0)
MCHC: 34.9 g/dL (ref 30.0–36.0)
MCV: 90.3 fL (ref 80.0–100.0)
Monocytes Absolute: 0.3 10*3/uL (ref 0.1–1.0)
Monocytes Relative: 11 %
Neutro Abs: 1.5 10*3/uL — ABNORMAL LOW (ref 1.7–7.7)
Neutrophils Relative %: 66 %
Platelets: 60 10*3/uL — ABNORMAL LOW (ref 150–400)
RBC: 3.08 MIL/uL — ABNORMAL LOW (ref 4.22–5.81)
RDW: 12.9 % (ref 11.5–15.5)
WBC: 2.3 10*3/uL — ABNORMAL LOW (ref 4.0–10.5)
nRBC: 0 % (ref 0.0–0.2)

## 2020-02-05 LAB — TSH: TSH: 5.607 u[IU]/mL — ABNORMAL HIGH (ref 0.350–4.500)

## 2020-02-05 LAB — T4, FREE: Free T4: 1.05 ng/dL (ref 0.61–1.12)

## 2020-02-05 MED ORDER — SODIUM CHLORIDE 0.9 % IV SOLN
240.0000 mg | Freq: Once | INTRAVENOUS | Status: AC
  Administered 2020-02-05: 240 mg via INTRAVENOUS
  Filled 2020-02-05: qty 24

## 2020-02-05 MED ORDER — SODIUM CHLORIDE 0.9 % IV SOLN
Freq: Once | INTRAVENOUS | Status: AC
  Filled 2020-02-05: qty 250

## 2020-02-05 MED ORDER — HEPARIN SOD (PORK) LOCK FLUSH 100 UNIT/ML IV SOLN
500.0000 [IU] | Freq: Once | INTRAVENOUS | Status: AC | PRN
  Administered 2020-02-05: 500 [IU]
  Filled 2020-02-05: qty 5

## 2020-02-05 MED ORDER — SODIUM CHLORIDE 0.9% FLUSH
10.0000 mL | INTRAVENOUS | Status: DC | PRN
  Administered 2020-02-05: 10 mL via INTRAVENOUS
  Filled 2020-02-05: qty 10

## 2020-02-05 NOTE — Progress Notes (Signed)
Hematology/Oncology follow-up [facility] [phone] [phone]   Patient Care Team: Albina Billet, MD as PCP - General (Internal Medicine) Earlie Server, MD as Consulting Physician (Hematology and Oncology)  REFERRING PROVIDER: Albina Billet, MD  CHIEF COMPLAINTS/REASON FOR VISIT:  Follow-up for kidney cancer treatments  HISTORY OF PRESENTING ILLNESS:   Henry Moore is a  60 y.o.  male with PMH listed below was seen in consultation at the request of  Albina Billet, MD  for evaluation of kidney cancer Extensive medical records were reviewed Patient had MRI lumbar spine without contrast done on 07/13/2018 which showed mired lumbar degenerative disease. CT thoracic spine was done on 08/05/2018 which showed 5 cm left renal mass. 09/04/2018 CT abdomen and pelvis with and without contrast showed 5 cm round enhancing solid mass in the left kidney.  No involvement of left renal vein.  No lymphadenopathy Morphologic changes consistent with cirrhosis and the portal hypertension.  Small volume ascites and splenomegaly. . 10/16/2018 Left nephrectomy showed renal cell carcinoma, clear-cell type, nuclear grade 4, tumor extends into the renal vein and renal sinus fat.  Urethral, vascular and or resection margins are negative for tumor pT3a Nx Mx  01/28/2019 patient had another CT chest abdomen pelvis done with contrast Showed interval left nephrectomy.  Scattered tiny pulmonary nodules bilaterally.  Nonspecific.  No other evidence of metastatic disease.  Cirrhosis changes extensive coronary and aortic atherosclerosis  07/14/2019 patient underwent surveillance CT chest abdomen pelvis without contrast Interval progression of pulmonary metastasis with new enlarged right paratracheal lymph node 1.7 cm, previously 0.6 cm one-point since worrisome for metastatic adenopathy.  Multiple progressive pulmonary nodules, index nodule within the lingula measures 1.3 cm, previously  3 mm. Index subpleural nodule within the anterior right upper lobe measuring 1.8 cm, previously 3 cm Index nodule within the post lateral right lower lobe measuring 5 mm, new from previous care Aortic sclerosis.  Three-vessel coronary artery calcification noted.  Cirrhosis changes.  Splenomegaly.  Patient was referred to establish care with medical oncology for further discussion and evaluation of metastatic renal cell carcinoma. Patient reports feeling well at baseline today.  Denies any shortness of breath, cough, hemoptysis, back pain, headache. He quitted smoking 2015.  Not currently actively drinking alcohol as well. Patient has chronic back pain unchanged.  # Liver cirrhosis with small amount of ascites/splenomegaly patient sees gastroenterology Dr. [name].  . He will get ultrasound surveillance due in February 2021 for screening of Frost.  EGD surveillance for Barrett's plan in April 2021.  # Diabetes, on Humalog sliding scale and metformin.  #Family history of cancer and personal history of RCC.  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  Status post ipilimumab and nivolumab treatment.  # 10/29/2019 CT chest abdomen pelvis without contrast images were independently reviewed by me and discussed with patient.   CT showed marked improvement with resolution of many of the pulmonary nodules, prominent reduced size of other nodules.  Considerably reduced size of the right lower paratracheal lymph node now 0.9 cm in diameter. Hepatic cirrhosis with prominent splenomegaly and trace perisplenic ascites. Chronic CT findings includes aortic atherosclerotic vascular disease.  Mild sigmoid colon diverticulosis  # CKD, he establish care with Dr. Candiss Norse for chronic  kidney disease.  Blood pressure medication has been adjusted.  Hydrochlorothiazide decreased to 12.5 mg daily.  INTERVAL HISTORY Henry Moore is a 59 y.o. male who has above history reviewed by me today presents for follow up visit for management of stage IV RCC  Problems and complaints are listed below: Patient has no new complaints today.  Chronic fatigue is at the baseline.  Skin abscess has resolved.  Review of Systems  Constitutional: Positive for fatigue. Negative for appetite change, chills, fever and unexpected weight change.  HENT:   Negative for hearing loss and voice change.   Eyes: Negative for eye problems and icterus.  Respiratory: Negative for chest tightness, cough and shortness of breath.   Cardiovascular: Negative for chest pain and leg swelling.  Gastrointestinal: Negative for abdominal distention and abdominal pain.  Endocrine: Negative for hot flashes.  Genitourinary: Negative for difficulty urinating, dysuria and frequency.   Musculoskeletal: Negative for arthralgias.       Chronic back pain  Skin: Negative for itching and rash.  Neurological: Negative for light-headedness and numbness.  Hematological: Negative for adenopathy. Does not bruise/bleed easily.  Psychiatric/Behavioral: Negative for confusion.    MEDICAL HISTORY:  Past Medical History:  Diagnosis Date  . Cough    SINUS DRAINAGE CAUSING COUGHING , REPORTS CLEAR MUCOUS   . Diabetes mellitus without complication ([name])   . Family history of breast cancer   . Family history of colon cancer   . Family history of stomach cancer   . HTN (hypertension)   . Hyperlipemia   . Left renal mass   . Platelets decreased ([name])    DENIES UNSUAL BLEEDING   . PONV (postoperative nausea and vomiting)   . Renal cell carcinoma ([name]) 08/04/2019  . WBC decreased     SURGICAL HISTORY: Past Surgical History:  Procedure Laterality Date  . ANKLE SURGERY Left   . ANTERIOR CERVICAL DECOMP/DISCECTOMY FUSION N/A 04/16/2014    Procedure: ANTERIOR CERVICAL DECOMPRESSION/DISCECTOMY FUSION  (ACDF C5-C7)   (2 LEVELS)     ;  Surgeon: Melina Schools, MD;  Location: [name];  Service: Orthopedics;  Laterality: N/A;  . APPENDECTOMY    . BACK SURGERY    . ESOPHAGOGASTRODUODENOSCOPY (EGD) WITH PROPOFOL N/A 02/06/2019   Procedure: ESOPHAGOGASTRODUODENOSCOPY (EGD) WITH PROPOFOL;  Surgeon: [name], MD;  Location: [facility] ENDOSCOPY;  Service: Gastroenterology;  Laterality: N/A;  . ESOPHAGOGASTRODUODENOSCOPY (EGD) WITH PROPOFOL N/A 03/04/2019   Procedure: ESOPHAGOGASTRODUODENOSCOPY (EGD) WITH PROPOFOL;  Surgeon: Lin Landsman, MD;  Location: [facility]COPY;  Service: Gastroenterology;  Laterality: N/A;  . ESOPHAGOGASTRODUODENOSCOPY (EGD) WITH PROPOFOL N/A 04/22/2019   Procedure: ESOPHAGOGASTRODUODENOSCOPY (EGD) WITH PROPOFOL;  Surgeon: [name], MD;  Location: [facility] ENDOSCOPY;  Service: Gastroenterology;  Laterality: N/A;  . FRACTURE SURGERY    . HYDROCELE EXCISION    . KNEE ARTHROSCOPY Bilateral   . LAPAROSCOPIC NEPHRECTOMY, HAND ASSISTED Left 10/16/2018   Procedure: LEFT HAND ASSISTED LAPAROSCOPIC NEPHRECTOMY;  Surgeon: Lucas Mallow, MD;  Location: [facility] ORS;  Service: Urology;  Laterality: Left;  . NASAL SINUS SURGERY     X 2  . 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 PORTA CATH INSERTION N/A 11/11/2019   Procedure: PORTA CATH INSERTION;  Surgeon: Katha Cabal, MD;  Location: [facility];  Service: Cardiovascular;  Laterality: N/A;  . SHOULDER SURGERY Left     SOCIAL HISTORY: Social History   Socioeconomic History  . Marital status: Married    Spouse name: Not on file  . Number of  children: Not on file  . Years of education: Not on file  . Highest education level: Not on file  Occupational History  . Not on file  Tobacco Use  . Smoking status: Former Smoker    Packs/day: 0.25    Years: 20.00    Pack years: 5.00    Quit date: 2015    Years since quitting: 6.5  . Smokeless tobacco: Never  Used  Vaping Use  . Vaping Use: Never used  Substance and Sexual Activity  . Alcohol use: Not Currently    Alcohol/week: 0.0 standard drinks    Comment: no alchol in 1 year  . Drug use: No  . Sexual activity: Not on file  Other Topics Concern  . Not on file  Social History Narrative  . Not on file   Social Determinants of Health   Financial Resource Strain:   . Difficulty of Paying Living Expenses:   Food Insecurity:   . Worried About Charity fundraiser in the Last Year:   . Arboriculturist in the Last Year:   Transportation Needs:   . Film/video editor (Medical):   Marland Kitchen Lack of Transportation (Non-Medical):   Physical Activity:   . Days of Exercise per Week:   . Minutes of Exercise per Session:   Stress:   . Feeling of Stress :   Social Connections:   . Frequency of Communication with Friends and Family:   . Frequency of Social Gatherings with Friends and Family:   . Attends Religious Services:   . Active Member of Clubs or Organizations:   . Attends Archivist Meetings:   Marland Kitchen Marital Status:   Intimate Partner Violence:   . Fear of Current or Ex-Partner:   . Emotionally Abused:   Marland Kitchen Physically Abused:   . Sexually Abused:     FAMILY HISTORY: Family History  Problem Relation Age of Onset  . Diabetes Mother   . Cancer Mother        neuroendocrine cancer  . Cancer Father        neuroendocrine cancer of meninges  . Cancer Maternal Grandmother        tomach dx 64  . Alzheimer's disease Paternal Grandmother   . Lung cancer Paternal Grandfather   . Lung cancer Maternal Aunt   . Colon cancer Maternal Uncle   . Breast cancer Paternal Aunt        both dx 53s  . Ovarian cancer Other        dx 80  . Breast cancer Cousin     ALLERGIES:  is allergic to codeine and mucinex [guaifenesin er].  MEDICATIONS:  Current Outpatient Medications  Medication Sig Dispense Refill  . ACCU-CHEK AVIVA PLUS test strip CHECK BL00D SUGAR ONCE OR TWICE DAILY. 100 each  PRN  . amLODipine (NORVASC) 10 MG tablet Take 10 mg by mouth daily.    . carvedilol (COREG) 25 MG tablet Take 1 tablet (25 mg total) by mouth 2 (two) times daily with a meal. 180 tablet 3  . cephALEXin (KEFLEX) 500 MG capsule Take 500 mg by mouth 3 (three) times daily.     . Cholecalciferol (VITAMIN D3) 125 MCG (5000 UT) CAPS Take 5,000 Units by mouth daily.    Marland Kitchen glipiZIDE (GLUCOTROL) 5 MG tablet Take 1 tablet (5 mg total) by mouth daily before breakfast. 90 tablet 3  . hydrochlorothiazide (HYDRODIURIL) 25 MG tablet TAKE 1 TABLET DAILY. (Patient taking differently: Take 12.5 mg by mouth  daily. ) 90 tablet 0  . insulin lispro (INSULIN LISPRO) 100 UNIT/ML KwikPen Junior Inject into the skin 3 (three) times daily as needed (Above 250). Sliding scale    . losartan (COZAAR) 100 MG tablet Take 1 tablet by mouth daily. (Patient taking differently: Take 100 mg by mouth daily. ) 90 tablet 0  . lovastatin (MEVACOR) 40 MG tablet Take 1 tablet (40 mg total) by mouth at bedtime. 60 tablet 0  . metFORMIN (GLUCOPHAGE-XR) 500 MG 24 hr tablet Take 500 mg by mouth 2 (two) times daily.     . prochlorperazine (COMPAZINE) 10 MG tablet Take 1 tablet (10 mg total) by mouth every 6 (six) hours as needed for nausea or vomiting. 30 tablet 0  . vitamin B-12 (CYANOCOBALAMIN) 1000 MCG tablet Take 1 tablet (1,000 mcg total) by mouth daily. 90 tablet 0   No current facility-administered medications for this visit.   Facility-Administered Medications Ordered in Other Visits  Medication Dose Route Frequency Provider Last Rate Last Admin  . sodium chloride flush (NS) 0.9 % injection 10 mL  10 mL Intravenous PRN Earlie Server, MD   10 mL at 01/22/20 0831  . sodium chloride flush (NS) 0.9 % injection 10 mL  10 mL Intravenous PRN Earlie Server, MD         PHYSICAL EXAMINATION: ECOG PERFORMANCE STATUS: 1 - Symptomatic but completely ambulatory Vitals:   02/05/20 0837  BP: 122/72  Pulse: 71  Resp: 18  Temp: (!) 96.1 F (35.6 C)    Filed Weights   02/05/20 0837  Weight: 253 lb 1.6 oz (114.8 kg)    Physical Exam Constitutional:      General: He is not in acute distress.    Appearance: He is obese.  HENT:     Head: Normocephalic and atraumatic.  Eyes:     General: No scleral icterus. Cardiovascular:     Rate and Rhythm: Normal rate and regular rhythm.     Heart sounds: Normal heart sounds.  Pulmonary:     Effort: Pulmonary effort is normal. No respiratory distress.     Breath sounds: No wheezing.  Abdominal:     General: Bowel sounds are normal. There is no distension.     Palpations: Abdomen is soft.  Musculoskeletal:        General: No deformity. Normal range of motion.     Cervical back: Normal range of motion and neck supple.  Skin:    General: Skin is warm and dry.     Findings: No erythema or rash.  Neurological:     Mental Status: He is alert and oriented to person, place, and time. Mental status is at baseline.     Cranial Nerves: No cranial nerve deficit.     Coordination: Coordination normal.  Psychiatric:        Mood and Affect: Mood normal.     LABORATORY DATA:  I have reviewed the data as listed Lab Results  Component Value Date   WBC 2.4 (L) 01/22/2020   HGB 9.5 (L) 01/22/2020   HCT 27.3 (L) 01/22/2020   MCV 91.6 01/22/2020   PLT 66 (L) 01/22/2020   Recent Labs    01/01/20 0807 01/08/20 0810 01/22/20 0828  NA 134* 136 137  K 4.8 4.6 4.3  CL 104 106 104  CO2 22 22 23   GLUCOSE 91 114* 118*  BUN 28* 25* 20  CREATININE 1.80* 1.74* 1.74*  CALCIUM 9.0 8.8* 8.6*  GFRNONAA 41* 43* 43*  GFRAA 48*  50* 50*  PROT 7.8 7.9 7.7  ALBUMIN 4.1 4.1 3.9  AST 34 35 30  ALT 25 29 25   ALKPHOS 94 108 107  BILITOT 1.2 0.9 1.0   Iron/TIBC/Ferritin/ %Sat    Component Value Date/Time   IRON 52 12/18/2019 0826   IRON 105 10/15/2018 1325   TIBC 284 12/18/2019 0826   TIBC 244 (L) 10/15/2018 1325   FERRITIN 146 12/18/2019 0826   FERRITIN 588 (H) 10/15/2018 1325   IRONPCTSAT 18  12/18/2019 0826   IRONPCTSAT 43 10/15/2018 1325      RADIOGRAPHIC STUDIES: I have personally reviewed the radiological images as listed and agreed with the findings in the report. PERIPHERAL VASCULAR CATHETERIZATION  Result Date: 11/11/2019 See op note    ASSESSMENT & PLAN:  1. Renal cell carcinoma, unspecified laterality ([place])   2. Encounter for antineoplastic immunotherapy   3. Thrombocytopenia ([facility])   4. 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 Counts acceptable to proceed with nivolumab. CT chest abdomen pelvis wo for monitoring disease status  #Neutropenia, ANC has improved to 1.5.  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  Counts are stable. Continue vitamin B12 supplementation daily. Flow cytometry showed no immunophenotypic abnormality detected.  Chronic thrombocytopenia, platelet counts has been stable.  Continue to monitor.  Thrombocytopenia secondary to cirrhosis. #Chronic kidney disease, patient was seen by Dr. Candiss Norse on 12/30/2019 Hydrochlorothiazide dose has been reduced.  Avoid nephrotoxins.  #Chronic liver cirrhosis with thrombocytopenia and neutropenia.   Platelet counts are stable at his baseline.  #Anemia secondary to chronic kidney disease, hemoglobin stable.   #Slightly elevated TSH, and free T4 is normal., likely secondary to immunotherapy.   Continue to monitor.  TSH is pending today. #Port-A-Cath in place, Follow-up 2 week lab MD for assessment evaluation prior to nivolumab. All questions were answered. The patient knows to call the clinic with any problems questions or concerns.   Earlie Server, MD, PhD Hematology Oncology [facility] Pager- [phone] 02/05/2020

## 2020-02-05 NOTE — Progress Notes (Signed)
Patient denies new problems/concerns today.   °

## 2020-02-13 ENCOUNTER — Other Ambulatory Visit: Payer: Self-pay

## 2020-02-13 ENCOUNTER — Ambulatory Visit
Admission: RE | Admit: 2020-02-13 | Discharge: 2020-02-13 | Disposition: A | Discharge status: Home / Self Care | Payer: BC Managed Care – PPO | Source: Ambulatory Visit | Attending: Oncology | Admitting: Oncology

## 2020-02-13 DIAGNOSIS — Z905 Acquired absence of kidney: Secondary | ICD-10-CM | POA: Diagnosis not present

## 2020-02-13 DIAGNOSIS — R161 Splenomegaly, not elsewhere classified: Secondary | ICD-10-CM | POA: Insufficient documentation

## 2020-02-13 DIAGNOSIS — I2510 Atherosclerotic heart disease of native coronary artery without angina pectoris: Secondary | ICD-10-CM | POA: Insufficient documentation

## 2020-02-13 DIAGNOSIS — K7460 Unspecified cirrhosis of liver: Secondary | ICD-10-CM | POA: Insufficient documentation

## 2020-02-13 DIAGNOSIS — I700 Atherosclerosis of aorta: Secondary | ICD-10-CM | POA: Insufficient documentation

## 2020-02-13 DIAGNOSIS — C649 Malignant neoplasm of unspecified kidney, except renal pelvis: Secondary | ICD-10-CM

## 2020-02-19 ENCOUNTER — Encounter: Payer: Self-pay | Admitting: Oncology

## 2020-02-19 ENCOUNTER — Inpatient Hospital Stay: Payer: Medicare Other

## 2020-02-19 ENCOUNTER — Inpatient Hospital Stay (HOSPITAL_BASED_OUTPATIENT_CLINIC_OR_DEPARTMENT_OTHER): Payer: Medicare Other | Admitting: Oncology

## 2020-02-19 ENCOUNTER — Other Ambulatory Visit: Payer: Self-pay

## 2020-02-19 VITALS — BP 110/69 | HR 74 | Temp 96.1°F | Resp 18 | Wt 251.7 lb

## 2020-02-19 DIAGNOSIS — C642 Malignant neoplasm of left kidney, except renal pelvis: Secondary | ICD-10-CM

## 2020-02-19 DIAGNOSIS — Z5112 Encounter for antineoplastic immunotherapy: Secondary | ICD-10-CM | POA: Diagnosis not present

## 2020-02-19 DIAGNOSIS — D649 Anemia, unspecified: Secondary | ICD-10-CM

## 2020-02-19 DIAGNOSIS — C649 Malignant neoplasm of unspecified kidney, except renal pelvis: Secondary | ICD-10-CM

## 2020-02-19 DIAGNOSIS — R7989 Other specified abnormal findings of blood chemistry: Secondary | ICD-10-CM

## 2020-02-19 DIAGNOSIS — D696 Thrombocytopenia, unspecified: Secondary | ICD-10-CM | POA: Diagnosis not present

## 2020-02-19 DIAGNOSIS — D708 Other neutropenia: Secondary | ICD-10-CM

## 2020-02-19 DIAGNOSIS — Z95828 Presence of other vascular implants and grafts: Secondary | ICD-10-CM

## 2020-02-19 LAB — COMPREHENSIVE METABOLIC PANEL
ALT: 27 U/L (ref 0–44)
AST: 35 U/L (ref 15–41)
Albumin: 4.2 g/dL (ref 3.5–5.0)
Alkaline Phosphatase: 120 U/L (ref 38–126)
Anion gap: 5 (ref 5–15)
BUN: 27 mg/dL — ABNORMAL HIGH (ref 6–20)
CO2: 23 mmol/L (ref 22–32)
Calcium: 9 mg/dL (ref 8.9–10.3)
Chloride: 108 mmol/L (ref 98–111)
Creatinine, Ser: 1.92 mg/dL — ABNORMAL HIGH (ref 0.61–1.24)
GFR calc Af Amer: 44 mL/min — ABNORMAL LOW (ref >60)
GFR calc non Af Amer: 38 mL/min — ABNORMAL LOW (ref >60)
Glucose, Bld: 163 mg/dL — ABNORMAL HIGH (ref 70–99)
Potassium: 4.3 mmol/L (ref 3.5–5.1)
Sodium: 136 mmol/L (ref 135–145)
Total Bilirubin: 0.9 mg/dL (ref 0.3–1.2)
Total Protein: 7.7 g/dL (ref 6.5–8.1)

## 2020-02-19 LAB — CBC WITH DIFFERENTIAL/PLATELET
Abs Immature Granulocytes: 0.01 10*3/uL (ref 0.00–0.07)
Basophils Absolute: 0 10*3/uL (ref 0.0–0.1)
Basophils Relative: 1 %
Eosinophils Absolute: 0.1 10*3/uL (ref 0.0–0.5)
Eosinophils Relative: 5 %
HCT: 27 % — ABNORMAL LOW (ref 39.0–52.0)
Hemoglobin: 9.6 g/dL — ABNORMAL LOW (ref 13.0–17.0)
Immature Granulocytes: 1 %
Lymphocytes Relative: 23 %
Lymphs Abs: 0.5 10*3/uL — ABNORMAL LOW (ref 0.7–4.0)
MCH: 32 pg (ref 26.0–34.0)
MCHC: 35.6 g/dL (ref 30.0–36.0)
MCV: 90 fL (ref 80.0–100.0)
Monocytes Absolute: 0.3 10*3/uL (ref 0.1–1.0)
Monocytes Relative: 15 %
Neutro Abs: 1.1 10*3/uL — ABNORMAL LOW (ref 1.7–7.7)
Neutrophils Relative %: 55 %
Platelets: 60 10*3/uL — ABNORMAL LOW (ref 150–400)
RBC: 3 MIL/uL — ABNORMAL LOW (ref 4.22–5.81)
RDW: 13.2 % (ref 11.5–15.5)
WBC: 2 10*3/uL — ABNORMAL LOW (ref 4.0–10.5)
nRBC: 0 % (ref 0.0–0.2)

## 2020-02-19 LAB — TSH: TSH: 4.467 u[IU]/mL (ref 0.350–4.500)

## 2020-02-19 MED ORDER — SODIUM CHLORIDE 0.9% FLUSH
10.0000 mL | INTRAVENOUS | Status: DC | PRN
  Administered 2020-02-19: 10 mL via INTRAVENOUS
  Filled 2020-02-19: qty 10

## 2020-02-19 MED ORDER — SODIUM CHLORIDE 0.9 % IV SOLN
240.0000 mg | Freq: Once | INTRAVENOUS | Status: AC
  Administered 2020-02-19: 240 mg via INTRAVENOUS
  Filled 2020-02-19: qty 24

## 2020-02-19 MED ORDER — SODIUM CHLORIDE 0.9 % IV SOLN
Freq: Once | INTRAVENOUS | Status: AC
  Filled 2020-02-19: qty 250

## 2020-02-19 MED ORDER — HEPARIN SOD (PORK) LOCK FLUSH 100 UNIT/ML IV SOLN
500.0000 [IU] | Freq: Once | INTRAVENOUS | Status: AC | PRN
  Administered 2020-02-19: 500 [IU]
  Filled 2020-02-19: qty 5

## 2020-02-19 MED ORDER — HEPARIN SOD (PORK) LOCK FLUSH 100 UNIT/ML IV SOLN
INTRAVENOUS | Status: AC
  Filled 2020-02-19: qty 5

## 2020-02-19 NOTE — Progress Notes (Signed)
Hematology/Oncology follow-up [facility] [phone] [phone]   Patient Care Team: Albina Billet, MD as PCP - General (Internal Medicine) Earlie Server, MD as Consulting Physician (Hematology and Oncology)  REFERRING PROVIDER: Albina Billet, MD  CHIEF COMPLAINTS/REASON FOR VISIT:  Follow-up for kidney cancer treatments  HISTORY OF PRESENTING ILLNESS:   Henry Moore is a  59 y.o.  male with PMH listed below was seen in consultation at the request of  Albina Billet, MD  for evaluation of kidney cancer Extensive medical records were reviewed Patient had MRI lumbar spine without contrast done on 07/13/2018 which showed mired lumbar degenerative disease. CT thoracic spine was done on 08/05/2018 which showed 5 cm left renal mass. 09/04/2018 CT abdomen and pelvis with and without contrast showed 5 cm round enhancing solid mass in the left kidney.  No involvement of left renal vein.  No lymphadenopathy Morphologic changes consistent with cirrhosis and the portal hypertension.  Small volume ascites and splenomegaly. . 10/16/2018 Left nephrectomy showed renal cell carcinoma, clear-cell type, nuclear grade 4, tumor extends into the renal vein and renal sinus fat.  Urethral, vascular and or resection margins are negative for tumor pT3a Nx Mx  01/28/2019 patient had another CT chest abdomen pelvis done with contrast Showed interval left nephrectomy.  Scattered tiny pulmonary nodules bilaterally.  Nonspecific.  No other evidence of metastatic disease.  Cirrhosis changes extensive coronary and aortic atherosclerosis  07/14/2019 patient underwent surveillance CT chest abdomen pelvis without contrast Interval progression of pulmonary metastasis with new enlarged right paratracheal lymph node 1.7 cm, previously 0.6 cm one-point since worrisome for metastatic adenopathy.  Multiple progressive pulmonary nodules, index nodule within the lingula measures 1.3 cm, previously  3 mm. Index subpleural nodule within the anterior right upper lobe measuring 1.8 cm, previously 3 cm Index nodule within the post lateral right lower lobe measuring 5 mm, new from previous care Aortic sclerosis.  Three-vessel coronary artery calcification noted.  Cirrhosis changes.  Splenomegaly.  Patient was referred to establish care with medical oncology for further discussion and evaluation of metastatic renal cell carcinoma. Patient reports feeling well at baseline today.  Denies any shortness of breath, cough, hemoptysis, back pain, headache. He quitted smoking 2015.  Not currently actively drinking alcohol as well. Patient has chronic back pain unchanged.  # Liver cirrhosis with small amount of ascites/splenomegaly patient sees gastroenterology Dr. [name].  . He will get ultrasound surveillance due in February 2021 for screening of Frost.  EGD surveillance for Barrett's plan in April 2021.  # Diabetes, on Humalog sliding scale and metformin.  #Family history of cancer and personal history of RCC.  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  Status post ipilimumab and nivolumab treatment.  # 10/29/2019 CT chest abdomen pelvis without contrast images were independently reviewed by me and discussed with patient.   CT showed marked improvement with resolution of many of the pulmonary nodules, prominent reduced size of other nodules.  Considerably reduced size of the right lower paratracheal lymph node now 0.9 cm in diameter. Hepatic cirrhosis with prominent splenomegaly and trace perisplenic ascites. Chronic CT findings includes aortic atherosclerotic vascular disease.  Mild sigmoid colon diverticulosis  # CKD, he establish care with Dr. Candiss Norse for chronic  kidney disease.  Blood pressure medication has been adjusted.  Hydrochlorothiazide decreased to 12.5 mg daily.  INTERVAL HISTORY Henry Moore is a 59 y.o. male who has above history reviewed by me today presents for follow up visit for management of stage IV RCC  Problems and complaints are listed below: Patient has no new concerns today.  Chronic fatigue is at baseline. His mom passed away last week.   Review of Systems  Constitutional: Positive for fatigue. Negative for appetite change, chills, fever and unexpected weight change.  HENT:   Negative for hearing loss and voice change.   Eyes: Negative for eye problems and icterus.  Respiratory: Negative for chest tightness, cough and shortness of breath.   Cardiovascular: Negative for chest pain and leg swelling.  Gastrointestinal: Negative for abdominal distention and abdominal pain.  Endocrine: Negative for hot flashes.  Genitourinary: Negative for difficulty urinating, dysuria and frequency.   Musculoskeletal: Negative for arthralgias.       Chronic back pain  Skin: Negative for itching and rash.  Neurological: Negative for light-headedness and numbness.  Hematological: Negative for adenopathy. Does not bruise/bleed easily.  Psychiatric/Behavioral: Negative for confusion.    MEDICAL HISTORY:  Past Medical History:  Diagnosis Date  . Cough    SINUS DRAINAGE CAUSING COUGHING , REPORTS CLEAR MUCOUS   . Diabetes mellitus without complication ([place])   . Family history of breast cancer   . Family history of colon cancer   . Family history of stomach cancer   . HTN (hypertension)   . Hyperlipemia   . Left renal mass   . Platelets decreased ([place])    DENIES UNSUAL BLEEDING   . PONV (postoperative nausea and vomiting)   . Renal cell carcinoma ([place]) 08/04/2019  . WBC decreased     SURGICAL HISTORY: Past Surgical History:  Procedure Laterality Date  . ANKLE SURGERY Left   . ANTERIOR CERVICAL DECOMP/DISCECTOMY FUSION N/A 04/16/2014    Procedure: ANTERIOR CERVICAL DECOMPRESSION/DISCECTOMY FUSION  (ACDF C5-C7)   (2 LEVELS)     ;  Surgeon: Melina Schools, MD;  Location: [facility];  Service: Orthopedics;  Laterality: N/A;  . APPENDECTOMY    . BACK SURGERY    . ESOPHAGOGASTRODUODENOSCOPY (EGD) WITH PROPOFOL N/A 02/06/2019   Procedure: ESOPHAGOGASTRODUODENOSCOPY (EGD) WITH PROPOFOL;  Surgeon: [name], MD;  Location: [facility] ENDOSCOPY;  Service: Gastroenterology;  Laterality: N/A;  . ESOPHAGOGASTRODUODENOSCOPY (EGD) WITH PROPOFOL N/A 03/04/2019   Procedure: ESOPHAGOGASTRODUODENOSCOPY (EGD) WITH PROPOFOL;  Surgeon: Lin Landsman, MD;  Location: [facility] ENDOSCOPY;  Service: Gastroenterology;  Laterality: N/A;  . ESOPHAGOGASTRODUODENOSCOPY (EGD) WITH PROPOFOL N/A 04/22/2019   Procedure: ESOPHAGOGASTRODUODENOSCOPY (EGD) WITH PROPOFOL;  Surgeon: [name], MD;  Location: [facility] ENDOSCOPY;  Service: Gastroenterology;  Laterality: N/A;  . FRACTURE SURGERY    . HYDROCELE EXCISION    . KNEE ARTHROSCOPY Bilateral   . LAPAROSCOPIC NEPHRECTOMY, HAND ASSISTED Left 10/16/2018   Procedure: LEFT HAND ASSISTED LAPAROSCOPIC NEPHRECTOMY;  Surgeon: Lucas Mallow, MD;  Location: [facility] ORS;  Service: Urology;  Laterality: Left;  . NASAL SINUS SURGERY     X 2  . 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 PORTA CATH INSERTION N/A 11/11/2019   Procedure: PORTA CATH INSERTION;  Surgeon: Katha Cabal, MD;  Location: [facility] CV LAB;  Service: Cardiovascular;  Laterality: N/A;  . SHOULDER SURGERY Left     SOCIAL HISTORY: Social History   Socioeconomic History  . Marital status: Married    Spouse name: Not on file  . Number  of children: Not on file  . Years of education: Not on file  . Highest education level: Not on file  Occupational History  . Not on file  Tobacco Use  . Smoking status: Former Smoker    Packs/day: 0.25    Years: 20.00    Pack years: 5.00    Quit date: 2015    Years since quitting: 6.5  . Smokeless tobacco: Never  Used  Vaping Use  . Vaping Use: Never used  Substance and Sexual Activity  . Alcohol use: Not Currently    Alcohol/week: 0.0 standard drinks    Comment: no alchol in 1 year  . Drug use: No  . Sexual activity: Not on file  Other Topics Concern  . Not on file  Social History Narrative  . Not on file   Social Determinants of Health   Financial Resource Strain:   . Difficulty of Paying Living Expenses:   Food Insecurity:   . Worried About Charity fundraiser in the Last Year:   . Arboriculturist in the Last Year:   Transportation Needs:   . Film/video editor (Medical):   Marland Kitchen Lack of Transportation (Non-Medical):   Physical Activity:   . Days of Exercise per Week:   . Minutes of Exercise per Session:   Stress:   . Feeling of Stress :   Social Connections:   . Frequency of Communication with Friends and Family:   . Frequency of Social Gatherings with Friends and Family:   . Attends Religious Services:   . Active Member of Clubs or Organizations:   . Attends Archivist Meetings:   Marland Kitchen Marital Status:   Intimate Partner Violence:   . Fear of Current or Ex-Partner:   . Emotionally Abused:   Marland Kitchen Physically Abused:   . Sexually Abused:     FAMILY HISTORY: Family History  Problem Relation Age of Onset  . Diabetes Mother   . Cancer Mother        neuroendocrine cancer  . Cancer Father        neuroendocrine cancer of meninges  . Cancer Maternal Grandmother        tomach dx 9  . Alzheimer's disease Paternal Grandmother   . Lung cancer Paternal Grandfather   . Lung cancer Maternal Aunt   . Colon cancer Maternal Uncle   . Breast cancer Paternal Aunt        both dx 61s  . Ovarian cancer Other        dx 34  . Breast cancer Cousin     ALLERGIES:  is allergic to codeine and mucinex [guaifenesin er].  MEDICATIONS:  Current Outpatient Medications  Medication Sig Dispense Refill  . ACCU-CHEK AVIVA PLUS test strip CHECK BL00D SUGAR ONCE OR TWICE DAILY. 100 each  PRN  . amLODipine (NORVASC) 10 MG tablet Take 10 mg by mouth daily.    . carvedilol (COREG) 25 MG tablet Take 1 tablet (25 mg total) by mouth 2 (two) times daily with a meal. 180 tablet 3  . cephALEXin (KEFLEX) 500 MG capsule Take 500 mg by mouth 3 (three) times daily.  (Patient not taking: Reported on 02/05/2020)    . Cholecalciferol (VITAMIN D3) 125 MCG (5000 UT) CAPS Take 5,000 Units by mouth daily.    Marland Kitchen glipiZIDE (GLUCOTROL) 5 MG tablet Take 1 tablet (5 mg total) by mouth daily before breakfast. 90 tablet 3  . hydrochlorothiazide (HYDRODIURIL) 25 MG tablet TAKE 1 TABLET DAILY. (Patient  taking differently: Take 12.5 mg by mouth daily. ) 90 tablet 0  . insulin lispro (INSULIN LISPRO) 100 UNIT/ML KwikPen Junior Inject into the skin 3 (three) times daily as needed (Above 250). Sliding scale (Patient not taking: Reported on 02/05/2020)    . losartan (COZAAR) 100 MG tablet Take 1 tablet by mouth daily. (Patient taking differently: Take 100 mg by mouth daily. ) 90 tablet 0  . lovastatin (MEVACOR) 40 MG tablet Take 1 tablet (40 mg total) by mouth at bedtime. 60 tablet 0  . metFORMIN (GLUCOPHAGE-XR) 500 MG 24 hr tablet Take 500 mg by mouth 2 (two) times daily.     . prochlorperazine (COMPAZINE) 10 MG tablet Take 1 tablet (10 mg total) by mouth every 6 (six) hours as needed for nausea or vomiting. 30 tablet 0  . vitamin B-12 (CYANOCOBALAMIN) 1000 MCG tablet Take 1 tablet (1,000 mcg total) by mouth daily. 90 tablet 0   No current facility-administered medications for this visit.   Facility-Administered Medications Ordered in Other Visits  Medication Dose Route Frequency Provider Last Rate Last Admin  . sodium chloride flush (NS) 0.9 % injection 10 mL  10 mL Intravenous PRN Earlie Server, MD   10 mL at 01/22/20 0831  . sodium chloride flush (NS) 0.9 % injection 10 mL  10 mL Intravenous PRN Earlie Server, MD         PHYSICAL EXAMINATION: ECOG PERFORMANCE STATUS: 1 - Symptomatic but completely ambulatory There were  no vitals filed for this visit. There were no vitals filed for this visit.  Physical Exam Constitutional:      General: He is not in acute distress.    Appearance: He is obese.  HENT:     Head: Normocephalic and atraumatic.  Eyes:     General: No scleral icterus. Cardiovascular:     Rate and Rhythm: Normal rate and regular rhythm.     Heart sounds: Normal heart sounds.  Pulmonary:     Effort: Pulmonary effort is normal. No respiratory distress.     Breath sounds: No wheezing.  Abdominal:     General: Bowel sounds are normal. There is no distension.     Palpations: Abdomen is soft.  Musculoskeletal:        General: No deformity. Normal range of motion.     Cervical back: Normal range of motion and neck supple.  Skin:    General: Skin is warm and dry.     Findings: No erythema or rash.  Neurological:     Mental Status: He is alert and oriented to person, place, and time. Mental status is at baseline.     Cranial Nerves: No cranial nerve deficit.     Coordination: Coordination normal.  Psychiatric:        Mood and Affect: Mood normal.     LABORATORY DATA:  I have reviewed the data as listed Lab Results  Component Value Date   WBC 2.3 (L) 02/05/2020   HGB 9.7 (L) 02/05/2020   HCT 27.8 (L) 02/05/2020   MCV 90.3 02/05/2020   PLT 60 (L) 02/05/2020   Recent Labs    01/08/20 0810 01/22/20 0828 02/05/20 0815  NA 136 137 137  K 4.6 4.3 4.2  CL 106 104 106  CO2 22 23 22   GLUCOSE 114* 118* 129*  BUN 25* 20 25*  CREATININE 1.74* 1.74* 1.78*  CALCIUM 8.8* 8.6* 8.4*  GFRNONAA 43* 43* 42*  GFRAA 50* 50* 48*  PROT 7.9 7.7 7.6  ALBUMIN 4.1  3.9 4.0  AST 35 30 27  ALT 29 25 19   ALKPHOS 108 107 96  BILITOT 0.9 1.0 0.9   Iron/TIBC/Ferritin/ %Sat    Component Value Date/Time   IRON 52 12/18/2019 0826   IRON 105 10/15/2018 1325   TIBC 284 12/18/2019 0826   TIBC 244 (L) 10/15/2018 1325   FERRITIN 146 12/18/2019 0826   FERRITIN 588 (H) 10/15/2018 1325   IRONPCTSAT 18  12/18/2019 0826   IRONPCTSAT 43 10/15/2018 1325      RADIOGRAPHIC STUDIES: I have personally reviewed the radiological images as listed and agreed with the findings in the report. CT Abdomen Pelvis Wo Contrast  Result Date: 02/16/2020 CLINICAL DATA:  Restaging of metastatic left renal cell carcinoma, prior left nephrectomy. EXAM: CT CHEST, ABDOMEN AND PELVIS WITHOUT CONTRAST TECHNIQUE: Multidetector CT imaging of the chest, abdomen and pelvis was performed following the standard protocol without IV contrast. COMPARISON:  Multiple exams, including 10/29/2019 FINDINGS: CT CHEST FINDINGS Cardiovascular: Right Port-A-Cath tip: Right atrium. Coronary, aortic arch, and branch vessel atherosclerotic vascular disease. Mediastinum/Nodes: Further reduction in the size of the right paratracheal lymph node, currently 0.7 cm previously 1.8 cm on 07/14/2019 and previously 0.9 cm on 10/29/2019. Left internal mammary lymph nodes measure up to 0.5 cm in short axis (image 27/2), previously 0.6 cm on 10/29/2019. Lungs/Pleura: No new nodules are identified. Small subpleural nodules Adderall below 4 mm in diameter generally similar to prior. An index left lower lobe nodule measuring 3 mm in diameter on image 76/3 previously measured the same. Musculoskeletal: Lower cervical fusion. There several tiny sclerotic lesions for example in the right anterior fourth rib on image 63/3, these appear stable and there no compelling findings of osseous metastatic disease in the thorax. Thoracic spondylosis. CT ABDOMEN PELVIS FINDINGS Hepatobiliary: Nodular hepatic contour with prominence of the caudate lobe and left hepatic lobe compatible with hepatic cirrhosis, contour not appreciably changed. Borderline gallbladder wall thickening similar to prior. Pancreas: Unremarkable Spleen: Stable splenomegaly. Adrenals/Urinary Tract: Both adrenal glands appear normal. Left nephrectomy without visible local recurrence along the nephrectomy site.  Right renal contour unremarkable. No appreciable urinary tract calculi. Stomach/Bowel: Unremarkable Vascular/Lymphatic: Aortoiliac atherosclerotic vascular disease. Scattered likely reactive lymph nodes along the gastrohepatic ligament, porta hepatis, and retroperitoneum. Aortocaval lymph node 0.9 cm in short axis on image 77/2, stable. Reproductive: Penile implant, reservoir in the left lower pelvis. Other: Trace ascites in the pelvis, perisplenic, perihepatic region, similar to prior. Stable mild mesenteric edema. Musculoskeletal: Unremarkable IMPRESSION: 1. No findings of active malignancy. 2. Further reduction in the size of the right paratracheal lymph node, currently 0.7 cm previously 1.8 cm on 07/14/2019 and previously 0.9 cm on 10/29/2019. 3. Stable small mostly subpleural nodules in the lungs, without new or enlarging nodule. 4. Other imaging findings of potential clinical significance: Coronary atherosclerosis. Hepatic cirrhosis with portal venous hypertension including splenomegaly and trace ascites. Stable mild mesenteric edema. Borderline gallbladder wall thickening, similar to prior. Left nephrectomy without visible local recurrence. 5. Aortic atherosclerosis. Aortic Atherosclerosis (ICD10-I70.0). Electronically Signed   By: Van Clines M.D.   On: 02/16/2020 08:02   CT Chest Wo Contrast  Result Date: 02/16/2020 CLINICAL DATA:  Restaging of metastatic left renal cell carcinoma, prior left nephrectomy. EXAM: CT CHEST, ABDOMEN AND PELVIS WITHOUT CONTRAST TECHNIQUE: Multidetector CT imaging of the chest, abdomen and pelvis was performed following the standard protocol without IV contrast. COMPARISON:  Multiple exams, including 10/29/2019 FINDINGS: CT CHEST FINDINGS Cardiovascular: Right Port-A-Cath tip: Right atrium. Coronary, aortic arch, and  branch vessel atherosclerotic vascular disease. Mediastinum/Nodes: Further reduction in the size of the right paratracheal lymph node, currently 0.7 cm  previously 1.8 cm on 07/14/2019 and previously 0.9 cm on 10/29/2019. Left internal mammary lymph nodes measure up to 0.5 cm in short axis (image 27/2), previously 0.6 cm on 10/29/2019. Lungs/Pleura: No new nodules are identified. Small subpleural nodules Adderall below 4 mm in diameter generally similar to prior. An index left lower lobe nodule measuring 3 mm in diameter on image 76/3 previously measured the same. Musculoskeletal: Lower cervical fusion. There several tiny sclerotic lesions for example in the right anterior fourth rib on image 63/3, these appear stable and there no compelling findings of osseous metastatic disease in the thorax. Thoracic spondylosis. CT ABDOMEN PELVIS FINDINGS Hepatobiliary: Nodular hepatic contour with prominence of the caudate lobe and left hepatic lobe compatible with hepatic cirrhosis, contour not appreciably changed. Borderline gallbladder wall thickening similar to prior. Pancreas: Unremarkable Spleen: Stable splenomegaly. Adrenals/Urinary Tract: Both adrenal glands appear normal. Left nephrectomy without visible local recurrence along the nephrectomy site. Right renal contour unremarkable. No appreciable urinary tract calculi. Stomach/Bowel: Unremarkable Vascular/Lymphatic: Aortoiliac atherosclerotic vascular disease. Scattered likely reactive lymph nodes along the gastrohepatic ligament, porta hepatis, and retroperitoneum. Aortocaval lymph node 0.9 cm in short axis on image 77/2, stable. Reproductive: Penile implant, reservoir in the left lower pelvis. Other: Trace ascites in the pelvis, perisplenic, perihepatic region, similar to prior. Stable mild mesenteric edema. Musculoskeletal: Unremarkable IMPRESSION: 1. No findings of active malignancy. 2. Further reduction in the size of the right paratracheal lymph node, currently 0.7 cm previously 1.8 cm on 07/14/2019 and previously 0.9 cm on 10/29/2019. 3. Stable small mostly subpleural nodules in the lungs, without new or  enlarging nodule. 4. Other imaging findings of potential clinical significance: Coronary atherosclerosis. Hepatic cirrhosis with portal venous hypertension including splenomegaly and trace ascites. Stable mild mesenteric edema. Borderline gallbladder wall thickening, similar to prior. Left nephrectomy without visible local recurrence. 5. Aortic atherosclerosis. Aortic Atherosclerosis (ICD10-I70.0). Electronically Signed   By: Van Clines M.D.   On: 02/16/2020 08:02     ASSESSMENT & PLAN:  1. Renal cell carcinoma, unspecified laterality ([place])   2. Encounter for antineoplastic immunotherapy   3. Thrombocytopenia ([facility])   4. Elevated TSH   5. Normocytic anemia   6. 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 CT chest abdomen pelvis images were independently reviewed by me and discussed with patient. No progression or new disease is further reduction of the right paratracheal lymph nodes.  Stable small mostly subcu nodules in the.  Chronic changes including Coronary Arthrosclerosis,.  #Neutropenia, ANC has improved to 1.5.  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  Counts are stable. Continue vitamin B12 supplementation daily. Flow cytometry showed no immunophenotypic abnormality detected.  Chronic thrombocytopenia, secondary to cirrhosis.  Platelet counts are stable. #Chronic kidney disease, patient was seen by Dr. Candiss Norse on 12/30/2019 Hydrochlorothiazide dose has been reduced.  Avoid nephrotoxins.  Creatinine slightly higher  #Chronic liver cirrhosis with thrombocytopenia and neutropenia.   #Anemia secondary to chronic kidney disease, hemoglobin stable. #Slightly elevated TSH, and free T4 is normal., likely secondary to immunotherapy.  Continue to monitor. #Grief, support was given. #Port-A-Cath in place, Follow-up 2 week lab MD for assessment evaluation prior to  nivolumab. All questions were answered. The patient knows to call the clinic with any problems questions or concerns.   Earlie Server,  MD, PhD Hematology Oncology [facility] Pager- [phone] 02/19/2020

## 2020-02-19 NOTE — Progress Notes (Signed)
Patient here for follow up. No new concerns voiced.  °

## 2020-03-04 ENCOUNTER — Inpatient Hospital Stay (HOSPITAL_BASED_OUTPATIENT_CLINIC_OR_DEPARTMENT_OTHER): Payer: BC Managed Care – PPO | Admitting: Oncology

## 2020-03-04 ENCOUNTER — Other Ambulatory Visit: Payer: Self-pay

## 2020-03-04 ENCOUNTER — Inpatient Hospital Stay: Payer: BC Managed Care – PPO

## 2020-03-04 ENCOUNTER — Inpatient Hospital Stay: Payer: BC Managed Care – PPO | Attending: Oncology

## 2020-03-04 ENCOUNTER — Encounter: Payer: Self-pay | Admitting: Oncology

## 2020-03-04 VITALS — BP 106/68 | HR 69 | Temp 96.0°F | Resp 18 | Wt 252.5 lb

## 2020-03-04 DIAGNOSIS — D696 Thrombocytopenia, unspecified: Secondary | ICD-10-CM | POA: Diagnosis not present

## 2020-03-04 DIAGNOSIS — G8929 Other chronic pain: Secondary | ICD-10-CM | POA: Diagnosis not present

## 2020-03-04 DIAGNOSIS — D709 Neutropenia, unspecified: Secondary | ICD-10-CM | POA: Diagnosis not present

## 2020-03-04 DIAGNOSIS — Z5112 Encounter for antineoplastic immunotherapy: Secondary | ICD-10-CM | POA: Diagnosis not present

## 2020-03-04 DIAGNOSIS — I700 Atherosclerosis of aorta: Secondary | ICD-10-CM | POA: Insufficient documentation

## 2020-03-04 DIAGNOSIS — I129 Hypertensive chronic kidney disease with stage 1 through stage 4 chronic kidney disease, or unspecified chronic kidney disease: Secondary | ICD-10-CM | POA: Diagnosis not present

## 2020-03-04 DIAGNOSIS — E785 Hyperlipidemia, unspecified: Secondary | ICD-10-CM | POA: Diagnosis not present

## 2020-03-04 DIAGNOSIS — E1122 Type 2 diabetes mellitus with diabetic chronic kidney disease: Secondary | ICD-10-CM | POA: Insufficient documentation

## 2020-03-04 DIAGNOSIS — Z800 Family history of malignant neoplasm of digestive organs: Secondary | ICD-10-CM | POA: Diagnosis not present

## 2020-03-04 DIAGNOSIS — Z87891 Personal history of nicotine dependence: Secondary | ICD-10-CM | POA: Diagnosis not present

## 2020-03-04 DIAGNOSIS — R161 Splenomegaly, not elsewhere classified: Secondary | ICD-10-CM | POA: Diagnosis not present

## 2020-03-04 DIAGNOSIS — Z803 Family history of malignant neoplasm of breast: Secondary | ICD-10-CM | POA: Insufficient documentation

## 2020-03-04 DIAGNOSIS — Z794 Long term (current) use of insulin: Secondary | ICD-10-CM | POA: Diagnosis not present

## 2020-03-04 DIAGNOSIS — K7460 Unspecified cirrhosis of liver: Secondary | ICD-10-CM | POA: Diagnosis not present

## 2020-03-04 DIAGNOSIS — R188 Other ascites: Secondary | ICD-10-CM | POA: Diagnosis not present

## 2020-03-04 DIAGNOSIS — C642 Malignant neoplasm of left kidney, except renal pelvis: Secondary | ICD-10-CM | POA: Diagnosis not present

## 2020-03-04 DIAGNOSIS — D631 Anemia in chronic kidney disease: Secondary | ICD-10-CM | POA: Diagnosis not present

## 2020-03-04 DIAGNOSIS — I2510 Atherosclerotic heart disease of native coronary artery without angina pectoris: Secondary | ICD-10-CM | POA: Diagnosis not present

## 2020-03-04 DIAGNOSIS — C7800 Secondary malignant neoplasm of unspecified lung: Secondary | ICD-10-CM | POA: Insufficient documentation

## 2020-03-04 DIAGNOSIS — Z79899 Other long term (current) drug therapy: Secondary | ICD-10-CM | POA: Diagnosis not present

## 2020-03-04 DIAGNOSIS — M549 Dorsalgia, unspecified: Secondary | ICD-10-CM | POA: Diagnosis not present

## 2020-03-04 DIAGNOSIS — D649 Anemia, unspecified: Secondary | ICD-10-CM

## 2020-03-04 DIAGNOSIS — N189 Chronic kidney disease, unspecified: Secondary | ICD-10-CM | POA: Insufficient documentation

## 2020-03-04 DIAGNOSIS — K766 Portal hypertension: Secondary | ICD-10-CM | POA: Insufficient documentation

## 2020-03-04 DIAGNOSIS — C649 Malignant neoplasm of unspecified kidney, except renal pelvis: Secondary | ICD-10-CM

## 2020-03-04 LAB — CBC WITH DIFFERENTIAL/PLATELET
Abs Immature Granulocytes: 0 10*3/uL (ref 0.00–0.07)
Basophils Absolute: 0 10*3/uL (ref 0.0–0.1)
Basophils Relative: 1 %
Eosinophils Absolute: 0.1 10*3/uL (ref 0.0–0.5)
Eosinophils Relative: 4 %
HCT: 27.8 % — ABNORMAL LOW (ref 39.0–52.0)
Hemoglobin: 9.8 g/dL — ABNORMAL LOW (ref 13.0–17.0)
Immature Granulocytes: 0 %
Lymphocytes Relative: 25 %
Lymphs Abs: 0.5 10*3/uL — ABNORMAL LOW (ref 0.7–4.0)
MCH: 31.7 pg (ref 26.0–34.0)
MCHC: 35.3 g/dL (ref 30.0–36.0)
MCV: 90 fL (ref 80.0–100.0)
Monocytes Absolute: 0.3 10*3/uL (ref 0.1–1.0)
Monocytes Relative: 17 %
Neutro Abs: 1.1 10*3/uL — ABNORMAL LOW (ref 1.7–7.7)
Neutrophils Relative %: 53 %
Platelets: 60 10*3/uL — ABNORMAL LOW (ref 150–400)
RBC: 3.09 MIL/uL — ABNORMAL LOW (ref 4.22–5.81)
RDW: 13.1 % (ref 11.5–15.5)
WBC: 2 10*3/uL — ABNORMAL LOW (ref 4.0–10.5)
nRBC: 0 % (ref 0.0–0.2)

## 2020-03-04 LAB — TSH: TSH: 5.933 u[IU]/mL — ABNORMAL HIGH (ref 0.350–4.500)

## 2020-03-04 LAB — COMPREHENSIVE METABOLIC PANEL
ALT: 25 U/L (ref 0–44)
AST: 35 U/L (ref 15–41)
Albumin: 4.1 g/dL (ref 3.5–5.0)
Alkaline Phosphatase: 108 U/L (ref 38–126)
Anion gap: 10 (ref 5–15)
BUN: 32 mg/dL — ABNORMAL HIGH (ref 6–20)
CO2: 22 mmol/L (ref 22–32)
Calcium: 8.6 mg/dL — ABNORMAL LOW (ref 8.9–10.3)
Chloride: 102 mmol/L (ref 98–111)
Creatinine, Ser: 1.95 mg/dL — ABNORMAL HIGH (ref 0.61–1.24)
GFR calc Af Amer: 43 mL/min — ABNORMAL LOW (ref >60)
GFR calc non Af Amer: 37 mL/min — ABNORMAL LOW (ref >60)
Glucose, Bld: 114 mg/dL — ABNORMAL HIGH (ref 70–99)
Potassium: 4.6 mmol/L (ref 3.5–5.1)
Sodium: 134 mmol/L — ABNORMAL LOW (ref 135–145)
Total Bilirubin: 1 mg/dL (ref 0.3–1.2)
Total Protein: 7.7 g/dL (ref 6.5–8.1)

## 2020-03-04 MED ORDER — HEPARIN SOD (PORK) LOCK FLUSH 100 UNIT/ML IV SOLN
500.0000 [IU] | Freq: Once | INTRAVENOUS | Status: AC
  Administered 2020-03-04: 500 [IU] via INTRAVENOUS
  Filled 2020-03-04: qty 5

## 2020-03-04 MED ORDER — SODIUM CHLORIDE 0.9 % IV SOLN
Freq: Once | INTRAVENOUS | Status: AC
  Filled 2020-03-04: qty 250

## 2020-03-04 MED ORDER — HEPARIN SOD (PORK) LOCK FLUSH 100 UNIT/ML IV SOLN
INTRAVENOUS | Status: AC
  Filled 2020-03-04: qty 5

## 2020-03-04 MED ORDER — SODIUM CHLORIDE 0.9 % IV SOLN
240.0000 mg | Freq: Once | INTRAVENOUS | Status: AC
  Administered 2020-03-04: 240 mg via INTRAVENOUS
  Filled 2020-03-04: qty 24

## 2020-03-04 MED ORDER — SODIUM CHLORIDE 0.9% FLUSH
10.0000 mL | Freq: Once | INTRAVENOUS | Status: AC
  Administered 2020-03-04: 10 mL via INTRAVENOUS
  Filled 2020-03-04: qty 10

## 2020-03-04 NOTE — Progress Notes (Signed)
Hematology/Oncology follow-up [facility] [phone] [phone]   Patient Care Team: Albina Billet, MD as PCP - General (Internal Medicine) Earlie Server, MD as Consulting Physician (Hematology and Oncology)  REFERRING PROVIDER: Albina Billet, MD  CHIEF COMPLAINTS/REASON FOR VISIT:  Follow-up for kidney cancer treatments  HISTORY OF PRESENTING ILLNESS:   Henry Moore is a  60 y.o.  male with PMH listed below was seen in consultation at the request of  Albina Billet, MD  for evaluation of kidney cancer Extensive medical records were reviewed Patient had MRI lumbar spine without contrast done on 07/13/2018 which showed mired lumbar degenerative disease. CT thoracic spine was done on 08/05/2018 which showed 5 cm left renal mass. 09/04/2018 CT abdomen and pelvis with and without contrast showed 5 cm round enhancing solid mass in the left kidney.  No involvement of left renal vein.  No lymphadenopathy Morphologic changes consistent with cirrhosis and the portal hypertension.  Small volume ascites and splenomegaly. . 10/16/2018 Left nephrectomy showed renal cell carcinoma, clear-cell type, nuclear grade 4, tumor extends into the renal vein and renal sinus fat.  Urethral, vascular and or resection margins are negative for tumor pT3a Nx Mx  01/28/2019 patient had another CT chest abdomen pelvis done with contrast Showed interval left nephrectomy.  Scattered tiny pulmonary nodules bilaterally.  Nonspecific.  No other evidence of metastatic disease.  Cirrhosis changes extensive coronary and aortic atherosclerosis  07/14/2019 patient underwent surveillance CT chest abdomen pelvis without contrast Interval progression of pulmonary metastasis with new enlarged right paratracheal lymph node 1.7 cm, previously 0.6 cm one-point since worrisome for metastatic adenopathy.  Multiple progressive pulmonary nodules, index nodule within the lingula measures 1.3 cm, previously  3 mm. Index subpleural nodule within the anterior right upper lobe measuring 1.8 cm, previously 3 cm Index nodule within the post lateral right lower lobe measuring 5 mm, new from previous care Aortic sclerosis.  Three-vessel coronary artery calcification noted.  Cirrhosis changes.  Splenomegaly.  Patient was referred to establish care with medical oncology for further discussion and evaluation of metastatic renal cell carcinoma. Patient reports feeling well at baseline today.  Denies any shortness of breath, cough, hemoptysis, back pain, headache. He quitted smoking 2015.  Not currently actively drinking alcohol as well. Patient has chronic back pain unchanged.  # Liver cirrhosis with small amount of ascites/splenomegaly patient sees gastroenterology Dr. [name].  . He will get ultrasound surveillance due in February 2021 for screening of Frost.  EGD surveillance for Barrett's plan in April 2021.  # Diabetes, on Humalog sliding scale and metformin.  #Family history of cancer and personal history of RCC.  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  Status post ipilimumab and nivolumab treatment.  # 10/29/2019 CT chest abdomen pelvis without contrast images were independently reviewed by me and discussed with patient.   CT showed marked improvement with resolution of many of the pulmonary nodules, prominent reduced size of other nodules.  Considerably reduced size of the right lower paratracheal lymph node now 0.9 cm in diameter. Hepatic cirrhosis with prominent splenomegaly and trace perisplenic ascites. Chronic CT findings includes aortic atherosclerotic vascular disease.  Mild sigmoid colon diverticulosis  # CKD, he establish care with Dr. Candiss Norse for chronic  kidney disease.  Blood pressure medication has been adjusted.  Hydrochlorothiazide decreased to 12.5 mg daily.  INTERVAL HISTORY Henry Moore is a 59 y.o. male who has above history reviewed by me today presents for follow up visit for management of stage IV RCC  Problems and complaints are listed below: Patient has no new complaints today.  Chronic fatigue is at baseline. His mother passed away a few weeks ago.   Review of Systems  Constitutional: Positive for fatigue. Negative for appetite change, chills, fever and unexpected weight change.  HENT:   Negative for hearing loss and voice change.   Eyes: Negative for eye problems and icterus.  Respiratory: Negative for chest tightness, cough and shortness of breath.   Cardiovascular: Negative for chest pain and leg swelling.  Gastrointestinal: Negative for abdominal distention and abdominal pain.  Endocrine: Negative for hot flashes.  Genitourinary: Negative for difficulty urinating, dysuria and frequency.   Musculoskeletal: Negative for arthralgias.       Chronic back pain  Skin: Negative for itching and rash.  Neurological: Negative for light-headedness and numbness.  Hematological: Negative for adenopathy. Does not bruise/bleed easily.  Psychiatric/Behavioral: Negative for confusion.    MEDICAL HISTORY:  Past Medical History:  Diagnosis Date  . Cough    SINUS DRAINAGE CAUSING COUGHING , REPORTS CLEAR MUCOUS   . Diabetes mellitus without complication ([place])   . Family history of breast cancer   . Family history of colon cancer   . Family history of stomach cancer   . HTN (hypertension)   . Hyperlipemia   . Left renal mass   . Platelets decreased ([place])    DENIES UNSUAL BLEEDING   . PONV (postoperative nausea and vomiting)   . Renal cell carcinoma ([place]) 08/04/2019  . WBC decreased     SURGICAL HISTORY: Past Surgical History:  Procedure Laterality Date  . ANKLE SURGERY Left   . ANTERIOR CERVICAL DECOMP/DISCECTOMY FUSION  N/A 04/16/2014   Procedure: ANTERIOR CERVICAL DECOMPRESSION/DISCECTOMY FUSION  (ACDF C5-C7)   (2 LEVELS)     ;  Surgeon: Melina Schools, MD;  Location: [facility];  Service: Orthopedics;  Laterality: N/A;  . APPENDECTOMY    . BACK SURGERY    . ESOPHAGOGASTRODUODENOSCOPY (EGD) WITH PROPOFOL N/A 02/06/2019   Procedure: ESOPHAGOGASTRODUODENOSCOPY (EGD) WITH PROPOFOL;  Surgeon: [name], MD;  Location: [facility];  Service: Gastroenterology;  Laterality: N/A;  . ESOPHAGOGASTRODUODENOSCOPY (EGD) WITH PROPOFOL N/A 03/04/2019   Procedure: ESOPHAGOGASTRODUODENOSCOPY (EGD) WITH PROPOFOL;  Surgeon: Lin Landsman, MD;  Location[facility];  Service: Gastroenterology;  Laterality: N/A;  . ESOPHAGOGASTRODUODENOSCOPY (EGD) WITH PROPOFOL N/A 04/22/2019   Procedure: ESOPHAGOGASTRODUODENOSCOPY (EGD) WITH PROPOFOL;  Surgeon: [name], MD;  Location: [facility] ENDOSCOPY;  Service: Gastroenterology;  Laterality: N/A;  . FRACTURE SURGERY    . HYDROCELE EXCISION    . KNEE ARTHROSCOPY Bilateral   . LAPAROSCOPIC NEPHRECTOMY, HAND ASSISTED Left 10/16/2018   Procedure: LEFT HAND ASSISTED LAPAROSCOPIC NEPHRECTOMY;  Surgeon: Lucas Mallow, MD;  Location: [facility];  Service: Urology;  Laterality: Left;  . NASAL SINUS SURGERY     X 2  . 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 PORTA CATH INSERTION N/A 11/11/2019   Procedure: PORTA CATH INSERTION;  Surgeon: Katha Cabal, MD;  Location: [facility];  Service: Cardiovascular;  Laterality: N/A;  . SHOULDER SURGERY Left     SOCIAL HISTORY: Social History   Socioeconomic History  . Marital status: Married    Spouse name: Not on file  .  Number of children: Not on file  . Years of education: Not on file  . Highest education level: Not on file  Occupational History  . Not on file  Tobacco Use  . Smoking status: Former Smoker    Packs/day: 0.25    Years: 20.00    Pack years: 5.00    Quit date: 2015    Years since quitting: 6.5  . Smokeless  tobacco: Never Used  Vaping Use  . Vaping Use: Never used  Substance and Sexual Activity  . Alcohol use: Not Currently    Alcohol/week: 0.0 standard drinks    Comment: no alchol in 1 year  . Drug use: No  . Sexual activity: Not on file  Other Topics Concern  . Not on file  Social History Narrative  . Not on file   Social Determinants of Health   Financial Resource Strain:   . Difficulty of Paying Living Expenses:   Food Insecurity:   . Worried About Charity fundraiser in the Last Year:   . Arboriculturist in the Last Year:   Transportation Needs:   . Film/video editor (Medical):   Marland Kitchen Lack of Transportation (Non-Medical):   Physical Activity:   . Days of Exercise per Week:   . Minutes of Exercise per Session:   Stress:   . Feeling of Stress :   Social Connections:   . Frequency of Communication with Friends and Family:   . Frequency of Social Gatherings with Friends and Family:   . Attends Religious Services:   . Active Member of Clubs or Organizations:   . Attends Archivist Meetings:   Marland Kitchen Marital Status:   Intimate Partner Violence:   . Fear of Current or Ex-Partner:   . Emotionally Abused:   Marland Kitchen Physically Abused:   . Sexually Abused:     FAMILY HISTORY: Family History  Problem Relation Age of Onset  . Diabetes Mother   . Cancer Mother        neuroendocrine cancer  . Cancer Father        neuroendocrine cancer of meninges  . Cancer Maternal Grandmother        tomach dx 29  . Alzheimer's disease Paternal Grandmother   . Lung cancer Paternal Grandfather   . Lung cancer Maternal Aunt   . Colon cancer Maternal Uncle   . Breast cancer Paternal Aunt        both dx 60s  . Ovarian cancer Other        dx 37  . Breast cancer Cousin     ALLERGIES:  is allergic to codeine and mucinex [guaifenesin er].  MEDICATIONS:  Current Outpatient Medications  Medication Sig Dispense Refill  . ACCU-CHEK AVIVA PLUS test strip CHECK BL00D SUGAR ONCE OR TWICE  DAILY. 100 each PRN  . amLODipine (NORVASC) 10 MG tablet Take 10 mg by mouth daily.    . carvedilol (COREG) 25 MG tablet Take 1 tablet (25 mg total) by mouth 2 (two) times daily with a meal. 180 tablet 3  . Cholecalciferol (VITAMIN D3) 125 MCG (5000 UT) CAPS Take 5,000 Units by mouth daily.    Marland Kitchen glipiZIDE (GLUCOTROL) 5 MG tablet Take 1 tablet (5 mg total) by mouth daily before breakfast. 90 tablet 3  . hydrochlorothiazide (HYDRODIURIL) 25 MG tablet TAKE 1 TABLET DAILY. (Patient taking differently: Take 12.5 mg by mouth daily. ) 90 tablet 0  . insulin lispro (INSULIN LISPRO) 100 UNIT/ML KwikPen Junior Inject into  the skin 3 (three) times daily as needed (Above 250). Sliding scale (Patient not taking: Reported on 02/05/2020)    . losartan (COZAAR) 100 MG tablet Take 1 tablet by mouth daily. (Patient taking differently: Take 100 mg by mouth daily. ) 90 tablet 0  . lovastatin (MEVACOR) 40 MG tablet Take 1 tablet (40 mg total) by mouth at bedtime. 60 tablet 0  . metFORMIN (GLUCOPHAGE-XR) 500 MG 24 hr tablet Take 500 mg by mouth 2 (two) times daily.     . prochlorperazine (COMPAZINE) 10 MG tablet Take 1 tablet (10 mg total) by mouth every 6 (six) hours as needed for nausea or vomiting. 30 tablet 0  . vitamin B-12 (CYANOCOBALAMIN) 1000 MCG tablet Take 1 tablet (1,000 mcg total) by mouth daily. 90 tablet 0   No current facility-administered medications for this visit.   Facility-Administered Medications Ordered in Other Visits  Medication Dose Route Frequency Provider Last Rate Last Admin  . heparin lock flush 100 unit/mL  500 Units Intravenous Once Earlie Server, MD      . sodium chloride flush (NS) 0.9 % injection 10 mL  10 mL Intravenous PRN Earlie Server, MD   10 mL at 01/22/20 0831     PHYSICAL EXAMINATION: ECOG PERFORMANCE STATUS: 1 - Symptomatic but completely ambulatory Vitals:   03/04/20 0853  BP: 106/68  Pulse: 69  Resp: 18  Temp: (!) 96 F (35.6 C)   Filed Weights   03/04/20 0853  Weight:  252 lb 8 oz (114.5 kg)    Physical Exam Constitutional:      General: He is not in acute distress.    Appearance: He is obese.  HENT:     Head: Normocephalic and atraumatic.  Eyes:     General: No scleral icterus. Cardiovascular:     Rate and Rhythm: Normal rate and regular rhythm.     Heart sounds: Normal heart sounds.  Pulmonary:     Effort: Pulmonary effort is normal. No respiratory distress.     Breath sounds: No wheezing.  Abdominal:     General: Bowel sounds are normal. There is no distension.     Palpations: Abdomen is soft.  Musculoskeletal:        General: No deformity. Normal range of motion.     Cervical back: Normal range of motion and neck supple.  Skin:    General: Skin is warm and dry.     Findings: No erythema or rash.  Neurological:     Mental Status: He is alert and oriented to person, place, and time. Mental status is at baseline.     Cranial Nerves: No cranial nerve deficit.     Coordination: Coordination normal.  Psychiatric:        Mood and Affect: Mood normal.     LABORATORY DATA:  I have reviewed the data as listed Lab Results  Component Value Date   WBC 2.0 (L) 02/19/2020   HGB 9.6 (L) 02/19/2020   HCT 27.0 (L) 02/19/2020   MCV 90.0 02/19/2020   PLT 60 (L) 02/19/2020   Recent Labs    01/22/20 0828 02/05/20 0815 02/19/20 0801  NA 137 137 136  K 4.3 4.2 4.3  CL 104 106 108  CO2 23 22 23   GLUCOSE 118* 129* 163*  BUN 20 25* 27*  CREATININE 1.74* 1.78* 1.92*  CALCIUM 8.6* 8.4* 9.0  GFRNONAA 43* 42* 38*  GFRAA 50* 48* 44*  PROT 7.7 7.6 7.7  ALBUMIN 3.9 4.0 4.2  AST 30  27 35  ALT 25 19 27   ALKPHOS 107 96 120  BILITOT 1.0 0.9 0.9   Iron/TIBC/Ferritin/ %Sat    Component Value Date/Time   IRON 52 12/18/2019 0826   IRON 105 10/15/2018 1325   TIBC 284 12/18/2019 0826   TIBC 244 (L) 10/15/2018 1325   FERRITIN 146 12/18/2019 0826   FERRITIN 588 (H) 10/15/2018 1325   IRONPCTSAT 18 12/18/2019 0826   IRONPCTSAT 43 10/15/2018 1325       RADIOGRAPHIC STUDIES: I have personally reviewed the radiological images as listed and agreed with the findings in the report. CT Abdomen Pelvis Wo Contrast  Result Date: 02/16/2020 CLINICAL DATA:  Restaging of metastatic left renal cell carcinoma, prior left nephrectomy. EXAM: CT CHEST, ABDOMEN AND PELVIS WITHOUT CONTRAST TECHNIQUE: Multidetector CT imaging of the chest, abdomen and pelvis was performed following the standard protocol without IV contrast. COMPARISON:  Multiple exams, including 10/29/2019 FINDINGS: CT CHEST FINDINGS Cardiovascular: Right Port-A-Cath tip: Right atrium. Coronary, aortic arch, and branch vessel atherosclerotic vascular disease. Mediastinum/Nodes: Further reduction in the size of the right paratracheal lymph node, currently 0.7 cm previously 1.8 cm on 07/14/2019 and previously 0.9 cm on 10/29/2019. Left internal mammary lymph nodes measure up to 0.5 cm in short axis (image 27/2), previously 0.6 cm on 10/29/2019. Lungs/Pleura: No new nodules are identified. Small subpleural nodules Adderall below 4 mm in diameter generally similar to prior. An index left lower lobe nodule measuring 3 mm in diameter on image 76/3 previously measured the same. Musculoskeletal: Lower cervical fusion. There several tiny sclerotic lesions for example in the right anterior fourth rib on image 63/3, these appear stable and there no compelling findings of osseous metastatic disease in the thorax. Thoracic spondylosis. CT ABDOMEN PELVIS FINDINGS Hepatobiliary: Nodular hepatic contour with prominence of the caudate lobe and left hepatic lobe compatible with hepatic cirrhosis, contour not appreciably changed. Borderline gallbladder wall thickening similar to prior. Pancreas: Unremarkable Spleen: Stable splenomegaly. Adrenals/Urinary Tract: Both adrenal glands appear normal. Left nephrectomy without visible local recurrence along the nephrectomy site. Right renal contour unremarkable. No appreciable  urinary tract calculi. Stomach/Bowel: Unremarkable Vascular/Lymphatic: Aortoiliac atherosclerotic vascular disease. Scattered likely reactive lymph nodes along the gastrohepatic ligament, porta hepatis, and retroperitoneum. Aortocaval lymph node 0.9 cm in short axis on image 77/2, stable. Reproductive: Penile implant, reservoir in the left lower pelvis. Other: Trace ascites in the pelvis, perisplenic, perihepatic region, similar to prior. Stable mild mesenteric edema. Musculoskeletal: Unremarkable IMPRESSION: 1. No findings of active malignancy. 2. Further reduction in the size of the right paratracheal lymph node, currently 0.7 cm previously 1.8 cm on 07/14/2019 and previously 0.9 cm on 10/29/2019. 3. Stable small mostly subpleural nodules in the lungs, without new or enlarging nodule. 4. Other imaging findings of potential clinical significance: Coronary atherosclerosis. Hepatic cirrhosis with portal venous hypertension including splenomegaly and trace ascites. Stable mild mesenteric edema. Borderline gallbladder wall thickening, similar to prior. Left nephrectomy without visible local recurrence. 5. Aortic atherosclerosis. Aortic Atherosclerosis (ICD10-I70.0). Electronically Signed   By: Van Clines M.D.   On: 02/16/2020 08:02   CT Chest Wo Contrast  Result Date: 02/16/2020 CLINICAL DATA:  Restaging of metastatic left renal cell carcinoma, prior left nephrectomy. EXAM: CT CHEST, ABDOMEN AND PELVIS WITHOUT CONTRAST TECHNIQUE: Multidetector CT imaging of the chest, abdomen and pelvis was performed following the standard protocol without IV contrast. COMPARISON:  Multiple exams, including 10/29/2019 FINDINGS: CT CHEST FINDINGS Cardiovascular: Right Port-A-Cath tip: Right atrium. Coronary, aortic arch, and branch vessel atherosclerotic vascular disease.  Mediastinum/Nodes: Further reduction in the size of the right paratracheal lymph node, currently 0.7 cm previously 1.8 cm on 07/14/2019 and previously 0.9  cm on 10/29/2019. Left internal mammary lymph nodes measure up to 0.5 cm in short axis (image 27/2), previously 0.6 cm on 10/29/2019. Lungs/Pleura: No new nodules are identified. Small subpleural nodules Adderall below 4 mm in diameter generally similar to prior. An index left lower lobe nodule measuring 3 mm in diameter on image 76/3 previously measured the same. Musculoskeletal: Lower cervical fusion. There several tiny sclerotic lesions for example in the right anterior fourth rib on image 63/3, these appear stable and there no compelling findings of osseous metastatic disease in the thorax. Thoracic spondylosis. CT ABDOMEN PELVIS FINDINGS Hepatobiliary: Nodular hepatic contour with prominence of the caudate lobe and left hepatic lobe compatible with hepatic cirrhosis, contour not appreciably changed. Borderline gallbladder wall thickening similar to prior. Pancreas: Unremarkable Spleen: Stable splenomegaly. Adrenals/Urinary Tract: Both adrenal glands appear normal. Left nephrectomy without visible local recurrence along the nephrectomy site. Right renal contour unremarkable. No appreciable urinary tract calculi. Stomach/Bowel: Unremarkable Vascular/Lymphatic: Aortoiliac atherosclerotic vascular disease. Scattered likely reactive lymph nodes along the gastrohepatic ligament, porta hepatis, and retroperitoneum. Aortocaval lymph node 0.9 cm in short axis on image 77/2, stable. Reproductive: Penile implant, reservoir in the left lower pelvis. Other: Trace ascites in the pelvis, perisplenic, perihepatic region, similar to prior. Stable mild mesenteric edema. Musculoskeletal: Unremarkable IMPRESSION: 1. No findings of active malignancy. 2. Further reduction in the size of the right paratracheal lymph node, currently 0.7 cm previously 1.8 cm on 07/14/2019 and previously 0.9 cm on 10/29/2019. 3. Stable small mostly subpleural nodules in the lungs, without new or enlarging nodule. 4. Other imaging findings of potential  clinical significance: Coronary atherosclerosis. Hepatic cirrhosis with portal venous hypertension including splenomegaly and trace ascites. Stable mild mesenteric edema. Borderline gallbladder wall thickening, similar to prior. Left nephrectomy without visible local recurrence. 5. Aortic atherosclerosis. Aortic Atherosclerosis (ICD10-I70.0). Electronically Signed   By: Van Clines M.D.   On: 02/16/2020 08:02     ASSESSMENT & PLAN:  1. Renal cell carcinoma of left kidney (HCC)   2. Encounter for antineoplastic immunotherapy   3. Thrombocytopenia ([place])   4. 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 Counts acceptable to proceed with nivolumab today CT chest abdomen pelvis in July 2021 showed stable disease  #Neutropenia, ANC 1.1 chronic leukopenia secondary to cirrhosis.  Counts are stable.  Continue vitamin B12 supplementation.  Chronic thrombocytopenia, secondary to cirrhosis.  Counts are stable. #Chronic kidney disease, avoid nephrotoxins.  Follow-up with nephrologist.  #Chronic liver cirrhosis with thrombocytopenia and neutropenia.   #Anemia secondary to chronic kidney disease, hemoglobin stable. #Grief, support was given. #Port-A-Cath in place, Follow-up 2 week lab MD for assessment evaluation prior to nivolumab. All questions were answered. The patient knows to call the clinic with any problems questions or concerns.   Earlie Server, MD, PhD Hematology Oncology [facility] Pager- [phone] 03/04/2020

## 2020-03-04 NOTE — Progress Notes (Signed)
Patient denies new problems/concerns today.   °

## 2020-03-11 ENCOUNTER — Encounter: Payer: Self-pay | Admitting: Hospice and Palliative Medicine

## 2020-03-11 ENCOUNTER — Inpatient Hospital Stay (HOSPITAL_BASED_OUTPATIENT_CLINIC_OR_DEPARTMENT_OTHER): Payer: BC Managed Care – PPO | Admitting: Hospice and Palliative Medicine

## 2020-03-11 DIAGNOSIS — C642 Malignant neoplasm of left kidney, except renal pelvis: Secondary | ICD-10-CM

## 2020-03-11 DIAGNOSIS — Z515 Encounter for palliative care: Secondary | ICD-10-CM

## 2020-03-11 NOTE — Progress Notes (Signed)
Virtual Visit via Video Note  I connected with Henry Moore on 03/11/20 at  1:00 PM EDT by video and verified that I am speaking with the correct person using two identifiers.   I discussed the limitations, risks, security and privacy concerns of performing an evaluation and management service by telephone and the availability of in person appointments. I also discussed with the patient that there may be a patient responsible charge related to this service. The patient expressed understanding and agreed to proceed.   History of Present Illness: Henry Moore is a 59 y.o. 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    CT 10/29/2019 revealed marked improvement in tumor burden.  Patient was referred to palliative care to help address goals and manage ongoing symptoms.   Observations/Objective: Patient reports that he is doing well. He denies any significant changes or concerns. No symptomatic complaints today. No issues with any medications.  Patient did lose his mother recently to cancer. Emotional support was provided. He reports that he is coping/grieving appropriately.  Assessment and Plan: RCC -on treatment with nivolumab.  Followed by Dr. Tasia Catchings.  Seems to be tolerating treatments well.  Will follow  Follow Up Instructions: Virtual visit in 2 to 3 months   I discussed the assessment and treatment plan with the patient. The patient was provided an opportunity to ask questions and all were answered. The patient agreed with the plan and demonstrated an understanding of the instructions.   The patient was advised to call back or seek an in-person evaluation if the symptoms worsen or if the condition fails to improve as anticipated.  I provided 15 minutes of non-face-to-face time during this encounter.   Irean Hong, NP

## 2020-03-18 ENCOUNTER — Inpatient Hospital Stay: Payer: BC Managed Care – PPO

## 2020-03-18 ENCOUNTER — Encounter: Payer: Self-pay | Admitting: Oncology

## 2020-03-18 ENCOUNTER — Inpatient Hospital Stay (HOSPITAL_BASED_OUTPATIENT_CLINIC_OR_DEPARTMENT_OTHER): Payer: BC Managed Care – PPO | Admitting: Oncology

## 2020-03-18 VITALS — BP 115/73 | HR 74 | Temp 97.4°F | Resp 18 | Wt 256.5 lb

## 2020-03-18 DIAGNOSIS — C642 Malignant neoplasm of left kidney, except renal pelvis: Secondary | ICD-10-CM

## 2020-03-18 DIAGNOSIS — D696 Thrombocytopenia, unspecified: Secondary | ICD-10-CM

## 2020-03-18 DIAGNOSIS — Z5112 Encounter for antineoplastic immunotherapy: Secondary | ICD-10-CM | POA: Diagnosis not present

## 2020-03-18 DIAGNOSIS — D708 Other neutropenia: Secondary | ICD-10-CM

## 2020-03-18 DIAGNOSIS — C649 Malignant neoplasm of unspecified kidney, except renal pelvis: Secondary | ICD-10-CM

## 2020-03-18 DIAGNOSIS — D649 Anemia, unspecified: Secondary | ICD-10-CM

## 2020-03-18 LAB — CBC WITH DIFFERENTIAL/PLATELET
Abs Immature Granulocytes: 0 10*3/uL (ref 0.00–0.07)
Basophils Absolute: 0 10*3/uL (ref 0.0–0.1)
Basophils Relative: 1 %
Eosinophils Absolute: 0.1 10*3/uL (ref 0.0–0.5)
Eosinophils Relative: 3 %
HCT: 27.8 % — ABNORMAL LOW (ref 39.0–52.0)
Hemoglobin: 9.8 g/dL — ABNORMAL LOW (ref 13.0–17.0)
Immature Granulocytes: 0 %
Lymphocytes Relative: 19 %
Lymphs Abs: 0.4 10*3/uL — ABNORMAL LOW (ref 0.7–4.0)
MCH: 32.1 pg (ref 26.0–34.0)
MCHC: 35.3 g/dL (ref 30.0–36.0)
MCV: 91.1 fL (ref 80.0–100.0)
Monocytes Absolute: 0.3 10*3/uL (ref 0.1–1.0)
Monocytes Relative: 12 %
Neutro Abs: 1.4 10*3/uL — ABNORMAL LOW (ref 1.7–7.7)
Neutrophils Relative %: 65 %
Platelets: 64 10*3/uL — ABNORMAL LOW (ref 150–400)
RBC: 3.05 MIL/uL — ABNORMAL LOW (ref 4.22–5.81)
RDW: 13.3 % (ref 11.5–15.5)
WBC: 2.2 10*3/uL — ABNORMAL LOW (ref 4.0–10.5)
nRBC: 0 % (ref 0.0–0.2)

## 2020-03-18 LAB — COMPREHENSIVE METABOLIC PANEL WITH GFR
ALT: 23 U/L (ref 0–44)
AST: 32 U/L (ref 15–41)
Albumin: 4 g/dL (ref 3.5–5.0)
Alkaline Phosphatase: 116 U/L (ref 38–126)
Anion gap: 8 (ref 5–15)
BUN: 32 mg/dL — ABNORMAL HIGH (ref 6–20)
CO2: 21 mmol/L — ABNORMAL LOW (ref 22–32)
Calcium: 8.6 mg/dL — ABNORMAL LOW (ref 8.9–10.3)
Chloride: 108 mmol/L (ref 98–111)
Creatinine, Ser: 1.99 mg/dL — ABNORMAL HIGH (ref 0.61–1.24)
GFR calc Af Amer: 42 mL/min — ABNORMAL LOW
GFR calc non Af Amer: 36 mL/min — ABNORMAL LOW
Glucose, Bld: 129 mg/dL — ABNORMAL HIGH (ref 70–99)
Potassium: 4.6 mmol/L (ref 3.5–5.1)
Sodium: 137 mmol/L (ref 135–145)
Total Bilirubin: 0.7 mg/dL (ref 0.3–1.2)
Total Protein: 7.8 g/dL (ref 6.5–8.1)

## 2020-03-18 MED ORDER — HEPARIN SOD (PORK) LOCK FLUSH 100 UNIT/ML IV SOLN
500.0000 [IU] | Freq: Once | INTRAVENOUS | Status: AC
  Administered 2020-03-18: 500 [IU] via INTRAVENOUS
  Filled 2020-03-18: qty 5

## 2020-03-18 MED ORDER — SODIUM CHLORIDE 0.9 % IV SOLN
240.0000 mg | Freq: Once | INTRAVENOUS | Status: AC
  Administered 2020-03-18: 240 mg via INTRAVENOUS
  Filled 2020-03-18: qty 24

## 2020-03-18 MED ORDER — SODIUM CHLORIDE 0.9% FLUSH
10.0000 mL | Freq: Once | INTRAVENOUS | Status: AC
  Administered 2020-03-18: 10 mL via INTRAVENOUS
  Filled 2020-03-18: qty 10

## 2020-03-18 MED ORDER — HEPARIN SOD (PORK) LOCK FLUSH 100 UNIT/ML IV SOLN
INTRAVENOUS | Status: AC
  Filled 2020-03-18: qty 5

## 2020-03-18 MED ORDER — SODIUM CHLORIDE 0.9 % IV SOLN
Freq: Once | INTRAVENOUS | Status: AC
  Filled 2020-03-18: qty 250

## 2020-03-18 NOTE — Progress Notes (Signed)
Hematology/Oncology follow-up [facility] Telephone:([phone] [phone]   Patient Care Team: Albina Billet, MD as PCP - General (Internal Medicine) Earlie Server, MD as Consulting Physician (Hematology and Oncology)  REFERRING PROVIDER: Albina Billet, MD  CHIEF COMPLAINTS/REASON FOR VISIT:  Follow-up for kidney cancer treatments  HISTORY OF PRESENTING ILLNESS:   Henry [facility] is a  59 y.o.  male with PMH listed below was seen in consultation at the request of  Albina Billet, MD  for evaluation of kidney cancer Extensive medical records were reviewed Patient had MRI lumbar spine without contrast done on 07/13/2018 which showed mired lumbar degenerative disease. CT thoracic spine was done on 08/05/2018 which showed 5 cm left renal mass. 09/04/2018 CT abdomen and pelvis with and without contrast showed 5 cm round enhancing solid mass in the left kidney.  No involvement of left renal vein.  No lymphadenopathy Morphologic changes consistent with cirrhosis and the portal hypertension.  Small volume ascites and splenomegaly. . 10/16/2018 Left nephrectomy showed renal cell carcinoma, clear-cell type, nuclear grade 4, tumor extends into the renal vein and renal sinus fat.  Urethral, vascular and or resection margins are negative for tumor pT3a Nx Mx  01/28/2019 patient had another CT chest abdomen pelvis done with contrast Showed interval left nephrectomy.  Scattered tiny pulmonary nodules bilaterally.  Nonspecific.  No other evidence of metastatic disease.  Cirrhosis changes extensive coronary and aortic atherosclerosis  07/14/2019 patient underwent surveillance CT chest abdomen pelvis without contrast Interval progression of pulmonary metastasis with new enlarged right paratracheal lymph node 1.7 cm, previously 0.6 cm one-point since worrisome for metastatic adenopathy.  Multiple progressive pulmonary nodules, index nodule within the lingula measures 1.3 cm, previously  3 mm. Index subpleural nodule within the anterior right upper lobe measuring 1.8 cm, previously 3 cm Index nodule within the post lateral right lower lobe measuring 5 mm, new from previous care Aortic sclerosis.  Three-vessel coronary artery calcification noted.  Cirrhosis changes.  Splenomegaly.  Patient was referred to establish care with medical oncology for further discussion and evaluation of metastatic renal cell carcinoma. Patient reports feeling well at baseline today.  Denies any shortness of breath, cough, hemoptysis, back pain, headache. He quitted smoking 2015.  Not currently actively drinking alcohol as well. Patient has chronic back pain unchanged.  # Liver cirrhosis with small amount of ascites/splenomegaly patient sees gastroenterology Dr. [name].  . He will get ultrasound surveillance due in February 2021 for screening of [place].  EGD surveillance for Barrett's plan in April 2021.  # Diabetes, on Humalog sliding scale and metformin.  #Family history of cancer and personal history of RCC.  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  Status post ipilimumab and nivolumab treatment.  # 10/29/2019 CT chest abdomen pelvis without contrast images were independently reviewed by me and discussed with patient.   CT showed marked improvement with resolution of many of the pulmonary nodules, prominent reduced size of other nodules.  Considerably reduced size of the right lower paratracheal lymph node now 0.9 cm in diameter. Hepatic cirrhosis with prominent splenomegaly and trace perisplenic ascites. Chronic CT findings includes aortic atherosclerotic vascular disease.  Mild sigmoid colon diverticulosis  # CKD, he establish care with Dr. Candiss Norse for chronic  kidney disease.  Blood pressure medication has been adjusted.  Hydrochlorothiazide decreased to 12.5 mg daily.  INTERVAL HISTORY Henry [facility] is a 59 y.o. male who has above history reviewed by me today presents for follow up visit for management of stage IV RCC  Problems and complaints are listed below: Patient no new complaints.  Denies any shortness of breath, diarrhea or skin rash. He feels that he is mood is getting better.   Review of Systems  Constitutional: Positive for fatigue. Negative for appetite change, chills, fever and unexpected weight change.  HENT:   Negative for hearing loss and voice change.   Eyes: Negative for eye problems and icterus.  Respiratory: Negative for chest tightness, cough and shortness of breath.   Cardiovascular: Negative for chest pain and leg swelling.  Gastrointestinal: Negative for abdominal distention and abdominal pain.  Endocrine: Negative for hot flashes.  Genitourinary: Negative for difficulty urinating, dysuria and frequency.   Musculoskeletal: Negative for arthralgias.       Chronic back pain  Skin: Negative for itching and rash.  Neurological: Negative for light-headedness and numbness.  Hematological: Negative for adenopathy. Does not bruise/bleed easily.  Psychiatric/Behavioral: Negative for confusion.    MEDICAL HISTORY:  Past Medical History:  Diagnosis Date  . Cough    SINUS DRAINAGE CAUSING COUGHING , REPORTS CLEAR MUCOUS   . Diabetes mellitus without complication ([place])   . Family history of breast cancer   . Family history of colon cancer   . Family history of stomach cancer   . HTN (hypertension)   . Hyperlipemia   . Left renal mass   . Platelets decreased ([place])    DENIES UNSUAL BLEEDING   . PONV (postoperative nausea and vomiting)   . Renal cell carcinoma ([place]) 08/04/2019  . WBC decreased     SURGICAL HISTORY: Past Surgical History:  Procedure Laterality Date  . ANKLE SURGERY Left   . ANTERIOR CERVICAL  DECOMP/DISCECTOMY FUSION N/A 04/16/2014   Procedure: ANTERIOR CERVICAL DECOMPRESSION/DISCECTOMY FUSION  (ACDF C5-C7)   (2 LEVELS)     ;  Surgeon: Melina Schools, MD;  Location: [facility];  Service: Orthopedics;  Laterality: N/A;  . APPENDECTOMY    . BACK SURGERY    . ESOPHAGOGASTRODUODENOSCOPY (EGD) WITH PROPOFOL N/A 02/06/2019   Procedure: ESOPHAGOGASTRODUODENOSCOPY (EGD) WITH PROPOFOL;  Surgeon: [name], MD;  Location: [facility] ENDOSCOPY;  Service: Gastroenterology;  Laterality: N/A;  . ESOPHAGOGASTRODUODENOSCOPY (EGD) WITH PROPOFOL N/A 03/04/2019   Procedure: ESOPHAGOGASTRODUODENOSCOPY (EGD) WITH PROPOFOL;  Surgeon: Lin Landsman, MD;  Location: [facility] ENDOSCOPY;  Service: Gastroenterology;  Laterality: N/A;  . ESOPHAGOGASTRODUODENOSCOPY (EGD) WITH PROPOFOL N/A 04/22/2019   Procedure: ESOPHAGOGASTRODUODENOSCOPY (EGD) WITH PROPOFOL;  Surgeon: [name], MD;  Location: [facility] ENDOSCOPY;  Service: Gastroenterology;  Laterality: N/A;  . FRACTURE SURGERY    . HYDROCELE EXCISION    . KNEE ARTHROSCOPY Bilateral   . LAPAROSCOPIC NEPHRECTOMY, HAND ASSISTED Left 10/16/2018   Procedure: LEFT HAND ASSISTED LAPAROSCOPIC NEPHRECTOMY;  Surgeon: Lucas Mallow, MD;  Location: [facility];  Service: Urology;  Laterality: Left;  . NASAL SINUS SURGERY     X 2  . 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 PORTA CATH INSERTION N/A 11/11/2019   Procedure: PORTA CATH INSERTION;  Surgeon: Katha Cabal, MD;  Location: [facility];  Service: Cardiovascular;  Laterality: N/A;  . SHOULDER SURGERY Left     SOCIAL HISTORY: Social History   Socioeconomic History  . Marital status: Married    Spouse name: Not  on file  . Number of children: Not on file  . Years of education: Not on file  . Highest education level: Not on file  Occupational History  . Not on file  Tobacco Use  . Smoking status: Former Smoker    Packs/day: 0.25    Years: 20.00    Pack years: 5.00    Quit date: 2015    Years since  quitting: 6.6  . Smokeless tobacco: Never Used  Vaping Use  . Vaping Use: Never used  Substance and Sexual Activity  . Alcohol use: Not Currently    Alcohol/week: 0.0 standard drinks    Comment: no alchol in 1 year  . Drug use: No  . Sexual activity: Not on file  Other Topics Concern  . Not on file  Social History Narrative  . Not on file   Social Determinants of Health   Financial Resource Strain:   . Difficulty of Paying Living Expenses: Not on file  Food Insecurity:   . Worried About Charity fundraiser in the Last Year: Not on file  . Ran Out of Food in the Last Year: Not on file  Transportation Needs:   . Lack of Transportation (Medical): Not on file  . Lack of Transportation (Non-Medical): Not on file  Physical Activity:   . Days of Exercise per Week: Not on file  . Minutes of Exercise per Session: Not on file  Stress:   . Feeling of Stress : Not on file  Social Connections:   . Frequency of Communication with Friends and Family: Not on file  . Frequency of Social Gatherings with Friends and Family: Not on file  . Attends Religious Services: Not on file  . Active Member of Clubs or Organizations: Not on file  . Attends Archivist Meetings: Not on file  . Marital Status: Not on file  Intimate Partner Violence:   . Fear of Current or Ex-Partner: Not on file  . Emotionally Abused: Not on file  . Physically Abused: Not on file  . Sexually Abused: Not on file    FAMILY HISTORY: Family History  Problem Relation Age of Onset  . Diabetes Mother   . Cancer Mother        neuroendocrine cancer  . Cancer Father        neuroendocrine cancer of meninges  . Cancer Maternal Grandmother        tomach dx 34  . Alzheimer's disease Paternal Grandmother   . Lung cancer Paternal Grandfather   . Lung cancer Maternal Aunt   . Colon cancer Maternal Uncle   . Breast cancer Paternal Aunt        both dx 35s  . Ovarian cancer Other        dx 16  . Breast cancer Cousin      ALLERGIES:  is allergic to codeine and mucinex [guaifenesin er].  MEDICATIONS:  Current Outpatient Medications  Medication Sig Dispense Refill  . ACCU-CHEK AVIVA PLUS test strip CHECK BL00D SUGAR ONCE OR TWICE DAILY. 100 each PRN  . amLODipine (NORVASC) 10 MG tablet Take 10 mg by mouth daily.    . carvedilol (COREG) 25 MG tablet Take 1 tablet (25 mg total) by mouth 2 (two) times daily with a meal. 180 tablet 3  . Cholecalciferol (VITAMIN D3) 125 MCG (5000 UT) CAPS Take 5,000 Units by mouth daily.    Marland Kitchen glipiZIDE (GLUCOTROL) 5 MG tablet Take 1 tablet (5 mg total) by mouth daily before  breakfast. 90 tablet 3  . hydrochlorothiazide (HYDRODIURIL) 25 MG tablet TAKE 1 TABLET DAILY. (Patient taking differently: Take 12.5 mg by mouth daily. ) 90 tablet 0  . insulin lispro (INSULIN LISPRO) 100 UNIT/ML KwikPen Junior Inject into the skin 3 (three) times daily as needed (Above 250). Sliding scale (Patient not taking: Reported on 02/05/2020)    . losartan (COZAAR) 100 MG tablet Take 1 tablet by mouth daily. (Patient taking differently: Take 100 mg by mouth daily. ) 90 tablet 0  . lovastatin (MEVACOR) 40 MG tablet Take 1 tablet (40 mg total) by mouth at bedtime. 60 tablet 0  . metFORMIN (GLUCOPHAGE-XR) 500 MG 24 hr tablet Take 500 mg by mouth 2 (two) times daily.     . prochlorperazine (COMPAZINE) 10 MG tablet Take 1 tablet (10 mg total) by mouth every 6 (six) hours as needed for nausea or vomiting. (Patient not taking: Reported on 03/04/2020) 30 tablet 0  . vitamin B-12 (CYANOCOBALAMIN) 1000 MCG tablet Take 1 tablet (1,000 mcg total) by mouth daily. 90 tablet 0   No current facility-administered medications for this visit.   Facility-Administered Medications Ordered in Other Visits  Medication Dose Route Frequency Provider Last Rate Last Admin  . heparin lock flush 100 unit/mL  500 Units Intravenous Once Earlie Server, MD      . sodium chloride flush (NS) 0.9 % injection 10 mL  10 mL Intravenous PRN Earlie Server, MD   10 mL at 01/22/20 0831  . sodium chloride flush (NS) 0.9 % injection 10 mL  10 mL Intravenous Once Earlie Server, MD         PHYSICAL EXAMINATION: ECOG PERFORMANCE STATUS: 0 - Asymptomatic Vitals:   03/18/20 0845  BP: 115/73  Pulse: 74  Resp: 18  Temp: (!) 97.4 F (36.3 C)  SpO2: 98%   Filed Weights   03/18/20 0845  Weight: 256 lb 8 oz (116.3 kg)    Physical Exam Constitutional:      General: He is not in acute distress.    Appearance: He is obese.  HENT:     Head: Normocephalic and atraumatic.  Eyes:     General: No scleral icterus. Cardiovascular:     Rate and Rhythm: Normal rate and regular rhythm.     Heart sounds: Normal heart sounds.  Pulmonary:     Effort: Pulmonary effort is normal. No respiratory distress.     Breath sounds: No wheezing.  Abdominal:     General: Bowel sounds are normal. There is no distension.     Palpations: Abdomen is soft.  Musculoskeletal:        General: No deformity. Normal range of motion.     Cervical back: Normal range of motion and neck supple.  Skin:    General: Skin is warm and dry.     Findings: No erythema or rash.  Neurological:     Mental Status: He is alert and oriented to person, place, and time. Mental status is at baseline.     Cranial Nerves: No cranial nerve deficit.     Coordination: Coordination normal.  Psychiatric:        Mood and Affect: Mood normal.     LABORATORY DATA:  I have reviewed the data as listed Lab Results  Component Value Date   WBC 2.0 (L) 03/04/2020   HGB 9.8 (L) 03/04/2020   HCT 27.8 (L) 03/04/2020   MCV 90.0 03/04/2020   PLT 60 (L) 03/04/2020   Recent Labs  02/05/20 0815 02/19/20 0801 03/04/20 0835  NA 137 136 134*  K 4.2 4.3 4.6  CL 106 108 102  CO2 22 23 22   GLUCOSE 129* 163* 114*  BUN 25* 27* 32*  CREATININE 1.78* 1.92* 1.95*  CALCIUM 8.4* 9.0 8.6*  GFRNONAA 42* 38* 37*  GFRAA 48* 44* 43*  PROT 7.6 7.7 7.7  ALBUMIN 4.0 4.2 4.1  AST 27 35 35  ALT 19 27 25     ALKPHOS 96 120 108  BILITOT 0.9 0.9 1.0   Iron/TIBC/Ferritin/ %Sat    Component Value Date/Time   IRON 52 12/18/2019 0826   IRON 105 10/15/2018 1325   TIBC 284 12/18/2019 0826   TIBC 244 (L) 10/15/2018 1325   FERRITIN 146 12/18/2019 0826   FERRITIN 588 (H) 10/15/2018 1325   IRONPCTSAT 18 12/18/2019 0826   IRONPCTSAT 43 10/15/2018 1325      RADIOGRAPHIC STUDIES: I have personally reviewed the radiological images as listed and agreed with the findings in the report. CT Abdomen Pelvis Wo Contrast  Result Date: 02/16/2020 CLINICAL DATA:  Restaging of metastatic left renal cell carcinoma, prior left nephrectomy. EXAM: CT CHEST, ABDOMEN AND PELVIS WITHOUT CONTRAST TECHNIQUE: Multidetector CT imaging of the chest, abdomen and pelvis was performed following the standard protocol without IV contrast. COMPARISON:  Multiple exams, including 10/29/2019 FINDINGS: CT CHEST FINDINGS Cardiovascular: Right Port-A-Cath tip: Right atrium. Coronary, aortic arch, and branch vessel atherosclerotic vascular disease. Mediastinum/Nodes: Further reduction in the size of the right paratracheal lymph node, currently 0.7 cm previously 1.8 cm on 07/14/2019 and previously 0.9 cm on 10/29/2019. Left internal mammary lymph nodes measure up to 0.5 cm in short axis (image 27/2), previously 0.6 cm on 10/29/2019. Lungs/Pleura: No new nodules are identified. Small subpleural nodules Adderall below 4 mm in diameter generally similar to prior. An index left lower lobe nodule measuring 3 mm in diameter on image 76/3 previously measured the same. Musculoskeletal: Lower cervical fusion. There several tiny sclerotic lesions for example in the right anterior fourth rib on image 63/3, these appear stable and there no compelling findings of osseous metastatic disease in the thorax. Thoracic spondylosis. CT ABDOMEN PELVIS FINDINGS Hepatobiliary: Nodular hepatic contour with prominence of the caudate lobe and left hepatic lobe compatible  with hepatic cirrhosis, contour not appreciably changed. Borderline gallbladder wall thickening similar to prior. Pancreas: Unremarkable Spleen: Stable splenomegaly. Adrenals/Urinary Tract: Both adrenal glands appear normal. Left nephrectomy without visible local recurrence along the nephrectomy site. Right renal contour unremarkable. No appreciable urinary tract calculi. Stomach/Bowel: Unremarkable Vascular/Lymphatic: Aortoiliac atherosclerotic vascular disease. Scattered likely reactive lymph nodes along the gastrohepatic ligament, porta hepatis, and retroperitoneum. Aortocaval lymph node 0.9 cm in short axis on image 77/2, stable. Reproductive: Penile implant, reservoir in the left lower pelvis. Other: Trace ascites in the pelvis, perisplenic, perihepatic region, similar to prior. Stable mild mesenteric edema. Musculoskeletal: Unremarkable IMPRESSION: 1. No findings of active malignancy. 2. Further reduction in the size of the right paratracheal lymph node, currently 0.7 cm previously 1.8 cm on 07/14/2019 and previously 0.9 cm on 10/29/2019. 3. Stable small mostly subpleural nodules in the lungs, without new or enlarging nodule. 4. Other imaging findings of potential clinical significance: Coronary atherosclerosis. Hepatic cirrhosis with portal venous hypertension including splenomegaly and trace ascites. Stable mild mesenteric edema. Borderline gallbladder wall thickening, similar to prior. Left nephrectomy without visible local recurrence. 5. Aortic atherosclerosis. Aortic Atherosclerosis (ICD10-I70.0). Electronically Signed   By: Van Clines M.D.   On: 02/16/2020 08:02   CT Chest Wo  Contrast  Result Date: 02/16/2020 CLINICAL DATA:  Restaging of metastatic left renal cell carcinoma, prior left nephrectomy. EXAM: CT CHEST, ABDOMEN AND PELVIS WITHOUT CONTRAST TECHNIQUE: Multidetector CT imaging of the chest, abdomen and pelvis was performed following the standard protocol without IV contrast.  COMPARISON:  Multiple exams, including 10/29/2019 FINDINGS: CT CHEST FINDINGS Cardiovascular: Right Port-A-Cath tip: Right atrium. Coronary, aortic arch, and branch vessel atherosclerotic vascular disease. Mediastinum/Nodes: Further reduction in the size of the right paratracheal lymph node, currently 0.7 cm previously 1.8 cm on 07/14/2019 and previously 0.9 cm on 10/29/2019. Left internal mammary lymph nodes measure up to 0.5 cm in short axis (image 27/2), previously 0.6 cm on 10/29/2019. Lungs/Pleura: No new nodules are identified. Small subpleural nodules Adderall below 4 mm in diameter generally similar to prior. An index left lower lobe nodule measuring 3 mm in diameter on image 76/3 previously measured the same. Musculoskeletal: Lower cervical fusion. There several tiny sclerotic lesions for example in the right anterior fourth rib on image 63/3, these appear stable and there no compelling findings of osseous metastatic disease in the thorax. Thoracic spondylosis. CT ABDOMEN PELVIS FINDINGS Hepatobiliary: Nodular hepatic contour with prominence of the caudate lobe and left hepatic lobe compatible with hepatic cirrhosis, contour not appreciably changed. Borderline gallbladder wall thickening similar to prior. Pancreas: Unremarkable Spleen: Stable splenomegaly. Adrenals/Urinary Tract: Both adrenal glands appear normal. Left nephrectomy without visible local recurrence along the nephrectomy site. Right renal contour unremarkable. No appreciable urinary tract calculi. Stomach/Bowel: Unremarkable Vascular/Lymphatic: Aortoiliac atherosclerotic vascular disease. Scattered likely reactive lymph nodes along the gastrohepatic ligament, porta hepatis, and retroperitoneum. Aortocaval lymph node 0.9 cm in short axis on image 77/2, stable. Reproductive: Penile implant, reservoir in the left lower pelvis. Other: Trace ascites in the pelvis, perisplenic, perihepatic region, similar to prior. Stable mild mesenteric edema.  Musculoskeletal: Unremarkable IMPRESSION: 1. No findings of active malignancy. 2. Further reduction in the size of the right paratracheal lymph node, currently 0.7 cm previously 1.8 cm on 07/14/2019 and previously 0.9 cm on 10/29/2019. 3. Stable small mostly subpleural nodules in the lungs, without new or enlarging nodule. 4. Other imaging findings of potential clinical significance: Coronary atherosclerosis. Hepatic cirrhosis with portal venous hypertension including splenomegaly and trace ascites. Stable mild mesenteric edema. Borderline gallbladder wall thickening, similar to prior. Left nephrectomy without visible local recurrence. 5. Aortic atherosclerosis. Aortic Atherosclerosis (ICD10-I70.0). Electronically Signed   By: Van Clines M.D.   On: 02/16/2020 08:02     ASSESSMENT & PLAN:  1. Renal cell carcinoma of left kidney (HCC)   2. Encounter for antineoplastic immunotherapy   3. Thrombocytopenia ([place])   4. Normocytic anemia   5. 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 Stable counts to proceed with immunotherapy.   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  Stable counts.  Advised patient to continue vitamin B12 supplementation. Chronic thrombocytopenia, secondary to cirrhosis.  Stable counts. #Chronic kidney disease, avoid nephrotoxins.  Creatinine 1.99.  Stable.  #Chronic liver cirrhosis with thrombocytopenia and neutropenia.   #Anemia secondary to chronic kidney disease, hemoglobin stable.  #Port-A-Cath in place, Follow-up 2 week lab MD for assessment evaluation prior to nivolumab. All questions were answered. The patient knows to call the clinic with any problems questions or concerns.   Earlie Server, MD, PhD Hematology Oncology [facility] Pager- [phone] 03/18/2020

## 2020-03-18 NOTE — Progress Notes (Signed)
Pt here for follow up. No new concerns voiced.   

## 2020-04-01 ENCOUNTER — Other Ambulatory Visit: Payer: Self-pay

## 2020-04-01 ENCOUNTER — Inpatient Hospital Stay: Payer: BC Managed Care – PPO | Attending: Oncology

## 2020-04-01 ENCOUNTER — Encounter: Payer: Self-pay | Admitting: Oncology

## 2020-04-01 ENCOUNTER — Inpatient Hospital Stay: Payer: BC Managed Care – PPO

## 2020-04-01 ENCOUNTER — Inpatient Hospital Stay (HOSPITAL_BASED_OUTPATIENT_CLINIC_OR_DEPARTMENT_OTHER): Payer: BC Managed Care – PPO | Admitting: Oncology

## 2020-04-01 VITALS — BP 112/67 | HR 71 | Temp 96.5°F | Resp 18 | Wt 254.8 lb

## 2020-04-01 VITALS — BP 121/74 | HR 69 | Resp 16

## 2020-04-01 DIAGNOSIS — Z800 Family history of malignant neoplasm of digestive organs: Secondary | ICD-10-CM | POA: Insufficient documentation

## 2020-04-01 DIAGNOSIS — D509 Iron deficiency anemia, unspecified: Secondary | ICD-10-CM | POA: Insufficient documentation

## 2020-04-01 DIAGNOSIS — K7460 Unspecified cirrhosis of liver: Secondary | ICD-10-CM | POA: Diagnosis not present

## 2020-04-01 DIAGNOSIS — D649 Anemia, unspecified: Secondary | ICD-10-CM

## 2020-04-01 DIAGNOSIS — Z803 Family history of malignant neoplasm of breast: Secondary | ICD-10-CM | POA: Insufficient documentation

## 2020-04-01 DIAGNOSIS — D709 Neutropenia, unspecified: Secondary | ICD-10-CM | POA: Insufficient documentation

## 2020-04-01 DIAGNOSIS — C7800 Secondary malignant neoplasm of unspecified lung: Secondary | ICD-10-CM | POA: Insufficient documentation

## 2020-04-01 DIAGNOSIS — E119 Type 2 diabetes mellitus without complications: Secondary | ICD-10-CM | POA: Diagnosis not present

## 2020-04-01 DIAGNOSIS — C642 Malignant neoplasm of left kidney, except renal pelvis: Secondary | ICD-10-CM

## 2020-04-01 DIAGNOSIS — Z794 Long term (current) use of insulin: Secondary | ICD-10-CM | POA: Diagnosis not present

## 2020-04-01 DIAGNOSIS — D696 Thrombocytopenia, unspecified: Secondary | ICD-10-CM | POA: Diagnosis not present

## 2020-04-01 DIAGNOSIS — Z87891 Personal history of nicotine dependence: Secondary | ICD-10-CM | POA: Diagnosis not present

## 2020-04-01 DIAGNOSIS — I2510 Atherosclerotic heart disease of native coronary artery without angina pectoris: Secondary | ICD-10-CM | POA: Insufficient documentation

## 2020-04-01 DIAGNOSIS — Z5112 Encounter for antineoplastic immunotherapy: Secondary | ICD-10-CM | POA: Diagnosis not present

## 2020-04-01 DIAGNOSIS — E785 Hyperlipidemia, unspecified: Secondary | ICD-10-CM | POA: Diagnosis not present

## 2020-04-01 DIAGNOSIS — R599 Enlarged lymph nodes, unspecified: Secondary | ICD-10-CM | POA: Insufficient documentation

## 2020-04-01 DIAGNOSIS — Z79899 Other long term (current) drug therapy: Secondary | ICD-10-CM | POA: Insufficient documentation

## 2020-04-01 DIAGNOSIS — I700 Atherosclerosis of aorta: Secondary | ICD-10-CM | POA: Insufficient documentation

## 2020-04-01 DIAGNOSIS — M5136 Other intervertebral disc degeneration, lumbar region: Secondary | ICD-10-CM | POA: Diagnosis not present

## 2020-04-01 DIAGNOSIS — I129 Hypertensive chronic kidney disease with stage 1 through stage 4 chronic kidney disease, or unspecified chronic kidney disease: Secondary | ICD-10-CM | POA: Diagnosis not present

## 2020-04-01 DIAGNOSIS — I959 Hypotension, unspecified: Secondary | ICD-10-CM | POA: Diagnosis not present

## 2020-04-01 DIAGNOSIS — G8929 Other chronic pain: Secondary | ICD-10-CM | POA: Diagnosis not present

## 2020-04-01 DIAGNOSIS — N189 Chronic kidney disease, unspecified: Secondary | ICD-10-CM | POA: Insufficient documentation

## 2020-04-01 DIAGNOSIS — R161 Splenomegaly, not elsewhere classified: Secondary | ICD-10-CM | POA: Diagnosis not present

## 2020-04-01 DIAGNOSIS — C649 Malignant neoplasm of unspecified kidney, except renal pelvis: Secondary | ICD-10-CM

## 2020-04-01 LAB — CBC WITH DIFFERENTIAL/PLATELET
Abs Immature Granulocytes: 0.01 10*3/uL (ref 0.00–0.07)
Basophils Absolute: 0 10*3/uL (ref 0.0–0.1)
Basophils Relative: 1 %
Eosinophils Absolute: 0.1 10*3/uL (ref 0.0–0.5)
Eosinophils Relative: 5 %
HCT: 27.9 % — ABNORMAL LOW (ref 39.0–52.0)
Hemoglobin: 9.8 g/dL — ABNORMAL LOW (ref 13.0–17.0)
Immature Granulocytes: 0 %
Lymphocytes Relative: 19 %
Lymphs Abs: 0.4 10*3/uL — ABNORMAL LOW (ref 0.7–4.0)
MCH: 31.6 pg (ref 26.0–34.0)
MCHC: 35.1 g/dL (ref 30.0–36.0)
MCV: 90 fL (ref 80.0–100.0)
Monocytes Absolute: 0.3 10*3/uL (ref 0.1–1.0)
Monocytes Relative: 12 %
Neutro Abs: 1.5 10*3/uL — ABNORMAL LOW (ref 1.7–7.7)
Neutrophils Relative %: 63 %
Platelets: 69 10*3/uL — ABNORMAL LOW (ref 150–400)
RBC: 3.1 MIL/uL — ABNORMAL LOW (ref 4.22–5.81)
RDW: 13.5 % (ref 11.5–15.5)
WBC: 2.4 10*3/uL — ABNORMAL LOW (ref 4.0–10.5)
nRBC: 0 % (ref 0.0–0.2)

## 2020-04-01 LAB — COMPREHENSIVE METABOLIC PANEL
ALT: 24 U/L (ref 0–44)
AST: 31 U/L (ref 15–41)
Albumin: 4 g/dL (ref 3.5–5.0)
Alkaline Phosphatase: 102 U/L (ref 38–126)
Anion gap: 11 (ref 5–15)
BUN: 24 mg/dL — ABNORMAL HIGH (ref 6–20)
CO2: 21 mmol/L — ABNORMAL LOW (ref 22–32)
Calcium: 8.5 mg/dL — ABNORMAL LOW (ref 8.9–10.3)
Chloride: 107 mmol/L (ref 98–111)
Creatinine, Ser: 1.69 mg/dL — ABNORMAL HIGH (ref 0.61–1.24)
GFR calc Af Amer: 51 mL/min — ABNORMAL LOW (ref >60)
GFR calc non Af Amer: 44 mL/min — ABNORMAL LOW (ref >60)
Glucose, Bld: 123 mg/dL — ABNORMAL HIGH (ref 70–99)
Potassium: 4.3 mmol/L (ref 3.5–5.1)
Sodium: 139 mmol/L (ref 135–145)
Total Bilirubin: 0.9 mg/dL (ref 0.3–1.2)
Total Protein: 7.9 g/dL (ref 6.5–8.1)

## 2020-04-01 LAB — TSH: TSH: 3.848 u[IU]/mL (ref 0.350–4.500)

## 2020-04-01 MED ORDER — SODIUM CHLORIDE 0.9 % IV SOLN
240.0000 mg | Freq: Once | INTRAVENOUS | Status: AC
  Administered 2020-04-01: 240 mg via INTRAVENOUS
  Filled 2020-04-01: qty 24

## 2020-04-01 MED ORDER — HEPARIN SOD (PORK) LOCK FLUSH 100 UNIT/ML IV SOLN
500.0000 [IU] | Freq: Once | INTRAVENOUS | Status: AC | PRN
  Administered 2020-04-01: 500 [IU]
  Filled 2020-04-01: qty 5

## 2020-04-01 MED ORDER — SODIUM CHLORIDE 0.9 % IV SOLN
Freq: Once | INTRAVENOUS | Status: AC
  Filled 2020-04-01: qty 250

## 2020-04-01 MED ORDER — HEPARIN SOD (PORK) LOCK FLUSH 100 UNIT/ML IV SOLN
INTRAVENOUS | Status: AC
  Filled 2020-04-01: qty 5

## 2020-04-01 NOTE — Progress Notes (Signed)
Pt here for follow up. No new concerns voiced.   

## 2020-04-01 NOTE — Progress Notes (Signed)
Hematology/Oncology follow-up [facility] [phone] [phone]   Patient Care Team: Albina Billet, MD as PCP - General (Internal Medicine) Earlie Server, MD as Consulting Physician (Hematology and Oncology)  REFERRING PROVIDER: Albina Billet, MD  CHIEF COMPLAINTS/REASON FOR VISIT:  Follow-up for kidney cancer treatments  HISTORY OF PRESENTING ILLNESS:   Henry Moore is a  60 y.o.  male with PMH listed below was seen in consultation at the request of  Albina Billet, MD  for evaluation of kidney cancer Extensive medical records were reviewed Patient had MRI lumbar spine without contrast done on 07/13/2018 which showed mired lumbar degenerative disease. CT thoracic spine was done on 08/05/2018 which showed 5 cm left renal mass. 09/04/2018 CT abdomen and pelvis with and without contrast showed 5 cm round enhancing solid mass in the left kidney.  No involvement of left renal vein.  No lymphadenopathy Morphologic changes consistent with cirrhosis and the portal hypertension.  Small volume ascites and splenomegaly. . 10/16/2018 Left nephrectomy showed renal cell carcinoma, clear-cell type, nuclear grade 4, tumor extends into the renal vein and renal sinus fat.  Urethral, vascular and or resection margins are negative for tumor pT3a Nx Mx  01/28/2019 patient had another CT chest abdomen pelvis done with contrast Showed interval left nephrectomy.  Scattered tiny pulmonary nodules bilaterally.  Nonspecific.  No other evidence of metastatic disease.  Cirrhosis changes extensive coronary and aortic atherosclerosis  07/14/2019 patient underwent surveillance CT chest abdomen pelvis without contrast Interval progression of pulmonary metastasis with new enlarged right paratracheal lymph node 1.7 cm, previously 0.6 cm one-point since worrisome for metastatic adenopathy.  Multiple progressive pulmonary nodules, index nodule within the lingula measures 1.3 cm, previously  3 mm. Index subpleural nodule within the anterior right upper lobe measuring 1.8 cm, previously 3 cm Index nodule within the post lateral right lower lobe measuring 5 mm, new from previous care Aortic sclerosis.  Three-vessel coronary artery calcification noted.  Cirrhosis changes.  Splenomegaly.  Patient was referred to establish care with medical oncology for further discussion and evaluation of metastatic renal cell carcinoma. Patient reports feeling well at baseline today.  Denies any shortness of breath, cough, hemoptysis, back pain, headache. He quitted smoking 2015.  Not currently actively drinking alcohol as well. Patient has chronic back pain unchanged.  # Liver cirrhosis with small amount of ascites/splenomegaly patient sees gastroenterology Dr. [name].  . He will get ultrasound surveillance due in February 2021 for screening of Frost.  EGD surveillance for Barrett's plan in April 2021.  # Diabetes, on Humalog sliding scale and metformin.  #Family history of cancer and personal history of RCC.  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  Status post ipilimumab and nivolumab treatment.  # 10/29/2019 CT chest abdomen pelvis without contrast images were independently reviewed by me and discussed with patient.   CT showed marked improvement with resolution of many of the pulmonary nodules, prominent reduced size of other nodules.  Considerably reduced size of the right lower paratracheal lymph node now 0.9 cm in diameter. Hepatic cirrhosis with prominent splenomegaly and trace perisplenic ascites. Chronic CT findings includes aortic atherosclerotic vascular disease.  Mild sigmoid colon diverticulosis  # CKD, he establish care with Dr. Candiss Norse for chronic  kidney disease.  Blood pressure medication has been adjusted.  Hydrochlorothiazide decreased to 12.5 mg daily.  INTERVAL HISTORY Henry Moore is a 59 y.o. male who has above history reviewed by me today presents for follow up visit for management of stage IV RCC  Problems and complaints are listed below: Patient no new complaints.  Some fatigue after preparing his granddaughter's birthday party this past weekend.  No skin rash, sob, cough, diarrhea.   Review of Systems  Constitutional: Positive for fatigue. Negative for appetite change, chills, fever and unexpected weight change.  HENT:   Negative for hearing loss and voice change.   Eyes: Negative for eye problems and icterus.  Respiratory: Negative for chest tightness, cough and shortness of breath.   Cardiovascular: Negative for chest pain and leg swelling.  Gastrointestinal: Negative for abdominal distention and abdominal pain.  Endocrine: Negative for hot flashes.  Genitourinary: Negative for difficulty urinating, dysuria and frequency.   Musculoskeletal: Negative for arthralgias.       Chronic back pain  Skin: Negative for itching and rash.  Neurological: Negative for light-headedness and numbness.  Hematological: Negative for adenopathy. Does not bruise/bleed easily.  Psychiatric/Behavioral: Negative for confusion.    MEDICAL HISTORY:  Past Medical History:  Diagnosis Date  . Cough    SINUS DRAINAGE CAUSING COUGHING , REPORTS CLEAR MUCOUS   . Diabetes mellitus without complication ([place])   . Family history of breast cancer   . Family history of colon cancer   . Family history of stomach cancer   . HTN (hypertension)   . Hyperlipemia   . Left renal mass   . Platelets decreased ([place])    DENIES UNSUAL BLEEDING   . PONV (postoperative nausea and vomiting)   . Renal cell carcinoma ([place]) 08/04/2019  . WBC decreased     SURGICAL HISTORY: Past Surgical History:  Procedure Laterality Date  . ANKLE SURGERY Left   . ANTERIOR  CERVICAL DECOMP/DISCECTOMY FUSION N/A 04/16/2014   Procedure: ANTERIOR CERVICAL DECOMPRESSION/DISCECTOMY FUSION  (ACDF C5-C7)   (2 LEVELS)     ;  Surgeon: Melina Schools, MD;  Location: [place];  Service: Orthopedics;  Laterality: N/A;  . APPENDECTOMY    . BACK SURGERY    . ESOPHAGOGASTRODUODENOSCOPY (EGD) WITH PROPOFOL N/A 02/06/2019   Procedure: ESOPHAGOGASTRODUODENOSCOPY (EGD) WITH PROPOFOL;  Surgeon: [name], MD;  Location: [facility] ENDOSCOPY;  Service: Gastroenterology;  Laterality: N/A;  . ESOPHAGOGASTRODUODENOSCOPY (EGD) WITH PROPOFOL N/A 03/04/2019   Procedure: ESOPHAGOGASTRODUODENOSCOPY (EGD) WITH PROPOFOL;  Surgeon: Lin Landsman, MD;  Location: [facility] ENDOSCOPY;  Service: Gastroenterology;  Laterality: N/A;  . ESOPHAGOGASTRODUODENOSCOPY (EGD) WITH PROPOFOL N/A 04/22/2019   Procedure: ESOPHAGOGASTRODUODENOSCOPY (EGD) WITH PROPOFOL;  Surgeon: [name], MD;  Location: [facility] ENDOSCOPY;  Service: Gastroenterology;  Laterality: N/A;  . FRACTURE SURGERY    . HYDROCELE EXCISION    . KNEE ARTHROSCOPY Bilateral   . LAPAROSCOPIC NEPHRECTOMY, HAND ASSISTED Left 10/16/2018   Procedure: LEFT HAND ASSISTED LAPAROSCOPIC NEPHRECTOMY;  Surgeon: Lucas Mallow, MD;  Location: [facility];  Service: Urology;  Laterality: Left;  . NASAL SINUS SURGERY     X 2  . 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 PORTA CATH INSERTION N/A 11/11/2019   Procedure: PORTA CATH INSERTION;  Surgeon: Katha Cabal, MD;  Location: [place] CV LAB;  Service: Cardiovascular;  Laterality: N/A;  . SHOULDER SURGERY Left     SOCIAL HISTORY: Social History   Socioeconomic History  . Marital status: Married    Spouse name: Not  on file  . Number of children: Not on file  . Years of education: Not on file  . Highest education level: Not on file  Occupational History  . Not on file  Tobacco Use  . Smoking status: Former Smoker    Packs/day: 0.25    Years: 20.00    Pack years: 5.00    Quit date: 2015    Years  since quitting: 6.6  . Smokeless tobacco: Never Used  Vaping Use  . Vaping Use: Never used  Substance and Sexual Activity  . Alcohol use: Not Currently    Alcohol/week: 0.0 standard drinks    Comment: no alchol in 1 year  . Drug use: No  . Sexual activity: Not on file  Other Topics Concern  . Not on file  Social History Narrative  . Not on file   Social Determinants of Health   Financial Resource Strain:   . Difficulty of Paying Living Expenses: Not on file  Food Insecurity:   . Worried About Charity fundraiser in the Last Year: Not on file  . Ran Out of Food in the Last Year: Not on file  Transportation Needs:   . Lack of Transportation (Medical): Not on file  . Lack of Transportation (Non-Medical): Not on file  Physical Activity:   . Days of Exercise per Week: Not on file  . Minutes of Exercise per Session: Not on file  Stress:   . Feeling of Stress : Not on file  Social Connections:   . Frequency of Communication with Friends and Family: Not on file  . Frequency of Social Gatherings with Friends and Family: Not on file  . Attends Religious Services: Not on file  . Active Member of Clubs or Organizations: Not on file  . Attends Archivist Meetings: Not on file  . Marital Status: Not on file  Intimate Partner Violence:   . Fear of Current or Ex-Partner: Not on file  . Emotionally Abused: Not on file  . Physically Abused: Not on file  . Sexually Abused: Not on file    FAMILY HISTORY: Family History  Problem Relation Age of Onset  . Diabetes Mother   . Cancer Mother        neuroendocrine cancer  . Cancer Father        neuroendocrine cancer of meninges  . Cancer Maternal Grandmother        tomach dx 15  . Alzheimer's disease Paternal Grandmother   . Lung cancer Paternal Grandfather   . Lung cancer Maternal Aunt   . Colon cancer Maternal Uncle   . Breast cancer Paternal Aunt        both dx 20s  . Ovarian cancer Other        dx 34  . Breast cancer  Cousin     ALLERGIES:  is allergic to codeine and mucinex [guaifenesin er].  MEDICATIONS:  Current Outpatient Medications  Medication Sig Dispense Refill  . ACCU-CHEK AVIVA PLUS test strip CHECK BL00D SUGAR ONCE OR TWICE DAILY. 100 each PRN  . amLODipine (NORVASC) 10 MG tablet Take 10 mg by mouth daily.    . carvedilol (COREG) 25 MG tablet Take 1 tablet (25 mg total) by mouth 2 (two) times daily with a meal. 180 tablet 3  . Cholecalciferol (VITAMIN D3) 125 MCG (5000 UT) CAPS Take 5,000 Units by mouth daily.    Marland Kitchen glipiZIDE (GLUCOTROL) 5 MG tablet Take 1 tablet (5 mg total) by mouth daily before  breakfast. 90 tablet 3  . hydrochlorothiazide (HYDRODIURIL) 25 MG tablet TAKE 1 TABLET DAILY. (Patient taking differently: Take 12.5 mg by mouth daily. ) 90 tablet 0  . insulin lispro (INSULIN LISPRO) 100 UNIT/ML KwikPen Junior Inject into the skin 3 (three) times daily as needed (Above 250). Sliding scale (Patient not taking: Reported on 02/05/2020)    . losartan (COZAAR) 100 MG tablet Take 1 tablet by mouth daily. (Patient taking differently: Take 100 mg by mouth daily. ) 90 tablet 0  . lovastatin (MEVACOR) 40 MG tablet Take 1 tablet (40 mg total) by mouth at bedtime. 60 tablet 0  . metFORMIN (GLUCOPHAGE-XR) 500 MG 24 hr tablet Take 500 mg by mouth 2 (two) times daily.     . prochlorperazine (COMPAZINE) 10 MG tablet Take 1 tablet (10 mg total) by mouth every 6 (six) hours as needed for nausea or vomiting. 30 tablet 0  . vitamin B-12 (CYANOCOBALAMIN) 1000 MCG tablet Take 1 tablet (1,000 mcg total) by mouth daily. 90 tablet 0   No current facility-administered medications for this visit.   Facility-Administered Medications Ordered in Other Visits  Medication Dose Route Frequency Provider Last Rate Last Admin  . sodium chloride flush (NS) 0.9 % injection 10 mL  10 mL Intravenous PRN Earlie Server, MD   10 mL at 01/22/20 0831     PHYSICAL EXAMINATION: ECOG PERFORMANCE STATUS: 0 - Asymptomatic Vitals:    04/01/20 0850  BP: 112/67  Pulse: 71  Resp: 18  Temp: (!) 96.5 F (35.8 C)   Filed Weights   04/01/20 0850  Weight: 254 lb 12.8 oz (115.6 kg)    Physical Exam Constitutional:      General: He is not in acute distress.    Appearance: He is obese.  HENT:     Head: Normocephalic and atraumatic.  Eyes:     General: No scleral icterus. Cardiovascular:     Rate and Rhythm: Normal rate and regular rhythm.     Heart sounds: Normal heart sounds.  Pulmonary:     Effort: Pulmonary effort is normal. No respiratory distress.     Breath sounds: No wheezing.  Abdominal:     General: Bowel sounds are normal. There is no distension.     Palpations: Abdomen is soft.  Musculoskeletal:        General: No deformity. Normal range of motion.     Cervical back: Normal range of motion and neck supple.  Skin:    General: Skin is warm and dry.     Findings: No erythema or rash.  Neurological:     Mental Status: He is alert and oriented to person, place, and time. Mental status is at baseline.     Cranial Nerves: No cranial nerve deficit.     Coordination: Coordination normal.  Psychiatric:        Mood and Affect: Mood normal.     LABORATORY DATA:  I have reviewed the data as listed Lab Results  Component Value Date   WBC 2.2 (L) 03/18/2020   HGB 9.8 (L) 03/18/2020   HCT 27.8 (L) 03/18/2020   MCV 91.1 03/18/2020   PLT 64 (L) 03/18/2020   Recent Labs    02/19/20 0801 03/04/20 0835 03/18/20 0826  NA 136 134* 137  K 4.3 4.6 4.6  CL 108 102 108  CO2 23 22 21*  GLUCOSE 163* 114* 129*  BUN 27* 32* 32*  CREATININE 1.92* 1.95* 1.99*  CALCIUM 9.0 8.6* 8.6*  GFRNONAA 38* 37* 36*  GFRAA 44* 43* 42*  PROT 7.7 7.7 7.8  ALBUMIN 4.2 4.1 4.0  AST 35 35 32  ALT 27 25 23   ALKPHOS 120 108 116  BILITOT 0.9 1.0 0.7   Iron/TIBC/Ferritin/ %Sat    Component Value Date/Time   IRON 52 12/18/2019 0826   IRON 105 10/15/2018 1325   TIBC 284 12/18/2019 0826   TIBC 244 (L) 10/15/2018 1325    FERRITIN 146 12/18/2019 0826   FERRITIN 588 (H) 10/15/2018 1325   IRONPCTSAT 18 12/18/2019 0826   IRONPCTSAT 43 10/15/2018 1325      RADIOGRAPHIC STUDIES: I have personally reviewed the radiological images as listed and agreed with the findings in the report. CT Abdomen Pelvis Wo Contrast  Result Date: 02/16/2020 CLINICAL DATA:  Restaging of metastatic left renal cell carcinoma, prior left nephrectomy. EXAM: CT CHEST, ABDOMEN AND PELVIS WITHOUT CONTRAST TECHNIQUE: Multidetector CT imaging of the chest, abdomen and pelvis was performed following the standard protocol without IV contrast. COMPARISON:  Multiple exams, including 10/29/2019 FINDINGS: CT CHEST FINDINGS Cardiovascular: Right Port-A-Cath tip: Right atrium. Coronary, aortic arch, and branch vessel atherosclerotic vascular disease. Mediastinum/Nodes: Further reduction in the size of the right paratracheal lymph node, currently 0.7 cm previously 1.8 cm on 07/14/2019 and previously 0.9 cm on 10/29/2019. Left internal mammary lymph nodes measure up to 0.5 cm in short axis (image 27/2), previously 0.6 cm on 10/29/2019. Lungs/Pleura: No new nodules are identified. Small subpleural nodules Adderall below 4 mm in diameter generally similar to prior. An index left lower lobe nodule measuring 3 mm in diameter on image 76/3 previously measured the same. Musculoskeletal: Lower cervical fusion. There several tiny sclerotic lesions for example in the right anterior fourth rib on image 63/3, these appear stable and there no compelling findings of osseous metastatic disease in the thorax. Thoracic spondylosis. CT ABDOMEN PELVIS FINDINGS Hepatobiliary: Nodular hepatic contour with prominence of the caudate lobe and left hepatic lobe compatible with hepatic cirrhosis, contour not appreciably changed. Borderline gallbladder wall thickening similar to prior. Pancreas: Unremarkable Spleen: Stable splenomegaly. Adrenals/Urinary Tract: Both adrenal glands appear normal.  Left nephrectomy without visible local recurrence along the nephrectomy site. Right renal contour unremarkable. No appreciable urinary tract calculi. Stomach/Bowel: Unremarkable Vascular/Lymphatic: Aortoiliac atherosclerotic vascular disease. Scattered likely reactive lymph nodes along the gastrohepatic ligament, porta hepatis, and retroperitoneum. Aortocaval lymph node 0.9 cm in short axis on image 77/2, stable. Reproductive: Penile implant, reservoir in the left lower pelvis. Other: Trace ascites in the pelvis, perisplenic, perihepatic region, similar to prior. Stable mild mesenteric edema. Musculoskeletal: Unremarkable IMPRESSION: 1. No findings of active malignancy. 2. Further reduction in the size of the right paratracheal lymph node, currently 0.7 cm previously 1.8 cm on 07/14/2019 and previously 0.9 cm on 10/29/2019. 3. Stable small mostly subpleural nodules in the lungs, without new or enlarging nodule. 4. Other imaging findings of potential clinical significance: Coronary atherosclerosis. Hepatic cirrhosis with portal venous hypertension including splenomegaly and trace ascites. Stable mild mesenteric edema. Borderline gallbladder wall thickening, similar to prior. Left nephrectomy without visible local recurrence. 5. Aortic atherosclerosis. Aortic Atherosclerosis (ICD10-I70.0). Electronically Signed   By: Van Clines M.D.   On: 02/16/2020 08:02   CT Chest Wo Contrast  Result Date: 02/16/2020 CLINICAL DATA:  Restaging of metastatic left renal cell carcinoma, prior left nephrectomy. EXAM: CT CHEST, ABDOMEN AND PELVIS WITHOUT CONTRAST TECHNIQUE: Multidetector CT imaging of the chest, abdomen and pelvis was performed following the standard protocol without IV contrast. COMPARISON:  Multiple exams, including 10/29/2019 FINDINGS: CT  CHEST FINDINGS Cardiovascular: Right Port-A-Cath tip: Right atrium. Coronary, aortic arch, and branch vessel atherosclerotic vascular disease. Mediastinum/Nodes: Further  reduction in the size of the right paratracheal lymph node, currently 0.7 cm previously 1.8 cm on 07/14/2019 and previously 0.9 cm on 10/29/2019. Left internal mammary lymph nodes measure up to 0.5 cm in short axis (image 27/2), previously 0.6 cm on 10/29/2019. Lungs/Pleura: No new nodules are identified. Small subpleural nodules Adderall below 4 mm in diameter generally similar to prior. An index left lower lobe nodule measuring 3 mm in diameter on image 76/3 previously measured the same. Musculoskeletal: Lower cervical fusion. There several tiny sclerotic lesions for example in the right anterior fourth rib on image 63/3, these appear stable and there no compelling findings of osseous metastatic disease in the thorax. Thoracic spondylosis. CT ABDOMEN PELVIS FINDINGS Hepatobiliary: Nodular hepatic contour with prominence of the caudate lobe and left hepatic lobe compatible with hepatic cirrhosis, contour not appreciably changed. Borderline gallbladder wall thickening similar to prior. Pancreas: Unremarkable Spleen: Stable splenomegaly. Adrenals/Urinary Tract: Both adrenal glands appear normal. Left nephrectomy without visible local recurrence along the nephrectomy site. Right renal contour unremarkable. No appreciable urinary tract calculi. Stomach/Bowel: Unremarkable Vascular/Lymphatic: Aortoiliac atherosclerotic vascular disease. Scattered likely reactive lymph nodes along the gastrohepatic ligament, porta hepatis, and retroperitoneum. Aortocaval lymph node 0.9 cm in short axis on image 77/2, stable. Reproductive: Penile implant, reservoir in the left lower pelvis. Other: Trace ascites in the pelvis, perisplenic, perihepatic region, similar to prior. Stable mild mesenteric edema. Musculoskeletal: Unremarkable IMPRESSION: 1. No findings of active malignancy. 2. Further reduction in the size of the right paratracheal lymph node, currently 0.7 cm previously 1.8 cm on 07/14/2019 and previously 0.9 cm on 10/29/2019.  3. Stable small mostly subpleural nodules in the lungs, without new or enlarging nodule. 4. Other imaging findings of potential clinical significance: Coronary atherosclerosis. Hepatic cirrhosis with portal venous hypertension including splenomegaly and trace ascites. Stable mild mesenteric edema. Borderline gallbladder wall thickening, similar to prior. Left nephrectomy without visible local recurrence. 5. Aortic atherosclerosis. Aortic Atherosclerosis (ICD10-I70.0). Electronically Signed   By: Van Clines M.D.   On: 02/16/2020 08:02     ASSESSMENT & PLAN:  1. Renal cell carcinoma of left kidney (HCC)   2. Encounter for antineoplastic immunotherapy   3. Thrombocytopenia ([place])   4. 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 #Chronic thrombocytopenia and neutropenia, secondary to cirrhosis.  Stable counts.  Continue vitamin B12 supplementation.  #Chronic kidney disease, avoid nephrotoxins.  Kidney function has improved today.  Continue monitor.  #Chronic liver cirrhosis with thrombocytopenia and neutropenia.   #Anemia secondary to chronic kidney disease, hemoglobin stable at 9.8.  Follow-up 2 week lab MD for assessment evaluation prior to nivolumab. All questions were answered. The patient knows to call the clinic with any problems questions or concerns.   Earlie Server, MD, PhD Hematology Oncology [facility] Pager- [phone] 04/01/2020

## 2020-04-15 ENCOUNTER — Other Ambulatory Visit: Payer: Self-pay

## 2020-04-15 ENCOUNTER — Inpatient Hospital Stay: Payer: BC Managed Care – PPO

## 2020-04-15 ENCOUNTER — Inpatient Hospital Stay (HOSPITAL_BASED_OUTPATIENT_CLINIC_OR_DEPARTMENT_OTHER): Payer: BC Managed Care – PPO | Admitting: Oncology

## 2020-04-15 ENCOUNTER — Encounter: Payer: Self-pay | Admitting: Oncology

## 2020-04-15 VITALS — BP 128/76 | HR 69 | Resp 16

## 2020-04-15 VITALS — BP 93/61 | HR 71 | Temp 97.3°F | Resp 16 | Wt 258.8 lb

## 2020-04-15 DIAGNOSIS — C642 Malignant neoplasm of left kidney, except renal pelvis: Secondary | ICD-10-CM | POA: Diagnosis not present

## 2020-04-15 DIAGNOSIS — Z5112 Encounter for antineoplastic immunotherapy: Secondary | ICD-10-CM

## 2020-04-15 DIAGNOSIS — D649 Anemia, unspecified: Secondary | ICD-10-CM

## 2020-04-15 DIAGNOSIS — I959 Hypotension, unspecified: Secondary | ICD-10-CM

## 2020-04-15 DIAGNOSIS — D696 Thrombocytopenia, unspecified: Secondary | ICD-10-CM | POA: Diagnosis not present

## 2020-04-15 DIAGNOSIS — C649 Malignant neoplasm of unspecified kidney, except renal pelvis: Secondary | ICD-10-CM

## 2020-04-15 DIAGNOSIS — D708 Other neutropenia: Secondary | ICD-10-CM

## 2020-04-15 LAB — CBC WITH DIFFERENTIAL/PLATELET
Abs Immature Granulocytes: 0.01 10*3/uL (ref 0.00–0.07)
Basophils Absolute: 0 10*3/uL (ref 0.0–0.1)
Basophils Relative: 1 %
Eosinophils Absolute: 0.1 10*3/uL (ref 0.0–0.5)
Eosinophils Relative: 6 %
HCT: 27.4 % — ABNORMAL LOW (ref 39.0–52.0)
Hemoglobin: 9.7 g/dL — ABNORMAL LOW (ref 13.0–17.0)
Immature Granulocytes: 0 %
Lymphocytes Relative: 17 %
Lymphs Abs: 0.4 10*3/uL — ABNORMAL LOW (ref 0.7–4.0)
MCH: 32 pg (ref 26.0–34.0)
MCHC: 35.4 g/dL (ref 30.0–36.0)
MCV: 90.4 fL (ref 80.0–100.0)
Monocytes Absolute: 0.3 10*3/uL (ref 0.1–1.0)
Monocytes Relative: 13 %
Neutro Abs: 1.4 10*3/uL — ABNORMAL LOW (ref 1.7–7.7)
Neutrophils Relative %: 63 %
Platelets: 67 10*3/uL — ABNORMAL LOW (ref 150–400)
RBC: 3.03 MIL/uL — ABNORMAL LOW (ref 4.22–5.81)
RDW: 13.2 % (ref 11.5–15.5)
WBC: 2.3 10*3/uL — ABNORMAL LOW (ref 4.0–10.5)
nRBC: 0 % (ref 0.0–0.2)

## 2020-04-15 LAB — COMPREHENSIVE METABOLIC PANEL
ALT: 27 U/L (ref 0–44)
AST: 34 U/L (ref 15–41)
Albumin: 4 g/dL (ref 3.5–5.0)
Alkaline Phosphatase: 117 U/L (ref 38–126)
Anion gap: 10 (ref 5–15)
BUN: 22 mg/dL — ABNORMAL HIGH (ref 6–20)
CO2: 21 mmol/L — ABNORMAL LOW (ref 22–32)
Calcium: 8.6 mg/dL — ABNORMAL LOW (ref 8.9–10.3)
Chloride: 104 mmol/L (ref 98–111)
Creatinine, Ser: 1.87 mg/dL — ABNORMAL HIGH (ref 0.61–1.24)
GFR calc Af Amer: 45 mL/min — ABNORMAL LOW (ref >60)
GFR calc non Af Amer: 39 mL/min — ABNORMAL LOW (ref >60)
Glucose, Bld: 162 mg/dL — ABNORMAL HIGH (ref 70–99)
Potassium: 4.3 mmol/L (ref 3.5–5.1)
Sodium: 135 mmol/L (ref 135–145)
Total Bilirubin: 0.8 mg/dL (ref 0.3–1.2)
Total Protein: 7.8 g/dL (ref 6.5–8.1)

## 2020-04-15 LAB — CORTISOL: Cortisol, Plasma: 28.3 ug/dL

## 2020-04-15 MED ORDER — HEPARIN SOD (PORK) LOCK FLUSH 100 UNIT/ML IV SOLN
500.0000 [IU] | Freq: Once | INTRAVENOUS | Status: AC | PRN
  Administered 2020-04-15: 500 [IU]
  Filled 2020-04-15: qty 5

## 2020-04-15 MED ORDER — SODIUM CHLORIDE 0.9 % IV SOLN
Freq: Once | INTRAVENOUS | Status: AC
  Filled 2020-04-15: qty 250

## 2020-04-15 MED ORDER — SODIUM CHLORIDE 0.9 % IV SOLN
Freq: Once | INTRAVENOUS | Status: DC
  Filled 2020-04-15: qty 250

## 2020-04-15 MED ORDER — SODIUM CHLORIDE 0.9 % IV SOLN
240.0000 mg | Freq: Once | INTRAVENOUS | Status: AC
  Administered 2020-04-15: 240 mg via INTRAVENOUS
  Filled 2020-04-15: qty 24

## 2020-04-15 MED ORDER — HEPARIN SOD (PORK) LOCK FLUSH 100 UNIT/ML IV SOLN
INTRAVENOUS | Status: AC
  Filled 2020-04-15: qty 5

## 2020-04-15 NOTE — Progress Notes (Signed)
Patient's bp is low today and he states he knows it is low because he feels "swimmy headed".

## 2020-04-15 NOTE — Progress Notes (Signed)
Hematology/Oncology follow-up [facility] Telephone:([phone] [phone]   Patient Care Team: Albina Billet, MD as PCP - General (Internal Medicine) Earlie Server, MD as Consulting Physician (Hematology and Oncology)  REFERRING PROVIDER: Albina Billet, MD  CHIEF COMPLAINTS/REASON FOR VISIT:  Follow-up for kidney cancer treatments  HISTORY OF PRESENTING ILLNESS:   Henry Moore is a  59 y.o.  male with PMH listed below was seen in consultation at the request of  Albina Billet, MD  for evaluation of kidney cancer Extensive medical records were reviewed Patient had MRI lumbar spine without contrast done on 07/13/2018 which showed mired lumbar degenerative disease. CT thoracic spine was done on 08/05/2018 which showed 5 cm left renal mass. 09/04/2018 CT abdomen and pelvis with and without contrast showed 5 cm round enhancing solid mass in the left kidney.  No involvement of left renal vein.  No lymphadenopathy Morphologic changes consistent with cirrhosis and the portal hypertension.  Small volume ascites and splenomegaly. . 10/16/2018 Left nephrectomy showed renal cell carcinoma, clear-cell type, nuclear grade 4, tumor extends into the renal vein and renal sinus fat.  Urethral, vascular and or resection margins are negative for tumor pT3a Nx Mx  01/28/2019 patient had another CT chest abdomen pelvis done with contrast Showed interval left nephrectomy.  Scattered tiny pulmonary nodules bilaterally.  Nonspecific.  No other evidence of metastatic disease.  Cirrhosis changes extensive coronary and aortic atherosclerosis  07/14/2019 patient underwent surveillance CT chest abdomen pelvis without contrast Interval progression of pulmonary metastasis with new enlarged right paratracheal lymph node 1.7 cm, previously 0.6 cm one-point since worrisome for metastatic adenopathy.  Multiple progressive pulmonary nodules, index nodule within the lingula measures 1.3 cm, previously  3 mm. Index subpleural nodule within the anterior right upper lobe measuring 1.8 cm, previously 3 cm Index nodule within the post lateral right lower lobe measuring 5 mm, new from previous care Aortic sclerosis.  Three-vessel coronary artery calcification noted.  Cirrhosis changes.  Splenomegaly.  Patient was referred to establish care with medical oncology for further discussion and evaluation of metastatic renal cell carcinoma. Patient reports feeling well at baseline today.  Denies any shortness of breath, cough, hemoptysis, back pain, headache. He quitted smoking 2015.  Not currently actively drinking alcohol as well. Patient has chronic back pain unchanged.  # Liver cirrhosis with small amount of ascites/splenomegaly patient sees gastroenterology Dr. [name].  . He will get ultrasound surveillance due in February 2021 for screening of [place].  EGD surveillance for Barrett's plan in April 2021.  # Diabetes, on Humalog sliding scale and metformin.  #Family history of cancer and personal history of RCC.  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  Status post ipilimumab and nivolumab treatment.  # 10/29/2019 CT chest abdomen pelvis without contrast images were independently reviewed by me and discussed with patient.   CT showed marked improvement with resolution of many of the pulmonary nodules, prominent reduced size of other nodules.  Considerably reduced size of the right lower paratracheal lymph node now 0.9 cm in diameter. Hepatic cirrhosis with prominent splenomegaly and trace perisplenic ascites. Chronic CT findings includes aortic atherosclerotic vascular disease.  Mild sigmoid colon diverticulosis  # CKD, he establish care with Dr. Candiss Norse for chronic  kidney disease.  Blood pressure medication has been adjusted.  Hydrochlorothiazide decreased to 12.5 mg daily.  INTERVAL HISTORY Henry Moore is a 59 y.o. male who has above history reviewed by me today presents for follow up visit for management of stage IV RCC  Problems and complaints are listed below: Patient reports " swimmy head".  Blood pressure was 93/61.  Denies any chest pain, shortness of breath, fever, chills, diarrhea.  Coming coming please  Patient no new complaints.  Some fatigue after preparing his granddaughter's birthday party this past weekend.  No skin rash, sob, cough, diarrhea.   Review of Systems  Constitutional: Positive for fatigue. Negative for appetite change, chills, fever and unexpected weight change.  HENT:   Negative for hearing loss and voice change.   Eyes: Negative for eye problems and icterus.  Respiratory: Negative for chest tightness, cough and shortness of breath.   Cardiovascular: Negative for chest pain and leg swelling.  Gastrointestinal: Negative for abdominal distention and abdominal pain.  Endocrine: Negative for hot flashes.  Genitourinary: Negative for difficulty urinating, dysuria and frequency.   Musculoskeletal: Negative for arthralgias.       Chronic back pain  Skin: Negative for itching and rash.  Neurological: Negative for light-headedness and numbness.  Hematological: Negative for adenopathy. Does not bruise/bleed easily.  Psychiatric/Behavioral: Negative for confusion.    MEDICAL HISTORY:  Past Medical History:  Diagnosis Date  . Cough    SINUS DRAINAGE CAUSING COUGHING , REPORTS CLEAR MUCOUS   . Diabetes mellitus without complication ([place])   . Family history of breast cancer   . Family history of colon cancer   . Family history of stomach cancer   . HTN (hypertension)   . Hyperlipemia   . Left renal mass   . Platelets decreased ([place])    DENIES UNSUAL BLEEDING   . PONV (postoperative nausea and vomiting)   . Renal cell  carcinoma ([place]) 08/04/2019  . WBC decreased     SURGICAL HISTORY: Past Surgical History:  Procedure Laterality Date  . ANKLE SURGERY Left   . ANTERIOR CERVICAL DECOMP/DISCECTOMY FUSION N/A 04/16/2014   Procedure: ANTERIOR CERVICAL DECOMPRESSION/DISCECTOMY FUSION  (ACDF C5-C7)   (2 LEVELS)     ;  Surgeon: Melina Schools, MD;  Location: [facility];  Service: Orthopedics;  Laterality: N/A;  . APPENDECTOMY    . BACK SURGERY    . ESOPHAGOGASTRODUODENOSCOPY (EGD) WITH PROPOFOL N/A 02/06/2019   Procedure: ESOPHAGOGASTRODUODENOSCOPY (EGD) WITH PROPOFOL;  Surgeon: [name], MD;  Location: [facility] ENDOSCOPY;  Service: Gastroenterology;  Laterality: N/A;  . ESOPHAGOGASTRODUODENOSCOPY (EGD) WITH PROPOFOL N/A 03/04/2019   Procedure: ESOPHAGOGASTRODUODENOSCOPY (EGD) WITH PROPOFOL;  Surgeon: Lin Landsman, MD;  Location: [facility] ENDOSCOPY;  Service: Gastroenterology;  Laterality: N/A;  . ESOPHAGOGASTRODUODENOSCOPY (EGD) WITH PROPOFOL N/A 04/22/2019   Procedure: ESOPHAGOGASTRODUODENOSCOPY (EGD) WITH PROPOFOL;  Surgeon: [name], MD;  Location: [facility] ENDOSCOPY;  Service: Gastroenterology;  Laterality: N/A;  . FRACTURE SURGERY    . HYDROCELE EXCISION    . KNEE ARTHROSCOPY Bilateral   . LAPAROSCOPIC NEPHRECTOMY, HAND ASSISTED Left 10/16/2018   Procedure: LEFT HAND ASSISTED LAPAROSCOPIC NEPHRECTOMY;  Surgeon: Lucas Mallow, MD;  Location: [facility];  Service: Urology;  Laterality: Left;  . NASAL SINUS SURGERY     X 2  . 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 PORTA CATH INSERTION N/A 11/11/2019   Procedure: PORTA CATH INSERTION;  Surgeon: Katha Cabal, MD;  Location: [place] [facility];  Service: Cardiovascular;  Laterality: N/A;  .  SHOULDER SURGERY Left     SOCIAL HISTORY: Social History   Socioeconomic History  . Marital status: Married    Spouse name: Not on file  . Number of children: Not on file  . Years of education: Not on file  . Highest education level: Not on file  Occupational  History  . Not on file  Tobacco Use  . Smoking status: Former Smoker    Packs/day: 0.25    Years: 20.00    Pack years: 5.00    Quit date: 2015    Years since quitting: 6.7  . Smokeless tobacco: Never Used  Vaping Use  . Vaping Use: Never used  Substance and Sexual Activity  . Alcohol use: Not Currently    Alcohol/week: 0.0 standard drinks    Comment: no alchol in 1 year  . Drug use: No  . Sexual activity: Not on file  Other Topics Concern  . Not on file  Social History Narrative  . Not on file   Social Determinants of Health   Financial Resource Strain:   . Difficulty of Paying Living Expenses: Not on file  Food Insecurity:   . Worried About Charity fundraiser in the Last Year: Not on file  . Ran Out of Food in the Last Year: Not on file  Transportation Needs:   . Lack of Transportation (Medical): Not on file  . Lack of Transportation (Non-Medical): Not on file  Physical Activity:   . Days of Exercise per Week: Not on file  . Minutes of Exercise per Session: Not on file  Stress:   . Feeling of Stress : Not on file  Social Connections:   . Frequency of Communication with Friends and Family: Not on file  . Frequency of Social Gatherings with Friends and Family: Not on file  . Attends Religious Services: Not on file  . Active Member of Clubs or Organizations: Not on file  . Attends Archivist Meetings: Not on file  . Marital Status: Not on file  Intimate Partner Violence:   . Fear of Current or Ex-Partner: Not on file  . Emotionally Abused: Not on file  . Physically Abused: Not on file  . Sexually Abused: Not on file    FAMILY HISTORY: Family History  Problem Relation Age of Onset  . Diabetes Mother   . Cancer Mother        neuroendocrine cancer  . Cancer Father        neuroendocrine cancer of meninges  . Cancer Maternal Grandmother        tomach dx 33  . Alzheimer's disease Paternal Grandmother   . Lung cancer Paternal Grandfather   . Lung  cancer Maternal Aunt   . Colon cancer Maternal Uncle   . Breast cancer Paternal Aunt        both dx 53s  . Ovarian cancer Other        dx 73  . Breast cancer Cousin     ALLERGIES:  is allergic to codeine and mucinex [guaifenesin er].  MEDICATIONS:  Current Outpatient Medications  Medication Sig Dispense Refill  . ACCU-CHEK AVIVA PLUS test strip CHECK BL00D SUGAR ONCE OR TWICE DAILY. 100 each PRN  . amLODipine (NORVASC) 10 MG tablet Take 10 mg by mouth daily.    . carvedilol (COREG) 25 MG tablet Take 1 tablet (25 mg total) by mouth 2 (two) times daily with a meal. 180 tablet 3  . Cholecalciferol (VITAMIN D3) 125 MCG (5000 UT)  CAPS Take 5,000 Units by mouth daily.    Marland Kitchen glipiZIDE (GLUCOTROL) 5 MG tablet Take 1 tablet (5 mg total) by mouth daily before breakfast. 90 tablet 3  . hydrochlorothiazide (HYDRODIURIL) 25 MG tablet TAKE 1 TABLET DAILY. (Patient taking differently: Take 12.5 mg by mouth daily. ) 90 tablet 0  . losartan (COZAAR) 100 MG tablet Take 1 tablet by mouth daily. (Patient taking differently: Take 100 mg by mouth daily. ) 90 tablet 0  . lovastatin (MEVACOR) 40 MG tablet Take 1 tablet (40 mg total) by mouth at bedtime. 60 tablet 0  . metFORMIN (GLUCOPHAGE-XR) 500 MG 24 hr tablet Take 500 mg by mouth 2 (two) times daily.     . vitamin B-12 (CYANOCOBALAMIN) 1000 MCG tablet Take 1 tablet (1,000 mcg total) by mouth daily. 90 tablet 0  . insulin lispro (INSULIN LISPRO) 100 UNIT/ML KwikPen Junior Inject into the skin 3 (three) times daily as needed (Above 250). Sliding scale (Patient not taking: Reported on 02/05/2020)    . prochlorperazine (COMPAZINE) 10 MG tablet Take 1 tablet (10 mg total) by mouth every 6 (six) hours as needed for nausea or vomiting. (Patient not taking: Reported on 04/15/2020) 30 tablet 0   No current facility-administered medications for this visit.   Facility-Administered Medications Ordered in Other Visits  Medication Dose Route Frequency Provider Last Rate  Last Admin  . sodium chloride flush (NS) 0.9 % injection 10 mL  10 mL Intravenous PRN Earlie Server, MD   10 mL at 01/22/20 0831     PHYSICAL EXAMINATION: ECOG PERFORMANCE STATUS: 0 - Asymptomatic Vitals:   04/15/20 0914 04/15/20 0915  BP: 96/60 93/61  Pulse: 69 71  Resp:    Temp:     Filed Weights   04/15/20 0905  Weight: 258 lb 12.8 oz (117.4 kg)    Physical Exam Constitutional:      General: He is not in acute distress.    Appearance: He is obese.  HENT:     Head: Normocephalic and atraumatic.  Eyes:     General: No scleral icterus. Cardiovascular:     Rate and Rhythm: Normal rate and regular rhythm.     Heart sounds: Normal heart sounds.  Pulmonary:     Effort: Pulmonary effort is normal. No respiratory distress.     Breath sounds: No wheezing.  Abdominal:     General: Bowel sounds are normal. There is no distension.     Palpations: Abdomen is soft.  Musculoskeletal:        General: No deformity. Normal range of motion.     Cervical back: Normal range of motion and neck supple.  Skin:    General: Skin is warm and dry.     Findings: No erythema or rash.  Neurological:     Mental Status: He is alert and oriented to person, place, and time. Mental status is at baseline.     Cranial Nerves: No cranial nerve deficit.     Coordination: Coordination normal.  Psychiatric:        Mood and Affect: Mood normal.     LABORATORY DATA:  I have reviewed the data as listed Lab Results  Component Value Date   WBC 2.3 (L) 04/15/2020   HGB 9.7 (L) 04/15/2020   HCT 27.4 (L) 04/15/2020   MCV 90.4 04/15/2020   PLT 67 (L) 04/15/2020   Recent Labs    03/18/20 0826 04/01/20 0837 04/15/20 0853  NA 137 139 135  K 4.6 4.3 4.3  CL 108 107 104  CO2 21* 21* 21*  GLUCOSE 129* 123* 162*  BUN 32* 24* 22*  CREATININE 1.99* 1.69* 1.87*  CALCIUM 8.6* 8.5* 8.6*  GFRNONAA 36* 44* 39*  GFRAA 42* 51* 45*  PROT 7.8 7.9 7.8  ALBUMIN 4.0 4.0 4.0  AST 32 31 34  ALT 23 24 27   ALKPHOS  116 102 117  BILITOT 0.7 0.9 0.8   Iron/TIBC/Ferritin/ %Sat    Component Value Date/Time   IRON 52 12/18/2019 0826   IRON 105 10/15/2018 1325   TIBC 284 12/18/2019 0826   TIBC 244 (L) 10/15/2018 1325   FERRITIN 146 12/18/2019 0826   FERRITIN 588 (H) 10/15/2018 1325   IRONPCTSAT 18 12/18/2019 0826   IRONPCTSAT 43 10/15/2018 1325      RADIOGRAPHIC STUDIES: I have personally reviewed the radiological images as listed and agreed with the findings in the report. CT Abdomen Pelvis Wo Contrast  Result Date: 02/16/2020 CLINICAL DATA:  Restaging of metastatic left renal cell carcinoma, prior left nephrectomy. EXAM: CT CHEST, ABDOMEN AND PELVIS WITHOUT CONTRAST TECHNIQUE: Multidetector CT imaging of the chest, abdomen and pelvis was performed following the standard protocol without IV contrast. COMPARISON:  Multiple exams, including 10/29/2019 FINDINGS: CT CHEST FINDINGS Cardiovascular: Right Port-A-Cath tip: Right atrium. Coronary, aortic arch, and branch vessel atherosclerotic vascular disease. Mediastinum/Nodes: Further reduction in the size of the right paratracheal lymph node, currently 0.7 cm previously 1.8 cm on 07/14/2019 and previously 0.9 cm on 10/29/2019. Left internal mammary lymph nodes measure up to 0.5 cm in short axis (image 27/2), previously 0.6 cm on 10/29/2019. Lungs/Pleura: No new nodules are identified. Small subpleural nodules Adderall below 4 mm in diameter generally similar to prior. An index left lower lobe nodule measuring 3 mm in diameter on image 76/3 previously measured the same. Musculoskeletal: Lower cervical fusion. There several tiny sclerotic lesions for example in the right anterior fourth rib on image 63/3, these appear stable and there no compelling findings of osseous metastatic disease in the thorax. Thoracic spondylosis. CT ABDOMEN PELVIS FINDINGS Hepatobiliary: Nodular hepatic contour with prominence of the caudate lobe and left hepatic lobe compatible with hepatic  cirrhosis, contour not appreciably changed. Borderline gallbladder wall thickening similar to prior. Pancreas: Unremarkable Spleen: Stable splenomegaly. Adrenals/Urinary Tract: Both adrenal glands appear normal. Left nephrectomy without visible local recurrence along the nephrectomy site. Right renal contour unremarkable. No appreciable urinary tract calculi. Stomach/Bowel: Unremarkable Vascular/Lymphatic: Aortoiliac atherosclerotic vascular disease. Scattered likely reactive lymph nodes along the gastrohepatic ligament, porta hepatis, and retroperitoneum. Aortocaval lymph node 0.9 cm in short axis on image 77/2, stable. Reproductive: Penile implant, reservoir in the left lower pelvis. Other: Trace ascites in the pelvis, perisplenic, perihepatic region, similar to prior. Stable mild mesenteric edema. Musculoskeletal: Unremarkable IMPRESSION: 1. No findings of active malignancy. 2. Further reduction in the size of the right paratracheal lymph node, currently 0.7 cm previously 1.8 cm on 07/14/2019 and previously 0.9 cm on 10/29/2019. 3. Stable small mostly subpleural nodules in the lungs, without new or enlarging nodule. 4. Other imaging findings of potential clinical significance: Coronary atherosclerosis. Hepatic cirrhosis with portal venous hypertension including splenomegaly and trace ascites. Stable mild mesenteric edema. Borderline gallbladder wall thickening, similar to prior. Left nephrectomy without visible local recurrence. 5. Aortic atherosclerosis. Aortic Atherosclerosis (ICD10-I70.0). Electronically Signed   By: Van Clines M.D.   On: 02/16/2020 08:02   CT Chest Wo Contrast  Result Date: 02/16/2020 CLINICAL DATA:  Restaging of metastatic left renal cell carcinoma, prior left nephrectomy.  EXAM: CT CHEST, ABDOMEN AND PELVIS WITHOUT CONTRAST TECHNIQUE: Multidetector CT imaging of the chest, abdomen and pelvis was performed following the standard protocol without IV contrast. COMPARISON:  Multiple  exams, including 10/29/2019 FINDINGS: CT CHEST FINDINGS Cardiovascular: Right Port-A-Cath tip: Right atrium. Coronary, aortic arch, and branch vessel atherosclerotic vascular disease. Mediastinum/Nodes: Further reduction in the size of the right paratracheal lymph node, currently 0.7 cm previously 1.8 cm on 07/14/2019 and previously 0.9 cm on 10/29/2019. Left internal mammary lymph nodes measure up to 0.5 cm in short axis (image 27/2), previously 0.6 cm on 10/29/2019. Lungs/Pleura: No new nodules are identified. Small subpleural nodules Adderall below 4 mm in diameter generally similar to prior. An index left lower lobe nodule measuring 3 mm in diameter on image 76/3 previously measured the same. Musculoskeletal: Lower cervical fusion. There several tiny sclerotic lesions for example in the right anterior fourth rib on image 63/3, these appear stable and there no compelling findings of osseous metastatic disease in the thorax. Thoracic spondylosis. CT ABDOMEN PELVIS FINDINGS Hepatobiliary: Nodular hepatic contour with prominence of the caudate lobe and left hepatic lobe compatible with hepatic cirrhosis, contour not appreciably changed. Borderline gallbladder wall thickening similar to prior. Pancreas: Unremarkable Spleen: Stable splenomegaly. Adrenals/Urinary Tract: Both adrenal glands appear normal. Left nephrectomy without visible local recurrence along the nephrectomy site. Right renal contour unremarkable. No appreciable urinary tract calculi. Stomach/Bowel: Unremarkable Vascular/Lymphatic: Aortoiliac atherosclerotic vascular disease. Scattered likely reactive lymph nodes along the gastrohepatic ligament, porta hepatis, and retroperitoneum. Aortocaval lymph node 0.9 cm in short axis on image 77/2, stable. Reproductive: Penile implant, reservoir in the left lower pelvis. Other: Trace ascites in the pelvis, perisplenic, perihepatic region, similar to prior. Stable mild mesenteric edema. Musculoskeletal:  Unremarkable IMPRESSION: 1. No findings of active malignancy. 2. Further reduction in the size of the right paratracheal lymph node, currently 0.7 cm previously 1.8 cm on 07/14/2019 and previously 0.9 cm on 10/29/2019. 3. Stable small mostly subpleural nodules in the lungs, without new or enlarging nodule. 4. Other imaging findings of potential clinical significance: Coronary atherosclerosis. Hepatic cirrhosis with portal venous hypertension including splenomegaly and trace ascites. Stable mild mesenteric edema. Borderline gallbladder wall thickening, similar to prior. Left nephrectomy without visible local recurrence. 5. Aortic atherosclerosis. Aortic Atherosclerosis (ICD10-I70.0). Electronically Signed   By: Van Clines M.D.   On: 02/16/2020 08:02     ASSESSMENT & PLAN:  1. Renal cell carcinoma of left kidney (HCC)   2. Encounter for antineoplastic immunotherapy   3. Thrombocytopenia ([place])   4. Normocytic anemia   5. Other neutropenia ([place])   6. 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 Labs are reviewed and discussed with patient. Proceed with treatment.   # Hypotension.  He was feeling well this morning and took all 4 of his BP medication.  1L of NS IV x 1 today  Advise him to monitor BP when he gets home. If his symptoms are persistent, recommend him to seek medical advise.  Recommend him to monitor blood pressure at home.  If systolic BP is less than 258, ask him to hold his blood pressure medication.  If SBP between 110- 130, hold amlodipine/HCTZ.  Take carvedilol and losartan.  I asked patient to keep a log of his blood pressure and further discuss with primary care provider.   #Chronic thrombocytopenia and neutropenia, secondary to cirrhosis.  Stable counts.  Continue vitamin B12 supplementation.  #Chronic kidney disease,  avoid nephrotoxins.  Kidney function has improved today.  Continue monitor.  #Chronic liver cirrhosis with thrombocytopenia and neutropenia.   #Anemia secondary to chronic kidney disease, stable blood count.  Monitor.  Follow-up 2 week lab MD for assessment evaluation prior to nivolumab. All questions were answered. The patient knows to call the clinic with any problems questions or concerns.   Earlie Server, MD, PhD Hematology Oncology [facility] at [facility] Pager- [phone] 04/15/2020

## 2020-04-29 ENCOUNTER — Inpatient Hospital Stay: Payer: BC Managed Care – PPO

## 2020-04-29 ENCOUNTER — Encounter: Payer: Self-pay | Admitting: Oncology

## 2020-04-29 ENCOUNTER — Other Ambulatory Visit: Payer: Self-pay

## 2020-04-29 ENCOUNTER — Inpatient Hospital Stay (HOSPITAL_BASED_OUTPATIENT_CLINIC_OR_DEPARTMENT_OTHER): Payer: BC Managed Care – PPO | Admitting: Oncology

## 2020-04-29 VITALS — BP 188/83 | HR 105 | Temp 98.4°F | Resp 16 | Wt 253.0 lb

## 2020-04-29 DIAGNOSIS — C642 Malignant neoplasm of left kidney, except renal pelvis: Secondary | ICD-10-CM

## 2020-04-29 DIAGNOSIS — D649 Anemia, unspecified: Secondary | ICD-10-CM

## 2020-04-29 DIAGNOSIS — I10 Essential (primary) hypertension: Secondary | ICD-10-CM

## 2020-04-29 DIAGNOSIS — Z5112 Encounter for antineoplastic immunotherapy: Secondary | ICD-10-CM

## 2020-04-29 DIAGNOSIS — D696 Thrombocytopenia, unspecified: Secondary | ICD-10-CM | POA: Diagnosis not present

## 2020-04-29 DIAGNOSIS — C649 Malignant neoplasm of unspecified kidney, except renal pelvis: Secondary | ICD-10-CM

## 2020-04-29 LAB — COMPREHENSIVE METABOLIC PANEL
ALT: 21 U/L (ref 0–44)
AST: 29 U/L (ref 15–41)
Albumin: 4.3 g/dL (ref 3.5–5.0)
Alkaline Phosphatase: 108 U/L (ref 38–126)
Anion gap: 7 (ref 5–15)
BUN: 20 mg/dL (ref 6–20)
CO2: 23 mmol/L (ref 22–32)
Calcium: 8.7 mg/dL — ABNORMAL LOW (ref 8.9–10.3)
Chloride: 108 mmol/L (ref 98–111)
Creatinine, Ser: 1.58 mg/dL — ABNORMAL HIGH (ref 0.61–1.24)
GFR calc Af Amer: 55 mL/min — ABNORMAL LOW (ref >60)
GFR calc non Af Amer: 48 mL/min — ABNORMAL LOW (ref >60)
Glucose, Bld: 109 mg/dL — ABNORMAL HIGH (ref 70–99)
Potassium: 4.2 mmol/L (ref 3.5–5.1)
Sodium: 138 mmol/L (ref 135–145)
Total Bilirubin: 1.5 mg/dL — ABNORMAL HIGH (ref 0.3–1.2)
Total Protein: 8 g/dL (ref 6.5–8.1)

## 2020-04-29 LAB — CBC WITH DIFFERENTIAL/PLATELET
Abs Immature Granulocytes: 0.02 10*3/uL (ref 0.00–0.07)
Basophils Absolute: 0 10*3/uL (ref 0.0–0.1)
Basophils Relative: 1 %
Eosinophils Absolute: 0.1 10*3/uL (ref 0.0–0.5)
Eosinophils Relative: 5 %
HCT: 28.9 % — ABNORMAL LOW (ref 39.0–52.0)
Hemoglobin: 10.2 g/dL — ABNORMAL LOW (ref 13.0–17.0)
Immature Granulocytes: 1 %
Lymphocytes Relative: 15 %
Lymphs Abs: 0.3 10*3/uL — ABNORMAL LOW (ref 0.7–4.0)
MCH: 31.1 pg (ref 26.0–34.0)
MCHC: 35.3 g/dL (ref 30.0–36.0)
MCV: 88.1 fL (ref 80.0–100.0)
Monocytes Absolute: 0.3 10*3/uL (ref 0.1–1.0)
Monocytes Relative: 15 %
Neutro Abs: 1.4 10*3/uL — ABNORMAL LOW (ref 1.7–7.7)
Neutrophils Relative %: 63 %
Platelets: 87 10*3/uL — ABNORMAL LOW (ref 150–400)
RBC: 3.28 MIL/uL — ABNORMAL LOW (ref 4.22–5.81)
RDW: 13.2 % (ref 11.5–15.5)
WBC: 2.2 10*3/uL — ABNORMAL LOW (ref 4.0–10.5)
nRBC: 0 % (ref 0.0–0.2)

## 2020-04-29 LAB — TSH: TSH: 3.979 u[IU]/mL (ref 0.350–4.500)

## 2020-04-29 MED ORDER — SODIUM CHLORIDE 0.9% FLUSH
10.0000 mL | INTRAVENOUS | Status: DC | PRN
  Administered 2020-04-29: 10 mL via INTRAVENOUS
  Filled 2020-04-29: qty 10

## 2020-04-29 MED ORDER — HEPARIN SOD (PORK) LOCK FLUSH 100 UNIT/ML IV SOLN
500.0000 [IU] | Freq: Once | INTRAVENOUS | Status: AC
  Administered 2020-04-29: 500 [IU] via INTRAVENOUS
  Filled 2020-04-29: qty 5

## 2020-04-29 NOTE — Progress Notes (Signed)
Patient reports allergy drainage that he has this time of the year.  He stopped all bp meds except the Losartan 100mg  QD and bp is 188/83 today.

## 2020-04-29 NOTE — Progress Notes (Signed)
Hematology/Oncology follow-up [facility] Telephone:([phone] [phone]   Patient Care Team: Albina Billet, MD as PCP - General (Internal Medicine) Earlie Server, MD as Consulting Physician (Hematology and Oncology)  REFERRING PROVIDER: Albina Billet, MD  CHIEF COMPLAINTS/REASON FOR VISIT:  Follow-up for kidney cancer treatments  HISTORY OF PRESENTING ILLNESS:   Henry Moore is a  59 y.o.  male with PMH listed below was seen in consultation at the request of  Albina Billet, MD  for evaluation of kidney cancer Extensive medical records were reviewed Patient had MRI lumbar spine without contrast done on 07/13/2018 which showed mired lumbar degenerative disease. CT thoracic spine was done on 08/05/2018 which showed 5 cm left renal mass. 09/04/2018 CT abdomen and pelvis with and without contrast showed 5 cm round enhancing solid mass in the left kidney.  No involvement of left renal vein.  No lymphadenopathy Morphologic changes consistent with cirrhosis and the portal hypertension.  Small volume ascites and splenomegaly. . 10/16/2018 Left nephrectomy showed renal cell carcinoma, clear-cell type, nuclear grade 4, tumor extends into the renal vein and renal sinus fat.  Urethral, vascular and or resection margins are negative for tumor pT3a Nx Mx  01/28/2019 patient had another CT chest abdomen pelvis done with contrast Showed interval left nephrectomy.  Scattered tiny pulmonary nodules bilaterally.  Nonspecific.  No other evidence of metastatic disease.  Cirrhosis changes extensive coronary and aortic atherosclerosis  07/14/2019 patient underwent surveillance CT chest abdomen pelvis without contrast Interval progression of pulmonary metastasis with new enlarged right paratracheal lymph node 1.7 cm, previously 0.6 cm one-point since worrisome for metastatic adenopathy.  Multiple progressive pulmonary nodules, index nodule within the lingula measures 1.3 cm, previously  3 mm. Index subpleural nodule within the anterior right upper lobe measuring 1.8 cm, previously 3 cm Index nodule within the post lateral right lower lobe measuring 5 mm, new from previous care Aortic sclerosis.  Three-vessel coronary artery calcification noted.  Cirrhosis changes.  Splenomegaly.  Patient was referred to establish care with medical oncology for further discussion and evaluation of metastatic renal cell carcinoma. Patient reports feeling well at baseline today.  Denies any shortness of breath, cough, hemoptysis, back pain, headache. He quitted smoking 2015.  Not currently actively drinking alcohol as well. Patient has chronic back pain unchanged.  # Liver cirrhosis with small amount of ascites/splenomegaly patient sees gastroenterology Dr. [name].  . He will get ultrasound surveillance due in February 2021 for screening of [place].  EGD surveillance for Barrett's plan in April 2021.  # Diabetes, on Humalog sliding scale and metformin.  #Family history of cancer and personal history of RCC.  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  Status post ipilimumab and nivolumab treatment.  # 10/29/2019 CT chest abdomen pelvis without contrast images were independently reviewed by me and discussed with patient.   CT showed marked improvement with resolution of many of the pulmonary nodules, prominent reduced size of other nodules.  Considerably reduced size of the right lower paratracheal lymph node now 0.9 cm in diameter. Hepatic cirrhosis with prominent splenomegaly and trace perisplenic ascites. Chronic CT findings includes aortic atherosclerotic vascular disease.  Mild sigmoid colon diverticulosis  # CKD, he establish care with Dr. Candiss Norse for chronic  kidney disease.  Blood pressure medication has been adjusted.  Hydrochlorothiazide decreased to 12.5 mg daily.  INTERVAL HISTORY Henry Moore is a 59 y.o. male who has above history reviewed by me today presents for follow up visit for management of stage IV RCC  Problems and complaints are listed below: Patient denies any lightheadedness today. During last visit, he had hypotension and was advised to monitor blood pressure at home and titrate with his blood pressure medications. He is on amlodipine, losartan, carvedilol, hydrochlorothiazide. Today he informs me that he did not have home blood pressure machine available-he is going to get it tomorrow. So during interval, he stopped all the blood pressure medications.  Today he denies any chest pain, headache, focal deficit.  No other new complaints  Review of Systems  Constitutional: Positive for fatigue. Negative for appetite change, chills, fever and unexpected weight change.  HENT:   Negative for hearing loss and voice change.   Eyes: Negative for eye problems and icterus.  Respiratory: Negative for chest tightness, cough and shortness of breath.   Cardiovascular: Negative for chest pain and leg swelling.  Gastrointestinal: Negative for abdominal distention and abdominal pain.  Endocrine: Negative for hot flashes.  Genitourinary: Negative for difficulty urinating, dysuria and frequency.   Musculoskeletal: Negative for arthralgias.       Chronic back pain  Skin: Negative for itching and rash.  Neurological: Negative for light-headedness and numbness.  Hematological: Negative for adenopathy. Does not bruise/bleed easily.  Psychiatric/Behavioral: Negative for confusion.    MEDICAL HISTORY:  Past Medical History:  Diagnosis Date  . Cough    SINUS DRAINAGE CAUSING COUGHING , REPORTS CLEAR MUCOUS   . Diabetes mellitus without complication ([place])   . Family history of breast cancer   . Family history of colon cancer   . Family history  of stomach cancer   . HTN (hypertension)   . Hyperlipemia   . Left renal mass   . Platelets decreased ([place])    DENIES UNSUAL BLEEDING   . PONV (postoperative nausea and vomiting)   . Renal cell carcinoma ([place]) 08/04/2019  . WBC decreased     SURGICAL HISTORY: Past Surgical History:  Procedure Laterality Date  . ANKLE SURGERY Left   . ANTERIOR CERVICAL DECOMP/DISCECTOMY FUSION N/A 04/16/2014   Procedure: ANTERIOR CERVICAL DECOMPRESSION/DISCECTOMY FUSION  (ACDF C5-C7)   (2 LEVELS)     ;  Surgeon: Melina Schools, MD;  Location: [facility];  Service: Orthopedics;  Laterality: N/A;  . APPENDECTOMY    . BACK SURGERY    . ESOPHAGOGASTRODUODENOSCOPY (EGD) WITH PROPOFOL N/A 02/06/2019   Procedure: ESOPHAGOGASTRODUODENOSCOPY (EGD) WITH PROPOFOL;  Surgeon: [name], MD;  Location: [facility] ENDOSCOPY;  Service: Gastroenterology;  Laterality: N/A;  . ESOPHAGOGASTRODUODENOSCOPY (EGD) WITH PROPOFOL N/A 03/04/2019   Procedure: ESOPHAGOGASTRODUODENOSCOPY (EGD) WITH PROPOFOL;  Surgeon: Lin Landsman, MD;  Location: [facility] ENDOSCOPY;  Service: Gastroenterology;  Laterality: N/A;  . ESOPHAGOGASTRODUODENOSCOPY (EGD) WITH PROPOFOL N/A 04/22/2019   Procedure: ESOPHAGOGASTRODUODENOSCOPY (EGD) WITH PROPOFOL;  Surgeon: [name], MD;  Location: [facility] ENDOSCOPY;  Service: Gastroenterology;  Laterality: N/A;  . FRACTURE SURGERY    . HYDROCELE EXCISION    . KNEE ARTHROSCOPY Bilateral   . LAPAROSCOPIC NEPHRECTOMY, HAND ASSISTED Left 10/16/2018   Procedure: LEFT HAND ASSISTED LAPAROSCOPIC NEPHRECTOMY;  Surgeon: Lucas Mallow, MD;  Location: [facility];  Service: Urology;  Laterality: Left;  . NASAL SINUS SURGERY     X 2  . 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 PORTA CATH  INSERTION N/A 11/11/2019   Procedure: PORTA CATH INSERTION;  Surgeon: Katha Cabal, MD;  Location: [place] CV LAB;  Service: Cardiovascular;  Laterality: N/A;  . SHOULDER SURGERY Left     SOCIAL HISTORY: Social History    Socioeconomic History  . Marital status: Married    Spouse name: Not on file  . Number of children: Not on file  . Years of education: Not on file  . Highest education level: Not on file  Occupational History  . Not on file  Tobacco Use  . Smoking status: Former Smoker    Packs/day: 0.25    Years: 20.00    Pack years: 5.00    Quit date: 2015    Years since quitting: 6.7  . Smokeless tobacco: Never Used  Vaping Use  . Vaping Use: Never used  Substance and Sexual Activity  . Alcohol use: Not Currently    Alcohol/week: 0.0 standard drinks    Comment: no alchol in 1 year  . Drug use: No  . Sexual activity: Not on file  Other Topics Concern  . Not on file  Social History Narrative  . Not on file   Social Determinants of Health   Financial Resource Strain:   . Difficulty of Paying Living Expenses: Not on file  Food Insecurity:   . Worried About Charity fundraiser in the Last Year: Not on file  . Ran Out of Food in the Last Year: Not on file  Transportation Needs:   . Lack of Transportation (Medical): Not on file  . Lack of Transportation (Non-Medical): Not on file  Physical Activity:   . Days of Exercise per Week: Not on file  . Minutes of Exercise per Session: Not on file  Stress:   . Feeling of Stress : Not on file  Social Connections:   . Frequency of Communication with Friends and Family: Not on file  . Frequency of Social Gatherings with Friends and Family: Not on file  . Attends Religious Services: Not on file  . Active Member of Clubs or Organizations: Not on file  . Attends Archivist Meetings: Not on file  . Marital Status: Not on file  Intimate Partner Violence:   . Fear of Current or Ex-Partner: Not on file  . Emotionally Abused: Not on file  . Physically Abused: Not on file  . Sexually Abused: Not on file    FAMILY HISTORY: Family History  Problem Relation Age of Onset  . Diabetes Mother   . Cancer Mother        neuroendocrine cancer   . Cancer Father        neuroendocrine cancer of meninges  . Cancer Maternal Grandmother        tomach dx 45  . Alzheimer's disease Paternal Grandmother   . Lung cancer Paternal Grandfather   . Lung cancer Maternal Aunt   . Colon cancer Maternal Uncle   . Breast cancer Paternal Aunt        both dx 36s  . Ovarian cancer Other        dx 67  . Breast cancer Cousin     ALLERGIES:  is allergic to codeine and mucinex [guaifenesin er].  MEDICATIONS:  Current Outpatient Medications  Medication Sig Dispense Refill  . ACCU-CHEK AVIVA PLUS test strip CHECK BL00D SUGAR ONCE OR TWICE DAILY. 100 each PRN  . Cholecalciferol (VITAMIN D3) 125 MCG (5000 UT) CAPS Take 5,000 Units by mouth daily.    Marland Kitchen  glipiZIDE (GLUCOTROL) 5 MG tablet Take 1 tablet (5 mg total) by mouth daily before breakfast. 90 tablet 3  . losartan (COZAAR) 100 MG tablet Take 1 tablet by mouth daily. (Patient taking differently: Take 100 mg by mouth daily. ) 90 tablet 0  . lovastatin (MEVACOR) 40 MG tablet Take 1 tablet (40 mg total) by mouth at bedtime. 60 tablet 0  . metFORMIN (GLUCOPHAGE-XR) 500 MG 24 hr tablet Take 500 mg by mouth 2 (two) times daily.     . vitamin B-12 (CYANOCOBALAMIN) 1000 MCG tablet Take 1 tablet (1,000 mcg total) by mouth daily. 90 tablet 0  . amLODipine (NORVASC) 10 MG tablet Take 10 mg by mouth daily. (Patient not taking: Reported on 04/29/2020)    . carvedilol (COREG) 25 MG tablet Take 1 tablet (25 mg total) by mouth 2 (two) times daily with a meal. (Patient not taking: Reported on 04/29/2020) 180 tablet 3  . hydrochlorothiazide (HYDRODIURIL) 25 MG tablet TAKE 1 TABLET DAILY. (Patient not taking: Reported on 04/29/2020) 90 tablet 0  . insulin lispro (INSULIN LISPRO) 100 UNIT/ML KwikPen Junior Inject into the skin 3 (three) times daily as needed (Above 250). Sliding scale (Patient not taking: Reported on 02/05/2020)    . prochlorperazine (COMPAZINE) 10 MG tablet Take 1 tablet (10 mg total) by mouth every 6 (six)  hours as needed for nausea or vomiting. (Patient not taking: Reported on 04/15/2020) 30 tablet 0   No current facility-administered medications for this visit.   Facility-Administered Medications Ordered in Other Visits  Medication Dose Route Frequency Provider Last Rate Last Admin  . sodium chloride flush (NS) 0.9 % injection 10 mL  10 mL Intravenous PRN Earlie Server, MD   10 mL at 01/22/20 0831     PHYSICAL EXAMINATION: ECOG PERFORMANCE STATUS: 0 - Asymptomatic Vitals:   04/29/20 0903  BP: (!) 188/83  Pulse: (!) 105  Resp: 16  Temp: 98.4 F (36.9 C)   Filed Weights   04/29/20 0903  Weight: 253 lb (114.8 kg)    Physical Exam Constitutional:      General: He is not in acute distress.    Appearance: He is obese.  HENT:     Head: Normocephalic and atraumatic.  Eyes:     General: No scleral icterus. Cardiovascular:     Rate and Rhythm: Normal rate and regular rhythm.     Heart sounds: Normal heart sounds.  Pulmonary:     Effort: Pulmonary effort is normal. No respiratory distress.     Breath sounds: No wheezing.  Abdominal:     General: Bowel sounds are normal. There is no distension.     Palpations: Abdomen is soft.  Musculoskeletal:        General: No deformity. Normal range of motion.     Cervical back: Normal range of motion and neck supple.  Skin:    General: Skin is warm and dry.     Findings: No erythema or rash.  Neurological:     Mental Status: He is alert and oriented to person, place, and time. Mental status is at baseline.     Cranial Nerves: No cranial nerve deficit.     Coordination: Coordination normal.  Psychiatric:        Mood and Affect: Mood normal.     LABORATORY DATA:  I have reviewed the data as listed Lab Results  Component Value Date   WBC 2.2 (L) 04/29/2020   HGB 10.2 (L) 04/29/2020   HCT 28.9 (L) 04/29/2020  MCV 88.1 04/29/2020   PLT 87 (L) 04/29/2020   Recent Labs    04/01/20 0837 04/15/20 0853 04/29/20 0849  NA 139 135 138    K 4.3 4.3 4.2  CL 107 104 108  CO2 21* 21* 23  GLUCOSE 123* 162* 109*  BUN 24* 22* 20  CREATININE 1.69* 1.87* 1.58*  CALCIUM 8.5* 8.6* 8.7*  GFRNONAA 44* 39* 48*  GFRAA 51* 45* 55*  PROT 7.9 7.8 8.0  ALBUMIN 4.0 4.0 4.3  AST 31 34 29  ALT 24 27 21   ALKPHOS 102 117 108  BILITOT 0.9 0.8 1.5*   Iron/TIBC/Ferritin/ %Sat    Component Value Date/Time   IRON 52 12/18/2019 0826   IRON 105 10/15/2018 1325   TIBC 284 12/18/2019 0826   TIBC 244 (L) 10/15/2018 1325   FERRITIN 146 12/18/2019 0826   FERRITIN 588 (H) 10/15/2018 1325   IRONPCTSAT 18 12/18/2019 0826   IRONPCTSAT 43 10/15/2018 1325      RADIOGRAPHIC STUDIES: I have personally reviewed the radiological images as listed and agreed with the findings in the report. CT Abdomen Pelvis Wo Contrast  Result Date: 02/16/2020 CLINICAL DATA:  Restaging of metastatic left renal cell carcinoma, prior left nephrectomy. EXAM: CT CHEST, ABDOMEN AND PELVIS WITHOUT CONTRAST TECHNIQUE: Multidetector CT imaging of the chest, abdomen and pelvis was performed following the standard protocol without IV contrast. COMPARISON:  Multiple exams, including 10/29/2019 FINDINGS: CT CHEST FINDINGS Cardiovascular: Right Port-A-Cath tip: Right atrium. Coronary, aortic arch, and branch vessel atherosclerotic vascular disease. Mediastinum/Nodes: Further reduction in the size of the right paratracheal lymph node, currently 0.7 cm previously 1.8 cm on 07/14/2019 and previously 0.9 cm on 10/29/2019. Left internal mammary lymph nodes measure up to 0.5 cm in short axis (image 27/2), previously 0.6 cm on 10/29/2019. Lungs/Pleura: No new nodules are identified. Small subpleural nodules Adderall below 4 mm in diameter generally similar to prior. An index left lower lobe nodule measuring 3 mm in diameter on image 76/3 previously measured the same. Musculoskeletal: Lower cervical fusion. There several tiny sclerotic lesions for example in the right anterior fourth rib on image  63/3, these appear stable and there no compelling findings of osseous metastatic disease in the thorax. Thoracic spondylosis. CT ABDOMEN PELVIS FINDINGS Hepatobiliary: Nodular hepatic contour with prominence of the caudate lobe and left hepatic lobe compatible with hepatic cirrhosis, contour not appreciably changed. Borderline gallbladder wall thickening similar to prior. Pancreas: Unremarkable Spleen: Stable splenomegaly. Adrenals/Urinary Tract: Both adrenal glands appear normal. Left nephrectomy without visible local recurrence along the nephrectomy site. Right renal contour unremarkable. No appreciable urinary tract calculi. Stomach/Bowel: Unremarkable Vascular/Lymphatic: Aortoiliac atherosclerotic vascular disease. Scattered likely reactive lymph nodes along the gastrohepatic ligament, porta hepatis, and retroperitoneum. Aortocaval lymph node 0.9 cm in short axis on image 77/2, stable. Reproductive: Penile implant, reservoir in the left lower pelvis. Other: Trace ascites in the pelvis, perisplenic, perihepatic region, similar to prior. Stable mild mesenteric edema. Musculoskeletal: Unremarkable IMPRESSION: 1. No findings of active malignancy. 2. Further reduction in the size of the right paratracheal lymph node, currently 0.7 cm previously 1.8 cm on 07/14/2019 and previously 0.9 cm on 10/29/2019. 3. Stable small mostly subpleural nodules in the lungs, without new or enlarging nodule. 4. Other imaging findings of potential clinical significance: Coronary atherosclerosis. Hepatic cirrhosis with portal venous hypertension including splenomegaly and trace ascites. Stable mild mesenteric edema. Borderline gallbladder wall thickening, similar to prior. Left nephrectomy without visible local recurrence. 5. Aortic atherosclerosis. Aortic Atherosclerosis (ICD10-I70.0). Electronically Signed  By: Van Clines M.D.   On: 02/16/2020 08:02   CT Chest Wo Contrast  Result Date: 02/16/2020 CLINICAL DATA:  Restaging  of metastatic left renal cell carcinoma, prior left nephrectomy. EXAM: CT CHEST, ABDOMEN AND PELVIS WITHOUT CONTRAST TECHNIQUE: Multidetector CT imaging of the chest, abdomen and pelvis was performed following the standard protocol without IV contrast. COMPARISON:  Multiple exams, including 10/29/2019 FINDINGS: CT CHEST FINDINGS Cardiovascular: Right Port-A-Cath tip: Right atrium. Coronary, aortic arch, and branch vessel atherosclerotic vascular disease. Mediastinum/Nodes: Further reduction in the size of the right paratracheal lymph node, currently 0.7 cm previously 1.8 cm on 07/14/2019 and previously 0.9 cm on 10/29/2019. Left internal mammary lymph nodes measure up to 0.5 cm in short axis (image 27/2), previously 0.6 cm on 10/29/2019. Lungs/Pleura: No new nodules are identified. Small subpleural nodules Adderall below 4 mm in diameter generally similar to prior. An index left lower lobe nodule measuring 3 mm in diameter on image 76/3 previously measured the same. Musculoskeletal: Lower cervical fusion. There several tiny sclerotic lesions for example in the right anterior fourth rib on image 63/3, these appear stable and there no compelling findings of osseous metastatic disease in the thorax. Thoracic spondylosis. CT ABDOMEN PELVIS FINDINGS Hepatobiliary: Nodular hepatic contour with prominence of the caudate lobe and left hepatic lobe compatible with hepatic cirrhosis, contour not appreciably changed. Borderline gallbladder wall thickening similar to prior. Pancreas: Unremarkable Spleen: Stable splenomegaly. Adrenals/Urinary Tract: Both adrenal glands appear normal. Left nephrectomy without visible local recurrence along the nephrectomy site. Right renal contour unremarkable. No appreciable urinary tract calculi. Stomach/Bowel: Unremarkable Vascular/Lymphatic: Aortoiliac atherosclerotic vascular disease. Scattered likely reactive lymph nodes along the gastrohepatic ligament, porta hepatis, and retroperitoneum.  Aortocaval lymph node 0.9 cm in short axis on image 77/2, stable. Reproductive: Penile implant, reservoir in the left lower pelvis. Other: Trace ascites in the pelvis, perisplenic, perihepatic region, similar to prior. Stable mild mesenteric edema. Musculoskeletal: Unremarkable IMPRESSION: 1. No findings of active malignancy. 2. Further reduction in the size of the right paratracheal lymph node, currently 0.7 cm previously 1.8 cm on 07/14/2019 and previously 0.9 cm on 10/29/2019. 3. Stable small mostly subpleural nodules in the lungs, without new or enlarging nodule. 4. Other imaging findings of potential clinical significance: Coronary atherosclerosis. Hepatic cirrhosis with portal venous hypertension including splenomegaly and trace ascites. Stable mild mesenteric edema. Borderline gallbladder wall thickening, similar to prior. Left nephrectomy without visible local recurrence. 5. Aortic atherosclerosis. Aortic Atherosclerosis (ICD10-I70.0). Electronically Signed   By: Van Clines M.D.   On: 02/16/2020 08:02     ASSESSMENT & PLAN:  1. Renal cell carcinoma of left kidney (HCC)   2. Encounter for antineoplastic immunotherapy   3. Thrombocytopenia ([place])   4. Normocytic anemia   5. 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 Labs reviewed and discussed with patient I will hold off treatment today given that his blood pressure is high. Will reschedule treatment next few days   #Hypertension, Blood pressure is high due to not being on any blood pressure medication.  I had a discussion with him and advised patient to monitor blood pressure.  With today's reading, I recommend patient to resume losartan, carvedilol, hydrochlorothiazide.  If his home blood pressure is persistently above 150, he should resume amlodipine as well.  If his systolic blood pressure is persistently  above 180 despite taking all medications, he needs  to seek medical advice. Patient voices understanding of structures.  #Chronic thrombocytopenia and neutropenia, secondary to cirrhosis.  Patient's counts are stable.  Continue vitamin B12 supplementation.  #Chronic kidney disease, avoid nephrotoxins.  Kidney function stable.  #Chronic liver cirrhosis with thrombocytopenia and neutropenia.   #Anemia secondary to chronic kidney disease, stable, monitor blood work.  Follow-up 2 week lab MD for assessment evaluation prior to nivolumab. All questions were answered. The patient knows to call the clinic with any problems questions or concerns.   Earlie Server, MD, PhD Hematology Oncology [facility] at [facility] Pager- [phone] 04/29/2020

## 2020-05-03 ENCOUNTER — Telehealth: Payer: Self-pay | Admitting: *Deleted

## 2020-05-03 NOTE — Telephone Encounter (Signed)
Please cancel his appt for immunotherapy. Advise him to update [place] about covid test results. Thanks.

## 2020-05-03 NOTE — Telephone Encounter (Signed)
Patient informed that treatment tomorrow has been cancelled an to get COVID tested and to let us know results. He is in agreement with this plan and will call us back with results

## 2020-05-03 NOTE — Telephone Encounter (Signed)
Patient called reporting that he has an appointment tomorrow for immunotherapy and he is sick, coughing nasal congestion running down his throat, and "severe" diarrhea. He states his temp at night only goes up to 99.5, but is normal during the day. H states that the congestion is causing his ears to be affect and is "messing with my equilibrium" He is asking if he should reschedule his appointment. I recommended that he get COVID tested and he stated he cannot with the diarrhea and he has no one to go get an over the counter test today. Please advise

## 2020-05-04 ENCOUNTER — Inpatient Hospital Stay: Payer: BC Managed Care – PPO

## 2020-05-04 ENCOUNTER — Telehealth: Payer: Self-pay | Admitting: *Deleted

## 2020-05-04 ENCOUNTER — Other Ambulatory Visit: Payer: Self-pay | Admitting: Nurse Practitioner

## 2020-05-04 DIAGNOSIS — U071 COVID-19: Secondary | ICD-10-CM

## 2020-05-04 NOTE — Telephone Encounter (Signed)
Will give him a call today to discuss mab and symptom management.

## 2020-05-04 NOTE — Telephone Encounter (Signed)
Lauren or Sonia Baller Please see Dr Collie Siad note

## 2020-05-04 NOTE — Progress Notes (Signed)
I connected by phone with Henry Moore on 05/04/2020 at 2:57 PM to discuss the potential use of an new treatment for mild to moderate COVID-19 viral infection in non-hospitalized patients.  This patient is a 59 y.o. male that meets the FDA criteria for Emergency Use Authorization of casirivimab\imdevimab.  Has a (+) direct SARS-CoV-2 viral test result  Has mild or moderate COVID-19   Is ? 57 years of age and weighs ? 40 kg  Is NOT hospitalized due to COVID-19  Is NOT requiring oxygen therapy or requiring an increase in baseline oxygen flow rate due to COVID-19  Is within 10 days of symptom onset  Has at least one of the high risk factor(s) for progression to severe COVID-19 and/or hospitalization as defined in EUA.  Specific high risk criteria : BMI > 25, Diabetes, Immunosuppressive Disease or Treatment and Cardiovascular disease or hypertension   Onset 04/30/20.    I have spoken and communicated the following to the patient or parent/caregiver:  1. FDA has authorized the emergency use of casirivimab\imdevimab for the treatment of mild to moderate COVID-19 in adults and pediatric patients with positive results of direct SARS-CoV-2 viral testing who are 14 years of age and older weighing at least 40 kg, and who are at high risk for progressing to severe COVID-19 and/or hospitalization.  2. The significant known and potential risks and benefits of casirivimab\imdevimab, and the extent to which such potential risks and benefits are unknown.  3. Information on available alternative treatments and the risks and benefits of those alternatives, including clinical trials.  4. Patients treated with casirivimab\imdevimab should continue to self-isolate and use infection control measures (e.g., wear mask, isolate, social distance, avoid sharing personal items, clean and disinfect "high touch" surfaces, and frequent handwashing) according to CDC guidelines.   5. The patient or parent/caregiver  has the option to accept or refuse casirivimab\imdevimab .  After reviewing this information with the patient, the patient has agreed to receive one of the available covid 19 monoclonal antibodies and will be provided an appropriate fact sheet prior to infusion.Beckey Rutter, [place], AGNP-C [phone] (Uncertain)

## 2020-05-04 NOTE — Telephone Encounter (Signed)
Can out NP see him for telemedicine for covid  and check if he is qualified for Mabs?

## 2020-05-04 NOTE — Telephone Encounter (Signed)
Patient called reporting that he has tested positive for COVID,he feels dehydrated as he is having diarrhea every 15 minutes and his temp is up to 102. I advised that he call his PCP and told him I would let you know as well. Is there anything else he needs to do?

## 2020-05-05 ENCOUNTER — Other Ambulatory Visit (HOSPITAL_COMMUNITY): Payer: Self-pay

## 2020-05-05 ENCOUNTER — Ambulatory Visit (HOSPITAL_COMMUNITY)
Admission: RE | Admit: 2020-05-05 | Discharge: 2020-05-05 | Disposition: A | Discharge status: Home / Self Care | Payer: BC Managed Care – PPO | Source: Ambulatory Visit | Attending: Pulmonary Disease | Admitting: Pulmonary Disease

## 2020-05-05 DIAGNOSIS — U071 COVID-19: Secondary | ICD-10-CM | POA: Insufficient documentation

## 2020-05-05 MED ORDER — EPINEPHRINE 0.3 MG/0.3ML IJ SOAJ: 0.3000 mg | Freq: Once | INTRAMUSCULAR | Status: DC | PRN

## 2020-05-05 MED ORDER — SODIUM CHLORIDE 0.9 % IV SOLN: INTRAVENOUS | Status: DC | PRN

## 2020-05-05 MED ORDER — ALBUTEROL SULFATE HFA 108 (90 BASE) MCG/ACT IN AERS: 2.0000 | INHALATION_SPRAY | Freq: Once | RESPIRATORY_TRACT | Status: DC | PRN

## 2020-05-05 MED ORDER — SODIUM CHLORIDE 0.9 % IV SOLN
1200.0000 mg | Freq: Once | INTRAVENOUS | Status: AC
  Administered 2020-05-05: 1200 mg via INTRAVENOUS

## 2020-05-05 MED ORDER — DIPHENHYDRAMINE HCL 50 MG/ML IJ SOLN: 50.0000 mg | Freq: Once | INTRAMUSCULAR | Status: DC | PRN

## 2020-05-05 MED ORDER — METHYLPREDNISOLONE SODIUM SUCC 125 MG IJ SOLR: 125.0000 mg | Freq: Once | INTRAMUSCULAR | Status: DC | PRN

## 2020-05-05 MED ORDER — FAMOTIDINE IN NACL 20-0.9 MG/50ML-% IV SOLN: 20.0000 mg | Freq: Once | INTRAVENOUS | Status: DC | PRN

## 2020-05-05 NOTE — Discharge Instructions (Signed)

## 2020-05-05 NOTE — Progress Notes (Signed)
  Diagnosis: COVID-19  Physician: Asencion Noble  Procedure: Covid Infusion Clinic Med: casirivimab\imdevimab infusion - Provided patient with casirivimab\imdevimab fact sheet for patients, parents and caregivers prior to infusion.  Complications: No immediate complications noted.  Discharge: Discharged home   [place] 05/05/2020

## 2020-05-07 ENCOUNTER — Telehealth: Payer: Self-pay

## 2020-05-07 NOTE — Telephone Encounter (Signed)
error 

## 2020-05-18 ENCOUNTER — Inpatient Hospital Stay: Payer: BC Managed Care – PPO

## 2020-05-18 ENCOUNTER — Telehealth: Payer: Self-pay | Admitting: *Deleted

## 2020-05-18 ENCOUNTER — Inpatient Hospital Stay: Payer: BC Managed Care – PPO | Admitting: Oncology

## 2020-05-18 NOTE — Telephone Encounter (Signed)
Pt stated that he tested postive for Covid19 almost 2 weeks ago and says that he's still very sick . He stated that he would call back to have 10/19 appt R/S for a later date. Pt asked if RN could give him a call back he has some concerns.

## 2020-05-19 NOTE — Telephone Encounter (Signed)
Done Pt is aware of his sched 05/21/20 appt for  lab/MD/ +/- IVF

## 2020-05-19 NOTE — Telephone Encounter (Signed)
Called patient but no answer and voicemail box is full. 

## 2020-05-19 NOTE — Telephone Encounter (Signed)
Ok. Can we do Lab md IVF this week? Does he still have a lot respiratory symptoms? If yes, check CXR thanks.

## 2020-05-19 NOTE — Telephone Encounter (Signed)
Returned call to patient.  Patient tested positive for COVID on 05/04/20 and received monoclonal antibodies on 05/05/20.  While at the [facility] his bp was low at 94/60.  Reports drinking plenty of fluids now but not eating much due to loss of taste and smell.  Checks blood pressure at home with results around 130s/65.   He feels that he needs IVF because he is still feeling weak and dizzy with no further fever or diarrhea.    Patient did not come to appt yesterday because he did not get a reminder on his phone.  Please advise.

## 2020-05-19 NOTE — Telephone Encounter (Signed)
Patient is not having respiratory symptoms other than occasional cough.  Please schedule patient for lab/MD/poss IVF this week, if possible and inform him of appt details.

## 2020-05-21 ENCOUNTER — Encounter: Payer: Self-pay | Admitting: Oncology

## 2020-05-21 ENCOUNTER — Other Ambulatory Visit: Payer: Self-pay

## 2020-05-21 ENCOUNTER — Inpatient Hospital Stay: Payer: BC Managed Care – PPO | Attending: Oncology

## 2020-05-21 ENCOUNTER — Inpatient Hospital Stay: Payer: BC Managed Care – PPO

## 2020-05-21 ENCOUNTER — Inpatient Hospital Stay (HOSPITAL_BASED_OUTPATIENT_CLINIC_OR_DEPARTMENT_OTHER): Payer: BC Managed Care – PPO | Admitting: Oncology

## 2020-05-21 VITALS — BP 126/77 | HR 87 | Temp 98.9°F | Wt 234.0 lb

## 2020-05-21 DIAGNOSIS — C642 Malignant neoplasm of left kidney, except renal pelvis: Secondary | ICD-10-CM | POA: Diagnosis not present

## 2020-05-21 DIAGNOSIS — R634 Abnormal weight loss: Secondary | ICD-10-CM | POA: Insufficient documentation

## 2020-05-21 DIAGNOSIS — K5730 Diverticulosis of large intestine without perforation or abscess without bleeding: Secondary | ICD-10-CM | POA: Insufficient documentation

## 2020-05-21 DIAGNOSIS — E1122 Type 2 diabetes mellitus with diabetic chronic kidney disease: Secondary | ICD-10-CM | POA: Diagnosis not present

## 2020-05-21 DIAGNOSIS — I10 Essential (primary) hypertension: Secondary | ICD-10-CM | POA: Insufficient documentation

## 2020-05-21 DIAGNOSIS — M549 Dorsalgia, unspecified: Secondary | ICD-10-CM | POA: Insufficient documentation

## 2020-05-21 DIAGNOSIS — Z801 Family history of malignant neoplasm of trachea, bronchus and lung: Secondary | ICD-10-CM | POA: Diagnosis not present

## 2020-05-21 DIAGNOSIS — Z794 Long term (current) use of insulin: Secondary | ICD-10-CM | POA: Diagnosis not present

## 2020-05-21 DIAGNOSIS — Z79899 Other long term (current) drug therapy: Secondary | ICD-10-CM | POA: Insufficient documentation

## 2020-05-21 DIAGNOSIS — R188 Other ascites: Secondary | ICD-10-CM | POA: Diagnosis not present

## 2020-05-21 DIAGNOSIS — Z800 Family history of malignant neoplasm of digestive organs: Secondary | ICD-10-CM | POA: Diagnosis not present

## 2020-05-21 DIAGNOSIS — I2510 Atherosclerotic heart disease of native coronary artery without angina pectoris: Secondary | ICD-10-CM | POA: Insufficient documentation

## 2020-05-21 DIAGNOSIS — K7460 Unspecified cirrhosis of liver: Secondary | ICD-10-CM | POA: Insufficient documentation

## 2020-05-21 DIAGNOSIS — Z803 Family history of malignant neoplasm of breast: Secondary | ICD-10-CM | POA: Diagnosis not present

## 2020-05-21 DIAGNOSIS — C649 Malignant neoplasm of unspecified kidney, except renal pelvis: Secondary | ICD-10-CM

## 2020-05-21 DIAGNOSIS — R059 Cough, unspecified: Secondary | ICD-10-CM | POA: Diagnosis not present

## 2020-05-21 DIAGNOSIS — D649 Anemia, unspecified: Secondary | ICD-10-CM | POA: Insufficient documentation

## 2020-05-21 DIAGNOSIS — D696 Thrombocytopenia, unspecified: Secondary | ICD-10-CM

## 2020-05-21 DIAGNOSIS — R161 Splenomegaly, not elsewhere classified: Secondary | ICD-10-CM | POA: Diagnosis not present

## 2020-05-21 DIAGNOSIS — Z8616 Personal history of COVID-19: Secondary | ICD-10-CM | POA: Diagnosis not present

## 2020-05-21 DIAGNOSIS — E785 Hyperlipidemia, unspecified: Secondary | ICD-10-CM | POA: Insufficient documentation

## 2020-05-21 DIAGNOSIS — Z87891 Personal history of nicotine dependence: Secondary | ICD-10-CM | POA: Insufficient documentation

## 2020-05-21 DIAGNOSIS — C787 Secondary malignant neoplasm of liver and intrahepatic bile duct: Secondary | ICD-10-CM | POA: Insufficient documentation

## 2020-05-21 LAB — CBC WITH DIFFERENTIAL/PLATELET
Abs Immature Granulocytes: 0.04 10*3/uL (ref 0.00–0.07)
Basophils Absolute: 0 10*3/uL (ref 0.0–0.1)
Basophils Relative: 1 %
Eosinophils Absolute: 0.1 10*3/uL (ref 0.0–0.5)
Eosinophils Relative: 2 %
HCT: 28.1 % — ABNORMAL LOW (ref 39.0–52.0)
Hemoglobin: 9.5 g/dL — ABNORMAL LOW (ref 13.0–17.0)
Immature Granulocytes: 1 %
Lymphocytes Relative: 9 %
Lymphs Abs: 0.3 10*3/uL — ABNORMAL LOW (ref 0.7–4.0)
MCH: 30.2 pg (ref 26.0–34.0)
MCHC: 33.8 g/dL (ref 30.0–36.0)
MCV: 89.2 fL (ref 80.0–100.0)
Monocytes Absolute: 0.3 10*3/uL (ref 0.1–1.0)
Monocytes Relative: 10 %
Neutro Abs: 2.2 10*3/uL (ref 1.7–7.7)
Neutrophils Relative %: 77 %
Platelets: 136 10*3/uL — ABNORMAL LOW (ref 150–400)
RBC: 3.15 MIL/uL — ABNORMAL LOW (ref 4.22–5.81)
RDW: 13.3 % (ref 11.5–15.5)
WBC: 3 10*3/uL — ABNORMAL LOW (ref 4.0–10.5)
nRBC: 0 % (ref 0.0–0.2)

## 2020-05-21 LAB — COMPREHENSIVE METABOLIC PANEL
ALT: 47 U/L — ABNORMAL HIGH (ref 0–44)
AST: 55 U/L — ABNORMAL HIGH (ref 15–41)
Albumin: 3.1 g/dL — ABNORMAL LOW (ref 3.5–5.0)
Alkaline Phosphatase: 126 U/L (ref 38–126)
Anion gap: 6 (ref 5–15)
BUN: 32 mg/dL — ABNORMAL HIGH (ref 6–20)
CO2: 21 mmol/L — ABNORMAL LOW (ref 22–32)
Calcium: 8.7 mg/dL — ABNORMAL LOW (ref 8.9–10.3)
Chloride: 108 mmol/L (ref 98–111)
Creatinine, Ser: 1.75 mg/dL — ABNORMAL HIGH (ref 0.61–1.24)
GFR, Estimated: 45 mL/min — ABNORMAL LOW (ref >60)
Glucose, Bld: 156 mg/dL — ABNORMAL HIGH (ref 70–99)
Potassium: 4.9 mmol/L (ref 3.5–5.1)
Sodium: 135 mmol/L (ref 135–145)
Total Bilirubin: 1.1 mg/dL (ref 0.3–1.2)
Total Protein: 7.9 g/dL (ref 6.5–8.1)

## 2020-05-21 MED ORDER — SODIUM CHLORIDE 0.9 % IV SOLN
Freq: Once | INTRAVENOUS | Status: AC
  Filled 2020-05-21: qty 250

## 2020-05-21 NOTE — Progress Notes (Signed)
Since patient has been diagnosed with COVID he is feeling weaker and dizzy.  Reports multiple falls during his COVID battle with the last fall being 7 days ago.  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

## 2020-05-21 NOTE — Progress Notes (Signed)
Hematology/Oncology follow-up [facility] [phone] [phone]   Patient Care Team: Albina Billet, MD as PCP - General (Internal Medicine) Earlie Server, MD as Consulting Physician (Hematology and Oncology)  REFERRING PROVIDER: Albina Billet, MD  CHIEF COMPLAINTS/REASON FOR VISIT:  Follow-up for kidney cancer treatments  HISTORY OF PRESENTING ILLNESS:   Henry Moore is a  59 y.o.  male with PMH listed below was seen in consultation at the request of  Albina Billet, MD  for evaluation of kidney cancer Extensive medical records were reviewed Patient had MRI lumbar spine without contrast done on 07/13/2018 which showed mired lumbar degenerative disease. CT thoracic spine was done on 08/05/2018 which showed 5 cm left renal mass. 09/04/2018 CT abdomen and pelvis with and without contrast showed 5 cm round enhancing solid mass in the left kidney.  No involvement of left renal vein.  No lymphadenopathy Morphologic changes consistent with cirrhosis and the portal hypertension.  Small volume ascites and splenomegaly. . 10/16/2018 Left nephrectomy showed renal cell carcinoma, clear-cell type, nuclear grade 4, tumor extends into the renal vein and renal sinus fat.  Urethral, vascular and or resection margins are negative for tumor pT3a Nx Mx  01/28/2019 patient had another CT chest abdomen pelvis done with contrast Showed interval left nephrectomy.  Scattered tiny pulmonary nodules bilaterally.  Nonspecific.  No other evidence of metastatic disease.  Cirrhosis changes extensive coronary and aortic atherosclerosis  07/14/2019 patient underwent surveillance CT chest abdomen pelvis without contrast Interval progression of pulmonary metastasis with new enlarged right paratracheal lymph node 1.7 cm, previously 0.6 cm one-point since worrisome for metastatic adenopathy.  Multiple progressive pulmonary nodules, index nodule within the lingula measures 1.3 cm, previously  3 mm. Index subpleural nodule within the anterior right upper lobe measuring 1.8 cm, previously 3 cm Index nodule within the post lateral right lower lobe measuring 5 mm, new from previous care Aortic sclerosis.  Three-vessel coronary artery calcification noted.  Cirrhosis changes.  Splenomegaly.  Patient was referred to establish care with medical oncology for further discussion and evaluation of metastatic renal cell carcinoma. Patient reports feeling well at baseline today.  Denies any shortness of breath, cough, hemoptysis, back pain, headache. He quitted smoking 2015.  Not currently actively drinking alcohol as well. Patient has chronic back pain unchanged.  # Liver cirrhosis with small amount of ascites/splenomegaly patient sees gastroenterology Dr. [name].  . He will get ultrasound surveillance due in February 2021 for screening of [place].  EGD surveillance for Barrett's plan in April 2021.  # Diabetes, on Humalog sliding scale and metformin.  #Family history of cancer and personal history of RCC.  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  Status post ipilimumab and nivolumab treatment.  # 10/29/2019 CT chest abdomen pelvis without contrast images were independently reviewed by me and discussed with patient.   CT showed marked improvement with resolution of many of the pulmonary nodules, prominent reduced size of other nodules.  Considerably reduced size of the right lower paratracheal lymph node now 0.9 cm in diameter. Hepatic cirrhosis with prominent splenomegaly and trace perisplenic ascites. Chronic CT findings includes aortic atherosclerotic vascular disease.  Mild sigmoid colon diverticulosis  # CKD, he establish care with Dr. Candiss Norse for chronic  kidney disease.  Blood pressure medication has been adjusted.  Hydrochlorothiazide decreased to 12.5 mg daily.  INTERVAL HISTORY Henry Moore is a 59 y.o. male who has above history reviewed by me today presents for follow up visit for management of stage IV RCC  Problems and complaints are listed below: Patient has had COVID-19 infection recently. He reports feeling profoundly weak, diarrhea nausea and vomiting as his main symptoms.  He has been seen by COVID-19 clinic and received monoclonal antibody infusion.  He has lost 19 pounds since the COVID-19 infection. He reports feeling a lot better comparing to the worst time.  Still not back to the baseline yet.  Continues to have severe fatigue, no taste no smell, decreased appetite.  No nausea vomiting diarrhea currently. Some cough, no shortness of breath.   Review of Systems  Constitutional: Positive for appetite change, fatigue and unexpected weight change. Negative for chills and fever.  HENT:   Negative for hearing loss and voice change.   Eyes: Negative for eye problems and icterus.  Respiratory: Negative for chest tightness, cough and shortness of breath.   Cardiovascular: Negative for chest pain and leg swelling.  Gastrointestinal: Negative for abdominal distention and abdominal pain.  Endocrine: Negative for hot flashes.  Genitourinary: Negative for difficulty urinating, dysuria and frequency.   Musculoskeletal: Negative for arthralgias.       Chronic back pain  Skin: Negative for itching and rash.  Neurological: Negative for light-headedness and numbness.  Hematological: Negative for adenopathy. Does not bruise/bleed easily.  Psychiatric/Behavioral: Negative for confusion.    MEDICAL HISTORY:  Past Medical History:  Diagnosis Date  . Cough    SINUS DRAINAGE CAUSING COUGHING , REPORTS CLEAR MUCOUS   . Diabetes mellitus without complication ([place])   . Family history of breast cancer   . Family history of colon cancer   .  Family history of stomach cancer   . HTN (hypertension)   . Hyperlipemia   . Left renal mass   . Platelets decreased ([place])    DENIES UNSUAL BLEEDING   . PONV (postoperative nausea and vomiting)   . Renal cell carcinoma ([place]) 08/04/2019  . WBC decreased     SURGICAL HISTORY: Past Surgical History:  Procedure Laterality Date  . ANKLE SURGERY Left   . ANTERIOR CERVICAL DECOMP/DISCECTOMY FUSION N/A 04/16/2014   Procedure: ANTERIOR CERVICAL DECOMPRESSION/DISCECTOMY FUSION  (ACDF C5-C7)   (2 LEVELS)     ;  Surgeon: Melina Schools, MD;  Location: [place];  Service: Orthopedics;  Laterality: N/A;  . APPENDECTOMY    . BACK SURGERY    . ESOPHAGOGASTRODUODENOSCOPY (EGD) WITH PROPOFOL N/A 02/06/2019   Procedure: ESOPHAGOGASTRODUODENOSCOPY (EGD) WITH PROPOFOL;  Surgeon: [name], MD;  Location[facility] END[facility]COPY;  Service: Gastroenterology;  Laterality: N/A;  . ESOPHAGOGASTRODUODENOSCOPY (EGD) WITH PROPOFOL N/A 03/04/2019   Procedure: ESOPHAGOGASTRODUODENOSCOPY (EGD) WITH PROPOFOL;  Surgeon: Lin Landsman, MD;  Location: [facility] ENDOSCOPY;  Service: Gastroenterology;  Laterality: N/A;  . ESOPHAGOGASTRODUODENOSCOPY (EGD) WITH PROPOFOL N/A 04/22/2019   Procedure: ESOPHAGOGASTRODUODENOSCOPY (EGD) WITH PROPOFOL;  Surgeon: [name], MD;  Location: [facility] ENDOSCOPY;  Service: Gastroenterology;  Laterality: N/A;  . FRACTURE SURGERY    . HYDROCELE EXCISION    . KNEE ARTHROSCOPY Bilateral   . LAPAROSCOPIC NEPHRECTOMY, HAND ASSISTED Left 10/16/2018   Procedure: LEFT HAND ASSISTED LAPAROSCOPIC NEPHRECTOMY;  Surgeon: Lucas Mallow, MD;  Location: [facility];  Service: Urology;  Laterality: Left;  . NASAL SINUS SURGERY     X 2  . 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 PORTA CATH INSERTION N/A 11/11/2019   Procedure: PORTA CATH INSERTION;  Surgeon: Katha Cabal, MD;  Location: [place] [facility];  Service: Cardiovascular;  Laterality: N/A;  . SHOULDER SURGERY Left     SOCIAL HISTORY: Social History     Socioeconomic History  . Marital status: Married    Spouse name: Not on file  . Number of children: Not on file  . Years of education: Not on file  . Highest education level: Not on file  Occupational History  . Not on file  Tobacco Use  . Smoking status: Former Smoker    Packs/day: 0.25    Years: 20.00    Pack years: 5.00    Quit date: 2015    Years since quitting: 6.8  . Smokeless tobacco: Never Used  Vaping Use  . Vaping Use: Never used  Substance and Sexual Activity  . Alcohol use: Not Currently    Alcohol/week: 0.0 standard drinks    Comment: no alchol in 1 year  . Drug use: No  . Sexual activity: Not on file  Other Topics Concern  . Not on file  Social History Narrative  . Not on file   Social Determinants of Health   Financial Resource Strain:   . Difficulty of Paying Living Expenses: Not on file  Food Insecurity:   . Worried About Charity fundraiser in the Last Year: Not on file  . Ran Out of Food in the Last Year: Not on file  Transportation Needs:   . Lack of Transportation (Medical): Not on file  . Lack of Transportation (Non-Medical): Not on file  Physical Activity:   . Days of Exercise per Week: Not on file  . Minutes of Exercise per Session: Not on file  Stress:   . Feeling of Stress : Not on file  Social Connections:   . Frequency of Communication with Friends and Family: Not on file  . Frequency of Social Gatherings with Friends and Family: Not on file  . Attends Religious Services: Not on file  . Active Member of Clubs or Organizations: Not on file  . Attends Archivist Meetings: Not on file  . Marital Status: Not on file  Intimate Partner Violence:   . Fear of Current or Ex-Partner: Not on file  . Emotionally Abused: Not on file  . Physically Abused: Not on file  . Sexually Abused: Not on file    FAMILY HISTORY: Family History  Problem Relation Age of Onset  . Diabetes Mother   . Cancer Mother        neuroendocrine cancer   . Cancer Father        neuroendocrine cancer of meninges  . Cancer Maternal Grandmother        tomach dx 6  . Alzheimer's disease Paternal Grandmother   . Lung cancer Paternal Grandfather   . Lung cancer Maternal Aunt   . Colon cancer Maternal Uncle   . Breast cancer Paternal Aunt        both dx 49s  . Ovarian cancer Other        dx 15  . Breast cancer Cousin     ALLERGIES:  is allergic to codeine and mucinex [guaifenesin er].  MEDICATIONS:  Current Outpatient Medications  Medication Sig Dispense Refill  . ACCU-CHEK AVIVA PLUS test strip CHECK BL00D SUGAR ONCE OR TWICE DAILY. 100 each PRN  . carvedilol (COREG) 25 MG tablet Take 1 tablet (25 mg  total) by mouth 2 (two) times daily with a meal. 180 tablet 3  . Cholecalciferol (VITAMIN D3) 125 MCG (5000 UT) CAPS Take 5,000 Units by mouth daily.    Marland Kitchen glipiZIDE (GLUCOTROL) 5 MG tablet Take 1 tablet (5 mg total) by mouth daily before breakfast. 90 tablet 3  . losartan (COZAAR) 100 MG tablet Take 1 tablet by mouth daily. (Patient taking differently: Take 100 mg by mouth daily. ) 90 tablet 0  . lovastatin (MEVACOR) 40 MG tablet Take 1 tablet (40 mg total) by mouth at bedtime. 60 tablet 0  . metFORMIN (GLUCOPHAGE-XR) 500 MG 24 hr tablet Take 500 mg by mouth 2 (two) times daily.     . vitamin B-12 (CYANOCOBALAMIN) 1000 MCG tablet Take 1 tablet (1,000 mcg total) by mouth daily. 90 tablet 0  . amLODipine (NORVASC) 10 MG tablet Take 10 mg by mouth daily. (Patient not taking: Reported on 04/29/2020)    . hydrochlorothiazide (HYDRODIURIL) 25 MG tablet TAKE 1 TABLET DAILY. (Patient not taking: Reported on 04/29/2020) 90 tablet 0  . insulin lispro (INSULIN LISPRO) 100 UNIT/ML KwikPen Junior Inject into the skin 3 (three) times daily as needed (Above 250). Sliding scale (Patient not taking: Reported on 02/05/2020)    . prochlorperazine (COMPAZINE) 10 MG tablet Take 1 tablet (10 mg total) by mouth every 6 (six) hours as needed for nausea or vomiting.  (Patient not taking: Reported on 04/15/2020) 30 tablet 0   No current facility-administered medications for this visit.   Facility-Administered Medications Ordered in Other Visits  Medication Dose Route Frequency Provider Last Rate Last Admin  . sodium chloride flush (NS) 0.9 % injection 10 mL  10 mL Intravenous PRN Earlie Server, MD   10 mL at 01/22/20 0831     PHYSICAL EXAMINATION: ECOG PERFORMANCE STATUS: 0 - Asymptomatic Vitals:   05/21/20 1359  BP: 126/77  Pulse: 87  Temp: 98.9 F (37.2 C)  SpO2: 97%   Filed Weights   05/21/20 1359  Weight: 234 lb (106.1 kg)    Physical Exam Constitutional:      General: He is not in acute distress.    Appearance: He is obese.  HENT:     Head: Normocephalic and atraumatic.  Eyes:     General: No scleral icterus. Cardiovascular:     Rate and Rhythm: Normal rate and regular rhythm.     Heart sounds: Normal heart sounds.  Pulmonary:     Effort: Pulmonary effort is normal. No respiratory distress.     Breath sounds: No wheezing.  Abdominal:     General: Bowel sounds are normal. There is no distension.     Palpations: Abdomen is soft.  Musculoskeletal:        General: No deformity. Normal range of motion.     Cervical back: Normal range of motion and neck supple.  Skin:    General: Skin is warm and dry.     Findings: No erythema or rash.  Neurological:     Mental Status: He is alert and oriented to person, place, and time. Mental status is at baseline.     Cranial Nerves: No cranial nerve deficit.     Coordination: Coordination normal.  Psychiatric:        Mood and Affect: Mood normal.     LABORATORY DATA:  I have reviewed the data as listed Lab Results  Component Value Date   WBC 3.0 (L) 05/21/2020   HGB 9.5 (L) 05/21/2020   HCT 28.1 (L) 05/21/2020  MCV 89.2 05/21/2020   PLT 136 (L) 05/21/2020   Recent Labs    04/01/20 0837 04/01/20 0837 04/15/20 0853 04/29/20 0849 05/21/20 1253  NA 139   < > 135 138 135  K 4.3    < > 4.3 4.2 4.9  CL 107   < > 104 108 108  CO2 21*   < > 21* 23 21*  GLUCOSE 123*   < > 162* 109* 156*  BUN 24*   < > 22* 20 32*  CREATININE 1.69*   < > 1.87* 1.58* 1.75*  CALCIUM 8.5*   < > 8.6* 8.7* 8.7*  GFRNONAA 44*   < > 39* 48* 45*  GFRAA 51*  --  45* 55*  --   PROT 7.9   < > 7.8 8.0 7.9  ALBUMIN 4.0   < > 4.0 4.3 3.1*  AST 31   < > 34 29 55*  ALT 24   < > 27 21 47*  ALKPHOS 102   < > 117 108 126  BILITOT 0.9   < > 0.8 1.5* 1.1   < > = values in this interval not displayed.   Iron/TIBC/Ferritin/ %Sat    Component Value Date/Time   IRON 52 12/18/2019 0826   IRON 105 10/15/2018 1325   TIBC 284 12/18/2019 0826   TIBC 244 (L) 10/15/2018 1325   FERRITIN 146 12/18/2019 0826   FERRITIN 588 (H) 10/15/2018 1325   IRONPCTSAT 18 12/18/2019 0826   IRONPCTSAT 43 10/15/2018 1325      RADIOGRAPHIC STUDIES: I have personally reviewed the radiological images as listed and agreed with the findings in the report. No results found.   ASSESSMENT & PLAN:  1. Renal cell carcinoma, unspecified laterality ([place])   2. Thrombocytopenia ([place])   3. Normocytic anemia   4. 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 Hold immunotherapy due to recent COVID-19 infection.  I will hold off obtaining CT scanning at this point.  Will allow patient to complete recover from COVID-19 infection.  #Decreased oral intake likely contributed to his weight loss.  Patient will receive 1 L of IV fluid today as hydration session. #Chronic thrombocytopenia and neutropenia, secondary to cirrhosis.  Counts are stable.  #Chronic kidney disease, avoid nephrotoxins.  Worsening of creatinine likely due to dehydration.  IV fluid normal saline x1.  #Chronic liver cirrhosis with thrombocytopenia and neutropenia.   #Recent COVID-19 infection, recommend patient continue follow-up with primary  care provider for further management of symptoms from COVID-19 if needed.  Follow-up 4 week lab MD for assessment evaluation prior to nivolumab. All questions were answered. The patient knows to call the clinic with any problems questions or concerns.   Earlie Server, MD, PhD Hematology Oncology [facility] at [facility] Pager- [phone] 05/21/2020

## 2020-05-31 ENCOUNTER — Inpatient Hospital Stay: Payer: Medicare Other | Attending: Hospice and Palliative Medicine | Admitting: Hospice and Palliative Medicine

## 2020-05-31 DIAGNOSIS — D709 Neutropenia, unspecified: Secondary | ICD-10-CM | POA: Insufficient documentation

## 2020-05-31 DIAGNOSIS — Z515 Encounter for palliative care: Secondary | ICD-10-CM

## 2020-05-31 DIAGNOSIS — N1832 Chronic kidney disease, stage 3b: Secondary | ICD-10-CM | POA: Insufficient documentation

## 2020-05-31 DIAGNOSIS — C649 Malignant neoplasm of unspecified kidney, except renal pelvis: Secondary | ICD-10-CM | POA: Insufficient documentation

## 2020-05-31 DIAGNOSIS — C7800 Secondary malignant neoplasm of unspecified lung: Secondary | ICD-10-CM | POA: Insufficient documentation

## 2020-05-31 DIAGNOSIS — Z5112 Encounter for antineoplastic immunotherapy: Secondary | ICD-10-CM | POA: Insufficient documentation

## 2020-05-31 DIAGNOSIS — K7460 Unspecified cirrhosis of liver: Secondary | ICD-10-CM | POA: Insufficient documentation

## 2020-05-31 DIAGNOSIS — D696 Thrombocytopenia, unspecified: Secondary | ICD-10-CM | POA: Insufficient documentation

## 2020-05-31 NOTE — Progress Notes (Signed)
Virtual Visit via telephone note  I connected with Raja L Christy on 05/31/20 at  1:00 PM EDT by telephone and verified that I am speaking with the correct person using two identifiers.   I discussed the limitations, risks, security and privacy concerns of performing an evaluation and management service by telephone and the availability of in person appointments. I also discussed with the patient that there may be a patient responsible charge related to this service. The patient expressed understanding and agreed to proceed.   History of Present Illness: Henry Moore is a 59 y.o. 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    CT 10/29/2019 revealed marked improvement in tumor burden.  CT 02/13/2020 are without findings of active malignancy.  Patient was referred to palliative care to help address goals and manage ongoing symptoms.   Observations/Objective: I called and spoke with patient by phone.  He reports that he is slowly improving after a severe case of COVID-19 infection about a month or 2 ago.  He is status post monoclonal antibody infusion.  He was having severe diarrhea and fatigue.  The diarrhea has improved.  His oral intake is better but still not back to baseline.  He still has some residual fatigue but it is improving.  Patient reports that he was down about 20 pounds overall with weight loss.  Treatment is currently being held until patient's performance status improves.  Assessment and Plan: RCC -on treatment with nivolumab.  Followed by Dr. Tasia Catchings.    Weight loss -subsequent to COVID-19 infection.  Will refer to dietitian.  Follow Up Instructions: Virtual visit in 2 to 3 months   I discussed the assessment and treatment plan with the patient. The patient was provided an opportunity to ask questions and all were answered. The patient agreed with the plan and  demonstrated an understanding of the instructions.   The patient was advised to call back or seek an in-person evaluation if the symptoms worsen or if the condition fails to improve as anticipated.  I provided 15 minutes of non-face-to-face time during this encounter.   Irean Hong, NP

## 2020-06-18 ENCOUNTER — Inpatient Hospital Stay (HOSPITAL_BASED_OUTPATIENT_CLINIC_OR_DEPARTMENT_OTHER): Payer: Medicare Other | Admitting: Oncology

## 2020-06-18 ENCOUNTER — Encounter: Payer: Self-pay | Admitting: Oncology

## 2020-06-18 ENCOUNTER — Inpatient Hospital Stay: Payer: Medicare Other

## 2020-06-18 ENCOUNTER — Inpatient Hospital Stay: Payer: BC Managed Care – PPO

## 2020-06-18 VITALS — BP 156/90 | HR 80 | Temp 96.9°F | Resp 18 | Wt 239.5 lb

## 2020-06-18 DIAGNOSIS — N1832 Chronic kidney disease, stage 3b: Secondary | ICD-10-CM

## 2020-06-18 DIAGNOSIS — C649 Malignant neoplasm of unspecified kidney, except renal pelvis: Secondary | ICD-10-CM | POA: Diagnosis not present

## 2020-06-18 DIAGNOSIS — K7469 Other cirrhosis of liver: Secondary | ICD-10-CM

## 2020-06-18 DIAGNOSIS — C642 Malignant neoplasm of left kidney, except renal pelvis: Secondary | ICD-10-CM

## 2020-06-18 DIAGNOSIS — D696 Thrombocytopenia, unspecified: Secondary | ICD-10-CM

## 2020-06-18 DIAGNOSIS — Z5112 Encounter for antineoplastic immunotherapy: Secondary | ICD-10-CM

## 2020-06-18 DIAGNOSIS — C7800 Secondary malignant neoplasm of unspecified lung: Secondary | ICD-10-CM | POA: Diagnosis not present

## 2020-06-18 DIAGNOSIS — D709 Neutropenia, unspecified: Secondary | ICD-10-CM | POA: Diagnosis not present

## 2020-06-18 DIAGNOSIS — K7460 Unspecified cirrhosis of liver: Secondary | ICD-10-CM | POA: Diagnosis not present

## 2020-06-18 LAB — CBC WITH DIFFERENTIAL/PLATELET
Abs Immature Granulocytes: 0.01 10*3/uL (ref 0.00–0.07)
Basophils Absolute: 0 10*3/uL (ref 0.0–0.1)
Basophils Relative: 1 %
Eosinophils Absolute: 0.1 10*3/uL (ref 0.0–0.5)
Eosinophils Relative: 5 %
HCT: 27.4 % — ABNORMAL LOW (ref 39.0–52.0)
Hemoglobin: 9.5 g/dL — ABNORMAL LOW (ref 13.0–17.0)
Immature Granulocytes: 0 %
Lymphocytes Relative: 14 %
Lymphs Abs: 0.3 10*3/uL — ABNORMAL LOW (ref 0.7–4.0)
MCH: 31.1 pg (ref 26.0–34.0)
MCHC: 34.7 g/dL (ref 30.0–36.0)
MCV: 89.8 fL (ref 80.0–100.0)
Monocytes Absolute: 0.3 10*3/uL (ref 0.1–1.0)
Monocytes Relative: 11 %
Neutro Abs: 1.6 10*3/uL — ABNORMAL LOW (ref 1.7–7.7)
Neutrophils Relative %: 69 %
Platelets: 60 10*3/uL — ABNORMAL LOW (ref 150–400)
RBC: 3.05 MIL/uL — ABNORMAL LOW (ref 4.22–5.81)
RDW: 14.6 % (ref 11.5–15.5)
WBC: 2.4 10*3/uL — ABNORMAL LOW (ref 4.0–10.5)
nRBC: 0 % (ref 0.0–0.2)

## 2020-06-18 LAB — COMPREHENSIVE METABOLIC PANEL
ALT: 22 U/L (ref 0–44)
AST: 34 U/L (ref 15–41)
Albumin: 3.5 g/dL (ref 3.5–5.0)
Alkaline Phosphatase: 129 U/L — ABNORMAL HIGH (ref 38–126)
Anion gap: 9 (ref 5–15)
BUN: 19 mg/dL (ref 6–20)
CO2: 21 mmol/L — ABNORMAL LOW (ref 22–32)
Calcium: 8.8 mg/dL — ABNORMAL LOW (ref 8.9–10.3)
Chloride: 106 mmol/L (ref 98–111)
Creatinine, Ser: 1.54 mg/dL — ABNORMAL HIGH (ref 0.61–1.24)
GFR, Estimated: 52 mL/min — ABNORMAL LOW (ref >60)
Glucose, Bld: 160 mg/dL — ABNORMAL HIGH (ref 70–99)
Potassium: 4.4 mmol/L (ref 3.5–5.1)
Sodium: 136 mmol/L (ref 135–145)
Total Bilirubin: 0.7 mg/dL (ref 0.3–1.2)
Total Protein: 7.6 g/dL (ref 6.5–8.1)

## 2020-06-18 LAB — TSH: TSH: 5.94 u[IU]/mL — ABNORMAL HIGH (ref 0.350–4.500)

## 2020-06-18 MED ORDER — SODIUM CHLORIDE 0.9 % IV SOLN
Freq: Once | INTRAVENOUS | Status: AC
  Filled 2020-06-18: qty 250

## 2020-06-18 MED ORDER — SODIUM CHLORIDE 0.9% FLUSH
10.0000 mL | INTRAVENOUS | Status: DC | PRN
  Administered 2020-06-18: 10 mL via INTRAVENOUS
  Filled 2020-06-18: qty 10

## 2020-06-18 MED ORDER — HEPARIN SOD (PORK) LOCK FLUSH 100 UNIT/ML IV SOLN
500.0000 [IU] | Freq: Once | INTRAVENOUS | Status: DC | PRN
  Filled 2020-06-18: qty 5

## 2020-06-18 MED ORDER — HEPARIN SOD (PORK) LOCK FLUSH 100 UNIT/ML IV SOLN
500.0000 [IU] | Freq: Once | INTRAVENOUS | Status: AC
  Administered 2020-06-18: 500 [IU] via INTRAVENOUS
  Filled 2020-06-18: qty 5

## 2020-06-18 MED ORDER — SODIUM CHLORIDE 0.9 % IV SOLN
240.0000 mg | Freq: Once | INTRAVENOUS | Status: AC
  Administered 2020-06-18: 240 mg via INTRAVENOUS
  Filled 2020-06-18: qty 24

## 2020-06-18 NOTE — Progress Notes (Signed)
Pt tolerated treatment well with no s/s of distress or reaction noted. Pt stable at discharge.

## 2020-06-18 NOTE — Progress Notes (Addendum)
Hematology/Oncology follow-up [facility] Telephone:([phone] [phone]   Patient Care Team: Albina Billet, MD as PCP - General (Internal Medicine) Earlie Server, MD as Consulting Physician (Hematology and Oncology)  REFERRING PROVIDER: Albina Billet, MD  CHIEF COMPLAINTS/REASON FOR VISIT:  Follow-up for kidney cancer treatments  HISTORY OF PRESENTING ILLNESS:   Henry Moore is a  59 y.o.  male with PMH listed below was seen in consultation at the request of  Albina Billet, MD  for evaluation of kidney cancer Extensive medical records were reviewed Patient had MRI lumbar spine without contrast done on 07/13/2018 which showed mired lumbar degenerative disease. CT thoracic spine was done on 08/05/2018 which showed 5 cm left renal mass. 09/04/2018 CT abdomen and pelvis with and without contrast showed 5 cm round enhancing solid mass in the left kidney.  No involvement of left renal vein.  No lymphadenopathy Morphologic changes consistent with cirrhosis and the portal hypertension.  Small volume ascites and splenomegaly. . 10/16/2018 Left nephrectomy showed renal cell carcinoma, clear-cell type, nuclear grade 4, tumor extends into the renal vein and renal sinus fat.  Urethral, vascular and or resection margins are negative for tumor pT3a Nx Mx  01/28/2019 patient had another CT chest abdomen pelvis done with contrast Showed interval left nephrectomy.  Scattered tiny pulmonary nodules bilaterally.  Nonspecific.  No other evidence of metastatic disease.  Cirrhosis changes extensive coronary and aortic atherosclerosis  07/14/2019 patient underwent surveillance CT chest abdomen pelvis without contrast Interval progression of pulmonary metastasis with new enlarged right paratracheal lymph node 1.7 cm, previously 0.6 cm one-point since worrisome for metastatic adenopathy.  Multiple progressive pulmonary nodules, index nodule within the lingula measures 1.3 cm, previously  3 mm. Index subpleural nodule within the anterior right upper lobe measuring 1.8 cm, previously 3 cm Index nodule within the post lateral right lower lobe measuring 5 mm, new from previous care Aortic sclerosis.  Three-vessel coronary artery calcification noted.  Cirrhosis changes.  Splenomegaly.  Patient was referred to establish care with medical oncology for further discussion and evaluation of metastatic renal cell carcinoma. Patient reports feeling well at baseline today.  Denies any shortness of breath, cough, hemoptysis, back pain, headache. He quitted smoking 2015.  Not currently actively drinking alcohol as well. Patient has chronic back pain unchanged.  # Liver cirrhosis with small amount of ascites/splenomegaly patient sees gastroenterology Dr. [name].  . He will get ultrasound surveillance due in February 2021 for screening of [place].  EGD surveillance for Barrett's plan in April 2021.  # Diabetes, on Humalog sliding scale and metformin.  #Family history of cancer and personal history of RCC.  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  Status post ipilimumab and nivolumab treatment.  # 10/29/2019 CT chest abdomen pelvis without contrast images were independently reviewed by me and discussed with patient.   CT showed marked improvement with resolution of many of the pulmonary nodules, prominent reduced size of other nodules.  Considerably reduced size of the right lower paratracheal lymph node now 0.9 cm in diameter. Hepatic cirrhosis with prominent splenomegaly and trace perisplenic ascites. Chronic CT findings includes aortic atherosclerotic vascular disease.  Mild sigmoid colon diverticulosis  # CKD, he establish care with Dr. Candiss Norse for chronic  kidney disease.  Blood pressure medication has been adjusted.  Hydrochlorothiazide decreased to 12.5 mg daily.  # NGS -foundation one PD-L1 TPS 0% no reportable mutation, MS stable. TMB 8 muts/mb VHL D125fs*16, SETD2 BAP Genetic testing is negative.   INTERVAL HISTORY Henry Moore is a 59 y.o. male who has above history reviewed by me today presents for follow up visit for management of stage IV RCC  Problems and complaints are listed below: Patient has had COVID-19 infection Immunotherapy was temporarily held due to COVID-19 infection.  Today he  feels well at baseline.  Has recovered from recent infection.  No new complaints. Appetite is fair.  He has gained weight.  Review of Systems  Constitutional: Positive for fatigue. Negative for appetite change, chills, fever and unexpected weight change.  HENT:   Negative for hearing loss and voice change.   Eyes: Negative for eye problems and icterus.  Respiratory: Negative for chest tightness, cough and shortness of breath.   Cardiovascular: Negative for chest pain and leg swelling.  Gastrointestinal: Negative for abdominal distention and abdominal pain.  Endocrine: Negative for hot flashes.  Genitourinary: Negative for difficulty urinating, dysuria and frequency.   Musculoskeletal: Negative for arthralgias.       Chronic back pain  Skin: Negative for itching and rash.  Neurological: Negative for light-headedness and numbness.  Hematological: Negative for adenopathy. Does not bruise/bleed easily.  Psychiatric/Behavioral: Negative for confusion.    MEDICAL HISTORY:  Past Medical History:  Diagnosis Date  . Cough    SINUS DRAINAGE CAUSING COUGHING , REPORTS CLEAR MUCOUS   . Diabetes mellitus without complication ([place])   . Family history of breast cancer   . Family history of colon cancer   . Family history of stomach cancer   . HTN (hypertension)   . Hyperlipemia   . Left renal mass   . Platelets decreased ([place])    DENIES UNSUAL  BLEEDING   . PONV (postoperative nausea and vomiting)   . Renal cell carcinoma ([place]) 08/04/2019  . WBC decreased     SURGICAL HISTORY: Past Surgical History:  Procedure Laterality Date  . ANKLE SURGERY Left   . ANTERIOR CERVICAL DECOMP/DISCECTOMY FUSION N/A 04/16/2014   Procedure: ANTERIOR CERVICAL DECOMPRESSION/DISCECTOMY FUSION  (ACDF C5-C7)   (2 LEVELS)     ;  Surgeon: Melina Schools, MD;  Location: [place];  Service: Orthopedics;  Laterality: N/A;  . APPENDECTOMY    . BACK SURGERY    . ESOPHAGOGASTRODUODENOSCOPY (EGD) WITH PROPOFOL N/A 02/06/2019   Procedure: ESOPHAGOGASTRODUODENOSCOPY (EGD) WITH PROPOFOL;  Surgeon: [name], MD;  Location: [facility] ENDOSCOPY;  Service: Gastroenterology;  Laterality: N/A;  . ESOPHAGOGASTRODUODENOSCOPY (EGD) WITH PROPOFOL N/A 03/04/2019   Procedure: ESOPHAGOGASTRODUODENOSCOPY (EGD) WITH PROPOFOL;  Surgeon: Lin Landsman, MD;  Location: [facility] ENDOSCOPY;  Service: Gastroenterology;  Laterality: N/A;  . ESOPHAGOGASTRODUODENOSCOPY (EGD) WITH PROPOFOL N/A 04/22/2019   Procedure: ESOPHAGOGASTRODUODENOSCOPY (EGD) WITH PROPOFOL;  Surgeon: [name], MD;  Location[facility] ENDOSCOPY;  Service: Gastroenterology;  Laterality: N/A;  . FRACTURE SURGERY    . HYDROCELE EXCISION    . KNEE ARTHROSCOPY Bilateral   . LAPAROSCOPIC NEPHRECTOMY, HAND ASSISTED Left 10/16/2018   Procedure: LEFT HAND ASSISTED LAPAROSCOPIC NEPHRECTOMY;  Surgeon: Lucas Mallow, MD;  Location: [facility] ORS;  Service: Urology;  Laterality: Left;  . NASAL SINUS SURGERY     X 2  . 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 PORTA CATH INSERTION N/A 11/11/2019   Procedure: PORTA CATH INSERTION;  Surgeon: Katha Cabal, MD;  Location: [place] CV LAB;  Service: Cardiovascular;  Laterality: N/A;  . SHOULDER SURGERY Left     SOCIAL HISTORY: Social History   Socioeconomic History  . Marital status: Married    Spouse name: Not on file  . Number of children: Not on file  . Years of education: Not  on file  . Highest education level: Not on file  Occupational History  . Not on file  Tobacco Use  . Smoking status: Former Smoker    Packs/day: 0.25    Years: 20.00    Pack years: 5.00    Quit date: 2015    Years since quitting: 6.8  . Smokeless tobacco: Never Used  Vaping Use  . Vaping Use: Never used  Substance and Sexual Activity  . Alcohol use: Not Currently    Alcohol/week: 0.0 standard drinks    Comment: no alchol in 1 year  . Drug use: No  . Sexual activity: Not on file  Other Topics Concern  . Not on file  Social History Narrative  . Not on file   Social Determinants of Health   Financial Resource Strain:   . Difficulty of Paying Living Expenses: Not on file  Food Insecurity:   . Worried About Charity fundraiser in the Last Year: Not on file  . Ran Out of Food in the Last Year: Not on file  Transportation Needs:   . Lack of Transportation (Medical): Not on file  . Lack of Transportation (Non-Medical): Not on file  Physical Activity:   . Days of Exercise per Week: Not on file  . Minutes of Exercise per Session: Not on file  Stress:   . Feeling of Stress : Not on file  Social Connections:   . Frequency of Communication with Friends and Family: Not on file  . Frequency of Social Gatherings with Friends and Family: Not on file  . Attends Religious Services: Not on file  . Active Member of Clubs or Organizations: Not on file  . Attends Archivist Meetings: Not on file  . Marital Status: Not on file  Intimate Partner Violence:   . Fear of Current or Ex-Partner: Not on file  . Emotionally Abused: Not on file  . Physically Abused: Not on file  . Sexually Abused: Not on file    FAMILY HISTORY: Family History  Problem Relation Age of Onset  . Diabetes Mother   . Cancer Mother        neuroendocrine cancer  . Cancer Father        neuroendocrine cancer of meninges  . Cancer Maternal Grandmother        tomach dx 81  . Alzheimer's disease Paternal  Grandmother   . Lung cancer Paternal Grandfather   . Lung cancer Maternal Aunt   . Colon cancer Maternal Uncle   . Breast cancer Paternal Aunt        both dx 46s  . Ovarian cancer Other        dx 76  . Breast cancer Cousin     ALLERGIES:  is allergic to codeine and mucinex [guaifenesin er].  MEDICATIONS:  Current Outpatient Medications  Medication Sig Dispense Refill  . ACCU-CHEK AVIVA PLUS test strip CHECK BL00D SUGAR ONCE OR TWICE DAILY. 100 each PRN  . carvedilol (COREG) 25 MG tablet Take 1 tablet (25 mg total) by mouth 2 (two) times daily with a meal. 180 tablet 3  . Cholecalciferol (VITAMIN D3) 125 MCG (5000 UT) CAPS Take  5,000 Units by mouth daily.    Marland Kitchen glipiZIDE (GLUCOTROL) 5 MG tablet Take 1 tablet (5 mg total) by mouth daily before breakfast. 90 tablet 3  . losartan (COZAAR) 100 MG tablet Take 1 tablet by mouth daily. (Patient taking differently: Take 100 mg by mouth daily. ) 90 tablet 0  . lovastatin (MEVACOR) 40 MG tablet Take 1 tablet (40 mg total) by mouth at bedtime. 60 tablet 0  . metFORMIN (GLUCOPHAGE-XR) 500 MG 24 hr tablet Take 500 mg by mouth 2 (two) times daily.     . vitamin B-12 (CYANOCOBALAMIN) 1000 MCG tablet Take 1 tablet (1,000 mcg total) by mouth daily. 90 tablet 0  . amLODipine (NORVASC) 10 MG tablet Take 10 mg by mouth daily. (Patient not taking: Reported on 04/29/2020)    . hydrochlorothiazide (HYDRODIURIL) 25 MG tablet TAKE 1 TABLET DAILY. (Patient not taking: Reported on 04/29/2020) 90 tablet 0  . insulin lispro (INSULIN LISPRO) 100 UNIT/ML KwikPen Junior Inject into the skin 3 (three) times daily as needed (Above 250). Sliding scale (Patient not taking: Reported on 02/05/2020)    . prochlorperazine (COMPAZINE) 10 MG tablet Take 1 tablet (10 mg total) by mouth every 6 (six) hours as needed for nausea or vomiting. (Patient not taking: Reported on 04/15/2020) 30 tablet 0   No current facility-administered medications for this visit.   Facility-Administered  Medications Ordered in Other Visits  Medication Dose Route Frequency Provider Last Rate Last Admin  . heparin lock flush 100 unit/mL  500 Units Intracatheter Once PRN Earlie Server, MD      . sodium chloride flush (NS) 0.9 % injection 10 mL  10 mL Intravenous PRN Earlie Server, MD   10 mL at 01/22/20 0831  . sodium chloride flush (NS) 0.9 % injection 10 mL  10 mL Intravenous PRN Earlie Server, MD   10 mL at 06/18/20 0849     PHYSICAL EXAMINATION: ECOG PERFORMANCE STATUS: 1 - Symptomatic but completely ambulatory Vitals:   06/18/20 0909  BP: (!) 156/90  Pulse: 80  Resp: 18  Temp: (!) 96.9 F (36.1 C)   Filed Weights   06/18/20 0909  Weight: 239 lb 8 oz (108.6 kg)    Physical Exam Constitutional:      General: He is not in acute distress.    Appearance: He is obese.  HENT:     Head: Normocephalic and atraumatic.  Eyes:     General: No scleral icterus. Cardiovascular:     Rate and Rhythm: Normal rate and regular rhythm.     Heart sounds: Normal heart sounds.  Pulmonary:     Effort: Pulmonary effort is normal. No respiratory distress.     Breath sounds: No wheezing.  Abdominal:     General: Bowel sounds are normal. There is no distension.     Palpations: Abdomen is soft.  Musculoskeletal:        General: No deformity. Normal range of motion.     Cervical back: Normal range of motion and neck supple.  Skin:    General: Skin is warm and dry.     Findings: No erythema or rash.  Neurological:     Mental Status: He is alert and oriented to person, place, and time. Mental status is at baseline.     Cranial Nerves: No cranial nerve deficit.     Coordination: Coordination normal.  Psychiatric:        Mood and Affect: Mood normal.     LABORATORY DATA:  I have reviewed  the data as listed Lab Results  Component Value Date   WBC 2.4 (L) 06/18/2020   HGB 9.5 (L) 06/18/2020   HCT 27.4 (L) 06/18/2020   MCV 89.8 06/18/2020   PLT 60 (L) 06/18/2020   Recent Labs    04/01/20 0837  04/01/20 0837 04/15/20 0853 04/15/20 0853 04/29/20 0849 05/21/20 1253 06/18/20 0849  NA 139   < > 135   < > 138 135 136  K 4.3   < > 4.3   < > 4.2 4.9 4.4  CL 107   < > 104   < > 108 108 106  CO2 21*   < > 21*   < > 23 21* 21*  GLUCOSE 123*   < > 162*   < > 109* 156* 160*  BUN 24*   < > 22*   < > 20 32* 19  CREATININE 1.69*   < > 1.87*   < > 1.58* 1.75* 1.54*  CALCIUM 8.5*   < > 8.6*   < > 8.7* 8.7* 8.8*  GFRNONAA 44*   < > 39*   < > 48* 45* 52*  GFRAA 51*  --  45*  --  55*  --   --   PROT 7.9   < > 7.8   < > 8.0 7.9 7.6  ALBUMIN 4.0   < > 4.0   < > 4.3 3.1* 3.5  AST 31   < > 34   < > 29 55* 34  ALT 24   < > 27   < > 21 47* 22  ALKPHOS 102   < > 117   < > 108 126 129*  BILITOT 0.9   < > 0.8   < > 1.5* 1.1 0.7   < > = values in this interval not displayed.   Iron/TIBC/Ferritin/ %Sat    Component Value Date/Time   IRON 52 12/18/2019 0826   IRON 105 10/15/2018 1325   TIBC 284 12/18/2019 0826   TIBC 244 (L) 10/15/2018 1325   FERRITIN 146 12/18/2019 0826   FERRITIN 588 (H) 10/15/2018 1325   IRONPCTSAT 18 12/18/2019 0826   IRONPCTSAT 43 10/15/2018 1325      RADIOGRAPHIC STUDIES: I have personally reviewed the radiological images as listed and agreed with the findings in the report. No results found.   ASSESSMENT & PLAN:  1. Renal cell carcinoma, unspecified laterality ([place])   2. Encounter for antineoplastic immunotherapy   3. Thrombocytopenia ([place])   4. Chronic kidney disease, stage 3b ([place])   5. 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 Counts are stable to proceed with nivolumab. Obtain CT chest abdomen pelvis   #Chronic thrombocytopenia and neutropenia, secondary to cirrhosis.  Counts are stable.  #Chronic kidney disease, stable creatinine.  Avoid nephrotoxins.  Encourage oral hydration.  #Chronic liver cirrhosis  with thrombocytopenia and neutropenia.   #I discussed with patient about recommendation of flu vaccination  Follow-up 2 week lab MD for assessment evaluation prior to nivolumab. All questions were answered. The patient knows to call the clinic with any problems questions or concerns.   Earlie Server, MD, PhD Hematology Oncology [facility] at [employer] Pager- [phone] 06/18/2020

## 2020-06-18 NOTE — Progress Notes (Signed)
Patient denies new problems/concerns today.   °

## 2020-06-28 ENCOUNTER — Inpatient Hospital Stay: Payer: BC Managed Care – PPO

## 2020-06-28 NOTE — Progress Notes (Signed)
Nutrition Assessment   Reason for Assessment:  Referral from [place] due to 20 lb weight loss from COVID infection   ASSESSMENT:  59 year old male with stage IV metastatic renal cell carcinoma.  Past medical history of cirrhosis, DM, left nephrectomy (09/2018), HTN, HLD.  Patient receiving immunotherapy.    Met with patient in clinic.  Patient reports that appetite has returned after having COVID infection. Reports that for about 2 weeks during COVID unable to keep solids down due to vomiting and diarrhea.  Reports that all of these symptoms have resolved. Reports taste is still not back but eating despite lack of taste of foods.  Reports that breakfast is usually cheerios, sometimes eats lunch (1/2 peanut butter sandwich). Supper is meat and couple side items. Last night was tuna casserole with green beans.  Does not drink oral nutritions supplements. Snacks some on nuts.     Medications: vit d, glipzide, lispro, vit b 12   Labs: reviewed   Anthropometrics:   Height: 73 inches Weight: 246 lb 1 oz today 9/2 254 lb 234 lb 10/22  BMI: 31  Weight increasing at this time   NUTRITION DIAGNOSIS: none at this time   INTERVENTION:  Encouraged well-balanced diet including good sources of protein.  Patient mindful about carbohydrates and managing blood glucose.  Contact information provided to patient and encouraged to reach out if changes in nutritional status occur.    Next Visit: no follow-up as patient intake and weight improving   Mikeyla Music B. Zenia Resides, [place], [place] Registered Dietitian [phone] (mobile)

## 2020-06-30 ENCOUNTER — Ambulatory Visit: Payer: BC Managed Care – PPO

## 2020-07-08 ENCOUNTER — Other Ambulatory Visit: Payer: Self-pay

## 2020-07-08 ENCOUNTER — Inpatient Hospital Stay (HOSPITAL_BASED_OUTPATIENT_CLINIC_OR_DEPARTMENT_OTHER): Payer: Medicare Other | Admitting: Oncology

## 2020-07-08 ENCOUNTER — Inpatient Hospital Stay: Payer: BC Managed Care – PPO | Attending: Oncology

## 2020-07-08 ENCOUNTER — Encounter: Payer: Self-pay | Admitting: Oncology

## 2020-07-08 ENCOUNTER — Inpatient Hospital Stay: Payer: BC Managed Care – PPO

## 2020-07-08 VITALS — BP 110/73 | HR 73 | Resp 16

## 2020-07-08 VITALS — BP 119/75 | HR 74 | Temp 96.8°F | Resp 16 | Wt 242.9 lb

## 2020-07-08 DIAGNOSIS — C7800 Secondary malignant neoplasm of unspecified lung: Secondary | ICD-10-CM | POA: Diagnosis not present

## 2020-07-08 DIAGNOSIS — Z801 Family history of malignant neoplasm of trachea, bronchus and lung: Secondary | ICD-10-CM | POA: Diagnosis not present

## 2020-07-08 DIAGNOSIS — Z79899 Other long term (current) drug therapy: Secondary | ICD-10-CM | POA: Insufficient documentation

## 2020-07-08 DIAGNOSIS — Z794 Long term (current) use of insulin: Secondary | ICD-10-CM | POA: Diagnosis not present

## 2020-07-08 DIAGNOSIS — N1832 Chronic kidney disease, stage 3b: Secondary | ICD-10-CM | POA: Diagnosis not present

## 2020-07-08 DIAGNOSIS — I2510 Atherosclerotic heart disease of native coronary artery without angina pectoris: Secondary | ICD-10-CM | POA: Diagnosis not present

## 2020-07-08 DIAGNOSIS — G8929 Other chronic pain: Secondary | ICD-10-CM | POA: Insufficient documentation

## 2020-07-08 DIAGNOSIS — C649 Malignant neoplasm of unspecified kidney, except renal pelvis: Secondary | ICD-10-CM

## 2020-07-08 DIAGNOSIS — R188 Other ascites: Secondary | ICD-10-CM | POA: Insufficient documentation

## 2020-07-08 DIAGNOSIS — M549 Dorsalgia, unspecified: Secondary | ICD-10-CM | POA: Insufficient documentation

## 2020-07-08 DIAGNOSIS — R161 Splenomegaly, not elsewhere classified: Secondary | ICD-10-CM | POA: Insufficient documentation

## 2020-07-08 DIAGNOSIS — I129 Hypertensive chronic kidney disease with stage 1 through stage 4 chronic kidney disease, or unspecified chronic kidney disease: Secondary | ICD-10-CM | POA: Insufficient documentation

## 2020-07-08 DIAGNOSIS — E1122 Type 2 diabetes mellitus with diabetic chronic kidney disease: Secondary | ICD-10-CM | POA: Diagnosis not present

## 2020-07-08 DIAGNOSIS — C642 Malignant neoplasm of left kidney, except renal pelvis: Secondary | ICD-10-CM

## 2020-07-08 DIAGNOSIS — Z87891 Personal history of nicotine dependence: Secondary | ICD-10-CM | POA: Insufficient documentation

## 2020-07-08 DIAGNOSIS — Z5112 Encounter for antineoplastic immunotherapy: Secondary | ICD-10-CM | POA: Insufficient documentation

## 2020-07-08 DIAGNOSIS — K7460 Unspecified cirrhosis of liver: Secondary | ICD-10-CM | POA: Diagnosis not present

## 2020-07-08 DIAGNOSIS — D696 Thrombocytopenia, unspecified: Secondary | ICD-10-CM | POA: Insufficient documentation

## 2020-07-08 LAB — CBC WITH DIFFERENTIAL/PLATELET
Abs Immature Granulocytes: 0.02 10*3/uL (ref 0.00–0.07)
Basophils Absolute: 0 10*3/uL (ref 0.0–0.1)
Basophils Relative: 1 %
Eosinophils Absolute: 0.1 10*3/uL (ref 0.0–0.5)
Eosinophils Relative: 4 %
HCT: 27.7 % — ABNORMAL LOW (ref 39.0–52.0)
Hemoglobin: 9.6 g/dL — ABNORMAL LOW (ref 13.0–17.0)
Immature Granulocytes: 1 %
Lymphocytes Relative: 14 %
Lymphs Abs: 0.4 10*3/uL — ABNORMAL LOW (ref 0.7–4.0)
MCH: 31.1 pg (ref 26.0–34.0)
MCHC: 34.7 g/dL (ref 30.0–36.0)
MCV: 89.6 fL (ref 80.0–100.0)
Monocytes Absolute: 0.3 10*3/uL (ref 0.1–1.0)
Monocytes Relative: 11 %
Neutro Abs: 1.8 10*3/uL (ref 1.7–7.7)
Neutrophils Relative %: 69 %
Platelets: 84 10*3/uL — ABNORMAL LOW (ref 150–400)
RBC: 3.09 MIL/uL — ABNORMAL LOW (ref 4.22–5.81)
RDW: 14.5 % (ref 11.5–15.5)
WBC: 2.6 10*3/uL — ABNORMAL LOW (ref 4.0–10.5)
nRBC: 0 % (ref 0.0–0.2)

## 2020-07-08 LAB — COMPREHENSIVE METABOLIC PANEL
ALT: 30 U/L (ref 0–44)
AST: 35 U/L (ref 15–41)
Albumin: 3.8 g/dL (ref 3.5–5.0)
Alkaline Phosphatase: 125 U/L (ref 38–126)
Anion gap: 9 (ref 5–15)
BUN: 26 mg/dL — ABNORMAL HIGH (ref 6–20)
CO2: 21 mmol/L — ABNORMAL LOW (ref 22–32)
Calcium: 8.9 mg/dL (ref 8.9–10.3)
Chloride: 103 mmol/L (ref 98–111)
Creatinine, Ser: 1.6 mg/dL — ABNORMAL HIGH (ref 0.61–1.24)
GFR, Estimated: 50 mL/min — ABNORMAL LOW (ref >60)
Glucose, Bld: 179 mg/dL — ABNORMAL HIGH (ref 70–99)
Potassium: 4.4 mmol/L (ref 3.5–5.1)
Sodium: 133 mmol/L — ABNORMAL LOW (ref 135–145)
Total Bilirubin: 0.9 mg/dL (ref 0.3–1.2)
Total Protein: 7.5 g/dL (ref 6.5–8.1)

## 2020-07-08 MED ORDER — SODIUM CHLORIDE 0.9 % IV SOLN
240.0000 mg | Freq: Once | INTRAVENOUS | Status: AC
  Administered 2020-07-08: 240 mg via INTRAVENOUS
  Filled 2020-07-08: qty 24

## 2020-07-08 MED ORDER — HEPARIN SOD (PORK) LOCK FLUSH 100 UNIT/ML IV SOLN
INTRAVENOUS | Status: AC
  Filled 2020-07-08: qty 5

## 2020-07-08 MED ORDER — SODIUM CHLORIDE 0.9 % IV SOLN
Freq: Once | INTRAVENOUS | Status: AC
  Filled 2020-07-08: qty 250

## 2020-07-08 MED ORDER — HEPARIN SOD (PORK) LOCK FLUSH 100 UNIT/ML IV SOLN
500.0000 [IU] | Freq: Once | INTRAVENOUS | Status: AC
  Administered 2020-07-08: 500 [IU] via INTRAVENOUS
  Filled 2020-07-08: qty 5

## 2020-07-08 NOTE — Progress Notes (Signed)
Hematology/Oncology follow-up [facility] Telephone:([phone] [phone]   Patient Care Team: Albina Billet, MD as PCP - General (Internal Medicine) Earlie Server, MD as Consulting Physician (Hematology and Oncology)  REFERRING PROVIDER: Albina Billet, MD  CHIEF COMPLAINTS/REASON FOR VISIT:  Follow-up for kidney cancer treatments  HISTORY OF PRESENTING ILLNESS:   Henry Moore is a  59 y.o.  male with PMH listed below was seen in consultation at the request of  Albina Billet, MD  for evaluation of kidney cancer Extensive medical records were reviewed Patient had MRI lumbar spine without contrast done on 07/13/2018 which showed mired lumbar degenerative disease. CT thoracic spine was done on 08/05/2018 which showed 5 cm left renal mass. 09/04/2018 CT abdomen and pelvis with and without contrast showed 5 cm round enhancing solid mass in the left kidney.  No involvement of left renal vein.  No lymphadenopathy Morphologic changes consistent with cirrhosis and the portal hypertension.  Small volume ascites and splenomegaly. . 10/16/2018 Left nephrectomy showed renal cell carcinoma, clear-cell type, nuclear grade 4, tumor extends into the renal vein and renal sinus fat.  Urethral, vascular and or resection margins are negative for tumor pT3a Nx Mx  01/28/2019 patient had another CT chest abdomen pelvis done with contrast Showed interval left nephrectomy.  Scattered tiny pulmonary nodules bilaterally.  Nonspecific.  No other evidence of metastatic disease.  Cirrhosis changes extensive coronary and aortic atherosclerosis  07/14/2019 patient underwent surveillance CT chest abdomen pelvis without contrast Interval progression of pulmonary metastasis with new enlarged right paratracheal lymph node 1.7 cm, previously 0.6 cm one-point since worrisome for metastatic adenopathy.  Multiple progressive pulmonary nodules, index nodule within the lingula measures 1.3 cm, previously  3 mm. Index subpleural nodule within the anterior right upper lobe measuring 1.8 cm, previously 3 cm Index nodule within the post lateral right lower lobe measuring 5 mm, new from previous care Aortic sclerosis.  Three-vessel coronary artery calcification noted.  Cirrhosis changes.  Splenomegaly.  Patient was referred to establish care with medical oncology for further discussion and evaluation of metastatic renal cell carcinoma. Patient reports feeling well at baseline today.  Denies any shortness of breath, cough, hemoptysis, back pain, headache. He quitted smoking 2015.  Not currently actively drinking alcohol as well. Patient has chronic back pain unchanged.  # Liver cirrhosis with small amount of ascites/splenomegaly patient sees gastroenterology Dr. [name].  . He will get ultrasound surveillance due in February 2021 for screening of [place].  EGD surveillance for Barrett's plan in April 2021.  # Diabetes, on Humalog sliding scale and metformin.  #Family history of cancer and personal history of RCC.  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  Status post ipilimumab and nivolumab treatment.  # 10/29/2019 CT chest abdomen pelvis without contrast images were independently reviewed by me and discussed with patient.   CT showed marked improvement with resolution of many of the pulmonary nodules, prominent reduced size of other nodules.  Considerably reduced size of the right lower paratracheal lymph node now 0.9 cm in diameter. Hepatic cirrhosis with prominent splenomegaly and trace perisplenic ascites. Chronic CT findings includes aortic atherosclerotic vascular disease.  Mild sigmoid colon diverticulosis  # CKD, he establish care with Dr. Candiss Norse for chronic  kidney disease.  Blood pressure medication has been adjusted.  Hydrochlorothiazide decreased to 12.5 mg daily.  # NGS -foundation one PD-L1 TPS 0% no reportable mutation, MS stable. TMB 8 muts/mb VHL [id number]*16, SETD2 BAP Genetic testing is negative.   INTERVAL HISTORY Henry Moore a 59y.o. male who has above history reviewed by me today presents for follow up visit for management of stage IV RCC  Problems and complaints are listed below: He reports being sick for a few days during the interval, wants to reschedule his CT.  He feels better today. No new complaints.   Review of Systems  Constitutional: Positive for fatigue. Negative for appetite change, chills, fever and unexpected weight change.  HENT:   Negative for hearing loss and voice change.   Eyes: Negative for eye problems and icterus.  Respiratory: Negative for chest tightness, cough and shortness of breath.   Cardiovascular: Negative for chest pain and leg swelling.  Gastrointestinal: Negative for abdominal distention and abdominal pain.  Endocrine: Negative for hot flashes.  Genitourinary: Negative for difficulty urinating, dysuria and frequency.   Musculoskeletal: Negative for arthralgias.       Chronic back pain  Skin: Negative for itching and rash.  Neurological: Negative for light-headedness and numbness.  Hematological: Negative for adenopathy. Does not bruise/bleed easily.  Psychiatric/Behavioral: Negative for confusion.    MEDICAL HISTORY:  Past Medical History:  Diagnosis Date  . Cough    SINUS DRAINAGE CAUSING COUGHING , REPORTS CLEAR MUCOUS   . Diabetes mellitus without complication ([place]   . Family history of breast cancer   . Family history of colon cancer   . Family history of stomach cancer   . HTN (hypertension)   . Hyperlipemia   . Left renal mass   . Platelets decreased ([place]    DENIES UNSUAL BLEEDING   . PONV (postoperative nausea and vomiting)   . Renal cell carcinoma ([place] 08/04/2019  .  WBC decreased     SURGICAL HISTORY: Past Surgical History:  Procedure Laterality Date  . ANKLE SURGERY Left   . ANTERIOR CERVICAL DECOMP/DISCECTOMY FUSION N/A 04/16/2014   Procedure: ANTERIOR CERVICAL DECOMPRESSION/DISCECTOMY FUSION  (ACDF C5-C7)   (2 LEVELS)     ;  Surgeon: DMelina Schools MD;  [place] [facility]  Service: Orthopedics;  Laterality: N/A;  . APPENDECTOMY    . BACK SURGERY    . ESOPHAGOGASTRODUODENOSCOPY (EGD) WITH PROPOFOL N/A 02/06/2019   Procedure: ESOPHAGOGASTRODUODENOSCOPY (EGD) WITH PROPOFOL;  Surgeon: AJonathon Bellows MD;  [facility];  Service: Gastroenterology;  Laterality: N/A;  . ESOPHAGOGASTRODUODENOSCOPY (EGD) WITH PROPOFOL N/A 03/04/2019   Procedure: ESOPHAGOGASTRODUODENOSCOPY (EGD) WITH PROPOFOL;  Surgeon: VLin Landsman MD;  [facility];  Service: Gastroenterology;  Laterality: N/A;  . ESOPHAGOGASTRODUODENOSCOPY (EGD) WITH PROPOFOL N/A 04/22/2019   Procedure: ESOPHAGOGASTRODUODENOSCOPY (EGD) WITH PROPOFOL;  Surgeon: AJonathon Bellows MD;  [facility];  Service: Gastroenterology;  Laterality: N/A;  . FRACTURE SURGERY    . HYDROCELE EXCISION    . KNEE ARTHROSCOPY Bilateral   . LAPAROSCOPIC NEPHRECTOMY, HAND ASSISTED Left 10/16/2018   Procedure: LEFT HAND ASSISTED LAPAROSCOPIC NEPHRECTOMY;  Surgeon: BLucas Mallow MD;  Location[facility];  Service: Urology;  Laterality: Left;  . NASAL SINUS SURGERY     X 2  . 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 PORTA CATH INSERTION N/A 11/11/2019   Procedure: PORTA CATH INSERTION;  Surgeon: SKatha Cabal MD;  [facility];  Service: Cardiovascular;  Laterality: N/A;  .  SHOULDER SURGERY Left     SOCIAL HISTORY: Social History   Socioeconomic History  . Marital status: Married    Spouse name: Not on file  . Number of children: Not on file  . Years of education: Not on file  . Highest education level: Not on file  Occupational History  . Not on file  Tobacco  Use  . Smoking status: Former Smoker    Packs/day: 0.25    Years: 20.00    Pack years: 5.00    Quit date: 2015    Years since quitting: 6.9  . Smokeless tobacco: Never Used  Vaping Use  . Vaping Use: Never used  Substance and Sexual Activity  . Alcohol use: Not Currently    Alcohol/week: 0.0 standard drinks    Comment: no alchol in 1 year  . Drug use: No  . Sexual activity: Not on file  Other Topics Concern  . Not on file  Social History Narrative  . Not on file   Social Determinants of Health   Financial Resource Strain: Not on file  Food Insecurity: Not on file  Transportation Needs: Not on file  Physical Activity: Not on file  Stress: Not on file  Social Connections: Not on file  Intimate Partner Violence: Not on file    FAMILY HISTORY: Family History  Problem Relation Age of Onset  . Diabetes Mother   . Cancer Mother        neuroendocrine cancer  . Cancer Father        neuroendocrine cancer of meninges  . Cancer Maternal Grandmother        tomach dx 29  . Alzheimer's disease Paternal Grandmother   . Lung cancer Paternal Grandfather   . Lung cancer Maternal Aunt   . Colon cancer Maternal Uncle   . Breast cancer Paternal Aunt        both dx 53s  . Ovarian cancer Other        dx 89  . Breast cancer Cousin     ALLERGIES:  is allergic to codeine and mucinex [guaifenesin er].  MEDICATIONS:  Current Outpatient Medications  Medication Sig Dispense Refill  . ACCU-CHEK AVIVA PLUS test strip CHECK BL00D SUGAR ONCE OR TWICE DAILY. 100 each PRN  . carvedilol (COREG) 25 MG tablet Take 1 tablet (25 mg total) by mouth 2 (two) times daily with a meal. 180 tablet 3  . Cholecalciferol (VITAMIN D3) 125 MCG (5000 UT) CAPS Take 5,000 Units by mouth daily.    Marland Kitchen glipiZIDE (GLUCOTROL) 5 MG tablet Take 1 tablet (5 mg total) by mouth daily before breakfast. 90 tablet 3  . losartan (COZAAR) 100 MG tablet Take 1 tablet by mouth daily. 90 tablet 0  . lovastatin (MEVACOR) 40 MG  tablet Take 1 tablet (40 mg total) by mouth at bedtime. 60 tablet 0  . metFORMIN (GLUCOPHAGE-XR) 500 MG 24 hr tablet Take 500 mg by mouth 2 (two) times daily.     . vitamin B-12 (CYANOCOBALAMIN) 1000 MCG tablet Take 1 tablet (1,000 mcg total) by mouth daily. 90 tablet 0  . amLODipine (NORVASC) 10 MG tablet Take 10 mg by mouth daily. (Patient not taking: No sig reported)    . hydrochlorothiazide (HYDRODIURIL) 25 MG tablet TAKE 1 TABLET DAILY. (Patient not taking: No sig reported) 90 tablet 0  . insulin lispro (INSULIN LISPRO) 100 UNIT/ML KwikPen Junior Inject into the skin 3 (three) times daily as needed (Above 250). Sliding scale (Patient not taking: No sig reported)    .  prochlorperazine (COMPAZINE) 10 MG tablet Take 1 tablet (10 mg total) by mouth every 6 (six) hours as needed for nausea or vomiting. (Patient not taking: No sig reported) 30 tablet 0   No current facility-administered medications for this visit.   Facility-Administered Medications Ordered in Other Visits  Medication Dose Route Frequency Provider Last Rate Last Admin  . sodium chloride flush (NS) 0.9 % injection 10 mL  10 mL Intravenous PRN Earlie Server, MD   10 mL at 01/22/20 0831     PHYSICAL EXAMINATION: ECOG PERFORMANCE STATUS: 1 - Symptomatic but completely ambulatory Vitals:   07/08/20 0918  BP: 119/75  Pulse: 74  Resp: 16  Temp: (!) 96.8 F (36 C)   Filed Weights   07/08/20 0918  Weight: 242 lb 14.4 oz (110.2 kg)    Physical Exam Constitutional:      General: He is not in acute distress.    Appearance: He is obese.  HENT:     Head: Normocephalic and atraumatic.  Eyes:     General: No scleral icterus. Cardiovascular:     Rate and Rhythm: Normal rate and regular rhythm.     Heart sounds: Normal heart sounds.  Pulmonary:     Effort: Pulmonary effort is normal. No respiratory distress.     Breath sounds: No wheezing.  Abdominal:     General: Bowel sounds are normal. There is no distension.      Palpations: Abdomen is soft.  Musculoskeletal:        General: No deformity. Normal range of motion.     Cervical back: Normal range of motion and neck supple.  Skin:    General: Skin is warm and dry.     Findings: No erythema or rash.  Neurological:     Mental Status: He is alert and oriented to person, place, and time. Mental status is at baseline.     Cranial Nerves: No cranial nerve deficit.     Coordination: Coordination normal.  Psychiatric:        Mood and Affect: Mood normal.     LABORATORY DATA:  I have reviewed the data as listed Lab Results  Component Value Date   WBC 2.6 (L) 07/08/2020   HGB 9.6 (L) 07/08/2020   HCT 27.7 (L) 07/08/2020   MCV 89.6 07/08/2020   PLT 84 (L) 07/08/2020   Recent Labs    04/01/20 0837 04/15/20 0853 04/29/20 0849 05/21/20 1253 06/18/20 0849 07/08/20 0838  NA 139 135 138 135 136 133*  K 4.3 4.3 4.2 4.9 4.4 4.4  CL 107 104 108 108 106 103  CO2 21* 21* 23 21* 21* 21*  GLUCOSE 123* 162* 109* 156* 160* 179*  BUN 24* 22* 20 32* 19 26*  CREATININE 1.69* 1.87* 1.58* 1.75* 1.54* 1.60*  CALCIUM 8.5* 8.6* 8.7* 8.7* 8.8* 8.9  GFRNONAA 44* 39* 48* 45* 52* 50*  GFRAA 51* 45* 55*  --   --   --   PROT 7.9 7.8 8.0 7.9 7.6 7.5  ALBUMIN 4.0 4.0 4.3 3.1* 3.5 3.8  AST 31 34 29 55* 34 35  ALT _0 47* 22 30  ALKPHOS 102 117 108 126 129* 125  BILITOT 0.9 0.8 1.5* 1.1 0.7 0.9   Iron/TIBC/Ferritin/ %Sat    Component Value Date/Time   IRON 52 12/18/2019 0826   IRON 105 10/15/2018 1325   TIBC 284 12/18/2019 0826   TIBC 244 (L) 10/15/2018 1325   FERRITIN 146 12/18/2019 0826   FERRITIN 588 (  H) 10/15/2018 1325   IRONPCTSAT 18 12/18/2019 0826   IRONPCTSAT 43 10/15/2018 1325      RADIOGRAPHIC STUDIES: I have personally reviewed the radiological images as listed and agreed with the findings in the report. No results found.   ASSESSMENT & PLAN:  1. Renal cell carcinoma of left kidney (HCC)   2. Encounter for antineoplastic immunotherapy    3. Thrombocytopenia ([place])   4. 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 Procedure visit nivolumab treatment today Reschedule CT chest abdomen pelvis for evaluation of disease status.  #Chronic thrombocytopenia and neutropenia, secondary to cirrhosis.  Counts are stable.  #Chronic kidney disease, creatinine slightly worse.  Encourage oral hydration avoid nephrotoxin.  #Chronic liver cirrhosis with thrombocytopenia and neutropenia.    Follow-up 2 week lab MD for assessment evaluation prior to nivolumab. All questions were answered. The patient knows to call the clinic with any problems questions or concerns.   Earlie Server, MD, PhD Hematology Oncology [facility] Pager- [phone] 07/08/2020

## 2020-07-08 NOTE — Progress Notes (Signed)
Patient tolerated infusion well. Patient and VSS. Discharged home  

## 2020-07-08 NOTE — Progress Notes (Signed)
Patient denies new problems/concerns today.   °

## 2020-07-16 ENCOUNTER — Other Ambulatory Visit: Payer: Self-pay

## 2020-07-16 ENCOUNTER — Ambulatory Visit
Admission: RE | Admit: 2020-07-16 | Discharge: 2020-07-16 | Disposition: A | Discharge status: Home / Self Care | Payer: BC Managed Care – PPO | Source: Ambulatory Visit | Attending: Oncology | Admitting: Oncology

## 2020-07-16 DIAGNOSIS — C649 Malignant neoplasm of unspecified kidney, except renal pelvis: Secondary | ICD-10-CM

## 2020-07-19 ENCOUNTER — Telehealth: Payer: Self-pay | Admitting: Oncology

## 2020-07-19 ENCOUNTER — Telehealth: Payer: Self-pay | Admitting: *Deleted

## 2020-07-19 ENCOUNTER — Telehealth: Payer: Self-pay | Admitting: Hematology and Oncology

## 2020-07-19 MED ORDER — LEVOFLOXACIN 500 MG PO TABS: 500.0000 mg | ORAL_TABLET | Freq: Every day | ORAL | 0 refills | Status: DC

## 2020-07-19 NOTE — Telephone Encounter (Signed)
Re:  Chest CT findings  Called patient to discuss chest CT results today.  Images reviewed.  Chest CT revealed new patchy ground-glass opacities throughout both lungs with central part solid/airspace components. These findings are nonspecific and could be  secondary to an atypical infection (including viral pneumonia) or inflammation (including drug reaction).  There was a new solid right perihilar nodule, possibly a reactive lymph node.  Patient currently denies any respiratory symptoms.  He feels "fine".  He has received Opdivo for about a year for metastatic renal cell carcinoma.  He has done well with his infusions except for the last 2.  After the last 2 treatments he developed fever to 102 for 2-3 days without other symptoms.  He describes being diagnosed with COVID-19 on 05/04/2020.  He received the monoclonal antibody that day. Symptoms included "feeling like I was going to die", head and sinus symptoms, high fever, and aches and pains. He denied any shortness of breath or cough. No chest imaging was performed.  Last chest imaging prior to 07/16/2020 was on 02/13/2020.  Unclear if changes on current CT do to COVID (old) or due to a drug reaction (Opdiva pneumonitis).  No plan for intervention as patient asymptomatic.  Patient scheduled for treatment this week.  He will be seeing Dr Tasia Catchings on Thursday, 07/22/2020, this week.   He was instructed to call if any respiratory symptoms or fever.   Lequita Asal, MD, PhD

## 2020-07-19 NOTE — Telephone Encounter (Signed)
Called report for Dr Tasia Catchings patient, she is on vacation  IMPRESSION: 1. New patchy ground-glass opacities throughout both lungs with central part solid/airspace components. These findings are nonspecific and could be secondary to an atypical infection (including viral pneumonia) or inflammation (including drug reaction). 2. New solid right perihilar nodule, possibly a reactive lymph node. Recommend attention on follow-up. 3. No specific evidence of metastatic disease in the chest, abdomen or pelvis. Stable small left internal mammary and upper abdominal lymph nodes. 4. Cirrhosis and portal hypertension. Mildly increased ascites with extension of ascites through a small left periumbilical hernia. 5. Aortic Atherosclerosis (ICD10-I70.0). 6. These results will be called to the ordering clinician or representative by the Radiologist Assistant, and communication documented in the PACS or [employer].   Electronically Signed   By: Richardean Sale M.D.   On: 07/19/2020 08:39

## 2020-07-19 NOTE — Telephone Encounter (Signed)
Per Dr Mike Gip check with patient to see how his breathing is and consider a COVID test. I called and spoke with Patient who reports 3 days of fever of 102 three days prior to his CT scan and states that his breathing is normal. He is going to test himself with an at home test kit for COVID and will call me back with results

## 2020-07-19 NOTE — Telephone Encounter (Signed)
Called patient and spoke with him. Recommend antibiotics for 6 days.   Faythe Casa, NP 07/19/2020 4:33 PM

## 2020-07-19 NOTE — Telephone Encounter (Signed)
Patient called back stating that his COVID test is negative and to please call him back about what needs to be done about his CT results

## 2020-07-19 NOTE — Telephone Encounter (Signed)
Re: Fever  Henry Moore is a 59 year old male with past medical history significant for hypertension, esophageal varices, cirrhosis of the liver, uncontrolled type 2 diabetes, DDD, chronic kidney disease stage III and renal cell carcinoma.  He is followed by Dr. Tasia Catchings and is status post nivolumab infusions.  He was last evaluated on 07/08/2020 and lab work was stable.  He had mild neutropenia with a WBC of 2.6, ANC 1.8, platelets 84,000 and hemoglobin 9.6.  He had CT scans completed for restaging which showed new patchy groundglass opacities throughout both lungs with central part solid airspace components.  These findings are nonspecific and could be secondary to an atypical infection including viral pneumonia or inflammation including drug reaction.  There is a new solid right perihilar nodule possibly a reactive lymph node.  Recommend attenuation to follow-up.  No specific evidence of metastatic disease in the chest abdomen or pelvis.  Stable small left internal mammary and upper abdominal lymph nodes.  Mr. Yeargan called to report 3 days of a fever- t-max 102.  Home test negative for Covid.   Would recommend to try antibiotics to see if symptoms improved.  Given he is symptomatic with a fever-antibiotics are a great option.  Reviewed allergies.  Rx Levaquin 500 mg daily X 6 days.   Patient to call if symptoms do not improve or worsen.  Keep scheduled follow-up.  Faythe Casa, NP 07/19/2020 4:30 PM

## 2020-07-22 ENCOUNTER — Inpatient Hospital Stay (HOSPITAL_BASED_OUTPATIENT_CLINIC_OR_DEPARTMENT_OTHER): Payer: BC Managed Care – PPO | Admitting: Oncology

## 2020-07-22 ENCOUNTER — Encounter: Payer: Self-pay | Admitting: Oncology

## 2020-07-22 ENCOUNTER — Inpatient Hospital Stay: Payer: BC Managed Care – PPO

## 2020-07-22 VITALS — BP 122/79 | HR 76 | Temp 96.9°F | Resp 16 | Wt 240.7 lb

## 2020-07-22 DIAGNOSIS — Z5112 Encounter for antineoplastic immunotherapy: Secondary | ICD-10-CM

## 2020-07-22 DIAGNOSIS — K7469 Other cirrhosis of liver: Secondary | ICD-10-CM

## 2020-07-22 DIAGNOSIS — C642 Malignant neoplasm of left kidney, except renal pelvis: Secondary | ICD-10-CM

## 2020-07-22 DIAGNOSIS — D696 Thrombocytopenia, unspecified: Secondary | ICD-10-CM

## 2020-07-22 DIAGNOSIS — C649 Malignant neoplasm of unspecified kidney, except renal pelvis: Secondary | ICD-10-CM

## 2020-07-22 LAB — COMPREHENSIVE METABOLIC PANEL
ALT: 26 U/L (ref 0–44)
AST: 31 U/L (ref 15–41)
Albumin: 3.8 g/dL (ref 3.5–5.0)
Alkaline Phosphatase: 121 U/L (ref 38–126)
Anion gap: 9 (ref 5–15)
BUN: 27 mg/dL — ABNORMAL HIGH (ref 6–20)
CO2: 19 mmol/L — ABNORMAL LOW (ref 22–32)
Calcium: 9 mg/dL (ref 8.9–10.3)
Chloride: 105 mmol/L (ref 98–111)
Creatinine, Ser: 1.52 mg/dL — ABNORMAL HIGH (ref 0.61–1.24)
GFR, Estimated: 53 mL/min — ABNORMAL LOW (ref >60)
Glucose, Bld: 149 mg/dL — ABNORMAL HIGH (ref 70–99)
Potassium: 4 mmol/L (ref 3.5–5.1)
Sodium: 133 mmol/L — ABNORMAL LOW (ref 135–145)
Total Bilirubin: 1.1 mg/dL (ref 0.3–1.2)
Total Protein: 7.5 g/dL (ref 6.5–8.1)

## 2020-07-22 LAB — CBC WITH DIFFERENTIAL/PLATELET
Abs Immature Granulocytes: 0.01 10*3/uL (ref 0.00–0.07)
Basophils Absolute: 0 10*3/uL (ref 0.0–0.1)
Basophils Relative: 0 %
Eosinophils Absolute: 0.1 10*3/uL (ref 0.0–0.5)
Eosinophils Relative: 5 %
HCT: 27.3 % — ABNORMAL LOW (ref 39.0–52.0)
Hemoglobin: 9.4 g/dL — ABNORMAL LOW (ref 13.0–17.0)
Immature Granulocytes: 0 %
Lymphocytes Relative: 17 %
Lymphs Abs: 0.4 10*3/uL — ABNORMAL LOW (ref 0.7–4.0)
MCH: 30.4 pg (ref 26.0–34.0)
MCHC: 34.4 g/dL (ref 30.0–36.0)
MCV: 88.3 fL (ref 80.0–100.0)
Monocytes Absolute: 0.3 10*3/uL (ref 0.1–1.0)
Monocytes Relative: 12 %
Neutro Abs: 1.6 10*3/uL — ABNORMAL LOW (ref 1.7–7.7)
Neutrophils Relative %: 66 %
Platelets: 77 10*3/uL — ABNORMAL LOW (ref 150–400)
RBC: 3.09 MIL/uL — ABNORMAL LOW (ref 4.22–5.81)
RDW: 15 % (ref 11.5–15.5)
WBC: 2.4 10*3/uL — ABNORMAL LOW (ref 4.0–10.5)
nRBC: 0 % (ref 0.0–0.2)

## 2020-07-22 LAB — TSH: TSH: 5.288 u[IU]/mL — ABNORMAL HIGH (ref 0.350–4.500)

## 2020-07-22 MED ORDER — SODIUM CHLORIDE 0.9% FLUSH
10.0000 mL | INTRAVENOUS | Status: DC | PRN
  Administered 2020-07-22: 10 mL via INTRAVENOUS
  Filled 2020-07-22: qty 10

## 2020-07-22 MED ORDER — SODIUM CHLORIDE 0.9 % IV SOLN
Freq: Once | INTRAVENOUS | Status: AC
  Filled 2020-07-22: qty 250

## 2020-07-22 MED ORDER — SODIUM CHLORIDE 0.9 % IV SOLN
240.0000 mg | Freq: Once | INTRAVENOUS | Status: AC
  Administered 2020-07-22: 240 mg via INTRAVENOUS
  Filled 2020-07-22: qty 24

## 2020-07-22 MED ORDER — HEPARIN SOD (PORK) LOCK FLUSH 100 UNIT/ML IV SOLN
500.0000 [IU] | Freq: Once | INTRAVENOUS | Status: AC
  Administered 2020-07-22: 500 [IU] via INTRAVENOUS
  Filled 2020-07-22: qty 5

## 2020-07-22 MED ORDER — HEPARIN SOD (PORK) LOCK FLUSH 100 UNIT/ML IV SOLN
INTRAVENOUS | Status: AC
  Filled 2020-07-22: qty 5

## 2020-07-22 NOTE — Progress Notes (Signed)
Hematology/Oncology follow-up [facility] Telephone:([phone] [phone]   Patient Care Team: Albina Billet, MD as PCP - General (Internal Medicine) Earlie Server, MD as Consulting Physician (Hematology and Oncology)  REFERRING PROVIDER: Albina Billet, MD  CHIEF COMPLAINTS/REASON FOR VISIT:  Follow-up for kidney cancer treatments  HISTORY OF PRESENTING ILLNESS:   Henry Moore is a  59 y.o.  male with PMH listed below was seen in consultation at the request of  Albina Billet, MD  for evaluation of kidney cancer Extensive medical records were reviewed Patient had MRI lumbar spine without contrast done on 07/13/2018 which showed mired lumbar degenerative disease. CT thoracic spine was done on 08/05/2018 which showed 5 cm left renal mass. 09/04/2018 CT abdomen and pelvis with and without contrast showed 5 cm round enhancing solid mass in the left kidney.  No involvement of left renal vein.  No lymphadenopathy Morphologic changes consistent with cirrhosis and the portal hypertension.  Small volume ascites and splenomegaly. . 10/16/2018 Left nephrectomy showed renal cell carcinoma, clear-cell type, nuclear grade 4, tumor extends into the renal vein and renal sinus fat.  Urethral, vascular and or resection margins are negative for tumor pT3a Nx Mx  01/28/2019 patient had another CT chest abdomen pelvis done with contrast Showed interval left nephrectomy.  Scattered tiny pulmonary nodules bilaterally.  Nonspecific.  No other evidence of metastatic disease.  Cirrhosis changes extensive coronary and aortic atherosclerosis  07/14/2019 patient underwent surveillance CT chest abdomen pelvis without contrast Interval progression of pulmonary metastasis with new enlarged right paratracheal lymph node 1.7 cm, previously 0.6 cm one-point since worrisome for metastatic adenopathy.  Multiple progressive pulmonary nodules, index nodule within the lingula measures 1.3 cm, previously  3 mm. Index subpleural nodule within the anterior right upper lobe measuring 1.8 cm, previously 3 cm Index nodule within the post lateral right lower lobe measuring 5 mm, new from previous care Aortic sclerosis.  Three-vessel coronary artery calcification noted.  Cirrhosis changes.  Splenomegaly.  Patient was referred to establish care with medical oncology for further discussion and evaluation of metastatic renal cell carcinoma. Patient reports feeling well at baseline today.  Denies any shortness of breath, cough, hemoptysis, back pain, headache. He quitted smoking 2015.  Not currently actively drinking alcohol as well. Patient has chronic back pain unchanged.  # Liver cirrhosis with small amount of ascites/splenomegaly patient sees gastroenterology Dr. [name].  . He will get ultrasound surveillance due in February 2021 for screening of [place].  EGD surveillance for Barrett's plan in April 2021.  # Diabetes, on Humalog sliding scale and metformin.  #Family history of cancer and personal history of RCC.  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  Status post ipilimumab and nivolumab treatment.  # 10/29/2019 CT chest abdomen pelvis without contrast images were independently reviewed by me and discussed with patient.   CT showed marked improvement with resolution of many of the pulmonary nodules, prominent reduced size of other nodules.  Considerably reduced size of the right lower paratracheal lymph node now 0.9 cm in diameter. Hepatic cirrhosis with prominent splenomegaly and trace perisplenic ascites. Chronic CT findings includes aortic atherosclerotic vascular disease.  Mild sigmoid colon diverticulosis  # CKD, he establish care with Dr. Candiss Norse for chronic  kidney disease.  Blood pressure medication has been adjusted.  Hydrochlorothiazide decreased to 12.5 mg daily.  # NGS -foundation one PD-L1 TPS 0% no reportable mutation, MS stable. TMB 8 muts/mb VHL D154fs*16, SETD2 BAP Genetic testing is negative.   INTERVAL HISTORY Henry Moore is a 59 y.o. male who has above history reviewed by me today presents for follow up visit for management of stage IV RCC  Problems and complaints are listed below: Patient had interval CT scan done.  Presents to discuss results and management plan. Today he reports no shortness of breath, cough, chest pain.   he feels that he only gets mild shortness of breath after exertion which has been his chronic baseline.  There is no significant change.   He reports 2 episodes of 1-2 days afterof fever the last 2 immunotherapy treatments.  Symptoms are self-limiting.  Today no fever   Review of Systems  Constitutional: Positive for fatigue. Negative for appetite change, chills, fever and unexpected weight change.  HENT:   Negative for hearing loss and voice change.   Eyes: Negative for eye problems and icterus.  Respiratory: Negative for chest tightness, cough and shortness of breath.   Cardiovascular: Negative for chest pain and leg swelling.  Gastrointestinal: Negative for abdominal distention and abdominal pain.  Endocrine: Negative for hot flashes.  Genitourinary: Negative for difficulty urinating, dysuria and frequency.   Musculoskeletal: Negative for arthralgias.       Chronic back pain  Skin: Negative for itching and rash.  Neurological: Negative for light-headedness and numbness.  Hematological: Negative for adenopathy. Does not bruise/bleed easily.  Psychiatric/Behavioral: Negative for confusion.    MEDICAL HISTORY:  Past Medical History:  Diagnosis Date  . Cough    SINUS DRAINAGE CAUSING COUGHING , REPORTS CLEAR MUCOUS   . Diabetes mellitus without complication ([place])   . Family history of breast  cancer   . Family history of colon cancer   . Family history of stomach cancer   . HTN (hypertension)   . Hyperlipemia   . Left renal mass   . Platelets decreased ([place])    DENIES UNSUAL BLEEDING   . PONV (postoperative nausea and vomiting)   . Renal cell carcinoma ([place]) 08/04/2019  . WBC decreased     SURGICAL HISTORY: Past Surgical History:  Procedure Laterality Date  . ANKLE SURGERY Left   . ANTERIOR CERVICAL DECOMP/DISCECTOMY FUSION N/A 04/16/2014   Procedure: ANTERIOR CERVICAL DECOMPRESSION/DISCECTOMY FUSION  (ACDF C5-C7)   (2 LEVELS)     ;  Surgeon: Melina Schools, MD;  Location: [facility];  Service: Orthopedics;  Laterality: N/A;  . APPENDECTOMY    . BACK SURGERY    . ESOPHAGOGASTRODUODENOSCOPY (EGD) WITH PROPOFOL N/A 02/06/2019   Procedure: ESOPHAGOGASTRODUODENOSCOPY (EGD) WITH PROPOFOL;  Surgeon: [name], MD;  Location: [facility] ENDOSCOPY;  Service: Gastroenterology;  Laterality: N/A;  . ESOPHAGOGASTRODUODENOSCOPY (EGD) WITH PROPOFOL N/A 03/04/2019   Procedure: ESOPHAGOGASTRODUODENOSCOPY (EGD) WITH PROPOFOL;  Surgeon: Lin Landsman, MD;  Location: [facility] ENDOSCOPY;  Service: Gastroenterology;  Laterality: N/A;  . ESOPHAGOGASTRODUODENOSCOPY (EGD) WITH PROPOFOL N/A 04/22/2019   Procedure: ESOPHAGOGASTRODUODENOSCOPY (EGD) WITH PROPOFOL;  Surgeon: [name], MD;  Location: [facility] END[facility]COPY;  Service: Gastroenterology;  Laterality: N/A;  . FRACTURE SURGERY    . HYDROCELE EXCISION    . KNEE ARTHROSCOPY Bilateral   . LAPAROSCOPIC NEPHRECTOMY, HAND ASSISTED Left 10/16/2018   Procedure: LEFT HAND ASSISTED LAPAROSCOPIC NEPHRECTOMY;  Surgeon: Lucas Mallow, MD;  Location: [facility] ORS;  Service: Urology;  Laterality: Left;  . NASAL  SINUS SURGERY     X 2  . 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 PORTA CATH INSERTION N/A 11/11/2019   Procedure: PORTA CATH INSERTION;  Surgeon: Katha Cabal, MD;  Location: [place] CV LAB;  Service: Cardiovascular;  Laterality: N/A;  . SHOULDER  SURGERY Left     SOCIAL HISTORY: Social History   Socioeconomic History  . Marital status: Married    Spouse name: Not on file  . Number of children: Not on file  . Years of education: Not on file  . Highest education level: Not on file  Occupational History  . Not on file  Tobacco Use  . Smoking status: Former Smoker    Packs/day: 0.25    Years: 20.00    Pack years: 5.00    Quit date: 2015    Years since quitting: 6.9  . Smokeless tobacco: Never Used  Vaping Use  . Vaping Use: Never used  Substance and Sexual Activity  . Alcohol use: Not Currently    Alcohol/week: 0.0 standard drinks    Comment: no alchol in 1 year  . Drug use: No  . Sexual activity: Not on file  Other Topics Concern  . Not on file  Social History Narrative  . Not on file   Social Determinants of Health   Financial Resource Strain: Not on file  Food Insecurity: Not on file  Transportation Needs: Not on file  Physical Activity: Not on file  Stress: Not on file  Social Connections: Not on file  Intimate Partner Violence: Not on file    FAMILY HISTORY: Family History  Problem Relation Age of Onset  . Diabetes Mother   . Cancer Mother        neuroendocrine cancer  . Cancer Father        neuroendocrine cancer of meninges  . Cancer Maternal Grandmother        tomach dx 65  . Alzheimer's disease Paternal Grandmother   . Lung cancer Paternal Grandfather   . Lung cancer Maternal Aunt   . Colon cancer Maternal Uncle   . Breast cancer Paternal Aunt        both dx 65s  . Ovarian cancer Other        dx 66  . Breast cancer Cousin     ALLERGIES:  is allergic to codeine and mucinex [guaifenesin er].  MEDICATIONS:  Current Outpatient Medications  Medication Sig Dispense Refill  . ACCU-CHEK AVIVA PLUS test strip CHECK BL00D SUGAR ONCE OR TWICE DAILY. 100 each PRN  . carvedilol (COREG) 25 MG tablet Take 1 tablet (25 mg total) by mouth 2 (two) times daily with a meal. 180 tablet 3  .  Cholecalciferol (VITAMIN D3) 125 MCG (5000 UT) CAPS Take 5,000 Units by mouth daily.    Marland Kitchen glipiZIDE (GLUCOTROL) 5 MG tablet Take 1 tablet (5 mg total) by mouth daily before breakfast. 90 tablet 3  . hydrochlorothiazide (HYDRODIURIL) 25 MG tablet TAKE 1 TABLET DAILY. 90 tablet 0  . levofloxacin (LEVAQUIN) 500 MG tablet Take 1 tablet (500 mg total) by mouth daily. 6 tablet 0  . losartan (COZAAR) 100 MG tablet Take 1 tablet by mouth daily. 90 tablet 0  . lovastatin (MEVACOR) 40 MG tablet Take 1 tablet (40 mg total) by mouth at bedtime. 60 tablet 0  . metFORMIN (GLUCOPHAGE-XR) 500 MG 24 hr tablet Take 500 mg by mouth 2 (two) times daily.     . vitamin B-12 (CYANOCOBALAMIN) 1000 MCG tablet  Take 1 tablet (1,000 mcg total) by mouth daily. 90 tablet 0  . amLODipine (NORVASC) 10 MG tablet Take 10 mg by mouth daily. (Patient not taking: No sig reported)    . insulin lispro (INSULIN LISPRO) 100 UNIT/ML KwikPen Junior Inject into the skin 3 (three) times daily as needed (Above 250). Sliding scale (Patient not taking: No sig reported)    . prochlorperazine (COMPAZINE) 10 MG tablet Take 1 tablet (10 mg total) by mouth every 6 (six) hours as needed for nausea or vomiting. (Patient not taking: No sig reported) 30 tablet 0   No current facility-administered medications for this visit.   Facility-Administered Medications Ordered in Other Visits  Medication Dose Route Frequency Provider Last Rate Last Admin  . sodium chloride flush (NS) 0.9 % injection 10 mL  10 mL Intravenous PRN Earlie Server, MD   10 mL at 01/22/20 0831  . sodium chloride flush (NS) 0.9 % injection 10 mL  10 mL Intravenous PRN Earlie Server, MD   10 mL at 07/22/20 0921     PHYSICAL EXAMINATION: ECOG PERFORMANCE STATUS: 1 - Symptomatic but completely ambulatory Vitals:   07/22/20 0949  BP: 122/79  Pulse: 76  Resp: 16  Temp: (!) 96.9 F (36.1 C)   Filed Weights   07/22/20 0949  Weight: 240 lb 11.2 oz (109.2 kg)    Physical  Exam Constitutional:      General: He is not in acute distress.    Appearance: He is obese.  HENT:     Head: Normocephalic and atraumatic.  Eyes:     General: No scleral icterus. Cardiovascular:     Rate and Rhythm: Normal rate and regular rhythm.     Heart sounds: Normal heart sounds.  Pulmonary:     Effort: Pulmonary effort is normal. No respiratory distress.     Breath sounds: No wheezing.  Abdominal:     General: Bowel sounds are normal. There is no distension.     Palpations: Abdomen is soft.  Musculoskeletal:        General: No deformity. Normal range of motion.     Cervical back: Normal range of motion and neck supple.  Skin:    General: Skin is warm and dry.     Findings: No erythema or rash.  Neurological:     Mental Status: He is alert and oriented to person, place, and time. Mental status is at baseline.     Cranial Nerves: No cranial nerve deficit.     Coordination: Coordination normal.  Psychiatric:        Mood and Affect: Mood normal.     LABORATORY DATA:  I have reviewed the data as listed Lab Results  Component Value Date   WBC 2.4 (L) 07/22/2020   HGB 9.4 (L) 07/22/2020   HCT 27.3 (L) 07/22/2020   MCV 88.3 07/22/2020   PLT 77 (L) 07/22/2020   Recent Labs    04/01/20 0837 04/15/20 0853 04/29/20 0849 05/21/20 1253 06/18/20 0849 07/08/20 0838 07/22/20 0921  NA 139 135 138   < > 136 133* 133*  K 4.3 4.3 4.2   < > 4.4 4.4 4.0  CL 107 104 108   < > 106 103 105  CO2 21* 21* 23   < > 21* 21* 19*  GLUCOSE 123* 162* 109*   < > 160* 179* 149*  BUN 24* 22* 20   < > 19 26* 27*  CREATININE 1.69* 1.87* 1.58*   < > 1.54* 1.60* 1.52*  CALCIUM 8.5* 8.6* 8.7*   < > 8.8* 8.9 9.0  GFRNONAA 44* 39* 48*   < > 52* 50* 53*  GFRAA 51* 45* 55*  --   --   --   --   PROT 7.9 7.8 8.0   < > 7.6 7.5 7.5  ALBUMIN 4.0 4.0 4.3   < > 3.5 3.8 3.8  AST 31 34 29   < > 34 35 31  ALT $Re'24 27 21   'szz$ < > $R'22 30 26  'Og$ ALKPHOS 102 117 108   < > 129* 125 121  BILITOT 0.9 0.8 1.5*   < >  0.7 0.9 1.1   < > = values in this interval not displayed.   Iron/TIBC/Ferritin/ %Sat    Component Value Date/Time   IRON 52 12/18/2019 0826   IRON 105 10/15/2018 1325   TIBC 284 12/18/2019 0826   TIBC 244 (L) 10/15/2018 1325   FERRITIN 146 12/18/2019 0826   FERRITIN 588 (H) 10/15/2018 1325   IRONPCTSAT 18 12/18/2019 0826   IRONPCTSAT 43 10/15/2018 1325      RADIOGRAPHIC STUDIES: I have personally reviewed the radiological images as listed and agreed with the findings in the report. CT CHEST ABDOMEN PELVIS WO CONTRAST  Result Date: 07/19/2020 CLINICAL DATA:  Restaging metastatic renal cell carcinoma post left nephrectomy. No current complaints. EXAM: CT CHEST, ABDOMEN AND PELVIS WITHOUT CONTRAST TECHNIQUE: Multidetector CT imaging of the chest, abdomen and pelvis was performed following the standard protocol without IV contrast. COMPARISON:  Previous CT 02/13/2020 FINDINGS: CT CHEST FINDINGS Cardiovascular: Right IJ Port-A-Cath extends to the level of the superior right atrium. There is diffuse atherosclerosis of the aorta, great vessels and coronary arteries. No acute vascular findings on noncontrast imaging. The heart size is normal. There is no pericardial effusion. Mediastinum/Nodes: Stable prominent left internal mammary lymph nodes measuring up to 5 mm on image 25/2. Small mediastinal lymph nodes are unchanged. There are no enlarged mediastinal, hilar or axillary lymph nodes. Hilar assessment is limited by the lack of intravenous contrast, although the hilar contours appear unchanged. The thyroid gland, trachea and esophagus demonstrate no significant findings. Lungs/Pleura: There is no pleural effusion. There are new patchy ground-glass opacities throughout both lungs. Some of the subpleural components have central part solid/airspace components. There is a new solid right perihilar nodule measuring 9 x 6 mm on image 82/3 which may reflect a lymph node. No other solid pulmonary nodules  identified. Musculoskeletal/Chest wall: No chest wall mass or suspicious osseous findings. Bilateral gynecomastia and previous lower cervical fusion noted. CT ABDOMEN AND PELVIS FINDINGS Hepatobiliary: The liver appears grossly stable as imaged in the noncontrast state with diffuse contour irregularity and relative enlargement of the left and caudate lobes, consistent with cirrhosis. No focal lesions identified. No evidence of gallstones, gallbladder wall thickening or biliary dilatation. Pancreas: Unremarkable. No pancreatic ductal dilatation or surrounding inflammatory changes. Spleen: Stable moderate splenomegaly. Adrenals/Urinary Tract: Both adrenal glands appear normal. Previous left nephrectomy without mass in the nephrectomy bed. The right kidney appears unremarkable as imaged in the noncontrast state. The bladder appears unremarkable for its degree of distention. Stomach/Bowel: No evidence of bowel wall thickening, distention or surrounding inflammatory change. Incomplete distension of the cecum. Vascular/Lymphatic: Scattered small lymph nodes in the gastrohepatic ligament and upper retroperitoneum are stable, likely related to the patient's cirrhosis. No progressive adenopathy identified. Diffuse aortic and branch vessel atherosclerosis again noted. Reproductive: The prostate gland and seminal vesicles appear unremarkable. Penile prosthesis and reservoir are  again noted. Other: Abdominal ascites has mildly increased in volume. There is extension of ascites through a small left periumbilical hernia (image 898/4). No herniated bowel or peritoneal nodularity identified. Grossly stable edema throughout the mesenteric fat. Musculoskeletal: No acute or significant osseous findings. IMPRESSION: 1. New patchy ground-glass opacities throughout both lungs with central part solid/airspace components. These findings are nonspecific and could be secondary to an atypical infection (including viral pneumonia) or  inflammation (including drug reaction). 2. New solid right perihilar nodule, possibly a reactive lymph node. Recommend attention on follow-up. 3. No specific evidence of metastatic disease in the chest, abdomen or pelvis. Stable small left internal mammary and upper abdominal lymph nodes. 4. Cirrhosis and portal hypertension. Mildly increased ascites with extension of ascites through a small left periumbilical hernia. 5. Aortic Atherosclerosis (ICD10-I70.0). 6. These results will be called to the ordering clinician or representative by the Radiologist Assistant, and communication documented in the PACS or [employer]. Electronically Signed   By: Richardean Sale M.D.   On: 07/19/2020 08:39     ASSESSMENT & PLAN:  1. Renal cell carcinoma of left kidney (HCC)   2. Encounter for antineoplastic immunotherapy   3. Thrombocytopenia ([place])   4. 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 07/16/2020, CT chest abdomen pelvis images were reviewed and discussed with patient Compared to July 2021 image, he has had a new patchy groundglass opacities throughout both lungs with central part of solids/airspace components.  Findings are nonspecific and could be secondary to an atypical infection, including viral pneumonia or inflammation.  Patient is asymptomatic.  I wonder the changes are secondary to his Covid infection.  Patient has been started on Levaquin to cover atypical infection by on-call physician and I recommend him to continue and complete the course. Proceed with today's immunotherapy nivolumab.  Advised patient to notify me if his symptoms get worse.  #Chronic thrombocytopenia and neutropenia, secondary to cirrhosis.  Stable counts.  Continue monitor.  #Chronic kidney disease, creatinine stable.  Encourage oral hydration.  #Chronic liver cirrhosis with thrombocytopenia and neutropenia.  Counts are  stable.  Follow-up 2 week lab MD for assessment evaluation prior to nivolumab. All questions were answered. The patient knows to call the clinic with any problems questions or concerns.   Earlie Server, MD, PhD Hematology Oncology [facility] at [facility] Pager- [phone] 07/22/2020

## 2020-07-22 NOTE — Progress Notes (Signed)
1200- Patient tolerated treatment well. Patient stable and discharged to home at this time.

## 2020-07-22 NOTE — Progress Notes (Signed)
Patient is currently taking Levaquin for pneumonia seen on CT scan.

## 2020-08-05 ENCOUNTER — Encounter: Payer: Self-pay | Admitting: Oncology

## 2020-08-05 ENCOUNTER — Inpatient Hospital Stay (HOSPITAL_BASED_OUTPATIENT_CLINIC_OR_DEPARTMENT_OTHER): Payer: Medicare Other | Admitting: Oncology

## 2020-08-05 ENCOUNTER — Other Ambulatory Visit: Payer: Self-pay

## 2020-08-05 ENCOUNTER — Inpatient Hospital Stay: Payer: Medicare Other | Attending: Oncology

## 2020-08-05 ENCOUNTER — Inpatient Hospital Stay: Payer: Medicare Other

## 2020-08-05 VITALS — BP 112/74 | HR 79 | Temp 96.8°F | Resp 18 | Wt 242.9 lb

## 2020-08-05 DIAGNOSIS — Z809 Family history of malignant neoplasm, unspecified: Secondary | ICD-10-CM | POA: Diagnosis not present

## 2020-08-05 DIAGNOSIS — R161 Splenomegaly, not elsewhere classified: Secondary | ICD-10-CM | POA: Insufficient documentation

## 2020-08-05 DIAGNOSIS — K7460 Unspecified cirrhosis of liver: Secondary | ICD-10-CM | POA: Insufficient documentation

## 2020-08-05 DIAGNOSIS — Z800 Family history of malignant neoplasm of digestive organs: Secondary | ICD-10-CM | POA: Diagnosis not present

## 2020-08-05 DIAGNOSIS — Z87891 Personal history of nicotine dependence: Secondary | ICD-10-CM | POA: Insufficient documentation

## 2020-08-05 DIAGNOSIS — D631 Anemia in chronic kidney disease: Secondary | ICD-10-CM | POA: Diagnosis not present

## 2020-08-05 DIAGNOSIS — E1122 Type 2 diabetes mellitus with diabetic chronic kidney disease: Secondary | ICD-10-CM | POA: Diagnosis not present

## 2020-08-05 DIAGNOSIS — I2510 Atherosclerotic heart disease of native coronary artery without angina pectoris: Secondary | ICD-10-CM | POA: Diagnosis not present

## 2020-08-05 DIAGNOSIS — R188 Other ascites: Secondary | ICD-10-CM | POA: Diagnosis not present

## 2020-08-05 DIAGNOSIS — N1832 Chronic kidney disease, stage 3b: Secondary | ICD-10-CM | POA: Diagnosis not present

## 2020-08-05 DIAGNOSIS — D696 Thrombocytopenia, unspecified: Secondary | ICD-10-CM | POA: Diagnosis not present

## 2020-08-05 DIAGNOSIS — Z5112 Encounter for antineoplastic immunotherapy: Secondary | ICD-10-CM | POA: Insufficient documentation

## 2020-08-05 DIAGNOSIS — C642 Malignant neoplasm of left kidney, except renal pelvis: Secondary | ICD-10-CM | POA: Insufficient documentation

## 2020-08-05 DIAGNOSIS — Z801 Family history of malignant neoplasm of trachea, bronchus and lung: Secondary | ICD-10-CM | POA: Diagnosis not present

## 2020-08-05 DIAGNOSIS — I700 Atherosclerosis of aorta: Secondary | ICD-10-CM | POA: Insufficient documentation

## 2020-08-05 DIAGNOSIS — I129 Hypertensive chronic kidney disease with stage 1 through stage 4 chronic kidney disease, or unspecified chronic kidney disease: Secondary | ICD-10-CM | POA: Diagnosis not present

## 2020-08-05 DIAGNOSIS — E785 Hyperlipidemia, unspecified: Secondary | ICD-10-CM | POA: Diagnosis not present

## 2020-08-05 DIAGNOSIS — C649 Malignant neoplasm of unspecified kidney, except renal pelvis: Secondary | ICD-10-CM

## 2020-08-05 DIAGNOSIS — Z79899 Other long term (current) drug therapy: Secondary | ICD-10-CM | POA: Diagnosis not present

## 2020-08-05 DIAGNOSIS — Z803 Family history of malignant neoplasm of breast: Secondary | ICD-10-CM | POA: Diagnosis not present

## 2020-08-05 DIAGNOSIS — K766 Portal hypertension: Secondary | ICD-10-CM | POA: Diagnosis not present

## 2020-08-05 DIAGNOSIS — D708 Other neutropenia: Secondary | ICD-10-CM

## 2020-08-05 DIAGNOSIS — C7800 Secondary malignant neoplasm of unspecified lung: Secondary | ICD-10-CM | POA: Insufficient documentation

## 2020-08-05 LAB — COMPREHENSIVE METABOLIC PANEL
ALT: 23 U/L (ref 0–44)
AST: 28 U/L (ref 15–41)
Albumin: 3.8 g/dL (ref 3.5–5.0)
Alkaline Phosphatase: 130 U/L — ABNORMAL HIGH (ref 38–126)
Anion gap: 9 (ref 5–15)
BUN: 23 mg/dL — ABNORMAL HIGH (ref 6–20)
CO2: 21 mmol/L — ABNORMAL LOW (ref 22–32)
Calcium: 8.9 mg/dL (ref 8.9–10.3)
Chloride: 103 mmol/L (ref 98–111)
Creatinine, Ser: 1.67 mg/dL — ABNORMAL HIGH (ref 0.61–1.24)
GFR, Estimated: 47 mL/min — ABNORMAL LOW (ref >60)
Glucose, Bld: 204 mg/dL — ABNORMAL HIGH (ref 70–99)
Potassium: 4.1 mmol/L (ref 3.5–5.1)
Sodium: 133 mmol/L — ABNORMAL LOW (ref 135–145)
Total Bilirubin: 1 mg/dL (ref 0.3–1.2)
Total Protein: 7.9 g/dL (ref 6.5–8.1)

## 2020-08-05 LAB — CBC WITH DIFFERENTIAL/PLATELET
Abs Immature Granulocytes: 0.01 10*3/uL (ref 0.00–0.07)
Basophils Absolute: 0 10*3/uL (ref 0.0–0.1)
Basophils Relative: 1 %
Eosinophils Absolute: 0.1 10*3/uL (ref 0.0–0.5)
Eosinophils Relative: 6 %
HCT: 29.6 % — ABNORMAL LOW (ref 39.0–52.0)
Hemoglobin: 10.2 g/dL — ABNORMAL LOW (ref 13.0–17.0)
Immature Granulocytes: 1 %
Lymphocytes Relative: 20 %
Lymphs Abs: 0.4 10*3/uL — ABNORMAL LOW (ref 0.7–4.0)
MCH: 30.4 pg (ref 26.0–34.0)
MCHC: 34.5 g/dL (ref 30.0–36.0)
MCV: 88.4 fL (ref 80.0–100.0)
Monocytes Absolute: 0.3 10*3/uL (ref 0.1–1.0)
Monocytes Relative: 12 %
Neutro Abs: 1.3 10*3/uL — ABNORMAL LOW (ref 1.7–7.7)
Neutrophils Relative %: 60 %
Platelets: 76 10*3/uL — ABNORMAL LOW (ref 150–400)
RBC: 3.35 MIL/uL — ABNORMAL LOW (ref 4.22–5.81)
RDW: 14.6 % (ref 11.5–15.5)
WBC: 2.2 10*3/uL — ABNORMAL LOW (ref 4.0–10.5)
nRBC: 0 % (ref 0.0–0.2)

## 2020-08-05 LAB — T4, FREE: Free T4: 0.97 ng/dL (ref 0.61–1.12)

## 2020-08-05 MED ORDER — HEPARIN SOD (PORK) LOCK FLUSH 100 UNIT/ML IV SOLN
500.0000 [IU] | Freq: Once | INTRAVENOUS | Status: DC | PRN
  Filled 2020-08-05: qty 5

## 2020-08-05 MED ORDER — SODIUM CHLORIDE 0.9 % IV SOLN
240.0000 mg | Freq: Once | INTRAVENOUS | Status: AC
  Administered 2020-08-05: 240 mg via INTRAVENOUS
  Filled 2020-08-05: qty 24

## 2020-08-05 MED ORDER — HEPARIN SOD (PORK) LOCK FLUSH 100 UNIT/ML IV SOLN
INTRAVENOUS | Status: AC
  Filled 2020-08-05: qty 5

## 2020-08-05 MED ORDER — SODIUM CHLORIDE 0.9% FLUSH
10.0000 mL | INTRAVENOUS | Status: DC | PRN
  Administered 2020-08-05: 10 mL
  Filled 2020-08-05: qty 10

## 2020-08-05 MED ORDER — HEPARIN SOD (PORK) LOCK FLUSH 100 UNIT/ML IV SOLN
500.0000 [IU] | Freq: Once | INTRAVENOUS | Status: AC
  Administered 2020-08-05: 500 [IU] via INTRAVENOUS
  Filled 2020-08-05: qty 5

## 2020-08-05 MED ORDER — SODIUM CHLORIDE 0.9% FLUSH
10.0000 mL | Freq: Once | INTRAVENOUS | Status: AC
  Administered 2020-08-05: 10 mL via INTRAVENOUS
  Filled 2020-08-05: qty 10

## 2020-08-05 MED ORDER — SODIUM CHLORIDE 0.9 % IV SOLN
Freq: Once | INTRAVENOUS | Status: AC
  Filled 2020-08-05: qty 250

## 2020-08-05 NOTE — Progress Notes (Signed)
Pt ambulatory and stable upon discharge.

## 2020-08-05 NOTE — Progress Notes (Signed)
Hematology/Oncology follow-up [facility] Telephone:([phone] [phone]   Patient Care Team: Albina Billet, MD as PCP - General (Internal Medicine) Earlie Server, MD as Consulting Physician (Hematology and Oncology)  REFERRING PROVIDER: Albina Billet, MD  CHIEF COMPLAINTS/REASON FOR VISIT:  Follow-up for kidney cancer treatments  HISTORY OF PRESENTING ILLNESS:   Henry Moore is a  60 y.o.  male with PMH listed below was seen in consultation at the request of  Albina Billet, MD  for evaluation of kidney cancer Extensive medical records were reviewed Patient had MRI lumbar spine without contrast done on 07/13/2018 which showed mired lumbar degenerative disease. CT thoracic spine was done on 08/05/2018 which showed 5 cm left renal mass. 09/04/2018 CT abdomen and pelvis with and without contrast showed 5 cm round enhancing solid mass in the left kidney.  No involvement of left renal vein.  No lymphadenopathy Morphologic changes consistent with cirrhosis and the portal hypertension.  Small volume ascites and splenomegaly. . 10/16/2018 Left nephrectomy showed renal cell carcinoma, clear-cell type, nuclear grade 4, tumor extends into the renal vein and renal sinus fat.  Urethral, vascular and or resection margins are negative for tumor pT3a Nx Mx  01/28/2019 patient had another CT chest abdomen pelvis done with contrast Showed interval left nephrectomy.  Scattered tiny pulmonary nodules bilaterally.  Nonspecific.  No other evidence of metastatic disease.  Cirrhosis changes extensive coronary and aortic atherosclerosis  07/14/2019 patient underwent surveillance CT chest abdomen pelvis without contrast Interval progression of pulmonary metastasis with new enlarged right paratracheal lymph node 1.7 cm, previously 0.6 cm one-point since worrisome for metastatic adenopathy.  Multiple progressive pulmonary nodules, index nodule within the lingula measures 1.3 cm, previously  3 mm. Index subpleural nodule within the anterior right upper lobe measuring 1.8 cm, previously 3 cm Index nodule within the post lateral right lower lobe measuring 5 mm, new from previous care Aortic sclerosis.  Three-vessel coronary artery calcification noted.  Cirrhosis changes.  Splenomegaly.  Patient was referred to establish care with medical oncology for further discussion and evaluation of metastatic renal cell carcinoma. Patient reports feeling well at baseline today.  Denies any shortness of breath, cough, hemoptysis, back pain, headache. He quitted smoking 2015.  Not currently actively drinking alcohol as well. Patient has chronic back pain unchanged.  # Liver cirrhosis with small amount of ascites/splenomegaly patient sees gastroenterology Dr. [name].  . He will get ultrasound surveillance due in February 2021 for screening of [place].  EGD surveillance for Barrett's plan in April 2021.  # Diabetes, on Humalog sliding scale and metformin.  #Family history of cancer and personal history of RCC.  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  Status post ipilimumab and nivolumab treatment.  # 10/29/2019 CT chest abdomen pelvis without contrast images were independently reviewed by me and discussed with patient.   CT showed marked improvement with resolution of many of the pulmonary nodules, prominent reduced size of other nodules.  Considerably reduced size of the right lower paratracheal lymph node now 0.9 cm in diameter. Hepatic cirrhosis with prominent splenomegaly and trace perisplenic ascites. Chronic CT findings includes aortic atherosclerotic vascular disease.  Mild sigmoid colon diverticulosis  # CKD, he establish care with Dr. Candiss Norse for chronic  kidney disease.  Blood pressure medication has been adjusted.  Hydrochlorothiazide decreased to 12.5 mg daily.  # NGS -foundation one PD-L1 TPS 0% no reportable mutation, MS stable. TMB 8 muts/mb VHL [id number]*16, SETD2 BAP Genetic testing is negative.   # 07/16/2020, CT chest abdomen pelvis images were reviewed and discussed with patient Compared to July 2021 image, he has had a new patchy groundglass opacities throughout both lungs with central part of solids/airspace components.  Findings are nonspecific and could be secondary to an atypical infection, including viral pneumonia or inflammation.  Patient is asymptomatic.  I wonder the changes are secondary to his Covid infection.  Patient has been started on Levaquin to cover atypical infection by on-call physician and I recommend him to continue and complete the course. Proceed with today's immunotherapy nivolumab.  Advised patient to notify me if his symptoms get worse.  INTERVAL HISTORY Henry Moore is a 60 y.o. male who has above history reviewed by me today presents for follow up visit for management of stage IV RCC  Problems and complaints are listed below: Patient reports no fever, chills, nausea vomiting diarrhea, cough, rash, diarrhea shortness of breath more than chronic No new complaints.    Review of Systems  Constitutional: Positive for fatigue. Negative for appetite change, chills, fever and unexpected weight change.  HENT:   Negative for hearing loss and voice change.   Eyes: Negative for eye problems and icterus.  Respiratory: Negative for chest tightness, cough and shortness of breath.   Cardiovascular: Negative for chest pain and leg swelling.  Gastrointestinal: Negative for abdominal distention and abdominal pain.  Endocrine: Negative for hot flashes.  Genitourinary: Negative for difficulty urinating, dysuria and frequency.   Musculoskeletal: Negative for arthralgias.       Chronic back pain  Skin: Negative for itching  and rash.  Neurological: Negative for light-headedness and numbness.  Hematological: Negative for adenopathy. Does not bruise/bleed easily.  Psychiatric/Behavioral: Negative for confusion.    MEDICAL HISTORY:  Past Medical History:  Diagnosis Date  . Cough    SINUS DRAINAGE CAUSING COUGHING , REPORTS CLEAR MUCOUS   . Diabetes mellitus without complication ([place])   . Family history of breast cancer   . Family history of colon cancer   . Family history of stomach cancer   . HTN (hypertension)   . Hyperlipemia   . Left renal mass   . Platelets decreased ([place])    DENIES UNSUAL BLEEDING   . PONV (postoperative nausea and vomiting)   . Renal cell carcinoma ([place]) 08/04/2019  . WBC decreased     SURGICAL HISTORY: Past Surgical History:  Procedure Laterality Date  . ANKLE SURGERY Left   . ANTERIOR CERVICAL DECOMP/DISCECTOMY FUSION N/A 04/16/2014   Procedure: ANTERIOR CERVICAL DECOMPRESSION/DISCECTOMY FUSION  (ACDF C5-C7)   (2 LEVELS)     ;  Surgeon: Melina Schools, MD;  Location: [facility];  Service: Orthopedics;  Laterality: N/A;  . APPENDECTOMY    . BACK SURGERY    . ESOPHAGOGASTRODUODENOSCOPY (EGD) WITH PROPOFOL N/A 02/06/2019   Procedure: ESOPHAGOGASTRODUODENOSCOPY (EGD) WITH PROPOFOL;  Surgeon: [name], MD;  Location[facility] ENDOSCOPY;  Service: Gastroenterology;  Laterality: N/A;  . ESOPHAGOGASTRODUODENOSCOPY (EGD) WITH PROPOFOL N/A 03/04/2019   Procedure: ESOPHAGOGASTRODUODENOSCOPY (EGD) WITH PROPOFOL;  Surgeon: Lin Landsman, MD;  Location: [facility] ENDOSCOPY;  Service: Gastroenterology;  Laterality: N/A;  . ESOPHAGOGASTRODUODENOSCOPY (EGD) WITH PROPOFOL N/A 04/22/2019   Procedure: ESOPHAGOGASTRODUODENOSCOPY (EGD) WITH PROPOFOL;  Surgeon: [name], MD;  Location: [facility] ENDOSCOPY;  Service: Gastroenterology;  Laterality:  N/A;  . FRACTURE SURGERY    . HYDROCELE EXCISION    . KNEE ARTHROSCOPY Bilateral   . LAPAROSCOPIC NEPHRECTOMY, HAND ASSISTED Left 10/16/2018   Procedure: LEFT HAND  ASSISTED LAPAROSCOPIC NEPHRECTOMY;  Surgeon: Lucas Mallow, MD;  Location: [facility]S;  Service: Urology;  Laterality: Left;  . NASAL SINUS SURGERY     X 2  . 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 PORTA CATH INSERTION N/A 11/11/2019   Procedure: PORTA CATH INSERTION;  Surgeon: Katha Cabal, MD;  Location: [place] [facility];  Service: Cardiovascular;  Laterality: N/A;  . SHOULDER SURGERY Left     SOCIAL HISTORY: Social History   Socioeconomic History  . Marital status: Married    Spouse name: Not on file  . Number of children: Not on file  . Years of education: Not on file  . Highest education level: Not on file  Occupational History  . Not on file  Tobacco Use  . Smoking status: Former Smoker    Packs/day: 0.25    Years: 20.00    Pack years: 5.00    Quit date: 2015    Years since quitting: 7.0  . Smokeless tobacco: Never Used  Vaping Use  . Vaping Use: Never used  Substance and Sexual Activity  . Alcohol use: Not Currently    Alcohol/week: 0.0 standard drinks    Comment: no alchol in 1 year  . Drug use: No  . Sexual activity: Not on file  Other Topics Concern  . Not on file  Social History Narrative  . Not on file   Social Determinants of Health   Financial Resource Strain: Not on file  Food Insecurity: Not on file  Transportation Needs: Not on file  Physical Activity: Not on file  Stress: Not on file  Social Connections: Not on file  Intimate Partner Violence: Not on file    FAMILY HISTORY: Family History  Problem Relation Age of Onset  . Diabetes Mother   . Cancer Mother        neuroendocrine cancer  . Cancer Father        neuroendocrine cancer of meninges  . Cancer Maternal Grandmother        tomach dx 59  . Alzheimer's disease Paternal Grandmother   . Lung cancer Paternal Grandfather   . Lung cancer Maternal Aunt   . Colon cancer Maternal Uncle   . Breast cancer Paternal Aunt        both dx 67s  . Ovarian cancer Other        dx 65   . Breast cancer Cousin     ALLERGIES:  is allergic to codeine and mucinex [guaifenesin er].  MEDICATIONS:  Current Outpatient Medications  Medication Sig Dispense Refill  . ACCU-CHEK AVIVA PLUS test strip CHECK BL00D SUGAR ONCE OR TWICE DAILY. 100 each PRN  . amLODipine (NORVASC) 10 MG tablet Take 10 mg by mouth daily. (Patient not taking: No sig reported)    . carvedilol (COREG) 25 MG tablet Take 1 tablet (25 mg total) by mouth 2 (two) times daily with a meal. 180 tablet 3  . Cholecalciferol (VITAMIN D3) 125 MCG (5000 UT) CAPS Take 5,000 Units by mouth daily.    Marland Kitchen glipiZIDE (GLUCOTROL) 5 MG tablet Take 1 tablet (5 mg total) by mouth daily before breakfast. 90 tablet 3  . hydrochlorothiazide (HYDRODIURIL) 25 MG tablet TAKE 1 TABLET DAILY. 90 tablet 0  . insulin lispro (INSULIN LISPRO) 100 UNIT/ML  KwikPen Junior Inject into the skin 3 (three) times daily as needed (Above 250). Sliding scale (Patient not taking: No sig reported)    . levofloxacin (LEVAQUIN) 500 MG tablet Take 1 tablet (500 mg total) by mouth daily. 6 tablet 0  . losartan (COZAAR) 100 MG tablet Take 1 tablet by mouth daily. 90 tablet 0  . lovastatin (MEVACOR) 40 MG tablet Take 1 tablet (40 mg total) by mouth at bedtime. 60 tablet 0  . metFORMIN (GLUCOPHAGE-XR) 500 MG 24 hr tablet Take 500 mg by mouth 2 (two) times daily.     . prochlorperazine (COMPAZINE) 10 MG tablet Take 1 tablet (10 mg total) by mouth every 6 (six) hours as needed for nausea or vomiting. (Patient not taking: No sig reported) 30 tablet 0  . vitamin B-12 (CYANOCOBALAMIN) 1000 MCG tablet Take 1 tablet (1,000 mcg total) by mouth daily. 90 tablet 0   No current facility-administered medications for this visit.   Facility-Administered Medications Ordered in Other Visits  Medication Dose Route Frequency Provider Last Rate Last Admin  . heparin lock flush 100 unit/mL  500 Units Intravenous Once Earlie Server, MD      . sodium chloride flush (NS) 0.9 % injection 10 mL   10 mL Intravenous PRN Earlie Server, MD   10 mL at 01/22/20 0831  . sodium chloride flush (NS) 0.9 % injection 10 mL  10 mL Intravenous Once Earlie Server, MD         PHYSICAL EXAMINATION: ECOG PERFORMANCE STATUS: 1 - Symptomatic but completely ambulatory Vitals:   08/05/20 0856  BP: 112/74  Pulse: 79  Resp: 18  Temp: (!) 96.8 F (36 C)   Filed Weights   08/05/20 0856  Weight: 242 lb 14.4 oz (110.2 kg)    Physical Exam Constitutional:      General: He is not in acute distress.    Appearance: He is obese.  HENT:     Head: Normocephalic and atraumatic.  Eyes:     General: No scleral icterus. Cardiovascular:     Rate and Rhythm: Normal rate and regular rhythm.     Heart sounds: Normal heart sounds.  Pulmonary:     Effort: Pulmonary effort is normal. No respiratory distress.     Breath sounds: No wheezing.  Abdominal:     General: Bowel sounds are normal. There is no distension.     Palpations: Abdomen is soft.  Musculoskeletal:        General: No deformity. Normal range of motion.     Cervical back: Normal range of motion and neck supple.  Skin:    General: Skin is warm and dry.     Findings: No erythema or rash.  Neurological:     Mental Status: He is alert and oriented to person, place, and time. Mental status is at baseline.     Cranial Nerves: No cranial nerve deficit.     Coordination: Coordination normal.  Psychiatric:        Mood and Affect: Mood normal.     LABORATORY DATA:  I have reviewed the data as listed Lab Results  Component Value Date   WBC 2.4 (L) 07/22/2020   HGB 9.4 (L) 07/22/2020   HCT 27.3 (L) 07/22/2020   MCV 88.3 07/22/2020   PLT 77 (L) 07/22/2020   Recent Labs    04/01/20 0837 04/15/20 0853 04/29/20 0849 05/21/20 1253 06/18/20 0849 07/08/20 0838 07/22/20 0921  NA 139 135 138   < > 136 133* 133*  K 4.3 4.3 4.2   < > 4.4 4.4 4.0  CL 107 104 108   < > 106 103 105  CO2 21* 21* 23   < > 21* 21* 19*  GLUCOSE 123* 162* 109*   < > 160*  179* 149*  BUN 24* 22* 20   < > 19 26* 27*  CREATININE 1.69* 1.87* 1.58*   < > 1.54* 1.60* 1.52*  CALCIUM 8.5* 8.6* 8.7*   < > 8.8* 8.9 9.0  GFRNONAA 44* 39* 48*   < > 52* 50* 53*  GFRAA 51* 45* 55*  --   --   --   --   PROT 7.9 7.8 8.0   < > 7.6 7.5 7.5  ALBUMIN 4.0 4.0 4.3   < > 3.5 3.8 3.8  AST 31 34 29   < > 34 35 31  ALT $Re'24 27 21   'zUV$ < > $R'22 30 26  'Se$ ALKPHOS 102 117 108   < > 129* 125 121  BILITOT 0.9 0.8 1.5*   < > 0.7 0.9 1.1   < > = values in this interval not displayed.   Iron/TIBC/Ferritin/ %Sat    Component Value Date/Time   IRON 52 12/18/2019 0826   IRON 105 10/15/2018 1325   TIBC 284 12/18/2019 0826   TIBC 244 (L) 10/15/2018 1325   FERRITIN 146 12/18/2019 0826   FERRITIN 588 (H) 10/15/2018 1325   IRONPCTSAT 18 12/18/2019 0826   IRONPCTSAT 43 10/15/2018 1325      RADIOGRAPHIC STUDIES: I have personally reviewed the radiological images as listed and agreed with the findings in the report. CT CHEST ABDOMEN PELVIS WO CONTRAST  Result Date: 07/19/2020 CLINICAL DATA:  Restaging metastatic renal cell carcinoma post left nephrectomy. No current complaints. EXAM: CT CHEST, ABDOMEN AND PELVIS WITHOUT CONTRAST TECHNIQUE: Multidetector CT imaging of the chest, abdomen and pelvis was performed following the standard protocol without IV contrast. COMPARISON:  Previous CT 02/13/2020 FINDINGS: CT CHEST FINDINGS Cardiovascular: Right IJ Port-A-Cath extends to the level of the superior right atrium. There is diffuse atherosclerosis of the aorta, great vessels and coronary arteries. No acute vascular findings on noncontrast imaging. The heart size is normal. There is no pericardial effusion. Mediastinum/Nodes: Stable prominent left internal mammary lymph nodes measuring up to 5 mm on image 25/2. Small mediastinal lymph nodes are unchanged. There are no enlarged mediastinal, hilar or axillary lymph nodes. Hilar assessment is limited by the lack of intravenous contrast, although the hilar  contours appear unchanged. The thyroid gland, trachea and esophagus demonstrate no significant findings. Lungs/Pleura: There is no pleural effusion. There are new patchy ground-glass opacities throughout both lungs. Some of the subpleural components have central part solid/airspace components. There is a new solid right perihilar nodule measuring 9 x 6 mm on image 82/3 which may reflect a lymph node. No other solid pulmonary nodules identified. Musculoskeletal/Chest wall: No chest wall mass or suspicious osseous findings. Bilateral gynecomastia and previous lower cervical fusion noted. CT ABDOMEN AND PELVIS FINDINGS Hepatobiliary: The liver appears grossly stable as imaged in the noncontrast state with diffuse contour irregularity and relative enlargement of the left and caudate lobes, consistent with cirrhosis. No focal lesions identified. No evidence of gallstones, gallbladder wall thickening or biliary dilatation. Pancreas: Unremarkable. No pancreatic ductal dilatation or surrounding inflammatory changes. Spleen: Stable moderate splenomegaly. Adrenals/Urinary Tract: Both adrenal glands appear normal. Previous left nephrectomy without mass in the nephrectomy bed. The right kidney appears unremarkable as imaged in the noncontrast state.  The bladder appears unremarkable for its degree of distention. Stomach/Bowel: No evidence of bowel wall thickening, distention or surrounding inflammatory change. Incomplete distension of the cecum. Vascular/Lymphatic: Scattered small lymph nodes in the gastrohepatic ligament and upper retroperitoneum are stable, likely related to the patient's cirrhosis. No progressive adenopathy identified. Diffuse aortic and branch vessel atherosclerosis again noted. Reproductive: The prostate gland and seminal vesicles appear unremarkable. Penile prosthesis and reservoir are again noted. Other: Abdominal ascites has mildly increased in volume. There is extension of ascites through a small left  periumbilical hernia (image 482/7). No herniated bowel or peritoneal nodularity identified. Grossly stable edema throughout the mesenteric fat. Musculoskeletal: No acute or significant osseous findings. IMPRESSION: 1. New patchy ground-glass opacities throughout both lungs with central part solid/airspace components. These findings are nonspecific and could be secondary to an atypical infection (including viral pneumonia) or inflammation (including drug reaction). 2. New solid right perihilar nodule, possibly a reactive lymph node. Recommend attention on follow-up. 3. No specific evidence of metastatic disease in the chest, abdomen or pelvis. Stable small left internal mammary and upper abdominal lymph nodes. 4. Cirrhosis and portal hypertension. Mildly increased ascites with extension of ascites through a small left periumbilical hernia. 5. Aortic Atherosclerosis (ICD10-I70.0). 6. These results will be called to the ordering clinician or representative by the Radiologist Assistant, and communication documented in the PACS or [employer]. Electronically Signed   By: Richardean Sale M.D.   On: 07/19/2020 08:39     ASSESSMENT & PLAN:  1. Renal cell carcinoma of left kidney (HCC)   2. Encounter for antineoplastic immunotherapy   3. Thrombocytopenia ([place])   4. Chronic kidney disease, stage 3b (HCC)   5. 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 Proceed with nivolumab today.   #Chronic thrombocytopenia and neutropenia, secondary to cirrhosis.  Counts are stable.  #Chronic kidney disease, creatinine is slightly worse.  Encourage oral hydration.  #Anemia, chronic, hemoglobin has improved slightly.  Follow-up 2 week lab MD for assessment evaluation prior to nivolumab. All questions were answered. The patient knows to call the clinic with any problems questions or concerns.   Earlie Server, MD, PhD Hematology  Oncology [facility] at [facility] Pager- [phone] 08/05/2020

## 2020-08-05 NOTE — Progress Notes (Signed)
Pt here for follow up. No new concerns voiced.   

## 2020-08-18 ENCOUNTER — Other Ambulatory Visit: Payer: Self-pay

## 2020-08-18 DIAGNOSIS — C642 Malignant neoplasm of left kidney, except renal pelvis: Secondary | ICD-10-CM

## 2020-08-19 ENCOUNTER — Inpatient Hospital Stay (HOSPITAL_BASED_OUTPATIENT_CLINIC_OR_DEPARTMENT_OTHER): Payer: Medicare Other | Admitting: Oncology

## 2020-08-19 ENCOUNTER — Inpatient Hospital Stay: Payer: Medicare Other

## 2020-08-19 ENCOUNTER — Encounter: Payer: Self-pay | Admitting: Oncology

## 2020-08-19 VITALS — BP 139/83 | HR 80 | Temp 96.6°F | Resp 18 | Wt 243.9 lb

## 2020-08-19 DIAGNOSIS — D631 Anemia in chronic kidney disease: Secondary | ICD-10-CM

## 2020-08-19 DIAGNOSIS — C642 Malignant neoplasm of left kidney, except renal pelvis: Secondary | ICD-10-CM

## 2020-08-19 DIAGNOSIS — N1832 Chronic kidney disease, stage 3b: Secondary | ICD-10-CM

## 2020-08-19 DIAGNOSIS — D696 Thrombocytopenia, unspecified: Secondary | ICD-10-CM

## 2020-08-19 DIAGNOSIS — Z5112 Encounter for antineoplastic immunotherapy: Secondary | ICD-10-CM

## 2020-08-19 DIAGNOSIS — C649 Malignant neoplasm of unspecified kidney, except renal pelvis: Secondary | ICD-10-CM

## 2020-08-19 LAB — CBC WITH DIFFERENTIAL/PLATELET
Abs Immature Granulocytes: 0.01 10*3/uL (ref 0.00–0.07)
Basophils Absolute: 0 10*3/uL (ref 0.0–0.1)
Basophils Relative: 1 %
Eosinophils Absolute: 0.1 10*3/uL (ref 0.0–0.5)
Eosinophils Relative: 5 %
HCT: 27.9 % — ABNORMAL LOW (ref 39.0–52.0)
Hemoglobin: 9.7 g/dL — ABNORMAL LOW (ref 13.0–17.0)
Immature Granulocytes: 0 %
Lymphocytes Relative: 19 %
Lymphs Abs: 0.4 10*3/uL — ABNORMAL LOW (ref 0.7–4.0)
MCH: 31.2 pg (ref 26.0–34.0)
MCHC: 34.8 g/dL (ref 30.0–36.0)
MCV: 89.7 fL (ref 80.0–100.0)
Monocytes Absolute: 0.3 10*3/uL (ref 0.1–1.0)
Monocytes Relative: 14 %
Neutro Abs: 1.4 10*3/uL — ABNORMAL LOW (ref 1.7–7.7)
Neutrophils Relative %: 61 %
Platelets: 66 10*3/uL — ABNORMAL LOW (ref 150–400)
RBC: 3.11 MIL/uL — ABNORMAL LOW (ref 4.22–5.81)
RDW: 14 % (ref 11.5–15.5)
WBC: 2.3 10*3/uL — ABNORMAL LOW (ref 4.0–10.5)
nRBC: 0 % (ref 0.0–0.2)

## 2020-08-19 LAB — COMPREHENSIVE METABOLIC PANEL
ALT: 22 U/L (ref 0–44)
AST: 33 U/L (ref 15–41)
Albumin: 3.7 g/dL (ref 3.5–5.0)
Alkaline Phosphatase: 115 U/L (ref 38–126)
Anion gap: 9 (ref 5–15)
BUN: 34 mg/dL — ABNORMAL HIGH (ref 6–20)
CO2: 22 mmol/L (ref 22–32)
Calcium: 8.8 mg/dL — ABNORMAL LOW (ref 8.9–10.3)
Chloride: 103 mmol/L (ref 98–111)
Creatinine, Ser: 1.7 mg/dL — ABNORMAL HIGH (ref 0.61–1.24)
GFR, Estimated: 46 mL/min — ABNORMAL LOW (ref >60)
Glucose, Bld: 133 mg/dL — ABNORMAL HIGH (ref 70–99)
Potassium: 4.7 mmol/L (ref 3.5–5.1)
Sodium: 134 mmol/L — ABNORMAL LOW (ref 135–145)
Total Bilirubin: 0.9 mg/dL (ref 0.3–1.2)
Total Protein: 7.6 g/dL (ref 6.5–8.1)

## 2020-08-19 MED ORDER — HEPARIN SOD (PORK) LOCK FLUSH 100 UNIT/ML IV SOLN
500.0000 [IU] | Freq: Once | INTRAVENOUS | Status: AC
  Administered 2020-08-19: 500 [IU] via INTRAVENOUS
  Filled 2020-08-19: qty 5

## 2020-08-19 MED ORDER — SODIUM CHLORIDE 0.9% FLUSH
10.0000 mL | Freq: Once | INTRAVENOUS | Status: AC
  Administered 2020-08-19: 10 mL via INTRAVENOUS
  Filled 2020-08-19: qty 10

## 2020-08-19 MED ORDER — SODIUM CHLORIDE 0.9 % IV SOLN
240.0000 mg | Freq: Once | INTRAVENOUS | Status: AC
  Administered 2020-08-19: 240 mg via INTRAVENOUS
  Filled 2020-08-19: qty 24

## 2020-08-19 MED ORDER — SODIUM CHLORIDE 0.9 % IV SOLN
Freq: Once | INTRAVENOUS | Status: AC
  Filled 2020-08-19: qty 250

## 2020-08-19 NOTE — Progress Notes (Signed)
Patient would like to discuss possible side effects of abnormal thyroid such as hair loss and our of control glucose.

## 2020-08-19 NOTE — Progress Notes (Signed)
1129: Pt tolerated infusion well. Pt stable at discharge.

## 2020-08-19 NOTE — Progress Notes (Signed)
Hematology/Oncology follow-up [facility] Telephone:([phone] [phone]   Patient Care Team: Henry Billet, MD as PCP - General (Internal Medicine) Henry Server, MD as Consulting Physician (Hematology and Oncology)  REFERRING PROVIDER: Albina Billet, MD  CHIEF COMPLAINTS/REASON FOR VISIT:  Follow-up for kidney cancer treatments  HISTORY OF PRESENTING ILLNESS:   Henry Moore is a  60 y.o.  male with PMH listed below was seen in consultation at the request of  Henry Billet, MD  for evaluation of kidney cancer Extensive medical records were reviewed Patient had MRI lumbar spine without contrast done on 07/13/2018 which showed mired lumbar degenerative disease. CT thoracic spine was done on 08/05/2018 which showed 5 cm left renal mass. 09/04/2018 CT abdomen and pelvis with and without contrast showed 5 cm round enhancing solid mass in the left kidney.  No involvement of left renal vein.  No lymphadenopathy Morphologic changes consistent with cirrhosis and the portal hypertension.  Small volume ascites and splenomegaly. . 10/16/2018 Left nephrectomy showed renal cell carcinoma, clear-cell type, nuclear grade 4, tumor extends into the renal vein and renal sinus fat.  Urethral, vascular and or resection margins are negative for tumor pT3a Nx Mx  01/28/2019 patient had another CT chest abdomen pelvis done with contrast Showed interval left nephrectomy.  Scattered tiny pulmonary nodules bilaterally.  Nonspecific.  No other evidence of metastatic disease.  Cirrhosis changes extensive coronary and aortic atherosclerosis  07/14/2019 patient underwent surveillance CT chest abdomen pelvis without contrast Interval progression of pulmonary metastasis with new enlarged right paratracheal lymph node 1.7 cm, previously 0.6 cm one-point since worrisome for metastatic adenopathy.  Multiple progressive pulmonary nodules, index nodule within the lingula measures 1.3 cm, previously  3 mm. Index subpleural nodule within the anterior right upper lobe measuring 1.8 cm, previously 3 cm Index nodule within the post lateral right lower lobe measuring 5 mm, new from previous care Aortic sclerosis.  Three-vessel coronary artery calcification noted.  Cirrhosis changes.  Splenomegaly.  Patient was referred to establish care with medical oncology for further discussion and evaluation of metastatic renal cell carcinoma. Patient reports feeling well at baseline today.  Denies any shortness of breath, cough, hemoptysis, back pain, headache. He quitted smoking 2015.  Not currently actively drinking alcohol as well. Patient has chronic back pain unchanged.  # Liver cirrhosis with small amount of ascites/splenomegaly patient sees gastroenterology Henry Moore.  . He will get ultrasound surveillance due in February 2021 for screening of Henry Moore.  EGD surveillance for Barrett's plan in April 2021.  # Diabetes, on Humalog sliding scale and metformin.  #Family history of cancer and personal history of RCC.  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  Status post ipilimumab and nivolumab treatment.  # 10/29/2019 CT chest abdomen pelvis without contrast images were independently reviewed by me and discussed with patient.   CT showed marked improvement with resolution of many of the pulmonary nodules, prominent reduced size of other nodules.  Considerably reduced size of the right lower paratracheal lymph node now 0.9 cm in diameter. Hepatic cirrhosis with prominent splenomegaly and trace perisplenic ascites. Chronic CT findings includes aortic atherosclerotic vascular disease.  Mild sigmoid colon diverticulosis  # CKD, he establish care with Dr. Candiss Moore for chronic  kidney disease.  Blood pressure medication has been adjusted.  Hydrochlorothiazide decreased to 12.5 mg daily.  # NGS -foundation one PD-L1 TPS 0% no reportable mutation, MS stable. TMB 8 muts/mb VHL [id number]*16, SETD2 BAP Genetic testing is negative.   # 07/16/2020, CT chest abdomen pelvis images were reviewed and discussed with patient Compared to July 2021 image, he has had a new patchy groundglass opacities throughout both lungs with central part of solids/airspace components.  Findings are nonspecific and could be secondary to an atypical infection, including viral pneumonia or inflammation.  Patient is asymptomatic.  I wonder the changes are secondary to his Covid infection.  Patient has been started on Levaquin to cover atypical infection by on-call physician and I recommend him to continue and complete the course. Proceed with today's immunotherapy nivolumab.  Advised patient to notify me if his symptoms get worse.  INTERVAL HISTORY Henry SCHUTTERis a 60y.o. male who has above history reviewed by me today presents for follow up visit for management of stage IV RCC  Problems and complaints are listed below: Patient reports that recently blood glucose has been higher than his baseline level and he has to use additional insulin coverage.  Reports being quite compliant with diabetic diet. No fever, chills, nausea vomiting diarrhea .  Some hair loss    Review of Systems  Constitutional: Positive for fatigue. Negative for appetite change, chills, fever and unexpected weight change.  HENT:   Negative for hearing loss and voice change.   Eyes: Negative for eye problems and icterus.  Respiratory: Negative for chest tightness, cough and shortness of breath.   Cardiovascular: Negative for chest pain and leg swelling.  Gastrointestinal: Negative for abdominal distention and abdominal pain.  Endocrine: Negative for hot flashes.  Genitourinary: Negative for difficulty urinating, dysuria and  frequency.   Musculoskeletal: Negative for arthralgias.       Chronic back pain  Skin: Negative for itching and rash.       Hair loss  Neurological: Negative for light-headedness and numbness.  Hematological: Negative for adenopathy. Does not bruise/bleed easily.  Psychiatric/Behavioral: Negative for confusion.    MEDICAL HISTORY:  Past Medical History:  Diagnosis Date  . Cough    SINUS DRAINAGE CAUSING COUGHING , REPORTS CLEAR MUCOUS   . Diabetes mellitus without complication ([place]   . Family history of breast cancer   . Family history of colon cancer   . Family history of stomach cancer   . HTN (hypertension)   . Hyperlipemia   . Left renal mass   . Platelets decreased ([place]    DENIES UNSUAL BLEEDING   . PONV (postoperative nausea and vomiting)   . Renal cell carcinoma ([place] 08/04/2019  . WBC decreased     SURGICAL HISTORY: Past Surgical History:  Procedure Laterality Date  . ANKLE SURGERY Left   . ANTERIOR CERVICAL DECOMP/DISCECTOMY FUSION N/A 04/16/2014   Procedure: ANTERIOR CERVICAL DECOMPRESSION/DISCECTOMY FUSION  (ACDF C5-C7)   (2 LEVELS)     ;  Surgeon: DMelina Schools MD;  [place] [facility]  Service: Orthopedics;  Laterality: N/A;  . APPENDECTOMY    . BACK SURGERY    . ESOPHAGOGASTRODUODENOSCOPY (EGD) WITH PROPOFOL N/A 02/06/2019   Procedure: ESOPHAGOGASTRODUODENOSCOPY (EGD) WITH PROPOFOL;  Surgeon: AJonathon Bellows MD;  [facility]  Service: Gastroenterology;  Laterality: N/A;  . ESOPHAGOGASTRODUODENOSCOPY (EGD) WITH PROPOFOL N/A 03/04/2019   Procedure: ESOPHAGOGASTRODUODENOSCOPY (EGD) WITH PROPOFOL;  Surgeon: VLin Landsman MD;  [facility];  Service: Gastroenterology;  Laterality: N/A;  .  ESOPHAGOGASTRODUODENOSCOPY (EGD) WITH PROPOFOL N/A 04/22/2019   Procedure: ESOPHAGOGASTRODUODENOSCOPY (EGD) WITH PROPOFOL;  Surgeon: [name], MD;  [facility];  Service: Gastroenterology;  Laterality: N/A;  . FRACTURE SURGERY    . HYDROCELE  EXCISION    . KNEE ARTHROSCOPY Bilateral   . LAPAROSCOPIC NEPHRECTOMY, HAND ASSISTED Left 10/16/2018   Procedure: LEFT HAND ASSISTED LAPAROSCOPIC NEPHRECTOMY;  Surgeon: Lucas Mallow, MD;  Location: [facility];  Service: Urology;  Laterality: Left;  . NASAL SINUS SURGERY     X 2  . 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 PORTA CATH INSERTION N/A 11/11/2019   Procedure: PORTA CATH INSERTION;  Surgeon: Katha Cabal, MD;  Location: [place] [facility];  Service: Cardiovascular;  Laterality: N/A;  . SHOULDER SURGERY Left     SOCIAL HISTORY: Social History   Socioeconomic History  . Marital status: Married    Spouse name: Not on file  . Number of children: Not on file  . Years of education: Not on file  . Highest education level: Not on file  Occupational History  . Not on file  Tobacco Use  . Smoking status: Former Smoker    Packs/day: 0.25    Years: 20.00    Pack years: 5.00    Quit date: 2015    Years since quitting: 7.0  . Smokeless tobacco: Never Used  Vaping Use  . Vaping Use: Never used  Substance and Sexual Activity  . Alcohol use: Not Currently    Alcohol/week: 0.0 standard drinks    Comment: no alchol in 1 year  . Drug use: No  . Sexual activity: Not on file  Other Topics Concern  . Not on file  Social History Narrative  . Not on file   Social Determinants of Health   Financial Resource Strain: Not on file  Food Insecurity: Not on file  Transportation Needs: Not on file  Physical Activity: Not on file  Stress: Not on file  Social Connections: Not on file  Intimate Partner Violence: Not on file    FAMILY HISTORY: Family History  Problem Relation Age of Onset  . Diabetes Mother   . Cancer Mother        neuroendocrine cancer  . Cancer Father        neuroendocrine cancer of meninges  . Cancer Maternal Grandmother        tomach dx 56  . Alzheimer's disease Paternal Grandmother   . Lung cancer Paternal Grandfather   . Lung cancer Maternal  Aunt   . Colon cancer Maternal Uncle   . Breast cancer Paternal Aunt        both dx 17s  . Ovarian cancer Other        dx 44  . Breast cancer Cousin     ALLERGIES:  is allergic to codeine and mucinex [guaifenesin er].  MEDICATIONS:  Current Outpatient Medications  Medication Sig Dispense Refill  . ACCU-CHEK AVIVA PLUS test strip CHECK BL00D SUGAR ONCE OR TWICE DAILY. 100 each PRN  . amLODipine (NORVASC) 10 MG tablet Take 10 mg by mouth daily.    . carvedilol (COREG) 25 MG tablet Take 1 tablet (25 mg total) by mouth 2 (two) times daily with a meal. 180 tablet 3  . Cholecalciferol (VITAMIN D3) 125 MCG (5000 UT) CAPS Take 5,000 Units by mouth daily.    Marland Kitchen glipiZIDE (GLUCOTROL) 5 MG tablet Take 1 tablet (5 mg total) by mouth daily before breakfast. 90 tablet 3  .  hydrochlorothiazide (HYDRODIURIL) 25 MG tablet TAKE 1 TABLET DAILY. (Patient taking differently: Take 12.5 mg by mouth daily.) 90 tablet 0  . losartan (COZAAR) 100 MG tablet Take 1 tablet by mouth daily. 90 tablet 0  . lovastatin (MEVACOR) 40 MG tablet Take 1 tablet (40 mg total) by mouth at bedtime. 60 tablet 0  . metFORMIN (GLUCOPHAGE-XR) 500 MG 24 hr tablet Take 500 mg by mouth 2 (two) times daily.     . vitamin B-12 (CYANOCOBALAMIN) 1000 MCG tablet Take 1 tablet (1,000 mcg total) by mouth daily. 90 tablet 0  . insulin lispro (INSULIN LISPRO) 100 UNIT/ML KwikPen Junior Inject into the skin 3 (three) times daily as needed (Above 250). Sliding scale (Patient not taking: No sig reported)    . levofloxacin (LEVAQUIN) 500 MG tablet Take 1 tablet (500 mg total) by mouth daily. (Patient not taking: No sig reported) 6 tablet 0  . prochlorperazine (COMPAZINE) 10 MG tablet Take 1 tablet (10 mg total) by mouth every 6 (six) hours as needed for nausea or vomiting. (Patient not taking: No sig reported) 30 tablet 0   No current facility-administered medications for this visit.   Facility-Administered Medications Ordered in Other Visits   Medication Dose Route Frequency Provider Last Rate Last Admin  . sodium chloride flush (NS) 0.9 % injection 10 mL  10 mL Intravenous PRN Henry Server, MD   10 mL at 01/22/20 0831     PHYSICAL EXAMINATION: ECOG PERFORMANCE STATUS: 1 - Symptomatic but completely ambulatory Vitals:   08/19/20 0905  BP: 139/83  Pulse: 80  Resp: 18  Temp: (!) 96.6 F (35.9 C)   Filed Weights   08/19/20 0905  Weight: 243 lb 14.4 oz (110.6 kg)    Physical Exam Constitutional:      General: He is not in acute distress.    Appearance: He is obese.  HENT:     Head: Normocephalic and atraumatic.  Eyes:     General: No scleral icterus. Cardiovascular:     Rate and Rhythm: Normal rate and regular rhythm.     Heart sounds: Normal heart sounds.  Pulmonary:     Effort: Pulmonary effort is normal. No respiratory distress.     Breath sounds: No wheezing.  Abdominal:     General: Bowel sounds are normal. There is no distension.     Palpations: Abdomen is soft.  Musculoskeletal:        General: No deformity. Normal range of motion.     Cervical back: Normal range of motion and neck supple.  Skin:    General: Skin is warm and dry.     Findings: No erythema or rash.  Neurological:     Mental Status: He is alert and oriented to person, place, and time. Mental status is at baseline.     Cranial Nerves: No cranial nerve deficit.     Coordination: Coordination normal.  Psychiatric:        Mood and Affect: Mood normal.     LABORATORY DATA:  I have reviewed the data as listed Lab Results  Component Value Date   WBC 2.3 (L) 08/19/2020   HGB 9.7 (L) 08/19/2020   HCT 27.9 (L) 08/19/2020   MCV 89.7 08/19/2020   PLT 66 (L) 08/19/2020   Recent Labs    04/01/20 0837 04/15/20 0853 04/29/20 0849 05/21/20 1253 07/22/20 0921 08/05/20 0831 08/19/20 0843  NA 139 135 138   < > 133* 133* 134*  K 4.3 4.3 4.2   < >  4.0 4.1 4.7  CL 107 104 108   < > 105 103 103  CO2 21* 21* 23   < > 19* 21* 22  GLUCOSE  123* 162* 109*   < > 149* 204* 133*  BUN 24* 22* 20   < > 27* 23* 34*  CREATININE 1.69* 1.87* 1.58*   < > 1.52* 1.67* 1.70*  CALCIUM 8.5* 8.6* 8.7*   < > 9.0 8.9 8.8*  GFRNONAA 44* 39* 48*   < > 53* 47* 46*  GFRAA 51* 45* 55*  --   --   --   --   PROT 7.9 7.8 8.0   < > 7.5 7.9 7.6  ALBUMIN 4.0 4.0 4.3   < > 3.8 3.8 3.7  AST 31 34 29   < > 31 28 33  ALT _0 < > _1 ALKPHOS 102 117 108   < > 121 130* 115  BILITOT 0.9 0.8 1.5*   < > 1.1 1.0 0.9   < > = values in this interval not displayed.   Iron/TIBC/Ferritin/ %Sat    Component Value Date/Time   IRON 52 12/18/2019 0826   IRON 105 10/15/2018 1325   TIBC 284 12/18/2019 0826   TIBC 244 (L) 10/15/2018 1325   FERRITIN 146 12/18/2019 0826   FERRITIN 588 (H) 10/15/2018 1325   IRONPCTSAT 18 12/18/2019 0826   IRONPCTSAT 43 10/15/2018 1325      RADIOGRAPHIC STUDIES: I have personally reviewed the radiological images as listed and agreed with the findings in the report. CT CHEST ABDOMEN PELVIS WO CONTRAST  Result Date: 07/19/2020 CLINICAL DATA:  Restaging metastatic renal cell carcinoma post left nephrectomy. No current complaints. EXAM: CT CHEST, ABDOMEN AND PELVIS WITHOUT CONTRAST TECHNIQUE: Multidetector CT imaging of the chest, abdomen and pelvis was performed following the standard protocol without IV contrast. COMPARISON:  Previous CT 02/13/2020 FINDINGS: CT CHEST FINDINGS Cardiovascular: Right IJ Port-A-Cath extends to the level of the superior right atrium. There is diffuse atherosclerosis of the aorta, great vessels and coronary arteries. No acute vascular findings on noncontrast imaging. The heart size is normal. There is no pericardial effusion. Mediastinum/Nodes: Stable prominent left internal mammary lymph nodes measuring up to 5 mm on image 25/2. Small mediastinal lymph nodes are unchanged. There are no enlarged mediastinal, hilar or axillary lymph nodes. Hilar assessment is limited by the lack of intravenous contrast,  although the hilar contours appear unchanged. The thyroid gland, trachea and esophagus demonstrate no significant findings. Lungs/Pleura: There is no pleural effusion. There are new patchy ground-glass opacities throughout both lungs. Some of the subpleural components have central part solid/airspace components. There is a new solid right perihilar nodule measuring 9 x 6 mm on image 82/3 which may reflect a lymph node. No other solid pulmonary nodules identified. Musculoskeletal/Chest wall: No chest wall mass or suspicious osseous findings. Bilateral gynecomastia and previous lower cervical fusion noted. CT ABDOMEN AND PELVIS FINDINGS Hepatobiliary: The liver appears grossly stable as imaged in the noncontrast state with diffuse contour irregularity and relative enlargement of the left and caudate lobes, consistent with cirrhosis. No focal lesions identified. No evidence of gallstones, gallbladder wall thickening or biliary dilatation. Pancreas: Unremarkable. No pancreatic ductal dilatation or surrounding inflammatory changes. Spleen: Stable moderate splenomegaly. Adrenals/Urinary Tract: Both adrenal glands appear normal. Previous left nephrectomy without mass in the nephrectomy bed. The right kidney appears unremarkable as imaged in the noncontrast state. The bladder appears unremarkable for its degree of  distention. Stomach/Bowel: No evidence of bowel wall thickening, distention or surrounding inflammatory change. Incomplete distension of the cecum. Vascular/Lymphatic: Scattered small lymph nodes in the gastrohepatic ligament and upper retroperitoneum are stable, likely related to the patient's cirrhosis. No progressive adenopathy identified. Diffuse aortic and branch vessel atherosclerosis again noted. Reproductive: The prostate gland and seminal vesicles appear unremarkable. Penile prosthesis and reservoir are again noted. Other: Abdominal ascites has mildly increased in volume. There is extension of ascites  through a small left periumbilical hernia (image 569/4). No herniated bowel or peritoneal nodularity identified. Grossly stable edema throughout the mesenteric fat. Musculoskeletal: No acute or significant osseous findings. IMPRESSION: 1. New patchy ground-glass opacities throughout both lungs with central part solid/airspace components. These findings are nonspecific and could be secondary to an atypical infection (including viral pneumonia) or inflammation (including drug reaction). 2. New solid right perihilar nodule, possibly a reactive lymph node. Recommend attention on follow-up. 3. No specific evidence of metastatic disease in the chest, abdomen or pelvis. Stable small left internal mammary and upper abdominal lymph nodes. 4. Cirrhosis and portal hypertension. Mildly increased ascites with extension of ascites through a small left periumbilical hernia. 5. Aortic Atherosclerosis (ICD10-I70.0). 6. These results will be called to the ordering clinician or representative by the Radiologist Assistant, and communication documented in the PACS or [employer]. Electronically Signed   By: Richardean Sale M.D.   On: 07/19/2020 08:39     ASSESSMENT & PLAN:  1. Renal cell carcinoma of left kidney (HCC)   2. Encounter for antineoplastic immunotherapy   3. Thrombocytopenia ([place])   4. Chronic kidney disease, stage 3b ([place])   5. 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 TSH has been monitored.  Slightly elevated.  Normal free T4.  Discussed with patient that we will continue monitor the thyroid function.  #Chronic thrombocytopenia and neutropenia,  due to liver cirrhosis  counts are stable  #Chronic kidney disease, creatinine is getting worse.  He has upcoming nephrology appointment.  #Anemia, chronic, hemoglobin is 9.7, close to his baseline.  Continue  to monitor.  I will check his iron level at the next visit.  #Diabetes, follow-up with primary care provider.  Recommend him to have A1c checked. Follow-up 2 week lab MD for assessment evaluation prior to nivolumab. All questions were answered. The patient knows to call the clinic with any problems questions or concerns.   Henry Server, MD, PhD Hematology Oncology [facility] at [facility] Pager- [phone] 08/19/2020

## 2020-08-31 ENCOUNTER — Inpatient Hospital Stay: Payer: Medicare Other | Attending: Hospice and Palliative Medicine | Admitting: Hospice and Palliative Medicine

## 2020-08-31 DIAGNOSIS — Z803 Family history of malignant neoplasm of breast: Secondary | ICD-10-CM | POA: Insufficient documentation

## 2020-08-31 DIAGNOSIS — C642 Malignant neoplasm of left kidney, except renal pelvis: Secondary | ICD-10-CM | POA: Diagnosis not present

## 2020-08-31 DIAGNOSIS — D696 Thrombocytopenia, unspecified: Secondary | ICD-10-CM | POA: Insufficient documentation

## 2020-08-31 DIAGNOSIS — D631 Anemia in chronic kidney disease: Secondary | ICD-10-CM | POA: Insufficient documentation

## 2020-08-31 DIAGNOSIS — Z87891 Personal history of nicotine dependence: Secondary | ICD-10-CM | POA: Insufficient documentation

## 2020-08-31 DIAGNOSIS — Z515 Encounter for palliative care: Secondary | ICD-10-CM

## 2020-08-31 DIAGNOSIS — C7951 Secondary malignant neoplasm of bone: Secondary | ICD-10-CM | POA: Insufficient documentation

## 2020-08-31 DIAGNOSIS — K7460 Unspecified cirrhosis of liver: Secondary | ICD-10-CM | POA: Insufficient documentation

## 2020-08-31 DIAGNOSIS — E119 Type 2 diabetes mellitus without complications: Secondary | ICD-10-CM | POA: Insufficient documentation

## 2020-08-31 DIAGNOSIS — C7800 Secondary malignant neoplasm of unspecified lung: Secondary | ICD-10-CM | POA: Insufficient documentation

## 2020-08-31 DIAGNOSIS — E785 Hyperlipidemia, unspecified: Secondary | ICD-10-CM | POA: Insufficient documentation

## 2020-08-31 DIAGNOSIS — I129 Hypertensive chronic kidney disease with stage 1 through stage 4 chronic kidney disease, or unspecified chronic kidney disease: Secondary | ICD-10-CM | POA: Insufficient documentation

## 2020-08-31 DIAGNOSIS — R161 Splenomegaly, not elsewhere classified: Secondary | ICD-10-CM | POA: Insufficient documentation

## 2020-08-31 DIAGNOSIS — Z801 Family history of malignant neoplasm of trachea, bronchus and lung: Secondary | ICD-10-CM | POA: Insufficient documentation

## 2020-08-31 DIAGNOSIS — I2510 Atherosclerotic heart disease of native coronary artery without angina pectoris: Secondary | ICD-10-CM | POA: Insufficient documentation

## 2020-08-31 DIAGNOSIS — Z794 Long term (current) use of insulin: Secondary | ICD-10-CM | POA: Insufficient documentation

## 2020-08-31 DIAGNOSIS — D709 Neutropenia, unspecified: Secondary | ICD-10-CM | POA: Insufficient documentation

## 2020-08-31 DIAGNOSIS — N1832 Chronic kidney disease, stage 3b: Secondary | ICD-10-CM | POA: Insufficient documentation

## 2020-08-31 DIAGNOSIS — Z800 Family history of malignant neoplasm of digestive organs: Secondary | ICD-10-CM | POA: Insufficient documentation

## 2020-08-31 DIAGNOSIS — Z79899 Other long term (current) drug therapy: Secondary | ICD-10-CM | POA: Insufficient documentation

## 2020-08-31 DIAGNOSIS — Z5112 Encounter for antineoplastic immunotherapy: Secondary | ICD-10-CM | POA: Insufficient documentation

## 2020-08-31 NOTE — Progress Notes (Signed)
Virtual Visit via telephone note  I connected with Henry Moore on 08/31/20 at 11:30 AM EST by telephone and verified that I am speaking with the correct person using two identifiers.   I discussed the limitations, risks, security and privacy concerns of performing an evaluation and management service by telephone and the availability of in person appointments. I also discussed with the patient that there may be a patient responsible charge related to this service. The patient expressed understanding and agreed to proceed.   History of Present Illness: Henry Moore is a 60 y.o. 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    CT 10/29/2019 revealed marked improvement in tumor burden.  CT 02/13/2020 are without findings of active malignancy.  Patient was referred to palliative care to help address goals and manage ongoing symptoms.   Observations/Objective: I called and spoke with patient by phone.   Patient denies any significant changes or concerns related to the cancer.  He has had recent swings in blood sugar ranging from 500 to low of 40.  He has scheduled appointment with PCP to discuss glycemic control.  Patient also reports that he had some joint pain following a COVID vaccination.  He is scheduled for second vaccination this afternoon.  I suggested that he could try scheduled acetaminophen for 24 hours to see if that helps control aches.  Assessment and Plan: RCC -on treatment with nivolumab.  Followed by Dr. Tasia Catchings.    Follow Up Instructions: Virtual visit in 3 months   I discussed the assessment and treatment plan with the patient. The patient was provided an opportunity to ask questions and all were answered. The patient agreed with the plan and demonstrated an understanding of the instructions.   The patient was advised to call back or seek an in-person evaluation if  the symptoms worsen or if the condition fails to improve as anticipated.  I provided 10 minutes of non-face-to-face time during this encounter.   Irean Hong, NP

## 2020-09-02 ENCOUNTER — Inpatient Hospital Stay (HOSPITAL_BASED_OUTPATIENT_CLINIC_OR_DEPARTMENT_OTHER): Payer: Medicare Other | Admitting: Oncology

## 2020-09-02 ENCOUNTER — Inpatient Hospital Stay: Payer: Medicare Other

## 2020-09-02 ENCOUNTER — Encounter: Payer: Self-pay | Admitting: Oncology

## 2020-09-02 VITALS — BP 110/64 | HR 70 | Temp 97.0°F | Resp 18 | Wt 246.8 lb

## 2020-09-02 DIAGNOSIS — C642 Malignant neoplasm of left kidney, except renal pelvis: Secondary | ICD-10-CM

## 2020-09-02 DIAGNOSIS — E119 Type 2 diabetes mellitus without complications: Secondary | ICD-10-CM | POA: Diagnosis not present

## 2020-09-02 DIAGNOSIS — D631 Anemia in chronic kidney disease: Secondary | ICD-10-CM | POA: Diagnosis not present

## 2020-09-02 DIAGNOSIS — N1832 Chronic kidney disease, stage 3b: Secondary | ICD-10-CM | POA: Diagnosis not present

## 2020-09-02 DIAGNOSIS — Z87891 Personal history of nicotine dependence: Secondary | ICD-10-CM | POA: Diagnosis not present

## 2020-09-02 DIAGNOSIS — Z5112 Encounter for antineoplastic immunotherapy: Secondary | ICD-10-CM

## 2020-09-02 DIAGNOSIS — C7951 Secondary malignant neoplasm of bone: Secondary | ICD-10-CM | POA: Diagnosis not present

## 2020-09-02 DIAGNOSIS — Z800 Family history of malignant neoplasm of digestive organs: Secondary | ICD-10-CM | POA: Diagnosis not present

## 2020-09-02 DIAGNOSIS — D696 Thrombocytopenia, unspecified: Secondary | ICD-10-CM

## 2020-09-02 DIAGNOSIS — Z803 Family history of malignant neoplasm of breast: Secondary | ICD-10-CM | POA: Diagnosis not present

## 2020-09-02 DIAGNOSIS — D709 Neutropenia, unspecified: Secondary | ICD-10-CM | POA: Diagnosis not present

## 2020-09-02 DIAGNOSIS — C649 Malignant neoplasm of unspecified kidney, except renal pelvis: Secondary | ICD-10-CM

## 2020-09-02 DIAGNOSIS — Z801 Family history of malignant neoplasm of trachea, bronchus and lung: Secondary | ICD-10-CM | POA: Diagnosis not present

## 2020-09-02 DIAGNOSIS — E785 Hyperlipidemia, unspecified: Secondary | ICD-10-CM | POA: Diagnosis not present

## 2020-09-02 DIAGNOSIS — Z794 Long term (current) use of insulin: Secondary | ICD-10-CM | POA: Diagnosis not present

## 2020-09-02 DIAGNOSIS — I2510 Atherosclerotic heart disease of native coronary artery without angina pectoris: Secondary | ICD-10-CM | POA: Diagnosis not present

## 2020-09-02 DIAGNOSIS — D708 Other neutropenia: Secondary | ICD-10-CM

## 2020-09-02 DIAGNOSIS — Z79899 Other long term (current) drug therapy: Secondary | ICD-10-CM | POA: Diagnosis not present

## 2020-09-02 DIAGNOSIS — C7800 Secondary malignant neoplasm of unspecified lung: Secondary | ICD-10-CM | POA: Diagnosis not present

## 2020-09-02 DIAGNOSIS — K7460 Unspecified cirrhosis of liver: Secondary | ICD-10-CM | POA: Diagnosis not present

## 2020-09-02 DIAGNOSIS — R161 Splenomegaly, not elsewhere classified: Secondary | ICD-10-CM | POA: Diagnosis not present

## 2020-09-02 DIAGNOSIS — I129 Hypertensive chronic kidney disease with stage 1 through stage 4 chronic kidney disease, or unspecified chronic kidney disease: Secondary | ICD-10-CM | POA: Diagnosis not present

## 2020-09-02 LAB — CBC WITH DIFFERENTIAL/PLATELET
Abs Immature Granulocytes: 0.01 10*3/uL (ref 0.00–0.07)
Basophils Absolute: 0 10*3/uL (ref 0.0–0.1)
Basophils Relative: 1 %
Eosinophils Absolute: 0.1 10*3/uL (ref 0.0–0.5)
Eosinophils Relative: 6 %
HCT: 27.5 % — ABNORMAL LOW (ref 39.0–52.0)
Hemoglobin: 9.4 g/dL — ABNORMAL LOW (ref 13.0–17.0)
Immature Granulocytes: 0 %
Lymphocytes Relative: 19 %
Lymphs Abs: 0.4 10*3/uL — ABNORMAL LOW (ref 0.7–4.0)
MCH: 30.9 pg (ref 26.0–34.0)
MCHC: 34.2 g/dL (ref 30.0–36.0)
MCV: 90.5 fL (ref 80.0–100.0)
Monocytes Absolute: 0.4 10*3/uL (ref 0.1–1.0)
Monocytes Relative: 17 %
Neutro Abs: 1.3 10*3/uL — ABNORMAL LOW (ref 1.7–7.7)
Neutrophils Relative %: 57 %
Platelets: 76 10*3/uL — ABNORMAL LOW (ref 150–400)
RBC: 3.04 MIL/uL — ABNORMAL LOW (ref 4.22–5.81)
RDW: 13.9 % (ref 11.5–15.5)
WBC: 2.3 10*3/uL — ABNORMAL LOW (ref 4.0–10.5)
nRBC: 0 % (ref 0.0–0.2)

## 2020-09-02 LAB — COMPREHENSIVE METABOLIC PANEL
ALT: 31 U/L (ref 0–44)
AST: 42 U/L — ABNORMAL HIGH (ref 15–41)
Albumin: 3.8 g/dL (ref 3.5–5.0)
Alkaline Phosphatase: 106 U/L (ref 38–126)
Anion gap: 9 (ref 5–15)
BUN: 35 mg/dL — ABNORMAL HIGH (ref 6–20)
CO2: 19 mmol/L — ABNORMAL LOW (ref 22–32)
Calcium: 9 mg/dL (ref 8.9–10.3)
Chloride: 106 mmol/L (ref 98–111)
Creatinine, Ser: 1.79 mg/dL — ABNORMAL HIGH (ref 0.61–1.24)
GFR, Estimated: 44 mL/min — ABNORMAL LOW (ref >60)
Glucose, Bld: 100 mg/dL — ABNORMAL HIGH (ref 70–99)
Potassium: 4.9 mmol/L (ref 3.5–5.1)
Sodium: 134 mmol/L — ABNORMAL LOW (ref 135–145)
Total Bilirubin: 1 mg/dL (ref 0.3–1.2)
Total Protein: 7.8 g/dL (ref 6.5–8.1)

## 2020-09-02 LAB — FERRITIN: Ferritin: 164 ng/mL (ref 24–336)

## 2020-09-02 LAB — IRON AND TIBC
Iron: 60 ug/dL (ref 45–182)
Saturation Ratios: 21 % (ref 17.9–39.5)
TIBC: 293 ug/dL (ref 250–450)
UIBC: 233 ug/dL

## 2020-09-02 LAB — TSH: TSH: 4.634 u[IU]/mL — ABNORMAL HIGH (ref 0.350–4.500)

## 2020-09-02 MED ORDER — HEPARIN SOD (PORK) LOCK FLUSH 100 UNIT/ML IV SOLN
500.0000 [IU] | Freq: Once | INTRAVENOUS | Status: AC | PRN
  Administered 2020-09-02: 500 [IU]
  Filled 2020-09-02: qty 5

## 2020-09-02 MED ORDER — HEPARIN SOD (PORK) LOCK FLUSH 100 UNIT/ML IV SOLN
INTRAVENOUS | Status: AC
  Filled 2020-09-02: qty 5

## 2020-09-02 MED ORDER — SODIUM CHLORIDE 0.9% FLUSH
10.0000 mL | Freq: Once | INTRAVENOUS | Status: AC
  Administered 2020-09-02: 10 mL via INTRAVENOUS
  Filled 2020-09-02: qty 10

## 2020-09-02 MED ORDER — NIVOLUMAB CHEMO INJECTION 100 MG/10ML
240.0000 mg | Freq: Once | INTRAVENOUS | Status: AC
  Administered 2020-09-02: 240 mg via INTRAVENOUS
  Filled 2020-09-02: qty 24

## 2020-09-02 MED ORDER — SODIUM CHLORIDE 0.9 % IV SOLN
Freq: Once | INTRAVENOUS | Status: AC
  Filled 2020-09-02: qty 250

## 2020-09-02 MED ORDER — DICLOFENAC SODIUM 1 % EX GEL: 2.0000 g | Freq: Four times a day (QID) | CUTANEOUS | 2 refills | Status: DC

## 2020-09-02 MED ORDER — SODIUM CHLORIDE 0.9% FLUSH
10.0000 mL | INTRAVENOUS | Status: DC | PRN
  Administered 2020-09-02: 10 mL
  Filled 2020-09-02: qty 10

## 2020-09-02 NOTE — Progress Notes (Signed)
Nivolumab well tolerated. Discharged home in stable condition. 

## 2020-09-02 NOTE — Progress Notes (Signed)
Patient here for follow-up. Pt reports that he has had both Covid vaccines and has had localized swelling to knuckles shortly following vaccines.

## 2020-09-02 NOTE — Progress Notes (Signed)
Hematology/Oncology follow-up [facility] Telephone:([phone] [phone]   Patient Care Team: Albina Billet, MD as PCP - General (Internal Medicine) Earlie Server, MD as Consulting Physician (Hematology and Oncology)  REFERRING PROVIDER: Albina Billet, MD  CHIEF COMPLAINTS/REASON FOR VISIT:  Follow-up for kidney cancer treatments  HISTORY OF PRESENTING ILLNESS:   Henry Moore is a  60 y.o.  male with PMH listed below was seen in consultation at the request of  Albina Billet, MD  for evaluation of kidney cancer Extensive medical records were reviewed Patient had MRI lumbar spine without contrast done on 07/13/2018 which showed mired lumbar degenerative disease. CT thoracic spine was done on 08/05/2018 which showed 5 cm left renal mass. 09/04/2018 CT abdomen and pelvis with and without contrast showed 5 cm round enhancing solid mass in the left kidney.  No involvement of left renal vein.  No lymphadenopathy Morphologic changes consistent with cirrhosis and the portal hypertension.  Small volume ascites and splenomegaly. . 10/16/2018 Left nephrectomy showed renal cell carcinoma, clear-cell type, nuclear grade 4, tumor extends into the renal vein and renal sinus fat.  Urethral, vascular and or resection margins are negative for tumor pT3a Nx Mx  01/28/2019 patient had another CT chest abdomen pelvis done with contrast Showed interval left nephrectomy.  Scattered tiny pulmonary nodules bilaterally.  Nonspecific.  No other evidence of metastatic disease.  Cirrhosis changes extensive coronary and aortic atherosclerosis  07/14/2019 patient underwent surveillance CT chest abdomen pelvis without contrast Interval progression of pulmonary metastasis with new enlarged right paratracheal lymph node 1.7 cm, previously 0.6 cm one-point since worrisome for metastatic adenopathy.  Multiple progressive pulmonary nodules, index nodule within the lingula measures 1.3 cm, previously  3 mm. Index subpleural nodule within the anterior right upper lobe measuring 1.8 cm, previously 3 cm Index nodule within the post lateral right lower lobe measuring 5 mm, new from previous care Aortic sclerosis.  Three-vessel coronary artery calcification noted.  Cirrhosis changes.  Splenomegaly.  Patient was referred to establish care with medical oncology for further discussion and evaluation of metastatic renal cell carcinoma. Patient reports feeling well at baseline today.  Denies any shortness of breath, cough, hemoptysis, back pain, headache. He quitted smoking 2015.  Not currently actively drinking alcohol as well. Patient has chronic back pain unchanged.  # Liver cirrhosis with small amount of ascites/splenomegaly patient sees gastroenterology Dr. [name].  . He will get ultrasound surveillance due in February 2021 for screening of [place].  EGD surveillance for Barrett's plan in April 2021.  # Diabetes, on Humalog sliding scale and metformin.  #Family history of cancer and personal history of RCC.  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  Status post ipilimumab and nivolumab treatment.  # 10/29/2019 CT chest abdomen pelvis without contrast images were independently reviewed by me and discussed with patient.   CT showed marked improvement with resolution of many of the pulmonary nodules, prominent reduced size of other nodules.  Considerably reduced size of the right lower paratracheal lymph node now 0.9 cm in diameter. Hepatic cirrhosis with prominent splenomegaly and trace perisplenic ascites. Chronic CT findings includes aortic atherosclerotic vascular disease.  Mild sigmoid colon diverticulosis  # CKD, he establish care with Dr. Candiss Norse for chronic  kidney disease.  Blood pressure medication has been adjusted.  Hydrochlorothiazide decreased to 12.5 mg daily.  # NGS -foundation one PD-L1 TPS 0% no reportable mutation, MS stable. TMB 8 muts/mb VHL [id number]*16, SETD2 BAP Genetic testing is negative.   # 07/16/2020, CT chest abdomen pelvis images were reviewed and discussed with patient Compared to July 2021 image, he has had a new patchy groundglass opacities throughout both lungs with central part of solids/airspace components.  Findings are nonspecific and could be secondary to an atypical infection, including viral pneumonia or inflammation.  Patient is asymptomatic.  I wonder the changes are secondary to his Covid infection.  Patient has been started on Levaquin to cover atypical infection by on-call physician and I recommend him to continue and complete the course. Proceed with today's immunotherapy nivolumab.  Advised patient to notify me if his symptoms get worse.  INTERVAL HISTORY Henry Moore is a 60 y.o. male who has above history reviewed by me today presents for follow up visit for management of stage IV RCC  Problems and complaints are listed below: + both hand joints swelling after his COVID 19 vaccination. No other new complaints.     Review of Systems  Constitutional: Positive for fatigue. Negative for appetite change, chills, fever and unexpected weight change.  HENT:   Negative for hearing loss and voice change.   Eyes: Negative for eye problems and icterus.  Respiratory: Negative for chest tightness, cough and shortness of breath.   Cardiovascular: Negative for chest pain and leg swelling.  Gastrointestinal: Negative for abdominal distention and abdominal pain.  Endocrine: Negative for hot flashes.  Genitourinary: Negative for difficulty urinating, dysuria and frequency.   Musculoskeletal: Positive for arthralgias.       Chronic back pain  Skin: Negative for itching and rash.       Hair loss  Neurological: Negative  for light-headedness and numbness.  Hematological: Negative for adenopathy. Does not bruise/bleed easily.  Psychiatric/Behavioral: Negative for confusion.    MEDICAL HISTORY:  Past Medical History:  Diagnosis Date  . Cough    SINUS DRAINAGE CAUSING COUGHING , REPORTS CLEAR MUCOUS   . Diabetes mellitus without complication ([place])   . Family history of breast cancer   . Family history of colon cancer   . Family history of stomach cancer   . HTN (hypertension)   . Hyperlipemia   . Left renal mass   . Platelets decreased ([place])    DENIES UNSUAL BLEEDING   . PONV (postoperative nausea and vomiting)   . Renal cell carcinoma ([place]) 08/04/2019  . WBC decreased     SURGICAL HISTORY: Past Surgical History:  Procedure Laterality Date  . ANKLE SURGERY Left   . ANTERIOR CERVICAL DECOMP/DISCECTOMY FUSION N/A 04/16/2014   Procedure: ANTERIOR CERVICAL DECOMPRESSION/DISCECTOMY FUSION  (ACDF C5-C7)   (2 LEVELS)     ;  Surgeon: Melina Schools, MD;  Location: [facility];  Service: Orthopedics;  Laterality: N/A;  . APPENDECTOMY    . BACK SURGERY    . ESOPHAGOGASTRODUODENOSCOPY (EGD) WITH PROPOFOL N/A 02/06/2019   Procedure: ESOPHAGOGASTRODUODENOSCOPY (EGD) WITH PROPOFOL;  Surgeon: [name], MD;  Location[facility] END[facility]COPY;  Service: Gastroenterology;  Laterality: N/A;  . ESOPHAGOGASTRODUODENOSCOPY (EGD) WITH PROPOFOL N/A 03/04/2019   Procedure: ESOPHAGOGASTRODUODENOSCOPY (EGD) WITH PROPOFOL;  Surgeon: Lin Landsman, MD;  Location: [facility] ENDOSCOPY;  Service: Gastroenterology;  Laterality: N/A;  . ESOPHAGOGASTRODUODENOSCOPY (EGD) WITH PROPOFOL N/A 04/22/2019   Procedure: ESOPHAGOGASTRODUODENOSCOPY (EGD) WITH PROPOFOL;  Surgeon: [name], MD;  Location: [facility] ENDOSCOPY;  Service:  Gastroenterology;  Laterality: N/A;  . FRACTURE SURGERY    . HYDROCELE EXCISION    . KNEE ARTHROSCOPY Bilateral   . LAPAROSCOPIC NEPHRECTOMY, HAND ASSISTED Left 10/16/2018   Procedure: LEFT HAND ASSISTED LAPAROSCOPIC NEPHRECTOMY;   Surgeon: Lucas Mallow, MD;  Location: [facility] ORS;  Service: Urology;  Laterality: Left;  . NASAL SINUS SURGERY     X 2  . 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 PORTA CATH INSERTION N/A 11/11/2019   Procedure: PORTA CATH INSERTION;  Surgeon: Katha Cabal, MD;  Location: [place] [facility] CV LAB;  Service: Cardiovascular;  Laterality: N/A;  . SHOULDER SURGERY Left     SOCIAL HISTORY: Social History   Socioeconomic History  . Marital status: Married    Spouse name: Not on file  . Number of children: Not on file  . Years of education: Not on file  . Highest education level: Not on file  Occupational History  . Not on file  Tobacco Use  . Smoking status: Former Smoker    Packs/day: 0.25    Years: 20.00    Pack years: 5.00    Quit date: 2015    Years since quitting: 7.0  . Smokeless tobacco: Never Used  Vaping Use  . Vaping Use: Never used  Substance and Sexual Activity  . Alcohol use: Not Currently    Alcohol/week: 0.0 standard drinks    Comment: no alchol in 1 year  . Drug use: No  . Sexual activity: Not on file  Other Topics Concern  . Not on file  Social History Narrative  . Not on file   Social Determinants of Health   Financial Resource Strain: Not on file  Food Insecurity: Not on file  Transportation Needs: Not on file  Physical Activity: Not on file  Stress: Not on file  Social Connections: Not on file  Intimate Partner Violence: Not on file    FAMILY HISTORY: Family History  Problem Relation Age of Onset  . Diabetes Mother   . Cancer Mother        neuroendocrine cancer  . Cancer Father        neuroendocrine cancer of meninges  . Cancer Maternal Grandmother        tomach dx 39  . Alzheimer's disease Paternal Grandmother   . Lung cancer Paternal Grandfather   . Lung cancer Maternal Aunt   . Colon cancer Maternal Uncle   . Breast cancer Paternal Aunt        both dx 65s  . Ovarian cancer Other        dx 34  . Breast cancer Cousin      ALLERGIES:  is allergic to codeine and mucinex [guaifenesin er].  MEDICATIONS:  Current Outpatient Medications  Medication Sig Dispense Refill  . ACCU-CHEK AVIVA PLUS test strip CHECK BL00D SUGAR ONCE OR TWICE DAILY. 100 each PRN  . amLODipine (NORVASC) 10 MG tablet Take 10 mg by mouth daily.    . carvedilol (COREG) 25 MG tablet Take 1 tablet (25 mg total) by mouth 2 (two) times daily with a meal. 180 tablet 3  . Cholecalciferol (VITAMIN D3) 125 MCG (5000 UT) CAPS Take 5,000 Units by mouth daily.    Marland Kitchen glipiZIDE (GLUCOTROL) 5 MG tablet Take 1 tablet (5 mg total) by mouth daily before breakfast. 90 tablet 3  . hydrochlorothiazide (HYDRODIURIL) 25 MG tablet TAKE 1 TABLET DAILY. (Patient taking differently: Take 12.5 mg by mouth daily.) 90 tablet 0  .  insulin lispro (INSULIN LISPRO) 100 UNIT/ML KwikPen Junior Inject into the skin 3 (three) times daily as needed (Above 250). Sliding scale (Patient not taking: No sig reported)    . levofloxacin (LEVAQUIN) 500 MG tablet Take 1 tablet (500 mg total) by mouth daily. (Patient not taking: No sig reported) 6 tablet 0  . losartan (COZAAR) 100 MG tablet Take 1 tablet by mouth daily. 90 tablet 0  . lovastatin (MEVACOR) 40 MG tablet Take 1 tablet (40 mg total) by mouth at bedtime. 60 tablet 0  . metFORMIN (GLUCOPHAGE-XR) 500 MG 24 hr tablet Take 500 mg by mouth 2 (two) times daily.     . prochlorperazine (COMPAZINE) 10 MG tablet Take 1 tablet (10 mg total) by mouth every 6 (six) hours as needed for nausea or vomiting. (Patient not taking: No sig reported) 30 tablet 0  . vitamin B-12 (CYANOCOBALAMIN) 1000 MCG tablet Take 1 tablet (1,000 mcg total) by mouth daily. 90 tablet 0   No current facility-administered medications for this visit.   Facility-Administered Medications Ordered in Other Visits  Medication Dose Route Frequency Provider Last Rate Last Admin  . sodium chloride flush (NS) 0.9 % injection 10 mL  10 mL Intravenous PRN Earlie Server, MD   10 mL at  01/22/20 0831     PHYSICAL EXAMINATION: ECOG PERFORMANCE STATUS: 1 - Symptomatic but completely ambulatory Vitals:   09/02/20 0906  BP: 110/64  Pulse: 70  Resp: 18  Temp: (!) 97 F (36.1 C)   Filed Weights   09/02/20 0906  Weight: 246 lb 12.8 oz (111.9 kg)    Physical Exam Constitutional:      General: He is not in acute distress.    Appearance: He is obese.  HENT:     Head: Normocephalic and atraumatic.  Eyes:     General: No scleral icterus. Cardiovascular:     Rate and Rhythm: Normal rate and regular rhythm.     Heart sounds: Normal heart sounds.  Pulmonary:     Effort: Pulmonary effort is normal. No respiratory distress.     Breath sounds: No wheezing.  Abdominal:     General: Bowel sounds are normal. There is no distension.     Palpations: Abdomen is soft.  Musculoskeletal:        General: No deformity. Normal range of motion.     Cervical back: Normal range of motion and neck supple.  Skin:    General: Skin is warm and dry.     Findings: No erythema or rash.  Neurological:     Mental Status: He is alert and oriented to person, place, and time. Mental status is at baseline.     Cranial Nerves: No cranial nerve deficit.     Coordination: Coordination normal.  Psychiatric:        Mood and Affect: Mood normal.     LABORATORY DATA:  I have reviewed the data as listed Lab Results  Component Value Date   WBC 2.3 (L) 08/19/2020   HGB 9.7 (L) 08/19/2020   HCT 27.9 (L) 08/19/2020   MCV 89.7 08/19/2020   PLT 66 (L) 08/19/2020   Recent Labs    04/01/20 0837 04/15/20 0853 04/29/20 0849 05/21/20 1253 07/22/20 0921 08/05/20 0831 08/19/20 0843  NA 139 135 138   < > 133* 133* 134*  K 4.3 4.3 4.2   < > 4.0 4.1 4.7  CL 107 104 108   < > 105 103 103  CO2 21* 21* 23   < >  19* 21* 22  GLUCOSE 123* 162* 109*   < > 149* 204* 133*  BUN 24* 22* 20   < > 27* 23* 34*  CREATININE 1.69* 1.87* 1.58*   < > 1.52* 1.67* 1.70*  CALCIUM 8.5* 8.6* 8.7*   < > 9.0 8.9 8.8*   GFRNONAA 44* 39* 48*   < > 53* 47* 46*  GFRAA 51* 45* 55*  --   --   --   --   PROT 7.9 7.8 8.0   < > 7.5 7.9 7.6  ALBUMIN 4.0 4.0 4.3   < > 3.8 3.8 3.7  AST 31 34 29   < > 31 28 33  ALT $Re'24 27 21   'ATN$ < > $R'26 23 22  'YY$ ALKPHOS 102 117 108   < > 121 130* 115  BILITOT 0.9 0.8 1.5*   < > 1.1 1.0 0.9   < > = values in this interval not displayed.   Iron/TIBC/Ferritin/ %Sat    Component Value Date/Time   IRON 52 12/18/2019 0826   IRON 105 10/15/2018 1325   TIBC 284 12/18/2019 0826   TIBC 244 (L) 10/15/2018 1325   FERRITIN 146 12/18/2019 0826   FERRITIN 588 (H) 10/15/2018 1325   IRONPCTSAT 18 12/18/2019 0826   IRONPCTSAT 43 10/15/2018 1325      RADIOGRAPHIC STUDIES: I have personally reviewed the radiological images as listed and agreed with the findings in the report. CT CHEST ABDOMEN PELVIS WO CONTRAST  Result Date: 07/19/2020 CLINICAL DATA:  Restaging metastatic renal cell carcinoma post left nephrectomy. No current complaints. EXAM: CT CHEST, ABDOMEN AND PELVIS WITHOUT CONTRAST TECHNIQUE: Multidetector CT imaging of the chest, abdomen and pelvis was performed following the standard protocol without IV contrast. COMPARISON:  Previous CT 02/13/2020 FINDINGS: CT CHEST FINDINGS Cardiovascular: Right IJ Port-A-Cath extends to the level of the superior right atrium. There is diffuse atherosclerosis of the aorta, great vessels and coronary arteries. No acute vascular findings on noncontrast imaging. The heart size is normal. There is no pericardial effusion. Mediastinum/Nodes: Stable prominent left internal mammary lymph nodes measuring up to 5 mm on image 25/2. Small mediastinal lymph nodes are unchanged. There are no enlarged mediastinal, hilar or axillary lymph nodes. Hilar assessment is limited by the lack of intravenous contrast, although the hilar contours appear unchanged. The thyroid gland, trachea and esophagus demonstrate no significant findings. Lungs/Pleura: There is no pleural effusion.  There are new patchy ground-glass opacities throughout both lungs. Some of the subpleural components have central part solid/airspace components. There is a new solid right perihilar nodule measuring 9 x 6 mm on image 82/3 which may reflect a lymph node. No other solid pulmonary nodules identified. Musculoskeletal/Chest wall: No chest wall mass or suspicious osseous findings. Bilateral gynecomastia and previous lower cervical fusion noted. CT ABDOMEN AND PELVIS FINDINGS Hepatobiliary: The liver appears grossly stable as imaged in the noncontrast state with diffuse contour irregularity and relative enlargement of the left and caudate lobes, consistent with cirrhosis. No focal lesions identified. No evidence of gallstones, gallbladder wall thickening or biliary dilatation. Pancreas: Unremarkable. No pancreatic ductal dilatation or surrounding inflammatory changes. Spleen: Stable moderate splenomegaly. Adrenals/Urinary Tract: Both adrenal glands appear normal. Previous left nephrectomy without mass in the nephrectomy bed. The right kidney appears unremarkable as imaged in the noncontrast state. The bladder appears unremarkable for its degree of distention. Stomach/Bowel: No evidence of bowel wall thickening, distention or surrounding inflammatory change. Incomplete distension of the cecum. Vascular/Lymphatic: Scattered small lymph nodes in  the gastrohepatic ligament and upper retroperitoneum are stable, likely related to the patient's cirrhosis. No progressive adenopathy identified. Diffuse aortic and branch vessel atherosclerosis again noted. Reproductive: The prostate gland and seminal vesicles appear unremarkable. Penile prosthesis and reservoir are again noted. Other: Abdominal ascites has mildly increased in volume. There is extension of ascites through a small left periumbilical hernia (image 373/4). No herniated bowel or peritoneal nodularity identified. Grossly stable edema throughout the mesenteric fat.  Musculoskeletal: No acute or significant osseous findings. IMPRESSION: 1. New patchy ground-glass opacities throughout both lungs with central part solid/airspace components. These findings are nonspecific and could be secondary to an atypical infection (including viral pneumonia) or inflammation (including drug reaction). 2. New solid right perihilar nodule, possibly a reactive lymph node. Recommend attention on follow-up. 3. No specific evidence of metastatic disease in the chest, abdomen or pelvis. Stable small left internal mammary and upper abdominal lymph nodes. 4. Cirrhosis and portal hypertension. Mildly increased ascites with extension of ascites through a small left periumbilical hernia. 5. Aortic Atherosclerosis (ICD10-I70.0). 6. These results will be called to the ordering clinician or representative by the Radiologist Assistant, and communication documented in the PACS or [employer]. Electronically Signed   By: Richardean Sale M.D.   On: 07/19/2020 08:39     ASSESSMENT & PLAN:  1. Renal cell carcinoma of left kidney (HCC)   2. Encounter for antineoplastic immunotherapy   3. Thrombocytopenia ([place])   4. Anemia due to stage 3b chronic kidney disease (HCC)   5. 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 TSH has been monitored.  Slightly elevated.  Normal free T4.  Discussed with patient that we will continue monitor the thyroid function.  #Chronic thrombocytopenia and neutropenia,  due to liver cirrhosis Counts are stable.   #Chronic kidney disease, creatinine is getting worse.  Was seen by nephrology recently  #Anemia, chronic, hemoglobin is 9.4, close to his baseline.  Continue to monitor. Iron panel is pending  #Diabetes, follow-up with primary care provider.  Recommend him to have A1c checked. Follow-up 2 week lab MD for assessment evaluation prior to  nivolumab. All questions were answered. The patient knows to call the clinic with any problems questions or concerns.   Earlie Server, MD, PhD Hematology Oncology [facility] [address] [facility] Pager- [phone] 09/02/2020

## 2020-09-16 ENCOUNTER — Encounter: Payer: Self-pay | Admitting: Oncology

## 2020-09-16 ENCOUNTER — Inpatient Hospital Stay (HOSPITAL_BASED_OUTPATIENT_CLINIC_OR_DEPARTMENT_OTHER): Payer: Medicare Other | Admitting: Oncology

## 2020-09-16 ENCOUNTER — Inpatient Hospital Stay: Payer: Medicare Other

## 2020-09-16 VITALS — BP 131/85 | HR 77 | Temp 97.1°F | Resp 18 | Wt 247.9 lb

## 2020-09-16 DIAGNOSIS — C642 Malignant neoplasm of left kidney, except renal pelvis: Secondary | ICD-10-CM

## 2020-09-16 DIAGNOSIS — Z5112 Encounter for antineoplastic immunotherapy: Secondary | ICD-10-CM

## 2020-09-16 DIAGNOSIS — D696 Thrombocytopenia, unspecified: Secondary | ICD-10-CM

## 2020-09-16 DIAGNOSIS — N1832 Chronic kidney disease, stage 3b: Secondary | ICD-10-CM | POA: Diagnosis not present

## 2020-09-16 DIAGNOSIS — C649 Malignant neoplasm of unspecified kidney, except renal pelvis: Secondary | ICD-10-CM

## 2020-09-16 DIAGNOSIS — D631 Anemia in chronic kidney disease: Secondary | ICD-10-CM

## 2020-09-16 DIAGNOSIS — D708 Other neutropenia: Secondary | ICD-10-CM

## 2020-09-16 LAB — COMPREHENSIVE METABOLIC PANEL
ALT: 31 U/L (ref 0–44)
AST: 34 U/L (ref 15–41)
Albumin: 3.8 g/dL (ref 3.5–5.0)
Alkaline Phosphatase: 117 U/L (ref 38–126)
Anion gap: 10 (ref 5–15)
BUN: 25 mg/dL — ABNORMAL HIGH (ref 6–20)
CO2: 19 mmol/L — ABNORMAL LOW (ref 22–32)
Calcium: 8.7 mg/dL — ABNORMAL LOW (ref 8.9–10.3)
Chloride: 109 mmol/L (ref 98–111)
Creatinine, Ser: 1.67 mg/dL — ABNORMAL HIGH (ref 0.61–1.24)
GFR, Estimated: 47 mL/min — ABNORMAL LOW (ref >60)
Glucose, Bld: 112 mg/dL — ABNORMAL HIGH (ref 70–99)
Potassium: 4.3 mmol/L (ref 3.5–5.1)
Sodium: 138 mmol/L (ref 135–145)
Total Bilirubin: 0.9 mg/dL (ref 0.3–1.2)
Total Protein: 7.9 g/dL (ref 6.5–8.1)

## 2020-09-16 LAB — CBC WITH DIFFERENTIAL/PLATELET
Abs Immature Granulocytes: 0.01 10*3/uL (ref 0.00–0.07)
Basophils Absolute: 0 10*3/uL (ref 0.0–0.1)
Basophils Relative: 1 %
Eosinophils Absolute: 0.1 10*3/uL (ref 0.0–0.5)
Eosinophils Relative: 5 %
HCT: 28.3 % — ABNORMAL LOW (ref 39.0–52.0)
Hemoglobin: 9.7 g/dL — ABNORMAL LOW (ref 13.0–17.0)
Immature Granulocytes: 0 %
Lymphocytes Relative: 18 %
Lymphs Abs: 0.4 10*3/uL — ABNORMAL LOW (ref 0.7–4.0)
MCH: 31.3 pg (ref 26.0–34.0)
MCHC: 34.3 g/dL (ref 30.0–36.0)
MCV: 91.3 fL (ref 80.0–100.0)
Monocytes Absolute: 0.3 10*3/uL (ref 0.1–1.0)
Monocytes Relative: 14 %
Neutro Abs: 1.4 10*3/uL — ABNORMAL LOW (ref 1.7–7.7)
Neutrophils Relative %: 62 %
Platelets: 69 10*3/uL — ABNORMAL LOW (ref 150–400)
RBC: 3.1 MIL/uL — ABNORMAL LOW (ref 4.22–5.81)
RDW: 14 % (ref 11.5–15.5)
WBC: 2.3 10*3/uL — ABNORMAL LOW (ref 4.0–10.5)
nRBC: 0 % (ref 0.0–0.2)

## 2020-09-16 LAB — TSH: TSH: 4.389 u[IU]/mL (ref 0.350–4.500)

## 2020-09-16 MED ORDER — SODIUM CHLORIDE 0.9% FLUSH
10.0000 mL | Freq: Once | INTRAVENOUS | Status: AC
  Administered 2020-09-16: 10 mL via INTRAVENOUS
  Filled 2020-09-16: qty 10

## 2020-09-16 MED ORDER — HEPARIN SOD (PORK) LOCK FLUSH 100 UNIT/ML IV SOLN
INTRAVENOUS | Status: AC
  Filled 2020-09-16: qty 5

## 2020-09-16 MED ORDER — SODIUM CHLORIDE 0.9 % IV SOLN
240.0000 mg | Freq: Once | INTRAVENOUS | Status: AC
  Administered 2020-09-16: 240 mg via INTRAVENOUS
  Filled 2020-09-16: qty 24

## 2020-09-16 MED ORDER — SODIUM CHLORIDE 0.9 % IV SOLN
Freq: Once | INTRAVENOUS | Status: AC
  Filled 2020-09-16: qty 250

## 2020-09-16 MED ORDER — HEPARIN SOD (PORK) LOCK FLUSH 100 UNIT/ML IV SOLN
500.0000 [IU] | Freq: Once | INTRAVENOUS | Status: AC
  Administered 2020-09-16: 500 [IU] via INTRAVENOUS
  Filled 2020-09-16: qty 5

## 2020-09-16 NOTE — Progress Notes (Signed)
Patient denies new problems/concerns today.   °

## 2020-09-16 NOTE — Progress Notes (Signed)
Hematology/Oncology follow-up [facility] Telephone:([phone] [phone]   Patient Care Team: Albina Billet, MD as PCP - General (Internal Medicine) Earlie Server, MD as Consulting Physician (Hematology and Oncology)  REFERRING PROVIDER: Albina Billet, MD  CHIEF COMPLAINTS/REASON FOR VISIT:  Follow-up for kidney cancer treatments  HISTORY OF PRESENTING ILLNESS:   Henry Moore is a  60 y.o.  male with PMH listed below was seen in consultation at the request of  Albina Billet, MD  for evaluation of kidney cancer Extensive medical records were reviewed Patient had MRI lumbar spine without contrast done on 07/13/2018 which showed mired lumbar degenerative disease. CT thoracic spine was done on 08/05/2018 which showed 5 cm left renal mass. 09/04/2018 CT abdomen and pelvis with and without contrast showed 5 cm round enhancing solid mass in the left kidney.  No involvement of left renal vein.  No lymphadenopathy Morphologic changes consistent with cirrhosis and the portal hypertension.  Small volume ascites and splenomegaly. . 10/16/2018 Left nephrectomy showed renal cell carcinoma, clear-cell type, nuclear grade 4, tumor extends into the renal vein and renal sinus fat.  Urethral, vascular and or resection margins are negative for tumor pT3a Nx Mx  01/28/2019 patient had another CT chest abdomen pelvis done with contrast Showed interval left nephrectomy.  Scattered tiny pulmonary nodules bilaterally.  Nonspecific.  No other evidence of metastatic disease.  Cirrhosis changes extensive coronary and aortic atherosclerosis  07/14/2019 patient underwent surveillance CT chest abdomen pelvis without contrast Interval progression of pulmonary metastasis with new enlarged right paratracheal lymph node 1.7 cm, previously 0.6 cm one-point since worrisome for metastatic adenopathy.  Multiple progressive pulmonary nodules, index nodule within the lingula measures 1.3 cm, previously  3 mm. Index subpleural nodule within the anterior right upper lobe measuring 1.8 cm, previously 3 cm Index nodule within the post lateral right lower lobe measuring 5 mm, new from previous care Aortic sclerosis.  Three-vessel coronary artery calcification noted.  Cirrhosis changes.  Splenomegaly.  Patient was referred to establish care with medical oncology for further discussion and evaluation of metastatic renal cell carcinoma. Patient reports feeling well at baseline today.  Denies any shortness of breath, cough, hemoptysis, back pain, headache. He quitted smoking 2015.  Not currently actively drinking alcohol as well. Patient has chronic back pain unchanged.  # Liver cirrhosis with small amount of ascites/splenomegaly patient sees gastroenterology Dr. [name].  . He will get ultrasound surveillance due in February 2021 for screening of [place].  EGD surveillance for Barrett's plan in April 2021.  # Diabetes, on Humalog sliding scale and metformin.  #Family history of cancer and personal history of RCC.  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  Status post ipilimumab and nivolumab treatment.  # 10/29/2019 CT chest abdomen pelvis without contrast images were independently reviewed by me and discussed with patient.   CT showed marked improvement with resolution of many of the pulmonary nodules, prominent reduced size of other nodules.  Considerably reduced size of the right lower paratracheal lymph node now 0.9 cm in diameter. Hepatic cirrhosis with prominent splenomegaly and trace perisplenic ascites. Chronic CT findings includes aortic atherosclerotic vascular disease.  Mild sigmoid colon diverticulosis  # CKD, he establish care with Dr. Candiss Norse for chronic  kidney disease.  Blood pressure medication has been adjusted.  Hydrochlorothiazide decreased to 12.5 mg daily.  # NGS -foundation one PD-L1 TPS 0% no reportable mutation, MS stable. TMB 8 muts/mb VHL D161fs*16, SETD2 BAP Genetic testing is negative.   # 07/16/2020, CT chest abdomen pelvis images were reviewed and discussed with patient Compared to July 2021 image, he has had a new patchy groundglass opacities throughout both lungs with central part of solids/airspace components.  Findings are nonspecific and could be secondary to an atypical infection, including viral pneumonia or inflammation.  Patient is asymptomatic.  I wonder the changes are secondary to his Covid infection.  Patient has been started on Levaquin to cover atypical infection by on-call physician and I recommend him to continue and complete the course. Proceed with today's immunotherapy nivolumab.  Advised patient to notify me if his symptoms get worse.  INTERVAL HISTORY Henry Moore is a 60 y.o. male who has above history reviewed by me today presents for follow up visit for management of stage IV RCC  Problems and complaints are listed below: Patient reports doing well.  No new complaints.  Denies any shortness of breath, chest pain, nausea vomiting diarrhea.  Chronic joint pain.    Review of Systems  Constitutional: Positive for fatigue. Negative for appetite change, chills, fever and unexpected weight change.  HENT:   Negative for hearing loss and voice change.   Eyes: Negative for eye problems and icterus.  Respiratory: Negative for chest tightness, cough and shortness of breath.   Cardiovascular: Negative for chest pain and leg swelling.  Gastrointestinal: Negative for abdominal distention and abdominal pain.  Endocrine: Negative for hot flashes.  Genitourinary: Negative for difficulty urinating, dysuria and frequency.   Musculoskeletal: Positive for arthralgias.       Chronic back pain  Skin: Negative for itching  and rash.       Hair loss  Neurological: Negative for light-headedness and numbness.  Hematological: Negative for adenopathy. Does not bruise/bleed easily.  Psychiatric/Behavioral: Negative for confusion.    MEDICAL HISTORY:  Past Medical History:  Diagnosis Date  . Cough    SINUS DRAINAGE CAUSING COUGHING , REPORTS CLEAR MUCOUS   . Diabetes mellitus without complication ([place])   . Family history of breast cancer   . Family history of colon cancer   . Family history of stomach cancer   . HTN (hypertension)   . Hyperlipemia   . Left renal mass   . Platelets decreased ([place])    DENIES UNSUAL BLEEDING   . PONV (postoperative nausea and vomiting)   . Renal cell carcinoma ([place]) 08/04/2019  . WBC decreased     SURGICAL HISTORY: Past Surgical History:  Procedure Laterality Date  . ANKLE SURGERY Left   . ANTERIOR CERVICAL DECOMP/DISCECTOMY FUSION N/A 04/16/2014   Procedure: ANTERIOR CERVICAL DECOMPRESSION/DISCECTOMY FUSION  (ACDF C5-C7)   (2 LEVELS)     ;  Surgeon: Melina Schools, MD;  Location: [place];  Service: Orthopedics;  Laterality: N/A;  . APPENDECTOMY    . BACK SURGERY    . ESOPHAGOGASTRODUODENOSCOPY (EGD) WITH PROPOFOL N/A 02/06/2019   Procedure: ESOPHAGOGASTRODUODENOSCOPY (EGD) WITH PROPOFOL;  Surgeon: [name], MD;  Location[facility] ENDOSCOPY;  Service: Gastroenterology;  Laterality: N/A;  . ESOPHAGOGASTRODUODENOSCOPY (EGD) WITH PROPOFOL N/A 03/04/2019   Procedure: ESOPHAGOGASTRODUODENOSCOPY (EGD) WITH PROPOFOL;  Surgeon: Lin Landsman, MD;  Location[facility] ENDOSCOPY;  Service: Gastroenterology;  Laterality: N/A;  . ESOPHAGOGASTRODUODENOSCOPY (EGD) WITH PROPOFOL N/A 04/22/2019   Procedure: ESOPHAGOGASTRODUODENOSCOPY (EGD) WITH PROPOFOL;  Surgeon: [name],  Sharlet Salina, MD;  Location: [facility] ENDOSCOPY;  Service: Gastroenterology;  Laterality: N/A;  . FRACTURE SURGERY    . HYDROCELE EXCISION    . KNEE ARTHROSCOPY Bilateral   . LAPAROSCOPIC NEPHRECTOMY, HAND ASSISTED Left 10/16/2018    Procedure: LEFT HAND ASSISTED LAPAROSCOPIC NEPHRECTOMY;  Surgeon: Crista Elliot, MD;  Location: [facility] ORS;  Service: Urology;  Laterality: Left;  . NASAL SINUS SURGERY     X 2  . 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 PORTA CATH INSERTION N/A 11/11/2019   Procedure: PORTA CATH INSERTION;  Surgeon: Renford Dills, MD;  Location: [facility] INVASIVE CV LAB;  Service: Cardiovascular;  Laterality: N/A;  . SHOULDER SURGERY Left     SOCIAL HISTORY: Social History   Socioeconomic History  . Marital status: Married    Spouse name: Not on file  . Number of children: Not on file  . Years of education: Not on file  . Highest education level: Not on file  Occupational History  . Not on file  Tobacco Use  . Smoking status: Former Smoker    Packs/day: 0.25    Years: 20.00    Pack years: 5.00    Quit date: 2015    Years since quitting: 7.1  . Smokeless tobacco: Never Used  Vaping Use  . Vaping Use: Never used  Substance and Sexual Activity  . Alcohol use: Not Currently    Alcohol/week: 0.0 standard drinks    Comment: no alchol in 1 year  . Drug use: No  . Sexual activity: Not on file  Other Topics Concern  . Not on file  Social History Narrative  . Not on file   Social Determinants of Health   Financial Resource Strain: Not on file  Food Insecurity: Not on file  Transportation Needs: Not on file  Physical Activity: Not on file  Stress: Not on file  Social Connections: Not on file  Intimate Partner Violence: Not on file    FAMILY HISTORY: Family History  Problem Relation Age of Onset  . Diabetes Mother   . Cancer Mother        neuroendocrine cancer  . Cancer Father        neuroendocrine cancer of meninges  . Cancer Maternal Grandmother        tomach dx 64  . Alzheimer's disease Paternal Grandmother   . Lung cancer Paternal Grandfather   . Lung cancer Maternal Aunt   . Colon cancer Maternal Uncle   . Breast cancer Paternal Aunt        both dx 81s  . Ovarian  cancer Other        dx 70  . Breast cancer Cousin     ALLERGIES:  is allergic to codeine and mucinex [guaifenesin er].  MEDICATIONS:  Current Outpatient Medications  Medication Sig Dispense Refill  . ACCU-CHEK AVIVA PLUS test strip CHECK BL00D SUGAR ONCE OR TWICE DAILY. 100 each PRN  . amLODipine (NORVASC) 10 MG tablet Take 10 mg by mouth daily.    . carvedilol (COREG) 25 MG tablet Take 1 tablet (25 mg total) by mouth 2 (two) times daily with a meal. 180 tablet 3  . Cholecalciferol (VITAMIN D3) 125 MCG (5000 UT) CAPS Take 5,000 Units by mouth daily.    . diclofenac Sodium (VOLTAREN) 1 % GEL Apply 2 g topically 4 (four) times daily. 100 g 2  . glipiZIDE (GLUCOTROL) 5 MG tablet Take 1 tablet (5 mg total) by mouth daily before breakfast. 90  tablet 3  . hydrochlorothiazide (HYDRODIURIL) 25 MG tablet TAKE 1 TABLET DAILY. (Patient taking differently: Take 12.5 mg by mouth daily.) 90 tablet 0  . losartan (COZAAR) 100 MG tablet Take 1 tablet by mouth daily. 90 tablet 0  . lovastatin (MEVACOR) 40 MG tablet Take 1 tablet (40 mg total) by mouth at bedtime. 60 tablet 0  . metFORMIN (GLUCOPHAGE-XR) 500 MG 24 hr tablet Take 500 mg by mouth 2 (two) times daily. 2 QAM, 2 QHS    . prochlorperazine (COMPAZINE) 10 MG tablet Take 1 tablet (10 mg total) by mouth every 6 (six) hours as needed for nausea or vomiting. 30 tablet 0  . vitamin B-12 (CYANOCOBALAMIN) 1000 MCG tablet Take 1 tablet (1,000 mcg total) by mouth daily. 90 tablet 0  . insulin aspart (NOVOLOG) 100 UNIT/ML injection Inject into the skin. Inject under the skin 3 (three) times a day before meals Sliding scale, Daughters insulin, using while sugar is very out of wack from cancer treatments, temporary (Patient not taking: Reported on 09/16/2020)    . insulin detemir (LEVEMIR) 100 UNIT/ML injection Inject into the skin. Inject 15 units under the skin every night at night, temporary, daughter's insulin (Patient not taking: Reported on 09/16/2020)    .  insulin lispro (INSULIN LISPRO) 100 UNIT/ML KwikPen Junior Inject into the skin 3 (three) times daily as needed (Above 250). Sliding scale (Patient not taking: No sig reported)    . levofloxacin (LEVAQUIN) 500 MG tablet Take 1 tablet (500 mg total) by mouth daily. (Patient not taking: No sig reported) 6 tablet 0   No current facility-administered medications for this visit.   Facility-Administered Medications Ordered in Other Visits  Medication Dose Route Frequency Provider Last Rate Last Admin  . sodium chloride flush (NS) 0.9 % injection 10 mL  10 mL Intravenous PRN Rickard Patience, MD   10 mL at 01/22/20 0831     PHYSICAL EXAMINATION: ECOG PERFORMANCE STATUS: 1 - Symptomatic but completely ambulatory Vitals:   09/16/20 0914  BP: 131/85  Pulse: 77  Resp: 18  Temp: (!) 97.1 F (36.2 C)   Filed Weights   09/16/20 0914  Weight: 247 lb 14.4 oz (112.4 kg)    Physical Exam Constitutional:      General: He is not in acute distress.    Appearance: He is obese.  HENT:     Head: Normocephalic and atraumatic.  Eyes:     General: No scleral icterus. Cardiovascular:     Rate and Rhythm: Normal rate and regular rhythm.     Heart sounds: Normal heart sounds.  Pulmonary:     Effort: Pulmonary effort is normal. No respiratory distress.     Breath sounds: No wheezing.  Abdominal:     General: Bowel sounds are normal. There is no distension.     Palpations: Abdomen is soft.  Musculoskeletal:        General: No deformity. Normal range of motion.     Cervical back: Normal range of motion and neck supple.  Skin:    General: Skin is warm and dry.     Findings: No erythema or rash.  Neurological:     Mental Status: He is alert and oriented to person, place, and time. Mental status is at baseline.     Cranial Nerves: No cranial nerve deficit.     Coordination: Coordination normal.  Psychiatric:        Mood and Affect: Mood normal.     LABORATORY DATA:  I have  reviewed the data as  listed Lab Results  Component Value Date   WBC 2.3 (L) 09/16/2020   HGB 9.7 (L) 09/16/2020   HCT 28.3 (L) 09/16/2020   MCV 91.3 09/16/2020   PLT 69 (L) 09/16/2020   Recent Labs    04/01/20 0837 04/15/20 0853 04/29/20 0849 05/21/20 1253 08/19/20 0843 09/02/20 0822 09/16/20 0849  NA 139 135 138   < > 134* 134* 138  K 4.3 4.3 4.2   < > 4.7 4.9 4.3  CL 107 104 108   < > 103 106 109  CO2 21* 21* 23   < > 22 19* 19*  GLUCOSE 123* 162* 109*   < > 133* 100* 112*  BUN 24* 22* 20   < > 34* 35* 25*  CREATININE 1.69* 1.87* 1.58*   < > 1.70* 1.79* 1.67*  CALCIUM 8.5* 8.6* 8.7*   < > 8.8* 9.0 8.7*  GFRNONAA 44* 39* 48*   < > 46* 44* 47*  GFRAA 51* 45* 55*  --   --   --   --   PROT 7.9 7.8 8.0   < > 7.6 7.8 7.9  ALBUMIN 4.0 4.0 4.3   < > 3.7 3.8 3.8  AST 31 34 29   < > 33 42* 34  ALT $Re'24 27 21   'TCx$ < > $R'22 31 31  'Bq$ ALKPHOS 102 117 108   < > 115 106 117  BILITOT 0.9 0.8 1.5*   < > 0.9 1.0 0.9   < > = values in this interval not displayed.   Iron/TIBC/Ferritin/ %Sat    Component Value Date/Time   IRON 60 09/02/2020 0822   IRON 105 10/15/2018 1325   TIBC 293 09/02/2020 0822   TIBC 244 (L) 10/15/2018 1325   FERRITIN 164 09/02/2020 0822   FERRITIN 588 (H) 10/15/2018 1325   IRONPCTSAT 21 09/02/2020 0822   IRONPCTSAT 43 10/15/2018 1325      RADIOGRAPHIC STUDIES: I have personally reviewed the radiological images as listed and agreed with the findings in the report. CT CHEST ABDOMEN PELVIS WO CONTRAST  Result Date: 07/19/2020 CLINICAL DATA:  Restaging metastatic renal cell carcinoma post left nephrectomy. No current complaints. EXAM: CT CHEST, ABDOMEN AND PELVIS WITHOUT CONTRAST TECHNIQUE: Multidetector CT imaging of the chest, abdomen and pelvis was performed following the standard protocol without IV contrast. COMPARISON:  Previous CT 02/13/2020 FINDINGS: CT CHEST FINDINGS Cardiovascular: Right IJ Port-A-Cath extends to the level of the superior right atrium. There is diffuse  atherosclerosis of the aorta, great vessels and coronary arteries. No acute vascular findings on noncontrast imaging. The heart size is normal. There is no pericardial effusion. Mediastinum/Nodes: Stable prominent left internal mammary lymph nodes measuring up to 5 mm on image 25/2. Small mediastinal lymph nodes are unchanged. There are no enlarged mediastinal, hilar or axillary lymph nodes. Hilar assessment is limited by the lack of intravenous contrast, although the hilar contours appear unchanged. The thyroid gland, trachea and esophagus demonstrate no significant findings. Lungs/Pleura: There is no pleural effusion. There are new patchy ground-glass opacities throughout both lungs. Some of the subpleural components have central part solid/airspace components. There is a new solid right perihilar nodule measuring 9 x 6 mm on image 82/3 which may reflect a lymph node. No other solid pulmonary nodules identified. Musculoskeletal/Chest wall: No chest wall mass or suspicious osseous findings. Bilateral gynecomastia and previous lower cervical fusion noted. CT ABDOMEN AND PELVIS FINDINGS Hepatobiliary: The liver appears grossly stable as imaged in  the noncontrast state with diffuse contour irregularity and relative enlargement of the left and caudate lobes, consistent with cirrhosis. No focal lesions identified. No evidence of gallstones, gallbladder wall thickening or biliary dilatation. Pancreas: Unremarkable. No pancreatic ductal dilatation or surrounding inflammatory changes. Spleen: Stable moderate splenomegaly. Adrenals/Urinary Tract: Both adrenal glands appear normal. Previous left nephrectomy without mass in the nephrectomy bed. The right kidney appears unremarkable as imaged in the noncontrast state. The bladder appears unremarkable for its degree of distention. Stomach/Bowel: No evidence of bowel wall thickening, distention or surrounding inflammatory change. Incomplete distension of the cecum.  Vascular/Lymphatic: Scattered small lymph nodes in the gastrohepatic ligament and upper retroperitoneum are stable, likely related to the patient's cirrhosis. No progressive adenopathy identified. Diffuse aortic and branch vessel atherosclerosis again noted. Reproductive: The prostate gland and seminal vesicles appear unremarkable. Penile prosthesis and reservoir are again noted. Other: Abdominal ascites has mildly increased in volume. There is extension of ascites through a small left periumbilical hernia (image 664/4). No herniated bowel or peritoneal nodularity identified. Grossly stable edema throughout the mesenteric fat. Musculoskeletal: No acute or significant osseous findings. IMPRESSION: 1. New patchy ground-glass opacities throughout both lungs with central part solid/airspace components. These findings are nonspecific and could be secondary to an atypical infection (including viral pneumonia) or inflammation (including drug reaction). 2. New solid right perihilar nodule, possibly a reactive lymph node. Recommend attention on follow-up. 3. No specific evidence of metastatic disease in the chest, abdomen or pelvis. Stable small left internal mammary and upper abdominal lymph nodes. 4. Cirrhosis and portal hypertension. Mildly increased ascites with extension of ascites through a small left periumbilical hernia. 5. Aortic Atherosclerosis (ICD10-I70.0). 6. These results will be called to the ordering clinician or representative by the Radiologist Assistant, and communication documented in the PACS or [employer]. Electronically Signed   By: Richardean Sale M.D.   On: 07/19/2020 08:39     ASSESSMENT & PLAN:  1. Renal cell carcinoma of left kidney (HCC)   2. Anemia due to stage 3b chronic kidney disease ([place])   3. Encounter for antineoplastic immunotherapy   4. Thrombocytopenia ([place])   5. 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 Plan to repeat CT surveillance in mid/late March   #Chronic thrombocytopenia and neutropenia,  due to liver cirrhosis Platelet count is 69,000.  ANC 1.4.  Stable.  #Chronic kidney disease, creatinine has improved encourage oral hydration.  #Anemia, chronic, hemoglobin is 9.7 , close to his baseline.  Continue to monitor. Iron panel showed ferritin of 164, iron saturation 21.  Stable.  #Diabetes, follow-up with primary care provider.  He has had A1c done with primary care provider. Follow-up 2 week lab MD for assessment evaluation prior to nivolumab. All questions were answered. The patient knows to call the clinic with any problems questions or concerns.   Earlie Server, MD, PhD Hematology Oncology [facility] Pager- [phone] 09/16/2020

## 2020-09-30 ENCOUNTER — Other Ambulatory Visit: Payer: Self-pay

## 2020-09-30 ENCOUNTER — Inpatient Hospital Stay: Payer: Medicare Other | Attending: Oncology

## 2020-09-30 ENCOUNTER — Inpatient Hospital Stay (HOSPITAL_BASED_OUTPATIENT_CLINIC_OR_DEPARTMENT_OTHER): Payer: Medicare Other | Admitting: Oncology

## 2020-09-30 ENCOUNTER — Encounter: Payer: Self-pay | Admitting: Oncology

## 2020-09-30 ENCOUNTER — Inpatient Hospital Stay: Payer: Medicare Other

## 2020-09-30 VITALS — BP 147/88 | HR 90 | Temp 98.0°F | Wt 243.2 lb

## 2020-09-30 DIAGNOSIS — D696 Thrombocytopenia, unspecified: Secondary | ICD-10-CM | POA: Insufficient documentation

## 2020-09-30 DIAGNOSIS — D709 Neutropenia, unspecified: Secondary | ICD-10-CM | POA: Diagnosis not present

## 2020-09-30 DIAGNOSIS — C642 Malignant neoplasm of left kidney, except renal pelvis: Secondary | ICD-10-CM

## 2020-09-30 DIAGNOSIS — E1122 Type 2 diabetes mellitus with diabetic chronic kidney disease: Secondary | ICD-10-CM | POA: Insufficient documentation

## 2020-09-30 DIAGNOSIS — Z803 Family history of malignant neoplasm of breast: Secondary | ICD-10-CM | POA: Insufficient documentation

## 2020-09-30 DIAGNOSIS — C649 Malignant neoplasm of unspecified kidney, except renal pelvis: Secondary | ICD-10-CM

## 2020-09-30 DIAGNOSIS — E11649 Type 2 diabetes mellitus with hypoglycemia without coma: Secondary | ICD-10-CM | POA: Insufficient documentation

## 2020-09-30 DIAGNOSIS — I129 Hypertensive chronic kidney disease with stage 1 through stage 4 chronic kidney disease, or unspecified chronic kidney disease: Secondary | ICD-10-CM | POA: Insufficient documentation

## 2020-09-30 DIAGNOSIS — N1832 Chronic kidney disease, stage 3b: Secondary | ICD-10-CM | POA: Diagnosis not present

## 2020-09-30 DIAGNOSIS — I951 Orthostatic hypotension: Secondary | ICD-10-CM | POA: Insufficient documentation

## 2020-09-30 DIAGNOSIS — I2510 Atherosclerotic heart disease of native coronary artery without angina pectoris: Secondary | ICD-10-CM | POA: Diagnosis not present

## 2020-09-30 DIAGNOSIS — Z5112 Encounter for antineoplastic immunotherapy: Secondary | ICD-10-CM | POA: Diagnosis not present

## 2020-09-30 DIAGNOSIS — C7800 Secondary malignant neoplasm of unspecified lung: Secondary | ICD-10-CM | POA: Insufficient documentation

## 2020-09-30 DIAGNOSIS — D708 Other neutropenia: Secondary | ICD-10-CM

## 2020-09-30 DIAGNOSIS — K7460 Unspecified cirrhosis of liver: Secondary | ICD-10-CM | POA: Diagnosis not present

## 2020-09-30 DIAGNOSIS — R161 Splenomegaly, not elsewhere classified: Secondary | ICD-10-CM | POA: Insufficient documentation

## 2020-09-30 DIAGNOSIS — E162 Hypoglycemia, unspecified: Secondary | ICD-10-CM

## 2020-09-30 DIAGNOSIS — Z801 Family history of malignant neoplasm of trachea, bronchus and lung: Secondary | ICD-10-CM | POA: Insufficient documentation

## 2020-09-30 DIAGNOSIS — D631 Anemia in chronic kidney disease: Secondary | ICD-10-CM

## 2020-09-30 DIAGNOSIS — Z87891 Personal history of nicotine dependence: Secondary | ICD-10-CM | POA: Insufficient documentation

## 2020-09-30 LAB — COMPREHENSIVE METABOLIC PANEL
ALT: 27 U/L (ref 0–44)
AST: 36 U/L (ref 15–41)
Albumin: 4.1 g/dL (ref 3.5–5.0)
Alkaline Phosphatase: 113 U/L (ref 38–126)
Anion gap: 11 (ref 5–15)
BUN: 28 mg/dL — ABNORMAL HIGH (ref 6–20)
CO2: 20 mmol/L — ABNORMAL LOW (ref 22–32)
Calcium: 9.5 mg/dL (ref 8.9–10.3)
Chloride: 105 mmol/L (ref 98–111)
Creatinine, Ser: 1.68 mg/dL — ABNORMAL HIGH (ref 0.61–1.24)
GFR, Estimated: 47 mL/min — ABNORMAL LOW (ref >60)
Glucose, Bld: 89 mg/dL (ref 70–99)
Potassium: 4.5 mmol/L (ref 3.5–5.1)
Sodium: 136 mmol/L (ref 135–145)
Total Bilirubin: 1.1 mg/dL (ref 0.3–1.2)
Total Protein: 8 g/dL (ref 6.5–8.1)

## 2020-09-30 LAB — HEMOGLOBIN A1C
Hgb A1c MFr Bld: 5.8 % — ABNORMAL HIGH (ref 4.8–5.6)
Mean Plasma Glucose: 119.76 mg/dL

## 2020-09-30 LAB — CBC WITH DIFFERENTIAL/PLATELET
Abs Immature Granulocytes: 0.02 10*3/uL (ref 0.00–0.07)
Basophils Absolute: 0 10*3/uL (ref 0.0–0.1)
Basophils Relative: 1 %
Eosinophils Absolute: 0.2 10*3/uL (ref 0.0–0.5)
Eosinophils Relative: 5 %
HCT: 30.4 % — ABNORMAL LOW (ref 39.0–52.0)
Hemoglobin: 10.4 g/dL — ABNORMAL LOW (ref 13.0–17.0)
Immature Granulocytes: 1 %
Lymphocytes Relative: 16 %
Lymphs Abs: 0.5 10*3/uL — ABNORMAL LOW (ref 0.7–4.0)
MCH: 31.2 pg (ref 26.0–34.0)
MCHC: 34.2 g/dL (ref 30.0–36.0)
MCV: 91.3 fL (ref 80.0–100.0)
Monocytes Absolute: 0.4 10*3/uL (ref 0.1–1.0)
Monocytes Relative: 13 %
Neutro Abs: 2 10*3/uL (ref 1.7–7.7)
Neutrophils Relative %: 64 %
Platelets: 74 10*3/uL — ABNORMAL LOW (ref 150–400)
RBC: 3.33 MIL/uL — ABNORMAL LOW (ref 4.22–5.81)
RDW: 14.5 % (ref 11.5–15.5)
WBC: 3.1 10*3/uL — ABNORMAL LOW (ref 4.0–10.5)
nRBC: 0 % (ref 0.0–0.2)

## 2020-09-30 LAB — LACTATE DEHYDROGENASE: LDH: 140 U/L (ref 98–192)

## 2020-09-30 MED ORDER — HEPARIN SOD (PORK) LOCK FLUSH 100 UNIT/ML IV SOLN
500.0000 [IU] | Freq: Once | INTRAVENOUS | Status: AC
  Administered 2020-09-30: 500 [IU] via INTRAVENOUS
  Filled 2020-09-30: qty 5

## 2020-09-30 MED ORDER — SODIUM CHLORIDE 0.9 % IV SOLN
Freq: Once | INTRAVENOUS | Status: AC
  Filled 2020-09-30: qty 250

## 2020-09-30 MED ORDER — HEPARIN SOD (PORK) LOCK FLUSH 100 UNIT/ML IV SOLN
500.0000 [IU] | Freq: Once | INTRAVENOUS | Status: DC | PRN
  Filled 2020-09-30: qty 5

## 2020-09-30 MED ORDER — SODIUM CHLORIDE 0.9% FLUSH
10.0000 mL | INTRAVENOUS | Status: DC | PRN
  Administered 2020-09-30: 10 mL via INTRAVENOUS
  Filled 2020-09-30: qty 10

## 2020-09-30 MED ORDER — SODIUM CHLORIDE 0.9 % IV SOLN
240.0000 mg | Freq: Once | INTRAVENOUS | Status: AC
  Administered 2020-09-30: 240 mg via INTRAVENOUS
  Filled 2020-09-30: qty 24

## 2020-09-30 MED ORDER — SODIUM CHLORIDE 0.9% FLUSH
10.0000 mL | INTRAVENOUS | Status: DC | PRN
  Filled 2020-09-30: qty 10

## 2020-09-30 NOTE — Addendum Note (Signed)
Addended by: Evelina Dun on: 09/30/2020 09:50 AM   Modules accepted: Orders

## 2020-09-30 NOTE — Progress Notes (Signed)
Hematology/Oncology follow-up [facility] Telephone:([phone] [phone]   Patient Care Team: Albina Billet, MD as PCP - General (Internal Medicine) Earlie Server, MD as Consulting Physician (Hematology and Oncology)  REFERRING PROVIDER: Albina Billet, MD  CHIEF COMPLAINTS/REASON FOR VISIT:  Follow-up for kidney cancer treatments  HISTORY OF PRESENTING ILLNESS:   Henry Moore is a  60 y.o.  male with PMH listed below was seen in consultation at the request of  Albina Billet, MD  for evaluation of kidney cancer Extensive medical records were reviewed Patient had MRI lumbar spine without contrast done on 07/13/2018 which showed mired lumbar degenerative disease. CT thoracic spine was done on 08/05/2018 which showed 5 cm left renal mass. 09/04/2018 CT abdomen and pelvis with and without contrast showed 5 cm round enhancing solid mass in the left kidney.  No involvement of left renal vein.  No lymphadenopathy Morphologic changes consistent with cirrhosis and the portal hypertension.  Small volume ascites and splenomegaly. . 10/16/2018 Left nephrectomy showed renal cell carcinoma, clear-cell type, nuclear grade 4, tumor extends into the renal vein and renal sinus fat.  Urethral, vascular and or resection margins are negative for tumor pT3a Nx Mx  01/28/2019 patient had another CT chest abdomen pelvis done with contrast Showed interval left nephrectomy.  Scattered tiny pulmonary nodules bilaterally.  Nonspecific.  No other evidence of metastatic disease.  Cirrhosis changes extensive coronary and aortic atherosclerosis  07/14/2019 patient underwent surveillance CT chest abdomen pelvis without contrast Interval progression of pulmonary metastasis with new enlarged right paratracheal lymph node 1.7 cm, previously 0.6 cm one-point since worrisome for metastatic adenopathy.  Multiple progressive pulmonary nodules, index nodule within the lingula measures 1.3 cm, previously  3 mm. Index subpleural nodule within the anterior right upper lobe measuring 1.8 cm, previously 3 cm Index nodule within the post lateral right lower lobe measuring 5 mm, new from previous care Aortic sclerosis.  Three-vessel coronary artery calcification noted.  Cirrhosis changes.  Splenomegaly.  Patient was referred to establish care with medical oncology for further discussion and evaluation of metastatic renal cell carcinoma. Patient reports feeling well at baseline today.  Denies any shortness of breath, cough, hemoptysis, back pain, headache. He quitted smoking 2015.  Not currently actively drinking alcohol as well. Patient has chronic back pain unchanged.  # Liver cirrhosis with small amount of ascites/splenomegaly patient sees gastroenterology Dr. [name].  . He will get ultrasound surveillance due in February 2021 for screening of [place].  EGD surveillance for Barrett's plan in April 2021.  # Diabetes, on Humalog sliding scale and metformin.  #Family history of cancer and personal history of RCC.  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  Status post ipilimumab and nivolumab treatment.  # 10/29/2019 CT chest abdomen pelvis without contrast images were independently reviewed by me and discussed with patient.   CT showed marked improvement with resolution of many of the pulmonary nodules, prominent reduced size of other nodules.  Considerably reduced size of the right lower paratracheal lymph node now 0.9 cm in diameter. Hepatic cirrhosis with prominent splenomegaly and trace perisplenic ascites. Chronic CT findings includes aortic atherosclerotic vascular disease.  Mild sigmoid colon diverticulosis  # CKD, he establish care with Dr. Candiss Norse for chronic  kidney disease.  Blood pressure medication has been adjusted.  Hydrochlorothiazide decreased to 12.5 mg daily.  # NGS -foundation one PD-L1 TPS 0% no reportable mutation, MS stable. TMB 8 muts/mb VHL [id number]*16, SETD2 BAP Genetic testing is negative.   # 07/16/2020, CT chest abdomen pelvis images were reviewed and discussed with patient Compared to July 2021 image, he has had a new patchy groundglass opacities throughout both lungs with central part of solids/airspace components.  Findings are nonspecific and could be secondary to an atypical infection, including viral pneumonia or inflammation.  Patient is asymptomatic.  I wonder the changes are secondary to his Covid infection.  Patient has been started on Levaquin to cover atypical infection by on-call physician and I recommend him to continue and complete the course. Proceed with today's immunotherapy nivolumab.  Advised patient to notify me if his symptoms get worse.  INTERVAL HISTORY Henry Moore is a 60 y.o. male who has above history reviewed by me today presents for follow up visit for management of stage IV RCC  Problems and complaints are listed below: Patient has experienced episodes of hypoglycemia.  Currently off insulin, off glipizide.  Only on Metformin. No nausea vomiting diarrhea.  He is on diabetic diet.  Review of Systems  Constitutional: Positive for fatigue. Negative for appetite change, chills, fever and unexpected weight change.  HENT:   Negative for hearing loss and voice change.   Eyes: Negative for eye problems and icterus.  Respiratory: Negative for chest tightness, cough and shortness of breath.   Cardiovascular: Negative for chest pain and leg swelling.  Gastrointestinal: Negative for abdominal distention and abdominal pain.  Endocrine: Negative for hot flashes.  Genitourinary: Negative for difficulty urinating, dysuria and frequency.   Musculoskeletal: Positive for arthralgias.       Chronic back pain  Skin:  Negative for itching and rash.       Hair loss  Neurological: Negative for light-headedness and numbness.  Hematological: Negative for adenopathy. Does not bruise/bleed easily.  Psychiatric/Behavioral: Negative for confusion.    MEDICAL HISTORY:  Past Medical History:  Diagnosis Date  . Cough    SINUS DRAINAGE CAUSING COUGHING , REPORTS CLEAR MUCOUS   . Diabetes mellitus without complication (HCC)   . Family history of breast cancer   . Family history of colon cancer   . Family history of stomach cancer   . HTN (hypertension)   . Hyperlipemia   . Left renal mass   . Platelets decreased (HCC)    DENIES UNSUAL BLEEDING   . PONV (postoperative nausea and vomiting)   . Renal cell carcinoma (HCC) 08/04/2019  . WBC decreased     SURGICAL HISTORY: Past Surgical History:  Procedure Laterality Date  . ANKLE SURGERY Left   . ANTERIOR CERVICAL DECOMP/DISCECTOMY FUSION N/A 04/16/2014   Procedure: ANTERIOR CERVICAL DECOMPRESSION/DISCECTOMY FUSION  (ACDF C5-C7)   (2 LEVELS)     ;  Surgeon: Venita Lick, MD;  Location: [facility] OR;  Service: Orthopedics;  Laterality: N/A;  . APPENDECTOMY    . BACK SURGERY    . ESOPHAGOGASTRODUODENOSCOPY (EGD) WITH PROPOFOL N/A 02/06/2019   Procedure: ESOPHAGOGASTRODUODENOSCOPY (EGD) WITH PROPOFOL;  Surgeon: Wyline Mood, MD;  Location: [facility] ENDOSCOPY;  Service: Gastroenterology;  Laterality: N/A;  . ESOPHAGOGASTRODUODENOSCOPY (EGD) WITH PROPOFOL N/A 03/04/2019   Procedure: ESOPHAGOGASTRODUODENOSCOPY (EGD) WITH PROPOFOL;  Surgeon: Toney Reil, MD;  Location: [facility] ENDOSCOPY;  Service: Gastroenterology;  Laterality: N/A;  . ESOPHAGOGASTRODUODENOSCOPY (EGD) WITH PROPOFOL N/A 04/22/2019   Procedure: ESOPHAGOGASTRODUODENOSCOPY (EGD) WITH PROPOFOL;  Surgeon:  [name], MD;  Location: [facility] ENDOSCOPY;  Service: Gastroenterology;  Laterality: N/A;  . FRACTURE SURGERY    . HYDROCELE EXCISION    . KNEE ARTHROSCOPY Bilateral   . LAPAROSCOPIC NEPHRECTOMY, HAND ASSISTED  Left 10/16/2018   Procedure: LEFT HAND ASSISTED LAPAROSCOPIC NEPHRECTOMY;  Surgeon: Lucas Mallow, MD;  Location: [facility] ORS;  Service: Urology;  Laterality: Left;  . NASAL SINUS SURGERY     X 2  . 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 PORTA CATH INSERTION N/A 11/11/2019   Procedure: PORTA CATH INSERTION;  Surgeon: Katha Cabal, MD;  Location: [facility];  Service: Cardiovascular;  Laterality: N/A;  . SHOULDER SURGERY Left     SOCIAL HISTORY: Social History   Socioeconomic History  . Marital status: Married    Spouse name: Not on file  . Number of children: Not on file  . Years of education: Not on file  . Highest education level: Not on file  Occupational History  . Not on file  Tobacco Use  . Smoking status: Former Smoker    Packs/day: 0.25    Years: 20.00    Pack years: 5.00    Quit date: 2015    Years since quitting: 7.1  . Smokeless tobacco: Never Used  Vaping Use  . Vaping Use: Never used  Substance and Sexual Activity  . Alcohol use: Not Currently    Alcohol/week: 0.0 standard drinks    Comment: no alchol in 1 year  . Drug use: No  . Sexual activity: Not on file  Other Topics Concern  . Not on file  Social History Narrative  . Not on file   Social Determinants of Health   Financial Resource Strain: Not on file  Food Insecurity: Not on file  Transportation Needs: Not on file  Physical Activity: Not on file  Stress: Not on file  Social Connections: Not on file  Intimate Partner Violence: Not on file    FAMILY HISTORY: Family History  Problem Relation Age of Onset  . Diabetes Mother   . Cancer Mother        neuroendocrine cancer  . Cancer Father        neuroendocrine cancer of meninges  . Cancer Maternal Grandmother        tomach dx 26  . Alzheimer's disease Paternal Grandmother   . Lung cancer Paternal Grandfather   . Lung cancer Maternal Aunt   . Colon cancer Maternal Uncle   . Breast cancer Paternal Aunt        both dx 62s   . Ovarian cancer Other        dx 3  . Breast cancer Cousin     ALLERGIES:  is allergic to codeine and mucinex [guaifenesin er].  MEDICATIONS:  Current Outpatient Medications  Medication Sig Dispense Refill  . ACCU-CHEK AVIVA PLUS test strip CHECK BL00D SUGAR ONCE OR TWICE DAILY. 100 each PRN  . amLODipine (NORVASC) 10 MG tablet Take 10 mg by mouth daily.    . carvedilol (COREG) 25 MG tablet Take 1 tablet (25 mg total) by mouth 2 (two) times daily with a meal. 180 tablet 3  . Cholecalciferol (VITAMIN D3) 125 MCG (5000 UT) CAPS Take 5,000 Units by mouth daily.    . diclofenac Sodium (VOLTAREN) 1 % GEL Apply 2 g topically 4 (four) times daily. 100 g 2  . glipiZIDE (GLUCOTROL) 5 MG tablet Take 1 tablet (5 mg total) by mouth daily before breakfast.  90 tablet 3  . hydrochlorothiazide (HYDRODIURIL) 25 MG tablet TAKE 1 TABLET DAILY. (Patient taking differently: Take 12.5 mg by mouth daily.) 90 tablet 0  . insulin aspart (NOVOLOG) 100 UNIT/ML injection Inject into the skin. Inject under the skin 3 (three) times a day before meals Sliding scale, Daughters insulin, using while sugar is very out of wack from cancer treatments, temporary (Patient not taking: Reported on 09/16/2020)    . insulin detemir (LEVEMIR) 100 UNIT/ML injection Inject into the skin. Inject 15 units under the skin every night at night, temporary, daughter's insulin (Patient not taking: Reported on 09/16/2020)    . insulin lispro (INSULIN LISPRO) 100 UNIT/ML KwikPen Junior Inject into the skin 3 (three) times daily as needed (Above 250). Sliding scale (Patient not taking: No sig reported)    . levofloxacin (LEVAQUIN) 500 MG tablet Take 1 tablet (500 mg total) by mouth daily. (Patient not taking: No sig reported) 6 tablet 0  . losartan (COZAAR) 100 MG tablet Take 1 tablet by mouth daily. 90 tablet 0  . lovastatin (MEVACOR) 40 MG tablet Take 1 tablet (40 mg total) by mouth at bedtime. 60 tablet 0  . metFORMIN (GLUCOPHAGE-XR) 500 MG 24  hr tablet Take 500 mg by mouth 2 (two) times daily. 2 QAM, 2 QHS    . prochlorperazine (COMPAZINE) 10 MG tablet Take 1 tablet (10 mg total) by mouth every 6 (six) hours as needed for nausea or vomiting. 30 tablet 0  . vitamin B-12 (CYANOCOBALAMIN) 1000 MCG tablet Take 1 tablet (1,000 mcg total) by mouth daily. 90 tablet 0   No current facility-administered medications for this visit.   Facility-Administered Medications Ordered in Other Visits  Medication Dose Route Frequency Provider Last Rate Last Admin  . heparin lock flush 100 unit/mL  500 Units Intravenous Once Earlie Server, MD      . sodium chloride flush (NS) 0.9 % injection 10 mL  10 mL Intravenous PRN Earlie Server, MD   10 mL at 01/22/20 0831  . sodium chloride flush (NS) 0.9 % injection 10 mL  10 mL Intravenous PRN Earlie Server, MD   10 mL at 09/30/20 0814     PHYSICAL EXAMINATION: ECOG PERFORMANCE STATUS: 1 - Symptomatic but completely ambulatory Vitals:   09/30/20 0842  BP: (!) 147/88  Pulse: 90  Temp: 98 F (36.7 C)  SpO2: 99%   Filed Weights   09/30/20 0842  Weight: 243 lb 3.2 oz (110.3 kg)    Physical Exam Constitutional:      General: He is not in acute distress.    Appearance: He is obese.  HENT:     Head: Normocephalic and atraumatic.  Eyes:     General: No scleral icterus. Cardiovascular:     Rate and Rhythm: Normal rate and regular rhythm.     Heart sounds: Normal heart sounds.  Pulmonary:     Effort: Pulmonary effort is normal. No respiratory distress.     Breath sounds: No wheezing.  Abdominal:     General: Bowel sounds are normal. There is no distension.     Palpations: Abdomen is soft.  Musculoskeletal:        General: No deformity. Normal range of motion.     Cervical back: Normal range of motion and neck supple.  Skin:    General: Skin is warm and dry.     Findings: No erythema or rash.  Neurological:     Mental Status: He is alert and oriented to person, place, and  time. Mental status is at baseline.      Cranial Nerves: No cranial nerve deficit.     Coordination: Coordination normal.  Psychiatric:        Mood and Affect: Mood normal.     LABORATORY DATA:  I have reviewed the data as listed Lab Results  Component Value Date   WBC 3.1 (L) 09/30/2020   HGB 10.4 (L) 09/30/2020   HCT 30.4 (L) 09/30/2020   MCV 91.3 09/30/2020   PLT 74 (L) 09/30/2020   Recent Labs    04/01/20 0837 04/15/20 0853 04/29/20 0849 05/21/20 1253 08/19/20 0843 09/02/20 0822 09/16/20 0849  NA 139 135 138   < > 134* 134* 138  K 4.3 4.3 4.2   < > 4.7 4.9 4.3  CL 107 104 108   < > 103 106 109  CO2 21* 21* 23   < > 22 19* 19*  GLUCOSE 123* 162* 109*   < > 133* 100* 112*  BUN 24* 22* 20   < > 34* 35* 25*  CREATININE 1.69* 1.87* 1.58*   < > 1.70* 1.79* 1.67*  CALCIUM 8.5* 8.6* 8.7*   < > 8.8* 9.0 8.7*  GFRNONAA 44* 39* 48*   < > 46* 44* 47*  GFRAA 51* 45* 55*  --   --   --   --   PROT 7.9 7.8 8.0   < > 7.6 7.8 7.9  ALBUMIN 4.0 4.0 4.3   < > 3.7 3.8 3.8  AST 31 34 29   < > 33 42* 34  ALT $Re'24 27 21   'wnA$ < > $R'22 31 31  'xP$ ALKPHOS 102 117 108   < > 115 106 117  BILITOT 0.9 0.8 1.5*   < > 0.9 1.0 0.9   < > = values in this interval not displayed.   Iron/TIBC/Ferritin/ %Sat    Component Value Date/Time   IRON 60 09/02/2020 0822   IRON 105 10/15/2018 1325   TIBC 293 09/02/2020 0822   TIBC 244 (L) 10/15/2018 1325   FERRITIN 164 09/02/2020 0822   FERRITIN 588 (H) 10/15/2018 1325   IRONPCTSAT 21 09/02/2020 0822   IRONPCTSAT 43 10/15/2018 1325      RADIOGRAPHIC STUDIES: I have personally reviewed the radiological images as listed and agreed with the findings in the report. CT CHEST ABDOMEN PELVIS WO CONTRAST  Result Date: 07/19/2020 CLINICAL DATA:  Restaging metastatic renal cell carcinoma post left nephrectomy. No current complaints. EXAM: CT CHEST, ABDOMEN AND PELVIS WITHOUT CONTRAST TECHNIQUE: Multidetector CT imaging of the chest, abdomen and pelvis was performed following the standard protocol without  IV contrast. COMPARISON:  Previous CT 02/13/2020 FINDINGS: CT CHEST FINDINGS Cardiovascular: Right IJ Port-A-Cath extends to the level of the superior right atrium. There is diffuse atherosclerosis of the aorta, great vessels and coronary arteries. No acute vascular findings on noncontrast imaging. The heart size is normal. There is no pericardial effusion. Mediastinum/Nodes: Stable prominent left internal mammary lymph nodes measuring up to 5 mm on image 25/2. Small mediastinal lymph nodes are unchanged. There are no enlarged mediastinal, hilar or axillary lymph nodes. Hilar assessment is limited by the lack of intravenous contrast, although the hilar contours appear unchanged. The thyroid gland, trachea and esophagus demonstrate no significant findings. Lungs/Pleura: There is no pleural effusion. There are new patchy ground-glass opacities throughout both lungs. Some of the subpleural components have central part solid/airspace components. There is a new solid right perihilar nodule measuring 9 x 6 mm on  image 82/3 which may reflect a lymph node. No other solid pulmonary nodules identified. Musculoskeletal/Chest wall: No chest wall mass or suspicious osseous findings. Bilateral gynecomastia and previous lower cervical fusion noted. CT ABDOMEN AND PELVIS FINDINGS Hepatobiliary: The liver appears grossly stable as imaged in the noncontrast state with diffuse contour irregularity and relative enlargement of the left and caudate lobes, consistent with cirrhosis. No focal lesions identified. No evidence of gallstones, gallbladder wall thickening or biliary dilatation. Pancreas: Unremarkable. No pancreatic ductal dilatation or surrounding inflammatory changes. Spleen: Stable moderate splenomegaly. Adrenals/Urinary Tract: Both adrenal glands appear normal. Previous left nephrectomy without mass in the nephrectomy bed. The right kidney appears unremarkable as imaged in the noncontrast state. The bladder appears  unremarkable for its degree of distention. Stomach/Bowel: No evidence of bowel wall thickening, distention or surrounding inflammatory change. Incomplete distension of the cecum. Vascular/Lymphatic: Scattered small lymph nodes in the gastrohepatic ligament and upper retroperitoneum are stable, likely related to the patient's cirrhosis. No progressive adenopathy identified. Diffuse aortic and branch vessel atherosclerosis again noted. Reproductive: The prostate gland and seminal vesicles appear unremarkable. Penile prosthesis and reservoir are again noted. Other: Abdominal ascites has mildly increased in volume. There is extension of ascites through a small left periumbilical hernia (image 016/0). No herniated bowel or peritoneal nodularity identified. Grossly stable edema throughout the mesenteric fat. Musculoskeletal: No acute or significant osseous findings. IMPRESSION: 1. New patchy ground-glass opacities throughout both lungs with central part solid/airspace components. These findings are nonspecific and could be secondary to an atypical infection (including viral pneumonia) or inflammation (including drug reaction). 2. New solid right perihilar nodule, possibly a reactive lymph node. Recommend attention on follow-up. 3. No specific evidence of metastatic disease in the chest, abdomen or pelvis. Stable small left internal mammary and upper abdominal lymph nodes. 4. Cirrhosis and portal hypertension. Mildly increased ascites with extension of ascites through a small left periumbilical hernia. 5. Aortic Atherosclerosis (ICD10-I70.0). 6. These results will be called to the ordering clinician or representative by the Radiologist Assistant, and communication documented in the PACS or [employer]. Electronically Signed   By: Richardean Sale M.D.   On: 07/19/2020 08:39     ASSESSMENT & PLAN:  1. Renal cell carcinoma of left kidney (HCC)   2. Anemia due to stage 3b chronic kidney disease ([place])   3. Encounter  for antineoplastic immunotherapy   4. Thrombocytopenia ([place])   5. Other neutropenia ([place])   6. 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 Counts acceptable to proceed with maintenance nivolumab today. Repeat CT chest abdomen pelvis without contrast for follow-up.   #Chronic thrombocytopenia and neutropenia,  due to liver cirrhosis Platelet and ANC counts are stable.  Monitor.  #Chronic kidney disease, encourage oral hydration and avoid nephrotoxin.  #Anemia, chronic, hemoglobin is 10.4, monitor Iron panel showed ferritin of 164, iron saturation 21.  Stable.  #Diabetes, hypoglycemia episodes.  Recommend patient to hold off Metformin.  Continue diabetic diet and monitor his glucose level. He is overdue following up with gastroenterology for liver cirrhosis.  Advised patient to make a follow-up appointment. ?  Brittle diabetes vs hypoglycemia due to liver cirrhosis. Follow-up 2 week lab MD for assessment evaluation prior to nivolumab. All questions were answered. The patient knows to call the clinic with any problems questions or concerns.   Earlie Server, MD, PhD Hematology Oncology [facility] Pager- [phone] 09/30/2020

## 2020-09-30 NOTE — Progress Notes (Signed)
Pt tolerated all infusions well today and left the chemo suite stable and ambulatory.  

## 2020-10-12 ENCOUNTER — Ambulatory Visit
Admission: RE | Admit: 2020-10-12 | Discharge: 2020-10-12 | Disposition: A | Discharge status: Home / Self Care | Payer: Medicare Other | Source: Ambulatory Visit | Attending: Oncology | Admitting: Oncology

## 2020-10-12 ENCOUNTER — Other Ambulatory Visit: Payer: Self-pay

## 2020-10-12 DIAGNOSIS — C642 Malignant neoplasm of left kidney, except renal pelvis: Secondary | ICD-10-CM | POA: Insufficient documentation

## 2020-10-14 ENCOUNTER — Inpatient Hospital Stay: Payer: Medicare Other

## 2020-10-14 ENCOUNTER — Inpatient Hospital Stay (HOSPITAL_BASED_OUTPATIENT_CLINIC_OR_DEPARTMENT_OTHER): Payer: Medicare Other | Admitting: Oncology

## 2020-10-14 ENCOUNTER — Encounter: Payer: Self-pay | Admitting: Oncology

## 2020-10-14 ENCOUNTER — Other Ambulatory Visit: Payer: Self-pay

## 2020-10-14 VITALS — BP 95/66 | HR 73 | Temp 97.8°F | Resp 18 | Wt 245.3 lb

## 2020-10-14 VITALS — BP 103/57 | HR 72 | Resp 18

## 2020-10-14 DIAGNOSIS — N1832 Chronic kidney disease, stage 3b: Secondary | ICD-10-CM | POA: Diagnosis not present

## 2020-10-14 DIAGNOSIS — C642 Malignant neoplasm of left kidney, except renal pelvis: Secondary | ICD-10-CM

## 2020-10-14 DIAGNOSIS — M899 Disorder of bone, unspecified: Secondary | ICD-10-CM | POA: Diagnosis not present

## 2020-10-14 DIAGNOSIS — Z5112 Encounter for antineoplastic immunotherapy: Secondary | ICD-10-CM | POA: Diagnosis not present

## 2020-10-14 DIAGNOSIS — D631 Anemia in chronic kidney disease: Secondary | ICD-10-CM | POA: Diagnosis not present

## 2020-10-14 DIAGNOSIS — C649 Malignant neoplasm of unspecified kidney, except renal pelvis: Secondary | ICD-10-CM

## 2020-10-14 LAB — COMPREHENSIVE METABOLIC PANEL
ALT: 37 U/L (ref 0–44)
AST: 39 U/L (ref 15–41)
Albumin: 3.9 g/dL (ref 3.5–5.0)
Alkaline Phosphatase: 120 U/L (ref 38–126)
Anion gap: 10 (ref 5–15)
BUN: 30 mg/dL — ABNORMAL HIGH (ref 6–20)
CO2: 20 mmol/L — ABNORMAL LOW (ref 22–32)
Calcium: 9.1 mg/dL (ref 8.9–10.3)
Chloride: 107 mmol/L (ref 98–111)
Creatinine, Ser: 1.94 mg/dL — ABNORMAL HIGH (ref 0.61–1.24)
GFR, Estimated: 40 mL/min — ABNORMAL LOW (ref >60)
Glucose, Bld: 145 mg/dL — ABNORMAL HIGH (ref 70–99)
Potassium: 4.6 mmol/L (ref 3.5–5.1)
Sodium: 137 mmol/L (ref 135–145)
Total Bilirubin: 1.1 mg/dL (ref 0.3–1.2)
Total Protein: 8 g/dL (ref 6.5–8.1)

## 2020-10-14 LAB — CBC WITH DIFFERENTIAL/PLATELET
Abs Immature Granulocytes: 0.02 10*3/uL (ref 0.00–0.07)
Basophils Absolute: 0 10*3/uL (ref 0.0–0.1)
Basophils Relative: 1 %
Eosinophils Absolute: 0.2 10*3/uL (ref 0.0–0.5)
Eosinophils Relative: 7 %
HCT: 29.3 % — ABNORMAL LOW (ref 39.0–52.0)
Hemoglobin: 9.9 g/dL — ABNORMAL LOW (ref 13.0–17.0)
Immature Granulocytes: 1 %
Lymphocytes Relative: 21 %
Lymphs Abs: 0.5 10*3/uL — ABNORMAL LOW (ref 0.7–4.0)
MCH: 31.1 pg (ref 26.0–34.0)
MCHC: 33.8 g/dL (ref 30.0–36.0)
MCV: 92.1 fL (ref 80.0–100.0)
Monocytes Absolute: 0.3 10*3/uL (ref 0.1–1.0)
Monocytes Relative: 13 %
Neutro Abs: 1.2 10*3/uL — ABNORMAL LOW (ref 1.7–7.7)
Neutrophils Relative %: 57 %
Platelets: 57 10*3/uL — ABNORMAL LOW (ref 150–400)
RBC: 3.18 MIL/uL — ABNORMAL LOW (ref 4.22–5.81)
RDW: 13.9 % (ref 11.5–15.5)
WBC: 2.2 10*3/uL — ABNORMAL LOW (ref 4.0–10.5)
nRBC: 0 % (ref 0.0–0.2)

## 2020-10-14 LAB — TSH: TSH: 5.991 u[IU]/mL — ABNORMAL HIGH (ref 0.350–4.500)

## 2020-10-14 MED ORDER — SODIUM CHLORIDE 0.9 % IV SOLN
Freq: Once | INTRAVENOUS | Status: AC
  Filled 2020-10-14: qty 250

## 2020-10-14 MED ORDER — HEPARIN SOD (PORK) LOCK FLUSH 100 UNIT/ML IV SOLN
INTRAVENOUS | Status: AC
  Filled 2020-10-14: qty 5

## 2020-10-14 MED ORDER — NIVOLUMAB CHEMO INJECTION 100 MG/10ML
240.0000 mg | Freq: Once | INTRAVENOUS | Status: AC
  Administered 2020-10-14: 240 mg via INTRAVENOUS
  Filled 2020-10-14: qty 24

## 2020-10-14 MED ORDER — HEPARIN SOD (PORK) LOCK FLUSH 100 UNIT/ML IV SOLN
500.0000 [IU] | Freq: Once | INTRAVENOUS | Status: AC
  Administered 2020-10-14: 500 [IU] via INTRAVENOUS
  Filled 2020-10-14: qty 5

## 2020-10-14 MED ORDER — SODIUM CHLORIDE 0.9% FLUSH
10.0000 mL | INTRAVENOUS | Status: DC | PRN
  Administered 2020-10-14: 10 mL via INTRAVENOUS
  Filled 2020-10-14: qty 10

## 2020-10-14 NOTE — Progress Notes (Signed)
Hematology/Oncology follow-up [facility] [phone] [phone]   Patient Care Team: Henry Billet, MD as PCP - General (Internal Medicine) Henry Server, MD as Consulting Physician (Hematology and Oncology)  REFERRING PROVIDER: Albina Billet, MD  CHIEF COMPLAINTS/REASON FOR VISIT:  Follow-up for kidney cancer treatments  HISTORY OF PRESENTING ILLNESS:   Henry Moore is a  60 y.o.  male with PMH listed below was seen in consultation at the request of  Henry Billet, MD  for evaluation of kidney cancer Extensive medical records were reviewed Patient had MRI lumbar spine without contrast done on 07/13/2018 which showed mired lumbar degenerative disease. CT thoracic spine was done on 08/05/2018 which showed 5 cm left renal mass. 09/04/2018 CT abdomen and pelvis with and without contrast showed 5 cm round enhancing solid mass in the left kidney.  No involvement of left renal vein.  No lymphadenopathy Morphologic changes consistent with cirrhosis and the portal hypertension.  Small volume ascites and splenomegaly. . 10/16/2018 Left nephrectomy showed renal cell carcinoma, clear-cell type, nuclear grade 4, tumor extends into the renal vein and renal sinus fat.  Urethral, vascular and or resection margins are negative for tumor pT3a Nx Mx  01/28/2019 patient had another CT chest abdomen pelvis done with contrast Showed interval left nephrectomy.  Scattered tiny pulmonary nodules bilaterally.  Nonspecific.  No other evidence of metastatic disease.  Cirrhosis changes extensive coronary and aortic atherosclerosis  07/14/2019 patient underwent surveillance CT chest abdomen pelvis without contrast Interval progression of pulmonary metastasis with new enlarged right paratracheal lymph node 1.7 cm, previously 0.6 cm one-point since worrisome for metastatic adenopathy.  Multiple progressive pulmonary nodules, index nodule within the lingula measures 1.3 cm, previously  3 mm. Index subpleural nodule within the anterior right upper lobe measuring 1.8 cm, previously 3 cm Index nodule within the post lateral right lower lobe measuring 5 mm, new from previous care Aortic sclerosis.  Three-vessel coronary artery calcification noted.  Cirrhosis changes.  Splenomegaly.  Patient was referred to establish care with medical oncology for further discussion and evaluation of metastatic renal cell carcinoma. Patient reports feeling well at baseline today.  Denies any shortness of breath, cough, hemoptysis, back pain, headache. He quitted smoking 2015.  Not currently actively drinking alcohol as well. Patient has chronic back pain unchanged.  #08/14/2019-10/16/2019 nivolumab and ipilimumab. #11/06/2019-nivolumab every 2 weeks maintenance.  # Liver cirrhosis with small amount of ascites/splenomegaly patient sees gastroenterology Henry Moore.  . He will get ultrasound surveillance due in February 2021 for screening of Henry Moore.  EGD surveillance for Barrett's plan in April 2021.  # Diabetes, on Humalog sliding scale and metformin.  #Family history of cancer and personal history of RCC.  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  Status post ipilimumab and nivolumab treatment.  # 10/29/2019 CT chest abdomen pelvis without contrast images were independently reviewed by me and discussed with patient.   CT showed marked improvement with resolution of many of the pulmonary nodules, prominent reduced size of other nodules.  Considerably reduced size of the right lower paratracheal lymph node now 0.9 cm in diameter. Hepatic cirrhosis with prominent splenomegaly and trace perisplenic ascites. Chronic CT findings includes aortic atherosclerotic vascular  disease.  Mild sigmoid colon diverticulosis  # CKD, he  establish care with Dr. Candiss Moore for chronic kidney disease.  Blood pressure medication has been adjusted.  Hydrochlorothiazide decreased to 12.5 mg daily.  # NGS -foundation one PD-L1 TPS 0% no reportable mutation, MS stable. TMB 8 muts/mb VHL D125f*16, SETD2 BAP Genetic testing is negative.   # 07/16/2020, CT chest abdomen pelvis images were reviewed and discussed with patient Compared to July 2021 image, he has had a new patchy groundglass opacities throughout both lungs with central part of solids/airspace components.  Findings are nonspecific and could be secondary to an atypical infection, including viral pneumonia or inflammation.  Patient is asymptomatic.  I wonder the changes are secondary to his Covid infection.  Patient has been started on Levaquin to cover atypical infection by on-call physician and I recommend him to continue and complete the course. Proceed with today's immunotherapy nivolumab.  Advised patient to notify me if his symptoms get worse.  INTERVAL HISTORY Henry PARKERis a 60y.o. male who has above history reviewed by me today presents for follow up visit for management of stage IV RCC  Problems and complaints are listed below: Patient has had styes in both eyes.  Was prescribed eyedrops by ophthalmologist. He feels lightheaded today.  Blood pressure is low in the office.  Chronic back pain, no change. laying: 99/61, 68 sitting: 78/54, 67 standing: 67/42, 67      Review of Systems  Constitutional: Positive for fatigue. Negative for appetite change, chills, fever and unexpected weight change.  HENT:   Negative for hearing loss and voice change.   Eyes: Negative for eye problems and icterus.  Respiratory: Negative for chest tightness, cough and shortness of breath.   Cardiovascular: Negative for chest pain and leg swelling.  Gastrointestinal: Negative for abdominal distention and abdominal pain.  Endocrine:  Negative for hot flashes.  Genitourinary: Negative for difficulty urinating, dysuria and frequency.   Musculoskeletal: Positive for arthralgias.       Chronic back pain  Skin: Negative for itching and rash.       Hair loss  Neurological: Negative for light-headedness and numbness.  Hematological: Negative for adenopathy. Does not bruise/bleed easily.  Psychiatric/Behavioral: Negative for confusion.    MEDICAL HISTORY:  Past Medical History:  Diagnosis Date  . Cough    SINUS DRAINAGE CAUSING COUGHING , REPORTS CLEAR MUCOUS   . Diabetes mellitus without complication ([facility]   . Family history of breast cancer   . Family history of colon cancer   . Family history of stomach cancer   . HTN (hypertension)   . Hyperlipemia   . Left renal mass   . Platelets decreased ([place]    DENIES UNSUAL BLEEDING   . PONV (postoperative nausea and vomiting)   . Renal cell carcinoma ([place] 08/04/2019  . WBC decreased     SURGICAL HISTORY: Past Surgical History:  Procedure Laterality Date  . ANKLE SURGERY Left   . ANTERIOR CERVICAL DECOMP/DISCECTOMY FUSION N/A 04/16/2014   Procedure: ANTERIOR CERVICAL DECOMPRESSION/DISCECTOMY FUSION  (ACDF C5-C7)   (2 LEVELS)     ;  Surgeon: DMelina Schools MD;  [place] [facility]  Service: Orthopedics;  Laterality: N/A;  . APPENDECTOMY    . BACK SURGERY    . ESOPHAGOGASTRODUODENOSCOPY (EGD) WITH PROPOFOL N/A 02/06/2019   Procedure: ESOPHAGOGASTRODUODENOSCOPY (EGD) WITH PROPOFOL;  Surgeon: AJonathon Bellows MD;  [facility];  Service: Gastroenterology;  Laterality: N/A;  . ESOPHAGOGASTRODUODENOSCOPY (EGD) WITH PROPOFOL N/A 03/04/2019   Procedure: ESOPHAGOGASTRODUODENOSCOPY (EGD) WITH PROPOFOL;  Surgeon: VLin Landsman  MD;  Location: [facility] ENDOSCOPY;  Service: Gastroenterology;  Laterality: N/A;  . ESOPHAGOGASTRODUODENOSCOPY (EGD) WITH PROPOFOL N/A 04/22/2019   Procedure: ESOPHAGOGASTRODUODENOSCOPY (EGD) WITH PROPOFOL;  Surgeon: [name], MD;  Location: [facility]  ENDOSCOPY;  Service: Gastroenterology;  Laterality: N/A;  . FRACTURE SURGERY    . HYDROCELE EXCISION    . KNEE ARTHROSCOPY Bilateral   . LAPAROSCOPIC NEPHRECTOMY, HAND ASSISTED Left 10/16/2018   Procedure: LEFT HAND ASSISTED LAPAROSCOPIC NEPHRECTOMY;  Surgeon: Lucas Mallow, MD;  Location: [facility];  Service: Urology;  Laterality: Left;  . NASAL SINUS SURGERY     X 2  . 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 PORTA CATH INSERTION N/A 11/11/2019   Procedure: PORTA CATH INSERTION;  Surgeon: Katha Cabal, MD;  Location: [facility];  Service: Cardiovascular;  Laterality: N/A;  . SHOULDER SURGERY Left     SOCIAL HISTORY: Social History   Socioeconomic History  . Marital status: Married    Spouse name: Not on file  . Number of children: Not on file  . Years of education: Not on file  . Highest education level: Not on file  Occupational History  . Not on file  Tobacco Use  . Smoking status: Former Smoker    Packs/day: 0.25    Years: 20.00    Pack years: 5.00    Quit date: 2015    Years since quitting: 7.2  . Smokeless tobacco: Never Used  Vaping Use  . Vaping Use: Never used  Substance and Sexual Activity  . Alcohol use: Not Currently    Alcohol/week: 0.0 standard drinks    Comment: no alchol in 1 year  . Drug use: No  . Sexual activity: Not on file  Other Topics Concern  . Not on file  Social History Narrative  . Not on file   Social Determinants of Health   Financial Resource Strain: Not on file  Food Insecurity: Not on file  Transportation Needs: Not on file  Physical Activity: Not on file  Stress: Not on file  Social Connections: Not on file  Intimate Partner Violence: Not on file    FAMILY HISTORY: Family History  Problem Relation Age of Onset  . Diabetes Mother   . Cancer Mother        neuroendocrine cancer  . Cancer Father        neuroendocrine cancer of meninges  . Cancer Maternal Grandmother        tomach dx 22  . Alzheimer's  disease Paternal Grandmother   . Lung cancer Paternal Grandfather   . Lung cancer Maternal Aunt   . Colon cancer Maternal Uncle   . Breast cancer Paternal Aunt        both dx 28s  . Ovarian cancer Other        dx 37  . Breast cancer Cousin     ALLERGIES:  is allergic to codeine and mucinex [guaifenesin er].  MEDICATIONS:  Current Outpatient Medications  Medication Sig Dispense Refill  . ACCU-CHEK AVIVA PLUS test strip CHECK BL00D SUGAR ONCE OR TWICE DAILY. 100 each PRN  . amLODipine (NORVASC) 10 MG tablet Take 10 mg by mouth daily.    . carvedilol (COREG) 25 MG tablet Take 1 tablet (25 mg total) by mouth 2 (two) times daily with a meal. 180 tablet 3  . Cholecalciferol (VITAMIN D3) 125 MCG (5000 UT) CAPS Take 5,000 Units by mouth daily.    . diclofenac Sodium (VOLTAREN) 1 % GEL Apply  2 g topically 4 (four) times daily. 100 g 2  . glipiZIDE (GLUCOTROL) 5 MG tablet Take 1 tablet (5 mg total) by mouth daily before breakfast. 90 tablet 3  . hydrochlorothiazide (HYDRODIURIL) 25 MG tablet TAKE 1 TABLET DAILY. (Patient taking differently: Take 12.5 mg by mouth daily.) 90 tablet 0  . insulin aspart (NOVOLOG) 100 UNIT/ML injection Inject into the skin. Inject under the skin 3 (three) times a day before meals Sliding scale, Daughters insulin, using while sugar is very out of wack from cancer treatments, temporary    . insulin detemir (LEVEMIR) 100 UNIT/ML injection Inject into the skin. Inject 15 units under the skin every night at night, temporary, daughter's insulin    . insulin lispro (INSULIN LISPRO) 100 UNIT/ML KwikPen Junior Inject into the skin 3 (three) times daily as needed (Above 250). Sliding scale    . losartan (COZAAR) 100 MG tablet Take 1 tablet by mouth daily. 90 tablet 0  . lovastatin (MEVACOR) 40 MG tablet Take 1 tablet (40 mg total) by mouth at bedtime. 60 tablet 0  . metFORMIN (GLUCOPHAGE-XR) 500 MG 24 hr tablet Take 500 mg by mouth 2 (two) times daily. 2 QAM, 2 QHS    .  prochlorperazine (COMPAZINE) 10 MG tablet Take 1 tablet (10 mg total) by mouth every 6 (six) hours as needed for nausea or vomiting. 30 tablet 0  . vitamin B-12 (CYANOCOBALAMIN) 1000 MCG tablet Take 1 tablet (1,000 mcg total) by mouth daily. 90 tablet 0  . ofloxacin (OCUFLOX) 0.3 % ophthalmic solution Dry eye     No current facility-administered medications for this visit.   Facility-Administered Medications Ordered in Other Visits  Medication Dose Route Frequency Provider Last Rate Last Admin  . sodium chloride flush (NS) 0.9 % injection 10 mL  10 mL Intravenous PRN Henry Server, MD   10 mL at 01/22/20 0831     PHYSICAL EXAMINATION: ECOG PERFORMANCE STATUS: 1 - Symptomatic but completely ambulatory Vitals:   10/14/20 0859  BP: 95/66  Pulse: 73  Resp: 18  Temp: 97.8 F (36.6 C)   Filed Weights   10/14/20 0859  Weight: 245 lb 4.8 oz (111.3 kg)    Physical Exam Constitutional:      General: He is not in acute distress.    Appearance: He is obese.  HENT:     Head: Normocephalic and atraumatic.  Eyes:     General: No scleral icterus. Cardiovascular:     Rate and Rhythm: Normal rate and regular rhythm.     Heart sounds: Normal heart sounds.  Pulmonary:     Effort: Pulmonary effort is normal. No respiratory distress.     Breath sounds: No wheezing.  Abdominal:     General: Bowel sounds are normal. There is no distension.     Palpations: Abdomen is soft.  Musculoskeletal:        General: No deformity. Normal range of motion.     Cervical back: Normal range of motion and neck supple.  Skin:    General: Skin is warm and dry.     Findings: No erythema or rash.  Neurological:     Mental Status: He is alert and oriented to person, place, and time. Mental status is at baseline.     Cranial Nerves: No cranial nerve deficit.     Coordination: Coordination normal.  Psychiatric:        Mood and Affect: Mood normal.     LABORATORY DATA:  I have reviewed the data  as listed Lab  Results  Component Value Date   WBC 2.2 (L) 10/14/2020   HGB 9.9 (L) 10/14/2020   HCT 29.3 (L) 10/14/2020   MCV 92.1 10/14/2020   PLT 57 (L) 10/14/2020   Recent Labs    04/01/20 0837 04/15/20 0853 04/29/20 0849 05/21/20 1253 09/16/20 0849 09/30/20 0814 10/14/20 0835  NA 139 135 138   < > 138 136 137  K 4.3 4.3 4.2   < > 4.3 4.5 4.6  CL 107 104 108   < > 109 105 107  CO2 21* 21* 23   < > 19* 20* 20*  GLUCOSE 123* 162* 109*   < > 112* 89 145*  BUN 24* 22* 20   < > 25* 28* 30*  CREATININE 1.69* 1.87* 1.58*   < > 1.67* 1.68* 1.94*  CALCIUM 8.5* 8.6* 8.7*   < > 8.7* 9.5 9.1  GFRNONAA 44* 39* 48*   < > 47* 47* 40*  GFRAA 51* 45* 55*  --   --   --   --   PROT 7.9 7.8 8.0   < > 7.9 8.0 8.0  ALBUMIN 4.0 4.0 4.3   < > 3.8 4.1 3.9  AST 31 34 29   < > 34 36 39  ALT _0 < > 31 27 37  ALKPHOS 102 117 108   < > 117 113 120  BILITOT 0.9 0.8 1.5*   < > 0.9 1.1 1.1   < > = values in this interval not displayed.   Iron/TIBC/Ferritin/ %Sat    Component Value Date/Time   IRON 60 09/02/2020 0822   IRON 105 10/15/2018 1325   TIBC 293 09/02/2020 0822   TIBC 244 (L) 10/15/2018 1325   FERRITIN 164 09/02/2020 0822   FERRITIN 588 (H) 10/15/2018 1325   IRONPCTSAT 21 09/02/2020 0822   IRONPCTSAT 43 10/15/2018 1325      RADIOGRAPHIC STUDIES: I have personally reviewed the radiological images as listed and agreed with the findings in the report. CT CHEST ABDOMEN PELVIS WO CONTRAST  Result Date: 10/12/2020 CLINICAL DATA:  RCC follow-up, status post left nephrectomy, on immunotherapy EXAM: CT CHEST, ABDOMEN AND PELVIS WITHOUT CONTRAST TECHNIQUE: Multidetector CT imaging of the chest, abdomen and pelvis was performed following the standard protocol without IV contrast. COMPARISON:  07/16/2020 FINDINGS: CT CHEST FINDINGS Cardiovascular: Heart is normal in size.  No pericardial effusion. No evidence of thoracic aortic aneurysm. Atherosclerotic calcifications of the aortic arch. Three vessel  coronary atherosclerosis. Right chest port terminates at the cavoatrial junction. Mediastinum/Nodes: No suspicious mediastinal lymphadenopathy. Visualized thyroid is unremarkable. Lungs/Pleura: Faint subpleural ground-glass opacities/reticulation in the lungs bilaterally (for example, series 4/image 43), improved from the prior, favoring post infectious/inflammatory scarring related to prior atypical infection such as COVID. No suspicious pulmonary nodules. Specifically, the prior right perihilar nodule/lymph node has resolved. No focal consolidation. No pleural effusion or pneumothorax. Musculoskeletal: Cervical spine fixation hardware, incompletely visualized. Degenerative changes of the thoracic spine. 15 mm lytic lesion in the right T9 vertebral body (series 2/image 41), new, suspicious. CT ABDOMEN PELVIS FINDINGS Hepatobiliary: Cirrhosis.  No focal hepatic lesion is seen. Suspected tiny layering gallstone (series 2/image 67), without associated inflammatory changes. No intrahepatic or extrahepatic duct dilatation. Pancreas: Within normal limits. Spleen: Enlarged, measuring 20.3 cm in maximal craniocaudal dimension. Adrenals/Urinary Tract: Adrenal glands are within normal limits. Status post left nephrectomy. No abnormal soft tissue in the surgical bed. Right kidney is within normal limits. No renal  calculi or hydronephrosis. Bladder is within normal limits. Stomach/Bowel: Stomach is within normal limits. No evidence of bowel obstruction. Normal appendix (series 2/image 96). No colonic wall thickening or inflammatory changes. Vascular/Lymphatic: Side aneurysm Atherosclerotic calcifications of the abdominal aorta and branch vessels. No suspicious abdominopelvic lymphadenopathy. Reproductive: Prostate is unremarkable. Penile prosthesis. Other: Minimal perihepatic ascites. Mild mesenteric stranding with trace left pelvic ascites (series 2/image 111). Musculoskeletal: Mild degenerative changes of the lumbar spine.  Tiny lytic lesion along the inferior aspect of the L3 vertebral body (series 6/image 149), unchanged. IMPRESSION: Status post left nephrectomy. New 15 mm lytic lesion in the right T9 vertebral body, suspicious for isolated lytic metastasis. No findings suspicious for soft tissue metastases in the chest, abdomen, or pelvis. Residual post infectious inflammatory scarring in the lungs bilaterally. Cirrhosis.  Splenomegaly.  Minimal abdominopelvic ascites. Additional ancillary findings as above. Electronically Signed   By: Julian Hy M.D.   On: 10/12/2020 12:27     ASSESSMENT & PLAN:  1. Renal cell carcinoma of left kidney (HCC)   2. Anemia due to stage 3b chronic kidney disease ([place])   3. Encounter for antineoplastic immunotherapy   4. 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 New 15 mm lytic lesion in the right T9 vertebral body.  Suspicious for isolated lytic metastasis.  No findings of suspicious soft tissue metastasis in the chest abdomen or pelvis.  Residual postinfectious inflammatory scarring in the lung bilaterally.  Cirrhosis.  Splenomegaly.  Minimal ascites. I recommend patient to obtain bone scan for further evaluation of the T9 vertebral body. Counts are stable. Proceed with nivolumab treatment today.  #Orthostatic hypotension. Patient received 1 L of normal saline for hydration.  Blood pressure improved.  Encourage oral hydration.  #Chronic thrombocytopenia and neutropenia,  due to liver cirrhosis Platelet and ANC counts are slightly decreased.  Continue monitor.  #Chronic kidney disease, kidney function increased to 1.94.  IV fluid hydration session today.   Encourage oral hydration and avoid nephrotoxin.  #Anemia, chronic, hemoglobin is 9.9, monitor Iron panel showed ferritin of 164, iron saturation 21.  Stable.  #Diabetes, hypoglycemia episodes.   Recommend patient to hold off Metformin.  Continue diabetic diet and monitor his glucose level. He is overdue following up with gastroenterology for liver cirrhosis.  Advised patient to make a follow-up appointment. ?  Brittle diabetes vs hypoglycemia due to liver cirrhosis. Follow-up 2 week lab MD for assessment evaluation prior to nivolumab. All questions were answered. The patient knows to call the clinic with any problems questions or concerns.   Henry Server, MD, PhD Hematology Oncology [facility] Pager- [phone] 10/14/2020

## 2020-10-14 NOTE — Progress Notes (Signed)
Blood Pressure and Pulse Rate readings post 0.9% Sodium Chloride infusion are as follows; Blood Pressure Sitting: 108/58, Pulse Rate: 67; Blood Pressure Lying: 102/61, Pulse Rate: 65; Blood Pressure Standing: 103/57, Pulse Rate: 72. MD, Dr. Tasia Catchings, notified and aware. Per MD order: proceed with scheduled Nivolumab treatment at this time.

## 2020-10-14 NOTE — Progress Notes (Signed)
Patient here for follow up. Pt has seen eye doctor due to styes in both eyes. Problem has now resolved.

## 2020-10-26 ENCOUNTER — Other Ambulatory Visit: Payer: Self-pay

## 2020-10-26 ENCOUNTER — Encounter
Admission: RE | Admit: 2020-10-26 | Discharge: 2020-10-26 | Disposition: A | Discharge status: Home / Self Care | Payer: Medicare Other | Source: Ambulatory Visit | Attending: Oncology | Admitting: Oncology

## 2020-10-26 ENCOUNTER — Ambulatory Visit
Admission: RE | Admit: 2020-10-26 | Discharge: 2020-10-26 | Disposition: A | Discharge status: Home / Self Care | Payer: Medicare Other | Source: Ambulatory Visit | Attending: Oncology | Admitting: Oncology

## 2020-10-26 DIAGNOSIS — M899 Disorder of bone, unspecified: Secondary | ICD-10-CM | POA: Insufficient documentation

## 2020-10-26 MED ORDER — TECHNETIUM TC 99M MEDRONATE IV KIT
20.0000 | PACK | Freq: Once | INTRAVENOUS | Status: AC | PRN
  Administered 2020-10-26: 21.691 via INTRAVENOUS

## 2020-10-28 ENCOUNTER — Encounter: Payer: Self-pay | Admitting: Oncology

## 2020-10-28 ENCOUNTER — Inpatient Hospital Stay (HOSPITAL_BASED_OUTPATIENT_CLINIC_OR_DEPARTMENT_OTHER): Payer: Medicare Other | Admitting: Oncology

## 2020-10-28 ENCOUNTER — Other Ambulatory Visit: Payer: Self-pay

## 2020-10-28 ENCOUNTER — Inpatient Hospital Stay: Payer: Medicare Other

## 2020-10-28 VITALS — BP 111/72 | HR 75 | Temp 97.4°F | Resp 18 | Wt 250.6 lb

## 2020-10-28 DIAGNOSIS — C642 Malignant neoplasm of left kidney, except renal pelvis: Secondary | ICD-10-CM

## 2020-10-28 DIAGNOSIS — M899 Disorder of bone, unspecified: Secondary | ICD-10-CM

## 2020-10-28 DIAGNOSIS — D696 Thrombocytopenia, unspecified: Secondary | ICD-10-CM | POA: Diagnosis not present

## 2020-10-28 DIAGNOSIS — Z5112 Encounter for antineoplastic immunotherapy: Secondary | ICD-10-CM

## 2020-10-28 DIAGNOSIS — D708 Other neutropenia: Secondary | ICD-10-CM

## 2020-10-28 DIAGNOSIS — C649 Malignant neoplasm of unspecified kidney, except renal pelvis: Secondary | ICD-10-CM

## 2020-10-28 LAB — COMPREHENSIVE METABOLIC PANEL
ALT: 25 U/L (ref 0–44)
AST: 32 U/L (ref 15–41)
Albumin: 3.8 g/dL (ref 3.5–5.0)
Alkaline Phosphatase: 96 U/L (ref 38–126)
Anion gap: 11 (ref 5–15)
BUN: 26 mg/dL — ABNORMAL HIGH (ref 6–20)
CO2: 20 mmol/L — ABNORMAL LOW (ref 22–32)
Calcium: 8.9 mg/dL (ref 8.9–10.3)
Chloride: 106 mmol/L (ref 98–111)
Creatinine, Ser: 1.9 mg/dL — ABNORMAL HIGH (ref 0.61–1.24)
GFR, Estimated: 41 mL/min — ABNORMAL LOW (ref >60)
Glucose, Bld: 122 mg/dL — ABNORMAL HIGH (ref 70–99)
Potassium: 4.3 mmol/L (ref 3.5–5.1)
Sodium: 137 mmol/L (ref 135–145)
Total Bilirubin: 0.9 mg/dL (ref 0.3–1.2)
Total Protein: 7.5 g/dL (ref 6.5–8.1)

## 2020-10-28 LAB — CBC WITH DIFFERENTIAL/PLATELET
Abs Immature Granulocytes: 0 10*3/uL (ref 0.00–0.07)
Basophils Absolute: 0 10*3/uL (ref 0.0–0.1)
Basophils Relative: 2 %
Eosinophils Absolute: 0.1 10*3/uL (ref 0.0–0.5)
Eosinophils Relative: 6 %
HCT: 27.1 % — ABNORMAL LOW (ref 39.0–52.0)
Hemoglobin: 9.3 g/dL — ABNORMAL LOW (ref 13.0–17.0)
Immature Granulocytes: 0 %
Lymphocytes Relative: 16 %
Lymphs Abs: 0.3 10*3/uL — ABNORMAL LOW (ref 0.7–4.0)
MCH: 31.4 pg (ref 26.0–34.0)
MCHC: 34.3 g/dL (ref 30.0–36.0)
MCV: 91.6 fL (ref 80.0–100.0)
Monocytes Absolute: 0.3 10*3/uL (ref 0.1–1.0)
Monocytes Relative: 17 %
Neutro Abs: 1.1 10*3/uL — ABNORMAL LOW (ref 1.7–7.7)
Neutrophils Relative %: 59 %
Platelets: 58 10*3/uL — ABNORMAL LOW (ref 150–400)
RBC: 2.96 MIL/uL — ABNORMAL LOW (ref 4.22–5.81)
RDW: 13.4 % (ref 11.5–15.5)
WBC: 1.9 10*3/uL — ABNORMAL LOW (ref 4.0–10.5)
nRBC: 0 % (ref 0.0–0.2)

## 2020-10-28 LAB — TSH: TSH: 4.129 u[IU]/mL (ref 0.350–4.500)

## 2020-10-28 MED ORDER — SODIUM CHLORIDE 0.9 % IV SOLN
240.0000 mg | Freq: Once | INTRAVENOUS | Status: AC
  Administered 2020-10-28: 240 mg via INTRAVENOUS
  Filled 2020-10-28: qty 24

## 2020-10-28 MED ORDER — HEPARIN SOD (PORK) LOCK FLUSH 100 UNIT/ML IV SOLN
500.0000 [IU] | Freq: Once | INTRAVENOUS | Status: AC
  Administered 2020-10-28: 500 [IU] via INTRAVENOUS
  Filled 2020-10-28: qty 5

## 2020-10-28 MED ORDER — HEPARIN SOD (PORK) LOCK FLUSH 100 UNIT/ML IV SOLN
INTRAVENOUS | Status: AC
  Filled 2020-10-28: qty 5

## 2020-10-28 MED ORDER — SODIUM CHLORIDE 0.9% FLUSH
10.0000 mL | INTRAVENOUS | Status: DC | PRN
  Administered 2020-10-28: 10 mL via INTRAVENOUS
  Filled 2020-10-28: qty 10

## 2020-10-28 MED ORDER — SODIUM CHLORIDE 0.9 % IV SOLN
Freq: Once | INTRAVENOUS | Status: AC
  Filled 2020-10-28: qty 250

## 2020-10-28 NOTE — Progress Notes (Signed)
Hematology/Oncology follow-up [facility] [phone] [phone]   Patient Care Team: Albina Billet, MD as PCP - General (Internal Medicine) Earlie Server, MD as Consulting Physician (Hematology and Oncology)  REFERRING PROVIDER: Albina Billet, MD  CHIEF COMPLAINTS/REASON FOR VISIT:  Follow-up for kidney cancer treatments  HISTORY OF PRESENTING ILLNESS:   Henry Moore is a  60 y.o.  male with PMH listed below was seen in consultation at the request of  Albina Billet, MD  for evaluation of kidney cancer Extensive medical records were reviewed Patient had MRI lumbar spine without contrast done on 07/13/2018 which showed mired lumbar degenerative disease. CT thoracic spine was done on 08/05/2018 which showed 5 cm left renal mass. 09/04/2018 CT abdomen and pelvis with and without contrast showed 5 cm round enhancing solid mass in the left kidney.  No involvement of left renal vein.  No lymphadenopathy Morphologic changes consistent with cirrhosis and the portal hypertension.  Small volume ascites and splenomegaly. . 10/16/2018 Left nephrectomy showed renal cell carcinoma, clear-cell type, nuclear grade 4, tumor extends into the renal vein and renal sinus fat.  Urethral, vascular and or resection margins are negative for tumor pT3a Nx Mx  01/28/2019 patient had another CT chest abdomen pelvis done with contrast Showed interval left nephrectomy.  Scattered tiny pulmonary nodules bilaterally.  Nonspecific.  No other evidence of metastatic disease.  Cirrhosis changes extensive coronary and aortic atherosclerosis  07/14/2019 patient underwent surveillance CT chest abdomen pelvis without contrast Interval progression of pulmonary metastasis with new enlarged right paratracheal lymph node 1.7 cm, previously 0.6 cm one-point since worrisome for metastatic adenopathy.  Multiple progressive pulmonary nodules, index nodule within the lingula measures 1.3 cm, previously  3 mm. Index subpleural nodule within the anterior right upper lobe measuring 1.8 cm, previously 3 cm Index nodule within the post lateral right lower lobe measuring 5 mm, new from previous care Aortic sclerosis.  Three-vessel coronary artery calcification noted.  Cirrhosis changes.  Splenomegaly.  Patient was referred to establish care with medical oncology for further discussion and evaluation of metastatic renal cell carcinoma. Patient reports feeling well at baseline today.  Denies any shortness of breath, cough, hemoptysis, back pain, headache. He quitted smoking 2015.  Not currently actively drinking alcohol as well. Patient has chronic back pain unchanged.  #08/14/2019-10/16/2019 nivolumab and ipilimumab. #11/06/2019-nivolumab every 2 weeks maintenance.  # Liver cirrhosis with small amount of ascites/splenomegaly patient sees gastroenterology Dr. [name].  . He will get ultrasound surveillance due in February 2021 for screening of [name].  EGD surveillance for Barrett's plan in April 2021.  # Diabetes, on Humalog sliding scale and metformin.  #Family history of cancer and personal history of RCC.  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  Status post ipilimumab and nivolumab treatment.  # 10/29/2019 CT chest abdomen pelvis without contrast images were independently reviewed by me and discussed with patient.   CT showed marked improvement with resolution of many of the pulmonary nodules, prominent reduced size of other nodules.  Considerably reduced size of the right lower paratracheal lymph node now 0.9 cm in diameter. Hepatic cirrhosis with prominent splenomegaly and trace perisplenic ascites. Chronic CT findings includes aortic atherosclerotic vascular  disease.  Mild sigmoid colon diverticulosis  # CKD, he  establish care with Dr. Candiss Norse for chronic kidney disease.  Blood pressure medication has been adjusted.  Hydrochlorothiazide decreased to 12.5 mg daily.  # NGS -foundation one PD-L1 TPS 0% no reportable mutation, MS stable. TMB 8 muts/mb VHL D120fs*16, SETD2 BAP Genetic testing is negative.   # 07/16/2020, CT chest abdomen pelvis images were reviewed and discussed with patient Compared to July 2021 image, he has had a new patchy groundglass opacities throughout both lungs with central part of solids/airspace components.  Findings are nonspecific and could be secondary to an atypical infection, including viral pneumonia or inflammation.  Patient is asymptomatic.  I wonder the changes are secondary to his Covid infection.  Patient has been started on Levaquin to cover atypical infection by on-call physician and I recommend him to continue and complete the course. Proceed with today's immunotherapy nivolumab.  Advised patient to notify me if his symptoms get worse.  # 10/12/2020 CT chest abdomen pelvis with out contrast. New 15 mm lytic lesion in the right T9 vertebral body.  Suspicious for isolated lytic metastasis.  No findings of suspicious soft tissue metastasis in the chest abdomen or pelvis.  Residual postinfectious inflammatory scarring in the lung bilaterally.  Cirrhosis.  Splenomegaly.  Minimal ascites.   INTERVAL HISTORY Henry Moore is a 60 y.o. male who has above history reviewed by me today presents for follow up visit for management of stage IV RCC  Problems and complaints are listed below: BP has improved.  Patient stopped taking hydrochlorothiazide.  Chronic back unchanged.      Review of Systems  Constitutional: Positive for fatigue. Negative for appetite change, chills, fever and unexpected weight change.  HENT:   Negative for hearing loss and voice change.   Eyes: Negative for eye problems and icterus.  Respiratory:  Negative for chest tightness, cough and shortness of breath.   Cardiovascular: Negative for chest pain and leg swelling.  Gastrointestinal: Negative for abdominal distention and abdominal pain.  Endocrine: Negative for hot flashes.  Genitourinary: Negative for difficulty urinating, dysuria and frequency.   Musculoskeletal: Positive for arthralgias.       Chronic back pain  Skin: Negative for itching and rash.       Hair loss  Neurological: Negative for light-headedness and numbness.  Hematological: Negative for adenopathy. Does not bruise/bleed easily.  Psychiatric/Behavioral: Negative for confusion.    MEDICAL HISTORY:  Past Medical History:  Diagnosis Date  . Cough    SINUS DRAINAGE CAUSING COUGHING , REPORTS CLEAR MUCOUS   . Diabetes mellitus without complication ([place])   . Family history of breast cancer   . Family history of colon cancer   . Family history of stomach cancer   . HTN (hypertension)   . Hyperlipemia   . Left renal mass   . Platelets decreased ([place])    DENIES UNSUAL BLEEDING   . PONV (postoperative nausea and vomiting)   . Renal cell carcinoma ([place]) 08/04/2019  . WBC decreased     SURGICAL HISTORY: Past Surgical History:  Procedure Laterality Date  . ANKLE SURGERY Left   . ANTERIOR CERVICAL DECOMP/DISCECTOMY FUSION N/A 04/16/2014   Procedure: ANTERIOR CERVICAL DECOMPRESSION/DISCECTOMY FUSION  (ACDF C5-C7)   (2 LEVELS)     ;  Surgeon: Melina Schools, MD;  Location: [place];  Service: Orthopedics;  Laterality: N/A;  . APPENDECTOMY    . BACK SURGERY    . ESOPHAGOGASTRODUODENOSCOPY (EGD) WITH PROPOFOL N/A 02/06/2019   Procedure: ESOPHAGOGASTRODUODENOSCOPY (EGD) WITH PROPOFOL;  Surgeon: [name], MD;  Location: [facility] ENDOSCOPY;  Service: Gastroenterology;  Laterality: N/A;  . ESOPHAGOGASTRODUODENOSCOPY (EGD) WITH PROPOFOL N/A 03/04/2019   Procedure: ESOPHAGOGASTRODUODENOSCOPY (EGD) WITH PROPOFOL;  Surgeon: Lin Landsman, MD;  Location: [facility] ENDOSCOPY;  Service:  Gastroenterology;  Laterality: N/A;  . ESOPHAGOGASTRODUODENOSCOPY (EGD) WITH PROPOFOL N/A 04/22/2019   Procedure: ESOPHAGOGASTRODUODENOSCOPY (EGD) WITH PROPOFOL;  Surgeon: [name], MD;  Location: [facility] ENDOSCOPY;  Service: Gastroenterology;  Laterality: N/A;  . FRACTURE SURGERY    . HYDROCELE EXCISION    . KNEE ARTHROSCOPY Bilateral   . LAPAROSCOPIC NEPHRECTOMY, HAND ASSISTED Left 10/16/2018   Procedure: LEFT HAND ASSISTED LAPAROSCOPIC NEPHRECTOMY;  Surgeon: Lucas Mallow, MD;  Location: [facility];  Service: Urology;  Laterality: Left;  . NASAL SINUS SURGERY     X 2  . 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 PORTA CATH INSERTION N/A 11/11/2019   Procedure: PORTA CATH INSERTION;  Surgeon: Katha Cabal, MD;  Location: [place] [facility];  Service: Cardiovascular;  Laterality: N/A;  . SHOULDER SURGERY Left     SOCIAL HISTORY: Social History   Socioeconomic History  . Marital status: Married    Spouse name: Not on file  . Number of children: Not on file  . Years of education: Not on file  . Highest education level: Not on file  Occupational History  . Not on file  Tobacco Use  . Smoking status: Former Smoker    Packs/day: 0.25    Years: 20.00    Pack years: 5.00    Quit date: 2015    Years since quitting: 7.2  . Smokeless tobacco: Never Used  Vaping Use  . Vaping Use: Never used  Substance and Sexual Activity  . Alcohol use: Not Currently    Alcohol/week: 0.0 standard drinks    Comment: no alchol in 1 year  . Drug use: No  . Sexual activity: Not on file  Other Topics Concern  . Not on file  Social History Narrative  . Not on file   Social Determinants of Health   Financial Resource Strain: Not on file  Food Insecurity: Not on file  Transportation Needs: Not on file  Physical Activity: Not on file  Stress: Not on file  Social Connections: Not on file  Intimate Partner Violence: Not on file    FAMILY HISTORY: Family History  Problem Relation Age of  Onset  . Diabetes Mother   . Cancer Mother        neuroendocrine cancer  . Cancer Father        neuroendocrine cancer of meninges  . Cancer Maternal Grandmother        tomach dx 68  . Alzheimer's disease Paternal Grandmother   . Lung cancer Paternal Grandfather   . Lung cancer Maternal Aunt   . Colon cancer Maternal Uncle   . Breast cancer Paternal Aunt        both dx 37s  . Ovarian cancer Other        dx 8  . Breast cancer Cousin     ALLERGIES:  is allergic to codeine and mucinex [guaifenesin er].  MEDICATIONS:  Current Outpatient Medications  Medication Sig Dispense Refill  . ACCU-CHEK AVIVA PLUS test strip CHECK BL00D SUGAR ONCE OR TWICE DAILY. 100 each PRN  . amLODipine (NORVASC) 10 MG tablet Take 10 mg by mouth daily.    . calcium carbonate (OS-CAL - DOSED IN MG OF ELEMENTAL CALCIUM) 1250 (500 Ca) MG tablet Take 1 tablet by mouth.    Marland Kitchen  carvedilol (COREG) 25 MG tablet Take 1 tablet (25 mg total) by mouth 2 (two) times daily with a meal. 180 tablet 3  . Cholecalciferol (VITAMIN D3) 125 MCG (5000 UT) CAPS Take 5,000 Units by mouth daily.    . diclofenac Sodium (VOLTAREN) 1 % GEL Apply 2 g topically 4 (four) times daily. 100 g 2  . glipiZIDE (GLUCOTROL) 5 MG tablet Take 1 tablet (5 mg total) by mouth daily before breakfast. 90 tablet 3  . losartan (COZAAR) 100 MG tablet Take 1 tablet by mouth daily. 90 tablet 0  . lovastatin (MEVACOR) 40 MG tablet Take 1 tablet (40 mg total) by mouth at bedtime. 60 tablet 0  . metFORMIN (GLUCOPHAGE-XR) 500 MG 24 hr tablet Take 500 mg by mouth 2 (two) times daily. 2 QAM, 2 QHS    . ofloxacin (OCUFLOX) 0.3 % ophthalmic solution Dry eye    . prochlorperazine (COMPAZINE) 10 MG tablet Take 1 tablet (10 mg total) by mouth every 6 (six) hours as needed for nausea or vomiting. 30 tablet 0  . vitamin B-12 (CYANOCOBALAMIN) 1000 MCG tablet Take 1 tablet (1,000 mcg total) by mouth daily. 90 tablet 0  . hydrochlorothiazide (HYDRODIURIL) 25 MG tablet TAKE 1  TABLET DAILY. (Patient not taking: Reported on 10/28/2020) 90 tablet 0  . insulin aspart (NOVOLOG) 100 UNIT/ML injection Inject into the skin. Inject under the skin 3 (three) times a day before meals Sliding scale, Daughters insulin, using while sugar is very out of wack from cancer treatments, temporary (Patient not taking: Reported on 10/28/2020)    . insulin detemir (LEVEMIR) 100 UNIT/ML injection Inject into the skin. Inject 15 units under the skin every night at night, temporary, daughter's insulin (Patient not taking: Reported on 10/28/2020)    . insulin lispro (INSULIN LISPRO) 100 UNIT/ML KwikPen Junior Inject into the skin 3 (three) times daily as needed (Above 250). Sliding scale (Patient not taking: Reported on 10/28/2020)     No current facility-administered medications for this visit.   Facility-Administered Medications Ordered in Other Visits  Medication Dose Route Frequency Provider Last Rate Last Admin  . sodium chloride flush (NS) 0.9 % injection 10 mL  10 mL Intravenous PRN Earlie Server, MD   10 mL at 01/22/20 0831     PHYSICAL EXAMINATION: ECOG PERFORMANCE STATUS: 1 - Symptomatic but completely ambulatory Vitals:   10/28/20 0915  BP: 111/72  Pulse: 75  Resp: 18  Temp: (!) 97.4 F (36.3 C)   Filed Weights   10/28/20 0915  Weight: 250 lb 9.6 oz (113.7 kg)    Physical Exam Constitutional:      General: He is not in acute distress.    Appearance: He is obese.  HENT:     Head: Normocephalic and atraumatic.  Eyes:     General: No scleral icterus. Cardiovascular:     Rate and Rhythm: Normal rate and regular rhythm.     Heart sounds: Normal heart sounds.  Pulmonary:     Effort: Pulmonary effort is normal. No respiratory distress.     Breath sounds: No wheezing.  Abdominal:     General: Bowel sounds are normal. There is no distension.     Palpations: Abdomen is soft.  Musculoskeletal:        General: No deformity. Normal range of motion.     Cervical back: Normal range  of motion and neck supple.  Skin:    General: Skin is warm and dry.     Findings: No erythema or  rash.  Neurological:     Mental Status: He is alert and oriented to person, place, and time. Mental status is at baseline.     Cranial Nerves: No cranial nerve deficit.     Coordination: Coordination normal.  Psychiatric:        Mood and Affect: Mood normal.     LABORATORY DATA:  I have reviewed the data as listed Lab Results  Component Value Date   WBC 1.9 (L) 10/28/2020   HGB 9.3 (L) 10/28/2020   HCT 27.1 (L) 10/28/2020   MCV 91.6 10/28/2020   PLT 58 (L) 10/28/2020   Recent Labs    04/01/20 0837 04/15/20 0853 04/29/20 0849 05/21/20 1253 09/30/20 0814 10/14/20 0835 10/28/20 0838  NA 139 135 138   < > 136 137 137  K 4.3 4.3 4.2   < > 4.5 4.6 4.3  CL 107 104 108   < > 105 107 106  CO2 21* 21* 23   < > 20* 20* 20*  GLUCOSE 123* 162* 109*   < > 89 145* 122*  BUN 24* 22* 20   < > 28* 30* 26*  CREATININE 1.69* 1.87* 1.58*   < > 1.68* 1.94* 1.90*  CALCIUM 8.5* 8.6* 8.7*   < > 9.5 9.1 8.9  GFRNONAA 44* 39* 48*   < > 47* 40* 41*  GFRAA 51* 45* 55*  --   --   --   --   PROT 7.9 7.8 8.0   < > 8.0 8.0 7.5  ALBUMIN 4.0 4.0 4.3   < > 4.1 3.9 3.8  AST 31 34 29   < > 36 39 32  ALT $Re'24 27 21   'MEm$ < > 27 37 25  ALKPHOS 102 117 108   < > 113 120 96  BILITOT 0.9 0.8 1.5*   < > 1.1 1.1 0.9   < > = values in this interval not displayed.   Iron/TIBC/Ferritin/ %Sat    Component Value Date/Time   IRON 60 09/02/2020 0822   IRON 105 10/15/2018 1325   TIBC 293 09/02/2020 0822   TIBC 244 (L) 10/15/2018 1325   FERRITIN 164 09/02/2020 0822   FERRITIN 588 (H) 10/15/2018 1325   IRONPCTSAT 21 09/02/2020 0822   IRONPCTSAT 43 10/15/2018 1325      RADIOGRAPHIC STUDIES: I have personally reviewed the radiological images as listed and agreed with the findings in the report. NM Bone Scan Whole Body  Result Date: 10/27/2020 CLINICAL DATA:  Thoracic spine lytic lesion. History of left nephrectomy.  EXAM: NUCLEAR MEDICINE WHOLE BODY BONE SCAN TECHNIQUE: Whole body anterior and posterior images were obtained approximately 3 hours after intravenous injection of radiopharmaceutical. RADIOPHARMACEUTICALS:  21.7 mCi Technetium-65m MDP IV COMPARISON:  CT 10/12/2020.  Bone scan 08/11/2019. FINDINGS: Prior left nephrectomy. Right renal function and excretion. Minimal increased activity noted at the costovertebral junction on the right at T9 is most likely degenerative. Similar finding noted on prior study of 08/11/2019. No definite T9 vertebral body abnormal activity corresponding to lytic lesion noted on recent CT. Again the recently identified T9 vertebral body lytic lesion is suspicious and further evaluation of the thoracic spine with MRI can be obtained. No other bony abnormalities noted by bone scan. IMPRESSION: 1. Minimal increased activity noted at the right costovertebral junction at the T9 level is most likely degenerative. Similar finding noted on prior bone scan of 08/11/2019. No definite T9 vertebral body abnormal activity corresponding to lytic lesion noted on recent CT.  Again the recently identified T9 vertebral body lytic lesion is suspicious and further evaluation of the thoracic spine with MRI can be obtained. No other bony abnormalities noted by bone scan. 2.  Prior left nephrectomy. Electronically Signed   By: Maisie Fus  Register   On: 10/27/2020 10:27   CT CHEST ABDOMEN PELVIS WO CONTRAST  Result Date: 10/12/2020 CLINICAL DATA:  RCC follow-up, status post left nephrectomy, on immunotherapy EXAM: CT CHEST, ABDOMEN AND PELVIS WITHOUT CONTRAST TECHNIQUE: Multidetector CT imaging of the chest, abdomen and pelvis was performed following the standard protocol without IV contrast. COMPARISON:  07/16/2020 FINDINGS: CT CHEST FINDINGS Cardiovascular: Heart is normal in size.  No pericardial effusion. No evidence of thoracic aortic aneurysm. Atherosclerotic calcifications of the aortic arch. Three vessel  coronary atherosclerosis. Right chest port terminates at the cavoatrial junction. Mediastinum/Nodes: No suspicious mediastinal lymphadenopathy. Visualized thyroid is unremarkable. Lungs/Pleura: Faint subpleural ground-glass opacities/reticulation in the lungs bilaterally (for example, series 4/image 43), improved from the prior, favoring post infectious/inflammatory scarring related to prior atypical infection such as COVID. No suspicious pulmonary nodules. Specifically, the prior right perihilar nodule/lymph node has resolved. No focal consolidation. No pleural effusion or pneumothorax. Musculoskeletal: Cervical spine fixation hardware, incompletely visualized. Degenerative changes of the thoracic spine. 15 mm lytic lesion in the right T9 vertebral body (series 2/image 41), new, suspicious. CT ABDOMEN PELVIS FINDINGS Hepatobiliary: Cirrhosis.  No focal hepatic lesion is seen. Suspected tiny layering gallstone (series 2/image 67), without associated inflammatory changes. No intrahepatic or extrahepatic duct dilatation. Pancreas: Within normal limits. Spleen: Enlarged, measuring 20.3 cm in maximal craniocaudal dimension. Adrenals/Urinary Tract: Adrenal glands are within normal limits. Status post left nephrectomy. No abnormal soft tissue in the surgical bed. Right kidney is within normal limits. No renal calculi or hydronephrosis. Bladder is within normal limits. Stomach/Bowel: Stomach is within normal limits. No evidence of bowel obstruction. Normal appendix (series 2/image 96). No colonic wall thickening or inflammatory changes. Vascular/Lymphatic: Side aneurysm Atherosclerotic calcifications of the abdominal aorta and branch vessels. No suspicious abdominopelvic lymphadenopathy. Reproductive: Prostate is unremarkable. Penile prosthesis. Other: Minimal perihepatic ascites. Mild mesenteric stranding with trace left pelvic ascites (series 2/image 111). Musculoskeletal: Mild degenerative changes of the lumbar spine.  Tiny lytic lesion along the inferior aspect of the L3 vertebral body (series 6/image 149), unchanged. IMPRESSION: Status post left nephrectomy. New 15 mm lytic lesion in the right T9 vertebral body, suspicious for isolated lytic metastasis. No findings suspicious for soft tissue metastases in the chest, abdomen, or pelvis. Residual post infectious inflammatory scarring in the lungs bilaterally. Cirrhosis.  Splenomegaly.  Minimal abdominopelvic ascites. Additional ancillary findings as above. Electronically Signed   By: Charline Bills M.D.   On: 10/12/2020 12:27     ASSESSMENT & PLAN:  1. Renal cell carcinoma of left kidney (HCC)   2. Encounter for antineoplastic immunotherapy   3. Bone lesion   4. Thrombocytopenia (HCC)   5. 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 10/26/20 Bone scan showed Minimal increased activity noted at the right costovertebral junction at the T9 level is most likely degenerative. Similar finding noted on prior bone scan of 08/11/2019. No definite T9 vertebral body abnormal activity corresponding to lytic lesion noted on recent CT. Again the recently identified T9 vertebral body lytic lesion is suspicious and further evaluation of the thoracic spine with MRI can be obtained. No other bony abnormalities noted by bone scan. I will discuss his case  on Tumor board.  Proceed with nivolumab treatment today.  #Orthostatic hypotension.resoleved.  #Chronic thrombocytopenia and neutropenia,  due to liver cirrhosis Platelet and ANC counts are slightly decreased.  Monitor.   #Chronic kidney disease, stable.   Encourage oral hydration and avoid nephrotoxin.  #Anemia, chronic, hemoglobin is 9.3, monitor Iron panel showed ferritin of 164, iron saturation 21. Monitor.   #Diabetes, glucose is 122, stable. He is off metformin.  He is overdue following up with gastroenterology for liver cirrhosis.   Advised patient to make a follow-up appointment.?  Brittle diabetes vs hypoglycemia due to liver cirrhosis.  Follow-up 2 week lab MD for assessment evaluation prior to nivolumab. All questions were answered. The patient knows to call the clinic with any problems questions or concerns.   Earlie Server, MD, PhD Hematology Oncology [facility] Pager- [phone] 10/28/2020

## 2020-10-28 NOTE — Progress Notes (Signed)
Pt here for follow up. Pt has stopped taking hydrochlorothiazide since last visit, because he was having low BP.

## 2020-11-04 ENCOUNTER — Other Ambulatory Visit: Payer: BC Managed Care – PPO

## 2020-11-04 NOTE — Progress Notes (Signed)
Tumor Board Documentation  Matheu Ploeger Borland was presented by Dr Tasia Catchings at our Tumor Board on 11/04/2020, which included representatives from medical oncology,radiation oncology,internal medicine,navigation,pathology,radiology,pharmacy,genetics,research,palliative care.  Henry Moore currently presents as a current patient,for discussion with history of the following treatments: immunotherapy,active survellience.  Additionally, we reviewed previous medical and familial history, history of present illness, and recent lab results along with all available histopathologic and imaging studies. The tumor board considered available treatment options and made the following recommendations: Additional screening,Radiation therapy (primary modality) (MRI of T spine)    The following procedures/referrals were also placed: No orders of the defined types were placed in this encounter.   Clinical Trial Status: not discussed   Staging used: AJCC Stage Group  AJCC Staging: T: 3a N: x M: x Group: Stage IV Renal Cell Carcinoma   National site-specific guidelines NCCN were discussed with respect to the case.  Tumor board is a meeting of clinicians from various specialty areas who evaluate and discuss patients for whom a multidisciplinary approach is being considered. Final determinations in the plan of care are those of the provider(s). The responsibility for follow up of recommendations given during tumor board is that of the provider.   Today's extended care, comprehensive team conference, Sevan was not present for the discussion and was not examined.   Multidisciplinary Tumor Board is a multidisciplinary case peer review process.  Decisions discussed in the Multidisciplinary Tumor Board reflect the opinions of the specialists present at the conference without having examined the patient.  Ultimately, treatment and diagnostic decisions rest with the primary provider(s) and the patient.

## 2020-11-11 ENCOUNTER — Inpatient Hospital Stay (HOSPITAL_BASED_OUTPATIENT_CLINIC_OR_DEPARTMENT_OTHER): Payer: Medicare Other | Admitting: Oncology

## 2020-11-11 ENCOUNTER — Other Ambulatory Visit: Payer: Self-pay

## 2020-11-11 ENCOUNTER — Inpatient Hospital Stay: Payer: Medicare Other

## 2020-11-11 ENCOUNTER — Inpatient Hospital Stay: Payer: Medicare Other | Attending: Oncology

## 2020-11-11 ENCOUNTER — Encounter: Payer: Self-pay | Admitting: Oncology

## 2020-11-11 VITALS — BP 124/78 | HR 73 | Temp 97.7°F | Resp 18 | Wt 247.0 lb

## 2020-11-11 DIAGNOSIS — D709 Neutropenia, unspecified: Secondary | ICD-10-CM | POA: Diagnosis not present

## 2020-11-11 DIAGNOSIS — N1832 Chronic kidney disease, stage 3b: Secondary | ICD-10-CM | POA: Insufficient documentation

## 2020-11-11 DIAGNOSIS — M549 Dorsalgia, unspecified: Secondary | ICD-10-CM | POA: Insufficient documentation

## 2020-11-11 DIAGNOSIS — I2510 Atherosclerotic heart disease of native coronary artery without angina pectoris: Secondary | ICD-10-CM | POA: Insufficient documentation

## 2020-11-11 DIAGNOSIS — R188 Other ascites: Secondary | ICD-10-CM | POA: Diagnosis not present

## 2020-11-11 DIAGNOSIS — C642 Malignant neoplasm of left kidney, except renal pelvis: Secondary | ICD-10-CM | POA: Diagnosis present

## 2020-11-11 DIAGNOSIS — Z7984 Long term (current) use of oral hypoglycemic drugs: Secondary | ICD-10-CM | POA: Insufficient documentation

## 2020-11-11 DIAGNOSIS — Z803 Family history of malignant neoplasm of breast: Secondary | ICD-10-CM | POA: Insufficient documentation

## 2020-11-11 DIAGNOSIS — R161 Splenomegaly, not elsewhere classified: Secondary | ICD-10-CM | POA: Insufficient documentation

## 2020-11-11 DIAGNOSIS — C649 Malignant neoplasm of unspecified kidney, except renal pelvis: Secondary | ICD-10-CM

## 2020-11-11 DIAGNOSIS — Z5112 Encounter for antineoplastic immunotherapy: Secondary | ICD-10-CM | POA: Diagnosis not present

## 2020-11-11 DIAGNOSIS — K7460 Unspecified cirrhosis of liver: Secondary | ICD-10-CM | POA: Diagnosis not present

## 2020-11-11 DIAGNOSIS — D631 Anemia in chronic kidney disease: Secondary | ICD-10-CM | POA: Diagnosis not present

## 2020-11-11 DIAGNOSIS — Z794 Long term (current) use of insulin: Secondary | ICD-10-CM | POA: Insufficient documentation

## 2020-11-11 DIAGNOSIS — Z87891 Personal history of nicotine dependence: Secondary | ICD-10-CM | POA: Insufficient documentation

## 2020-11-11 DIAGNOSIS — Z79899 Other long term (current) drug therapy: Secondary | ICD-10-CM | POA: Insufficient documentation

## 2020-11-11 DIAGNOSIS — M899 Disorder of bone, unspecified: Secondary | ICD-10-CM | POA: Diagnosis not present

## 2020-11-11 DIAGNOSIS — E119 Type 2 diabetes mellitus without complications: Secondary | ICD-10-CM | POA: Insufficient documentation

## 2020-11-11 DIAGNOSIS — D696 Thrombocytopenia, unspecified: Secondary | ICD-10-CM | POA: Diagnosis not present

## 2020-11-11 DIAGNOSIS — C7800 Secondary malignant neoplasm of unspecified lung: Secondary | ICD-10-CM | POA: Diagnosis not present

## 2020-11-11 DIAGNOSIS — I700 Atherosclerosis of aorta: Secondary | ICD-10-CM | POA: Diagnosis not present

## 2020-11-11 DIAGNOSIS — J984 Other disorders of lung: Secondary | ICD-10-CM | POA: Insufficient documentation

## 2020-11-11 DIAGNOSIS — I129 Hypertensive chronic kidney disease with stage 1 through stage 4 chronic kidney disease, or unspecified chronic kidney disease: Secondary | ICD-10-CM | POA: Insufficient documentation

## 2020-11-11 DIAGNOSIS — Z800 Family history of malignant neoplasm of digestive organs: Secondary | ICD-10-CM | POA: Diagnosis not present

## 2020-11-11 DIAGNOSIS — D708 Other neutropenia: Secondary | ICD-10-CM

## 2020-11-11 LAB — CBC WITH DIFFERENTIAL/PLATELET
Abs Immature Granulocytes: 0.01 10*3/uL (ref 0.00–0.07)
Basophils Absolute: 0 10*3/uL (ref 0.0–0.1)
Basophils Relative: 1 %
Eosinophils Absolute: 0.1 10*3/uL (ref 0.0–0.5)
Eosinophils Relative: 6 %
HCT: 29.2 % — ABNORMAL LOW (ref 39.0–52.0)
Hemoglobin: 9.8 g/dL — ABNORMAL LOW (ref 13.0–17.0)
Immature Granulocytes: 1 %
Lymphocytes Relative: 19 %
Lymphs Abs: 0.4 10*3/uL — ABNORMAL LOW (ref 0.7–4.0)
MCH: 31.2 pg (ref 26.0–34.0)
MCHC: 33.6 g/dL (ref 30.0–36.0)
MCV: 93 fL (ref 80.0–100.0)
Monocytes Absolute: 0.2 10*3/uL (ref 0.1–1.0)
Monocytes Relative: 11 %
Neutro Abs: 1.4 10*3/uL — ABNORMAL LOW (ref 1.7–7.7)
Neutrophils Relative %: 62 %
Platelets: 63 10*3/uL — ABNORMAL LOW (ref 150–400)
RBC: 3.14 MIL/uL — ABNORMAL LOW (ref 4.22–5.81)
RDW: 13.4 % (ref 11.5–15.5)
WBC: 2 10*3/uL — ABNORMAL LOW (ref 4.0–10.5)
nRBC: 0 % (ref 0.0–0.2)

## 2020-11-11 LAB — COMPREHENSIVE METABOLIC PANEL
ALT: 25 U/L (ref 0–44)
AST: 36 U/L (ref 15–41)
Albumin: 4.2 g/dL (ref 3.5–5.0)
Alkaline Phosphatase: 102 U/L (ref 38–126)
Anion gap: 10 (ref 5–15)
BUN: 25 mg/dL — ABNORMAL HIGH (ref 6–20)
CO2: 22 mmol/L (ref 22–32)
Calcium: 9.9 mg/dL (ref 8.9–10.3)
Chloride: 105 mmol/L (ref 98–111)
Creatinine, Ser: 1.74 mg/dL — ABNORMAL HIGH (ref 0.61–1.24)
GFR, Estimated: 45 mL/min — ABNORMAL LOW (ref >60)
Glucose, Bld: 118 mg/dL — ABNORMAL HIGH (ref 70–99)
Potassium: 4.4 mmol/L (ref 3.5–5.1)
Sodium: 137 mmol/L (ref 135–145)
Total Bilirubin: 0.8 mg/dL (ref 0.3–1.2)
Total Protein: 8 g/dL (ref 6.5–8.1)

## 2020-11-11 LAB — TSH: TSH: 5.476 u[IU]/mL — ABNORMAL HIGH (ref 0.350–4.500)

## 2020-11-11 MED ORDER — HEPARIN SOD (PORK) LOCK FLUSH 100 UNIT/ML IV SOLN
500.0000 [IU] | Freq: Once | INTRAVENOUS | Status: AC | PRN
  Administered 2020-11-11: 500 [IU]
  Filled 2020-11-11: qty 5

## 2020-11-11 MED ORDER — HEPARIN SOD (PORK) LOCK FLUSH 100 UNIT/ML IV SOLN
INTRAVENOUS | Status: AC
  Filled 2020-11-11: qty 5

## 2020-11-11 MED ORDER — SODIUM CHLORIDE 0.9 % IV SOLN
Freq: Once | INTRAVENOUS | Status: AC
Start: 2020-11-11 — End: 2020-11-11
  Filled 2020-11-11: qty 250

## 2020-11-11 MED ORDER — SODIUM CHLORIDE 0.9 % IV SOLN
240.0000 mg | Freq: Once | INTRAVENOUS | Status: AC
  Administered 2020-11-11: 240 mg via INTRAVENOUS
  Filled 2020-11-11: qty 24

## 2020-11-11 NOTE — Progress Notes (Signed)
Hematology/Oncology follow-up [facility] Telephone:([phone] [phone]   Patient Care Team: Albina Billet, MD as PCP - General (Internal Medicine) Earlie Server, MD as Consulting Physician (Hematology and Oncology)  REFERRING PROVIDER: Albina Billet, MD  CHIEF COMPLAINTS/REASON FOR VISIT:  Follow-up for kidney cancer treatments  HISTORY OF PRESENTING ILLNESS:   Henry Moore is a  60 y.o.  male with PMH listed below was seen in consultation at the request of  Albina Billet, MD  for evaluation of kidney cancer Extensive medical records were reviewed Patient had MRI lumbar spine without contrast done on 07/13/2018 which showed mired lumbar degenerative disease. CT thoracic spine was done on 08/05/2018 which showed 5 cm left renal mass. 09/04/2018 CT abdomen and pelvis with and without contrast showed 5 cm round enhancing solid mass in the left kidney.  No involvement of left renal vein.  No lymphadenopathy Morphologic changes consistent with cirrhosis and the portal hypertension.  Small volume ascites and splenomegaly. . 10/16/2018 Left nephrectomy showed renal cell carcinoma, clear-cell type, nuclear grade 4, tumor extends into the renal vein and renal sinus fat.  Urethral, vascular and or resection margins are negative for tumor pT3a Nx Mx  01/28/2019 patient had another CT chest abdomen pelvis done with contrast Showed interval left nephrectomy.  Scattered tiny pulmonary nodules bilaterally.  Nonspecific.  No other evidence of metastatic disease.  Cirrhosis changes extensive coronary and aortic atherosclerosis  07/14/2019 patient underwent surveillance CT chest abdomen pelvis without contrast Interval progression of pulmonary metastasis with new enlarged right paratracheal lymph node 1.7 cm, previously 0.6 cm one-point since worrisome for metastatic adenopathy.  Multiple progressive pulmonary nodules, index nodule within the lingula measures 1.3 cm, previously  3 mm. Index subpleural nodule within the anterior right upper lobe measuring 1.8 cm, previously 3 cm Index nodule within the post lateral right lower lobe measuring 5 mm, new from previous care Aortic sclerosis.  Three-vessel coronary artery calcification noted.  Cirrhosis changes.  Splenomegaly.  Patient was referred to establish care with medical oncology for further discussion and evaluation of metastatic renal cell carcinoma. Patient reports feeling well at baseline today.  Denies any shortness of breath, cough, hemoptysis, back pain, headache. He quitted smoking 2015.  Not currently actively drinking alcohol as well. Patient has chronic back pain unchanged.  #08/14/2019-10/16/2019 nivolumab and ipilimumab. #11/06/2019-nivolumab every 2 weeks maintenance.  # Liver cirrhosis with small amount of ascites/splenomegaly patient sees gastroenterology Dr. [name].  . He will get ultrasound surveillance due in February 2021 for screening of [place].  EGD surveillance for Barrett's plan in April 2021.  # Diabetes, on Humalog sliding scale and metformin.  #Family history of cancer and personal history of RCC.  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  Status post ipilimumab and nivolumab treatment.  # 10/29/2019 CT chest abdomen pelvis without contrast images were independently reviewed by me and discussed with patient.   CT showed marked improvement with resolution of many of the pulmonary nodules, prominent reduced size of other nodules.  Considerably reduced size of the right lower paratracheal lymph node now 0.9 cm in diameter. Hepatic cirrhosis with prominent splenomegaly and trace perisplenic ascites. Chronic CT findings includes aortic atherosclerotic vascular  disease.  Mild sigmoid colon diverticulosis  # CKD, he  establish care with Dr. Singh for chronic kidney disease.  Blood pressure medication has been adjusted.  Hydrochlorothiazide decreased to 12.5 mg daily.  # NGS -foundation one PD-L1 TPS 0% no reportable mutation, MS stable. TMB 8 muts/mb VHL D143fs*16, SETD2 BAP Genetic testing is negative.   # 07/16/2020, CT chest abdomen pelvis images were reviewed and discussed with patient Compared to July 2021 image, he has had a new patchy groundglass opacities throughout both lungs with central part of solids/airspace components.  Findings are nonspecific and could be secondary to an atypical infection, including viral pneumonia or inflammation.  Patient is asymptomatic.  I wonder the changes are secondary to his Covid infection.  Patient has been started on Levaquin to cover atypical infection by on-call physician and I recommend him to continue and complete the course. Proceed with today's immunotherapy nivolumab.  Advised patient to notify me if his symptoms get worse.  # 10/12/2020 CT chest abdomen pelvis with out contrast. New 15 mm lytic lesion in the right T9 vertebral body.  Suspicious for isolated lytic metastasis.  No findings of suspicious soft tissue metastasis in the chest abdomen or pelvis.  Residual postinfectious inflammatory scarring in the lung bilaterally.  Cirrhosis.  Splenomegaly.  Minimal ascites.   INTERVAL HISTORY Henry Moore is a 59 y.o. male who has above history reviewed by me today presents for follow up visit for management of stage IV RCC  Problems and complaints are listed below: Chronic back pain is unchanged.  No new complaints.   Review of Systems  Constitutional: Positive for fatigue. Negative for appetite change, chills, fever and unexpected weight change.  HENT:   Negative for hearing loss and voice change.   Eyes: Negative for eye problems and icterus.  Respiratory: Negative for chest tightness, cough and  shortness of breath.   Cardiovascular: Negative for chest pain and leg swelling.  Gastrointestinal: Negative for abdominal distention and abdominal pain.  Endocrine: Negative for hot flashes.  Genitourinary: Negative for difficulty urinating, dysuria and frequency.   Musculoskeletal: Positive for arthralgias.       Chronic back pain  Skin: Negative for itching and rash.       Hair loss  Neurological: Negative for light-headedness and numbness.  Hematological: Negative for adenopathy. Does not bruise/bleed easily.  Psychiatric/Behavioral: Negative for confusion.    MEDICAL HISTORY:  Past Medical History:  Diagnosis Date  . Cough    SINUS DRAINAGE CAUSING COUGHING , REPORTS CLEAR MUCOUS   . Diabetes mellitus without complication (HCC)   . Family history of breast cancer   . Family history of colon cancer   . Family history of stomach cancer   . HTN (hypertension)   . Hyperlipemia   . Left renal mass   . Platelets decreased (HCC)    DENIES UNSUAL BLEEDING   . PONV (postoperative nausea and vomiting)   . Renal cell carcinoma (HCC) 08/04/2019  . WBC decreased     SURGICAL HISTORY: Past Surgical History:  Procedure Laterality Date  . ANKLE SURGERY Left   . ANTERIOR CERVICAL DECOMP/DISCECTOMY FUSION N/A 04/16/2014   Procedure: ANTERIOR CERVICAL DECOMPRESSION/DISCECTOMY FUSION  (ACDF C5-C7)   (2 LEVELS)     ;  Surgeon: Dahari Brooks, MD;  Location: [facility] OR;  Service: Orthopedics;  Laterality: N/A;  . APPENDECTOMY    . BACK SURGERY    . ESOPHAGOGASTRODUODENOSCOPY (EGD) WITH PROPOFOL N/A 02/06/2019   Procedure: ESOPHAGOGASTRODUODENOSCOPY (EGD) WITH PROPOFOL;  Surgeon: Anna, Kiran, MD;  Location: [facility] END[facility]COPY;  Service:   Gastroenterology;  Laterality: N/A;  . ESOPHAGOGASTRODUODENOSCOPY (EGD) WITH PROPOFOL N/A 03/04/2019   Procedure: ESOPHAGOGASTRODUODENOSCOPY (EGD) WITH PROPOFOL;  Surgeon: Vanga, Rohini Reddy, MD;  Location: [facility] ENDOSCOPY;  Service: Gastroenterology;  Laterality: N/A;  .  ESOPHAGOGASTRODUODENOSCOPY (EGD) WITH PROPOFOL N/A 04/22/2019   Procedure: ESOPHAGOGASTRODUODENOSCOPY (EGD) WITH PROPOFOL;  Surgeon: Anna, Kiran, MD;  Location: [facility] ENDOSCOPY;  Service: Gastroenterology;  Laterality: N/A;  . FRACTURE SURGERY    . HYDROCELE EXCISION    . KNEE ARTHROSCOPY Bilateral   . LAPAROSCOPIC NEPHRECTOMY, HAND ASSISTED Left 10/16/2018   Procedure: LEFT HAND ASSISTED LAPAROSCOPIC NEPHRECTOMY;  Surgeon: Bell, Eugene D III, MD;  Location: [facility];  Service: Urology;  Laterality: Left;  . NASAL SINUS SURGERY     X 2  . 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 PORTA CATH INSERTION N/A 11/11/2019   Procedure: PORTA CATH INSERTION;  Surgeon: Schnier, Gregory G, MD;  Location: [facility] INVASIVE [facility] LAB;  Service: Cardiovascular;  Laterality: N/A;  . SHOULDER SURGERY Left     SOCIAL HISTORY: Social History   Socioeconomic History  . Marital status: Married    Spouse name: Not on file  . Number of children: Not on file  . Years of education: Not on file  . Highest education level: Not on file  Occupational History  . Not on file  Tobacco Use  . Smoking status: Former Smoker    Packs/day: 0.25    Years: 20.00    Pack years: 5.00    Quit date: 2015    Years since quitting: 7.2  . Smokeless tobacco: Never Used  Vaping Use  . Vaping Use: Never used  Substance and Sexual Activity  . Alcohol use: Not Currently    Alcohol/week: 0.0 standard drinks    Comment: no alchol in 1 year  . Drug use: No  . Sexual activity: Not on file  Other Topics Concern  . Not on file  Social History Narrative  . Not on file   Social Determinants of Health   Financial Resource Strain: Not on file  Food Insecurity: Not on file  Transportation Needs: Not on file  Physical Activity: Not on file  Stress: Not on file  Social Connections: Not on file  Intimate Partner Violence: Not on file    FAMILY HISTORY: Family History  Problem Relation Age of Onset  . Diabetes Mother   . Cancer  Mother        neuroendocrine cancer  . Cancer Father        neuroendocrine cancer of meninges  . Cancer Maternal Grandmother        tomach dx 85  . Alzheimer's disease Paternal Grandmother   . Lung cancer Paternal Grandfather   . Lung cancer Maternal Aunt   . Colon cancer Maternal Uncle   . Breast cancer Paternal Aunt        both dx 60s  . Ovarian cancer Other        dx 70  . Breast cancer Cousin     ALLERGIES:  is allergic to codeine and mucinex [guaifenesin er].  MEDICATIONS:  Current Outpatient Medications  Medication Sig Dispense Refill  . ACCU-CHEK AVIVA PLUS test strip CHECK BL00D SUGAR ONCE OR TWICE DAILY. 100 each PRN  . calcium carbonate (OS-CAL - DOSED IN MG OF ELEMENTAL CALCIUM) 1250 (500 Ca) MG tablet Take 1 tablet by mouth.    . carvedilol (COREG) 25 MG tablet Take 1 tablet (25 mg total) by mouth 2 (two) times daily   with a meal. 180 tablet 3  . Cholecalciferol (VITAMIN D3) 125 MCG (5000 UT) CAPS Take 5,000 Units by mouth daily.    . diclofenac Sodium (VOLTAREN) 1 % GEL Apply 2 g topically 4 (four) times daily. 100 g 2  . glipiZIDE (GLUCOTROL) 5 MG tablet Take 1 tablet (5 mg total) by mouth daily before breakfast. 90 tablet 3  . losartan (COZAAR) 100 MG tablet Take 1 tablet by mouth daily. 90 tablet 0  . lovastatin (MEVACOR) 40 MG tablet Take 1 tablet (40 mg total) by mouth at bedtime. 60 tablet 0  . metFORMIN (GLUCOPHAGE-XR) 500 MG 24 hr tablet Take 500 mg by mouth 2 (two) times daily. 2 QAM, 2 QHS    . vitamin B-12 (CYANOCOBALAMIN) 1000 MCG tablet Take 1 tablet (1,000 mcg total) by mouth daily. 90 tablet 0  . amLODipine (NORVASC) 10 MG tablet Take 10 mg by mouth daily. (Patient not taking: Reported on 11/11/2020)    . hydrochlorothiazide (HYDRODIURIL) 25 MG tablet TAKE 1 TABLET DAILY. (Patient not taking: No sig reported) 90 tablet 0  . insulin aspart (NOVOLOG) 100 UNIT/ML injection Inject into the skin. Inject under the skin 3 (three) times a day before meals Sliding  scale, Daughters insulin, using while sugar is very out of wack from cancer treatments, temporary (Patient not taking: No sig reported)    . insulin detemir (LEVEMIR) 100 UNIT/ML injection Inject into the skin. Inject 15 units under the skin every night at night, temporary, daughter's insulin (Patient not taking: No sig reported)    . insulin lispro (INSULIN LISPRO) 100 UNIT/ML KwikPen Junior Inject into the skin 3 (three) times daily as needed (Above 250). Sliding scale (Patient not taking: No sig reported)    . ofloxacin (OCUFLOX) 0.3 % ophthalmic solution Dry eye (Patient not taking: Reported on 11/11/2020)    . prochlorperazine (COMPAZINE) 10 MG tablet Take 1 tablet (10 mg total) by mouth every 6 (six) hours as needed for nausea or vomiting. (Patient not taking: Reported on 11/11/2020) 30 tablet 0   No current facility-administered medications for this visit.   Facility-Administered Medications Ordered in Other Visits  Medication Dose Route Frequency Provider Last Rate Last Admin  . sodium chloride flush (NS) 0.9 % injection 10 mL  10 mL Intravenous PRN Earlie Server, MD   10 mL at 01/22/20 0831     PHYSICAL EXAMINATION: ECOG PERFORMANCE STATUS: 1 - Symptomatic but completely ambulatory Vitals:   11/11/20 0823  BP: 124/78  Pulse: 73  Resp: 18  Temp: 97.7 F (36.5 C)  SpO2: 97%   Filed Weights   11/11/20 0823  Weight: 247 lb (112 kg)    Physical Exam Constitutional:      General: He is not in acute distress.    Appearance: He is obese.  HENT:     Head: Normocephalic and atraumatic.  Eyes:     General: No scleral icterus. Cardiovascular:     Rate and Rhythm: Normal rate and regular rhythm.     Heart sounds: Normal heart sounds.  Pulmonary:     Effort: Pulmonary effort is normal. No respiratory distress.     Breath sounds: No wheezing.  Abdominal:     General: Bowel sounds are normal. There is no distension.     Palpations: Abdomen is soft.  Musculoskeletal:        General:  No deformity. Normal range of motion.     Cervical back: Normal range of motion and neck supple.  Skin:  General: Skin is warm and dry.     Findings: No erythema or rash.  Neurological:     Mental Status: He is alert and oriented to person, place, and time. Mental status is at baseline.     Cranial Nerves: No cranial nerve deficit.     Coordination: Coordination normal.  Psychiatric:        Mood and Affect: Mood normal.     LABORATORY DATA:  I have reviewed the data as listed Lab Results  Component Value Date   WBC 2.0 (L) 11/11/2020   HGB 9.8 (L) 11/11/2020   HCT 29.2 (L) 11/11/2020   MCV 93.0 11/11/2020   PLT 63 (L) 11/11/2020   Recent Labs    04/01/20 0837 04/15/20 0853 04/29/20 0849 05/21/20 1253 10/14/20 0835 10/28/20 0838 11/11/20 0802  NA 139 135 138   < > 137 137 137  K 4.3 4.3 4.2   < > 4.6 4.3 4.4  CL 107 104 108   < > 107 106 105  CO2 21* 21* 23   < > 20* 20* 22  GLUCOSE 123* 162* 109*   < > 145* 122* 118*  BUN 24* 22* 20   < > 30* 26* 25*  CREATININE 1.69* 1.87* 1.58*   < > 1.94* 1.90* 1.74*  CALCIUM 8.5* 8.6* 8.7*   < > 9.1 8.9 9.9  GFRNONAA 44* 39* 48*   < > 40* 41* 45*  GFRAA 51* 45* 55*  --   --   --   --   PROT 7.9 7.8 8.0   < > 8.0 7.5 8.0  ALBUMIN 4.0 4.0 4.3   < > 3.9 3.8 4.2  AST 31 34 29   < > 39 32 36  ALT 24 27 21   < > 37 25 25  ALKPHOS 102 117 108   < > 120 96 102  BILITOT 0.9 0.8 1.5*   < > 1.1 0.9 0.8   < > = values in this interval not displayed.   Iron/TIBC/Ferritin/ %Sat    Component Value Date/Time   IRON 60 09/02/2020 0822   IRON 105 10/15/2018 1325   TIBC 293 09/02/2020 0822   TIBC 244 (L) 10/15/2018 1325   FERRITIN 164 09/02/2020 0822   FERRITIN 588 (H) 10/15/2018 1325   IRONPCTSAT 21 09/02/2020 0822   IRONPCTSAT 43 10/15/2018 1325      RADIOGRAPHIC STUDIES: I have personally reviewed the radiological images as listed and agreed with the findings in the report. NM Bone Scan Whole Body  Result Date:  10/27/2020 CLINICAL DATA:  Thoracic spine lytic lesion. History of left nephrectomy. EXAM: NUCLEAR MEDICINE WHOLE BODY BONE SCAN TECHNIQUE: Whole body anterior and posterior images were obtained approximately 3 hours after intravenous injection of radiopharmaceutical. RADIOPHARMACEUTICALS:  21.7 mCi Technetium-99m MDP IV COMPARISON:  CT 10/12/2020.  Bone scan 08/11/2019. FINDINGS: Prior left nephrectomy. Right renal function and excretion. Minimal increased activity noted at the costovertebral junction on the right at T9 is most likely degenerative. Similar finding noted on prior study of 08/11/2019. No definite T9 vertebral body abnormal activity corresponding to lytic lesion noted on recent CT. Again the recently identified T9 vertebral body lytic lesion is suspicious and further evaluation of the thoracic spine with MRI can be obtained. No other bony abnormalities noted by bone scan. IMPRESSION: 1. Minimal increased activity noted at the right costovertebral junction at the T9 level is most likely degenerative. Similar finding noted on prior bone scan of 08/11/2019. No   definite T9 vertebral body abnormal activity corresponding to lytic lesion noted on recent CT. Again the recently identified T9 vertebral body lytic lesion is suspicious and further evaluation of the thoracic spine with MRI can be obtained. No other bony abnormalities noted by bone scan. 2.  Prior left nephrectomy. Electronically Signed   By: Thomas  Register   On: 10/27/2020 10:27   CT CHEST ABDOMEN PELVIS WO CONTRAST  Result Date: 10/12/2020 CLINICAL DATA:  RCC follow-up, status post left nephrectomy, on immunotherapy EXAM: CT CHEST, ABDOMEN AND PELVIS WITHOUT CONTRAST TECHNIQUE: Multidetector CT imaging of the chest, abdomen and pelvis was performed following the standard protocol without IV contrast. COMPARISON:  07/16/2020 FINDINGS: CT CHEST FINDINGS Cardiovascular: Heart is normal in size.  No pericardial effusion. No evidence of thoracic  aortic aneurysm. Atherosclerotic calcifications of the aortic arch. Three vessel coronary atherosclerosis. Right chest port terminates at the cavoatrial junction. Mediastinum/Nodes: No suspicious mediastinal lymphadenopathy. Visualized thyroid is unremarkable. Lungs/Pleura: Faint subpleural ground-glass opacities/reticulation in the lungs bilaterally (for example, series 4/image 43), improved from the prior, favoring post infectious/inflammatory scarring related to prior atypical infection such as COVID. No suspicious pulmonary nodules. Specifically, the prior right perihilar nodule/lymph node has resolved. No focal consolidation. No pleural effusion or pneumothorax. Musculoskeletal: Cervical spine fixation hardware, incompletely visualized. Degenerative changes of the thoracic spine. 15 mm lytic lesion in the right T9 vertebral body (series 2/image 41), new, suspicious. CT ABDOMEN PELVIS FINDINGS Hepatobiliary: Cirrhosis.  No focal hepatic lesion is seen. Suspected tiny layering gallstone (series 2/image 67), without associated inflammatory changes. No intrahepatic or extrahepatic duct dilatation. Pancreas: Within normal limits. Spleen: Enlarged, measuring 20.3 cm in maximal craniocaudal dimension. Adrenals/Urinary Tract: Adrenal glands are within normal limits. Status post left nephrectomy. No abnormal soft tissue in the surgical bed. Right kidney is within normal limits. No renal calculi or hydronephrosis. Bladder is within normal limits. Stomach/Bowel: Stomach is within normal limits. No evidence of bowel obstruction. Normal appendix (series 2/image 96). No colonic wall thickening or inflammatory changes. Vascular/Lymphatic: Side aneurysm Atherosclerotic calcifications of the abdominal aorta and branch vessels. No suspicious abdominopelvic lymphadenopathy. Reproductive: Prostate is unremarkable. Penile prosthesis. Other: Minimal perihepatic ascites. Mild mesenteric stranding with trace left pelvic ascites  (series 2/image 111). Musculoskeletal: Mild degenerative changes of the lumbar spine. Tiny lytic lesion along the inferior aspect of the L3 vertebral body (series 6/image 149), unchanged. IMPRESSION: Status post left nephrectomy. New 15 mm lytic lesion in the right T9 vertebral body, suspicious for isolated lytic metastasis. No findings suspicious for soft tissue metastases in the chest, abdomen, or pelvis. Residual post infectious inflammatory scarring in the lungs bilaterally. Cirrhosis.  Splenomegaly.  Minimal abdominopelvic ascites. Additional ancillary findings as above. Electronically Signed   By: Sriyesh  Krishnan M.D.   On: 10/12/2020 12:27     ASSESSMENT & PLAN:  1. Renal cell carcinoma of left kidney (HCC)   2. Encounter for antineoplastic immunotherapy   3. Bone lesion   4. Thrombocytopenia (HCC)   5. Other neutropenia (HCC)   6. 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 10/26/20 Bone scan showed Minimal increased activity noted at the right costovertebral junction at the T9 level is most likely degenerative. Similar finding noted on prior bone scan of 08/11/2019. No definite T9 vertebral body abnormal activity corresponding to lytic lesion noted on recent CT. Again the recently identified T9 vertebral body lytic lesion is   suspicious and further evaluation of the thoracic spine with MRI can be obtained. No other bony abnormalities noted by bone scan. I discussed patient's case on tumor board 11/04/2020.  Bone scan probably is not sensitive in detecting lytic lesion from [place].  Consensus reached upon clinically based on the CT scan, new bone lesion is highly likely metastasis from [place].  Options including proceed with bone lesion biopsy versus referral to radiation oncology for empiric palliative radiation.  Patient has other comorbidities including cirrhosis/pancytopenia, patient opts  to proceed with empiric palliative radiation.  I will refer him to establish care with radiation oncology.  #Chronic thrombocytopenia and neutropenia,  due to liver cirrhosis Platelet and ANC counts are slightly decreased.  Stable counts.  Monitor.  #Chronic kidney disease, stable.   Encourage oral hydration and avoid nephrotoxin.  #Anemia, chronic, hemoglobin is 9.8, stable  #Diabetes,  He is off metformin.  Glucose are stable. Follow-up 2 week lab MD for assessment evaluation prior to nivolumab. All questions were answered. The patient knows to call the clinic with any problems questions or concerns.   Earlie Server, MD, PhD Hematology Oncology [facility] Pager- [phone] 11/11/2020

## 2020-11-16 ENCOUNTER — Ambulatory Visit
Admission: RE | Admit: 2020-11-16 | Discharge: 2020-11-16 | Disposition: A | Discharge status: Home / Self Care | Payer: BC Managed Care – PPO | Source: Ambulatory Visit | Attending: Radiation Oncology | Admitting: Radiation Oncology

## 2020-11-16 ENCOUNTER — Encounter: Payer: Self-pay | Admitting: Radiation Oncology

## 2020-11-16 VITALS — BP 171/69 | HR 76 | Temp 95.7°F | Resp 16 | Wt 247.3 lb

## 2020-11-16 DIAGNOSIS — C642 Malignant neoplasm of left kidney, except renal pelvis: Secondary | ICD-10-CM | POA: Diagnosis not present

## 2020-11-16 DIAGNOSIS — C7951 Secondary malignant neoplasm of bone: Secondary | ICD-10-CM

## 2020-11-16 DIAGNOSIS — Z800 Family history of malignant neoplasm of digestive organs: Secondary | ICD-10-CM | POA: Insufficient documentation

## 2020-11-16 DIAGNOSIS — Z87891 Personal history of nicotine dependence: Secondary | ICD-10-CM | POA: Insufficient documentation

## 2020-11-16 DIAGNOSIS — Z79899 Other long term (current) drug therapy: Secondary | ICD-10-CM | POA: Diagnosis not present

## 2020-11-16 DIAGNOSIS — C7800 Secondary malignant neoplasm of unspecified lung: Secondary | ICD-10-CM | POA: Insufficient documentation

## 2020-11-16 DIAGNOSIS — M545 Low back pain, unspecified: Secondary | ICD-10-CM | POA: Diagnosis not present

## 2020-11-16 DIAGNOSIS — E785 Hyperlipidemia, unspecified: Secondary | ICD-10-CM | POA: Insufficient documentation

## 2020-11-16 DIAGNOSIS — Z794 Long term (current) use of insulin: Secondary | ICD-10-CM | POA: Insufficient documentation

## 2020-11-16 DIAGNOSIS — I10 Essential (primary) hypertension: Secondary | ICD-10-CM | POA: Insufficient documentation

## 2020-11-16 DIAGNOSIS — E119 Type 2 diabetes mellitus without complications: Secondary | ICD-10-CM | POA: Insufficient documentation

## 2020-11-16 NOTE — Consult Note (Signed)
NEW PATIENT EVALUATION  Name: [facility]  MRN: [id number]  Date:   11/16/2020     DOB: [birth year]   This 60 y.o. 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  REFERRING PHYSICIAN: Albina Billet, MD  CHIEF COMPLAINT:  Chief Complaint  Patient presents with  . Cancer    Initial consultation    DIAGNOSIS: The encounter diagnosis was Bone metastasis ([place]).   PREVIOUS INVESTIGATIONS:  CT scans bone scan reviewed Clinical notes reviewed Pathology report reviewed Case presented at weekly tumor conference  HPI: Patient is a 60 year old male with known stage IV renal cell carcinoma.  He is always had back issues with osteophytes of his lumbar and thoracic spine.  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  Tumor extended into the renal vein and renal sinus fat.  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  He has been having some back pain and in March 2022 he was noted to have a 1.5 cm lytic lesion in the right T9 vertebral body suspicious for isolated lytic metastatic disease.  Bone scan showed minimal increased activity at the right costovertebral junction at the T9 level although this is not unusual in renal cell carcinoma and bone scan findings.  Case was presented at weekly tumor conference with recommendation for palliative radiation therapy to the T9 vertebral body.  Lytic lesion is close in approximation to the spinal canal.  Patient is having no problems ambulating.  He continues to have low back pain although he states this has been present most of his adult life.  He is now referred to radiation collagen for opinion. PLANNED TREATMENT REGIMEN: SBRT  PAST MEDICAL HISTORY:  has a past medical history of Cough, Diabetes mellitus without  complication ([place]), Family history of breast cancer, Family history of colon cancer, Family history of stomach cancer, HTN (hypertension), Hyperlipemia, Left renal mass, Platelets decreased ([place]), PONV (postoperative nausea and vomiting), Renal cell carcinoma ([place]) (08/04/2019), and WBC decreased.    PAST SURGICAL HISTORY:  Past Surgical History:  Procedure Laterality Date  . ANKLE SURGERY Left   . ANTERIOR CERVICAL DECOMP/DISCECTOMY FUSION N/A 04/16/2014   Procedure: ANTERIOR CERVICAL DECOMPRESSION/DISCECTOMY FUSION  (ACDF C5-C7)   (2 LEVELS)     ;  Surgeon: Melina Schools, MD;  Location: [facility];  Service: Orthopedics;  Laterality: N/A;  . APPENDECTOMY    . BACK SURGERY    . ESOPHAGOGASTRODUODENOSCOPY (EGD) WITH PROPOFOL N/A 02/06/2019   Procedure: ESOPHAGOGASTRODUODENOSCOPY (EGD) WITH PROPOFOL;  Surgeon: [name], MD;  Location[facility] ENDOSCOPY;  Service: Gastroenterology;  Laterality: N/A;  . ESOPHAGOGASTRODUODENOSCOPY (EGD) WITH PROPOFOL N/A 03/04/2019   Procedure: ESOPHAGOGASTRODUODENOSCOPY (EGD) WITH PROPOFOL;  Surgeon: Lin Landsman, MD;  Location: [facility] ENDOSCOPY;  Service: Gastroenterology;  Laterality: N/A;  . ESOPHAGOGASTRODUODENOSCOPY (EGD) WITH PROPOFOL N/A 04/22/2019   Procedure: ESOPHAGOGASTRODUODENOSCOPY (EGD) WITH PROPOFOL;  Surgeon: [name], MD;  Location: [facility] ENDOSCOPY;  Service: Gastroenterology;  Laterality: N/A;  . FRACTURE SURGERY    . HYDROCELE EXCISION    . KNEE ARTHROSCOPY Bilateral   . LAPAROSCOPIC NEPHRECTOMY, HAND ASSISTED Left 10/16/2018   Procedure: LEFT HAND ASSISTED LAPAROSCOPIC NEPHRECTOMY;  Surgeon: Lucas Mallow, MD;  Location: [facility] ORS;  Service: Urology;  Laterality: Left;  . NASAL SINUS SURGERY  X 2  . 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 PORTA CATH INSERTION N/A 11/11/2019   Procedure: PORTA CATH INSERTION;  Surgeon: Katha Cabal, MD;  Location: [place] CV LAB;  Service: Cardiovascular;  Laterality: N/A;  . SHOULDER SURGERY  Left     FAMILY HISTORY: family history includes Alzheimer's disease in his paternal grandmother; Breast cancer in his cousin and paternal aunt; Cancer in his father, maternal grandmother, and mother; Colon cancer in his maternal uncle; Diabetes in his mother; Lung cancer in his maternal aunt and paternal grandfather; Ovarian cancer in an other family member.  SOCIAL HISTORY:  reports that he quit smoking about 7 years ago. He has a 5.00 pack-year smoking history. He has never used smokeless tobacco. He reports previous alcohol use. He reports that he does not use drugs.  ALLERGIES: Codeine and Mucinex [guaifenesin er]  MEDICATIONS:  Current Outpatient Medications  Medication Sig Dispense Refill  . ACCU-CHEK AVIVA PLUS test strip CHECK BL00D SUGAR ONCE OR TWICE DAILY. 100 each PRN  . calcium carbonate (OS-CAL - DOSED IN MG OF ELEMENTAL CALCIUM) 1250 (500 Ca) MG tablet Take 1 tablet by mouth.    . carvedilol (COREG) 25 MG tablet Take 1 tablet (25 mg total) by mouth 2 (two) times daily with a meal. 180 tablet 3  . Cholecalciferol (VITAMIN D3) 125 MCG (5000 UT) CAPS Take 5,000 Units by mouth daily.    . diclofenac Sodium (VOLTAREN) 1 % GEL Apply 2 g topically 4 (four) times daily. 100 g 2  . glipiZIDE (GLUCOTROL) 5 MG tablet Take 1 tablet (5 mg total) by mouth daily before breakfast. 90 tablet 3  . losartan (COZAAR) 100 MG tablet Take 1 tablet by mouth daily. 90 tablet 0  . lovastatin (MEVACOR) 40 MG tablet Take 1 tablet (40 mg total) by mouth at bedtime. 60 tablet 0  . metFORMIN (GLUCOPHAGE-XR) 500 MG 24 hr tablet Take 500 mg by mouth 2 (two) times daily. 2 QAM, 2 QHS    . vitamin B-12 (CYANOCOBALAMIN) 1000 MCG tablet Take 1 tablet (1,000 mcg total) by mouth daily. 90 tablet 0  . amLODipine (NORVASC) 10 MG tablet Take 10 mg by mouth daily. (Patient not taking: No sig reported)    . hydrochlorothiazide (HYDRODIURIL) 25 MG tablet TAKE 1 TABLET DAILY. (Patient not taking: No sig reported) 90 tablet  0  . insulin aspart (NOVOLOG) 100 UNIT/ML injection Inject into the skin. Inject under the skin 3 (three) times a day before meals Sliding scale, Daughters insulin, using while sugar is very out of wack from cancer treatments, temporary (Patient not taking: No sig reported)    . insulin detemir (LEVEMIR) 100 UNIT/ML injection Inject into the skin. Inject 15 units under the skin every night at night, temporary, daughter's insulin (Patient not taking: No sig reported)    . insulin lispro (INSULIN LISPRO) 100 UNIT/ML KwikPen Junior Inject into the skin 3 (three) times daily as needed (Above 250). Sliding scale (Patient not taking: No sig reported)    . ofloxacin (OCUFLOX) 0.3 % ophthalmic solution Dry eye (Patient not taking: No sig reported)    . prochlorperazine (COMPAZINE) 10 MG tablet Take 1 tablet (10 mg total) by mouth every 6 (six) hours as needed for nausea or vomiting. (Patient not taking: No sig reported) 30 tablet 0   No current facility-administered medications for this encounter.   Facility-Administered Medications Ordered in Other Encounters  Medication Dose Route Frequency Provider Last Rate Last Admin  .  sodium chloride flush (NS) 0.9 % injection 10 mL  10 mL Intravenous PRN Earlie Server, MD   10 mL at 01/22/20 0831    ECOG PERFORMANCE STATUS:  1 - Symptomatic but completely ambulatory  REVIEW OF SYSTEMS: Patient denies any weight loss, fatigue, weakness, fever, chills or night sweats. Patient denies any loss of vision, blurred vision. Patient denies any ringing  of the ears or hearing loss. No irregular heartbeat. Patient denies heart murmur or history of fainting. Patient denies any chest pain or pain radiating to her upper extremities. Patient denies any shortness of breath, difficulty breathing at night, cough or hemoptysis. Patient denies any swelling in the lower legs. Patient denies any nausea vomiting, vomiting of blood, or coffee ground material in the vomitus. Patient denies any  stomach pain. Patient states has had normal bowel movements no significant constipation or diarrhea. Patient denies any dysuria, hematuria or significant nocturia. Patient denies any problems walking, swelling in the joints or loss of balance. Patient denies any skin changes, loss of hair or loss of weight. Patient denies any excessive worrying or anxiety or significant depression. Patient denies any problems with insomnia. Patient denies excessive thirst, polyuria, polydipsia. Patient denies any swollen glands, patient denies easy bruising or easy bleeding. Patient denies any recent infections, allergies or URI. Patient "s visual fields have not changed significantly in recent time.   PHYSICAL EXAM: BP (!) 171/69 (BP Location: Left Arm, Patient Position: Sitting)   Pulse 76   Temp (!) 95.7 F (35.4 C) (Tympanic)   Resp 16   Wt 247 lb 4.8 oz (112.2 kg)   BMI 32.63 kg/m  Motor or sensory and DTR levels are equal and symmetric in the lower extremities.  Well-developed well-nourished patient in NAD. HEENT reveals PERLA, EOMI, discs not visualized.  Oral cavity is clear. No oral mucosal lesions are identified. Neck is clear without evidence of cervical or supraclavicular adenopathy. Lungs are clear to A&P. Cardiac examination is essentially unremarkable with regular rate and rhythm without murmur rub or thrill. Abdomen is benign with no organomegaly or masses noted. Motor sensory and DTR levels are equal and symmetric in the upper and lower extremities. Cranial nerves II through XII are grossly intact. Proprioception is intact. No peripheral adenopathy or edema is identified. No motor or sensory levels are noted. Crude visual fields are within normal range.  LABORATORY DATA: Pathology report reviewed    RADIOLOGY RESULTS: Bone scan and CT scans reviewed compatible with above-stated findings   IMPRESSION: High probability of metastatic involvement of renal cell carcinoma at the T9 vertebral body in  60 year old male  PLAN: At this time I have recommended SBRT to his T9 vertebral body.  Studies have shown metastatic renal cell carcinomas radioresistant lesion.  Recommendations is for high fractionation and I have proposed SBRT to his T9 vertebral body.  We will try to encompass the lytic lesion and 30 Gray in 5 fractions to the G TV and 2400 centigrade to the remainder of the vertebral body as the PTV.  Risks and benefits of treatment including possible fatigue possible skin reaction mild chance of abdominal complaints all were discussed in detail with the patient.  He seems to comprehend our treatment plan well.  I have personally set up and ordered CT simulation for later this week.  I would like to take this opportunity to thank you for allowing me to participate in the care of your patient.Noreene Filbert, MD

## 2020-11-18 ENCOUNTER — Ambulatory Visit
Admission: RE | Admit: 2020-11-18 | Discharge: 2020-11-18 | Disposition: A | Discharge status: Home / Self Care | Payer: Medicare Other | Source: Ambulatory Visit | Attending: Radiation Oncology | Admitting: Radiation Oncology

## 2020-11-18 DIAGNOSIS — C7800 Secondary malignant neoplasm of unspecified lung: Secondary | ICD-10-CM | POA: Insufficient documentation

## 2020-11-18 DIAGNOSIS — C7951 Secondary malignant neoplasm of bone: Secondary | ICD-10-CM | POA: Insufficient documentation

## 2020-11-18 DIAGNOSIS — C642 Malignant neoplasm of left kidney, except renal pelvis: Secondary | ICD-10-CM | POA: Insufficient documentation

## 2020-11-18 DIAGNOSIS — Z510 Encounter for antineoplastic radiation therapy: Secondary | ICD-10-CM | POA: Diagnosis not present

## 2020-11-24 DIAGNOSIS — Z510 Encounter for antineoplastic radiation therapy: Secondary | ICD-10-CM | POA: Diagnosis not present

## 2020-11-24 IMAGING — CT CT ABD-PELV W/O CM
2 of 4 series · 14 of 36 positions shown, 17 images · non-contrast
Comparison: Multiple exams, including 07/14/2019

CLINICAL DATA: Metastatic left renal cell carcinoma, prior
nephrectomy.

EXAM:
CT CHEST, ABDOMEN AND PELVIS WITHOUT CONTRAST
TECHNIQUE: Multidetector CT imaging of the chest, abdomen and pelvis was
performed following the standard protocol without IV contrast.

[Series 2: cap wo st · axial · 0.88mm/px · z∈[-773,-173]mm · 11 of 146 slices shown, 14 images]
[im 13/146  mediastinal]
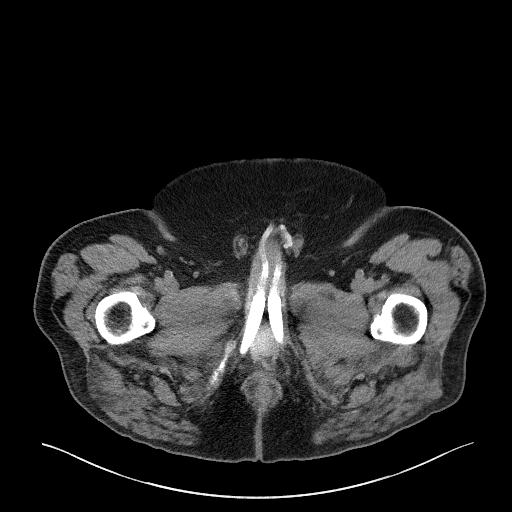
[im 13/146  lung]
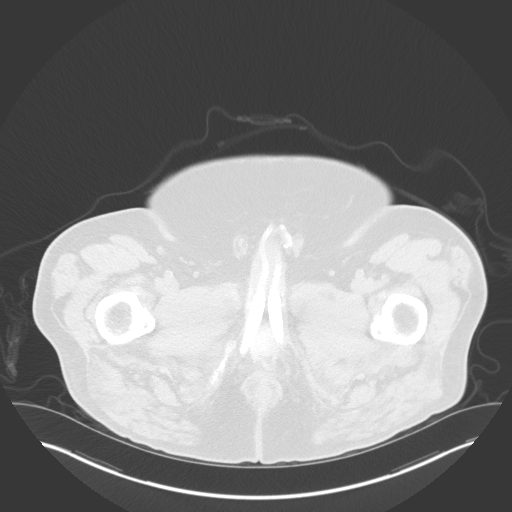
[im 25/146  lung]
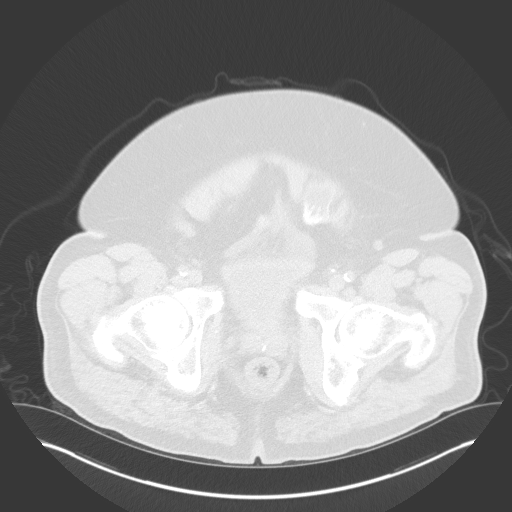
[im 37/146  lung]
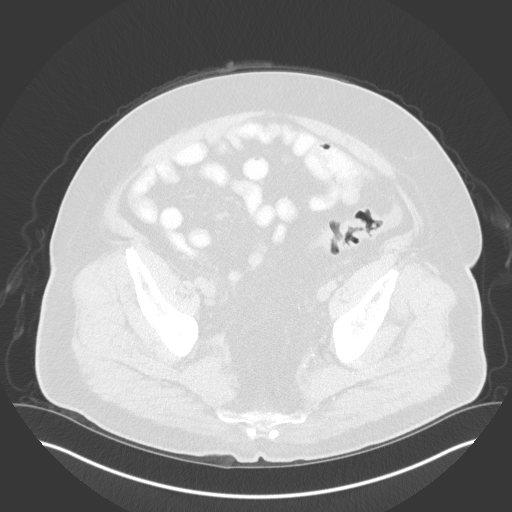
[im 49/146  lung]
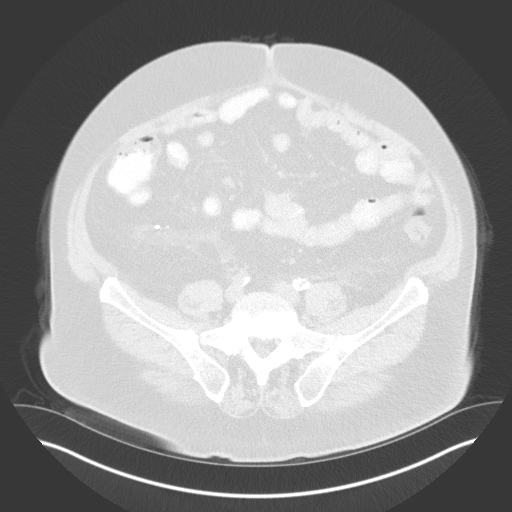
[im 61/146  mediastinal]
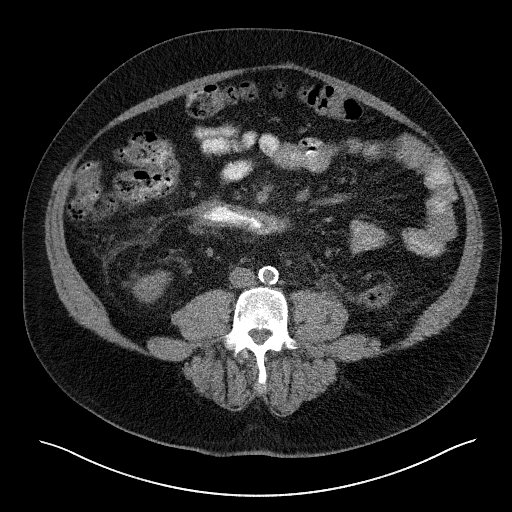
[im 61/146  lung]
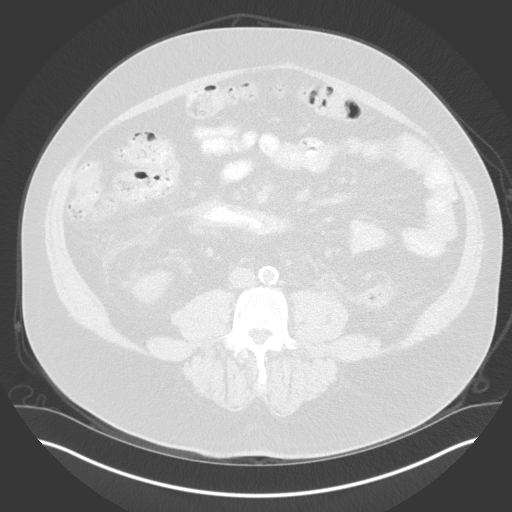
[im 73/146  lung]
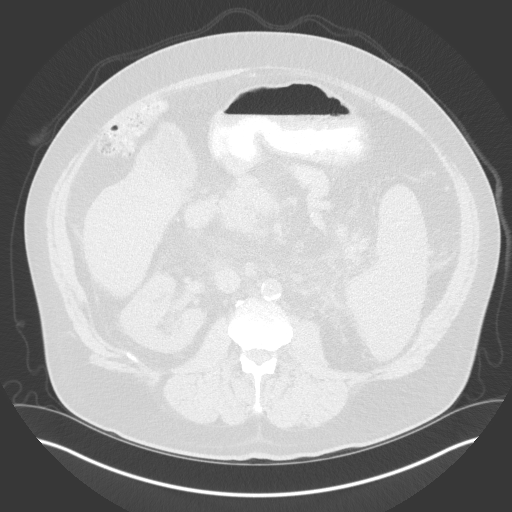
[im 85/146  lung]
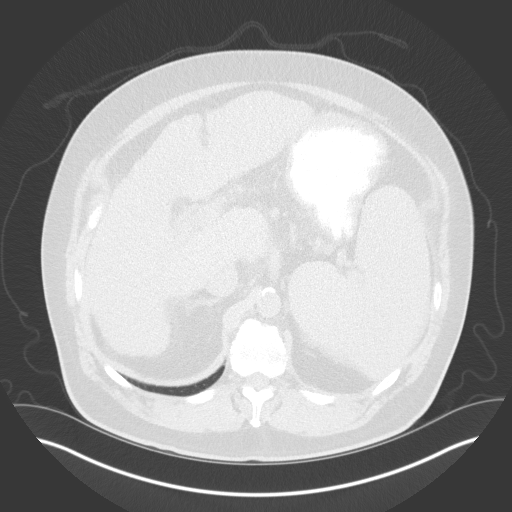
[im 97/146  lung]
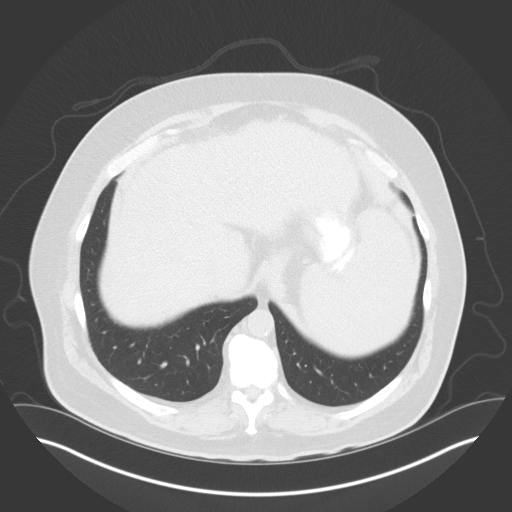
[im 109/146  mediastinal]
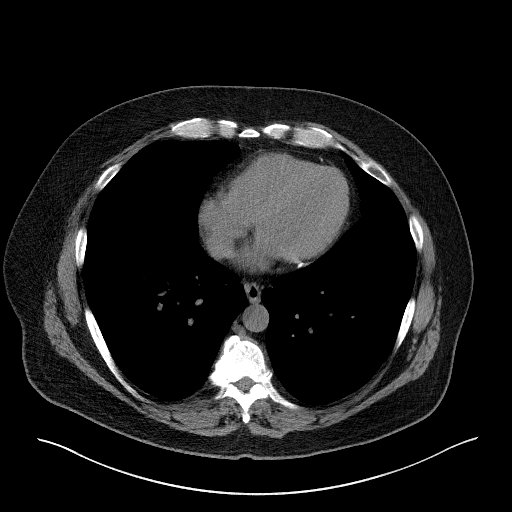
[im 109/146  lung]
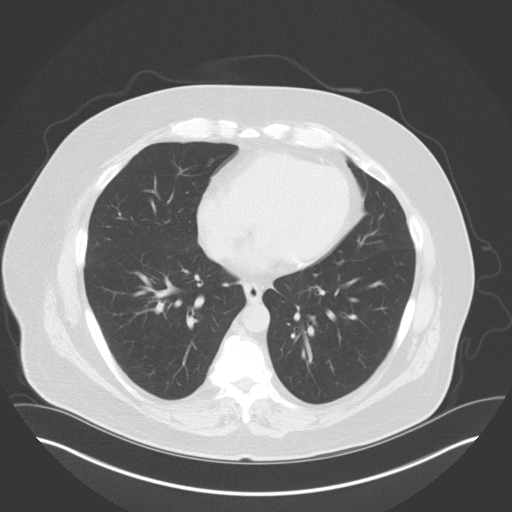
[im 121/146  lung]
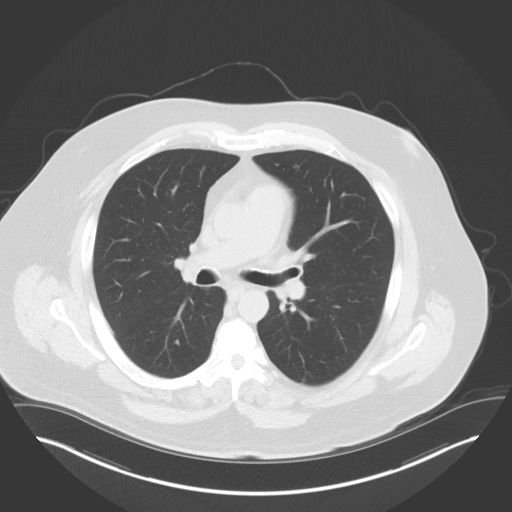
[im 133/146  lung]
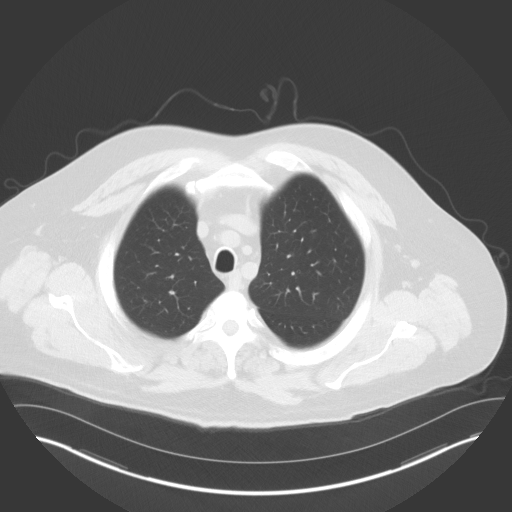

[Series 5: coronal · coronal · 0.97mm/px · 3 of 193 slices shown]
[im 39/193  lung]
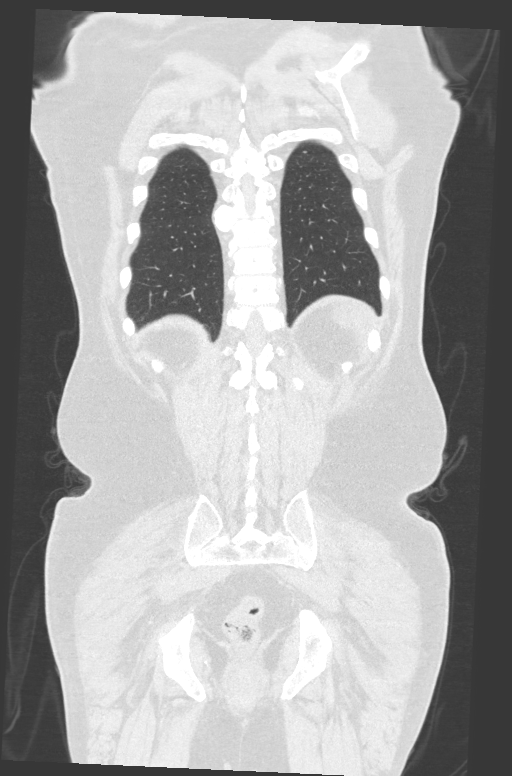
[im 77/193  lung]
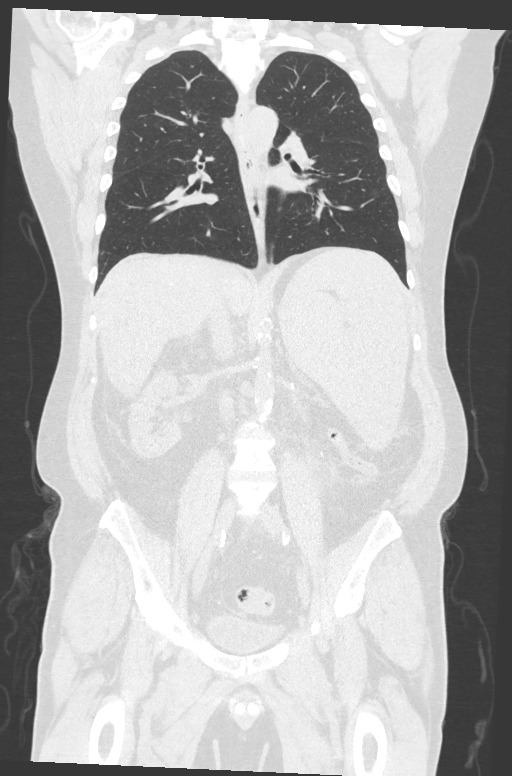
[im 116/193  lung]
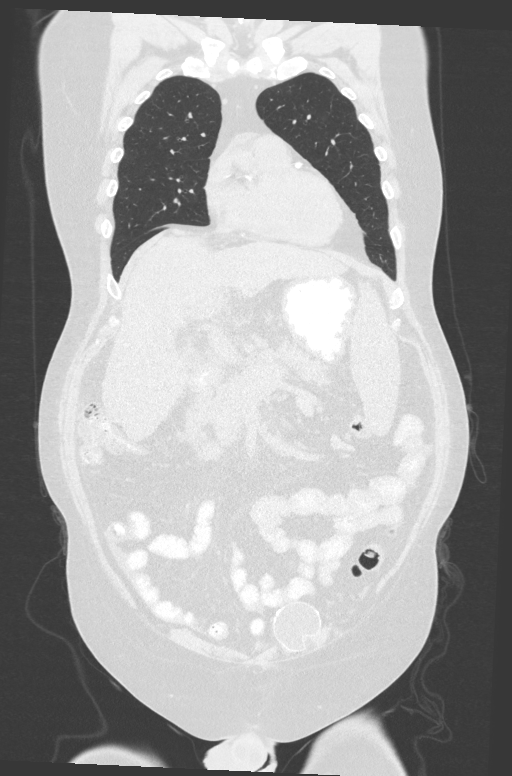

[14 of 36 positions shown; findings below may reference images not displayed]

FINDINGS: CT CHEST FINDINGS

Cardiovascular: Coronary, aortic arch, and branch vessel
atherosclerotic vascular disease.

Mediastinum/Nodes: Right lower paratracheal node 0.9 cm in short
axis on image 21/504, previously 1.8 cm by my measurements.

Small stable left internal mammary lymph nodes including a 0.6 cm
lymph node on image 21/504.

Lungs/Pleura: Marked improvement, with resolution of many of the
pulmonary nodules, and prominently reduced size of other nodules.
For example, a previously dominant 1.4 by 1.2 cm lingular nodule has
completely resolved. A subpleural nodule in the right upper lobe on
image 38/505 currently measures 0.4 cm in thickness on image 38/505,
previously 0.8 cm in thickness. No new nodules identified.

Musculoskeletal: Lower cervical plate and screw fixator. Lower
thoracic spondylosis.

CT ABDOMEN PELVIS FINDINGS

Hepatobiliary: Nodular contour and morphology compatible with
cirrhosis common no contour change from prior. Suspected gallbladder
wall thickening.

Pancreas: Unremarkable

Spleen: Splenomegaly. The spleen measures 15.4 by 9.8 by 20.0 cm
(volume = 3544 cm^3).

Adrenals/Urinary Tract: Both adrenal glands appear normal.
Unremarkable contour of the right kidney without findings of urinary
tract calculi. Left nephrectomy.

Stomach/Bowel: Mild sigmoid colon diverticulosis.

Vascular/Lymphatic: Aortoiliac atherosclerotic vascular disease.
Scattered small right gastric and peripancreatic lymph nodes not
appreciably changed from prior. No overt pathologic adenopathy
observed.

Reproductive: Penile implant. Dystrophic calcifications along the
prostate gland.

Other: Stable mild mesenteric edema. Trace perisplenic ascites.

Musculoskeletal: Unremarkable
IMPRESSION: 1. Marked improvement, with resolution of many of the pulmonary
nodules, and prominently reduced size of other nodules. Considerably
reduced size of the right lower paratracheal lymph node, now 0.9 cm
in diameter.
2. Hepatic cirrhosis with prominent splenomegaly and trace
perisplenic ascites.
3. Other imaging findings of potential clinical significance:
Coronary, aortic arch, and branch vessel atherosclerotic vascular
disease. Mild sigmoid colon diverticulosis.

Aortic Atherosclerosis (NYUT1-D0D.D).

## 2020-11-25 ENCOUNTER — Other Ambulatory Visit: Payer: Self-pay

## 2020-11-25 ENCOUNTER — Inpatient Hospital Stay: Payer: Medicare Other

## 2020-11-25 ENCOUNTER — Inpatient Hospital Stay (HOSPITAL_BASED_OUTPATIENT_CLINIC_OR_DEPARTMENT_OTHER): Payer: Medicare Other | Admitting: Oncology

## 2020-11-25 ENCOUNTER — Encounter: Payer: Self-pay | Admitting: Oncology

## 2020-11-25 VITALS — BP 128/64 | HR 70 | Temp 96.9°F | Resp 18 | Wt 246.7 lb

## 2020-11-25 DIAGNOSIS — D708 Other neutropenia: Secondary | ICD-10-CM

## 2020-11-25 DIAGNOSIS — Z5112 Encounter for antineoplastic immunotherapy: Secondary | ICD-10-CM

## 2020-11-25 DIAGNOSIS — C642 Malignant neoplasm of left kidney, except renal pelvis: Secondary | ICD-10-CM

## 2020-11-25 DIAGNOSIS — D696 Thrombocytopenia, unspecified: Secondary | ICD-10-CM

## 2020-11-25 DIAGNOSIS — N1832 Chronic kidney disease, stage 3b: Secondary | ICD-10-CM

## 2020-11-25 DIAGNOSIS — C649 Malignant neoplasm of unspecified kidney, except renal pelvis: Secondary | ICD-10-CM

## 2020-11-25 DIAGNOSIS — M899 Disorder of bone, unspecified: Secondary | ICD-10-CM | POA: Diagnosis not present

## 2020-11-25 DIAGNOSIS — D631 Anemia in chronic kidney disease: Secondary | ICD-10-CM

## 2020-11-25 LAB — COMPREHENSIVE METABOLIC PANEL
ALT: 27 U/L (ref 0–44)
AST: 35 U/L (ref 15–41)
Albumin: 3.9 g/dL (ref 3.5–5.0)
Alkaline Phosphatase: 117 U/L (ref 38–126)
Anion gap: 8 (ref 5–15)
BUN: 23 mg/dL — ABNORMAL HIGH (ref 6–20)
CO2: 22 mmol/L (ref 22–32)
Calcium: 9.7 mg/dL (ref 8.9–10.3)
Chloride: 106 mmol/L (ref 98–111)
Creatinine, Ser: 1.76 mg/dL — ABNORMAL HIGH (ref 0.61–1.24)
GFR, Estimated: 45 mL/min — ABNORMAL LOW (ref >60)
Glucose, Bld: 125 mg/dL — ABNORMAL HIGH (ref 70–99)
Potassium: 4.9 mmol/L (ref 3.5–5.1)
Sodium: 136 mmol/L (ref 135–145)
Total Bilirubin: 0.7 mg/dL (ref 0.3–1.2)
Total Protein: 7.8 g/dL (ref 6.5–8.1)

## 2020-11-25 LAB — CBC WITH DIFFERENTIAL/PLATELET
Abs Immature Granulocytes: 0.01 10*3/uL (ref 0.00–0.07)
Basophils Absolute: 0 10*3/uL (ref 0.0–0.1)
Basophils Relative: 1 %
Eosinophils Absolute: 0.1 10*3/uL (ref 0.0–0.5)
Eosinophils Relative: 6 %
HCT: 29.2 % — ABNORMAL LOW (ref 39.0–52.0)
Hemoglobin: 9.8 g/dL — ABNORMAL LOW (ref 13.0–17.0)
Immature Granulocytes: 1 %
Lymphocytes Relative: 21 %
Lymphs Abs: 0.4 10*3/uL — ABNORMAL LOW (ref 0.7–4.0)
MCH: 31.5 pg (ref 26.0–34.0)
MCHC: 33.6 g/dL (ref 30.0–36.0)
MCV: 93.9 fL (ref 80.0–100.0)
Monocytes Absolute: 0.3 10*3/uL (ref 0.1–1.0)
Monocytes Relative: 15 %
Neutro Abs: 1 10*3/uL — ABNORMAL LOW (ref 1.7–7.7)
Neutrophils Relative %: 56 %
Platelets: 56 10*3/uL — ABNORMAL LOW (ref 150–400)
RBC: 3.11 MIL/uL — ABNORMAL LOW (ref 4.22–5.81)
RDW: 13.3 % (ref 11.5–15.5)
WBC: 1.8 10*3/uL — ABNORMAL LOW (ref 4.0–10.5)
nRBC: 0 % (ref 0.0–0.2)

## 2020-11-25 MED ORDER — HEPARIN SOD (PORK) LOCK FLUSH 100 UNIT/ML IV SOLN
INTRAVENOUS | Status: AC
  Filled 2020-11-25: qty 5

## 2020-11-25 MED ORDER — SODIUM CHLORIDE 0.9 % IV SOLN
Freq: Once | INTRAVENOUS | Status: AC
Start: 2020-11-25 — End: 2020-11-25
  Filled 2020-11-25: qty 250

## 2020-11-25 MED ORDER — SODIUM CHLORIDE 0.9% FLUSH
10.0000 mL | Freq: Once | INTRAVENOUS | Status: AC
  Administered 2020-11-25: 10 mL via INTRAVENOUS
  Filled 2020-11-25: qty 10

## 2020-11-25 MED ORDER — HEPARIN SOD (PORK) LOCK FLUSH 100 UNIT/ML IV SOLN
500.0000 [IU] | Freq: Once | INTRAVENOUS | Status: DC
  Filled 2020-11-25: qty 5

## 2020-11-25 MED ORDER — SODIUM CHLORIDE 0.9 % IV SOLN
240.0000 mg | Freq: Once | INTRAVENOUS | Status: AC
  Administered 2020-11-25: 240 mg via INTRAVENOUS
  Filled 2020-11-25: qty 24

## 2020-11-25 MED ORDER — HEPARIN SOD (PORK) LOCK FLUSH 100 UNIT/ML IV SOLN
500.0000 [IU] | Freq: Once | INTRAVENOUS | Status: AC | PRN
  Administered 2020-11-25: 500 [IU]
  Filled 2020-11-25: qty 5

## 2020-11-25 NOTE — Patient Instructions (Signed)
[facility]  Discharge Instructions: Thank you for choosing [facility] to provide your oncology and hematology care.  If you have a lab appointment with the [facility], please go directly to the [facility] and check in at the registration area.  Wear comfortable clothing and clothing appropriate for easy access to any Portacath or PICC line.   We strive to give you quality time with your provider. You may need to reschedule your appointment if you arrive late (15 or more minutes).  Arriving late affects you and other patients whose appointments are after yours.  Also, if you miss three or more appointments without notifying the office, you may be dismissed from the clinic at the provider's discretion.      For prescription refill requests, have your pharmacy contact our office and allow 72 hours for refills to be completed.    Today you received the following chemotherapy and/or immunotherapy agents: Opdivo      To help prevent nausea and vomiting after your treatment, we encourage you to take your nausea medication as directed.  BELOW ARE SYMPTOMS THAT SHOULD BE REPORTED IMMEDIATELY: . *FEVER GREATER THAN 100.4 F (38 C) OR HIGHER . *CHILLS OR SWEATING . *NAUSEA AND VOMITING THAT IS NOT CONTROLLED WITH YOUR NAUSEA MEDICATION . *UNUSUAL SHORTNESS OF BREATH . *UNUSUAL BRUISING OR BLEEDING . *URINARY PROBLEMS (pain or burning when urinating, or frequent urination) . *BOWEL PROBLEMS (unusual diarrhea, constipation, pain near the anus) . TENDERNESS IN MOUTH AND THROAT WITH OR WITHOUT PRESENCE OF ULCERS (sore throat, sores in mouth, or a toothache) . UNUSUAL RASH, SWELLING OR PAIN  . UNUSUAL VAGINAL DISCHARGE OR ITCHING   Items with * indicate a potential emergency and should be followed up as soon as possible or go to the Emergency Department if any problems should occur.  Please show the CHEMOTHERAPY ALERT CARD or IMMUNOTHERAPY ALERT  CARD at check-in to the Emergency Department and triage nurse.  Should you have questions after your visit or need to cancel or reschedule your appointment, please contact [facility]  [phone] and follow the prompts.  Office hours are 8:00 a.m. to 4:30 p.m. Monday - Friday. Please note that voicemails left after 4:00 p.m. may not be returned until the following business day.  We are closed weekends and major holidays. You have access to a nurse at all times for urgent questions. Please call the main number to the clinic [phone] and follow the prompts.  For any non-urgent questions, you may also contact your provider using MyChart. We now offer e-Visits for anyone 58 and older to request care online for non-urgent symptoms. For details visit [email or web address].   Also download the MyChart app! Go to the app store, search "MyChart", open the app, select [facility], and log in with your MyChart username and password.  Due to Covid, a mask is required upon entering the hospital/clinic. If you do not have a mask, one will be given to you upon arrival. For doctor visits, patients may have 1 support person aged 58 or older with them. For treatment visits, patients cannot have anyone with them due to current Covid guidelines and our immunocompromised population.   Nivolumab injection What is this medicine? NIVOLUMAB (nye VOL ue mab) is a monoclonal antibody. It treats certain types of cancer. Some of the cancers treated are colon cancer, head and neck cancer, Hodgkin lymphoma, lung cancer, and melanoma. This medicine may be used for  other purposes; ask your health care provider or pharmacist if you have questions. COMMON BRAND NAME(S): Opdivo What should I tell my health care provider before I take this medicine? They need to know if you have any of these conditions:  autoimmune diseases like Crohn's disease, ulcerative colitis, or lupus  have had or  planning to have an allogeneic stem cell transplant (uses someone else's stem cells)  history of chest radiation  history of organ transplant  nervous system problems like myasthenia gravis or Guillain-Barre syndrome  an unusual or allergic reaction to nivolumab, other medicines, foods, dyes, or preservatives  pregnant or trying to get pregnant  breast-feeding How should I use this medicine? This medicine is for infusion into a vein. It is given by a health care professional in a hospital or clinic setting. A special MedGuide will be given to you before each treatment. Be sure to read this information carefully each time. Talk to your pediatrician regarding the use of this medicine in children. While this drug may be prescribed for children as young as 12 years for selected conditions, precautions do apply. Overdosage: If you think you have taken too much of this medicine contact a poison control center or emergency room at once. NOTE: This medicine is only for you. Do not share this medicine with others. What if I miss a dose? It is important not to miss your dose. Call your doctor or health care professional if you are unable to keep an appointment. What may interact with this medicine? Interactions have not been studied. This list may not describe all possible interactions. Give your health care provider a list of all the medicines, herbs, non-prescription drugs, or dietary supplements you use. Also tell them if you smoke, drink alcohol, or use illegal drugs. Some items may interact with your medicine. What should I watch for while using this medicine? This drug may make you feel generally unwell. Continue your course of treatment even though you feel ill unless your doctor tells you to stop. You may need blood work done while you are taking this medicine. Do not become pregnant while taking this medicine or for 5 months after stopping it. Women should inform their doctor if they wish  to become pregnant or think they might be pregnant. There is a potential for serious side effects to an unborn child. Talk to your health care professional or pharmacist for more information. Do not breast-feed an infant while taking this medicine or for 5 months after stopping it. What side effects may I notice from receiving this medicine? Side effects that you should report to your doctor or health care professional as soon as possible:  allergic reactions like skin rash, itching or hives, swelling of the face, lips, or tongue  breathing problems  blood in the urine  bloody or watery diarrhea or black, tarry stools  changes in emotions or moods  changes in vision  chest pain  cough  dizziness  feeling faint or lightheaded, falls  fever, chills  headache with fever, neck stiffness, confusion, loss of memory, sensitivity to light, hallucination, loss of contact with reality, or seizures  joint pain  mouth sores  redness, blistering, peeling or loosening of the skin, including inside the mouth  severe muscle pain or weakness  signs and symptoms of high blood sugar such as dizziness; dry mouth; dry skin; fruity breath; nausea; stomach pain; increased hunger or thirst; increased urination  signs and symptoms of kidney injury like trouble passing urine  or change in the amount of urine  signs and symptoms of liver injury like dark yellow or brown urine; general ill feeling or flu-like symptoms; light-colored stools; loss of appetite; nausea; right upper belly pain; unusually weak or tired; yellowing of the eyes or skin  swelling of the ankles, feet, hands  trouble passing urine or change in the amount of urine  unusually weak or tired  weight gain or loss Side effects that usually do not require medical attention (report to your doctor or health care professional if they continue or are bothersome):  bone pain  constipation  decreased appetite  diarrhea  muscle  pain  nausea, vomiting  tiredness This list may not describe all possible side effects. Call your doctor for medical advice about side effects. You may report side effects to FDA at 1-800-FDA-1088. Where should I keep my medicine? This drug is given in a hospital or clinic and will not be stored at home. NOTE: This sheet is a summary. It may not cover all possible information. If you have questions about this medicine, talk to your doctor, pharmacist, or health care provider.  2021 Elsevier/Gold Standard (2019-11-19 10:08:25)

## 2020-11-25 NOTE — Progress Notes (Signed)
Patient here for follow up. No new problems reported.

## 2020-11-25 NOTE — Progress Notes (Signed)
Hematology/Oncology follow-up [facility] [phone] [phone]   Patient Care Team: Henry Billet, MD as PCP - General (Internal Medicine) Henry Server, MD as Consulting Physician (Hematology and Oncology)  REFERRING PROVIDER: Albina Billet, MD  CHIEF COMPLAINTS/REASON FOR VISIT:  Follow-up for kidney cancer treatments  HISTORY OF PRESENTING ILLNESS:   Henry Moore is a  60 y.o.  male with PMH listed below was seen in consultation at the request of  Henry Billet, MD  for evaluation of kidney cancer Extensive medical records were reviewed Patient had MRI lumbar spine without contrast done on 07/13/2018 which showed mired lumbar degenerative disease. CT thoracic spine was done on 08/05/2018 which showed 5 cm left renal mass. 09/04/2018 CT abdomen and pelvis with and without contrast showed 5 cm round enhancing solid mass in the left kidney.  No involvement of left renal vein.  No lymphadenopathy Morphologic changes consistent with cirrhosis and the portal hypertension.  Small volume ascites and splenomegaly. . 10/16/2018 Left nephrectomy showed renal cell carcinoma, clear-cell type, nuclear grade 4, tumor extends into the renal vein and renal sinus fat.  Urethral, vascular and or resection margins are negative for tumor pT3a Nx Mx  01/28/2019 patient had another CT chest abdomen pelvis done with contrast Showed interval left nephrectomy.  Scattered tiny pulmonary nodules bilaterally.  Nonspecific.  No other evidence of metastatic disease.  Cirrhosis changes extensive coronary and aortic atherosclerosis  07/14/2019 patient underwent surveillance CT chest abdomen pelvis without contrast Interval progression of pulmonary metastasis with new enlarged right paratracheal lymph node 1.7 cm, previously 0.6 cm one-point since worrisome for metastatic adenopathy.  Multiple progressive pulmonary nodules, index nodule within the lingula measures 1.3 cm, previously  3 mm. Index subpleural nodule within the anterior right upper lobe measuring 1.8 cm, previously 3 cm Index nodule within the post lateral right lower lobe measuring 5 mm, new from previous care Aortic sclerosis.  Three-vessel coronary artery calcification noted.  Cirrhosis changes.  Splenomegaly.  Patient was referred to establish care with medical oncology for further discussion and evaluation of metastatic renal cell carcinoma. Patient reports feeling well at baseline today.  Denies any shortness of breath, cough, hemoptysis, back pain, headache. He quitted smoking 2015.  Not currently actively drinking alcohol as well. Patient has chronic back pain unchanged.  #08/14/2019-10/16/2019 nivolumab and ipilimumab. #11/06/2019-nivolumab every 2 weeks maintenance.  # Liver cirrhosis with small amount of ascites/splenomegaly patient sees gastroenterology Dr. Vicente Moore.  . He will get ultrasound surveillance due in February 2021 for screening of [name].  EGD surveillance for Barrett's plan in April 2021.  # Diabetes, on Humalog sliding scale and metformin.  #Family history of cancer and personal history of RCC.  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  Status post ipilimumab and nivolumab treatment.  # 10/29/2019 CT chest abdomen pelvis without contrast images were independently reviewed by me and discussed with patient.   CT showed marked improvement with resolution of many of the pulmonary nodules, prominent reduced size of other nodules.  Considerably reduced size of the right lower paratracheal lymph node now 0.9 cm in diameter. Hepatic cirrhosis with prominent splenomegaly and trace perisplenic ascites. Chronic CT findings includes aortic atherosclerotic vascular  disease.  Mild sigmoid colon diverticulosis  # CKD, he  establish care with Dr. Candiss Moore for chronic kidney disease.  Blood pressure medication has been adjusted.  Hydrochlorothiazide decreased to 12.5 mg daily.  # NGS -foundation one PD-L1 TPS 0% no reportable mutation, MS stable. TMB 8 muts/mb VHL [id number]*16, SETD2 BAP Genetic testing is negative.   # 07/16/2020, CT chest abdomen pelvis images were reviewed and discussed with patient Compared to July 2021 image, he has had a new patchy groundglass opacities throughout both lungs with central part of solids/airspace components.  Findings are nonspecific and could be secondary to an atypical infection, including viral pneumonia or inflammation.  Patient is asymptomatic.  I wonder the changes are secondary to his Covid infection.  Patient has been started on Levaquin to cover atypical infection by on-call physician and I recommend him to continue and complete the course. Proceed with today's immunotherapy nivolumab.  Advised patient to notify me if his symptoms get worse.  # 10/12/2020 CT chest abdomen pelvis with out contrast. New 15 mm lytic lesion in the right T9 vertebral body.  Suspicious for isolated lytic metastasis.  No findings of suspicious soft tissue metastasis in the chest abdomen or pelvis.  Residual postinfectious inflammatory scarring in the lung bilaterally.  Cirrhosis.  Splenomegaly.  Minimal ascites.  # 10/26/20 Bone scan showed Minimal increased activity noted at the right costovertebral junction at the T9 level is most likely degenerative. Similar finding noted on prior bone scan of 08/11/2019. No definite T9 vertebral body abnormal activity corresponding to lytic lesion noted on recent CT. Again the recently identified T9 vertebral body lytic lesion is suspicious and further evaluation of the thoracic spine with MRI can be obtained. No other bony abnormalities noted by bone scan. I discussed patient's case on tumor board 11/04/2020.  Bone  scan probably is not sensitive in detecting lytic lesion from [place].  Consensus reached upon clinically based on the CT scan, new bone lesion is highly likely metastasis from [place].  Options including proceed with bone lesion biopsy versus referral to radiation oncology for empiric palliative radiation.  Patient has other comorbidities including cirrhosis/pancytopenia, patient opts to proceed with empiric palliative radiation.  I will refer him to establish care with radiation oncology.  INTERVAL HISTORY Henry Moore is a 60 y.o. male who has above history reviewed by me today presents for follow up visit for management of stage IV RCC  Problems and complaints are listed below: Chronic back pain is unchanged.  Patient has establish care with radiation oncology and a plan to palliative radiation next week. No other new complaints.   Review of Systems  Constitutional: Positive for fatigue. Negative for appetite change, chills, fever and unexpected weight change.  HENT:   Negative for hearing loss and voice change.   Eyes: Negative for eye problems and icterus.  Respiratory: Negative for chest tightness, cough and shortness of breath.   Cardiovascular: Negative for chest pain and leg swelling.  Gastrointestinal: Negative for abdominal distention and abdominal pain.  Endocrine: Negative for hot flashes.  Genitourinary: Negative for difficulty urinating, dysuria and frequency.   Musculoskeletal: Positive for arthralgias.       Chronic back pain  Skin: Negative for itching and rash.       Hair loss  Neurological: Negative for light-headedness and numbness.  Hematological: Negative for adenopathy. Does not bruise/bleed easily.  Psychiatric/Behavioral: Negative for confusion.    MEDICAL HISTORY:  Past Medical History:  Diagnosis Date  . Cough    SINUS DRAINAGE CAUSING COUGHING , REPORTS CLEAR MUCOUS   .  Diabetes mellitus without complication ([place])   . Family history of breast cancer   . Family  history of colon cancer   . Family history of stomach cancer   . HTN (hypertension)   . Hyperlipemia   . Left renal mass   . Platelets decreased ([place])    DENIES UNSUAL BLEEDING   . PONV (postoperative nausea and vomiting)   . Renal cell carcinoma ([place]) 08/04/2019  . WBC decreased     SURGICAL HISTORY: Past Surgical History:  Procedure Laterality Date  . ANKLE SURGERY Left   . ANTERIOR CERVICAL DECOMP/DISCECTOMY FUSION N/A 04/16/2014   Procedure: ANTERIOR CERVICAL DECOMPRESSION/DISCECTOMY FUSION  (ACDF C5-C7)   (2 LEVELS)     ;  Surgeon: Melina Schools, MD;  Location: [facility];  Service: Orthopedics;  Laterality: N/A;  . APPENDECTOMY    . BACK SURGERY    . ESOPHAGOGASTRODUODENOSCOPY (EGD) WITH PROPOFOL N/A 02/06/2019   Procedure: ESOPHAGOGASTRODUODENOSCOPY (EGD) WITH PROPOFOL;  Surgeon: [name], MD;  Location[facility]COPY;  Service: Gastroenterology;  Laterality: N/A;  . ESOPHAGOGASTRODUODENOSCOPY (EGD) WITH PROPOFOL N/A 03/04/2019   Procedure: ESOPHAGOGASTRODUODENOSCOPY (EGD) WITH PROPOFOL;  Surgeon: Lin Landsman, MD;  Location: [facility] ENDOSCOPY;  Service: Gastroenterology;  Laterality: N/A;  . ESOPHAGOGASTRODUODENOSCOPY (EGD) WITH PROPOFOL N/A 04/22/2019   Procedure: ESOPHAGOGASTRODUODENOSCOPY (EGD) WITH PROPOFOL;  Surgeon: [name], MD;  Location: [facility] ENDOSCOPY;  Service: Gastroenterology;  Laterality: N/A;  . FRACTURE SURGERY    . HYDROCELE EXCISION    . KNEE ARTHROSCOPY Bilateral   . LAPAROSCOPIC NEPHRECTOMY, HAND ASSISTED Left 10/16/2018   Procedure: LEFT HAND ASSISTED LAPAROSCOPIC NEPHRECTOMY;  Surgeon: Lucas Mallow, MD;  Location: [facility];  Service: Urology;  Laterality: Left;  . NASAL SINUS SURGERY     X 2  . 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 PORTA CATH INSERTION N/A 11/11/2019   Procedure: PORTA CATH INSERTION;  Surgeon: Katha Cabal, MD;  Location: [place] [facility] LAB;  Service: Cardiovascular;  Laterality: N/A;  . SHOULDER SURGERY Left      SOCIAL HISTORY: Social History   Socioeconomic History  . Marital status: Married    Spouse name: Not on file  . Number of children: Not on file  . Years of education: Not on file  . Highest education level: Not on file  Occupational History  . Not on file  Tobacco Use  . Smoking status: Former Smoker    Packs/day: 0.25    Years: 20.00    Pack years: 5.00    Quit date: 2015    Years since quitting: 7.3  . Smokeless tobacco: Never Used  Vaping Use  . Vaping Use: Never used  Substance and Sexual Activity  . Alcohol use: Not Currently    Alcohol/week: 0.0 standard drinks    Comment: no alchol in 1 year  . Drug use: No  . Sexual activity: Not on file  Other Topics Concern  . Not on file  Social History Narrative  . Not on file   Social Determinants of Health   Financial Resource Strain: Not on file  Food Insecurity: Not on file  Transportation Needs: Not on file  Physical Activity: Not on file  Stress: Not on file  Social Connections: Not on file  Intimate Partner Violence: Not on file    FAMILY HISTORY: Family History  Problem Relation Age of Onset  . Diabetes Mother   . Cancer Mother        neuroendocrine cancer  . Cancer Father  neuroendocrine cancer of meninges  . Cancer Maternal Grandmother        tomach dx 20  . Alzheimer's disease Paternal Grandmother   . Lung cancer Paternal Grandfather   . Lung cancer Maternal Aunt   . Colon cancer Maternal Uncle   . Breast cancer Paternal Aunt        both dx 22s  . Ovarian cancer Other        dx 64  . Breast cancer Cousin     ALLERGIES:  is allergic to codeine and mucinex [guaifenesin er].  MEDICATIONS:  Current Outpatient Medications  Medication Sig Dispense Refill  . ACCU-CHEK AVIVA PLUS test strip CHECK BL00D SUGAR ONCE OR TWICE DAILY. 100 each PRN  . calcium carbonate (OS-CAL - DOSED IN MG OF ELEMENTAL CALCIUM) 1250 (500 Ca) MG tablet Take 1 tablet by mouth.    . carvedilol (COREG) 25 MG  tablet Take 1 tablet (25 mg total) by mouth 2 (two) times daily with a meal. 180 tablet 3  . Cholecalciferol (VITAMIN D3) 125 MCG (5000 UT) CAPS Take 5,000 Units by mouth daily.    . diclofenac Sodium (VOLTAREN) 1 % GEL Apply 2 g topically 4 (four) times daily. 100 g 2  . glipiZIDE (GLUCOTROL) 5 MG tablet Take 1 tablet (5 mg total) by mouth daily before breakfast. 90 tablet 3  . losartan (COZAAR) 100 MG tablet Take 1 tablet by mouth daily. 90 tablet 0  . lovastatin (MEVACOR) 40 MG tablet Take 1 tablet (40 mg total) by mouth at bedtime. 60 tablet 0  . metFORMIN (GLUCOPHAGE-XR) 500 MG 24 hr tablet Take 500 mg by mouth 2 (two) times daily. 2 QAM, 2 QHS    . vitamin B-12 (CYANOCOBALAMIN) 1000 MCG tablet Take 1 tablet (1,000 mcg total) by mouth daily. 90 tablet 0  . amLODipine (NORVASC) 10 MG tablet Take 10 mg by mouth daily. (Patient not taking: No sig reported)    . hydrochlorothiazide (HYDRODIURIL) 25 MG tablet TAKE 1 TABLET DAILY. (Patient not taking: No sig reported) 90 tablet 0  . insulin aspart (NOVOLOG) 100 UNIT/ML injection Inject into the skin. Inject under the skin 3 (three) times a day before meals Sliding scale, Daughters insulin, using while sugar is very out of wack from cancer treatments, temporary (Patient not taking: No sig reported)    . insulin detemir (LEVEMIR) 100 UNIT/ML injection Inject into the skin. Inject 15 units under the skin every night at night, temporary, daughter's insulin (Patient not taking: No sig reported)    . insulin lispro (INSULIN LISPRO) 100 UNIT/ML KwikPen Junior Inject into the skin 3 (three) times daily as needed (Above 250). Sliding scale (Patient not taking: No sig reported)    . ofloxacin (OCUFLOX) 0.3 % ophthalmic solution Dry eye (Patient not taking: No sig reported)    . prochlorperazine (COMPAZINE) 10 MG tablet Take 1 tablet (10 mg total) by mouth every 6 (six) hours as needed for nausea or vomiting. (Patient not taking: No sig reported) 30 tablet 0    No current facility-administered medications for this visit.   Facility-Administered Medications Ordered in Other Visits  Medication Dose Route Frequency Provider Last Rate Last Admin  . sodium chloride flush (NS) 0.9 % injection 10 mL  10 mL Intravenous PRN Henry Server, MD   10 mL at 01/22/20 0831     PHYSICAL EXAMINATION: ECOG PERFORMANCE STATUS: 1 - Symptomatic but completely ambulatory Vitals:   11/25/20 0846  BP: 128/64  Pulse: 70  Resp: 18  Temp: (!) 96.9 F (36.1 C)   Filed Weights   11/25/20 0846  Weight: 246 lb 11.2 oz (111.9 kg)    Physical Exam Constitutional:      General: He is not in acute distress.    Appearance: He is obese.  HENT:     Head: Normocephalic and atraumatic.  Eyes:     General: No scleral icterus. Cardiovascular:     Rate and Rhythm: Normal rate and regular rhythm.     Heart sounds: Normal heart sounds.  Pulmonary:     Effort: Pulmonary effort is normal. No respiratory distress.     Breath sounds: No wheezing.  Abdominal:     General: Bowel sounds are normal. There is no distension.     Palpations: Abdomen is soft.  Musculoskeletal:        General: No deformity. Normal range of motion.     Cervical back: Normal range of motion and neck supple.  Skin:    General: Skin is warm and dry.     Findings: No erythema or rash.  Neurological:     Mental Status: He is alert and oriented to person, place, and time. Mental status is at baseline.     Cranial Nerves: No cranial nerve deficit.     Coordination: Coordination normal.  Psychiatric:        Mood and Affect: Mood normal.     LABORATORY DATA:  I have reviewed the data as listed Lab Results  Component Value Date   WBC 1.8 (L) 11/25/2020   HGB 9.8 (L) 11/25/2020   HCT 29.2 (L) 11/25/2020   MCV 93.9 11/25/2020   PLT 56 (L) 11/25/2020   Recent Labs    04/01/20 0837 04/15/20 0853 04/29/20 0849 05/21/20 1253 10/28/20 0838 11/11/20 0802 11/25/20 0813  NA 139 135 138   < > 137  137 136  K 4.3 4.3 4.2   < > 4.3 4.4 4.9  CL 107 104 108   < > 106 105 106  CO2 21* 21* 23   < > 20* 22 22  GLUCOSE 123* 162* 109*   < > 122* 118* 125*  BUN 24* 22* 20   < > 26* 25* 23*  CREATININE 1.69* 1.87* 1.58*   < > 1.90* 1.74* 1.76*  CALCIUM 8.5* 8.6* 8.7*   < > 8.9 9.9 9.7  GFRNONAA 44* 39* 48*   < > 41* 45* 45*  GFRAA 51* 45* 55*  --   --   --   --   PROT 7.9 7.8 8.0   < > 7.5 8.0 7.8  ALBUMIN 4.0 4.0 4.3   < > 3.8 4.2 3.9  AST 31 34 29   < > 32 36 35  ALT $Re'24 27 21   'qcB$ < > $R'25 25 27  'lR$ ALKPHOS 102 117 108   < > 96 102 117  BILITOT 0.9 0.8 1.5*   < > 0.9 0.8 0.7   < > = values in this interval not displayed.   Iron/TIBC/Ferritin/ %Sat    Component Value Date/Time   IRON 60 09/02/2020 0822   IRON 105 10/15/2018 1325   TIBC 293 09/02/2020 0822   TIBC 244 (L) 10/15/2018 1325   FERRITIN 164 09/02/2020 0822   FERRITIN 588 (H) 10/15/2018 1325   IRONPCTSAT 21 09/02/2020 0822   IRONPCTSAT 43 10/15/2018 1325      RADIOGRAPHIC STUDIES: I have personally reviewed the radiological images as listed and agreed with the findings in the report.  NM Bone Scan Whole Body  Result Date: 10/27/2020 CLINICAL DATA:  Thoracic spine lytic lesion. History of left nephrectomy. EXAM: NUCLEAR MEDICINE WHOLE BODY BONE SCAN TECHNIQUE: Whole body anterior and posterior images were obtained approximately 3 hours after intravenous injection of radiopharmaceutical. RADIOPHARMACEUTICALS:  21.7 mCi Technetium-11m MDP IV COMPARISON:  CT 10/12/2020.  Bone scan 08/11/2019. FINDINGS: Prior left nephrectomy. Right renal function and excretion. Minimal increased activity noted at the costovertebral junction on the right at T9 is most likely degenerative. Similar finding noted on prior study of 08/11/2019. No definite T9 vertebral body abnormal activity corresponding to lytic lesion noted on recent CT. Again the recently identified T9 vertebral body lytic lesion is suspicious and further evaluation of the thoracic spine  with MRI can be obtained. No other bony abnormalities noted by bone scan. IMPRESSION: 1. Minimal increased activity noted at the right costovertebral junction at the T9 level is most likely degenerative. Similar finding noted on prior bone scan of 08/11/2019. No definite T9 vertebral body abnormal activity corresponding to lytic lesion noted on recent CT. Again the recently identified T9 vertebral body lytic lesion is suspicious and further evaluation of the thoracic spine with MRI can be obtained. No other bony abnormalities noted by bone scan. 2.  Prior left nephrectomy. Electronically Signed   By: Marcello Moores  Register   On: 10/27/2020 10:27   CT CHEST ABDOMEN PELVIS WO CONTRAST  Result Date: 10/12/2020 CLINICAL DATA:  RCC follow-up, status post left nephrectomy, on immunotherapy EXAM: CT CHEST, ABDOMEN AND PELVIS WITHOUT CONTRAST TECHNIQUE: Multidetector CT imaging of the chest, abdomen and pelvis was performed following the standard protocol without IV contrast. COMPARISON:  07/16/2020 FINDINGS: CT CHEST FINDINGS Cardiovascular: Heart is normal in size.  No pericardial effusion. No evidence of thoracic aortic aneurysm. Atherosclerotic calcifications of the aortic arch. Three vessel coronary atherosclerosis. Right chest port terminates at the cavoatrial junction. Mediastinum/Nodes: No suspicious mediastinal lymphadenopathy. Visualized thyroid is unremarkable. Lungs/Pleura: Faint subpleural ground-glass opacities/reticulation in the lungs bilaterally (for example, series 4/image 43), improved from the prior, favoring post infectious/inflammatory scarring related to prior atypical infection such as COVID. No suspicious pulmonary nodules. Specifically, the prior right perihilar nodule/lymph node has resolved. No focal consolidation. No pleural effusion or pneumothorax. Musculoskeletal: Cervical spine fixation hardware, incompletely visualized. Degenerative changes of the thoracic spine. 15 mm lytic lesion in the  right T9 vertebral body (series 2/image 41), new, suspicious. CT ABDOMEN PELVIS FINDINGS Hepatobiliary: Cirrhosis.  No focal hepatic lesion is seen. Suspected tiny layering gallstone (series 2/image 67), without associated inflammatory changes. No intrahepatic or extrahepatic duct dilatation. Pancreas: Within normal limits. Spleen: Enlarged, measuring 20.3 cm in maximal craniocaudal dimension. Adrenals/Urinary Tract: Adrenal glands are within normal limits. Status post left nephrectomy. No abnormal soft tissue in the surgical bed. Right kidney is within normal limits. No renal calculi or hydronephrosis. Bladder is within normal limits. Stomach/Bowel: Stomach is within normal limits. No evidence of bowel obstruction. Normal appendix (series 2/image 96). No colonic wall thickening or inflammatory changes. Vascular/Lymphatic: Side aneurysm Atherosclerotic calcifications of the abdominal aorta and branch vessels. No suspicious abdominopelvic lymphadenopathy. Reproductive: Prostate is unremarkable. Penile prosthesis. Other: Minimal perihepatic ascites. Mild mesenteric stranding with trace left pelvic ascites (series 2/image 111). Musculoskeletal: Mild degenerative changes of the lumbar spine. Tiny lytic lesion along the inferior aspect of the L3 vertebral body (series 6/image 149), unchanged. IMPRESSION: Status post left nephrectomy. New 15 mm lytic lesion in the right T9 vertebral body, suspicious for isolated lytic metastasis. No findings suspicious for  soft tissue metastases in the chest, abdomen, or pelvis. Residual post infectious inflammatory scarring in the lungs bilaterally. Cirrhosis.  Splenomegaly.  Minimal abdominopelvic ascites. Additional ancillary findings as above. Electronically Signed   By: Julian Hy M.D.   On: 10/12/2020 12:27     ASSESSMENT & PLAN:  1. Renal cell carcinoma of left kidney (HCC)   2. Encounter for antineoplastic immunotherapy   3. Bone lesion   4. Thrombocytopenia ([place])    5. Other neutropenia ([place])   6. 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 Proceed with immunotherapy nivolumab today.  #T9 lesion, patient will proceed with palliative radiation next week. #Chronic thrombocytopenia and neutropenia,  due to liver cirrhosis Stable counts.  Monitor.  #Chronic kidney disease, stable level  Encourage oral hydration and avoid nephrotoxin.  #Anemia, chronic, hemoglobin is 9.8, stable  #Diabetes,  He is off metformin.  Glucose is 125. Follow-up 4 week lab MD for assessment evaluation prior to nivolumab. All questions were answered. The patient knows to call the clinic with any problems questions or concerns.   Henry Server, MD, PhD Hematology Oncology [facility] Pager- [phone] 11/25/2020

## 2020-11-29 ENCOUNTER — Ambulatory Visit
Admission: RE | Admit: 2020-11-29 | Discharge: 2020-11-29 | Disposition: A | Discharge status: Home / Self Care | Payer: Medicare Other | Source: Ambulatory Visit | Attending: Radiation Oncology | Admitting: Radiation Oncology

## 2020-11-29 DIAGNOSIS — Z510 Encounter for antineoplastic radiation therapy: Secondary | ICD-10-CM | POA: Diagnosis not present

## 2020-11-29 DIAGNOSIS — C7800 Secondary malignant neoplasm of unspecified lung: Secondary | ICD-10-CM | POA: Insufficient documentation

## 2020-11-29 DIAGNOSIS — C7951 Secondary malignant neoplasm of bone: Secondary | ICD-10-CM | POA: Insufficient documentation

## 2020-11-29 DIAGNOSIS — C642 Malignant neoplasm of left kidney, except renal pelvis: Secondary | ICD-10-CM | POA: Insufficient documentation

## 2020-12-01 ENCOUNTER — Ambulatory Visit
Admission: RE | Admit: 2020-12-01 | Discharge: 2020-12-01 | Disposition: A | Discharge status: Home / Self Care | Payer: Medicare Other | Source: Ambulatory Visit | Attending: Radiation Oncology | Admitting: Radiation Oncology

## 2020-12-01 DIAGNOSIS — Z510 Encounter for antineoplastic radiation therapy: Secondary | ICD-10-CM | POA: Diagnosis not present

## 2020-12-03 ENCOUNTER — Other Ambulatory Visit: Payer: Self-pay

## 2020-12-03 ENCOUNTER — Inpatient Hospital Stay: Payer: Medicare Other | Attending: Hospice and Palliative Medicine | Admitting: Hospice and Palliative Medicine

## 2020-12-03 DIAGNOSIS — C642 Malignant neoplasm of left kidney, except renal pelvis: Secondary | ICD-10-CM | POA: Insufficient documentation

## 2020-12-03 DIAGNOSIS — M47816 Spondylosis without myelopathy or radiculopathy, lumbar region: Secondary | ICD-10-CM | POA: Insufficient documentation

## 2020-12-03 DIAGNOSIS — C7951 Secondary malignant neoplasm of bone: Secondary | ICD-10-CM | POA: Insufficient documentation

## 2020-12-03 DIAGNOSIS — D709 Neutropenia, unspecified: Secondary | ICD-10-CM | POA: Insufficient documentation

## 2020-12-03 DIAGNOSIS — Z515 Encounter for palliative care: Secondary | ICD-10-CM | POA: Diagnosis not present

## 2020-12-03 DIAGNOSIS — R188 Other ascites: Secondary | ICD-10-CM | POA: Insufficient documentation

## 2020-12-03 DIAGNOSIS — I129 Hypertensive chronic kidney disease with stage 1 through stage 4 chronic kidney disease, or unspecified chronic kidney disease: Secondary | ICD-10-CM | POA: Insufficient documentation

## 2020-12-03 DIAGNOSIS — M549 Dorsalgia, unspecified: Secondary | ICD-10-CM | POA: Insufficient documentation

## 2020-12-03 DIAGNOSIS — K7460 Unspecified cirrhosis of liver: Secondary | ICD-10-CM | POA: Insufficient documentation

## 2020-12-03 DIAGNOSIS — I2510 Atherosclerotic heart disease of native coronary artery without angina pectoris: Secondary | ICD-10-CM | POA: Insufficient documentation

## 2020-12-03 DIAGNOSIS — C7800 Secondary malignant neoplasm of unspecified lung: Secondary | ICD-10-CM | POA: Insufficient documentation

## 2020-12-03 DIAGNOSIS — E1122 Type 2 diabetes mellitus with diabetic chronic kidney disease: Secondary | ICD-10-CM | POA: Insufficient documentation

## 2020-12-03 DIAGNOSIS — D631 Anemia in chronic kidney disease: Secondary | ICD-10-CM | POA: Insufficient documentation

## 2020-12-03 DIAGNOSIS — D696 Thrombocytopenia, unspecified: Secondary | ICD-10-CM | POA: Insufficient documentation

## 2020-12-03 DIAGNOSIS — Z803 Family history of malignant neoplasm of breast: Secondary | ICD-10-CM | POA: Insufficient documentation

## 2020-12-03 DIAGNOSIS — R161 Splenomegaly, not elsewhere classified: Secondary | ICD-10-CM | POA: Insufficient documentation

## 2020-12-03 DIAGNOSIS — Z79899 Other long term (current) drug therapy: Secondary | ICD-10-CM | POA: Insufficient documentation

## 2020-12-03 DIAGNOSIS — Z5112 Encounter for antineoplastic immunotherapy: Secondary | ICD-10-CM | POA: Insufficient documentation

## 2020-12-03 DIAGNOSIS — N1832 Chronic kidney disease, stage 3b: Secondary | ICD-10-CM | POA: Insufficient documentation

## 2020-12-03 DIAGNOSIS — Z808 Family history of malignant neoplasm of other organs or systems: Secondary | ICD-10-CM | POA: Insufficient documentation

## 2020-12-03 NOTE — Progress Notes (Signed)
Virtual Visit via telephone note  I connected with [name] on 12/03/20 at  1:00 PM EDT by telephone and verified that I am speaking with the correct person using two identifiers.   I discussed the limitations, risks, security and privacy concerns of performing an evaluation and management service by telephone and the availability of in person appointments. I also discussed with the patient that there may be a patient responsible charge related to this service. The patient expressed understanding and agreed to proceed.   History of Present Illness: Henry Moore is a 60 y.o. 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    CT 10/29/2019 revealed marked improvement in tumor burden.  CT 02/13/2020 are without findings of active malignancy.  Patient was referred to palliative care to help address goals and manage ongoing symptoms.   Observations/Objective: I called and spoke with patient by phone.   Patient is currently receiving XRT to the spine for bone mets.  He endorses pain.  He is not currently taking anything for pain.  I suggested that I could prescribe an analgesic but he says that he has a high pain tolerance and would like to hold on medications for now.  He denies other significant changes or symptomatic concerns.  Will follow.  Assessment and Plan: RCC -on treatment with nivolumab.  Followed by Dr. Tasia Catchings.    Follow Up Instructions: Virtual visit in 3 months   I discussed the assessment and treatment plan with the patient. The patient was provided an opportunity to ask questions and all were answered. The patient agreed with the plan and demonstrated an understanding of the instructions.   The patient was advised to call back or seek an in-person evaluation if the symptoms worsen or if the condition fails to improve as anticipated.  I provided 5 minutes of  non-face-to-face time during this encounter.   Irean Hong, NP

## 2020-12-06 ENCOUNTER — Ambulatory Visit
Admission: RE | Admit: 2020-12-06 | Discharge: 2020-12-06 | Disposition: A | Discharge status: Home / Self Care | Payer: Medicare Other | Source: Ambulatory Visit | Attending: Radiation Oncology | Admitting: Radiation Oncology

## 2020-12-06 DIAGNOSIS — Z510 Encounter for antineoplastic radiation therapy: Secondary | ICD-10-CM | POA: Diagnosis not present

## 2020-12-08 ENCOUNTER — Ambulatory Visit
Admission: RE | Admit: 2020-12-08 | Discharge: 2020-12-08 | Disposition: A | Discharge status: Home / Self Care | Payer: Medicare Other | Source: Ambulatory Visit | Attending: Radiation Oncology | Admitting: Radiation Oncology

## 2020-12-08 DIAGNOSIS — Z510 Encounter for antineoplastic radiation therapy: Secondary | ICD-10-CM | POA: Diagnosis not present

## 2020-12-13 ENCOUNTER — Ambulatory Visit
Admission: RE | Admit: 2020-12-13 | Discharge: 2020-12-13 | Disposition: A | Discharge status: Home / Self Care | Payer: Medicare Other | Source: Ambulatory Visit | Attending: Radiation Oncology | Admitting: Radiation Oncology

## 2020-12-13 DIAGNOSIS — Z510 Encounter for antineoplastic radiation therapy: Secondary | ICD-10-CM | POA: Diagnosis not present

## 2020-12-22 ENCOUNTER — Encounter: Payer: Self-pay | Admitting: Oncology

## 2020-12-23 ENCOUNTER — Inpatient Hospital Stay: Payer: Medicare Other

## 2020-12-23 ENCOUNTER — Inpatient Hospital Stay: Payer: Medicare Other | Admitting: Oncology

## 2020-12-23 ENCOUNTER — Encounter: Payer: Self-pay | Admitting: Oncology

## 2020-12-23 ENCOUNTER — Inpatient Hospital Stay (HOSPITAL_BASED_OUTPATIENT_CLINIC_OR_DEPARTMENT_OTHER): Payer: Medicare Other | Admitting: Oncology

## 2020-12-23 ENCOUNTER — Other Ambulatory Visit: Payer: Self-pay

## 2020-12-23 VITALS — BP 121/59 | HR 76 | Temp 97.0°F | Resp 20 | Wt 249.7 lb

## 2020-12-23 DIAGNOSIS — I2510 Atherosclerotic heart disease of native coronary artery without angina pectoris: Secondary | ICD-10-CM | POA: Diagnosis not present

## 2020-12-23 DIAGNOSIS — D631 Anemia in chronic kidney disease: Secondary | ICD-10-CM

## 2020-12-23 DIAGNOSIS — E1122 Type 2 diabetes mellitus with diabetic chronic kidney disease: Secondary | ICD-10-CM | POA: Diagnosis not present

## 2020-12-23 DIAGNOSIS — C7800 Secondary malignant neoplasm of unspecified lung: Secondary | ICD-10-CM | POA: Diagnosis not present

## 2020-12-23 DIAGNOSIS — M899 Disorder of bone, unspecified: Secondary | ICD-10-CM | POA: Diagnosis not present

## 2020-12-23 DIAGNOSIS — C642 Malignant neoplasm of left kidney, except renal pelvis: Secondary | ICD-10-CM | POA: Diagnosis not present

## 2020-12-23 DIAGNOSIS — Z5112 Encounter for antineoplastic immunotherapy: Secondary | ICD-10-CM | POA: Diagnosis not present

## 2020-12-23 DIAGNOSIS — Z79899 Other long term (current) drug therapy: Secondary | ICD-10-CM | POA: Diagnosis not present

## 2020-12-23 DIAGNOSIS — C649 Malignant neoplasm of unspecified kidney, except renal pelvis: Secondary | ICD-10-CM

## 2020-12-23 DIAGNOSIS — R188 Other ascites: Secondary | ICD-10-CM | POA: Diagnosis not present

## 2020-12-23 DIAGNOSIS — D709 Neutropenia, unspecified: Secondary | ICD-10-CM | POA: Diagnosis not present

## 2020-12-23 DIAGNOSIS — K7460 Unspecified cirrhosis of liver: Secondary | ICD-10-CM | POA: Diagnosis not present

## 2020-12-23 DIAGNOSIS — I129 Hypertensive chronic kidney disease with stage 1 through stage 4 chronic kidney disease, or unspecified chronic kidney disease: Secondary | ICD-10-CM | POA: Diagnosis not present

## 2020-12-23 DIAGNOSIS — N1832 Chronic kidney disease, stage 3b: Secondary | ICD-10-CM

## 2020-12-23 DIAGNOSIS — M549 Dorsalgia, unspecified: Secondary | ICD-10-CM | POA: Diagnosis not present

## 2020-12-23 DIAGNOSIS — Z808 Family history of malignant neoplasm of other organs or systems: Secondary | ICD-10-CM | POA: Diagnosis not present

## 2020-12-23 DIAGNOSIS — D696 Thrombocytopenia, unspecified: Secondary | ICD-10-CM | POA: Diagnosis not present

## 2020-12-23 DIAGNOSIS — R161 Splenomegaly, not elsewhere classified: Secondary | ICD-10-CM | POA: Diagnosis not present

## 2020-12-23 DIAGNOSIS — D708 Other neutropenia: Secondary | ICD-10-CM

## 2020-12-23 DIAGNOSIS — C7951 Secondary malignant neoplasm of bone: Secondary | ICD-10-CM | POA: Diagnosis not present

## 2020-12-23 DIAGNOSIS — M47816 Spondylosis without myelopathy or radiculopathy, lumbar region: Secondary | ICD-10-CM | POA: Diagnosis not present

## 2020-12-23 DIAGNOSIS — Z803 Family history of malignant neoplasm of breast: Secondary | ICD-10-CM | POA: Diagnosis not present

## 2020-12-23 LAB — CBC WITH DIFFERENTIAL/PLATELET
Abs Immature Granulocytes: 0.01 10*3/uL (ref 0.00–0.07)
Basophils Absolute: 0 10*3/uL (ref 0.0–0.1)
Basophils Relative: 1 %
Eosinophils Absolute: 0.1 10*3/uL (ref 0.0–0.5)
Eosinophils Relative: 5 %
HCT: 28.1 % — ABNORMAL LOW (ref 39.0–52.0)
Hemoglobin: 9.6 g/dL — ABNORMAL LOW (ref 13.0–17.0)
Immature Granulocytes: 1 %
Lymphocytes Relative: 13 %
Lymphs Abs: 0.2 10*3/uL — ABNORMAL LOW (ref 0.7–4.0)
MCH: 31.2 pg (ref 26.0–34.0)
MCHC: 34.2 g/dL (ref 30.0–36.0)
MCV: 91.2 fL (ref 80.0–100.0)
Monocytes Absolute: 0.3 10*3/uL (ref 0.1–1.0)
Monocytes Relative: 15 %
Neutro Abs: 1.2 10*3/uL — ABNORMAL LOW (ref 1.7–7.7)
Neutrophils Relative %: 65 %
Platelets: 50 10*3/uL — ABNORMAL LOW (ref 150–400)
RBC: 3.08 MIL/uL — ABNORMAL LOW (ref 4.22–5.81)
RDW: 13.8 % (ref 11.5–15.5)
WBC: 1.8 10*3/uL — ABNORMAL LOW (ref 4.0–10.5)
nRBC: 0 % (ref 0.0–0.2)

## 2020-12-23 LAB — TSH: TSH: 7.208 u[IU]/mL — ABNORMAL HIGH (ref 0.350–4.500)

## 2020-12-23 LAB — COMPREHENSIVE METABOLIC PANEL
ALT: 23 U/L (ref 0–44)
AST: 31 U/L (ref 15–41)
Albumin: 4 g/dL (ref 3.5–5.0)
Alkaline Phosphatase: 115 U/L (ref 38–126)
Anion gap: 6 (ref 5–15)
BUN: 33 mg/dL — ABNORMAL HIGH (ref 6–20)
CO2: 23 mmol/L (ref 22–32)
Calcium: 9.5 mg/dL (ref 8.9–10.3)
Chloride: 102 mmol/L (ref 98–111)
Creatinine, Ser: 1.84 mg/dL — ABNORMAL HIGH (ref 0.61–1.24)
GFR, Estimated: 42 mL/min — ABNORMAL LOW (ref >60)
Glucose, Bld: 120 mg/dL — ABNORMAL HIGH (ref 70–99)
Potassium: 4.9 mmol/L (ref 3.5–5.1)
Sodium: 131 mmol/L — ABNORMAL LOW (ref 135–145)
Total Bilirubin: 1 mg/dL (ref 0.3–1.2)
Total Protein: 7.7 g/dL (ref 6.5–8.1)

## 2020-12-23 MED ORDER — SODIUM CHLORIDE 0.9 % IV SOLN
240.0000 mg | Freq: Once | INTRAVENOUS | Status: DC
  Filled 2020-12-23: qty 24

## 2020-12-23 MED ORDER — SODIUM CHLORIDE 0.9 % IV SOLN
Freq: Once | INTRAVENOUS | Status: AC
Start: 2020-12-23 — End: 2020-12-23
  Filled 2020-12-23: qty 250

## 2020-12-23 MED ORDER — HEPARIN SOD (PORK) LOCK FLUSH 100 UNIT/ML IV SOLN
INTRAVENOUS | Status: AC
  Filled 2020-12-23: qty 5

## 2020-12-23 MED ORDER — SODIUM CHLORIDE 0.9 % IV SOLN
240.0000 mg | Freq: Once | INTRAVENOUS | Status: AC
  Administered 2020-12-23: 240 mg via INTRAVENOUS
  Filled 2020-12-23: qty 24

## 2020-12-23 MED ORDER — HEPARIN SOD (PORK) LOCK FLUSH 100 UNIT/ML IV SOLN
500.0000 [IU] | Freq: Once | INTRAVENOUS | Status: AC | PRN
  Administered 2020-12-23: 500 [IU]
  Filled 2020-12-23: qty 5

## 2020-12-23 NOTE — Progress Notes (Signed)
Hematology/Oncology follow-up [facility] [phone] [phone]   Patient Care Team: Albina Billet, MD as PCP - General (Internal Medicine) Earlie Server, MD as Consulting Physician (Hematology and Oncology)  REFERRING PROVIDER: Albina Billet, MD  CHIEF COMPLAINTS/REASON FOR VISIT:  Follow-up for kidney cancer treatments  HISTORY OF PRESENTING ILLNESS:   Henry Moore is a  60 y.o.  male with PMH listed below was seen in consultation at the request of  Albina Billet, MD  for evaluation of kidney cancer Extensive medical records were reviewed Patient had MRI lumbar spine without contrast done on 07/13/2018 which showed mired lumbar degenerative disease. CT thoracic spine was done on 08/05/2018 which showed 5 cm left renal mass. 09/04/2018 CT abdomen and pelvis with and without contrast showed 5 cm round enhancing solid mass in the left kidney.  No involvement of left renal vein.  No lymphadenopathy Morphologic changes consistent with cirrhosis and the portal hypertension.  Small volume ascites and splenomegaly. . 10/16/2018 Left nephrectomy showed renal cell carcinoma, clear-cell type, nuclear grade 4, tumor extends into the renal vein and renal sinus fat.  Urethral, vascular and or resection margins are negative for tumor pT3a Nx Mx  01/28/2019 patient had another CT chest abdomen pelvis done with contrast Showed interval left nephrectomy.  Scattered tiny pulmonary nodules bilaterally.  Nonspecific.  No other evidence of metastatic disease.  Cirrhosis changes extensive coronary and aortic atherosclerosis  07/14/2019 patient underwent surveillance CT chest abdomen pelvis without contrast Interval progression of pulmonary metastasis with new enlarged right paratracheal lymph node 1.7 cm, previously 0.6 cm one-point since worrisome for metastatic adenopathy.  Multiple progressive pulmonary nodules, index nodule within the lingula measures 1.3 cm, previously  3 mm. Index subpleural nodule within the anterior right upper lobe measuring 1.8 cm, previously 3 cm Index nodule within the post lateral right lower lobe measuring 5 mm, new from previous care Aortic sclerosis.  Three-vessel coronary artery calcification noted.  Cirrhosis changes.  Splenomegaly.  Patient was referred to establish care with medical oncology for further discussion and evaluation of metastatic renal cell carcinoma. Patient reports feeling well at baseline today.  Denies any shortness of breath, cough, hemoptysis, back pain, headache. He quitted smoking 2015.  Not currently actively drinking alcohol as well. Patient has chronic back pain unchanged.  #08/14/2019-10/16/2019 nivolumab and ipilimumab. #11/06/2019-nivolumab every 2 weeks maintenance.  # Liver cirrhosis with small amount of ascites/splenomegaly patient sees gastroenterology Dr. [name].  . He will get ultrasound surveillance due in February 2021 for screening of [name].  EGD surveillance for Barrett's plan in April 2021.  # Diabetes, on Humalog sliding scale and metformin.  #Family history of cancer and personal history of RCC.  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  Status post ipilimumab and nivolumab treatment.  # 10/29/2019 CT chest abdomen pelvis without contrast images were independently reviewed by me and discussed with patient.   CT showed marked improvement with resolution of many of the pulmonary nodules, prominent reduced size of other nodules.  Considerably reduced size of the right lower paratracheal lymph node now 0.9 cm in diameter. Hepatic cirrhosis with prominent splenomegaly and trace perisplenic ascites. Chronic CT findings includes aortic atherosclerotic vascular  disease.  Mild sigmoid colon diverticulosis  # CKD, he  establish care with Dr. Candiss Norse for chronic kidney disease.  Blood pressure medication has been adjusted.  Hydrochlorothiazide decreased to 12.5 mg daily.  # NGS -foundation one PD-L1 TPS 0% no reportable mutation, MS stable. TMB 8 muts/mb VHL D140fs*16, SETD2 BAP Genetic testing is negative.   # 07/16/2020, CT chest abdomen pelvis images were reviewed and discussed with patient Compared to July 2021 image, he has had a new patchy groundglass opacities throughout both lungs with central part of solids/airspace components.  Findings are nonspecific and could be secondary to an atypical infection, including viral pneumonia or inflammation.  Patient is asymptomatic.  I wonder the changes are secondary to his Covid infection.  Patient has been started on Levaquin to cover atypical infection by on-call physician and I recommend him to continue and complete the course. Proceed with today's immunotherapy nivolumab.  Advised patient to notify me if his symptoms get worse.  # 10/12/2020 CT chest abdomen pelvis with out contrast. New 15 mm lytic lesion in the right T9 vertebral body.  Suspicious for isolated lytic metastasis.  No findings of suspicious soft tissue metastasis in the chest abdomen or pelvis.  Residual postinfectious inflammatory scarring in the lung bilaterally.  Cirrhosis.  Splenomegaly.  Minimal ascites.  # 10/26/20 Bone scan showed Minimal increased activity noted at the right costovertebral junction at the T9 level is most likely degenerative. Similar finding noted on prior bone scan of 08/11/2019. No definite T9 vertebral body abnormal activity corresponding to lytic lesion noted on recent CT. Again the recently identified T9 vertebral body lytic lesion is suspicious and further evaluation of the thoracic spine with MRI can be obtained. No other bony abnormalities noted by bone scan. I discussed patient's case on tumor board 11/04/2020.  Bone  scan probably is not sensitive in detecting lytic lesion from [place].  Consensus reached upon clinically based on the CT scan, new bone lesion is highly likely metastasis from [place].  Options including proceed with bone lesion biopsy versus referral to radiation oncology for empiric palliative radiation.  Patient has other comorbidities including cirrhosis/pancytopenia, patient opts to proceed with empiric palliative radiation.  12/13/2020 finished RT to T9 INTERVAL HISTORY Henry Moore is a 60 y.o. male who has above history reviewed by me today presents for follow up visit for management of stage IV RCC  Problems and complaints are listed below: Chronic back pain is worse. Patient attributes to possible aggravating his baseline back DJD with certain positions. Otherwise no new complaints.  Review of Systems  Constitutional: Positive for fatigue. Negative for appetite change, chills, fever and unexpected weight change.  HENT:   Negative for hearing loss and voice change.   Eyes: Negative for eye problems and icterus.  Respiratory: Negative for chest tightness, cough and shortness of breath.   Cardiovascular: Negative for chest pain and leg swelling.  Gastrointestinal: Negative for abdominal distention and abdominal pain.  Endocrine: Negative for hot flashes.  Genitourinary: Negative for difficulty urinating, dysuria and frequency.   Musculoskeletal: Positive for arthralgias.       Chronic back pain  Skin: Negative for itching and rash.       Hair loss  Neurological: Negative for light-headedness and numbness.  Hematological: Negative for adenopathy. Does not bruise/bleed easily.  Psychiatric/Behavioral: Negative for confusion.    MEDICAL HISTORY:  Past Medical History:  Diagnosis Date  . Cough    SINUS DRAINAGE CAUSING COUGHING , REPORTS CLEAR MUCOUS   . Diabetes mellitus without complication ([place])   . Family  history of breast cancer   . Family history of colon cancer   . Family  history of stomach cancer   . HTN (hypertension)   . Hyperlipemia   . Left renal mass   . Platelets decreased ([place])    DENIES UNSUAL BLEEDING   . PONV (postoperative nausea and vomiting)   . Renal cell carcinoma ([place]) 08/04/2019  . WBC decreased     SURGICAL HISTORY: Past Surgical History:  Procedure Laterality Date  . ANKLE SURGERY Left   . ANTERIOR CERVICAL DECOMP/DISCECTOMY FUSION N/A 04/16/2014   Procedure: ANTERIOR CERVICAL DECOMPRESSION/DISCECTOMY FUSION  (ACDF C5-C7)   (2 LEVELS)     ;  Surgeon: Melina Schools, MD;  Location: [place];  Service: Orthopedics;  Laterality: N/A;  . APPENDECTOMY    . BACK SURGERY    . ESOPHAGOGASTRODUODENOSCOPY (EGD) WITH PROPOFOL N/A 02/06/2019   Procedure: ESOPHAGOGASTRODUODENOSCOPY (EGD) WITH PROPOFOL;  Surgeon: [name], MD;  Location: [facility] ENDOSCOPY;  Service: Gastroenterology;  Laterality: N/A;  . ESOPHAGOGASTRODUODENOSCOPY (EGD) WITH PROPOFOL N/A 03/04/2019   Procedure: ESOPHAGOGASTRODUODENOSCOPY (EGD) WITH PROPOFOL;  Surgeon: Lin Landsman, MD;  Location: [facility] ENDOSCOPY;  Service: Gastroenterology;  Laterality: N/A;  . ESOPHAGOGASTRODUODENOSCOPY (EGD) WITH PROPOFOL N/A 04/22/2019   Procedure: ESOPHAGOGASTRODUODENOSCOPY (EGD) WITH PROPOFOL;  Surgeon: [name], MD;  Location: [facility] ENDOSCOPY;  Service: Gastroenterology;  Laterality: N/A;  . FRACTURE SURGERY    . HYDROCELE EXCISION    . KNEE ARTHROSCOPY Bilateral   . LAPAROSCOPIC NEPHRECTOMY, HAND ASSISTED Left 10/16/2018   Procedure: LEFT HAND ASSISTED LAPAROSCOPIC NEPHRECTOMY;  Surgeon: Lucas Mallow, MD;  Location: [facility] ORS;  Service: Urology;  Laterality: Left;  . NASAL SINUS SURGERY     X 2  . 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 PORTA CATH INSERTION N/A 11/11/2019   Procedure: PORTA CATH INSERTION;  Surgeon: Katha Cabal, MD;  Location: [facility] CV LAB;  Service: Cardiovascular;  Laterality: N/A;  . SHOULDER SURGERY Left     SOCIAL HISTORY: Social History    Socioeconomic History  . Marital status: Married    Spouse name: Not on file  . Number of children: Not on file  . Years of education: Not on file  . Highest education level: Not on file  Occupational History  . Not on file  Tobacco Use  . Smoking status: Former Smoker    Packs/day: 0.25    Years: 20.00    Pack years: 5.00    Quit date: 2015    Years since quitting: 7.4  . Smokeless tobacco: Never Used  Vaping Use  . Vaping Use: Never used  Substance and Sexual Activity  . Alcohol use: Not Currently    Alcohol/week: 0.0 standard drinks    Comment: no alchol in 1 year  . Drug use: No  . Sexual activity: Not on file  Other Topics Concern  . Not on file  Social History Narrative  . Not on file   Social Determinants of Health   Financial Resource Strain: Not on file  Food Insecurity: Not on file  Transportation Needs: Not on file  Physical Activity: Not on file  Stress: Not on file  Social Connections: Not on file  Intimate Partner Violence: Not on file    FAMILY HISTORY: Family History  Problem Relation Age of Onset  . Diabetes Mother   . Cancer Mother        neuroendocrine cancer  . Cancer Father        neuroendocrine cancer  of meninges  . Cancer Maternal Grandmother        tomach dx 65  . Alzheimer's disease Paternal Grandmother   . Lung cancer Paternal Grandfather   . Lung cancer Maternal Aunt   . Colon cancer Maternal Uncle   . Breast cancer Paternal Aunt        both dx 58s  . Ovarian cancer Other        dx 59  . Breast cancer Cousin     ALLERGIES:  is allergic to codeine and mucinex [guaifenesin er].  MEDICATIONS:  Current Outpatient Medications  Medication Sig Dispense Refill  . ACCU-CHEK AVIVA PLUS test strip CHECK BL00D SUGAR ONCE OR TWICE DAILY. 100 each PRN  . calcium carbonate (OS-CAL - DOSED IN MG OF ELEMENTAL CALCIUM) 1250 (500 Ca) MG tablet Take 1 tablet by mouth.    . carvedilol (COREG) 25 MG tablet Take 1 tablet (25 mg total) by  mouth 2 (two) times daily with a meal. 180 tablet 3  . Cholecalciferol (VITAMIN D3) 125 MCG (5000 UT) CAPS Take 5,000 Units by mouth daily.    Marland Kitchen glipiZIDE (GLUCOTROL) 5 MG tablet Take 1 tablet (5 mg total) by mouth daily before breakfast. 90 tablet 3  . losartan (COZAAR) 100 MG tablet Take 1 tablet by mouth daily. 90 tablet 0  . lovastatin (MEVACOR) 40 MG tablet Take 1 tablet (40 mg total) by mouth at bedtime. 60 tablet 0  . metFORMIN (GLUCOPHAGE-XR) 500 MG 24 hr tablet Take 500 mg by mouth 2 (two) times daily. 2 QAM, 2 QHS    . vitamin B-12 (CYANOCOBALAMIN) 1000 MCG tablet Take 1 tablet (1,000 mcg total) by mouth daily. 90 tablet 0  . amLODipine (NORVASC) 10 MG tablet Take 10 mg by mouth daily. (Patient not taking: No sig reported)    . diclofenac Sodium (VOLTAREN) 1 % GEL Apply 2 g topically 4 (four) times daily. (Patient not taking: Reported on 12/23/2020) 100 g 2  . hydrochlorothiazide (HYDRODIURIL) 25 MG tablet TAKE 1 TABLET DAILY. (Patient not taking: No sig reported) 90 tablet 0  . insulin aspart (NOVOLOG) 100 UNIT/ML injection Inject into the skin. Inject under the skin 3 (three) times a day before meals Sliding scale, Daughters insulin, using while sugar is very out of wack from cancer treatments, temporary (Patient not taking: No sig reported)    . insulin detemir (LEVEMIR) 100 UNIT/ML injection Inject into the skin. Inject 15 units under the skin every night at night, temporary, daughter's insulin (Patient not taking: No sig reported)    . insulin lispro (INSULIN LISPRO) 100 UNIT/ML KwikPen Junior Inject into the skin 3 (three) times daily as needed (Above 250). Sliding scale (Patient not taking: No sig reported)    . ofloxacin (OCUFLOX) 0.3 % ophthalmic solution Dry eye (Patient not taking: No sig reported)    . prochlorperazine (COMPAZINE) 10 MG tablet Take 1 tablet (10 mg total) by mouth every 6 (six) hours as needed for nausea or vomiting. (Patient not taking: No sig reported) 30 tablet 0    No current facility-administered medications for this visit.   Facility-Administered Medications Ordered in Other Visits  Medication Dose Route Frequency Provider Last Rate Last Admin  . sodium chloride flush (NS) 0.9 % injection 10 mL  10 mL Intravenous PRN Earlie Server, MD   10 mL at 01/22/20 0831     PHYSICAL EXAMINATION: ECOG PERFORMANCE STATUS: 1 - Symptomatic but completely ambulatory Vitals:   12/23/20 0822  BP: (!) 121/59  Pulse: 76  Resp: 20  Temp: (!) 97 F (36.1 C)  SpO2: 100%   Filed Weights   12/23/20 0822  Weight: 249 lb 10.7 oz (113.3 kg)    Physical Exam Constitutional:      General: He is not in acute distress.    Appearance: He is obese.  HENT:     Head: Normocephalic and atraumatic.  Eyes:     General: No scleral icterus. Cardiovascular:     Rate and Rhythm: Normal rate and regular rhythm.     Heart sounds: Normal heart sounds.  Pulmonary:     Effort: Pulmonary effort is normal. No respiratory distress.     Breath sounds: No wheezing.  Abdominal:     General: Bowel sounds are normal. There is no distension.     Palpations: Abdomen is soft.  Musculoskeletal:        General: No deformity. Normal range of motion.     Cervical back: Normal range of motion and neck supple.  Skin:    General: Skin is warm and dry.     Findings: No erythema or rash.  Neurological:     Mental Status: He is alert and oriented to person, place, and time. Mental status is at baseline.     Cranial Nerves: No cranial nerve deficit.     Coordination: Coordination normal.  Psychiatric:        Mood and Affect: Mood normal.     LABORATORY DATA:  I have reviewed the data as listed Lab Results  Component Value Date   WBC 1.8 (L) 12/23/2020   HGB 9.6 (L) 12/23/2020   HCT 28.1 (L) 12/23/2020   MCV 91.2 12/23/2020   PLT 50 (L) 12/23/2020   Recent Labs    04/01/20 0837 04/15/20 0853 04/29/20 0849 05/21/20 1253 10/28/20 0838 11/11/20 0802 11/25/20 0813  NA 139 135  138   < > 137 137 136  K 4.3 4.3 4.2   < > 4.3 4.4 4.9  CL 107 104 108   < > 106 105 106  CO2 21* 21* 23   < > 20* 22 22  GLUCOSE 123* 162* 109*   < > 122* 118* 125*  BUN 24* 22* 20   < > 26* 25* 23*  CREATININE 1.69* 1.87* 1.58*   < > 1.90* 1.74* 1.76*  CALCIUM 8.5* 8.6* 8.7*   < > 8.9 9.9 9.7  GFRNONAA 44* 39* 48*   < > 41* 45* 45*  GFRAA 51* 45* 55*  --   --   --   --   PROT 7.9 7.8 8.0   < > 7.5 8.0 7.8  ALBUMIN 4.0 4.0 4.3   < > 3.8 4.2 3.9  AST 31 34 29   < > 32 36 35  ALT $Re'24 27 21   'NUQ$ < > $R'25 25 27  'oX$ ALKPHOS 102 117 108   < > 96 102 117  BILITOT 0.9 0.8 1.5*   < > 0.9 0.8 0.7   < > = values in this interval not displayed.   Iron/TIBC/Ferritin/ %Sat    Component Value Date/Time   IRON 60 09/02/2020 0822   IRON 105 10/15/2018 1325   TIBC 293 09/02/2020 0822   TIBC 244 (L) 10/15/2018 1325   FERRITIN 164 09/02/2020 0822   FERRITIN 588 (H) 10/15/2018 1325   IRONPCTSAT 21 09/02/2020 0822   IRONPCTSAT 43 10/15/2018 1325      RADIOGRAPHIC STUDIES: I have personally reviewed the radiological images as  listed and agreed with the findings in the report. NM Bone Scan Whole Body  Result Date: 10/27/2020 CLINICAL DATA:  Thoracic spine lytic lesion. History of left nephrectomy. EXAM: NUCLEAR MEDICINE WHOLE BODY BONE SCAN TECHNIQUE: Whole body anterior and posterior images were obtained approximately 3 hours after intravenous injection of radiopharmaceutical. RADIOPHARMACEUTICALS:  21.7 mCi Technetium-40m MDP IV COMPARISON:  CT 10/12/2020.  Bone scan 08/11/2019. FINDINGS: Prior left nephrectomy. Right renal function and excretion. Minimal increased activity noted at the costovertebral junction on the right at T9 is most likely degenerative. Similar finding noted on prior study of 08/11/2019. No definite T9 vertebral body abnormal activity corresponding to lytic lesion noted on recent CT. Again the recently identified T9 vertebral body lytic lesion is suspicious and further evaluation of the  thoracic spine with MRI can be obtained. No other bony abnormalities noted by bone scan. IMPRESSION: 1. Minimal increased activity noted at the right costovertebral junction at the T9 level is most likely degenerative. Similar finding noted on prior bone scan of 08/11/2019. No definite T9 vertebral body abnormal activity corresponding to lytic lesion noted on recent CT. Again the recently identified T9 vertebral body lytic lesion is suspicious and further evaluation of the thoracic spine with MRI can be obtained. No other bony abnormalities noted by bone scan. 2.  Prior left nephrectomy. Electronically Signed   By: Marcello Moores  Register   On: 10/27/2020 10:27   CT CHEST ABDOMEN PELVIS WO CONTRAST  Result Date: 10/12/2020 CLINICAL DATA:  RCC follow-up, status post left nephrectomy, on immunotherapy EXAM: CT CHEST, ABDOMEN AND PELVIS WITHOUT CONTRAST TECHNIQUE: Multidetector CT imaging of the chest, abdomen and pelvis was performed following the standard protocol without IV contrast. COMPARISON:  07/16/2020 FINDINGS: CT CHEST FINDINGS Cardiovascular: Heart is normal in size.  No pericardial effusion. No evidence of thoracic aortic aneurysm. Atherosclerotic calcifications of the aortic arch. Three vessel coronary atherosclerosis. Right chest port terminates at the cavoatrial junction. Mediastinum/Nodes: No suspicious mediastinal lymphadenopathy. Visualized thyroid is unremarkable. Lungs/Pleura: Faint subpleural ground-glass opacities/reticulation in the lungs bilaterally (for example, series 4/image 43), improved from the prior, favoring post infectious/inflammatory scarring related to prior atypical infection such as COVID. No suspicious pulmonary nodules. Specifically, the prior right perihilar nodule/lymph node has resolved. No focal consolidation. No pleural effusion or pneumothorax. Musculoskeletal: Cervical spine fixation hardware, incompletely visualized. Degenerative changes of the thoracic spine. 15 mm lytic  lesion in the right T9 vertebral body (series 2/image 41), new, suspicious. CT ABDOMEN PELVIS FINDINGS Hepatobiliary: Cirrhosis.  No focal hepatic lesion is seen. Suspected tiny layering gallstone (series 2/image 67), without associated inflammatory changes. No intrahepatic or extrahepatic duct dilatation. Pancreas: Within normal limits. Spleen: Enlarged, measuring 20.3 cm in maximal craniocaudal dimension. Adrenals/Urinary Tract: Adrenal glands are within normal limits. Status post left nephrectomy. No abnormal soft tissue in the surgical bed. Right kidney is within normal limits. No renal calculi or hydronephrosis. Bladder is within normal limits. Stomach/Bowel: Stomach is within normal limits. No evidence of bowel obstruction. Normal appendix (series 2/image 96). No colonic wall thickening or inflammatory changes. Vascular/Lymphatic: Side aneurysm Atherosclerotic calcifications of the abdominal aorta and branch vessels. No suspicious abdominopelvic lymphadenopathy. Reproductive: Prostate is unremarkable. Penile prosthesis. Other: Minimal perihepatic ascites. Mild mesenteric stranding with trace left pelvic ascites (series 2/image 111). Musculoskeletal: Mild degenerative changes of the lumbar spine. Tiny lytic lesion along the inferior aspect of the L3 vertebral body (series 6/image 149), unchanged. IMPRESSION: Status post left nephrectomy. New 15 mm lytic lesion in the right T9 vertebral body,  suspicious for isolated lytic metastasis. No findings suspicious for soft tissue metastases in the chest, abdomen, or pelvis. Residual post infectious inflammatory scarring in the lungs bilaterally. Cirrhosis.  Splenomegaly.  Minimal abdominopelvic ascites. Additional ancillary findings as above. Electronically Signed   By: Julian Hy M.D.   On: 10/12/2020 12:27     ASSESSMENT & PLAN:  1. Renal cell carcinoma of left kidney (HCC)   2. Encounter for antineoplastic immunotherapy   3. Bone lesion   4.  Thrombocytopenia ([place])   5. Other neutropenia ([place])   6. 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 Proceed with nivolumab today.   #T9 lesion, status post palliative radiation.  No significant symptom improvement yet. Chronic back pain, slightly worse, probably due to chronic degenerative disease.  Advised patient to apply Voltaren cream to the area.  Monitor symptoms. Repeat CT chest abdomen pelvis without contrast in mid June 2022  #Chronic thrombocytopenia and neutropenia,  due to liver cirrhosis His counts is overall stable.  Monitor.  #Chronic kidney disease, stable level Encourage oral hydration and avoid nephrotoxin.  #Anemia, chronic, hemoglobin is 9.6, stable  #Diabetes,  He is off metformin.  Glucose is 120.  Stable Follow-up in 2 weeks lab covering provider + nivolumab. Follow-up in 4 weeks lab MD plus nivolumab All questions were answered. The patient knows to call the clinic with any problems questions or concerns.   Earlie Server, MD, PhD Hematology Oncology [facility] at [facility] Pager- [phone] 12/23/2020

## 2020-12-23 NOTE — Patient Instructions (Signed)
[facility]  Discharge Instructions: Thank you for choosing [facility] to provide your oncology and hematology care.  If you have a lab appointment with the Cancer Center, please go directly to the Cancer Center and check in at the registration area.  Wear comfortable clothing and clothing appropriate for easy access to any Portacath or PICC line.   We strive to give you quality time with your provider. You may need to reschedule your appointment if you arrive late (15 or more minutes).  Arriving late affects you and other patients whose appointments are after yours.  Also, if you miss three or more appointments without notifying the office, you may be dismissed from the clinic at the provider's discretion.      For prescription refill requests, have your pharmacy contact our office and allow 72 hours for refills to be completed.    Today you received the following chemotherapy and/or immunotherapy agents       To help prevent nausea and vomiting after your treatment, we encourage you to take your nausea medication as directed.  BELOW ARE SYMPTOMS THAT SHOULD BE REPORTED IMMEDIATELY: *FEVER GREATER THAN 100.4 F (38 C) OR HIGHER *CHILLS OR SWEATING *NAUSEA AND VOMITING THAT IS NOT CONTROLLED WITH YOUR NAUSEA MEDICATION *UNUSUAL SHORTNESS OF BREATH *UNUSUAL BRUISING OR BLEEDING *URINARY PROBLEMS (pain or burning when urinating, or frequent urination) *BOWEL PROBLEMS (unusual diarrhea, constipation, pain near the anus) TENDERNESS IN MOUTH AND THROAT WITH OR WITHOUT PRESENCE OF ULCERS (sore throat, sores in mouth, or a toothache) UNUSUAL RASH, SWELLING OR PAIN  UNUSUAL VAGINAL DISCHARGE OR ITCHING   Items with * indicate a potential emergency and should be followed up as soon as possible or go to the Emergency Department if any problems should occur.  Please show the CHEMOTHERAPY ALERT CARD or IMMUNOTHERAPY ALERT CARD at check-in to the Emergency  Department and triage nurse.  Should you have questions after your visit or need to cancel or reschedule your appointment, please contact [facility]  [phone] and follow the prompts.  Office hours are 8:00 a.m. to 4:30 p.m. Monday - Friday. Please note that voicemails left after 4:00 p.m. may not be returned until the following business day.  We are closed weekends and major holidays. You have access to a nurse at all times for urgent questions. Please call the main number to the clinic [phone] and follow the prompts.  For any non-urgent questions, you may also contact your provider using MyChart. We now offer e-Visits for anyone 18 and older to request care online for non-urgent symptoms. For details visit [email or web address].   Also download the MyChart app! Go to the app store, search "MyChart", open the app, select [facility], and log in with your MyChart username and password.  Due to Covid, a mask is required upon entering the hospital/clinic. If you do not have a mask, one will be given to you upon arrival. For doctor visits, patients may have 1 support person aged 18 or older with them. For treatment visits, patients cannot have anyone with them due to current Covid guidelines and our immunocompromised population.  

## 2020-12-28 ENCOUNTER — Encounter: Payer: Self-pay | Admitting: Oncology

## 2020-12-29 ENCOUNTER — Other Ambulatory Visit: Payer: Self-pay

## 2020-12-29 DIAGNOSIS — C642 Malignant neoplasm of left kidney, except renal pelvis: Secondary | ICD-10-CM

## 2021-01-04 ENCOUNTER — Encounter: Payer: Self-pay | Admitting: Oncology

## 2021-01-05 ENCOUNTER — Encounter: Payer: Self-pay | Admitting: Oncology

## 2021-01-06 ENCOUNTER — Other Ambulatory Visit: Payer: Self-pay

## 2021-01-07 ENCOUNTER — Inpatient Hospital Stay: Payer: Medicare Other | Attending: Hospice and Palliative Medicine

## 2021-01-07 ENCOUNTER — Inpatient Hospital Stay: Payer: Medicare Other

## 2021-01-07 ENCOUNTER — Inpatient Hospital Stay (HOSPITAL_BASED_OUTPATIENT_CLINIC_OR_DEPARTMENT_OTHER): Payer: Medicare Other | Admitting: Oncology

## 2021-01-07 ENCOUNTER — Encounter: Payer: Self-pay | Admitting: Oncology

## 2021-01-07 ENCOUNTER — Other Ambulatory Visit: Payer: Self-pay | Admitting: Oncology

## 2021-01-07 VITALS — BP 108/62 | HR 76 | Temp 97.1°F | Resp 20 | Wt 247.2 lb

## 2021-01-07 DIAGNOSIS — Z8616 Personal history of COVID-19: Secondary | ICD-10-CM | POA: Insufficient documentation

## 2021-01-07 DIAGNOSIS — Z79899 Other long term (current) drug therapy: Secondary | ICD-10-CM | POA: Diagnosis not present

## 2021-01-07 DIAGNOSIS — R252 Cramp and spasm: Secondary | ICD-10-CM | POA: Insufficient documentation

## 2021-01-07 DIAGNOSIS — C7801 Secondary malignant neoplasm of right lung: Secondary | ICD-10-CM | POA: Insufficient documentation

## 2021-01-07 DIAGNOSIS — D649 Anemia, unspecified: Secondary | ICD-10-CM | POA: Insufficient documentation

## 2021-01-07 DIAGNOSIS — Z95828 Presence of other vascular implants and grafts: Secondary | ICD-10-CM

## 2021-01-07 DIAGNOSIS — D631 Anemia in chronic kidney disease: Secondary | ICD-10-CM | POA: Insufficient documentation

## 2021-01-07 DIAGNOSIS — K7460 Unspecified cirrhosis of liver: Secondary | ICD-10-CM | POA: Insufficient documentation

## 2021-01-07 DIAGNOSIS — Z7984 Long term (current) use of oral hypoglycemic drugs: Secondary | ICD-10-CM | POA: Diagnosis not present

## 2021-01-07 DIAGNOSIS — R161 Splenomegaly, not elsewhere classified: Secondary | ICD-10-CM | POA: Insufficient documentation

## 2021-01-07 DIAGNOSIS — D696 Thrombocytopenia, unspecified: Secondary | ICD-10-CM | POA: Diagnosis not present

## 2021-01-07 DIAGNOSIS — C642 Malignant neoplasm of left kidney, except renal pelvis: Secondary | ICD-10-CM | POA: Diagnosis not present

## 2021-01-07 DIAGNOSIS — N1832 Chronic kidney disease, stage 3b: Secondary | ICD-10-CM | POA: Insufficient documentation

## 2021-01-07 DIAGNOSIS — I2510 Atherosclerotic heart disease of native coronary artery without angina pectoris: Secondary | ICD-10-CM | POA: Diagnosis not present

## 2021-01-07 DIAGNOSIS — C7951 Secondary malignant neoplasm of bone: Secondary | ICD-10-CM | POA: Diagnosis not present

## 2021-01-07 DIAGNOSIS — D709 Neutropenia, unspecified: Secondary | ICD-10-CM | POA: Diagnosis not present

## 2021-01-07 DIAGNOSIS — R188 Other ascites: Secondary | ICD-10-CM | POA: Insufficient documentation

## 2021-01-07 DIAGNOSIS — G8929 Other chronic pain: Secondary | ICD-10-CM | POA: Diagnosis not present

## 2021-01-07 DIAGNOSIS — I129 Hypertensive chronic kidney disease with stage 1 through stage 4 chronic kidney disease, or unspecified chronic kidney disease: Secondary | ICD-10-CM | POA: Insufficient documentation

## 2021-01-07 DIAGNOSIS — C649 Malignant neoplasm of unspecified kidney, except renal pelvis: Secondary | ICD-10-CM

## 2021-01-07 DIAGNOSIS — Z5112 Encounter for antineoplastic immunotherapy: Secondary | ICD-10-CM | POA: Insufficient documentation

## 2021-01-07 DIAGNOSIS — E079 Disorder of thyroid, unspecified: Secondary | ICD-10-CM | POA: Diagnosis not present

## 2021-01-07 DIAGNOSIS — E1122 Type 2 diabetes mellitus with diabetic chronic kidney disease: Secondary | ICD-10-CM | POA: Diagnosis not present

## 2021-01-07 DIAGNOSIS — Z923 Personal history of irradiation: Secondary | ICD-10-CM | POA: Diagnosis not present

## 2021-01-07 DIAGNOSIS — E785 Hyperlipidemia, unspecified: Secondary | ICD-10-CM | POA: Insufficient documentation

## 2021-01-07 DIAGNOSIS — M549 Dorsalgia, unspecified: Secondary | ICD-10-CM | POA: Diagnosis not present

## 2021-01-07 DIAGNOSIS — Z794 Long term (current) use of insulin: Secondary | ICD-10-CM | POA: Insufficient documentation

## 2021-01-07 DIAGNOSIS — Z803 Family history of malignant neoplasm of breast: Secondary | ICD-10-CM | POA: Insufficient documentation

## 2021-01-07 LAB — COMPREHENSIVE METABOLIC PANEL
ALT: 25 U/L (ref 0–44)
AST: 32 U/L (ref 15–41)
Albumin: 3.8 g/dL (ref 3.5–5.0)
Alkaline Phosphatase: 109 U/L (ref 38–126)
Anion gap: 8 (ref 5–15)
BUN: 25 mg/dL — ABNORMAL HIGH (ref 6–20)
CO2: 22 mmol/L (ref 22–32)
Calcium: 9.3 mg/dL (ref 8.9–10.3)
Chloride: 107 mmol/L (ref 98–111)
Creatinine, Ser: 1.77 mg/dL — ABNORMAL HIGH (ref 0.61–1.24)
GFR, Estimated: 44 mL/min — ABNORMAL LOW (ref >60)
Glucose, Bld: 115 mg/dL — ABNORMAL HIGH (ref 70–99)
Potassium: 4.4 mmol/L (ref 3.5–5.1)
Sodium: 137 mmol/L (ref 135–145)
Total Bilirubin: 1.2 mg/dL (ref 0.3–1.2)
Total Protein: 7.8 g/dL (ref 6.5–8.1)

## 2021-01-07 LAB — CBC WITH DIFFERENTIAL/PLATELET
Abs Immature Granulocytes: 0.02 10*3/uL (ref 0.00–0.07)
Basophils Absolute: 0 10*3/uL (ref 0.0–0.1)
Basophils Relative: 1 %
Eosinophils Absolute: 0.1 10*3/uL (ref 0.0–0.5)
Eosinophils Relative: 5 %
HCT: 27.6 % — ABNORMAL LOW (ref 39.0–52.0)
Hemoglobin: 9.5 g/dL — ABNORMAL LOW (ref 13.0–17.0)
Immature Granulocytes: 1 %
Lymphocytes Relative: 12 %
Lymphs Abs: 0.3 10*3/uL — ABNORMAL LOW (ref 0.7–4.0)
MCH: 31.7 pg (ref 26.0–34.0)
MCHC: 34.4 g/dL (ref 30.0–36.0)
MCV: 92 fL (ref 80.0–100.0)
Monocytes Absolute: 0.3 10*3/uL (ref 0.1–1.0)
Monocytes Relative: 10 %
Neutro Abs: 2 10*3/uL (ref 1.7–7.7)
Neutrophils Relative %: 71 %
Platelets: 58 10*3/uL — ABNORMAL LOW (ref 150–400)
RBC: 3 MIL/uL — ABNORMAL LOW (ref 4.22–5.81)
RDW: 13.6 % (ref 11.5–15.5)
WBC: 2.8 10*3/uL — ABNORMAL LOW (ref 4.0–10.5)
nRBC: 0 % (ref 0.0–0.2)

## 2021-01-07 LAB — T4, FREE: Free T4: 0.96 ng/dL (ref 0.61–1.12)

## 2021-01-07 LAB — TSH: TSH: 3.636 u[IU]/mL (ref 0.350–4.500)

## 2021-01-07 MED ORDER — SODIUM CHLORIDE 0.9% FLUSH
10.0000 mL | Freq: Once | INTRAVENOUS | Status: AC
  Administered 2021-01-07: 10 mL via INTRAVENOUS
  Filled 2021-01-07: qty 10

## 2021-01-07 MED ORDER — HEPARIN SOD (PORK) LOCK FLUSH 100 UNIT/ML IV SOLN
500.0000 [IU] | Freq: Once | INTRAVENOUS | Status: DC
  Filled 2021-01-07: qty 5

## 2021-01-07 MED ORDER — SODIUM CHLORIDE 0.9 % IV SOLN
Freq: Once | INTRAVENOUS | Status: AC
  Filled 2021-01-07: qty 250

## 2021-01-07 MED ORDER — SODIUM CHLORIDE 0.9 % IV SOLN
240.0000 mg | Freq: Once | INTRAVENOUS | Status: AC
  Administered 2021-01-07: 240 mg via INTRAVENOUS
  Filled 2021-01-07: qty 24

## 2021-01-07 MED ORDER — HEPARIN SOD (PORK) LOCK FLUSH 100 UNIT/ML IV SOLN
INTRAVENOUS | Status: AC
  Filled 2021-01-07: qty 5

## 2021-01-07 MED ORDER — HEPARIN SOD (PORK) LOCK FLUSH 100 UNIT/ML IV SOLN
500.0000 [IU] | Freq: Once | INTRAVENOUS | Status: AC | PRN
  Administered 2021-01-07: 500 [IU]
  Filled 2021-01-07: qty 5

## 2021-01-07 NOTE — Patient Instructions (Signed)
[facility]    Discharge Instructions: Thank you for choosing [facility] to provide your oncology and hematology care.  If you have a lab appointment with the [facility], please go directly to the [facility] and check in at the registration area.  Wear comfortable clothing and clothing appropriate for easy access to any Portacath or PICC line.   We strive to give you quality time with your provider. You may need to reschedule your appointment if you arrive late (15 or more minutes).  Arriving late affects you and other patients whose appointments are after yours.  Also, if you miss three or more appointments without notifying the office, you may be dismissed from the clinic at the provider's discretion.      For prescription refill requests, have your pharmacy contact our office and allow 72 hours for refills to be completed.    Today you received the following chemotherapy and/or immunotherapy agents - Nivolumab      To help prevent nausea and vomiting after your treatment, we encourage you to take your nausea medication as directed.  BELOW ARE SYMPTOMS THAT SHOULD BE REPORTED IMMEDIATELY: *FEVER GREATER THAN 100.4 F (38 C) OR HIGHER *CHILLS OR SWEATING *NAUSEA AND VOMITING THAT IS NOT CONTROLLED WITH YOUR NAUSEA MEDICATION *UNUSUAL SHORTNESS OF BREATH *UNUSUAL BRUISING OR BLEEDING *URINARY PROBLEMS (pain or burning when urinating, or frequent urination) *BOWEL PROBLEMS (unusual diarrhea, constipation, pain near the anus) TENDERNESS IN MOUTH AND THROAT WITH OR WITHOUT PRESENCE OF ULCERS (sore throat, sores in mouth, or a toothache) UNUSUAL RASH, SWELLING OR PAIN  UNUSUAL VAGINAL DISCHARGE OR ITCHING   Items with * indicate a potential emergency and should be followed up as soon as possible or go to the Emergency Department if any problems should occur.  Please show the CHEMOTHERAPY ALERT CARD or IMMUNOTHERAPY ALERT CARD at  check-in to the Emergency Department and triage nurse.  Should you have questions after your visit or need to cancel or reschedule your appointment, please contact [facility]  [phone] and follow the prompts.  Office hours are 8:00 a.m. to 4:30 p.m. Monday - Friday. Please note that voicemails left after 4:00 p.m. may not be returned until the following business day.  We are closed weekends and major holidays. You have access to a nurse at all times for urgent questions. Please call the main number to the clinic [phone] and follow the prompts.  For any non-urgent questions, you may also contact your provider using MyChart. We now offer e-Visits for anyone 4 and older to request care online for non-urgent symptoms. For details visit [email or web address].   Also download the MyChart app! Go to the app store, search "MyChart", open the app, select [facility], and log in with your MyChart username and password.  Due to Covid, a mask is required upon entering the hospital/clinic. If you do not have a mask, one will be given to you upon arrival. For doctor visits, patients may have 1 support person aged 4 or older with them. For treatment visits, patients cannot have anyone with them due to current Covid guidelines and our immunocompromised population.   Nivolumab injection What is this medication? NIVOLUMAB (nye VOL ue mab) is a monoclonal antibody. It treats certain types of cancer. Some of the cancers treated are colon cancer, head and neck cancer,Hodgkin lymphoma, lung cancer, and melanoma. This medicine may be used for other purposes; ask your health care provider orpharmacist  if you have questions. COMMON BRAND NAME(S): Opdivo What should I tell my care team before I take this medication? They need to know if you have any of these conditions: autoimmune diseases like Crohn's disease, ulcerative colitis, or lupus have had or planning to have an  allogeneic stem cell transplant (uses someone else's stem cells) history of chest radiation history of organ transplant nervous system problems like myasthenia gravis or Guillain-Barre syndrome an unusual or allergic reaction to nivolumab, other medicines, foods, dyes, or preservatives pregnant or trying to get pregnant breast-feeding How should I use this medication? This medicine is for infusion into a vein. It is given by a health careprofessional in a hospital or clinic setting. A special MedGuide will be given to you before each treatment. Be sure to readthis information carefully each time. Talk to your pediatrician regarding the use of this medicine in children. While this drug may be prescribed for children as young as 12 years for selectedconditions, precautions do apply. Overdosage: If you think you have taken too much of this medicine contact apoison control center or emergency room at once. NOTE: This medicine is only for you. Do not share this medicine with others. What if I miss a dose? It is important not to miss your dose. Call your doctor or health careprofessional if you are unable to keep an appointment. What may interact with this medication? Interactions have not been studied. This list may not describe all possible interactions. Give your health care provider a list of all the medicines, herbs, non-prescription drugs, or dietary supplements you use. Also tell them if you smoke, drink alcohol, or use illegaldrugs. Some items may interact with your medicine. What should I watch for while using this medication? This drug may make you feel generally unwell. Continue your course of treatmenteven though you feel ill unless your doctor tells you to stop. You may need blood work done while you are taking this medicine. Do not become pregnant while taking this medicine or for 5 months after stopping it. Women should inform their doctor if they wish to become pregnant or think they  might be pregnant. There is a potential for serious side effects to an unborn child. Talk to your health care professional or pharmacist for more information. Do not breast-feed an infant while taking this medicine orfor 5 months after stopping it. What side effects may I notice from receiving this medication? Side effects that you should report to your doctor or health care professionalas soon as possible: allergic reactions like skin rash, itching or hives, swelling of the face, lips, or tongue breathing problems blood in the urine bloody or watery diarrhea or black, tarry stools changes in emotions or moods changes in vision chest pain cough dizziness feeling faint or lightheaded, falls fever, chills headache with fever, neck stiffness, confusion, loss of memory, sensitivity to light, hallucination, loss of contact with reality, or seizures joint pain mouth sores redness, blistering, peeling or loosening of the skin, including inside the mouth severe muscle pain or weakness signs and symptoms of high blood sugar such as dizziness; dry mouth; dry skin; fruity breath; nausea; stomach pain; increased hunger or thirst; increased urination signs and symptoms of kidney injury like trouble passing urine or change in the amount of urine signs and symptoms of liver injury like dark yellow or brown urine; general ill feeling or flu-like symptoms; light-colored stools; loss of appetite; nausea; right upper belly pain; unusually weak or tired; yellowing of the eyes or skin  swelling of the ankles, feet, hands trouble passing urine or change in the amount of urine unusually weak or tired weight gain or loss Side effects that usually do not require medical attention (report to yourdoctor or health care professional if they continue or are bothersome): bone pain constipation decreased appetite diarrhea muscle pain nausea, vomiting tiredness This list may not describe all possible side effects.  Call your doctor for medical advice about side effects. You may report side effects to FDA at1-800-FDA-1088. Where should I keep my medication? This drug is given in a hospital or clinic and will not be stored at home. NOTE: This sheet is a summary. It may not cover all possible information. If you have questions about this medicine, talk to your doctor, pharmacist, orhealth care provider.  2022 [employer] Standard (2019-11-19 10:08:25)

## 2021-01-07 NOTE — Progress Notes (Signed)
Pt received nivolumab infusion in clinic today. Tolerated well. 

## 2021-01-07 NOTE — Progress Notes (Signed)
Hematology/Oncology follow-up [facility] [phone] [phone]   Patient Care Team: Albina Billet, MD as PCP - General (Internal Medicine) Earlie Server, MD as Consulting Physician (Hematology and Oncology)  REFERRING PROVIDER: Albina Billet, MD  CHIEF COMPLAINTS/REASON FOR VISIT:  Follow-up for kidney cancer treatments  HISTORY OF PRESENTING ILLNESS:   Henry Moore is a  60 y.o.  male with PMH listed below was seen in consultation at the request of  Albina Billet, MD  for evaluation of kidney cancer Extensive medical records were reviewed Patient had MRI lumbar spine without contrast done on 07/13/2018 which showed mired lumbar degenerative disease. CT thoracic spine was done on 08/05/2018 which showed 5 cm left renal mass. 09/04/2018 CT abdomen and pelvis with and without contrast showed 5 cm round enhancing solid mass in the left kidney.  No involvement of left renal vein.  No lymphadenopathy Morphologic changes consistent with cirrhosis and the portal hypertension.  Small volume ascites and splenomegaly. . 10/16/2018 Left nephrectomy showed renal cell carcinoma, clear-cell type, nuclear grade 4, tumor extends into the renal vein and renal sinus fat.  Urethral, vascular and or resection margins are negative for tumor pT3a Nx Mx  01/28/2019 patient had another CT chest abdomen pelvis done with contrast Showed interval left nephrectomy.  Scattered tiny pulmonary nodules bilaterally.  Nonspecific.  No other evidence of metastatic disease.  Cirrhosis changes extensive coronary and aortic atherosclerosis  07/14/2019 patient underwent surveillance CT chest abdomen pelvis without contrast Interval progression of pulmonary metastasis with new enlarged right paratracheal lymph node 1.7 cm, previously 0.6 cm one-point since worrisome for metastatic adenopathy.  Multiple progressive pulmonary nodules, index nodule within the lingula measures 1.3 cm, previously  3 mm. Index subpleural nodule within the anterior right upper lobe measuring 1.8 cm, previously 3 cm Index nodule within the post lateral right lower lobe measuring 5 mm, new from previous care Aortic sclerosis.  Three-vessel coronary artery calcification noted.  Cirrhosis changes.  Splenomegaly.  Patient was referred to establish care with medical oncology for further discussion and evaluation of metastatic renal cell carcinoma. Patient reports feeling well at baseline today.  Denies any shortness of breath, cough, hemoptysis, back pain, headache. He quitted smoking 2015.  Not currently actively drinking alcohol as well. Patient has chronic back pain unchanged.  #08/14/2019-10/16/2019 nivolumab and ipilimumab. #11/06/2019-nivolumab every 2 weeks maintenance.  # Liver cirrhosis with small amount of ascites/splenomegaly patient sees gastroenterology Dr. [name].  . He will get ultrasound surveillance due in February 2021 for screening of [name].  EGD surveillance for Barrett's plan in April 2021.  # Diabetes, on Humalog sliding scale and metformin.  #Family history of cancer and personal history of RCC.  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  Status post ipilimumab and nivolumab treatment.  # 10/29/2019 CT chest abdomen pelvis without contrast images were independently reviewed by me and discussed with patient.   CT showed marked improvement with resolution of many of the pulmonary nodules, prominent reduced size of other nodules.  Considerably reduced size of the right lower paratracheal lymph node now 0.9 cm in diameter. Hepatic cirrhosis with prominent splenomegaly and trace perisplenic ascites. Chronic CT findings includes aortic atherosclerotic vascular  disease.  Mild sigmoid colon diverticulosis  # CKD, he  establish care with Dr. Singh for chronic kidney disease.  Blood pressure medication has been adjusted.  Hydrochlorothiazide decreased to 12.5 mg daily.  # NGS -foundation one PD-L1 TPS 0% no reportable mutation, MS stable. TMB 8 muts/mb VHL [id number]*16, SETD2 BAP Genetic testing is negative.   # 07/16/2020, CT chest abdomen pelvis images were reviewed and discussed with patient Compared to July 2021 image, he has had a new patchy groundglass opacities throughout both lungs with central part of solids/airspace components.  Findings are nonspecific and could be secondary to an atypical infection, including viral pneumonia or inflammation.  Patient is asymptomatic.  I wonder the changes are secondary to his Covid infection.  Patient has been started on Levaquin to cover atypical infection by on-call physician and I recommend him to continue and complete the course. Proceed with today's immunotherapy nivolumab.  Advised patient to notify me if his symptoms get worse.  # 10/12/2020 CT chest abdomen pelvis with out contrast. New 15 mm lytic lesion in the right T9 vertebral body.  Suspicious for isolated lytic metastasis.  No findings of suspicious soft tissue metastasis in the chest abdomen or pelvis.  Residual postinfectious inflammatory scarring in the lung bilaterally.  Cirrhosis.  Splenomegaly.  Minimal ascites.  # 10/26/20 Bone scan showed Minimal increased activity noted at the right costovertebral junction at the T9 level is most likely degenerative. Similar finding noted on prior bone scan of 08/11/2019. No definite T9 vertebral body abnormal activity corresponding to lytic lesion noted on recent CT. Again the recently identified T9 vertebral body lytic lesion is suspicious and further evaluation of the thoracic spine with MRI can be obtained. No other bony abnormalities noted by bone scan. I discussed patient's case on tumor board 11/04/2020.  Bone  scan probably is not sensitive in detecting lytic lesion from RCC.  Consensus reached upon clinically based on the CT scan, new bone lesion is highly likely metastasis from RCC.  Options including proceed with bone lesion biopsy versus referral to radiation oncology for empiric palliative radiation.  Patient has other comorbidities including cirrhosis/pancytopenia, patient opts to proceed with empiric palliative radiation.  12/13/2020 finished RT to T9 INTERVAL HISTORY Henry Moore is a 59 y.o. male who has is here for follow up visit for management of stage IV RCC.  He was last seen in clinic on 12/23/2020 by Dr. Yu prior to his maintenance Opdivo.   Opdivo was held from 11/25/20-12/23/20 while receiving XRT.  In the interim, he has done well.  He continues to have chronic back pain worse when turning and specific movements.  He is currently not on any narcotics and wishes to remain off at this time. Does not like the way it makes him feel. He has met with palliative care Joshua Borders to discuss pain management.  He recently completed palliative radiation SBRT to his T9 vertebral body-5 fractions. No improvement noted per patient.   Has occasional cramping in bilateral calves. No swelling or redness. No other complaints.  Review of Systems  Constitutional: Negative.  Negative for appetite change, chills, fatigue and fever.  HENT:  Negative.  Negative for hearing loss, lump/mass, mouth sores and nosebleeds.   Eyes: Negative.  Negative for eye problems.  Respiratory:  Negative for cough, hemoptysis and shortness of breath.   Cardiovascular: Negative.  Negative for chest pain and leg swelling.  Gastrointestinal: Negative.  Negative for abdominal pain, blood in stool, constipation, diarrhea, nausea and vomiting.  Endocrine: Negative.  Negative for hot flashes.    Genitourinary: Negative.  Negative for bladder incontinence, difficulty urinating, dysuria, frequency and hematuria.   Musculoskeletal:   Positive for arthralgias, back pain and neck pain. Negative for flank pain, gait problem and myalgias.  Skin: Negative.  Negative for itching and rash.  Neurological: Negative.  Negative for dizziness, gait problem, headaches, light-headedness and numbness.  Hematological: Negative.  Negative for adenopathy.  Psychiatric/Behavioral:  Negative for confusion. The patient is not nervous/anxious.    MEDICAL HISTORY:  Past Medical History:  Diagnosis Date   Cough    SINUS DRAINAGE CAUSING COUGHING , REPORTS CLEAR MUCOUS    Diabetes mellitus without complication (HCC)    Family history of breast cancer    Family history of colon cancer    Family history of stomach cancer    HTN (hypertension)    Hyperlipemia    Left renal mass    Platelets decreased ([place])    DENIES UNSUAL BLEEDING    PONV (postoperative nausea and vomiting)    Renal cell carcinoma ([place]) 08/04/2019   WBC decreased     SURGICAL HISTORY: Past Surgical History:  Procedure Laterality Date   ANKLE SURGERY Left    ANTERIOR CERVICAL DECOMP/DISCECTOMY FUSION N/A 04/16/2014   Procedure: ANTERIOR CERVICAL DECOMPRESSION/DISCECTOMY FUSION  (ACDF C5-C7)   (2 LEVELS)     ;  Surgeon: Melina Schools, MD;  Location: [facility];  Service: Orthopedics;  Laterality: N/A;   APPENDECTOMY     BACK SURGERY     ESOPHAGOGASTRODUODENOSCOPY (EGD) WITH PROPOFOL N/A 02/06/2019   Procedure: ESOPHAGOGASTRODUODENOSCOPY (EGD) WITH PROPOFOL;  Surgeon: [name], MD;  Location: [facility] END[facility]COPY;  Service: Gastroenterology;  Laterality: N/A;   ESOPHAGOGASTRODUODENOSCOPY (EGD) WITH PROPOFOL N/A 03/04/2019   Procedure: ESOPHAGOGASTRODUODENOSCOPY (EGD) WITH PROPOFOL;  Surgeon: Lin Landsman, MD;  Location: [place];  Service: Gastroenterology;  Laterality: N/A;   ESOPHAGOGASTRODUODENOSCOPY (EGD) WITH PROPOFOL N/A 04/22/2019   Procedure: ESOPHAGOGASTRODUODENOSCOPY (EGD) WITH PROPOFOL;  Surgeon: [name], MD;  Location: [facility] ENDOSCOPY;  Service:  Gastroenterology;  Laterality: N/A;   FRACTURE SURGERY     HYDROCELE EXCISION     KNEE ARTHROSCOPY Bilateral    LAPAROSCOPIC NEPHRECTOMY, HAND ASSISTED Left 10/16/2018   Procedure: LEFT HAND ASSISTED LAPAROSCOPIC NEPHRECTOMY;  Surgeon: Lucas Mallow, MD;  Location: [facility];  Service: Urology;  Laterality: Left;   NASAL SINUS SURGERY     X 2   PENILE PROSTHESIS IMPLANT  10 YEARS AGO   PORTA CATH INSERTION N/A 11/11/2019   Procedure: PORTA CATH INSERTION;  Surgeon: Katha Cabal, MD;  Location: [place] [facility];  Service: Cardiovascular;  Laterality: N/A;   SHOULDER SURGERY Left     SOCIAL HISTORY: Social History   Socioeconomic History   Marital status: Married    Spouse name: Not on file   Number of children: Not on file   Years of education: Not on file   Highest education level: Not on file  Occupational History   Not on file  Tobacco Use   Smoking status: Former    Packs/day: 0.25    Years: 20.00    Pack years: 5.00    Types: Cigarettes    Quit date: 2015    Years since quitting: 7.4   Smokeless tobacco: Never  Vaping Use   Vaping Use: Never used  Substance and Sexual Activity   Alcohol use: Not Currently    Alcohol/week: 0.0 standard drinks    Comment: no alchol in 1 year   Drug use: No   Sexual activity: Not  on file  Other Topics Concern   Not on file  Social History Narrative   Not on file   Social Determinants of Health   Financial Resource Strain: Not on file  Food Insecurity: Not on file  Transportation Needs: Not on file  Physical Activity: Not on file  Stress: Not on file  Social Connections: Not on file  Intimate Partner Violence: Not on file    FAMILY HISTORY: Family History  Problem Relation Age of Onset   Diabetes Mother    Cancer Mother        neuroendocrine cancer   Cancer Father        neuroendocrine cancer of meninges   Cancer Maternal Grandmother        tomach dx 85   Alzheimer's disease Paternal Grandmother    Lung  cancer Paternal Grandfather    Lung cancer Maternal Aunt    Colon cancer Maternal Uncle    Breast cancer Paternal Aunt        both dx 60s   Ovarian cancer Other        dx 70   Breast cancer Cousin     ALLERGIES:  is allergic to codeine and mucinex [guaifenesin er].  MEDICATIONS:  Current Outpatient Medications  Medication Sig Dispense Refill   ACCU-CHEK AVIVA PLUS test strip CHECK BL00D SUGAR ONCE OR TWICE DAILY. 100 each PRN   amLODipine (NORVASC) 10 MG tablet Take 10 mg by mouth daily. (Patient not taking: No sig reported)     calcium carbonate (OS-CAL - DOSED IN MG OF ELEMENTAL CALCIUM) 1250 (500 Ca) MG tablet Take 1 tablet by mouth.     carvedilol (COREG) 25 MG tablet Take 1 tablet (25 mg total) by mouth 2 (two) times daily with a meal. 180 tablet 3   Cholecalciferol (VITAMIN D3) 125 MCG (5000 UT) CAPS Take 5,000 Units by mouth daily.     diclofenac Sodium (VOLTAREN) 1 % GEL Apply 2 g topically 4 (four) times daily. (Patient not taking: Reported on 12/23/2020) 100 g 2   glipiZIDE (GLUCOTROL) 5 MG tablet Take 1 tablet (5 mg total) by mouth daily before breakfast. 90 tablet 3   hydrochlorothiazide (HYDRODIURIL) 25 MG tablet TAKE 1 TABLET DAILY. (Patient not taking: No sig reported) 90 tablet 0   insulin aspart (NOVOLOG) 100 UNIT/ML injection Inject into the skin. Inject under the skin 3 (three) times a day before meals Sliding scale, Daughters insulin, using while sugar is very out of wack from cancer treatments, temporary (Patient not taking: No sig reported)     insulin detemir (LEVEMIR) 100 UNIT/ML injection Inject into the skin. Inject 15 units under the skin every night at night, temporary, daughter's insulin (Patient not taking: No sig reported)     insulin lispro (INSULIN LISPRO) 100 UNIT/ML KwikPen Junior Inject into the skin 3 (three) times daily as needed (Above 250). Sliding scale (Patient not taking: No sig reported)     losartan (COZAAR) 100 MG tablet Take 1 tablet by mouth  daily. 90 tablet 0   lovastatin (MEVACOR) 40 MG tablet Take 1 tablet (40 mg total) by mouth at bedtime. 60 tablet 0   metFORMIN (GLUCOPHAGE-XR) 500 MG 24 hr tablet Take 500 mg by mouth 2 (two) times daily. 2 QAM, 2 QHS     ofloxacin (OCUFLOX) 0.3 % ophthalmic solution Dry eye (Patient not taking: No sig reported)     prochlorperazine (COMPAZINE) 10 MG tablet Take 1 tablet (10 mg total) by mouth every 6 (six)   hours as needed for nausea or vomiting. (Patient not taking: No sig reported) 30 tablet 0   vitamin B-12 (CYANOCOBALAMIN) 1000 MCG tablet Take 1 tablet (1,000 mcg total) by mouth daily. 90 tablet 0   No current facility-administered medications for this visit.   Facility-Administered Medications Ordered in Other Visits  Medication Dose Route Frequency Provider Last Rate Last Admin   heparin lock flush 100 unit/mL  500 Units Intravenous Once Earlie Server, MD       sodium chloride flush (NS) 0.9 % injection 10 mL  10 mL Intravenous PRN Earlie Server, MD   10 mL at 01/22/20 0831     PHYSICAL EXAMINATION: ECOG PERFORMANCE STATUS: 1 - Symptomatic but completely ambulatory There were no vitals filed for this visit.  There were no vitals filed for this visit.   Physical Exam Constitutional:      General: He is not in acute distress.    Appearance: He is obese.  HENT:     Head: Normocephalic and atraumatic.  Eyes:     General: No scleral icterus. Cardiovascular:     Rate and Rhythm: Normal rate and regular rhythm.     Heart sounds: Normal heart sounds.  Pulmonary:     Effort: Pulmonary effort is normal. No respiratory distress.     Breath sounds: No wheezing.  Abdominal:     General: Bowel sounds are normal. There is no distension.     Palpations: Abdomen is soft.  Musculoskeletal:        General: No deformity. Normal range of motion.     Cervical back: Normal range of motion and neck supple.  Skin:    General: Skin is warm and dry.     Findings: No erythema or rash.  Neurological:      Mental Status: He is alert and oriented to person, place, and time. Mental status is at baseline.     Cranial Nerves: No cranial nerve deficit.     Coordination: Coordination normal.  Psychiatric:        Mood and Affect: Mood normal.    LABORATORY DATA:  I have reviewed the data as listed Lab Results  Component Value Date   WBC 1.8 (L) 12/23/2020   HGB 9.6 (L) 12/23/2020   HCT 28.1 (L) 12/23/2020   MCV 91.2 12/23/2020   PLT 50 (L) 12/23/2020   Recent Labs    04/01/20 0837 04/15/20 0853 04/29/20 0849 05/21/20 1253 11/11/20 0802 11/25/20 0813 12/23/20 0808  NA 139 135 138   < > 137 136 131*  K 4.3 4.3 4.2   < > 4.4 4.9 4.9  CL 107 104 108   < > 105 106 102  CO2 21* 21* 23   < > _0 GLUCOSE 123* 162* 109*   < > 118* 125* 120*  BUN 24* 22* 20   < > 25* 23* 33*  CREATININE 1.69* 1.87* 1.58*   < > 1.74* 1.76* 1.84*  CALCIUM 8.5* 8.6* 8.7*   < > 9.9 9.7 9.5  GFRNONAA 44* 39* 48*   < > 45* 45* 42*  GFRAA 51* 45* 55*  --   --   --   --   PROT 7.9 7.8 8.0   < > 8.0 7.8 7.7  ALBUMIN 4.0 4.0 4.3   < > 4.2 3.9 4.0  AST 31 34 29   < > 36 35 31  ALT _1 < > _2 ALKPHOS 102  117 108   < > 102 117 115  BILITOT 0.9 0.8 1.5*   < > 0.8 0.7 1.0   < > = values in this interval not displayed.    Iron/TIBC/Ferritin/ %Sat    Component Value Date/Time   IRON 60 09/02/2020 0822   IRON 105 10/15/2018 1325   TIBC 293 09/02/2020 0822   TIBC 244 (L) 10/15/2018 1325   FERRITIN 164 09/02/2020 0822   FERRITIN 588 (H) 10/15/2018 1325   IRONPCTSAT 21 09/02/2020 0822   IRONPCTSAT 43 10/15/2018 1325      RADIOGRAPHIC STUDIES: I have personally reviewed the radiological images as listed and agreed with the findings in the report. NM Bone Scan Whole Body  Result Date: 10/27/2020 CLINICAL DATA:  Thoracic spine lytic lesion. History of left nephrectomy. EXAM: NUCLEAR MEDICINE WHOLE BODY BONE SCAN TECHNIQUE: Whole body anterior and posterior images were obtained  approximately 3 hours after intravenous injection of radiopharmaceutical. RADIOPHARMACEUTICALS:  21.7 mCi Technetium-36mMDP IV COMPARISON:  CT 10/12/2020.  Bone scan 08/11/2019. FINDINGS: Prior left nephrectomy. Right renal function and excretion. Minimal increased activity noted at the costovertebral junction on the right at T9 is most likely degenerative. Similar finding noted on prior study of 08/11/2019. No definite T9 vertebral body abnormal activity corresponding to lytic lesion noted on recent CT. Again the recently identified T9 vertebral body lytic lesion is suspicious and further evaluation of the thoracic spine with MRI can be obtained. No other bony abnormalities noted by bone scan. IMPRESSION: 1. Minimal increased activity noted at the right costovertebral junction at the T9 level is most likely degenerative. Similar finding noted on prior bone scan of 08/11/2019. No definite T9 vertebral body abnormal activity corresponding to lytic lesion noted on recent CT. Again the recently identified T9 vertebral body lytic lesion is suspicious and further evaluation of the thoracic spine with MRI can be obtained. No other bony abnormalities noted by bone scan. 2.  Prior left nephrectomy. Electronically Signed   By: TMarcello Moores Register   On: 10/27/2020 10:27   CT CHEST ABDOMEN PELVIS WO CONTRAST  Result Date: 10/12/2020 CLINICAL DATA:  RCC follow-up, status post left nephrectomy, on immunotherapy EXAM: CT CHEST, ABDOMEN AND PELVIS WITHOUT CONTRAST TECHNIQUE: Multidetector CT imaging of the chest, abdomen and pelvis was performed following the standard protocol without IV contrast. COMPARISON:  07/16/2020 FINDINGS: CT CHEST FINDINGS Cardiovascular: Heart is normal in size.  No pericardial effusion. No evidence of thoracic aortic aneurysm. Atherosclerotic calcifications of the aortic arch. Three vessel coronary atherosclerosis. Right chest port terminates at the cavoatrial junction. Mediastinum/Nodes: No  suspicious mediastinal lymphadenopathy. Visualized thyroid is unremarkable. Lungs/Pleura: Faint subpleural ground-glass opacities/reticulation in the lungs bilaterally (for example, series 4/image 43), improved from the prior, favoring post infectious/inflammatory scarring related to prior atypical infection such as COVID. No suspicious pulmonary nodules. Specifically, the prior right perihilar nodule/lymph node has resolved. No focal consolidation. No pleural effusion or pneumothorax. Musculoskeletal: Cervical spine fixation hardware, incompletely visualized. Degenerative changes of the thoracic spine. 15 mm lytic lesion in the right T9 vertebral body (series 2/image 41), new, suspicious. CT ABDOMEN PELVIS FINDINGS Hepatobiliary: Cirrhosis.  No focal hepatic lesion is seen. Suspected tiny layering gallstone (series 2/image 67), without associated inflammatory changes. No intrahepatic or extrahepatic duct dilatation. Pancreas: Within normal limits. Spleen: Enlarged, measuring 20.3 cm in maximal craniocaudal dimension. Adrenals/Urinary Tract: Adrenal glands are within normal limits. Status post left nephrectomy. No abnormal soft tissue in the surgical bed. Right kidney is within normal limits. No renal  calculi or hydronephrosis. Bladder is within normal limits. Stomach/Bowel: Stomach is within normal limits. No evidence of bowel obstruction. Normal appendix (series 2/image 96). No colonic wall thickening or inflammatory changes. Vascular/Lymphatic: Side aneurysm Atherosclerotic calcifications of the abdominal aorta and branch vessels. No suspicious abdominopelvic lymphadenopathy. Reproductive: Prostate is unremarkable. Penile prosthesis. Other: Minimal perihepatic ascites. Mild mesenteric stranding with trace left pelvic ascites (series 2/image 111). Musculoskeletal: Mild degenerative changes of the lumbar spine. Tiny lytic lesion along the inferior aspect of the L3 vertebral body (series 6/image 149), unchanged.  IMPRESSION: Status post left nephrectomy. New 15 mm lytic lesion in the right T9 vertebral body, suspicious for isolated lytic metastasis. No findings suspicious for soft tissue metastases in the chest, abdomen, or pelvis. Residual post infectious inflammatory scarring in the lungs bilaterally. Cirrhosis.  Splenomegaly.  Minimal abdominopelvic ascites. Additional ancillary findings as above. Electronically Signed   By: Julian Hy M.D.   On: 10/12/2020 12:27      ASSESSMENT & PLAN:  No diagnosis found.  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 Proceed with nivolumab today.  Bone metastasis T9 vertebral body Status post SBRT with Dr. Donella Stade No significant symptom improvement yet. Chronic back pain, slightly worse, probably due to chronic degenerative disease.   Continue Voltaren cream.  Monitor symptoms. 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 Today Plt count is 58.  Monitor.  Chronic kidney disease,  stable level Baseline Creatinine 1.7-1.9 Today's creatinine is 1.77.  Encourage oral hydration and avoid nephrotoxin.  Anemia  Chronic hemoglobin is 9.5  stable  Diabetes He is off metformin.  Glucose is 115.  Stable  Thyroid dysfunction D/t immunotherapy  TSH is 3.636  Disposition: RTC as scheduled on 01/21/2021 for next cycle of Opdivo, MD assessment and labs.  Greater than 50% was spent in counseling and coordination of care with this patient including but not limited to discussion of the relevant topics above (See A&P) including, but not limited to diagnosis and management of acute and chronic medical conditions.   Faythe Casa, NP 01/07/2021 8:24 AM

## 2021-01-09 ENCOUNTER — Encounter: Payer: Self-pay | Admitting: Oncology

## 2021-01-10 ENCOUNTER — Other Ambulatory Visit: Payer: Self-pay

## 2021-01-10 ENCOUNTER — Ambulatory Visit: Payer: Medicare Other | Admitting: Dermatology

## 2021-01-10 DIAGNOSIS — L720 Epidermal cyst: Secondary | ICD-10-CM

## 2021-01-10 DIAGNOSIS — L0231 Cutaneous abscess of buttock: Secondary | ICD-10-CM | POA: Diagnosis not present

## 2021-01-10 DIAGNOSIS — L03317 Cellulitis of buttock: Secondary | ICD-10-CM

## 2021-01-10 MED ORDER — CIPROFLOXACIN HCL 250 MG PO TABS: 250.0000 mg | ORAL_TABLET | Freq: Two times a day (BID) | ORAL | 0 refills | Status: AC

## 2021-01-10 NOTE — Progress Notes (Signed)
   Follow-Up Visit   Subjective  Henry Moore is a 60 y.o. male who presents for the following: Cyst (Patient here today for a painful cyst at buttocks. Started on Friday, has not drained. ). The patient is on immunotherapy chemo for renal cell cancer  The following portions of the chart were reviewed this encounter and updated as appropriate:   Tobacco  Allergies  Meds  Problems  Med Hx  Surg Hx  Fam Hx      Review of Systems:  No other skin or systemic complaints except as noted in HPI or Assessment and Plan.  Objective  Well appearing patient in no apparent distress; mood and affect are within normal limits.  A focused examination was performed including buttocks. Relevant physical exam findings are noted in the Assessment and Plan.  Left perianal/rectal Subcutaneous nodule.    Assessment & Plan  Epidermal inclusion cyst /abscess of anal area versus more likely fistulous abscess possibly communicating with anus Left perianal/rectal  Start cipro 250 mg BID x 7 days.   Incision and Drainage - Left anal/rectal Location: left anal/rectal  Informed Consent: Discussed risks (permanent scarring, light or dark discoloration, infection, pain, bleeding, bruising, redness, damage to adjacent structures, and recurrence of the lesion) and benefits of the procedure, as well as the alternatives.  Informed consent was obtained.  Preparation: The area was prepped with alcohol.  Anesthesia: Lidocaine 1% with epinephrine.   Procedure Details: An incision was made overlying the lesion. The lesion drained clear, mucoid fluid and blood and pus and clots and odorous drainage. A large amount of fluid was drained.    Antibiotic ointment and a sterile pressure dressing were applied. The patient tolerated procedure well.  Total number of lesions drained: 1  Plan: The patient was instructed on post-op care. Recommend OTC analgesia as needed for pain.  **He may need to see GI or general  surgery for possible anal fistula communication with GI tract.  Related Procedures Anaerobic and Aerobic Culture  Follow up as needed.  Documentation: I have reviewed the above documentation for accuracy and completeness, and I agree with the above.  Sarina Ser, MD

## 2021-01-11 ENCOUNTER — Encounter: Payer: Self-pay | Admitting: Dermatology

## 2021-01-12 ENCOUNTER — Encounter: Payer: Self-pay | Admitting: Oncology

## 2021-01-12 ENCOUNTER — Ambulatory Visit: Payer: BC Managed Care – PPO

## 2021-01-14 ENCOUNTER — Encounter: Payer: Self-pay | Admitting: Oncology

## 2021-01-16 LAB — ANAEROBIC AND AEROBIC CULTURE

## 2021-01-17 ENCOUNTER — Encounter: Payer: Self-pay | Admitting: Oncology

## 2021-01-18 ENCOUNTER — Telehealth: Payer: Self-pay

## 2021-01-18 ENCOUNTER — Encounter: Payer: Self-pay | Admitting: Oncology

## 2021-01-18 NOTE — Telephone Encounter (Signed)
Patient advised of information. Patient states he has two days of antibiotics left. Area is healing but not completely healed at this time. Patient advised to call back if no improvement.

## 2021-01-18 NOTE — Telephone Encounter (Signed)
-----   Message from Ralene Bathe, MD sent at 01/17/2021  5:46 PM EDT ----- Culture from abscess 01/10/21 showed mixed anaerobic and aerobic organisms. Continue cleaning area and finish all antibiotics

## 2021-01-18 NOTE — Telephone Encounter (Signed)
Left message on voicemail to return my call.  

## 2021-01-19 ENCOUNTER — Ambulatory Visit
Admission: RE | Admit: 2021-01-19 | Discharge: 2021-01-19 | Disposition: A | Discharge status: Home / Self Care | Payer: Medicare Other | Source: Ambulatory Visit | Attending: Radiation Oncology | Admitting: Radiation Oncology

## 2021-01-19 ENCOUNTER — Encounter: Payer: Self-pay | Admitting: Radiation Oncology

## 2021-01-19 VITALS — BP 121/67 | HR 76 | Temp 97.8°F | Resp 16 | Wt 248.9 lb

## 2021-01-19 DIAGNOSIS — C642 Malignant neoplasm of left kidney, except renal pelvis: Secondary | ICD-10-CM | POA: Insufficient documentation

## 2021-01-19 DIAGNOSIS — Z923 Personal history of irradiation: Secondary | ICD-10-CM | POA: Insufficient documentation

## 2021-01-19 DIAGNOSIS — M4800 Spinal stenosis, site unspecified: Secondary | ICD-10-CM | POA: Insufficient documentation

## 2021-01-19 DIAGNOSIS — C7951 Secondary malignant neoplasm of bone: Secondary | ICD-10-CM | POA: Diagnosis not present

## 2021-01-19 DIAGNOSIS — C7800 Secondary malignant neoplasm of unspecified lung: Secondary | ICD-10-CM | POA: Diagnosis not present

## 2021-01-19 NOTE — Progress Notes (Signed)
Radiation Oncology Follow up Note  Name: Henry Moore   Date:   01/19/2021 MRN:  [id number] DOB: April 29, 1961    This 60 y.o. male presents to the clinic today for 1 month follow-up status post SBRT to T9 for metastatic renal cell carcinoma.  REFERRING PROVIDER: Albina Billet, MD  HPI: Patient is a 60 year old male now at 1 month having completed SBRT to his T9 vertebral body for.  Metastatic renal cell carcinoma seen today in routine follow-up he states his pain is not that much resolved.  He does have a history of long-term standing of back pain with spinal stenosis.  He is currently on immunotherapy withnivolumab which he is tolerating well.  COMPLICATIONS OF TREATMENT: none  FOLLOW UP COMPLIANCE: keeps appointments   PHYSICAL EXAM:  BP 121/67   Pulse 76   Temp 97.8 F (36.6 C) (Tympanic)   Resp 16   Wt 248 lb 14.4 oz (112.9 kg)   BMI 32.84 kg/m  Deep palpation of the spine does not elicit pain.  Motor or sensory and DTR levels are equal and symmetric in the upper lower extremities.  Well-developed well-nourished patient in NAD. HEENT reveals PERLA, EOMI, discs not visualized.  Oral cavity is clear. No oral mucosal lesions are identified. Neck is clear without evidence of cervical or supraclavicular adenopathy. Lungs are clear to A&P. Cardiac examination is essentially unremarkable with regular rate and rhythm without murmur rub or thrill. Abdomen is benign with no organomegaly or masses noted. Motor sensory and DTR levels are equal and symmetric in the upper and lower extremities. Cranial nerves II through XII are grossly intact. Proprioception is intact. No peripheral adenopathy or edema is identified. No motor or sensory levels are noted. Crude visual fields are within normal range.  RADIOLOGY RESULTS: No current films for review  PLAN: Present time patient is doing well of assured him the area of destruction of T9 is resolved at this point although with his history of back pain it  may take much longer for the pain to resolve.  He continues close follow-up care and treatment with medical oncology.  I am turning follow-up care over to medical oncology I be happy to reevaluate the patient at any time should further radiation therapy for palliation be indicated.  I would like to take this opportunity to thank you for allowing me to participate in the care of your patient.Noreene Filbert, MD

## 2021-01-20 ENCOUNTER — Encounter: Payer: Self-pay | Admitting: Oncology

## 2021-01-21 ENCOUNTER — Encounter: Payer: Self-pay | Admitting: Oncology

## 2021-01-21 ENCOUNTER — Inpatient Hospital Stay (HOSPITAL_BASED_OUTPATIENT_CLINIC_OR_DEPARTMENT_OTHER): Payer: Medicare Other | Admitting: Oncology

## 2021-01-21 ENCOUNTER — Inpatient Hospital Stay: Payer: Medicare Other

## 2021-01-21 VITALS — BP 128/61 | HR 74 | Temp 98.6°F | Resp 18 | Wt 250.7 lb

## 2021-01-21 DIAGNOSIS — C649 Malignant neoplasm of unspecified kidney, except renal pelvis: Secondary | ICD-10-CM

## 2021-01-21 DIAGNOSIS — Z95828 Presence of other vascular implants and grafts: Secondary | ICD-10-CM | POA: Diagnosis not present

## 2021-01-21 DIAGNOSIS — D631 Anemia in chronic kidney disease: Secondary | ICD-10-CM

## 2021-01-21 DIAGNOSIS — D696 Thrombocytopenia, unspecified: Secondary | ICD-10-CM

## 2021-01-21 DIAGNOSIS — Z5112 Encounter for antineoplastic immunotherapy: Secondary | ICD-10-CM | POA: Diagnosis not present

## 2021-01-21 DIAGNOSIS — C642 Malignant neoplasm of left kidney, except renal pelvis: Secondary | ICD-10-CM

## 2021-01-21 DIAGNOSIS — M899 Disorder of bone, unspecified: Secondary | ICD-10-CM | POA: Diagnosis not present

## 2021-01-21 DIAGNOSIS — N1832 Chronic kidney disease, stage 3b: Secondary | ICD-10-CM

## 2021-01-21 DIAGNOSIS — D708 Other neutropenia: Secondary | ICD-10-CM

## 2021-01-21 LAB — CBC WITH DIFFERENTIAL/PLATELET
Abs Immature Granulocytes: 0.02 10*3/uL (ref 0.00–0.07)
Basophils Absolute: 0 10*3/uL (ref 0.0–0.1)
Basophils Relative: 2 %
Eosinophils Absolute: 0.1 10*3/uL (ref 0.0–0.5)
Eosinophils Relative: 4 %
HCT: 26.9 % — ABNORMAL LOW (ref 39.0–52.0)
Hemoglobin: 9 g/dL — ABNORMAL LOW (ref 13.0–17.0)
Immature Granulocytes: 1 %
Lymphocytes Relative: 15 %
Lymphs Abs: 0.3 10*3/uL — ABNORMAL LOW (ref 0.7–4.0)
MCH: 31.1 pg (ref 26.0–34.0)
MCHC: 33.5 g/dL (ref 30.0–36.0)
MCV: 93.1 fL (ref 80.0–100.0)
Monocytes Absolute: 0.2 10*3/uL (ref 0.1–1.0)
Monocytes Relative: 12 %
Neutro Abs: 1.3 10*3/uL — ABNORMAL LOW (ref 1.7–7.7)
Neutrophils Relative %: 66 %
Platelets: 61 10*3/uL — ABNORMAL LOW (ref 150–400)
RBC: 2.89 MIL/uL — ABNORMAL LOW (ref 4.22–5.81)
RDW: 13.9 % (ref 11.5–15.5)
WBC: 2 10*3/uL — ABNORMAL LOW (ref 4.0–10.5)
nRBC: 0 % (ref 0.0–0.2)

## 2021-01-21 LAB — COMPREHENSIVE METABOLIC PANEL
ALT: 24 U/L (ref 0–44)
AST: 36 U/L (ref 15–41)
Albumin: 3.6 g/dL (ref 3.5–5.0)
Alkaline Phosphatase: 109 U/L (ref 38–126)
Anion gap: 6 (ref 5–15)
BUN: 24 mg/dL — ABNORMAL HIGH (ref 6–20)
CO2: 22 mmol/L (ref 22–32)
Calcium: 8.8 mg/dL — ABNORMAL LOW (ref 8.9–10.3)
Chloride: 108 mmol/L (ref 98–111)
Creatinine, Ser: 1.87 mg/dL — ABNORMAL HIGH (ref 0.61–1.24)
GFR, Estimated: 41 mL/min — ABNORMAL LOW (ref >60)
Glucose, Bld: 148 mg/dL — ABNORMAL HIGH (ref 70–99)
Potassium: 4.8 mmol/L (ref 3.5–5.1)
Sodium: 136 mmol/L (ref 135–145)
Total Bilirubin: 0.7 mg/dL (ref 0.3–1.2)
Total Protein: 7.3 g/dL (ref 6.5–8.1)

## 2021-01-21 MED ORDER — HEPARIN SOD (PORK) LOCK FLUSH 100 UNIT/ML IV SOLN
500.0000 [IU] | Freq: Once | INTRAVENOUS | Status: AC | PRN
  Filled 2021-01-21: qty 5

## 2021-01-21 MED ORDER — HEPARIN SOD (PORK) LOCK FLUSH 100 UNIT/ML IV SOLN
INTRAVENOUS | Status: AC
  Filled 2021-01-21: qty 5

## 2021-01-21 MED ORDER — SODIUM CHLORIDE 0.9 % IV SOLN
240.0000 mg | Freq: Once | INTRAVENOUS | Status: AC
  Administered 2021-01-21: 240 mg via INTRAVENOUS
  Filled 2021-01-21: qty 24

## 2021-01-21 MED ORDER — SODIUM CHLORIDE 0.9 % IV SOLN
Freq: Once | INTRAVENOUS | Status: AC
Start: 2021-01-21 — End: 2021-01-21
  Filled 2021-01-21: qty 250

## 2021-01-21 MED ORDER — SODIUM CHLORIDE 0.9% FLUSH
10.0000 mL | INTRAVENOUS | Status: DC | PRN
  Administered 2021-01-21: 10 mL via INTRAVENOUS
  Filled 2021-01-21: qty 10

## 2021-01-21 MED ORDER — HEPARIN SOD (PORK) LOCK FLUSH 100 UNIT/ML IV SOLN
500.0000 [IU] | Freq: Once | INTRAVENOUS | Status: AC
  Administered 2021-01-21: 500 [IU] via INTRAVENOUS
  Filled 2021-01-21: qty 5

## 2021-01-21 NOTE — Progress Notes (Signed)
Hematology/Oncology follow-up [facility] [phone] [phone]   Patient Care Team: Albina Billet, MD as PCP - General (Internal Medicine) Earlie Server, MD as Consulting Physician (Hematology and Oncology)  REFERRING PROVIDER: Albina Billet, MD  CHIEF COMPLAINTS/REASON FOR VISIT:  Follow-up for kidney cancer treatments  HISTORY OF PRESENTING ILLNESS:   Henry Moore is a  60 y.o.  male with PMH listed below was seen in consultation at the request of  Albina Billet, MD  for evaluation of kidney cancer Extensive medical records were reviewed Patient had MRI lumbar spine without contrast done on 07/13/2018 which showed mired lumbar degenerative disease. CT thoracic spine was done on 08/05/2018 which showed 5 cm left renal mass. 09/04/2018 CT abdomen and pelvis with and without contrast showed 5 cm round enhancing solid mass in the left kidney.  No involvement of left renal vein.  No lymphadenopathy Morphologic changes consistent with cirrhosis and the portal hypertension.  Small volume ascites and splenomegaly. . 10/16/2018 Left nephrectomy showed renal cell carcinoma, clear-cell type, nuclear grade 4, tumor extends into the renal vein and renal sinus fat.  Urethral, vascular and or resection margins are negative for tumor pT3a Nx Mx  01/28/2019 patient had another CT chest abdomen pelvis done with contrast Showed interval left nephrectomy.  Scattered tiny pulmonary nodules bilaterally.  Nonspecific.  No other evidence of metastatic disease.  Cirrhosis changes extensive coronary and aortic atherosclerosis  07/14/2019 patient underwent surveillance CT chest abdomen pelvis without contrast Interval progression of pulmonary metastasis with new enlarged right paratracheal lymph node 1.7 cm, previously 0.6 cm one-point since worrisome for metastatic adenopathy.  Multiple progressive pulmonary nodules, index nodule within the lingula measures 1.3 cm, previously  3 mm. Index subpleural nodule within the anterior right upper lobe measuring 1.8 cm, previously 3 cm Index nodule within the post lateral right lower lobe measuring 5 mm, new from previous care Aortic sclerosis.  Three-vessel coronary artery calcification noted.  Cirrhosis changes.  Splenomegaly.  Patient was referred to establish care with medical oncology for further discussion and evaluation of metastatic renal cell carcinoma. Patient reports feeling well at baseline today.  Denies any shortness of breath, cough, hemoptysis, back pain, headache. He quitted smoking 2015.  Not currently actively drinking alcohol as well. Patient has chronic back pain unchanged.  #08/14/2019-10/16/2019 nivolumab and ipilimumab. #11/06/2019-nivolumab every 2 weeks maintenance.  # Liver cirrhosis with small amount of ascites/splenomegaly patient sees gastroenterology Dr. [name].  . He will get ultrasound surveillance due in February 2021 for screening of [name].  EGD surveillance for Barrett's plan in April 2021.  # Diabetes, on Humalog sliding scale and metformin.  #Family history of cancer and personal history of RCC.  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  Status post ipilimumab and nivolumab treatment.  # 10/29/2019 CT chest abdomen pelvis without contrast images were independently reviewed by me and discussed with patient.   CT showed marked improvement with resolution of many of the pulmonary nodules, prominent reduced size of other nodules.  Considerably reduced size of the right lower paratracheal lymph node now 0.9 cm in diameter. Hepatic cirrhosis with prominent splenomegaly and trace perisplenic ascites. Chronic CT findings includes aortic atherosclerotic vascular  disease.  Mild sigmoid colon diverticulosis  # CKD, he  establish care with Dr. Candiss Norse for chronic kidney disease.  Blood pressure medication has been adjusted.  Hydrochlorothiazide decreased to 12.5 mg daily.  # NGS -foundation one PD-L1 TPS 0% no reportable mutation, MS stable. TMB 8 muts/mb VHL [id number]*16, SETD2 BAP Genetic testing is negative.   # 07/16/2020, CT chest abdomen pelvis images were reviewed and discussed with patient Compared to July 2021 image, he has had a new patchy groundglass opacities throughout both lungs with central part of solids/airspace components.  Findings are nonspecific and could be secondary to an atypical infection, including viral pneumonia or inflammation.  Patient is asymptomatic.  I wonder the changes are secondary to his Covid infection.  Patient has been started on Levaquin to cover atypical infection by on-call physician and I recommend him to continue and complete the course. Proceed with today's immunotherapy nivolumab.  Advised patient to notify me if his symptoms get worse.  # 10/12/2020 CT chest abdomen pelvis with out contrast. New 15 mm lytic lesion in the right T9 vertebral body.  Suspicious for isolated lytic metastasis.  No findings of suspicious soft tissue metastasis in the chest abdomen or pelvis.  Residual postinfectious inflammatory scarring in the lung bilaterally.  Cirrhosis.  Splenomegaly.  Minimal ascites.  # 10/26/20 Bone scan showed Minimal increased activity noted at the right costovertebral junction at the T9 level is most likely degenerative. Similar finding noted on prior bone scan of 08/11/2019. No definite T9 vertebral body abnormal activity corresponding to lytic lesion noted on recent CT. Again the recently identified T9 vertebral body lytic lesion is suspicious and further evaluation of the thoracic spine with MRI can be obtained. No other bony abnormalities noted by bone scan. I discussed patient's case on tumor board 11/04/2020.  Bone  scan probably is not sensitive in detecting lytic lesion from [place].  Consensus reached upon clinically based on the CT scan, new bone lesion is highly likely metastasis from [address].  Options including proceed with bone lesion biopsy versus referral to radiation oncology for empiric palliative radiation.  Patient has other comorbidities including cirrhosis/pancytopenia, patient opts to proceed with empiric palliative radiation.  12/13/2020 finished RT to T9 INTERVAL HISTORY Henry Moore is a 60 y.o. male who has above history reviewed by me today presents for follow up visit for management of stage IV RCC  Problems and complaints are listed below: During the interval he developed abscess of anal area , s/ drainage and antibiotics. Symptoms have improved. He was not able to do CT due to this.  Chronic back pain, he feels slightly worse.   Review of Systems  Constitutional:  Positive for fatigue. Negative for appetite change, chills, fever and unexpected weight change.  HENT:   Negative for hearing loss and voice change.   Eyes:  Negative for eye problems and icterus.  Respiratory:  Negative for chest tightness, cough and shortness of breath.   Cardiovascular:  Negative for chest pain and leg swelling.  Gastrointestinal:  Negative for abdominal distention and abdominal pain.  Endocrine: Negative for hot flashes.  Genitourinary:  Negative for difficulty urinating, dysuria and frequency.   Musculoskeletal:  Positive for arthralgias.       Chronic back pain  Skin:  Negative for itching and rash.       Hair loss  Neurological:  Negative for light-headedness and numbness.  Hematological:  Negative for adenopathy. Does not bruise/bleed easily.  Psychiatric/Behavioral:  Negative for confusion.    MEDICAL HISTORY:  Past Medical History:  Diagnosis Date  Cough    SINUS DRAINAGE CAUSING COUGHING , REPORTS CLEAR MUCOUS    Diabetes mellitus without complication ([place])    Family history of breast cancer     Family history of colon cancer    Family history of stomach cancer    HTN (hypertension)    Hyperlipemia    Left renal mass    Platelets decreased ([place])    DENIES UNSUAL BLEEDING    PONV (postoperative nausea and vomiting)    Renal cell carcinoma ([place]) 08/04/2019   WBC decreased     SURGICAL HISTORY: Past Surgical History:  Procedure Laterality Date   ANKLE SURGERY Left    ANTERIOR CERVICAL DECOMP/DISCECTOMY FUSION N/A 04/16/2014   Procedure: ANTERIOR CERVICAL DECOMPRESSION/DISCECTOMY FUSION  (ACDF C5-C7)   (2 LEVELS)     ;  Surgeon: Melina Schools, MD;  Location: [facility];  Service: Orthopedics;  Laterality: N/A;   APPENDECTOMY     BACK SURGERY     ESOPHAGOGASTRODUODENOSCOPY (EGD) WITH PROPOFOL N/A 02/06/2019   Procedure: ESOPHAGOGASTRODUODENOSCOPY (EGD) WITH PROPOFOL;  Surgeon: [name], MD;  Location: [facility] END[facility]COPY;  Service: Gastroenterology;  Laterality: N/A;   ESOPHAGOGASTRODUODENOSCOPY (EGD) WITH PROPOFOL N/A 03/04/2019   Procedure: ESOPHAGOGASTRODUODENOSCOPY (EGD) WITH PROPOFOL;  Surgeon: Lin Landsman, MD;  Location: [facility];  Service: Gastroenterology;  Laterality: N/A;   ESOPHAGOGASTRODUODENOSCOPY (EGD) WITH PROPOFOL N/A 04/22/2019   Procedure: ESOPHAGOGASTRODUODENOSCOPY (EGD) WITH PROPOFOL;  Surgeon: [name], MD;  Location: [facility] ENDOSCOPY;  Service: Gastroenterology;  Laterality: N/A;   FRACTURE SURGERY     HYDROCELE EXCISION     KNEE ARTHROSCOPY Bilateral    LAPAROSCOPIC NEPHRECTOMY, HAND ASSISTED Left 10/16/2018   Procedure: LEFT HAND ASSISTED LAPAROSCOPIC NEPHRECTOMY;  Surgeon: Lucas Mallow, MD;  Location: [facility];  Service: Urology;  Laterality: Left;   NASAL SINUS SURGERY     X 2   PENILE PROSTHESIS IMPLANT  10 YEARS AGO   PORTA CATH INSERTION N/A 11/11/2019   Procedure: PORTA CATH INSERTION;  Surgeon: Katha Cabal, MD;  Location: [facility];  Service: Cardiovascular;  Laterality: N/A;   SHOULDER SURGERY Left     SOCIAL  HISTORY: Social History   Socioeconomic History   Marital status: Married    Spouse name: Not on file   Number of children: Not on file   Years of education: Not on file   Highest education level: Not on file  Occupational History   Not on file  Tobacco Use   Smoking status: Former    Packs/day: 0.25    Years: 20.00    Pack years: 5.00    Types: Cigarettes    Quit date: 2015    Years since quitting: 7.4   Smokeless tobacco: Never  Vaping Use   Vaping Use: Never used  Substance and Sexual Activity   Alcohol use: Not Currently    Alcohol/week: 0.0 standard drinks    Comment: no alchol in 1 year   Drug use: No   Sexual activity: Not on file  Other Topics Concern   Not on file  Social History Narrative   Not on file   Social Determinants of Health   Financial Resource Strain: Not on file  Food Insecurity: Not on file  Transportation Needs: Not on file  Physical Activity: Not on file  Stress: Not on file  Social Connections: Not on file  Intimate Partner Violence: Not on file    FAMILY HISTORY: Family History  Problem Relation Age of Onset   Diabetes Mother  Cancer Mother        neuroendocrine cancer   Cancer Father        neuroendocrine cancer of meninges   Cancer Maternal Grandmother        tomach dx 45   Alzheimer's disease Paternal Grandmother    Lung cancer Paternal Grandfather    Lung cancer Maternal Aunt    Colon cancer Maternal Uncle    Breast cancer Paternal Aunt        both dx 62s   Ovarian cancer Other        dx 79   Breast cancer Cousin     ALLERGIES:  is allergic to codeine and mucinex [guaifenesin er].  MEDICATIONS:  Current Outpatient Medications  Medication Sig Dispense Refill   ACCU-CHEK AVIVA PLUS test strip CHECK BL00D SUGAR ONCE OR TWICE DAILY. 100 each PRN   calcium carbonate (OS-CAL - DOSED IN MG OF ELEMENTAL CALCIUM) 1250 (500 Ca) MG tablet Take 1 tablet by mouth.     carvedilol (COREG) 25 MG tablet Take 1 tablet (25 mg  total) by mouth 2 (two) times daily with a meal. 180 tablet 3   Cholecalciferol (VITAMIN D3) 125 MCG (5000 UT) CAPS Take 5,000 Units by mouth daily.     diclofenac Sodium (VOLTAREN) 1 % GEL Apply 2 g topically 4 (four) times daily. 100 g 2   glipiZIDE (GLUCOTROL) 5 MG tablet Take 1 tablet (5 mg total) by mouth daily before breakfast. 90 tablet 3   losartan (COZAAR) 100 MG tablet Take 1 tablet by mouth daily. 90 tablet 0   lovastatin (MEVACOR) 40 MG tablet Take 1 tablet (40 mg total) by mouth at bedtime. 60 tablet 0   metFORMIN (GLUCOPHAGE-XR) 500 MG 24 hr tablet Take 500 mg by mouth 2 (two) times daily. 2 QAM, 2 QHS     prochlorperazine (COMPAZINE) 10 MG tablet Take 1 tablet (10 mg total) by mouth every 6 (six) hours as needed for nausea or vomiting. 30 tablet 0   vitamin B-12 (CYANOCOBALAMIN) 1000 MCG tablet Take 1 tablet (1,000 mcg total) by mouth daily. 90 tablet 0   amLODipine (NORVASC) 10 MG tablet Take 10 mg by mouth daily. (Patient not taking: Reported on 01/21/2021)     hydrochlorothiazide (HYDRODIURIL) 25 MG tablet TAKE 1 TABLET DAILY. (Patient not taking: Reported on 01/21/2021) 90 tablet 0   insulin aspart (NOVOLOG) 100 UNIT/ML injection Inject into the skin. Inject under the skin 3 (three) times a day before meals Sliding scale, Daughters insulin, using while sugar is very out of wack from cancer treatments, temporary (Patient not taking: Reported on 01/21/2021)     insulin detemir (LEVEMIR) 100 UNIT/ML injection Inject into the skin. Inject 15 units under the skin every night at night, temporary, daughter's insulin (Patient not taking: Reported on 01/21/2021)     insulin lispro (INSULIN LISPRO) 100 UNIT/ML KwikPen Junior Inject into the skin 3 (three) times daily as needed (Above 250). Sliding scale (Patient not taking: Reported on 01/21/2021)     ofloxacin (OCUFLOX) 0.3 % ophthalmic solution Dry eye (Patient not taking: Reported on 01/21/2021)     No current facility-administered medications  for this visit.   Facility-Administered Medications Ordered in Other Visits  Medication Dose Route Frequency Provider Last Rate Last Admin   heparin lock flush 100 unit/mL  500 Units Intravenous Once Rickard Patience, MD       sodium chloride flush (NS) 0.9 % injection 10 mL  10 mL Intravenous PRN Rickard Patience, MD  10 mL at 01/22/20 0831   sodium chloride flush (NS) 0.9 % injection 10 mL  10 mL Intravenous PRN Earlie Server, MD   10 mL at 01/21/21 0805     PHYSICAL EXAMINATION: ECOG PERFORMANCE STATUS: 1 - Symptomatic but completely ambulatory Vitals:   01/21/21 0838  BP: 128/61  Pulse: 74  Resp: 18  Temp: 98.6 F (37 C)   Filed Weights   01/21/21 0838  Weight: 250 lb 11.2 oz (113.7 kg)    Physical Exam Constitutional:      General: He is not in acute distress.    Appearance: He is obese.  HENT:     Head: Normocephalic and atraumatic.  Eyes:     General: No scleral icterus. Cardiovascular:     Rate and Rhythm: Normal rate and regular rhythm.     Heart sounds: Normal heart sounds.  Pulmonary:     Effort: Pulmonary effort is normal. No respiratory distress.     Breath sounds: No wheezing.  Abdominal:     General: Bowel sounds are normal. There is no distension.     Palpations: Abdomen is soft.  Musculoskeletal:        General: No deformity. Normal range of motion.     Cervical back: Normal range of motion and neck supple.  Skin:    General: Skin is warm and dry.     Findings: No erythema or rash.  Neurological:     Mental Status: He is alert and oriented to person, place, and time. Mental status is at baseline.     Cranial Nerves: No cranial nerve deficit.     Coordination: Coordination normal.  Psychiatric:        Mood and Affect: Mood normal.    LABORATORY DATA:  I have reviewed the data as listed Lab Results  Component Value Date   WBC 2.0 (L) 01/21/2021   HGB 9.0 (L) 01/21/2021   HCT 26.9 (L) 01/21/2021   MCV 93.1 01/21/2021   PLT 61 (L) 01/21/2021   Recent Labs     04/01/20 0837 04/15/20 0853 04/29/20 0849 05/21/20 1253 12/23/20 0808 01/07/21 0801 01/21/21 0802  NA 139 135 138   < > 131* 137 136  K 4.3 4.3 4.2   < > 4.9 4.4 4.8  CL 107 104 108   < > 102 107 108  CO2 21* 21* 23   < > 23 22 PENDING  GLUCOSE 123* 162* 109*   < > 120* 115* 148*  BUN 24* 22* 20   < > 33* 25* 24*  CREATININE 1.69* 1.87* 1.58*   < > 1.84* 1.77* 1.87*  CALCIUM 8.5* 8.6* 8.7*   < > 9.5 9.3 8.8*  GFRNONAA 44* 39* 48*   < > 42* 44* 41*  GFRAA 51* 45* 55*  --   --   --   --   PROT 7.9 7.8 8.0   < > 7.7 7.8 7.3  ALBUMIN 4.0 4.0 4.3   < > 4.0 3.8 3.6  AST 31 34 29   < > 31 32 36  ALT $Re'24 27 21   'BMc$ < > $R'23 25 24  'RT$ ALKPHOS 102 117 108   < > 115 109 109  BILITOT 0.9 0.8 1.5*   < > 1.0 1.2 0.7   < > = values in this interval not displayed.    Iron/TIBC/Ferritin/ %Sat    Component Value Date/Time   IRON 60 09/02/2020 0822   IRON 105 10/15/2018 1325  TIBC 293 09/02/2020 0822   TIBC 244 (L) 10/15/2018 1325   FERRITIN 164 09/02/2020 0822   FERRITIN 588 (H) 10/15/2018 1325   IRONPCTSAT 21 09/02/2020 0822   IRONPCTSAT 43 10/15/2018 1325      RADIOGRAPHIC STUDIES: I have personally reviewed the radiological images as listed and agreed with the findings in the report. NM Bone Scan Whole Body  Result Date: 10/27/2020 CLINICAL DATA:  Thoracic spine lytic lesion. History of left nephrectomy. EXAM: NUCLEAR MEDICINE WHOLE BODY BONE SCAN TECHNIQUE: Whole body anterior and posterior images were obtained approximately 3 hours after intravenous injection of radiopharmaceutical. RADIOPHARMACEUTICALS:  21.7 mCi Technetium-20m MDP IV COMPARISON:  CT 10/12/2020.  Bone scan 08/11/2019. FINDINGS: Prior left nephrectomy. Right renal function and excretion. Minimal increased activity noted at the costovertebral junction on the right at T9 is most likely degenerative. Similar finding noted on prior study of 08/11/2019. No definite T9 vertebral body abnormal activity corresponding to lytic lesion  noted on recent CT. Again the recently identified T9 vertebral body lytic lesion is suspicious and further evaluation of the thoracic spine with MRI can be obtained. No other bony abnormalities noted by bone scan. IMPRESSION: 1. Minimal increased activity noted at the right costovertebral junction at the T9 level is most likely degenerative. Similar finding noted on prior bone scan of 08/11/2019. No definite T9 vertebral body abnormal activity corresponding to lytic lesion noted on recent CT. Again the recently identified T9 vertebral body lytic lesion is suspicious and further evaluation of the thoracic spine with MRI can be obtained. No other bony abnormalities noted by bone scan. 2.  Prior left nephrectomy. Electronically Signed   By: Marcello Moores  Register   On: 10/27/2020 10:27      ASSESSMENT & PLAN:  1. Renal cell carcinoma of left kidney (HCC)   2. Encounter for antineoplastic immunotherapy   3. Port-A-Cath in place   4. Bone lesion   5. Thrombocytopenia ([place])   6. Other neutropenia ([place])   7. 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 Proceed with Nivolumab today.  Re-schedule CT chest abdomen pelvis.   #T9 lesion, status post palliative radiation.  No significant symptom improvement yet. Chronic back pain, slightly worse, probably due to chronic degenerative disease.  Advised patient to apply Voltaren cream to the area.  Monitor symptoms.  #Chronic thrombocytopenia and neutropenia,  due to liver cirrhosis His counts is overall stable.  Monitor.  #Chronic kidney disease, stable level Encourage oral hydration and avoid nephrotoxin.  #Anemia, chronic, hemoglobin is 9.0, stable. monitor  #Diabetes,  He is off metformin.  Glucose is 148  Follow-up in 2 weeks lab MD+ nivolumab.  All questions were answered. The patient knows to call the clinic with any problems questions or  concerns.   Earlie Server, MD, PhD Hematology Oncology [facility] Pager- [phone] 01/21/2021

## 2021-01-21 NOTE — Progress Notes (Signed)
Patient here for follow up. Pt was not able to get CT scan done due other medical issues and would like scan r/s.

## 2021-01-27 ENCOUNTER — Encounter: Payer: Self-pay | Admitting: Oncology

## 2021-02-01 ENCOUNTER — Telehealth: Payer: Self-pay

## 2021-02-01 ENCOUNTER — Other Ambulatory Visit: Payer: Self-pay

## 2021-02-01 ENCOUNTER — Emergency Department: Payer: Medicare Other

## 2021-02-01 ENCOUNTER — Emergency Department
Admission: EM | Admit: 2021-02-01 | Discharge: 2021-02-01 | Disposition: A | Discharge status: Home / Self Care | Payer: Medicare Other | Attending: Emergency Medicine | Admitting: Emergency Medicine

## 2021-02-01 DIAGNOSIS — R1084 Generalized abdominal pain: Secondary | ICD-10-CM | POA: Diagnosis present

## 2021-02-01 DIAGNOSIS — Z7984 Long term (current) use of oral hypoglycemic drugs: Secondary | ICD-10-CM | POA: Diagnosis not present

## 2021-02-01 DIAGNOSIS — Z87891 Personal history of nicotine dependence: Secondary | ICD-10-CM | POA: Insufficient documentation

## 2021-02-01 DIAGNOSIS — Z79899 Other long term (current) drug therapy: Secondary | ICD-10-CM | POA: Insufficient documentation

## 2021-02-01 DIAGNOSIS — E1140 Type 2 diabetes mellitus with diabetic neuropathy, unspecified: Secondary | ICD-10-CM | POA: Insufficient documentation

## 2021-02-01 DIAGNOSIS — Z85528 Personal history of other malignant neoplasm of kidney: Secondary | ICD-10-CM | POA: Insufficient documentation

## 2021-02-01 DIAGNOSIS — I10 Essential (primary) hypertension: Secondary | ICD-10-CM | POA: Diagnosis not present

## 2021-02-01 DIAGNOSIS — Z794 Long term (current) use of insulin: Secondary | ICD-10-CM | POA: Diagnosis not present

## 2021-02-01 DIAGNOSIS — E119 Type 2 diabetes mellitus without complications: Secondary | ICD-10-CM

## 2021-02-01 LAB — COMPREHENSIVE METABOLIC PANEL
ALT: 18 U/L (ref 0–44)
AST: 32 U/L (ref 15–41)
Albumin: 3.9 g/dL (ref 3.5–5.0)
Alkaline Phosphatase: 96 U/L (ref 38–126)
Anion gap: 6 (ref 5–15)
BUN: 24 mg/dL — ABNORMAL HIGH (ref 6–20)
CO2: 24 mmol/L (ref 22–32)
Calcium: 9.5 mg/dL (ref 8.9–10.3)
Chloride: 104 mmol/L (ref 98–111)
Creatinine, Ser: 1.77 mg/dL — ABNORMAL HIGH (ref 0.61–1.24)
GFR, Estimated: 44 mL/min — ABNORMAL LOW (ref >60)
Glucose, Bld: 135 mg/dL — ABNORMAL HIGH (ref 70–99)
Potassium: 4.6 mmol/L (ref 3.5–5.1)
Sodium: 134 mmol/L — ABNORMAL LOW (ref 135–145)
Total Bilirubin: 1 mg/dL (ref 0.3–1.2)
Total Protein: 8 g/dL (ref 6.5–8.1)

## 2021-02-01 LAB — CBC
HCT: 30.3 % — ABNORMAL LOW (ref 39.0–52.0)
Hemoglobin: 10.3 g/dL — ABNORMAL LOW (ref 13.0–17.0)
MCH: 31.6 pg (ref 26.0–34.0)
MCHC: 34 g/dL (ref 30.0–36.0)
MCV: 92.9 fL (ref 80.0–100.0)
Platelets: 77 10*3/uL — ABNORMAL LOW (ref 150–400)
RBC: 3.26 MIL/uL — ABNORMAL LOW (ref 4.22–5.81)
RDW: 13.5 % (ref 11.5–15.5)
WBC: 2.4 10*3/uL — ABNORMAL LOW (ref 4.0–10.5)
nRBC: 0 % (ref 0.0–0.2)

## 2021-02-01 LAB — LIPASE, BLOOD: Lipase: 172 U/L — ABNORMAL HIGH (ref 11–51)

## 2021-02-01 MED ORDER — IOHEXOL 300 MG/ML  SOLN
100.0000 mL | Freq: Once | INTRAMUSCULAR | Status: DC | PRN
  Filled 2021-02-01: qty 100

## 2021-02-01 MED ORDER — SODIUM CHLORIDE 0.9 % IV BOLUS
1000.0000 mL | Freq: Once | INTRAVENOUS | Status: AC
  Administered 2021-02-01: 1000 mL via INTRAVENOUS

## 2021-02-01 NOTE — ED Provider Notes (Signed)
[facility] Emergency Department Provider Note  ____________________________________________  Time seen: Approximately 6:02 PM  I have reviewed the triage vital signs and the nursing notes.   HISTORY  Chief Complaint Abdominal Pain    HPI HARRY SHUCK is a 60 y.o. male with a history of diabetes hypertension and renal cell carcinoma status post left nephrectomy who complains of central abdominal pain and pressure that is been ongoing for the past week.  Currently feeling a little bit improved but is been constant, waxing and waning without aggravating or alleviating factors during this time.  No vomiting but he has had frequent clear watery diarrhea.  No dysuria.  No fever.  Feels like he is getting dehydrated but not lightheaded.  Reports that he is also been on immunotherapy for greater than a year.  Also recently he was found to have some metastasis in his lower back and had radiation therapy a month ago.  No chest pain or shortness of breath.    Past Medical History:  Diagnosis Date   Cough    SINUS DRAINAGE CAUSING COUGHING , REPORTS CLEAR MUCOUS    Diabetes mellitus without complication ([place])    Family history of breast cancer    Family history of colon cancer    Family history of stomach cancer    HTN (hypertension)    Hyperlipemia    Left renal mass    Platelets decreased ([place])    DENIES UNSUAL BLEEDING    PONV (postoperative nausea and vomiting)    Renal cell carcinoma ([place]) 08/04/2019   WBC decreased      Patient Active Problem List   Diagnosis Date Noted   Normocytic anemia 02/19/2020   Other neutropenia ([place]) 02/19/2020   Encounter for antineoplastic immunotherapy 01/01/2020   Port-A-Cath in place 11/21/2019   Genetic testing 10/07/2019   Family history of breast cancer    Family history of colon cancer    Family history of stomach cancer    Thrombocytopenia ([place]) 08/21/2019   Goals of care, counseling/discussion 08/04/2019    Renal cell carcinoma ([place]) 08/04/2019   Chronic kidney disease, stage 3b ([place]) 06/30/2019   History of radical nephrectomy 06/30/2019   Esophageal varices without bleeding ([place]) 06/30/2019   Secondary esophageal varices without bleeding ([place])    Other cirrhosis of liver ([place])    Cellulitis of great toe of right foot 12/31/2018   Cellulitis of toe 12/31/2018   Renal mass, left 10/16/2018   DDD (degenerative disc disease), lumbar 03/12/2017   Degeneration of lumbar intervertebral disc 03/12/2017   Hyperlipemia 05/13/2015   Type 2 diabetes, controlled, with neuropathy ([place]) 05/13/2015   Essential hypertension, benign 05/13/2015   Benign essential hypertension 05/13/2015   Hyperlipidemia 05/13/2015   Type 2 diabetes mellitus ([place]) 05/13/2015   S/P cervical spinal fusion 04/16/2014   Arthrodesis status 04/16/2014     Past Surgical History:  Procedure Laterality Date   ANKLE SURGERY Left    ANTERIOR CERVICAL DECOMP/DISCECTOMY FUSION N/A 04/16/2014   Procedure: ANTERIOR CERVICAL DECOMPRESSION/DISCECTOMY FUSION  (ACDF C5-C7)   (2 LEVELS)     ;  Surgeon: Melina Schools, MD;  Location: [place];  Service: Orthopedics;  Laterality: N/A;   APPENDECTOMY     BACK SURGERY     ESOPHAGOGASTRODUODENOSCOPY (EGD) WITH PROPOFOL N/A 02/06/2019   Procedure: ESOPHAGOGASTRODUODENOSCOPY (EGD) WITH PROPOFOL;  Surgeon: [name], MD;  Location: [facility] ENDOSCOPY;  Service: Gastroenterology;  Laterality: N/A;   ESOPHAGOGASTRODUODENOSCOPY (EGD) WITH PROPOFOL N/A 03/04/2019   Procedure: ESOPHAGOGASTRODUODENOSCOPY (  EGD) WITH PROPOFOL;  Surgeon: Lin Landsman, MD;  Location: [facility] ENDOSCOPY;  Service: Gastroenterology;  Laterality: N/A;   ESOPHAGOGASTRODUODENOSCOPY (EGD) WITH PROPOFOL N/A 04/22/2019   Procedure: ESOPHAGOGASTRODUODENOSCOPY (EGD) WITH PROPOFOL;  Surgeon: [name], MD;  Location: [facility] ENDOSCOPY;  Service: Gastroenterology;  Laterality: N/A;   FRACTURE SURGERY     HYDROCELE EXCISION     KNEE  ARTHROSCOPY Bilateral    LAPAROSCOPIC NEPHRECTOMY, HAND ASSISTED Left 10/16/2018   Procedure: LEFT HAND ASSISTED LAPAROSCOPIC NEPHRECTOMY;  Surgeon: Lucas Mallow, MD;  Location: [facility] ORS;  Service: Urology;  Laterality: Left;   NASAL SINUS SURGERY     X 2   PENILE PROSTHESIS IMPLANT  10 YEARS AGO   PORTA CATH INSERTION N/A 11/11/2019   Procedure: PORTA CATH INSERTION;  Surgeon: Katha Cabal, MD;  Location: [place] CV LAB;  Service: Cardiovascular;  Laterality: N/A;   SHOULDER SURGERY Left      Prior to Admission medications   Medication Sig Start Date End Date Taking? Authorizing Provider  ACCU-CHEK AVIVA PLUS test strip CHECK BL00D SUGAR ONCE OR TWICE DAILY. 12/14/16   Johnson, Megan P, DO  amLODipine (NORVASC) 10 MG tablet Take 10 mg by mouth daily. Patient not taking: Reported on 01/21/2021 06/18/18   [provider]  calcium carbonate (OS-CAL - DOSED IN MG OF ELEMENTAL CALCIUM) 1250 (500 Ca) MG tablet Take 1 tablet by mouth.    [provider]  carvedilol (COREG) 25 MG tablet Take 1 tablet (25 mg total) by mouth 2 (two) times daily with a meal. 06/18/18   Crissman, Jeannette How, MD  Cholecalciferol (VITAMIN D3) 125 MCG (5000 UT) CAPS Take 5,000 Units by mouth daily.    [provider]  diclofenac Sodium (VOLTAREN) 1 % GEL Apply 2 g topically 4 (four) times daily. 09/02/20   Earlie Server, MD  glipiZIDE (GLUCOTROL) 5 MG tablet Take 1 tablet (5 mg total) by mouth daily before breakfast. 06/18/18   Guadalupe Maple, MD  hydrochlorothiazide (HYDRODIURIL) 25 MG tablet TAKE 1 TABLET DAILY. Patient not taking: Reported on 01/21/2021 07/22/18   Volney American, PA-C  insulin aspart (NOVOLOG) 100 UNIT/ML injection Inject into the skin. Inject under the skin 3 (three) times a day before meals Sliding scale, Daughters insulin, using while sugar is very out of wack from cancer treatments, temporary Patient not taking: Reported on 01/21/2021    [provider]   insulin detemir (LEVEMIR) 100 UNIT/ML injection Inject into the skin. Inject 15 units under the skin every night at night, temporary, daughter's insulin Patient not taking: Reported on 01/21/2021    [provider]  insulin lispro (INSULIN LISPRO) 100 UNIT/ML KwikPen Junior Inject into the skin 3 (three) times daily as needed (Above 250). Sliding scale Patient not taking: Reported on 01/21/2021    [provider]  losartan (COZAAR) 100 MG tablet Take 1 tablet by mouth daily. 07/22/18   Volney American, PA-C  lovastatin (MEVACOR) 40 MG tablet Take 1 tablet (40 mg total) by mouth at bedtime. 05/04/19   Guadalupe Maple, MD  metFORMIN (GLUCOPHAGE-XR) 500 MG 24 hr tablet Take 500 mg by mouth 2 (two) times daily. 2 QAM, 2 QHS 04/02/19   [provider]  ofloxacin (OCUFLOX) 0.3 % ophthalmic solution Dry eye Patient not taking: Reported on 01/21/2021 10/05/20   [provider]  prochlorperazine (COMPAZINE) 10 MG tablet Take 1 tablet (10 mg total) by mouth every 6 (six) hours as needed for nausea or  vomiting. 08/07/19   Jacquelin Hawking, NP  vitamin B-12 (CYANOCOBALAMIN) 1000 MCG tablet Take 1 tablet (1,000 mcg total) by mouth daily. 01/01/20   Earlie Server, MD     Allergies Codeine and Mucinex [guaifenesin er]   Family History  Problem Relation Age of Onset   Diabetes Mother    Cancer Mother        neuroendocrine cancer   Cancer Father        neuroendocrine cancer of meninges   Cancer Maternal Grandmother        tomach dx 11   Alzheimer's disease Paternal Grandmother    Lung cancer Paternal Grandfather    Lung cancer Maternal Aunt    Colon cancer Maternal Uncle    Breast cancer Paternal Aunt        both dx 65s   Ovarian cancer Other        dx 53   Breast cancer Cousin     Social History Social History   Tobacco Use   Smoking status: Former    Packs/day: 0.25    Years: 20.00    Pack years: 5.00    Types: Cigarettes    Quit date: 2015    Years  since quitting: 7.5   Smokeless tobacco: Never  Vaping Use   Vaping Use: Never used  Substance Use Topics   Alcohol use: Not Currently    Alcohol/week: 0.0 standard drinks    Comment: no alchol in 1 year   Drug use: No    Review of Systems  Constitutional:   No fever or chills.  ENT:   No sore throat. No rhinorrhea. Cardiovascular:   No chest pain or syncope. Respiratory:   No dyspnea or cough. Gastrointestinal:   Positive as above for abdominal pain and diarrhea.  Musculoskeletal:   Negative for focal pain or swelling All other systems reviewed and are negative except as documented above in ROS and HPI.  ____________________________________________   PHYSICAL EXAM:  VITAL SIGNS: ED Triage Vitals [02/01/21 1625]  Enc Vitals Group     BP (!) 159/81     Pulse Rate 78     Resp 18     Temp 99.1 F (37.3 C)     Temp Source Oral     SpO2 98 %     Weight 250 lb 11.2 oz (113.7 kg)     Height 6\' 1"  (1.854 m)     Head Circumference      Peak Flow      Pain Score 6     Pain Loc      Pain Edu?      Excl. in [place]?     Vital signs reviewed, nursing assessments reviewed.   Constitutional:   Alert and oriented. Non-toxic appearance. Eyes:   Conjunctivae are normal. EOMI. PERRL. ENT      Head:   Normocephalic and atraumatic.      Nose:   Wearing a mask.      Mouth/Throat:   Wearing a mask.      Neck:   No meningismus. Full ROM. Hematological/Lymphatic/Immunilogical:   No cervical lymphadenopathy. Cardiovascular:   RRR. Symmetric bilateral radial and DP pulses.  No murmurs. Cap refill less than 2 seconds. Respiratory:   Normal respiratory effort without tachypnea/retractions. Breath sounds are clear and equal bilaterally. No wheezes/rales/rhonchi. Gastrointestinal:   Soft with mild central abdominal tenderness. Non distended. There is no CVA tenderness.  No rebound, rigidity, or guarding. Genitourinary:   deferred Musculoskeletal:  Normal range of motion in all extremities.  No joint effusions.  No lower extremity tenderness.  No edema. Neurologic:   Normal speech and language.  Motor grossly intact. No acute focal neurologic deficits are appreciated.  Skin:    Skin is warm, dry and intact. No rash noted.  No petechiae, purpura, or bullae.  ____________________________________________    LABS (pertinent positives/negatives) (all labs ordered are listed, but only abnormal results are displayed) Labs Reviewed  LIPASE, BLOOD - Abnormal; Notable for the following components:      Result Value   Lipase 172 (*)    All other components within normal limits  COMPREHENSIVE METABOLIC PANEL - Abnormal; Notable for the following components:   Sodium 134 (*)    Glucose, Bld 135 (*)    BUN 24 (*)    Creatinine, Ser 1.77 (*)    GFR, Estimated 44 (*)    All other components within normal limits  CBC - Abnormal; Notable for the following components:   WBC 2.4 (*)    RBC 3.26 (*)    Hemoglobin 10.3 (*)    HCT 30.3 (*)    Platelets 77 (*)    All other components within normal limits  URINALYSIS, COMPLETE (UACMP) WITH MICROSCOPIC   ____________________________________________   EKG    ____________________________________________    RADIOLOGY  CT ABDOMEN PELVIS WO CONTRAST  Result Date: 02/01/2021 CLINICAL DATA:  Abdominal abscess/infection suspected Abdominal pain, acute, nonlocalized Patient reports abdominal pain and pressure with diarrhea. Immunotherapy 624 for renal cell cancer. EXAM: CT ABDOMEN AND PELVIS WITHOUT CONTRAST TECHNIQUE: Multidetector CT imaging of the abdomen and pelvis was performed following the standard protocol without IV contrast. COMPARISON:  Most recent CT 10/12/2020 FINDINGS: Lower chest: No acute airspace disease or pleural effusion. There are coronary artery calcifications. Hepatobiliary: Cirrhotic hepatic morphology with nodular contours. No evidence of focal hepatic lesion on this unenhanced exam. Distended gallbladder with  layering gallstones. No biliary dilatation. Pancreas: No ductal dilatation or inflammation. Spleen: Splenomegaly with spleen spanning 20 cm cranial caudal. No focal splenic abnormality. Adrenals/Urinary Tract: Normal adrenal glands. Left nephrectomy without abnormal soft tissue density in the nephrectomy bed. No right hydronephrosis or renal calculi. Mild right perinephric edema. No focal renal abnormality. Partially distended urinary bladder. Stomach/Bowel: The stomach is decompressed. Infraumbilical ventral abdominal wall hernia contains short segment of small bowel, however no wall thickening or proximal dilatation. No obstruction. There are occasional fluid-filled loops of small bowel in the lower pelvis. Normal appendix. Mild wall thickening of the ascending colon may represent portal colopathy or colitis. There is liquid stool in the descending and sigmoid colon. No bowel pneumatosis. Vascular/Lymphatic: Advanced aortic and branch atherosclerosis, particularly for age. No portal venous or mesenteric gas. Venous prominence at the portal splenic confluence is stable from prior exam, and not well assessed on this unenhanced exam. Scattered upper abdominal and periportal lymph nodes, not enlarged by size criteria. Reproductive: Prostate is unremarkable.  Penile prosthesis in place. Other: Small volume abdominopelvic ascites, with slight increase from prior exam. Similar generalized mesenteric stranding. Infraumbilical ventral abdominal wall hernia just to the left of midline, series 2, image 66, contains short segment of small bowel. Musculoskeletal: Lytic lesion within T9 vertebral body has increased in size from prior exam. Tiny lytic lesion within inferior L3 vertebral bodies unchanged. No definite new osseous abnormality. IMPRESSION: 1. Mild wall thickening of the ascending colon may represent portal colopathy or colitis. Liquid stool in the descending and sigmoid colon, can be seen with diarrheal  illness. 2.  Small infraumbilical ventral abdominal wall hernia contains short segment of small bowel, however no wall thickening or proximal dilatation/obstruction. 3. Cirrhosis and splenomegaly. Small volume abdominopelvic ascites, with slight increase from prior exam. 4. Post left nephrectomy. 5. Lytic lesion within T9 vertebral body has minimally increased in size from prior exam. Tiny lytic lesion within inferior L3 vertebral body unchanged. 6. Advanced aortic and branch atherosclerosis. Aortic Atherosclerosis (ICD10-I70.0). Electronically Signed   By: Keith Rake M.D.   On: 02/01/2021 18:29    ____________________________________________   PROCEDURES Procedures  ____________________________________________  DIFFERENTIAL DIAGNOSIS   Bowel obstruction, tumor, pancreatitis, lymphoma, dehydration, viral syndrome, C. difficile, ascites  CLINICAL IMPRESSION / ASSESSMENT AND PLAN / ED COURSE  Medications ordered in the ED: Medications  sodium chloride 0.9 % bolus 1,000 mL (0 mLs Intravenous Stopped 02/01/21 1937)    Pertinent labs & imaging results that were available during my care of the patient were reviewed by me and considered in my medical decision making (see chart for details).  Male Minish Seeling was evaluated in Emergency Department on 02/01/2021 for the symptoms described in the history of present illness. He was evaluated in the context of the global COVID-19 pandemic, which necessitated consideration that the patient might be at risk for infection with the SARS-CoV-2 virus that causes COVID-19. Institutional protocols and algorithms that pertain to the evaluation of patients at risk for COVID-19 are in a state of rapid change based on information released by regulatory bodies including the CDC and federal and state organizations. These policies and algorithms were followed during the patient's care in the ED.   Patient presents with central abdominal pain which is overall mild currently.  Exam is  benign with some tenderness.  Doubt mesenteric ischemia, dissection, aneurysm.  Not consistent with SBP.  Due to his history, will obtain CT scan of the abdomen and pelvis, give IV fluids for hydration.  Declines pain medicine for now.  Clinical Course as of 02/01/21 1944  Tue Feb 01, 2021  1808 Will need to do CT without contrast due to low GFR, solitary kidney. [PS]    Clinical Course User Index [PS] Carrie Mew, MD   ----------------------------------------- 7:44 PM on 02/01/2021 ----------------------------------------- CT reassuring. Labs okay. Tolerating PO. No sig. Colitis, wbc stable, doubt c dif. Will DC. Has f/u with onc this week.    ____________________________________________   FINAL CLINICAL IMPRESSION(S) / ED DIAGNOSES    Final diagnoses:  Generalized abdominal pain  Type 2 diabetes mellitus without complication, without long-term current use of insulin [facility])     ED Discharge Orders     None       Portions of this note were generated with dragon dictation software. Dictation errors may occur despite best attempts at proofreading.    Carrie Mew, MD 02/01/21 1945

## 2021-02-01 NOTE — ED Triage Notes (Signed)
Pt to ER via POV with complaints of centralized abdominal pain/ pressure. Pt reports constant diarrhea since 01/23/21. Pt reports having immunotherapy 6/24 for renal cell cancer. Pt reports never having this side effect before and has been receiving this medication for 1.5 years.   Denies fevers/ urinary symptoms. Reports decrease in appetite.

## 2021-02-01 NOTE — Telephone Encounter (Signed)
Patient called triage line with concerns of severe diarrhea and abdominal pain.  Patient reports since his last treatment he was having diarrhea every hour and 20-30 minutes after eating.  The diarrhea consist of clear watery stool.  He has had 4 episodes of the diarrhea today.  Has tried Imodium for the diarrhea but it has not helped relive the diarrhea.  Also been having low abdominal pain that he describes as feeling like there is "gas trapped" all the time.  The pain seems to be more severe at night, 7/10 on pain scale.  The abdominal pain today is feeling like pressure of needing to have a bowel movement.    Reports that he is drinking water, Gatorade, and he has a powder that he mixes to help prevent dehydration but feels like he may be dehydrated due to the diarrhea.  Discussed symptoms with Dr. Tasia Catchings and she advises patient go to ED.  Patient was notified of MD recommendation and he agrees with proceeding to ED.

## 2021-02-01 NOTE — ED Notes (Signed)
Pt drinking ginger ale  

## 2021-02-03 ENCOUNTER — Encounter: Payer: Self-pay | Admitting: Oncology

## 2021-02-03 ENCOUNTER — Ambulatory Visit
Admission: RE | Admit: 2021-02-03 | Discharge: 2021-02-03 | Disposition: A | Discharge status: Home / Self Care | Payer: Medicare Other | Source: Ambulatory Visit | Attending: Oncology | Admitting: Oncology

## 2021-02-03 ENCOUNTER — Other Ambulatory Visit: Payer: Self-pay

## 2021-02-03 DIAGNOSIS — C642 Malignant neoplasm of left kidney, except renal pelvis: Secondary | ICD-10-CM | POA: Insufficient documentation

## 2021-02-04 ENCOUNTER — Inpatient Hospital Stay: Payer: Medicare Other

## 2021-02-04 ENCOUNTER — Inpatient Hospital Stay: Payer: Medicare Other | Attending: Hospice and Palliative Medicine | Admitting: Hospice and Palliative Medicine

## 2021-02-04 ENCOUNTER — Encounter: Payer: Self-pay | Admitting: Oncology

## 2021-02-04 ENCOUNTER — Inpatient Hospital Stay (HOSPITAL_BASED_OUTPATIENT_CLINIC_OR_DEPARTMENT_OTHER): Payer: Medicare Other | Admitting: Oncology

## 2021-02-04 VITALS — BP 118/52 | HR 75 | Temp 96.6°F | Resp 18 | Wt 241.0 lb

## 2021-02-04 DIAGNOSIS — E1122 Type 2 diabetes mellitus with diabetic chronic kidney disease: Secondary | ICD-10-CM | POA: Insufficient documentation

## 2021-02-04 DIAGNOSIS — Z905 Acquired absence of kidney: Secondary | ICD-10-CM | POA: Insufficient documentation

## 2021-02-04 DIAGNOSIS — K8020 Calculus of gallbladder without cholecystitis without obstruction: Secondary | ICD-10-CM | POA: Diagnosis not present

## 2021-02-04 DIAGNOSIS — R161 Splenomegaly, not elsewhere classified: Secondary | ICD-10-CM | POA: Diagnosis not present

## 2021-02-04 DIAGNOSIS — R197 Diarrhea, unspecified: Secondary | ICD-10-CM | POA: Diagnosis not present

## 2021-02-04 DIAGNOSIS — Z9049 Acquired absence of other specified parts of digestive tract: Secondary | ICD-10-CM | POA: Diagnosis not present

## 2021-02-04 DIAGNOSIS — Z515 Encounter for palliative care: Secondary | ICD-10-CM

## 2021-02-04 DIAGNOSIS — Z885 Allergy status to narcotic agent status: Secondary | ICD-10-CM | POA: Diagnosis not present

## 2021-02-04 DIAGNOSIS — C642 Malignant neoplasm of left kidney, except renal pelvis: Secondary | ICD-10-CM | POA: Insufficient documentation

## 2021-02-04 DIAGNOSIS — D696 Thrombocytopenia, unspecified: Secondary | ICD-10-CM

## 2021-02-04 DIAGNOSIS — D708 Other neutropenia: Secondary | ICD-10-CM

## 2021-02-04 DIAGNOSIS — N1832 Chronic kidney disease, stage 3b: Secondary | ICD-10-CM | POA: Diagnosis not present

## 2021-02-04 DIAGNOSIS — Z923 Personal history of irradiation: Secondary | ICD-10-CM | POA: Diagnosis not present

## 2021-02-04 DIAGNOSIS — I700 Atherosclerosis of aorta: Secondary | ICD-10-CM | POA: Diagnosis not present

## 2021-02-04 DIAGNOSIS — K439 Ventral hernia without obstruction or gangrene: Secondary | ICD-10-CM | POA: Diagnosis not present

## 2021-02-04 DIAGNOSIS — R140 Abdominal distension (gaseous): Secondary | ICD-10-CM | POA: Diagnosis not present

## 2021-02-04 DIAGNOSIS — R918 Other nonspecific abnormal finding of lung field: Secondary | ICD-10-CM | POA: Diagnosis not present

## 2021-02-04 DIAGNOSIS — K7460 Unspecified cirrhosis of liver: Secondary | ICD-10-CM | POA: Diagnosis not present

## 2021-02-04 DIAGNOSIS — Z79899 Other long term (current) drug therapy: Secondary | ICD-10-CM | POA: Insufficient documentation

## 2021-02-04 DIAGNOSIS — R188 Other ascites: Secondary | ICD-10-CM | POA: Insufficient documentation

## 2021-02-04 DIAGNOSIS — D631 Anemia in chronic kidney disease: Secondary | ICD-10-CM

## 2021-02-04 DIAGNOSIS — R600 Localized edema: Secondary | ICD-10-CM | POA: Insufficient documentation

## 2021-02-04 LAB — COMPREHENSIVE METABOLIC PANEL
ALT: 20 U/L (ref 0–44)
AST: 36 U/L (ref 15–41)
Albumin: 3.8 g/dL (ref 3.5–5.0)
Alkaline Phosphatase: 109 U/L (ref 38–126)
Anion gap: 9 (ref 5–15)
BUN: 31 mg/dL — ABNORMAL HIGH (ref 6–20)
CO2: 23 mmol/L (ref 22–32)
Calcium: 8.9 mg/dL (ref 8.9–10.3)
Chloride: 103 mmol/L (ref 98–111)
Creatinine, Ser: 2.26 mg/dL — ABNORMAL HIGH (ref 0.61–1.24)
GFR, Estimated: 33 mL/min — ABNORMAL LOW (ref >60)
Glucose, Bld: 123 mg/dL — ABNORMAL HIGH (ref 70–99)
Potassium: 4.1 mmol/L (ref 3.5–5.1)
Sodium: 135 mmol/L (ref 135–145)
Total Bilirubin: 1 mg/dL (ref 0.3–1.2)
Total Protein: 7.7 g/dL (ref 6.5–8.1)

## 2021-02-04 LAB — CBC WITH DIFFERENTIAL/PLATELET
Abs Immature Granulocytes: 0.02 10*3/uL (ref 0.00–0.07)
Basophils Absolute: 0 10*3/uL (ref 0.0–0.1)
Basophils Relative: 1 %
Eosinophils Absolute: 0.1 10*3/uL (ref 0.0–0.5)
Eosinophils Relative: 6 %
HCT: 28.9 % — ABNORMAL LOW (ref 39.0–52.0)
Hemoglobin: 9.7 g/dL — ABNORMAL LOW (ref 13.0–17.0)
Immature Granulocytes: 1 %
Lymphocytes Relative: 12 %
Lymphs Abs: 0.2 10*3/uL — ABNORMAL LOW (ref 0.7–4.0)
MCH: 31.2 pg (ref 26.0–34.0)
MCHC: 33.6 g/dL (ref 30.0–36.0)
MCV: 92.9 fL (ref 80.0–100.0)
Monocytes Absolute: 0.3 10*3/uL (ref 0.1–1.0)
Monocytes Relative: 15 %
Neutro Abs: 1.3 10*3/uL — ABNORMAL LOW (ref 1.7–7.7)
Neutrophils Relative %: 65 %
Platelets: 65 10*3/uL — ABNORMAL LOW (ref 150–400)
RBC: 3.11 MIL/uL — ABNORMAL LOW (ref 4.22–5.81)
RDW: 13.5 % (ref 11.5–15.5)
WBC: 2.1 10*3/uL — ABNORMAL LOW (ref 4.0–10.5)
nRBC: 0 % (ref 0.0–0.2)

## 2021-02-04 LAB — TSH: TSH: 3.706 u[IU]/mL (ref 0.350–4.500)

## 2021-02-04 MED ORDER — SODIUM CHLORIDE 0.9 % IV SOLN
Freq: Once | INTRAVENOUS | Status: AC
Start: 2021-02-04 — End: 2021-02-04
  Filled 2021-02-04: qty 250

## 2021-02-04 MED ORDER — HEPARIN SOD (PORK) LOCK FLUSH 100 UNIT/ML IV SOLN
500.0000 [IU] | Freq: Once | INTRAVENOUS | Status: AC
  Administered 2021-02-04: 500 [IU] via INTRAVENOUS
  Filled 2021-02-04: qty 5

## 2021-02-04 MED ORDER — PREDNISONE 20 MG PO TABS: 40.0000 mg | ORAL_TABLET | Freq: Every day | ORAL | 0 refills | Status: DC

## 2021-02-04 MED ORDER — SODIUM CHLORIDE 0.9% FLUSH
10.0000 mL | INTRAVENOUS | Status: DC | PRN
  Administered 2021-02-04: 10 mL via INTRAVENOUS
  Filled 2021-02-04: qty 10

## 2021-02-04 NOTE — Progress Notes (Signed)
Voicemail left for patient.  Will reschedule.

## 2021-02-04 NOTE — Progress Notes (Signed)
Pt in for follow up, reports has had watery diarrhea since last visit 2 weeks ago.  Reports referred ED on Tuesday of this week and they would not access port a cath and IV was compromised and he did not get but a quarter of the bag.  Reports taking 6 imodium just to leave home and still has loose stool. Pt has had 9 lb weight loss since last visit.

## 2021-02-04 NOTE — Progress Notes (Signed)
Hematology/Oncology follow-up [facility] [phone] [phone]   Patient Care Team: Albina Billet, MD as PCP - General (Internal Medicine) Earlie Server, MD as Consulting Physician (Hematology and Oncology)  REFERRING PROVIDER: Albina Billet, MD  CHIEF COMPLAINTS/REASON FOR VISIT:  Follow-up for kidney cancer treatments  HISTORY OF PRESENTING ILLNESS:   Henry Moore is a  60 y.o.  male with PMH listed below was seen in consultation at the request of  Albina Billet, MD  for evaluation of kidney cancer Extensive medical records were reviewed Patient had MRI lumbar spine without contrast done on 07/13/2018 which showed mired lumbar degenerative disease. CT thoracic spine was done on 08/05/2018 which showed 5 cm left renal mass. 09/04/2018 CT abdomen and pelvis with and without contrast showed 5 cm round enhancing solid mass in the left kidney.  No involvement of left renal vein.  No lymphadenopathy Morphologic changes consistent with cirrhosis and the portal hypertension.  Small volume ascites and splenomegaly. . 10/16/2018 Left nephrectomy showed renal cell carcinoma, clear-cell type, nuclear grade 4, tumor extends into the renal vein and renal sinus fat.  Urethral, vascular and or resection margins are negative for tumor pT3a Nx Mx  01/28/2019 patient had another CT chest abdomen pelvis done with contrast Showed interval left nephrectomy.  Scattered tiny pulmonary nodules bilaterally.  Nonspecific.  No other evidence of metastatic disease.  Cirrhosis changes extensive coronary and aortic atherosclerosis  07/14/2019 patient underwent surveillance CT chest abdomen pelvis without contrast Interval progression of pulmonary metastasis with new enlarged right paratracheal lymph node 1.7 cm, previously 0.6 cm one-point since worrisome for metastatic adenopathy.  Multiple progressive pulmonary nodules, index nodule within the lingula measures 1.3 cm, previously  3 mm. Index subpleural nodule within the anterior right upper lobe measuring 1.8 cm, previously 3 cm Index nodule within the post lateral right lower lobe measuring 5 mm, new from previous care Aortic sclerosis.  Three-vessel coronary artery calcification noted.  Cirrhosis changes.  Splenomegaly.  Patient was referred to establish care with medical oncology for further discussion and evaluation of metastatic renal cell carcinoma. Patient reports feeling well at baseline today.  Denies any shortness of breath, cough, hemoptysis, back pain, headache. He quitted smoking 2015.  Not currently actively drinking alcohol as well. Patient has chronic back pain unchanged.  #08/14/2019-10/16/2019 nivolumab and ipilimumab. #11/06/2019-nivolumab every 2 weeks maintenance.  # Liver cirrhosis with small amount of ascites/splenomegaly patient sees gastroenterology Dr. [name].  . He will get ultrasound surveillance due in February 2021 for screening of [name].  EGD surveillance for Barrett's plan in April 2021.  # Diabetes, on Humalog sliding scale and metformin.  #Family history of cancer and personal history of RCC.  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  Status post ipilimumab and nivolumab treatment.  # 10/29/2019 CT chest abdomen pelvis without contrast images were independently reviewed by me and discussed with patient.   CT showed marked improvement with resolution of many of the pulmonary nodules, prominent reduced size of other nodules.  Considerably reduced size of the right lower paratracheal lymph node now 0.9 cm in diameter. Hepatic cirrhosis with prominent splenomegaly and trace perisplenic ascites. Chronic CT findings includes aortic atherosclerotic vascular  disease.  Mild sigmoid colon diverticulosis  # CKD, he  establish care with Dr. Candiss Norse for chronic kidney disease.  Blood pressure medication has been adjusted.  Hydrochlorothiazide decreased to 12.5 mg daily.  # NGS -foundation one PD-L1 TPS 0% no reportable mutation, MS stable. TMB 8 muts/mb VHL D170f*16, SETD2 BAP Genetic testing is negative.   # 07/16/2020, CT chest abdomen pelvis images were reviewed and discussed with patient Compared to July 2021 image, he has had a new patchy groundglass opacities throughout both lungs with central part of solids/airspace components.  Findings are nonspecific and could be secondary to an atypical infection, including viral pneumonia or inflammation.  Patient is asymptomatic.  I wonder the changes are secondary to his Covid infection.  Patient has been started on Levaquin to cover atypical infection by on-call physician and I recommend him to continue and complete the course. Proceed with today's immunotherapy nivolumab.  Advised patient to notify me if his symptoms get worse.  # 10/12/2020 CT chest abdomen pelvis with out contrast. New 15 mm lytic lesion in the right T9 vertebral body.  Suspicious for isolated lytic metastasis.  No findings of suspicious soft tissue metastasis in the chest abdomen or pelvis.  Residual postinfectious inflammatory scarring in the lung bilaterally.  Cirrhosis.  Splenomegaly.  Minimal ascites.  # 10/26/20 Bone scan showed Minimal increased activity noted at the right costovertebral junction at the T9 level is most likely degenerative. Similar finding noted on prior bone scan of 08/11/2019. No definite T9 vertebral body abnormal activity corresponding to lytic lesion noted on recent CT. Again the recently identified T9 vertebral body lytic lesion is suspicious and further evaluation of the thoracic spine with MRI can be obtained. No other bony abnormalities noted by bone scan. I discussed patient's case on tumor board 11/04/2020.  Bone  scan probably is not sensitive in detecting lytic lesion from [place]  Consensus reached upon clinically based on the CT scan, new bone lesion is highly likely metastasis from [place]  Options including proceed with bone lesion biopsy versus referral to radiation oncology for empiric palliative radiation.  Patient has other comorbidities including cirrhosis/pancytopenia, patient opts to proceed with empiric palliative radiation.  12/13/2020 finished RT to T9 June 2022 abscess of anal area , s/ drainage and antibiotics.  INTERVAL HISTORY SGEDEON BRANDOWis a 60y.o. male who has above history reviewed by me today presents for follow up visit for management of stage IV RCC  Problems and complaints are listed below: Developed watery diarrhea 13 days ago, he takes imodium which did not help. He was advised to go to ER and was seen there on 02/01/21 No nausea vomiting, no fever chills.  02/01/2021  mild wall thickening of aseconding colon. Small infraumbilical ventral hernia,contains short segment of small bowel. Cirrohsis and splenomegaly. Small volume ascites, slight increase 02/03/21 chest wo contrast showed  Lytic lesion within T9 slightly increased in size., no new skeletal metastasis. Mild peripheral ground glass densities.    Review of Systems  Constitutional:  Positive for fatigue. Negative for appetite change, chills, fever and unexpected weight change.  HENT:   Negative for hearing loss and voice change.   Eyes:  Negative for eye problems and icterus.  Respiratory:  Negative for chest tightness, cough and shortness of breath.   Cardiovascular:  Negative for chest pain and leg swelling.  Gastrointestinal:  Positive for diarrhea. Negative for abdominal distention and abdominal pain.  Endocrine: Negative for hot flashes.  Genitourinary:  Negative for difficulty urinating, dysuria and frequency.   Musculoskeletal:  Positive for  arthralgias.       Chronic back pain  Skin:  Negative for itching and rash.        Hair loss  Neurological:  Negative for light-headedness and numbness.  Hematological:  Negative for adenopathy. Does not bruise/bleed easily.  Psychiatric/Behavioral:  Negative for confusion.    MEDICAL HISTORY:  Past Medical History:  Diagnosis Date   Cough    SINUS DRAINAGE CAUSING COUGHING , REPORTS CLEAR MUCOUS    Diabetes mellitus without complication (HCC)    Family history of breast cancer    Family history of colon cancer    Family history of stomach cancer    HTN (hypertension)    Hyperlipemia    Left renal mass    Platelets decreased ([place])    DENIES UNSUAL BLEEDING    PONV (postoperative nausea and vomiting)    Renal cell carcinoma ([place]) 08/04/2019   WBC decreased     SURGICAL HISTORY: Past Surgical History:  Procedure Laterality Date   ANKLE SURGERY Left    ANTERIOR CERVICAL DECOMP/DISCECTOMY FUSION N/A 04/16/2014   Procedure: ANTERIOR CERVICAL DECOMPRESSION/DISCECTOMY FUSION  (ACDF C5-C7)   (2 LEVELS)     ;  Surgeon: Melina Schools, MD;  Location: [place];  Service: Orthopedics;  Laterality: N/A;   APPENDECTOMY     BACK SURGERY     ESOPHAGOGASTRODUODENOSCOPY (EGD) WITH PROPOFOL N/A 02/06/2019   Procedure: ESOPHAGOGASTRODUODENOSCOPY (EGD) WITH PROPOFOL;  Surgeon: [name], MD;  Location[facility] END[facility]COPY;  Service: Gastroenterology;  Laterality: N/A;   ESOPHAGOGASTRODUODENOSCOPY (EGD) WITH PROPOFOL N/A 03/04/2019   Procedure: ESOPHAGOGASTRODUODENOSCOPY (EGD) WITH PROPOFOL;  Surgeon: Lin Landsman, MD;  Location: [place];  Service: Gastroenterology;  Laterality: N/A;   ESOPHAGOGASTRODUODENOSCOPY (EGD) WITH PROPOFOL N/A 04/22/2019   Procedure: ESOPHAGOGASTRODUODENOSCOPY (EGD) WITH PROPOFOL;  Surgeon: [name], MD;  Location: [facility];  Service: Gastroenterology;  Laterality: N/A;   FRACTURE SURGERY     HYDROCELE EXCISION     KNEE ARTHROSCOPY Bilateral    LAPAROSCOPIC NEPHRECTOMY, HAND ASSISTED Left 10/16/2018   Procedure: LEFT HAND ASSISTED  LAPAROSCOPIC NEPHRECTOMY;  Surgeon: Lucas Mallow, MD;  Location: [facility];  Service: Urology;  Laterality: Left;   NASAL SINUS SURGERY     X 2   PENILE PROSTHESIS IMPLANT  10 YEARS AGO   PORTA CATH INSERTION N/A 11/11/2019   Procedure: PORTA CATH INSERTION;  Surgeon: Katha Cabal, MD;  Location: [place] [facility];  Service: Cardiovascular;  Laterality: N/A;   SHOULDER SURGERY Left     SOCIAL HISTORY: Social History   Socioeconomic History   Marital status: Married    Spouse name: Not on file   Number of children: Not on file   Years of education: Not on file   Highest education level: Not on file  Occupational History   Not on file  Tobacco Use   Smoking status: Former    Packs/day: 0.25    Years: 20.00    Pack years: 5.00    Types: Cigarettes    Quit date: 2015    Years since quitting: 7.5   Smokeless tobacco: Never  Vaping Use   Vaping Use: Never used  Substance and Sexual Activity   Alcohol use: Not Currently    Alcohol/week: 0.0 standard drinks    Comment: no alchol in 1 year   Drug use: No   Sexual activity: Not on file  Other Topics Concern   Not on file  Social History Narrative   Not on file   Social  Determinants of Health   Financial Resource Strain: Not on file  Food Insecurity: Not on file  Transportation Needs: Not on file  Physical Activity: Not on file  Stress: Not on file  Social Connections: Not on file  Intimate Partner Violence: Not on file    FAMILY HISTORY: Family History  Problem Relation Age of Onset   Diabetes Mother    Cancer Mother        neuroendocrine cancer   Cancer Father        neuroendocrine cancer of meninges   Cancer Maternal Grandmother        tomach dx 69   Alzheimer's disease Paternal Grandmother    Lung cancer Paternal Grandfather    Lung cancer Maternal Aunt    Colon cancer Maternal Uncle    Breast cancer Paternal Aunt        both dx 50s   Ovarian cancer Other        dx 81   Breast cancer Cousin      ALLERGIES:  is allergic to codeine and mucinex [guaifenesin er].  MEDICATIONS:  Current Outpatient Medications  Medication Sig Dispense Refill   ACCU-CHEK AVIVA PLUS test strip CHECK BL00D SUGAR ONCE OR TWICE DAILY. 100 each PRN   calcium carbonate (OS-CAL - DOSED IN MG OF ELEMENTAL CALCIUM) 1250 (500 Ca) MG tablet Take 1 tablet by mouth.     carvedilol (COREG) 25 MG tablet Take 1 tablet (25 mg total) by mouth 2 (two) times daily with a meal. 180 tablet 3   Cholecalciferol (VITAMIN D3) 125 MCG (5000 UT) CAPS Take 5,000 Units by mouth daily.     diclofenac Sodium (VOLTAREN) 1 % GEL Apply 2 g topically 4 (four) times daily. 100 g 2   glipiZIDE (GLUCOTROL) 5 MG tablet Take 1 tablet (5 mg total) by mouth daily before breakfast. 90 tablet 3   loperamide (IMODIUM A-D) 2 MG tablet Take 2 mg by mouth 4 (four) times daily as needed for diarrhea or loose stools.     losartan (COZAAR) 100 MG tablet Take 1 tablet by mouth daily. 90 tablet 0   lovastatin (MEVACOR) 40 MG tablet Take 1 tablet (40 mg total) by mouth at bedtime. 60 tablet 0   metFORMIN (GLUCOPHAGE-XR) 500 MG 24 hr tablet Take 500 mg by mouth 2 (two) times daily. 2 QAM, 2 QHS     predniSONE (DELTASONE) 20 MG tablet Take 2 tablets (40 mg total) by mouth daily with breakfast. 14 tablet 0   prochlorperazine (COMPAZINE) 10 MG tablet Take 1 tablet (10 mg total) by mouth every 6 (six) hours as needed for nausea or vomiting. 30 tablet 0   vitamin B-12 (CYANOCOBALAMIN) 1000 MCG tablet Take 1 tablet (1,000 mcg total) by mouth daily. 90 tablet 0   amLODipine (NORVASC) 10 MG tablet Take 10 mg by mouth daily. (Patient not taking: No sig reported)     hydrochlorothiazide (HYDRODIURIL) 25 MG tablet TAKE 1 TABLET DAILY. (Patient not taking: No sig reported) 90 tablet 0   insulin aspart (NOVOLOG) 100 UNIT/ML injection Inject into the skin. Inject under the skin 3 (three) times a day before meals Sliding scale, Daughters insulin, using while sugar is very  out of wack from cancer treatments, temporary (Patient not taking: No sig reported)     insulin detemir (LEVEMIR) 100 UNIT/ML injection Inject into the skin. Inject 15 units under the skin every night at night, temporary, daughter's insulin (Patient not taking: No sig reported)  insulin lispro (INSULIN LISPRO) 100 UNIT/ML KwikPen Junior Inject into the skin 3 (three) times daily as needed (Above 250). Sliding scale (Patient not taking: No sig reported)     ofloxacin (OCUFLOX) 0.3 % ophthalmic solution Dry eye (Patient not taking: No sig reported)     No current facility-administered medications for this visit.   Facility-Administered Medications Ordered in Other Visits  Medication Dose Route Frequency Provider Last Rate Last Admin   sodium chloride flush (NS) 0.9 % injection 10 mL  10 mL Intravenous PRN Earlie Server, MD   10 mL at 01/22/20 0831   sodium chloride flush (NS) 0.9 % injection 10 mL  10 mL Intravenous PRN Earlie Server, MD   10 mL at 02/04/21 0831     PHYSICAL EXAMINATION: ECOG PERFORMANCE STATUS: 1 - Symptomatic but completely ambulatory Vitals:   02/04/21 0917  BP: (!) 118/52  Pulse: 75  Resp: 18  Temp: (!) 96.6 F (35.9 C)  SpO2: 100%   Filed Weights   02/04/21 0917  Weight: 241 lb (109.3 kg)    Physical Exam Constitutional:      General: He is not in acute distress.    Appearance: He is obese.  HENT:     Head: Normocephalic and atraumatic.  Eyes:     General: No scleral icterus. Cardiovascular:     Rate and Rhythm: Normal rate and regular rhythm.     Heart sounds: Normal heart sounds.  Pulmonary:     Effort: Pulmonary effort is normal. No respiratory distress.     Breath sounds: No wheezing.  Abdominal:     General: Bowel sounds are normal. There is no distension.     Palpations: Abdomen is soft.  Musculoskeletal:        General: No deformity. Normal range of motion.     Cervical back: Normal range of motion and neck supple.  Skin:    General: Skin is warm  and dry.     Findings: No erythema or rash.  Neurological:     Mental Status: He is alert and oriented to person, place, and time. Mental status is at baseline.     Cranial Nerves: No cranial nerve deficit.     Coordination: Coordination normal.  Psychiatric:        Mood and Affect: Mood normal.    LABORATORY DATA:  I have reviewed the data as listed Lab Results  Component Value Date   WBC 2.1 (L) 02/04/2021   HGB 9.7 (L) 02/04/2021   HCT 28.9 (L) 02/04/2021   MCV 92.9 02/04/2021   PLT 65 (L) 02/04/2021   Recent Labs    04/01/20 0837 04/15/20 0853 04/29/20 0849 05/21/20 1253 01/21/21 0802 02/01/21 1636 02/04/21 0831  NA 139 135 138   < > 136 134* 135  K 4.3 4.3 4.2   < > 4.8 4.6 4.1  CL 107 104 108   < > 108 104 103  CO2 21* 21* 23   < > _0 GLUCOSE 123* 162* 109*   < > 148* 135* 123*  BUN 24* 22* 20   < > 24* 24* 31*  CREATININE 1.69* 1.87* 1.58*   < > 1.87* 1.77* 2.26*  CALCIUM 8.5* 8.6* 8.7*   < > 8.8* 9.5 8.9  GFRNONAA 44* 39* 48*   < > 41* 44* 33*  GFRAA 51* 45* 55*  --   --   --   --   PROT 7.9 7.8 8.0   < >  7.3 8.0 7.7  ALBUMIN 4.0 4.0 4.3   < > 3.6 3.9 3.8  AST 31 34 29   < > 36 32 36  ALT _0 < > _1 ALKPHOS 102 117 108   < > 109 96 109  BILITOT 0.9 0.8 1.5*   < > 0.7 1.0 1.0   < > = values in this interval not displayed.    Iron/TIBC/Ferritin/ %Sat    Component Value Date/Time   IRON 60 09/02/2020 0822   IRON 105 10/15/2018 1325   TIBC 293 09/02/2020 0822   TIBC 244 (L) 10/15/2018 1325   FERRITIN 164 09/02/2020 0822   FERRITIN 588 (H) 10/15/2018 1325   IRONPCTSAT 21 09/02/2020 0822   IRONPCTSAT 43 10/15/2018 1325      RADIOGRAPHIC STUDIES: I have personally reviewed the radiological images as listed and agreed with the findings in the report. CT ABDOMEN PELVIS WO CONTRAST  Result Date: 02/01/2021 CLINICAL DATA:  Abdominal abscess/infection suspected Abdominal pain, acute, nonlocalized Patient reports abdominal pain and  pressure with diarrhea. Immunotherapy 624 for renal cell cancer. EXAM: CT ABDOMEN AND PELVIS WITHOUT CONTRAST TECHNIQUE: Multidetector CT imaging of the abdomen and pelvis was performed following the standard protocol without IV contrast. COMPARISON:  Most recent CT 10/12/2020 FINDINGS: Lower chest: No acute airspace disease or pleural effusion. There are coronary artery calcifications. Hepatobiliary: Cirrhotic hepatic morphology with nodular contours. No evidence of focal hepatic lesion on this unenhanced exam. Distended gallbladder with layering gallstones. No biliary dilatation. Pancreas: No ductal dilatation or inflammation. Spleen: Splenomegaly with spleen spanning 20 cm cranial caudal. No focal splenic abnormality. Adrenals/Urinary Tract: Normal adrenal glands. Left nephrectomy without abnormal soft tissue density in the nephrectomy bed. No right hydronephrosis or renal calculi. Mild right perinephric edema. No focal renal abnormality. Partially distended urinary bladder. Stomach/Bowel: The stomach is decompressed. Infraumbilical ventral abdominal wall hernia contains short segment of small bowel, however no wall thickening or proximal dilatation. No obstruction. There are occasional fluid-filled loops of small bowel in the lower pelvis. Normal appendix. Mild wall thickening of the ascending colon may represent portal colopathy or colitis. There is liquid stool in the descending and sigmoid colon. No bowel pneumatosis. Vascular/Lymphatic: Advanced aortic and branch atherosclerosis, particularly for age. No portal venous or mesenteric gas. Venous prominence at the portal splenic confluence is stable from prior exam, and not well assessed on this unenhanced exam. Scattered upper abdominal and periportal lymph nodes, not enlarged by size criteria. Reproductive: Prostate is unremarkable.  Penile prosthesis in place. Other: Small volume abdominopelvic ascites, with slight increase from prior exam. Similar  generalized mesenteric stranding. Infraumbilical ventral abdominal wall hernia just to the left of midline, series 2, image 66, contains short segment of small bowel. Musculoskeletal: Lytic lesion within T9 vertebral body has increased in size from prior exam. Tiny lytic lesion within inferior L3 vertebral bodies unchanged. No definite new osseous abnormality. IMPRESSION: 1. Mild wall thickening of the ascending colon may represent portal colopathy or colitis. Liquid stool in the descending and sigmoid colon, can be seen with diarrheal illness. 2. Small infraumbilical ventral abdominal wall hernia contains short segment of small bowel, however no wall thickening or proximal dilatation/obstruction. 3. Cirrhosis and splenomegaly. Small volume abdominopelvic ascites, with slight increase from prior exam. 4. Post left nephrectomy. 5. Lytic lesion within T9 vertebral body has minimally increased in size from prior exam. Tiny lytic lesion within inferior L3 vertebral body unchanged. 6. Advanced aortic and branch atherosclerosis.  Aortic Atherosclerosis (ICD10-I70.0). Electronically Signed   By: Keith Rake M.D.   On: 02/01/2021 18:29   CT CHEST WO CONTRAST  Result Date: 02/03/2021 CLINICAL DATA:  New cell carcinoma. LEFT nephrectomy. T9 metastasis post radiation treatment. EXAM: CT CHEST WITHOUT CONTRAST TECHNIQUE: Multidetector CT imaging of the chest was performed following the standard protocol without IV contrast. COMPARISON:  CT abdomen pelvis 02/01/2021 FINDINGS: Cardiovascular: Port in the anterior chest wall with tip in distal SVC. Coronary artery calcification and aortic atherosclerotic calcification. Mediastinum/Nodes: No axillary or supraclavicular adenopathy. No mediastinal or hilar adenopathy. No pericardial fluid. Esophagus normal. Lungs/Pleura: Several peripheral ground-glass opacities are present. Measurable nodularity. Upper Abdomen: Liver is nodular. The spleen is enlarged. There is simple fluid  attenuation fluid surrounding the margin of the liver and spleen which is similar to exam 2 days prior. Caudate lobe is enlarged. Haziness in the retroperitoneal fat adjacent to the pancreas Musculoskeletal: Lytic lesion in the T9 vertebral body is increased measuring 22 mm by 21 mm (image 103/series 2) compared to 15 mm by 13 mm on CT 12/12/2020. No new sites lytic lesions are present within the spine or ribs. IMPRESSION: 1. Increase in size of lytic lesion in the T9 vertebral body compared to CT 10/12/2020. 2. No evidence of new skeletal metastasis. 3. Peripheral ground-glass densities suggest pulmonary infection/inflammation. This is subtle mild finding. 4. Fluid within the abdomen pelvis may relate to ascites from portal hypertension. Spleen is enlarged. Morphologic changes in the liver suggests cirrhosis. Electronically Signed   By: Suzy Bouchard M.D.   On: 02/03/2021 16:07      ASSESSMENT & PLAN:  1. Diarrhea, unspecified type   2. Renal cell carcinoma of left kidney (HCC)   3. Thrombocytopenia ([place])   4. Other neutropenia ([place])   5. 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 02/01/21, 02/03/2021 CT was reviewed and discussed with patient Overall stable disease.  Hold immunotherapy today. See below.   # Acute onset diarrhea, ? Immunotherapy related side effects, vs infectious.  Check c diff, GI virus panel.  If negative, recommend prednisone 21m daily x 7 daily.    #T9 lesion, status post palliative radiation.  No significant symptom improvement yet. Chronic back pain, slightly worse, probably due to chronic degenerative disease.  Advised patient to apply Voltaren cream to the area.  Monitor symptoms.  #Chronic thrombocytopenia and neutropenia,  due to liver cirrhosis His counts is overall stable.  Monitor.  #Chronic kidney disease, stable level Encourage oral hydration  and avoid nephrotoxin. He will receive 1 L of NS today  #Anemia, chronic, hemoglobin is 9.7 stable. monitor  #Diabetes,  He is off metformin.  Glucose is 123   Follow-up in 1 week lab MD+ IVF  All questions were answered. The patient knows to call the clinic with any problems questions or concerns.   ZEarlie Server MD, PhD Hematology Oncology [facility] ANew [facility] InpatientPager- [phone]/02/2021

## 2021-02-04 NOTE — Patient Instructions (Signed)
[facility] [place] [facility]  Discharge Instructions: Thank you for choosing [facility] to provide your oncology and hematology care.  If you have a lab appointment with the Cancer Center, please go directly to the Cancer Center and check in at the registration area.  Wear comfortable clothing and clothing appropriate for easy access to any Portacath or PICC line.   We strive to give you quality time with your provider. You may need to reschedule your appointment if you arrive late (15 or more minutes).  Arriving late affects you and other patients whose appointments are after yours.  Also, if you miss three or more appointments without notifying the office, you may be dismissed from the clinic at the provider's discretion.      For prescription refill requests, have your pharmacy contact our office and allow 72 hours for refills to be completed.    Today you received the following : hydration   To help prevent nausea and vomiting after your treatment, we encourage you to take your nausea medication as directed.  BELOW ARE SYMPTOMS THAT SHOULD BE REPORTED IMMEDIATELY: *FEVER GREATER THAN 100.4 F (38 C) OR HIGHER *CHILLS OR SWEATING *NAUSEA AND VOMITING THAT IS NOT CONTROLLED WITH YOUR NAUSEA MEDICATION *UNUSUAL SHORTNESS OF BREATH *UNUSUAL BRUISING OR BLEEDING *URINARY PROBLEMS (pain or burning when urinating, or frequent urination) *BOWEL PROBLEMS (unusual diarrhea, constipation, pain near the anus) TENDERNESS IN MOUTH AND THROAT WITH OR WITHOUT PRESENCE OF ULCERS (sore throat, sores in mouth, or a toothache) UNUSUAL RASH, SWELLING OR PAIN  UNUSUAL VAGINAL DISCHARGE OR ITCHING   Items with * indicate a potential emergency and should be followed up as soon as possible or go to the Emergency Department if any problems should occur.  Please show the CHEMOTHERAPY ALERT CARD or IMMUNOTHERAPY ALERT CARD at check-in to the Emergency Department and triage  nurse.  Should you have questions after your visit or need to cancel or reschedule your appointment, please contact [facility]  [phone] and follow the prompts.  Office hours are 8:00 a.m. to 4:30 p.m. Monday - Friday. Please note that voicemails left after 4:00 p.m. may not be returned until the following business day.  We are closed weekends and major holidays. You have access to a nurse at all times for urgent questions. Please call the main number to the clinic [phone] and follow the prompts.  For any non-urgent questions, you may also contact your provider using MyChart. We now offer e-Visits for anyone 18 and older to request care online for non-urgent symptoms. For details visit [email or web address].   Also download the MyChart app! Go to the app store, search "MyChart", open the app, select [facility], and log in with your MyChart username and password.  Due to Covid, a mask is required upon entering the hospital/clinic. If you do not have a mask, one will be given to you upon arrival. For doctor visits, patients may have 1 support person aged 18 or older with them. For treatment visits, patients cannot have anyone with them due to current Covid guidelines and our immunocompromised population.  

## 2021-02-07 ENCOUNTER — Other Ambulatory Visit: Payer: Self-pay

## 2021-02-07 ENCOUNTER — Inpatient Hospital Stay: Payer: Medicare Other

## 2021-02-07 VITALS — BP 163/75 | HR 71 | Temp 96.8°F | Resp 18

## 2021-02-07 DIAGNOSIS — E860 Dehydration: Secondary | ICD-10-CM

## 2021-02-07 DIAGNOSIS — C642 Malignant neoplasm of left kidney, except renal pelvis: Secondary | ICD-10-CM | POA: Diagnosis not present

## 2021-02-07 MED ORDER — HEPARIN SOD (PORK) LOCK FLUSH 100 UNIT/ML IV SOLN
500.0000 [IU] | Freq: Once | INTRAVENOUS | Status: AC
  Administered 2021-02-07: 500 [IU] via INTRAVENOUS
  Filled 2021-02-07: qty 5

## 2021-02-07 MED ORDER — SODIUM CHLORIDE 0.9% FLUSH
10.0000 mL | INTRAVENOUS | Status: DC | PRN
  Administered 2021-02-07: 10 mL via INTRAVENOUS
  Filled 2021-02-07: qty 10

## 2021-02-07 MED ORDER — SODIUM CHLORIDE 0.9 % IV SOLN
Freq: Once | INTRAVENOUS | Status: AC
Start: 2021-02-07 — End: 2021-02-07
  Filled 2021-02-07: qty 250

## 2021-02-07 NOTE — Progress Notes (Signed)
Reports diarrhea is definitely better. Prednisone is raising his blood sugars. Trying to eat protein basically; steak, pork chops. Receiving 1 liter of NS today.

## 2021-02-11 ENCOUNTER — Inpatient Hospital Stay: Payer: Medicare Other

## 2021-02-11 ENCOUNTER — Inpatient Hospital Stay (HOSPITAL_BASED_OUTPATIENT_CLINIC_OR_DEPARTMENT_OTHER): Payer: Medicare Other | Admitting: Oncology

## 2021-02-11 ENCOUNTER — Encounter: Payer: Self-pay | Admitting: Oncology

## 2021-02-11 VITALS — BP 160/94 | HR 68 | Temp 96.3°F | Resp 16 | Wt 247.5 lb

## 2021-02-11 DIAGNOSIS — D631 Anemia in chronic kidney disease: Secondary | ICD-10-CM

## 2021-02-11 DIAGNOSIS — N1832 Chronic kidney disease, stage 3b: Secondary | ICD-10-CM

## 2021-02-11 DIAGNOSIS — D708 Other neutropenia: Secondary | ICD-10-CM | POA: Diagnosis not present

## 2021-02-11 DIAGNOSIS — D696 Thrombocytopenia, unspecified: Secondary | ICD-10-CM

## 2021-02-11 DIAGNOSIS — C642 Malignant neoplasm of left kidney, except renal pelvis: Secondary | ICD-10-CM

## 2021-02-11 DIAGNOSIS — R197 Diarrhea, unspecified: Secondary | ICD-10-CM

## 2021-02-11 LAB — CBC WITH DIFFERENTIAL/PLATELET
Abs Immature Granulocytes: 0.07 10*3/uL (ref 0.00–0.07)
Basophils Absolute: 0 10*3/uL (ref 0.0–0.1)
Basophils Relative: 0 %
Eosinophils Absolute: 0 10*3/uL (ref 0.0–0.5)
Eosinophils Relative: 1 %
HCT: 28.9 % — ABNORMAL LOW (ref 39.0–52.0)
Hemoglobin: 9.8 g/dL — ABNORMAL LOW (ref 13.0–17.0)
Immature Granulocytes: 3 %
Lymphocytes Relative: 12 %
Lymphs Abs: 0.3 10*3/uL — ABNORMAL LOW (ref 0.7–4.0)
MCH: 31.4 pg (ref 26.0–34.0)
MCHC: 33.9 g/dL (ref 30.0–36.0)
MCV: 92.6 fL (ref 80.0–100.0)
Monocytes Absolute: 0.3 10*3/uL (ref 0.1–1.0)
Monocytes Relative: 10 %
Neutro Abs: 1.9 10*3/uL (ref 1.7–7.7)
Neutrophils Relative %: 74 %
Platelets: 69 10*3/uL — ABNORMAL LOW (ref 150–400)
RBC: 3.12 MIL/uL — ABNORMAL LOW (ref 4.22–5.81)
RDW: 13.7 % (ref 11.5–15.5)
WBC: 2.6 10*3/uL — ABNORMAL LOW (ref 4.0–10.5)
nRBC: 0 % (ref 0.0–0.2)

## 2021-02-11 LAB — COMPREHENSIVE METABOLIC PANEL
ALT: 54 U/L — ABNORMAL HIGH (ref 0–44)
AST: 55 U/L — ABNORMAL HIGH (ref 15–41)
Albumin: 3.7 g/dL (ref 3.5–5.0)
Alkaline Phosphatase: 80 U/L (ref 38–126)
Anion gap: 7 (ref 5–15)
BUN: 52 mg/dL — ABNORMAL HIGH (ref 6–20)
CO2: 20 mmol/L — ABNORMAL LOW (ref 22–32)
Calcium: 8.8 mg/dL — ABNORMAL LOW (ref 8.9–10.3)
Chloride: 109 mmol/L (ref 98–111)
Creatinine, Ser: 1.72 mg/dL — ABNORMAL HIGH (ref 0.61–1.24)
GFR, Estimated: 46 mL/min — ABNORMAL LOW (ref >60)
Glucose, Bld: 63 mg/dL — ABNORMAL LOW (ref 70–99)
Potassium: 3.5 mmol/L (ref 3.5–5.1)
Sodium: 136 mmol/L (ref 135–145)
Total Bilirubin: 0.6 mg/dL (ref 0.3–1.2)
Total Protein: 7.2 g/dL (ref 6.5–8.1)

## 2021-02-11 MED ORDER — HEPARIN SOD (PORK) LOCK FLUSH 100 UNIT/ML IV SOLN
INTRAVENOUS | Status: AC
  Filled 2021-02-11: qty 5

## 2021-02-11 MED ORDER — SODIUM CHLORIDE 0.9 % IV SOLN
Freq: Once | INTRAVENOUS | Status: AC
  Filled 2021-02-11: qty 250

## 2021-02-11 MED ORDER — HEPARIN SOD (PORK) LOCK FLUSH 100 UNIT/ML IV SOLN
500.0000 [IU] | Freq: Once | INTRAVENOUS | Status: AC
Start: 2021-02-11 — End: 2021-02-11
  Administered 2021-02-11: 500 [IU] via INTRAVENOUS
  Filled 2021-02-11: qty 5

## 2021-02-11 MED ORDER — SODIUM CHLORIDE 0.9% FLUSH
10.0000 mL | Freq: Once | INTRAVENOUS | Status: AC
Start: 2021-02-11 — End: 2021-02-11
  Administered 2021-02-11: 10 mL via INTRAVENOUS
  Filled 2021-02-11: qty 10

## 2021-02-11 MED ORDER — PREDNISONE 10 MG PO TABS: 10.0000 mg | ORAL_TABLET | Freq: Every day | ORAL | 0 refills | Status: DC

## 2021-02-11 MED ORDER — PREDNISONE 20 MG PO TABS: 20.0000 mg | ORAL_TABLET | Freq: Every day | ORAL | 0 refills | Status: DC

## 2021-02-11 NOTE — Progress Notes (Signed)
Hematology/Oncology follow-up [facility] [phone] [phone]   Patient Care Team: Albina Billet, MD as PCP - General (Internal Medicine) Earlie Server, MD as Consulting Physician (Hematology and Oncology)  REFERRING PROVIDER: Albina Billet, MD  CHIEF COMPLAINTS/REASON FOR VISIT:  Follow-up for kidney cancer treatments  HISTORY OF PRESENTING ILLNESS:   Henry Moore is a  60 y.o.  male with PMH listed below was seen in consultation at the request of  Albina Billet, MD  for evaluation of kidney cancer Extensive medical records were reviewed Patient had MRI lumbar spine without contrast done on 07/13/2018 which showed mired lumbar degenerative disease. CT thoracic spine was done on 08/05/2018 which showed 5 cm left renal mass. 09/04/2018 CT abdomen and pelvis with and without contrast showed 5 cm round enhancing solid mass in the left kidney.  No involvement of left renal vein.  No lymphadenopathy Morphologic changes consistent with cirrhosis and the portal hypertension.  Small volume ascites and splenomegaly. . 10/16/2018 Left nephrectomy showed renal cell carcinoma, clear-cell type, nuclear grade 4, tumor extends into the renal vein and renal sinus fat.  Urethral, vascular and or resection margins are negative for tumor pT3a Nx Mx  01/28/2019 patient had another CT chest abdomen pelvis done with contrast Showed interval left nephrectomy.  Scattered tiny pulmonary nodules bilaterally.  Nonspecific.  No other evidence of metastatic disease.  Cirrhosis changes extensive coronary and aortic atherosclerosis  07/14/2019 patient underwent surveillance CT chest abdomen pelvis without contrast Interval progression of pulmonary metastasis with new enlarged right paratracheal lymph node 1.7 cm, previously 0.6 cm one-point since worrisome for metastatic adenopathy.  Multiple progressive pulmonary nodules, index nodule within the lingula measures 1.3 cm, previously  3 mm. Index subpleural nodule within the anterior right upper lobe measuring 1.8 cm, previously 3 cm Index nodule within the post lateral right lower lobe measuring 5 mm, new from previous care Aortic sclerosis.  Three-vessel coronary artery calcification noted.  Cirrhosis changes.  Splenomegaly.  Patient was referred to establish care with medical oncology for further discussion and evaluation of metastatic renal cell carcinoma. Patient reports feeling well at baseline today.  Denies any shortness of breath, cough, hemoptysis, back pain, headache. He quitted smoking 2015.  Not currently actively drinking alcohol as well. Patient has chronic back pain unchanged.  #08/14/2019-10/16/2019 nivolumab and ipilimumab. #11/06/2019-nivolumab every 2 weeks maintenance.  # Liver cirrhosis with small amount of ascites/splenomegaly patient sees gastroenterology Dr. [name].  . He will get ultrasound surveillance due in February 2021 for screening of [name].  EGD surveillance for Barrett's plan in April 2021.  # Diabetes, on Humalog sliding scale and metformin.  #Family history of cancer and personal history of RCC.  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  Status post ipilimumab and nivolumab treatment.  # 10/29/2019 CT chest abdomen pelvis without contrast images were independently reviewed by me and discussed with patient.   CT showed marked improvement with resolution of many of the pulmonary nodules, prominent reduced size of other nodules.  Considerably reduced size of the right lower paratracheal lymph node now 0.9 cm in diameter. Hepatic cirrhosis with prominent splenomegaly and trace perisplenic ascites. Chronic CT findings includes aortic atherosclerotic vascular  disease.  Mild sigmoid colon diverticulosis  # CKD, he  establish care with Dr. Candiss Norse for chronic kidney disease.  Blood pressure medication has been adjusted.  Hydrochlorothiazide decreased to 12.5 mg daily.  # NGS -foundation one PD-L1 TPS 0% no reportable mutation, MS stable. TMB 8 muts/mb VHL [id number]*16, SETD2 BAP Genetic testing is negative.   # 07/16/2020, CT chest abdomen pelvis images were reviewed and discussed with patient Compared to July 2021 image, he has had a new patchy groundglass opacities throughout both lungs with central part of solids/airspace components.  Findings are nonspecific and could be secondary to an atypical infection, including viral pneumonia or inflammation.  Patient is asymptomatic.  I wonder the changes are secondary to his Covid infection.  Patient has been started on Levaquin to cover atypical infection by on-call physician and I recommend him to continue and complete the course. Proceed with today's immunotherapy nivolumab.  Advised patient to notify me if his symptoms get worse.  # 10/12/2020 CT chest abdomen pelvis with out contrast. New 15 mm lytic lesion in the right T9 vertebral body.  Suspicious for isolated lytic metastasis.  No findings of suspicious soft tissue metastasis in the chest abdomen or pelvis.  Residual postinfectious inflammatory scarring in the lung bilaterally.  Cirrhosis.  Splenomegaly.  Minimal ascites.  # 10/26/20 Bone scan showed Minimal increased activity noted at the right costovertebral junction at the T9 level is most likely degenerative. Similar finding noted on prior bone scan of 08/11/2019. No definite T9 vertebral body abnormal activity corresponding to lytic lesion noted on recent CT. Again the recently identified T9 vertebral body lytic lesion is suspicious and further evaluation of the thoracic spine with MRI can be obtained. No other bony abnormalities noted by bone scan. I discussed patient's case on tumor board 11/04/2020.  Bone  scan probably is not sensitive in detecting lytic lesion from [place].  Consensus reached upon clinically based on the CT scan, new bone lesion is highly likely metastasis from [place].  Options including proceed with bone lesion biopsy versus referral to radiation oncology for empiric palliative radiation.  Patient has other comorbidities including cirrhosis/pancytopenia, patient opts to proceed with empiric palliative radiation.  12/13/2020 finished RT to T9 June 2022 abscess of anal area , s/ drainage and antibiotics.   02/01/2021  mild wall thickening of aseconding colon. Small infraumbilical ventral hernia,contains short segment of small bowel. Cirrohsis and splenomegaly. Small volume ascites, slight increase 02/03/21 chest wo contrast showed  Lytic lesion within T9 slightly increased in size., no new skeletal metastasis. Mild peripheral ground glass densities.    INTERVAL HISTORY Henry Moore is a 60 y.o. male who has above history reviewed by me today presents for follow up visit for management of stage IV RCC  Still have diarrhea, although symptoms are better. He usually has loose BM 4-5 hours after his meals.  He did not submit stool sample for evaluaton.  No fever chills, abdominal pain.   Review of Systems  Constitutional:  Positive for fatigue. Negative for appetite change, chills, fever and unexpected weight change.  HENT:   Negative for hearing loss and voice change.   Eyes:  Negative for eye problems and icterus.  Respiratory:  Negative for chest tightness, cough and shortness of breath.   Cardiovascular:  Negative for chest pain and leg swelling.  Gastrointestinal:  Positive for diarrhea. Negative for abdominal distention and abdominal pain.  Endocrine: Negative for hot flashes.  Genitourinary:  Negative for difficulty urinating, dysuria and frequency.   Musculoskeletal:  Positive for arthralgias.  Chronic back pain  Skin:  Negative for itching and rash.       Hair loss   Neurological:  Negative for light-headedness and numbness.  Hematological:  Negative for adenopathy. Does not bruise/bleed easily.  Psychiatric/Behavioral:  Negative for confusion.    MEDICAL HISTORY:  Past Medical History:  Diagnosis Date   Cough    SINUS DRAINAGE CAUSING COUGHING , REPORTS CLEAR MUCOUS    Diabetes mellitus without complication (HCC)    Family history of breast cancer    Family history of colon cancer    Family history of stomach cancer    HTN (hypertension)    Hyperlipemia    Left renal mass    Platelets decreased ([place])    DENIES UNSUAL BLEEDING    PONV (postoperative nausea and vomiting)    Renal cell carcinoma ([place]) 08/04/2019   WBC decreased     SURGICAL HISTORY: Past Surgical History:  Procedure Laterality Date   ANKLE SURGERY Left    ANTERIOR CERVICAL DECOMP/DISCECTOMY FUSION N/A 04/16/2014   Procedure: ANTERIOR CERVICAL DECOMPRESSION/DISCECTOMY FUSION  (ACDF C5-C7)   (2 LEVELS)     ;  Surgeon: Melina Schools, MD;  Location: [facility];  Service: Orthopedics;  Laterality: N/A;   APPENDECTOMY     BACK SURGERY     ESOPHAGOGASTRODUODENOSCOPY (EGD) WITH PROPOFOL N/A 02/06/2019   Procedure: ESOPHAGOGASTRODUODENOSCOPY (EGD) WITH PROPOFOL;  Surgeon: [name], MD;  Location: [facility] ENDOSCOPY;  Service: Gastroenterology;  Laterality: N/A;   ESOPHAGOGASTRODUODENOSCOPY (EGD) WITH PROPOFOL N/A 03/04/2019   Procedure: ESOPHAGOGASTRODUODENOSCOPY (EGD) WITH PROPOFOL;  Surgeon: Lin Landsman, MD;  Location: [place];  Service: Gastroenterology;  Laterality: N/A;   ESOPHAGOGASTRODUODENOSCOPY (EGD) WITH PROPOFOL N/A 04/22/2019   Procedure: ESOPHAGOGASTRODUODENOSCOPY (EGD) WITH PROPOFOL;  Surgeon: [name], MD;  Location: [facility] ENDOSCOPY;  Service: Gastroenterology;  Laterality: N/A;   FRACTURE SURGERY     HYDROCELE EXCISION     KNEE ARTHROSCOPY Bilateral    LAPAROSCOPIC NEPHRECTOMY, HAND ASSISTED Left 10/16/2018   Procedure: LEFT HAND ASSISTED LAPAROSCOPIC  NEPHRECTOMY;  Surgeon: Lucas Mallow, MD;  Location: [facility];  Service: Urology;  Laterality: Left;   NASAL SINUS SURGERY     X 2   PENILE PROSTHESIS IMPLANT  10 YEARS AGO   PORTA CATH INSERTION N/A 11/11/2019   Procedure: PORTA CATH INSERTION;  Surgeon: Katha Cabal, MD;  Location: [place] CV LAB;  Service: Cardiovascular;  Laterality: N/A;   SHOULDER SURGERY Left     SOCIAL HISTORY: Social History   Socioeconomic History   Marital status: Married    Spouse name: Not on file   Number of children: Not on file   Years of education: Not on file   Highest education level: Not on file  Occupational History   Not on file  Tobacco Use   Smoking status: Former    Packs/day: 0.25    Years: 20.00    Pack years: 5.00    Types: Cigarettes    Quit date: 2015    Years since quitting: 7.5   Smokeless tobacco: Never  Vaping Use   Vaping Use: Never used  Substance and Sexual Activity   Alcohol use: Not Currently    Alcohol/week: 0.0 standard drinks    Comment: no alchol in 1 year   Drug use: No   Sexual activity: Not on file  Other Topics Concern   Not on file  Social History Narrative   Not on file   Social Determinants of Radio broadcast assistant  Strain: Not on file  Food Insecurity: Not on file  Transportation Needs: Not on file  Physical Activity: Not on file  Stress: Not on file  Social Connections: Not on file  Intimate Partner Violence: Not on file    FAMILY HISTORY: Family History  Problem Relation Age of Onset   Diabetes Mother    Cancer Mother        neuroendocrine cancer   Cancer Father        neuroendocrine cancer of meninges   Cancer Maternal Grandmother        tomach dx 11   Alzheimer's disease Paternal Grandmother    Lung cancer Paternal Grandfather    Lung cancer Maternal Aunt    Colon cancer Maternal Uncle    Breast cancer Paternal Aunt        both dx 65s   Ovarian cancer Other        dx 76   Breast cancer Cousin      ALLERGIES:  is allergic to codeine and mucinex [guaifenesin er].  MEDICATIONS:  Current Outpatient Medications  Medication Sig Dispense Refill   ACCU-CHEK AVIVA PLUS test strip CHECK BL00D SUGAR ONCE OR TWICE DAILY. 100 each PRN   calcium carbonate (OS-CAL - DOSED IN MG OF ELEMENTAL CALCIUM) 1250 (500 Ca) MG tablet Take 1 tablet by mouth.     carvedilol (COREG) 25 MG tablet Take 1 tablet (25 mg total) by mouth 2 (two) times daily with a meal. 180 tablet 3   Cholecalciferol (VITAMIN D3) 125 MCG (5000 UT) CAPS Take 5,000 Units by mouth daily.     diclofenac Sodium (VOLTAREN) 1 % GEL Apply 2 g topically 4 (four) times daily. 100 g 2   glipiZIDE (GLUCOTROL) 5 MG tablet Take 1 tablet (5 mg total) by mouth daily before breakfast. 90 tablet 3   insulin aspart (NOVOLOG) 100 UNIT/ML injection Inject into the skin. Inject under the skin 3 (three) times a day before meals Sliding scale, Daughters insulin, using while sugar is very out of wack from cancer treatments, temporary     insulin detemir (LEVEMIR) 100 UNIT/ML injection Inject into the skin. Inject 15 units under the skin every night at night, temporary, daughter's insulin     loperamide (IMODIUM A-D) 2 MG tablet Take 2 mg by mouth 4 (four) times daily as needed for diarrhea or loose stools.     losartan (COZAAR) 100 MG tablet Take 1 tablet by mouth daily. 90 tablet 0   lovastatin (MEVACOR) 40 MG tablet Take 1 tablet (40 mg total) by mouth at bedtime. 60 tablet 0   metFORMIN (GLUCOPHAGE-XR) 500 MG 24 hr tablet Take 500 mg by mouth 2 (two) times daily. 1 QAM, 1 QPM     [START ON 02/15/2021] predniSONE (DELTASONE) 10 MG tablet Take 1 tablet (10 mg total) by mouth daily with breakfast. 3 tablet 0   predniSONE (DELTASONE) 20 MG tablet Take 1 tablet (20 mg total) by mouth daily with breakfast. 3 tablet 0   prochlorperazine (COMPAZINE) 10 MG tablet Take 1 tablet (10 mg total) by mouth every 6 (six) hours as needed for nausea or vomiting. 30 tablet 0    vitamin B-12 (CYANOCOBALAMIN) 1000 MCG tablet Take 1 tablet (1,000 mcg total) by mouth daily. 90 tablet 0   amLODipine (NORVASC) 10 MG tablet Take 10 mg by mouth daily. (Patient not taking: No sig reported)     hydrochlorothiazide (HYDRODIURIL) 25 MG tablet TAKE 1 TABLET DAILY. (Patient not taking: No sig reported)  90 tablet 0   insulin lispro (INSULIN LISPRO) 100 UNIT/ML KwikPen Junior Inject into the skin 3 (three) times daily as needed (Above 250). Sliding scale (Patient not taking: Reported on 02/11/2021)     ofloxacin (OCUFLOX) 0.3 % ophthalmic solution Dry eye (Patient not taking: No sig reported)     No current facility-administered medications for this visit.   Facility-Administered Medications Ordered in Other Visits  Medication Dose Route Frequency Provider Last Rate Last Admin   heparin lock flush 100 unit/mL  500 Units Intravenous Once Earlie Server, MD       sodium chloride flush (NS) 0.9 % injection 10 mL  10 mL Intravenous PRN Earlie Server, MD   10 mL at 01/22/20 0831     PHYSICAL EXAMINATION: ECOG PERFORMANCE STATUS: 1 - Symptomatic but completely ambulatory Vitals:   02/11/21 0934  BP: (!) 160/94  Pulse: 68  Resp: 16  Temp: (!) 96.3 F (35.7 C)   Filed Weights   02/11/21 0934  Weight: 247 lb 8 oz (112.3 kg)    Physical Exam Constitutional:      General: He is not in acute distress.    Appearance: He is obese.  HENT:     Head: Normocephalic and atraumatic.  Eyes:     General: No scleral icterus. Cardiovascular:     Rate and Rhythm: Normal rate and regular rhythm.     Heart sounds: Normal heart sounds.  Pulmonary:     Effort: Pulmonary effort is normal. No respiratory distress.     Breath sounds: No wheezing.  Abdominal:     General: Bowel sounds are normal. There is no distension.     Palpations: Abdomen is soft.  Musculoskeletal:        General: No deformity. Normal range of motion.     Cervical back: Normal range of motion and neck supple.  Skin:    General:  Skin is warm and dry.     Findings: No erythema or rash.  Neurological:     Mental Status: He is alert and oriented to person, place, and time. Mental status is at baseline.     Cranial Nerves: No cranial nerve deficit.     Coordination: Coordination normal.  Psychiatric:        Mood and Affect: Mood normal.    LABORATORY DATA:  I have reviewed the data as listed Lab Results  Component Value Date   WBC 2.6 (L) 02/11/2021   HGB 9.8 (L) 02/11/2021   HCT 28.9 (L) 02/11/2021   MCV 92.6 02/11/2021   PLT 69 (L) 02/11/2021   Recent Labs    04/01/20 0837 04/15/20 0853 04/29/20 0849 05/21/20 1253 02/01/21 1636 02/04/21 0831 02/11/21 0905  NA 139 135 138   < > 134* 135 136  K 4.3 4.3 4.2   < > 4.6 4.1 3.5  CL 107 104 108   < > 104 103 109  CO2 21* 21* 23   < > 24 23 20*  GLUCOSE 123* 162* 109*   < > 135* 123* 63*  BUN 24* 22* 20   < > 24* 31* 52*  CREATININE 1.69* 1.87* 1.58*   < > 1.77* 2.26* 1.72*  CALCIUM 8.5* 8.6* 8.7*   < > 9.5 8.9 8.8*  GFRNONAA 44* 39* 48*   < > 44* 33* 46*  GFRAA 51* 45* 55*  --   --   --   --   PROT 7.9 7.8 8.0   < > 8.0 7.7 7.2  ALBUMIN 4.0 4.0 4.3   < > 3.9 3.8 3.7  AST 31 34 29   < > 32 36 55*  ALT $Re'24 27 21   'ztv$ < > 18 20 54*  ALKPHOS 102 117 108   < > 96 109 80  BILITOT 0.9 0.8 1.5*   < > 1.0 1.0 0.6   < > = values in this interval not displayed.    Iron/TIBC/Ferritin/ %Sat    Component Value Date/Time   IRON 60 09/02/2020 0822   IRON 105 10/15/2018 1325   TIBC 293 09/02/2020 0822   TIBC 244 (L) 10/15/2018 1325   FERRITIN 164 09/02/2020 0822   FERRITIN 588 (H) 10/15/2018 1325   IRONPCTSAT 21 09/02/2020 0822   IRONPCTSAT 43 10/15/2018 1325      RADIOGRAPHIC STUDIES: I have personally reviewed the radiological images as listed and agreed with the findings in the report. CT ABDOMEN PELVIS WO CONTRAST  Result Date: 02/01/2021 CLINICAL DATA:  Abdominal abscess/infection suspected Abdominal pain, acute, nonlocalized Patient reports abdominal  pain and pressure with diarrhea. Immunotherapy 624 for renal cell cancer. EXAM: CT ABDOMEN AND PELVIS WITHOUT CONTRAST TECHNIQUE: Multidetector CT imaging of the abdomen and pelvis was performed following the standard protocol without IV contrast. COMPARISON:  Most recent CT 10/12/2020 FINDINGS: Lower chest: No acute airspace disease or pleural effusion. There are coronary artery calcifications. Hepatobiliary: Cirrhotic hepatic morphology with nodular contours. No evidence of focal hepatic lesion on this unenhanced exam. Distended gallbladder with layering gallstones. No biliary dilatation. Pancreas: No ductal dilatation or inflammation. Spleen: Splenomegaly with spleen spanning 20 cm cranial caudal. No focal splenic abnormality. Adrenals/Urinary Tract: Normal adrenal glands. Left nephrectomy without abnormal soft tissue density in the nephrectomy bed. No right hydronephrosis or renal calculi. Mild right perinephric edema. No focal renal abnormality. Partially distended urinary bladder. Stomach/Bowel: The stomach is decompressed. Infraumbilical ventral abdominal wall hernia contains short segment of small bowel, however no wall thickening or proximal dilatation. No obstruction. There are occasional fluid-filled loops of small bowel in the lower pelvis. Normal appendix. Mild wall thickening of the ascending colon may represent portal colopathy or colitis. There is liquid stool in the descending and sigmoid colon. No bowel pneumatosis. Vascular/Lymphatic: Advanced aortic and branch atherosclerosis, particularly for age. No portal venous or mesenteric gas. Venous prominence at the portal splenic confluence is stable from prior exam, and not well assessed on this unenhanced exam. Scattered upper abdominal and periportal lymph nodes, not enlarged by size criteria. Reproductive: Prostate is unremarkable.  Penile prosthesis in place. Other: Small volume abdominopelvic ascites, with slight increase from prior exam. Similar  generalized mesenteric stranding. Infraumbilical ventral abdominal wall hernia just to the left of midline, series 2, image 66, contains short segment of small bowel. Musculoskeletal: Lytic lesion within T9 vertebral body has increased in size from prior exam. Tiny lytic lesion within inferior L3 vertebral bodies unchanged. No definite new osseous abnormality. IMPRESSION: 1. Mild wall thickening of the ascending colon may represent portal colopathy or colitis. Liquid stool in the descending and sigmoid colon, can be seen with diarrheal illness. 2. Small infraumbilical ventral abdominal wall hernia contains short segment of small bowel, however no wall thickening or proximal dilatation/obstruction. 3. Cirrhosis and splenomegaly. Small volume abdominopelvic ascites, with slight increase from prior exam. 4. Post left nephrectomy. 5. Lytic lesion within T9 vertebral body has minimally increased in size from prior exam. Tiny lytic lesion within inferior L3 vertebral body unchanged. 6. Advanced aortic and branch atherosclerosis. Aortic Atherosclerosis (ICD10-I70.0). Electronically  Signed   By: Keith Rake M.D.   On: 02/01/2021 18:29   CT CHEST WO CONTRAST  Result Date: 02/03/2021 CLINICAL DATA:  New cell carcinoma. LEFT nephrectomy. T9 metastasis post radiation treatment. EXAM: CT CHEST WITHOUT CONTRAST TECHNIQUE: Multidetector CT imaging of the chest was performed following the standard protocol without IV contrast. COMPARISON:  CT abdomen pelvis 02/01/2021 FINDINGS: Cardiovascular: Port in the anterior chest wall with tip in distal SVC. Coronary artery calcification and aortic atherosclerotic calcification. Mediastinum/Nodes: No axillary or supraclavicular adenopathy. No mediastinal or hilar adenopathy. No pericardial fluid. Esophagus normal. Lungs/Pleura: Several peripheral ground-glass opacities are present. Measurable nodularity. Upper Abdomen: Liver is nodular. The spleen is enlarged. There is simple fluid  attenuation fluid surrounding the margin of the liver and spleen which is similar to exam 2 days prior. Caudate lobe is enlarged. Haziness in the retroperitoneal fat adjacent to the pancreas Musculoskeletal: Lytic lesion in the T9 vertebral body is increased measuring 22 mm by 21 mm (image 103/series 2) compared to 15 mm by 13 mm on CT 12/12/2020. No new sites lytic lesions are present within the spine or ribs. IMPRESSION: 1. Increase in size of lytic lesion in the T9 vertebral body compared to CT 10/12/2020. 2. No evidence of new skeletal metastasis. 3. Peripheral ground-glass densities suggest pulmonary infection/inflammation. This is subtle mild finding. 4. Fluid within the abdomen pelvis may relate to ascites from portal hypertension. Spleen is enlarged. Morphologic changes in the liver suggests cirrhosis. Electronically Signed   By: Suzy Bouchard M.D.   On: 02/03/2021 16:07      ASSESSMENT & PLAN:  1. Renal cell carcinoma of left kidney (HCC)   2. Thrombocytopenia ([place])   3. Other neutropenia ([place])   4. Anemia due to stage 3b chronic kidney disease ([place])   5. Diarrhea, unspecified type   6. 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 02/01/21, 02/03/2021 CT was reviewed and discussed with patient Overall stable disease.  Hold immunotherapy today. See below.   # Acute onset diarrhea, ? Immunotherapy related side effects, vs infectious.  Encourage patient to submit stool sample for c diff, GI virus panel.  He has been on prednisone $RemoveBefor'40mg'uxdHllumpnFQ$  daily x 7 days and symptoms have improved.  Tapered down to prednisone 20 mg daily for 3 days followed by prednisone 10 mg daily for 3 days and then stop.  #Diabetes, advised patient to watch glucose closely.  Today's chemistry labs showed he is mildly hyperglycemic.  Asymptomatic.  Patient was given juice in the clinic.  #T9 lesion, status post palliative  radiation.  No significant symptom improvement yet. Chronic back pain, slightly worse, probably due to chronic degenerative disease.  Advised patient to apply Voltaren cream to the area.  Monitor symptoms.  #Chronic thrombocytopenia and neutropenia,  due to liver cirrhosis Counts are stable.  #Chronic kidney disease, creatinine has improved compared to last visit. Encourage oral hydration and avoid nephrotoxin. At risk of developing dehydration due to ongoing diarrhea.  Patient will receive 1 L of normal saline for hydration today.  #Anemia, chronic, hemoglobin is 9.8 stable. monitor  Follow-up in 1 week lab NP+ IVF 3 weeks lab MD Nivlumab  All questions were answered. The patient knows to call the clinic with any problems questions or concerns.   Earlie Server, MD, PhD Hematology Oncology [facility] at [facility] Pager- [phone] 02/11/2021

## 2021-02-11 NOTE — Progress Notes (Signed)
Patient reports his diarrhea is less often but still a watery consistency.  His Glucose and bp have been elevated due to taking the steroid which his last dose is today.

## 2021-02-11 NOTE — Patient Instructions (Signed)
[facility] ONCOLOGY  Discharge Instructions: Thank you for choosing [facility] to provide your oncology and hematology care.  If you have a lab appointment with the [facility], please go directly to the [facility] and check in at the registration area.  Wear comfortable clothing and clothing appropriate for easy access to any Portacath or PICC line.   We strive to give you quality time with your provider. You may need to reschedule your appointment if you arrive late (15 or more minutes).  Arriving late affects you and other patients whose appointments are after yours.  Also, if you miss three or more appointments without notifying the office, you may be dismissed from the clinic at the provider's discretion.      For prescription refill requests, have your pharmacy contact our office and allow 72 hours for refills to be completed.    Today you received IV fluids, Normal Saline   To help prevent nausea and vomiting after your treatment, we encourage you to take your nausea medication as directed.  BELOW ARE SYMPTOMS THAT SHOULD BE REPORTED IMMEDIATELY: *FEVER GREATER THAN 100.4 F (38 C) OR HIGHER *CHILLS OR SWEATING *NAUSEA AND VOMITING THAT IS NOT CONTROLLED WITH YOUR NAUSEA MEDICATION *UNUSUAL SHORTNESS OF BREATH *UNUSUAL BRUISING OR BLEEDING *URINARY PROBLEMS (pain or burning when urinating, or frequent urination) *BOWEL PROBLEMS (unusual diarrhea, constipation, pain near the anus) TENDERNESS IN MOUTH AND THROAT WITH OR WITHOUT PRESENCE OF ULCERS (sore throat, sores in mouth, or a toothache) UNUSUAL RASH, SWELLING OR PAIN  UNUSUAL VAGINAL DISCHARGE OR ITCHING   Items with * indicate a potential emergency and should be followed up as soon as possible or go to the Emergency Department if any problems should occur.  Please show the CHEMOTHERAPY ALERT CARD or IMMUNOTHERAPY ALERT CARD at check-in to the Emergency Department and triage  nurse.  Should you have questions after your visit or need to cancel or reschedule your appointment, please contact [facility]  [phone] and follow the prompts.  Office hours are 8:00 a.m. to 4:30 p.m. Monday - Friday. Please note that voicemails left after 4:00 p.m. may not be returned until the following business day.  We are closed weekends and major holidays. You have access to a nurse at all times for urgent questions. Please call the main number to the clinic [phone] and follow the prompts.  For any non-urgent questions, you may also contact your provider using MyChart. We now offer e-Visits for anyone 73 and older to request care online for non-urgent symptoms. For details visit [email or web address].   Also download the MyChart app! Go to the app store, search "MyChart", open the app, select [facility], and log in with your MyChart username and password.  Due to Covid, a mask is required upon entering the hospital/clinic. If you do not have a mask, one will be given to you upon arrival. For doctor visits, patients may have 1 support person aged 4 or older with them. For treatment visits, patients cannot have anyone with them due to current Covid guidelines and our immunocompromised population.

## 2021-02-14 ENCOUNTER — Other Ambulatory Visit: Payer: Self-pay

## 2021-02-14 DIAGNOSIS — R197 Diarrhea, unspecified: Secondary | ICD-10-CM

## 2021-02-14 DIAGNOSIS — C642 Malignant neoplasm of left kidney, except renal pelvis: Secondary | ICD-10-CM | POA: Diagnosis not present

## 2021-02-14 LAB — GASTROINTESTINAL PANEL BY PCR, STOOL (REPLACES STOOL CULTURE)

## 2021-02-14 LAB — C DIFFICILE QUICK SCREEN W PCR REFLEX
C Diff antigen: NEGATIVE
C Diff interpretation: NOT DETECTED
C Diff toxin: NEGATIVE

## 2021-02-18 ENCOUNTER — Inpatient Hospital Stay: Payer: Medicare Other

## 2021-02-18 ENCOUNTER — Inpatient Hospital Stay (HOSPITAL_BASED_OUTPATIENT_CLINIC_OR_DEPARTMENT_OTHER): Payer: Medicare Other | Admitting: Hospice and Palliative Medicine

## 2021-02-18 ENCOUNTER — Encounter: Payer: Self-pay | Admitting: Hospice and Palliative Medicine

## 2021-02-18 VITALS — BP 139/68 | HR 73 | Temp 97.2°F | Resp 18 | Wt 251.6 lb

## 2021-02-18 DIAGNOSIS — N1832 Chronic kidney disease, stage 3b: Secondary | ICD-10-CM

## 2021-02-18 DIAGNOSIS — R197 Diarrhea, unspecified: Secondary | ICD-10-CM | POA: Diagnosis not present

## 2021-02-18 DIAGNOSIS — R188 Other ascites: Secondary | ICD-10-CM

## 2021-02-18 DIAGNOSIS — K7460 Unspecified cirrhosis of liver: Secondary | ICD-10-CM

## 2021-02-18 DIAGNOSIS — C642 Malignant neoplasm of left kidney, except renal pelvis: Secondary | ICD-10-CM | POA: Diagnosis not present

## 2021-02-18 LAB — CBC WITH DIFFERENTIAL/PLATELET
Abs Immature Granulocytes: 0.04 10*3/uL (ref 0.00–0.07)
Basophils Absolute: 0 10*3/uL (ref 0.0–0.1)
Basophils Relative: 0 %
Eosinophils Absolute: 0.1 10*3/uL (ref 0.0–0.5)
Eosinophils Relative: 2 %
HCT: 31.2 % — ABNORMAL LOW (ref 39.0–52.0)
Hemoglobin: 10.2 g/dL — ABNORMAL LOW (ref 13.0–17.0)
Immature Granulocytes: 1 %
Lymphocytes Relative: 12 %
Lymphs Abs: 0.3 10*3/uL — ABNORMAL LOW (ref 0.7–4.0)
MCH: 31.7 pg (ref 26.0–34.0)
MCHC: 32.7 g/dL (ref 30.0–36.0)
MCV: 96.9 fL (ref 80.0–100.0)
Monocytes Absolute: 0.3 10*3/uL (ref 0.1–1.0)
Monocytes Relative: 10 %
Neutro Abs: 2.1 10*3/uL (ref 1.7–7.7)
Neutrophils Relative %: 75 %
Platelets: 60 10*3/uL — ABNORMAL LOW (ref 150–400)
RBC: 3.22 MIL/uL — ABNORMAL LOW (ref 4.22–5.81)
RDW: 14.8 % (ref 11.5–15.5)
WBC: 2.8 10*3/uL — ABNORMAL LOW (ref 4.0–10.5)
nRBC: 0 % (ref 0.0–0.2)

## 2021-02-18 LAB — COMPREHENSIVE METABOLIC PANEL
ALT: 147 U/L — ABNORMAL HIGH (ref 0–44)
AST: 130 U/L — ABNORMAL HIGH (ref 15–41)
Albumin: 3.5 g/dL (ref 3.5–5.0)
Alkaline Phosphatase: 116 U/L (ref 38–126)
Anion gap: 7 (ref 5–15)
BUN: 45 mg/dL — ABNORMAL HIGH (ref 6–20)
CO2: 20 mmol/L — ABNORMAL LOW (ref 22–32)
Calcium: 8.7 mg/dL — ABNORMAL LOW (ref 8.9–10.3)
Chloride: 110 mmol/L (ref 98–111)
Creatinine, Ser: 1.56 mg/dL — ABNORMAL HIGH (ref 0.61–1.24)
GFR, Estimated: 51 mL/min — ABNORMAL LOW (ref >60)
Glucose, Bld: 134 mg/dL — ABNORMAL HIGH (ref 70–99)
Potassium: 4.1 mmol/L (ref 3.5–5.1)
Sodium: 137 mmol/L (ref 135–145)
Total Bilirubin: 0.8 mg/dL (ref 0.3–1.2)
Total Protein: 6.9 g/dL (ref 6.5–8.1)

## 2021-02-18 MED ORDER — SODIUM CHLORIDE 0.9% FLUSH
10.0000 mL | Freq: Once | INTRAVENOUS | Status: AC | PRN
  Administered 2021-02-18: 10 mL
  Filled 2021-02-18: qty 10

## 2021-02-18 MED ORDER — HEPARIN SOD (PORK) LOCK FLUSH 100 UNIT/ML IV SOLN
500.0000 [IU] | Freq: Once | INTRAVENOUS | Status: AC | PRN
Start: 2021-02-18 — End: 2021-02-18
  Administered 2021-02-18: 500 [IU]
  Filled 2021-02-18: qty 5

## 2021-02-18 MED ORDER — SODIUM CHLORIDE 0.9 % IV SOLN
Freq: Once | INTRAVENOUS | Status: AC
  Filled 2021-02-18: qty 250

## 2021-02-18 NOTE — Progress Notes (Signed)
Symptom Management [facility]  Telephone:([phone] [phone]  Patient Care Team: Albina Billet, MD as PCP - General (Internal Medicine) Earlie Server, MD as Consulting Physician (Hematology and Oncology)   Name of the patient: Henry Moore  [id number]  Apr 28, 1961   Date of visit: 02/18/21  Reason for Consult:  Henry Moore is a 60 y.o. 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  However, immunotherapy was recently held due to persistent diarrhea with concern the patient was having colitis.  He was started on prednisone taper with some improvement in symptoms.  CT of the abdomen and pelvis on 02/01/2021 revealed mild wall thickening of the ascending colon representing possible colopathy or colitis.  He was also noted to have cirrhosis and splenomegaly with small volume ascites and possible portal hypertension.  CT of the chest on 02/03/2021 revealed increased size and lytic lesion of T9 vertebral body.  Patient was seen in [facility] today for labs and IV fluids.  Today, patient reports that he is overall feeling improved with less diarrhea but he feels "abdominal bloating."  He denies pain, fever, chills.  Diarrhea is now intermittent and patient attributes to food.  He says that if he eats meat and will result in a solid stool.  If he eats fruit, he has watery diarrhea.  Denies any neurologic complaints. Denies recent fevers or illnesses. Denies any easy bleeding or bruising. Reports good appetite and denies weight loss. Denies chest pain. Denies any nausea, vomiting, constipation,. Denies urinary complaints. Patient offers no further specific complaints today.    PAST MEDICAL HISTORY: Past Medical History:  Diagnosis Date   Cough    SINUS DRAINAGE CAUSING COUGHING , REPORTS CLEAR MUCOUS    Diabetes mellitus without  complication ([place])    Family history of breast cancer    Family history of colon cancer    Family history of stomach cancer    HTN (hypertension)    Hyperlipemia    Left renal mass    Platelets decreased ([place])    DENIES UNSUAL BLEEDING    PONV (postoperative nausea and vomiting)    Renal cell carcinoma ([place]) 08/04/2019   WBC decreased     PAST SURGICAL HISTORY:  Past Surgical History:  Procedure Laterality Date   ANKLE SURGERY Left    ANTERIOR CERVICAL DECOMP/DISCECTOMY FUSION N/A 04/16/2014   Procedure: ANTERIOR CERVICAL DECOMPRESSION/DISCECTOMY FUSION  (ACDF C5-C7)   (2 LEVELS)     ;  Surgeon: Melina Schools, MD;  Location: [facility];  Service: Orthopedics;  Laterality: N/A;   APPENDECTOMY     BACK SURGERY     ESOPHAGOGASTRODUODENOSCOPY (EGD) WITH PROPOFOL N/A 02/06/2019   Procedure: ESOPHAGOGASTRODUODENOSCOPY (EGD) WITH PROPOFOL;  Surgeon: [name], MD;  Location[facility] ENDOSCOPY;  Service: Gastroenterology;  Laterality: N/A;   ESOPHAGOGASTRODUODENOSCOPY (EGD) WITH PROPOFOL N/A 03/04/2019   Procedure: ESOPHAGOGASTRODUODENOSCOPY (EGD) WITH PROPOFOL;  Surgeon: Lin Landsman, MD;  Location: [facility];  Service: Gastroenterology;  Laterality: N/A;   ESOPHAGOGASTRODUODENOSCOPY (EGD) WITH PROPOFOL N/A 04/22/2019   Procedure: ESOPHAGOGASTRODUODENOSCOPY (EGD) WITH PROPOFOL;  Surgeon: [name], MD;  Location: [facility] ENDOSCOPY;  Service: Gastroenterology;  Laterality: N/A;   FRACTURE SURGERY     HYDROCELE EXCISION     KNEE ARTHROSCOPY Bilateral    LAPAROSCOPIC NEPHRECTOMY, HAND ASSISTED Left 10/16/2018   Procedure: LEFT HAND ASSISTED LAPAROSCOPIC NEPHRECTOMY;  Surgeon: Lucas Mallow, MD;  Location: [facility];  Service: Urology;  Laterality: Left;   NASAL SINUS SURGERY     X 2   PENILE PROSTHESIS IMPLANT  10 YEARS AGO   PORTA CATH INSERTION N/A 11/11/2019   Procedure: PORTA CATH INSERTION;  Surgeon: Katha Cabal, MD;  Location: [facility] CV LAB;  Service: Cardiovascular;   Laterality: N/A;   SHOULDER SURGERY Left     HEMATOLOGY/ONCOLOGY HISTORY:  Oncology History  Renal cell carcinoma ([place])  07/14/2019 Cancer Staging   Staging form: Kidney, AJCC 8th Edition - Clinical stage from 07/14/2019: Stage IV (cTX, cNX, cM1) - Signed by Earlie Server, MD on 08/05/2020    08/04/2019 Initial Diagnosis   Renal cell carcinoma ([place])    08/14/2019 -  Chemotherapy    Patient is on Treatment Plan: RENAL CELL CARCINOMA NIVOLUMAB / IPILIMUMAB Q21D       10/05/2019 Genetic Testing   Negative genetic testing. No pathogenic variants identified on the [facility] Multi-Cancer Panel. VUS in [place] called  c.4922T>C (p.Leu1641Pro) identified. The report date is 10/05/2019.  The Multi-Cancer Panel offered by [facility] includes sequencing and/or deletion duplication testing of the following 85 genes: AIP, ALK, APC, ATM, AXIN2,BAP1,  BARD1, BLM, BMPR1A, BRCA1, BRCA2, BRIP1, CASR, CDC73, CDH1, CDK4, CDKN1B, CDKN1C, CDKN2A (p14ARF), CDKN2A (p16INK4a), CEBPA, CHEK2, CTNNA1, DICER1, DIS3L2, EGFR (c.2369C>T, p.Thr790Met variant only), EPCAM (Deletion/duplication testing only), FH, FLCN, GATA2, GPC3, GREM1 (Promoter region deletion/duplication testing only), HOXB13 (c.251G>A, p.Gly84Glu), HRAS, KIT, MAX, MEN1, MET, MITF (c.952G>A, p.Glu318Lys variant only), MLH1, MSH2, MSH3, MSH6, MUTYH, NBN, NF1, NF2, NTHL1, PALB2, PDGFRA, PHOX2B, PMS2, POLD1, POLE, POT1, PRKAR1A, PTCH1, PTEN, RAD50, RAD51C, RAD51D, RB1, RECQL4, RET, RNF43, RUNX1, SDHAF2, SDHA (sequence changes only), SDHB, SDHC, SDHD, SMAD4, SMARCA4, SMARCB1, SMARCE1, STK11, SUFU, TERC, TERT, TMEM127, TP53, TSC1, TSC2, VHL, WRN and WT1.      ALLERGIES:  is allergic to codeine and mucinex [guaifenesin er].  MEDICATIONS:  Current Outpatient Medications  Medication Sig Dispense Refill   ACCU-CHEK AVIVA PLUS test strip CHECK BL00D SUGAR ONCE OR TWICE DAILY. 100 each PRN   calcium carbonate (OS-CAL - DOSED IN MG OF ELEMENTAL CALCIUM) 1250 (500 Ca) MG  tablet Take 1 tablet by mouth.     carvedilol (COREG) 25 MG tablet Take 1 tablet (25 mg total) by mouth 2 (two) times daily with a meal. 180 tablet 3   Cholecalciferol (VITAMIN D3) 125 MCG (5000 UT) CAPS Take 5,000 Units by mouth daily.     diclofenac Sodium (VOLTAREN) 1 % GEL Apply 2 g topically 4 (four) times daily. 100 g 2   glipiZIDE (GLUCOTROL) 5 MG tablet Take 1 tablet (5 mg total) by mouth daily before breakfast. 90 tablet 3   insulin aspart (NOVOLOG) 100 UNIT/ML injection Inject into the skin. Inject under the skin 3 (three) times a day before meals Sliding scale, Daughters insulin, using while sugar is very out of wack from cancer treatments, temporary     insulin detemir (LEVEMIR) 100 UNIT/ML injection Inject into the skin. Inject 15 units under the skin every night at night, temporary, daughter's insulin     insulin lispro (INSULIN LISPRO) 100 UNIT/ML KwikPen Junior Inject into the skin 3 (three) times daily as needed (Above 250). Sliding scale     loperamide (IMODIUM A-D) 2 MG tablet Take 2 mg by mouth 4 (four) times daily as needed for diarrhea or loose stools.     losartan (COZAAR) 100 MG tablet Take 1 tablet by mouth daily. 90 tablet 0   lovastatin (MEVACOR) 40  MG tablet Take 1 tablet (40 mg total) by mouth at bedtime. 60 tablet 0   metFORMIN (GLUCOPHAGE-XR) 500 MG 24 hr tablet Take 500 mg by mouth 2 (two) times daily. 1 QAM, 1 QPM     ofloxacin (OCUFLOX) 0.3 % ophthalmic solution Dry eye     vitamin B-12 (CYANOCOBALAMIN) 1000 MCG tablet Take 1 tablet (1,000 mcg total) by mouth daily. 90 tablet 0   amLODipine (NORVASC) 10 MG tablet Take 10 mg by mouth daily. (Patient not taking: No sig reported)     hydrochlorothiazide (HYDRODIURIL) 25 MG tablet TAKE 1 TABLET DAILY. (Patient not taking: No sig reported) 90 tablet 0   predniSONE (DELTASONE) 10 MG tablet Take 1 tablet (10 mg total) by mouth daily with breakfast. (Patient not taking: Reported on 02/18/2021) 3 tablet 0   predniSONE  (DELTASONE) 20 MG tablet Take 1 tablet (20 mg total) by mouth daily with breakfast. (Patient not taking: Reported on 02/18/2021) 3 tablet 0   prochlorperazine (COMPAZINE) 10 MG tablet Take 1 tablet (10 mg total) by mouth every 6 (six) hours as needed for nausea or vomiting. (Patient not taking: Reported on 02/18/2021) 30 tablet 0   No current facility-administered medications for this visit.   Facility-Administered Medications Ordered in Other Visits  Medication Dose Route Frequency Provider Last Rate Last Admin   sodium chloride flush (NS) 0.9 % injection 10 mL  10 mL Intravenous PRN Earlie Server, MD   10 mL at 01/22/20 0831    VITAL SIGNS: BP 139/68   Pulse 73   Temp (!) 97.2 F (36.2 C) (Tympanic)   Resp 18   Wt 251 lb 9.6 oz (114.1 kg)   SpO2 100%   BMI 33.19 kg/m  Filed Weights   02/18/21 0942  Weight: 251 lb 9.6 oz (114.1 kg)    Estimated body mass index is 33.19 kg/m as calculated from the following:   Height as of 02/01/21: 6' 1"  (1.854 m).   Weight as of this encounter: 251 lb 9.6 oz (114.1 kg).  LABS: CBC:    Component Value Date/Time   WBC 2.8 (L) 02/18/2021 0930   HGB 10.2 (L) 02/18/2021 0930   HGB 10.6 (L) 05/01/2019 1413   HCT 31.2 (L) 02/18/2021 0930   HCT 31.7 (L) 05/01/2019 1413   PLT 60 (L) 02/18/2021 0930   PLT 71 (LL) 05/01/2019 1413   MCV 96.9 02/18/2021 0930   MCV 93 05/01/2019 1413   NEUTROABS 2.1 02/18/2021 0930   NEUTROABS 1.8 10/15/2018 1325   LYMPHSABS 0.3 (L) 02/18/2021 0930   LYMPHSABS 0.5 (L) 10/15/2018 1325   MONOABS 0.3 02/18/2021 0930   EOSABS 0.1 02/18/2021 0930   EOSABS 0.2 10/15/2018 1325   BASOSABS 0.0 02/18/2021 0930   BASOSABS 0.0 10/15/2018 1325   Comprehensive Metabolic Panel:    Component Value Date/Time   NA 137 02/18/2021 0930   NA 138 05/01/2019 1413   K 4.1 02/18/2021 0930   CL 110 02/18/2021 0930   CO2 20 (L) 02/18/2021 0930   BUN 45 (H) 02/18/2021 0930   BUN 28 (H) 05/01/2019 1413   CREATININE 1.56 (H) 02/18/2021  0930   GLUCOSE 134 (H) 02/18/2021 0930   CALCIUM 8.7 (L) 02/18/2021 0930   CALCIUM 8.9 05/12/2019 1121   AST 130 (H) 02/18/2021 0930   AST 41 (H) 06/18/2018 1111   ALT 147 (H) 02/18/2021 0930   ALT 33 06/18/2018 1111   ALKPHOS 116 02/18/2021 0930   BILITOT 0.8 02/18/2021 0930  BILITOT 0.7 05/01/2019 1413   PROT 6.9 02/18/2021 0930   PROT 7.8 05/01/2019 1413   ALBUMIN 3.5 02/18/2021 0930   ALBUMIN 4.4 05/01/2019 1413    RADIOGRAPHIC STUDIES: CT ABDOMEN PELVIS WO CONTRAST  Result Date: 02/01/2021 CLINICAL DATA:  Abdominal abscess/infection suspected Abdominal pain, acute, nonlocalized Patient reports abdominal pain and pressure with diarrhea. Immunotherapy 624 for renal cell cancer. EXAM: CT ABDOMEN AND PELVIS WITHOUT CONTRAST TECHNIQUE: Multidetector CT imaging of the abdomen and pelvis was performed following the standard protocol without IV contrast. COMPARISON:  Most recent CT 10/12/2020 FINDINGS: Lower chest: No acute airspace disease or pleural effusion. There are coronary artery calcifications. Hepatobiliary: Cirrhotic hepatic morphology with nodular contours. No evidence of focal hepatic lesion on this unenhanced exam. Distended gallbladder with layering gallstones. No biliary dilatation. Pancreas: No ductal dilatation or inflammation. Spleen: Splenomegaly with spleen spanning 20 cm cranial caudal. No focal splenic abnormality. Adrenals/Urinary Tract: Normal adrenal glands. Left nephrectomy without abnormal soft tissue density in the nephrectomy bed. No right hydronephrosis or renal calculi. Mild right perinephric edema. No focal renal abnormality. Partially distended urinary bladder. Stomach/Bowel: The stomach is decompressed. Infraumbilical ventral abdominal wall hernia contains short segment of small bowel, however no wall thickening or proximal dilatation. No obstruction. There are occasional fluid-filled loops of small bowel in the lower pelvis. Normal appendix. Mild wall thickening of  the ascending colon may represent portal colopathy or colitis. There is liquid stool in the descending and sigmoid colon. No bowel pneumatosis. Vascular/Lymphatic: Advanced aortic and branch atherosclerosis, particularly for age. No portal venous or mesenteric gas. Venous prominence at the portal splenic confluence is stable from prior exam, and not well assessed on this unenhanced exam. Scattered upper abdominal and periportal lymph nodes, not enlarged by size criteria. Reproductive: Prostate is unremarkable.  Penile prosthesis in place. Other: Small volume abdominopelvic ascites, with slight increase from prior exam. Similar generalized mesenteric stranding. Infraumbilical ventral abdominal wall hernia just to the left of midline, series 2, image 66, contains short segment of small bowel. Musculoskeletal: Lytic lesion within T9 vertebral body has increased in size from prior exam. Tiny lytic lesion within inferior L3 vertebral bodies unchanged. No definite new osseous abnormality. IMPRESSION: 1. Mild wall thickening of the ascending colon may represent portal colopathy or colitis. Liquid stool in the descending and sigmoid colon, can be seen with diarrheal illness. 2. Small infraumbilical ventral abdominal wall hernia contains short segment of small bowel, however no wall thickening or proximal dilatation/obstruction. 3. Cirrhosis and splenomegaly. Small volume abdominopelvic ascites, with slight increase from prior exam. 4. Post left nephrectomy. 5. Lytic lesion within T9 vertebral body has minimally increased in size from prior exam. Tiny lytic lesion within inferior L3 vertebral body unchanged. 6. Advanced aortic and branch atherosclerosis. Aortic Atherosclerosis (ICD10-I70.0). Electronically Signed   By: Keith Rake M.D.   On: 02/01/2021 18:29   CT CHEST WO CONTRAST  Result Date: 02/03/2021 CLINICAL DATA:  New cell carcinoma. LEFT nephrectomy. T9 metastasis post radiation treatment. EXAM: CT CHEST  WITHOUT CONTRAST TECHNIQUE: Multidetector CT imaging of the chest was performed following the standard protocol without IV contrast. COMPARISON:  CT abdomen pelvis 02/01/2021 FINDINGS: Cardiovascular: Port in the anterior chest wall with tip in distal SVC. Coronary artery calcification and aortic atherosclerotic calcification. Mediastinum/Nodes: No axillary or supraclavicular adenopathy. No mediastinal or hilar adenopathy. No pericardial fluid. Esophagus normal. Lungs/Pleura: Several peripheral ground-glass opacities are present. Measurable nodularity. Upper Abdomen: Liver is nodular. The spleen is enlarged. There is simple fluid attenuation  fluid surrounding the margin of the liver and spleen which is similar to exam 2 days prior. Caudate lobe is enlarged. Haziness in the retroperitoneal fat adjacent to the pancreas Musculoskeletal: Lytic lesion in the T9 vertebral body is increased measuring 22 mm by 21 mm (image 103/series 2) compared to 15 mm by 13 mm on CT 12/12/2020. No new sites lytic lesions are present within the spine or ribs. IMPRESSION: 1. Increase in size of lytic lesion in the T9 vertebral body compared to CT 10/12/2020. 2. No evidence of new skeletal metastasis. 3. Peripheral ground-glass densities suggest pulmonary infection/inflammation. This is subtle mild finding. 4. Fluid within the abdomen pelvis may relate to ascites from portal hypertension. Spleen is enlarged. Morphologic changes in the liver suggests cirrhosis. Electronically Signed   By: Suzy Bouchard M.D.   On: 02/03/2021 16:07    PERFORMANCE STATUS (ECOG) : 1 - Symptomatic but completely ambulatory  Review of Systems Unless otherwise noted, a complete review of systems is negative.  Physical Exam General: NAD Cardiovascular: regular rate and rhythm Pulmonary: clear ant fields Abdomen: soft, nontender, + bowel sounds GU: no suprapubic tenderness Extremities: no edema, no joint deformities Skin: no rashes Neurological:  Weakness but otherwise nonfocal  Assessment and Plan- Patient is a 60 y.o. 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  Diarrhea -patient reports that this has significantly improved in the quantity and frequency.  We will give him IV fluids today.  Negative stool culture/C. difficile.  Can take antidiarrheals if needed.  Patient will need visit with Dr. Tasia Catchings to discuss future treatment options for RCC.  Transaminitis -transaminases have been slowly climbing over the past couple of weeks.  Recent CT revealed cirrhosis and splenomegaly with possible portal hypertension.  Discussed with Dr. Tasia Catchings and will send patient for complete abdominal ultrasound.   Case and plan discussed with Dr. Tasia Catchings   Patient expressed understanding and was in agreement with this plan. He also understands that He can call clinic at any time with any questions, concerns, or complaints.   Thank you for allowing me to participate in the care of this very pleasant patient.   Time Total: 25 minutes  Visit consisted of counseling and education dealing with the complex and emotionally intense issues of symptom management and palliative care in the setting of serious and potentially life-threatening illness.Greater than 50%  of this time was spent counseling and coordinating care related to the above assessment and plan.  Signed by: Altha Harm, PhD, NP-C

## 2021-02-18 NOTE — Progress Notes (Signed)
Pt took his last dose of prednisone yesterday. He says he is going to need to go back on it. His diarrhea has slowed down. He feels bloated in his abdomen. He is having stools and passing gas. Has to work on his blood sugars due to taking the prednisone. Fatigued.

## 2021-02-18 NOTE — Progress Notes (Signed)
Received IV hydration today with 1 liter of NS. Pt has a complete abdominal U/S scheduled at 8am Monday morning to assess for ascitis. Arrival time is 7:45 am to [place] [facility]. Pt is to be NPO after midnight. Gave these instructions and appt to patient prior to discharge from clinic today. Discharged to home.

## 2021-02-21 ENCOUNTER — Ambulatory Visit
Admission: RE | Admit: 2021-02-21 | Discharge: 2021-02-21 | Disposition: A | Discharge status: Home / Self Care | Payer: Medicare Other | Source: Ambulatory Visit | Attending: Hospice and Palliative Medicine | Admitting: Hospice and Palliative Medicine

## 2021-02-21 ENCOUNTER — Other Ambulatory Visit: Payer: Self-pay

## 2021-02-21 DIAGNOSIS — R188 Other ascites: Secondary | ICD-10-CM | POA: Insufficient documentation

## 2021-02-21 DIAGNOSIS — C642 Malignant neoplasm of left kidney, except renal pelvis: Secondary | ICD-10-CM | POA: Insufficient documentation

## 2021-02-21 DIAGNOSIS — K7460 Unspecified cirrhosis of liver: Secondary | ICD-10-CM | POA: Insufficient documentation

## 2021-02-28 ENCOUNTER — Encounter: Payer: Self-pay | Admitting: Oncology

## 2021-03-03 ENCOUNTER — Encounter: Payer: Self-pay | Admitting: Oncology

## 2021-03-04 ENCOUNTER — Encounter: Payer: Self-pay | Admitting: Oncology

## 2021-03-04 ENCOUNTER — Inpatient Hospital Stay: Payer: Medicare Other | Attending: Oncology

## 2021-03-04 ENCOUNTER — Inpatient Hospital Stay (HOSPITAL_BASED_OUTPATIENT_CLINIC_OR_DEPARTMENT_OTHER): Payer: Medicare Other | Admitting: Oncology

## 2021-03-04 ENCOUNTER — Inpatient Hospital Stay: Payer: Medicare Other

## 2021-03-04 VITALS — BP 144/68 | HR 78 | Temp 97.0°F | Resp 18 | Wt 252.6 lb

## 2021-03-04 DIAGNOSIS — I700 Atherosclerosis of aorta: Secondary | ICD-10-CM | POA: Insufficient documentation

## 2021-03-04 DIAGNOSIS — D708 Other neutropenia: Secondary | ICD-10-CM

## 2021-03-04 DIAGNOSIS — E785 Hyperlipidemia, unspecified: Secondary | ICD-10-CM | POA: Diagnosis not present

## 2021-03-04 DIAGNOSIS — K521 Toxic gastroenteritis and colitis: Secondary | ICD-10-CM | POA: Diagnosis not present

## 2021-03-04 DIAGNOSIS — Z79899 Other long term (current) drug therapy: Secondary | ICD-10-CM | POA: Insufficient documentation

## 2021-03-04 DIAGNOSIS — K439 Ventral hernia without obstruction or gangrene: Secondary | ICD-10-CM | POA: Diagnosis not present

## 2021-03-04 DIAGNOSIS — C642 Malignant neoplasm of left kidney, except renal pelvis: Secondary | ICD-10-CM | POA: Insufficient documentation

## 2021-03-04 DIAGNOSIS — I2510 Atherosclerotic heart disease of native coronary artery without angina pectoris: Secondary | ICD-10-CM | POA: Insufficient documentation

## 2021-03-04 DIAGNOSIS — D631 Anemia in chronic kidney disease: Secondary | ICD-10-CM

## 2021-03-04 DIAGNOSIS — R188 Other ascites: Secondary | ICD-10-CM | POA: Diagnosis not present

## 2021-03-04 DIAGNOSIS — D696 Thrombocytopenia, unspecified: Secondary | ICD-10-CM

## 2021-03-04 DIAGNOSIS — K7460 Unspecified cirrhosis of liver: Secondary | ICD-10-CM | POA: Insufficient documentation

## 2021-03-04 DIAGNOSIS — G8929 Other chronic pain: Secondary | ICD-10-CM | POA: Insufficient documentation

## 2021-03-04 DIAGNOSIS — Z803 Family history of malignant neoplasm of breast: Secondary | ICD-10-CM | POA: Insufficient documentation

## 2021-03-04 DIAGNOSIS — D709 Neutropenia, unspecified: Secondary | ICD-10-CM | POA: Insufficient documentation

## 2021-03-04 DIAGNOSIS — K8020 Calculus of gallbladder without cholecystitis without obstruction: Secondary | ICD-10-CM | POA: Diagnosis not present

## 2021-03-04 DIAGNOSIS — R197 Diarrhea, unspecified: Secondary | ICD-10-CM | POA: Diagnosis not present

## 2021-03-04 DIAGNOSIS — R161 Splenomegaly, not elsewhere classified: Secondary | ICD-10-CM | POA: Diagnosis not present

## 2021-03-04 DIAGNOSIS — N1832 Chronic kidney disease, stage 3b: Secondary | ICD-10-CM

## 2021-03-04 DIAGNOSIS — Z794 Long term (current) use of insulin: Secondary | ICD-10-CM | POA: Insufficient documentation

## 2021-03-04 DIAGNOSIS — Z9221 Personal history of antineoplastic chemotherapy: Secondary | ICD-10-CM | POA: Insufficient documentation

## 2021-03-04 DIAGNOSIS — E1122 Type 2 diabetes mellitus with diabetic chronic kidney disease: Secondary | ICD-10-CM | POA: Insufficient documentation

## 2021-03-04 DIAGNOSIS — C7800 Secondary malignant neoplasm of unspecified lung: Secondary | ICD-10-CM | POA: Insufficient documentation

## 2021-03-04 DIAGNOSIS — Z8041 Family history of malignant neoplasm of ovary: Secondary | ICD-10-CM | POA: Insufficient documentation

## 2021-03-04 DIAGNOSIS — Z801 Family history of malignant neoplasm of trachea, bronchus and lung: Secondary | ICD-10-CM | POA: Insufficient documentation

## 2021-03-04 DIAGNOSIS — I129 Hypertensive chronic kidney disease with stage 1 through stage 4 chronic kidney disease, or unspecified chronic kidney disease: Secondary | ICD-10-CM | POA: Diagnosis not present

## 2021-03-04 DIAGNOSIS — J984 Other disorders of lung: Secondary | ICD-10-CM | POA: Insufficient documentation

## 2021-03-04 DIAGNOSIS — Z800 Family history of malignant neoplasm of digestive organs: Secondary | ICD-10-CM | POA: Insufficient documentation

## 2021-03-04 DIAGNOSIS — Z8616 Personal history of COVID-19: Secondary | ICD-10-CM | POA: Insufficient documentation

## 2021-03-04 DIAGNOSIS — M5136 Other intervertebral disc degeneration, lumbar region: Secondary | ICD-10-CM | POA: Diagnosis not present

## 2021-03-04 DIAGNOSIS — Z7952 Long term (current) use of systemic steroids: Secondary | ICD-10-CM | POA: Insufficient documentation

## 2021-03-04 DIAGNOSIS — M549 Dorsalgia, unspecified: Secondary | ICD-10-CM | POA: Diagnosis not present

## 2021-03-04 LAB — CBC WITH DIFFERENTIAL/PLATELET
Abs Immature Granulocytes: 0 10*3/uL (ref 0.00–0.07)
Band Neutrophils: 0 %
Basophils Absolute: 0 10*3/uL (ref 0.0–0.1)
Basophils Relative: 2 %
Blasts: 0 %
Eosinophils Absolute: 0.1 10*3/uL (ref 0.0–0.5)
Eosinophils Relative: 7 %
HCT: 28.4 % — ABNORMAL LOW (ref 39.0–52.0)
Hemoglobin: 9.4 g/dL — ABNORMAL LOW (ref 13.0–17.0)
Lymphocytes Relative: 15 %
Lymphs Abs: 0.2 10*3/uL — ABNORMAL LOW (ref 0.7–4.0)
MCH: 31.3 pg (ref 26.0–34.0)
MCHC: 33.1 g/dL (ref 30.0–36.0)
MCV: 94.7 fL (ref 80.0–100.0)
Metamyelocytes Relative: 0 %
Monocytes Absolute: 0.3 10*3/uL (ref 0.1–1.0)
Monocytes Relative: 19 %
Myelocytes: 0 %
Neutro Abs: 0.8 10*3/uL — ABNORMAL LOW (ref 1.7–7.7)
Neutrophils Relative %: 57 %
Other: 0 %
Platelets: 89 10*3/uL — ABNORMAL LOW (ref 150–400)
Promyelocytes Relative: 0 %
RBC: 3 MIL/uL — ABNORMAL LOW (ref 4.22–5.81)
RDW: 13.9 % (ref 11.5–15.5)
Smear Review: DECREASED
WBC: 1.4 10*3/uL — CL (ref 4.0–10.5)
nRBC: 0 % (ref 0.0–0.2)
nRBC: 0 /100 WBC

## 2021-03-04 LAB — COMPREHENSIVE METABOLIC PANEL
ALT: 21 U/L (ref 0–44)
AST: 28 U/L (ref 15–41)
Albumin: 3.3 g/dL — ABNORMAL LOW (ref 3.5–5.0)
Alkaline Phosphatase: 124 U/L (ref 38–126)
Anion gap: 6 (ref 5–15)
BUN: 22 mg/dL — ABNORMAL HIGH (ref 6–20)
CO2: 22 mmol/L (ref 22–32)
Calcium: 8.3 mg/dL — ABNORMAL LOW (ref 8.9–10.3)
Chloride: 108 mmol/L (ref 98–111)
Creatinine, Ser: 1.83 mg/dL — ABNORMAL HIGH (ref 0.61–1.24)
GFR, Estimated: 43 mL/min — ABNORMAL LOW (ref >60)
Glucose, Bld: 125 mg/dL — ABNORMAL HIGH (ref 70–99)
Potassium: 4.2 mmol/L (ref 3.5–5.1)
Sodium: 136 mmol/L (ref 135–145)
Total Bilirubin: 0.9 mg/dL (ref 0.3–1.2)
Total Protein: 7 g/dL (ref 6.5–8.1)

## 2021-03-04 MED ORDER — SODIUM CHLORIDE 0.9% FLUSH
10.0000 mL | Freq: Once | INTRAVENOUS | Status: DC
  Filled 2021-03-04: qty 10

## 2021-03-04 MED ORDER — HEPARIN SOD (PORK) LOCK FLUSH 100 UNIT/ML IV SOLN
500.0000 [IU] | Freq: Once | INTRAVENOUS | Status: AC
  Administered 2021-03-04: 500 [IU] via INTRAVENOUS
  Filled 2021-03-04: qty 5

## 2021-03-04 NOTE — Progress Notes (Signed)
Hematology/Oncology follow-up [facility] [phone] [phone]   Patient Care Team: Albina Billet, MD as PCP - General (Internal Medicine) Earlie Server, MD as Consulting Physician (Hematology and Oncology)  REFERRING PROVIDER: Albina Billet, MD  CHIEF COMPLAINTS/REASON FOR VISIT:  Follow-up for kidney cancer treatments  HISTORY OF PRESENTING ILLNESS:   Henry Moore is a  60 y.o.  male with PMH listed below was seen in consultation at the request of  Albina Billet, MD  for evaluation of kidney cancer Extensive medical records were reviewed Patient had MRI lumbar spine without contrast done on 07/13/2018 which showed mired lumbar degenerative disease. CT thoracic spine was done on 08/05/2018 which showed 5 cm left renal mass. 09/04/2018 CT abdomen and pelvis with and without contrast showed 5 cm round enhancing solid mass in the left kidney.  No involvement of left renal vein.  No lymphadenopathy Morphologic changes consistent with cirrhosis and the portal hypertension.  Small volume ascites and splenomegaly. . 10/16/2018 Left nephrectomy showed renal cell carcinoma, clear-cell type, nuclear grade 4, tumor extends into the renal vein and renal sinus fat.  Urethral, vascular and or resection margins are negative for tumor pT3a Nx Mx  01/28/2019 patient had another CT chest abdomen pelvis done with contrast Showed interval left nephrectomy.  Scattered tiny pulmonary nodules bilaterally.  Nonspecific.  No other evidence of metastatic disease.  Cirrhosis changes extensive coronary and aortic atherosclerosis  07/14/2019 patient underwent surveillance CT chest abdomen pelvis without contrast Interval progression of pulmonary metastasis with new enlarged right paratracheal lymph node 1.7 cm, previously 0.6 cm one-point since worrisome for metastatic adenopathy.  Multiple progressive pulmonary nodules, index nodule within the lingula measures 1.3 cm, previously  3 mm. Index subpleural nodule within the anterior right upper lobe measuring 1.8 cm, previously 3 cm Index nodule within the post lateral right lower lobe measuring 5 mm, new from previous care Aortic sclerosis.  Three-vessel coronary artery calcification noted.  Cirrhosis changes.  Splenomegaly.  Patient was referred to establish care with medical oncology for further discussion and evaluation of metastatic renal cell carcinoma. Patient reports feeling well at baseline today.  Denies any shortness of breath, cough, hemoptysis, back pain, headache. He quitted smoking 2015.  Not currently actively drinking alcohol as well. Patient has chronic back pain unchanged.  #08/14/2019-10/16/2019 nivolumab and ipilimumab. #11/06/2019-nivolumab every 2 weeks maintenance.  # Liver cirrhosis with small amount of ascites/splenomegaly patient sees gastroenterology Dr. [name].  . He will get ultrasound surveillance due in February 2021 for screening of [name].  EGD surveillance for Barrett's plan in April 2021.  # Diabetes, on Humalog sliding scale and metformin.  #Family history of cancer and personal history of RCC.  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  Status post ipilimumab and nivolumab treatment.  # 10/29/2019 CT chest abdomen pelvis without contrast images were independently reviewed by me and discussed with patient.   CT showed marked improvement with resolution of many of the pulmonary nodules, prominent reduced size of other nodules.  Considerably reduced size of the right lower paratracheal lymph node now 0.9 cm in diameter. Hepatic cirrhosis with prominent splenomegaly and trace perisplenic ascites. Chronic CT findings includes aortic atherosclerotic vascular  disease.  Mild sigmoid colon diverticulosis  # CKD, he  establish care with Dr. Candiss Norse for chronic kidney disease.  Blood pressure medication has been adjusted.  Hydrochlorothiazide decreased to 12.5 mg daily.  # NGS -foundation one PD-L1 TPS 0% no reportable mutation, MS stable. TMB 8 muts/mb VHL [id number]*16, SETD2 BAP Genetic testing is negative.   # 07/16/2020, CT chest abdomen pelvis images were reviewed and discussed with patient Compared to July 2021 image, he has had a new patchy groundglass opacities throughout both lungs with central part of solids/airspace components.  Findings are nonspecific and could be secondary to an atypical infection, including viral pneumonia or inflammation.  Patient is asymptomatic.  I wonder the changes are secondary to his Covid infection.  Patient has been started on Levaquin to cover atypical infection by on-call physician and I recommend him to continue and complete the course. Proceed with today's immunotherapy nivolumab.  Advised patient to notify me if his symptoms get worse.  # 10/12/2020 CT chest abdomen pelvis with out contrast. New 15 mm lytic lesion in the right T9 vertebral body.  Suspicious for isolated lytic metastasis.  No findings of suspicious soft tissue metastasis in the chest abdomen or pelvis.  Residual postinfectious inflammatory scarring in the lung bilaterally.  Cirrhosis.  Splenomegaly.  Minimal ascites.  # 10/26/20 Bone scan showed Minimal increased activity noted at the right costovertebral junction at the T9 level is most likely degenerative. Similar finding noted on prior bone scan of 08/11/2019. No definite T9 vertebral body abnormal activity corresponding to lytic lesion noted on recent CT. Again the recently identified T9 vertebral body lytic lesion is suspicious and further evaluation of the thoracic spine with MRI can be obtained. No other bony abnormalities noted by bone scan. I discussed patient's case on tumor board 11/04/2020.  Bone  scan probably is not sensitive in detecting lytic lesion from [address]  Consensus reached upon clinically based on the CT scan, new bone lesion is highly likely metastasis from [place]  Options including proceed with bone lesion biopsy versus referral to radiation oncology for empiric palliative radiation.  Patient has other comorbidities including cirrhosis/pancytopenia, patient opts to proceed with empiric palliative radiation.  12/13/2020 finished RT to T9 June 2022 abscess of anal area , s/ drainage and antibiotics.   02/01/2021  mild wall thickening of aseconding colon. Small infraumbilical ventral hernia,contains short segment of small bowel. Cirrohsis and splenomegaly. Small volume ascites, slight increase 02/03/21 chest wo contrast showed  Lytic lesion within T9 slightly increased in size., no new skeletal metastasis. Mild peripheral ground glass densities.    INTERVAL HISTORY Henry MATSUOis a 60y.o. male who has above history reviewed by me today presents for follow up visit for management of stage IV RCC  Diarrhea symptoms appear to be better, on and off.  He has not had diarrhea symptoms for the past few days.  Chronic back pain, unchanged.  Review of Systems  Constitutional:  Positive for fatigue. Negative for appetite change, chills, fever and unexpected weight change.  HENT:   Negative for hearing loss and voice change.   Eyes:  Negative for eye problems and icterus.  Respiratory:  Negative for chest tightness, cough and shortness of breath.   Cardiovascular:  Negative for chest pain and leg swelling.  Gastrointestinal:  Positive for diarrhea. Negative for abdominal distention and abdominal pain.  Endocrine: Negative for hot flashes.  Genitourinary:  Negative for difficulty urinating, dysuria and frequency.   Musculoskeletal:  Positive for arthralgias.       Chronic back pain  Skin:  Negative for itching and rash.       Hair loss  Neurological:  Negative for light-headedness and  numbness.  Hematological:  Negative for adenopathy. Does not bruise/bleed easily.  Psychiatric/Behavioral:  Negative for confusion.    MEDICAL HISTORY:  Past Medical History:  Diagnosis Date   Cough    SINUS DRAINAGE CAUSING COUGHING , REPORTS CLEAR MUCOUS    Diabetes mellitus without complication (HCC)    Family history of breast cancer    Family history of colon cancer    Family history of stomach cancer    HTN (hypertension)    Hyperlipemia    Left renal mass    Platelets decreased ([place])    DENIES UNSUAL BLEEDING    PONV (postoperative nausea and vomiting)    Renal cell carcinoma ([place]) 08/04/2019   WBC decreased     SURGICAL HISTORY: Past Surgical History:  Procedure Laterality Date   ANKLE SURGERY Left    ANTERIOR CERVICAL DECOMP/DISCECTOMY FUSION N/A 04/16/2014   Procedure: ANTERIOR CERVICAL DECOMPRESSION/DISCECTOMY FUSION  (ACDF C5-C7)   (2 LEVELS)     ;  Surgeon: Melina Schools, MD;  Location: [place];  Service: Orthopedics;  Laterality: N/A;   APPENDECTOMY     BACK SURGERY     ESOPHAGOGASTRODUODENOSCOPY (EGD) WITH PROPOFOL N/A 02/06/2019   Procedure: ESOPHAGOGASTRODUODENOSCOPY (EGD) WITH PROPOFOL;  Surgeon: [name], MD;  Location: [facility] ENDOSCOPY;  Service: Gastroenterology;  Laterality: N/A;   ESOPHAGOGASTRODUODENOSCOPY (EGD) WITH PROPOFOL N/A 03/04/2019   Procedure: ESOPHAGOGASTRODUODENOSCOPY (EGD) WITH PROPOFOL;  Surgeon: Lin Landsman, MD;  Location: [place];  Service: Gastroenterology;  Laterality: N/A;   ESOPHAGOGASTRODUODENOSCOPY (EGD) WITH PROPOFOL N/A 04/22/2019   Procedure: ESOPHAGOGASTRODUODENOSCOPY (EGD) WITH PROPOFOL;  Surgeon: [name], MD;  Location: [facility] ENDOSCOPY;  Service: Gastroenterology;  Laterality: N/A;   FRACTURE SURGERY     HYDROCELE EXCISION     KNEE ARTHROSCOPY Bilateral    LAPAROSCOPIC NEPHRECTOMY, HAND ASSISTED Left 10/16/2018   Procedure: LEFT HAND ASSISTED LAPAROSCOPIC NEPHRECTOMY;  Surgeon: Lucas Mallow, MD;  Location: [facility];  Service: Urology;  Laterality: Left;   NASAL SINUS SURGERY     X 2   PENILE PROSTHESIS IMPLANT  10 YEARS AGO   PORTA CATH INSERTION N/A 11/11/2019   Procedure: PORTA CATH INSERTION;  Surgeon: Katha Cabal, MD;  Location: [place] [facility];  Service: Cardiovascular;  Laterality: N/A;   SHOULDER SURGERY Left     SOCIAL HISTORY: Social History   Socioeconomic History   Marital status: Married    Spouse name: Not on file   Number of children: Not on file   Years of education: Not on file   Highest education level: Not on file  Occupational History   Not on file  Tobacco Use   Smoking status: Former    Packs/day: 0.25    Years: 20.00    Pack years: 5.00    Types: Cigarettes    Quit date: 2015    Years since quitting: 7.5   Smokeless tobacco: Never  Vaping Use   Vaping Use: Never used  Substance and Sexual Activity   Alcohol use: Not Currently    Alcohol/week: 0.0 standard drinks    Comment: no alchol in 1 year   Drug use: No   Sexual activity: Not on file  Other Topics Concern   Not on file  Social History Narrative   Not on file   Social Determinants of Health   Financial Resource Strain: Not on file  Food Insecurity: Not on file  Transportation Needs: Not on file  Physical Activity: Not on file  Stress: Not on file  Social Connections: Not on file  Intimate Partner Violence: Not on file    FAMILY HISTORY: Family History  Problem Relation Age of Onset   Diabetes Mother    Cancer Mother        neuroendocrine cancer   Cancer Father        neuroendocrine cancer of meninges   Cancer Maternal Grandmother        tomach dx 37   Alzheimer's disease Paternal Grandmother    Lung cancer Paternal Grandfather    Lung cancer Maternal Aunt    Colon cancer Maternal Uncle    Breast cancer Paternal Aunt        both dx 58s   Ovarian cancer Other        dx 75   Breast cancer Cousin     ALLERGIES:  is allergic to codeine and mucinex [guaifenesin  er].  MEDICATIONS:  Current Outpatient Medications  Medication Sig Dispense Refill   ACCU-CHEK AVIVA PLUS test strip CHECK BL00D SUGAR ONCE OR TWICE DAILY. 100 each PRN   calcium carbonate (OS-CAL - DOSED IN MG OF ELEMENTAL CALCIUM) 1250 (500 Ca) MG tablet Take 1 tablet by mouth.     carvedilol (COREG) 25 MG tablet Take 1 tablet (25 mg total) by mouth 2 (two) times daily with a meal. 180 tablet 3   Cholecalciferol (VITAMIN D3) 125 MCG (5000 UT) CAPS Take 5,000 Units by mouth daily.     diclofenac Sodium (VOLTAREN) 1 % GEL Apply 2 g topically 4 (four) times daily. 100 g 2   glipiZIDE (GLUCOTROL) 5 MG tablet Take 1 tablet (5 mg total) by mouth daily before breakfast. 90 tablet 3   loperamide (IMODIUM A-D) 2 MG tablet Take 2 mg by mouth 4 (four) times daily as needed for diarrhea or loose stools.     losartan (COZAAR) 100 MG tablet Take 1 tablet by mouth daily. 90 tablet 0   lovastatin (MEVACOR) 40 MG tablet Take 1 tablet (40 mg total) by mouth at bedtime. 60 tablet 0   metFORMIN (GLUCOPHAGE-XR) 500 MG 24 hr tablet Take 500 mg by mouth 2 (two) times daily. 1 QAM, 1 QPM     vitamin B-12 (CYANOCOBALAMIN) 1000 MCG tablet Take 1 tablet (1,000 mcg total) by mouth daily. 90 tablet 0   amLODipine (NORVASC) 10 MG tablet Take 10 mg by mouth daily. (Patient not taking: No sig reported)     hydrochlorothiazide (HYDRODIURIL) 25 MG tablet TAKE 1 TABLET DAILY. (Patient not taking: No sig reported) 90 tablet 0   insulin aspart (NOVOLOG) 100 UNIT/ML injection Inject into the skin. Inject under the skin 3 (three) times a day before meals Sliding scale, Daughters insulin, using while sugar is very out of wack from cancer treatments, temporary (Patient not taking: Reported on 03/04/2021)     insulin detemir (LEVEMIR) 100 UNIT/ML injection Inject into the skin. Inject 15 units under the skin every night at night, temporary, daughter's insulin (Patient not taking: Reported on 03/04/2021)     insulin lispro (INSULIN LISPRO)  100 UNIT/ML KwikPen Junior Inject into the skin 3 (three) times daily as needed (Above 250). Sliding scale (Patient not taking: Reported on 03/04/2021)     ofloxacin (OCUFLOX) 0.3 % ophthalmic solution Dry eye (Patient not taking: Reported on 03/04/2021)     predniSONE (DELTASONE) 10 MG tablet Take 1 tablet (10 mg total)  by mouth daily with breakfast. (Patient not taking: No sig reported) 3 tablet 0   predniSONE (DELTASONE) 20 MG tablet Take 1 tablet (20 mg total) by mouth daily with breakfast. (Patient not taking: No sig reported) 3 tablet 0   prochlorperazine (COMPAZINE) 10 MG tablet Take 1 tablet (10 mg total) by mouth every 6 (six) hours as needed for nausea or vomiting. (Patient not taking: No sig reported) 30 tablet 0   No current facility-administered medications for this visit.   Facility-Administered Medications Ordered in Other Visits  Medication Dose Route Frequency Provider Last Rate Last Admin   sodium chloride flush (NS) 0.9 % injection 10 mL  10 mL Intravenous PRN Earlie Server, MD   10 mL at 01/22/20 0831     PHYSICAL EXAMINATION: ECOG PERFORMANCE STATUS: 1 - Symptomatic but completely ambulatory Vitals:   03/04/21 0901  BP: (!) 144/68  Pulse: 78  Resp: 18  Temp: (!) 97 F (36.1 C)   Filed Weights   03/04/21 0901  Weight: 252 lb 9.6 oz (114.6 kg)    Physical Exam Constitutional:      General: He is not in acute distress.    Appearance: He is obese.  HENT:     Head: Normocephalic and atraumatic.  Eyes:     General: No scleral icterus. Cardiovascular:     Rate and Rhythm: Normal rate and regular rhythm.     Heart sounds: Normal heart sounds.  Pulmonary:     Effort: Pulmonary effort is normal. No respiratory distress.     Breath sounds: No wheezing.  Abdominal:     General: Bowel sounds are normal. There is no distension.     Palpations: Abdomen is soft.  Musculoskeletal:        General: No deformity. Normal range of motion.     Cervical back: Normal range of  motion and neck supple.  Skin:    General: Skin is warm and dry.     Findings: No erythema or rash.  Neurological:     Mental Status: He is alert and oriented to person, place, and time. Mental status is at baseline.     Cranial Nerves: No cranial nerve deficit.     Coordination: Coordination normal.  Psychiatric:        Mood and Affect: Mood normal.    LABORATORY DATA:  I have reviewed the data as listed Lab Results  Component Value Date   WBC 1.4 (LL) 03/04/2021   HGB 9.4 (L) 03/04/2021   HCT 28.4 (L) 03/04/2021   MCV 94.7 03/04/2021   PLT 89 (L) 03/04/2021   Recent Labs    04/01/20 0837 04/15/20 0853 04/29/20 0849 05/21/20 1253 02/11/21 0905 02/18/21 0930 03/04/21 0849  NA 139 135 138   < > 136 137 136  K 4.3 4.3 4.2   < > 3.5 4.1 4.2  CL 107 104 108   < > 109 110 108  CO2 21* 21* 23   < > 20* 20* 22  GLUCOSE 123* 162* 109*   < > 63* 134* 125*  BUN 24* 22* 20   < > 52* 45* 22*  CREATININE 1.69* 1.87* 1.58*   < > 1.72* 1.56* 1.83*  CALCIUM 8.5* 8.6* 8.7*   < > 8.8* 8.7* 8.3*  GFRNONAA 44* 39* 48*   < > 46* 51* 43*  GFRAA 51* 45* 55*  --   --   --   --   PROT 7.9 7.8 8.0   < >  7.2 6.9 7.0  ALBUMIN 4.0 4.0 4.3   < > 3.7 3.5 3.3*  AST 31 34 29   < > 55* 130* 28  ALT _0 < > 54* 147* 21  ALKPHOS 102 117 108   < > 80 116 124  BILITOT 0.9 0.8 1.5*   < > 0.6 0.8 0.9   < > = values in this interval not displayed.    Iron/TIBC/Ferritin/ %Sat    Component Value Date/Time   IRON 60 09/02/2020 0822   IRON 105 10/15/2018 1325   TIBC 293 09/02/2020 0822   TIBC 244 (L) 10/15/2018 1325   FERRITIN 164 09/02/2020 0822   FERRITIN 588 (H) 10/15/2018 1325   IRONPCTSAT 21 09/02/2020 0822   IRONPCTSAT 43 10/15/2018 1325      RADIOGRAPHIC STUDIES: I have personally reviewed the radiological images as listed and agreed with the findings in the report. CT ABDOMEN PELVIS WO CONTRAST  Result Date: 02/01/2021 CLINICAL DATA:  Abdominal abscess/infection suspected  Abdominal pain, acute, nonlocalized Patient reports abdominal pain and pressure with diarrhea. Immunotherapy 624 for renal cell cancer. EXAM: CT ABDOMEN AND PELVIS WITHOUT CONTRAST TECHNIQUE: Multidetector CT imaging of the abdomen and pelvis was performed following the standard protocol without IV contrast. COMPARISON:  Most recent CT 10/12/2020 FINDINGS: Lower chest: No acute airspace disease or pleural effusion. There are coronary artery calcifications. Hepatobiliary: Cirrhotic hepatic morphology with nodular contours. No evidence of focal hepatic lesion on this unenhanced exam. Distended gallbladder with layering gallstones. No biliary dilatation. Pancreas: No ductal dilatation or inflammation. Spleen: Splenomegaly with spleen spanning 20 cm cranial caudal. No focal splenic abnormality. Adrenals/Urinary Tract: Normal adrenal glands. Left nephrectomy without abnormal soft tissue density in the nephrectomy bed. No right hydronephrosis or renal calculi. Mild right perinephric edema. No focal renal abnormality. Partially distended urinary bladder. Stomach/Bowel: The stomach is decompressed. Infraumbilical ventral abdominal wall hernia contains short segment of small bowel, however no wall thickening or proximal dilatation. No obstruction. There are occasional fluid-filled loops of small bowel in the lower pelvis. Normal appendix. Mild wall thickening of the ascending colon may represent portal colopathy or colitis. There is liquid stool in the descending and sigmoid colon. No bowel pneumatosis. Vascular/Lymphatic: Advanced aortic and branch atherosclerosis, particularly for age. No portal venous or mesenteric gas. Venous prominence at the portal splenic confluence is stable from prior exam, and not well assessed on this unenhanced exam. Scattered upper abdominal and periportal lymph nodes, not enlarged by size criteria. Reproductive: Prostate is unremarkable.  Penile prosthesis in place. Other: Small volume  abdominopelvic ascites, with slight increase from prior exam. Similar generalized mesenteric stranding. Infraumbilical ventral abdominal wall hernia just to the left of midline, series 2, image 66, contains short segment of small bowel. Musculoskeletal: Lytic lesion within T9 vertebral body has increased in size from prior exam. Tiny lytic lesion within inferior L3 vertebral bodies unchanged. No definite new osseous abnormality. IMPRESSION: 1. Mild wall thickening of the ascending colon may represent portal colopathy or colitis. Liquid stool in the descending and sigmoid colon, can be seen with diarrheal illness. 2. Small infraumbilical ventral abdominal wall hernia contains short segment of small bowel, however no wall thickening or proximal dilatation/obstruction. 3. Cirrhosis and splenomegaly. Small volume abdominopelvic ascites, with slight increase from prior exam. 4. Post left nephrectomy. 5. Lytic lesion within T9 vertebral body has minimally increased in size from prior exam. Tiny lytic lesion within inferior L3 vertebral body unchanged. 6. Advanced aortic and branch atherosclerosis.  Aortic Atherosclerosis (ICD10-I70.0). Electronically Signed   By: Keith Rake M.D.   On: 02/01/2021 18:29   CT CHEST WO CONTRAST  Result Date: 02/03/2021 CLINICAL DATA:  New cell carcinoma. LEFT nephrectomy. T9 metastasis post radiation treatment. EXAM: CT CHEST WITHOUT CONTRAST TECHNIQUE: Multidetector CT imaging of the chest was performed following the standard protocol without IV contrast. COMPARISON:  CT abdomen pelvis 02/01/2021 FINDINGS: Cardiovascular: Port in the anterior chest wall with tip in distal SVC. Coronary artery calcification and aortic atherosclerotic calcification. Mediastinum/Nodes: No axillary or supraclavicular adenopathy. No mediastinal or hilar adenopathy. No pericardial fluid. Esophagus normal. Lungs/Pleura: Several peripheral ground-glass opacities are present. Measurable nodularity. Upper  Abdomen: Liver is nodular. The spleen is enlarged. There is simple fluid attenuation fluid surrounding the margin of the liver and spleen which is similar to exam 2 days prior. Caudate lobe is enlarged. Haziness in the retroperitoneal fat adjacent to the pancreas Musculoskeletal: Lytic lesion in the T9 vertebral body is increased measuring 22 mm by 21 mm (image 103/series 2) compared to 15 mm by 13 mm on CT 12/12/2020. No new sites lytic lesions are present within the spine or ribs. IMPRESSION: 1. Increase in size of lytic lesion in the T9 vertebral body compared to CT 10/12/2020. 2. No evidence of new skeletal metastasis. 3. Peripheral ground-glass densities suggest pulmonary infection/inflammation. This is subtle mild finding. 4. Fluid within the abdomen pelvis may relate to ascites from portal hypertension. Spleen is enlarged. Morphologic changes in the liver suggests cirrhosis. Electronically Signed   By: Suzy Bouchard M.D.   On: 02/03/2021 16:07   US Abdomen Complete  Result Date: 02/21/2021 CLINICAL DATA:  Renal cell carcinoma of left kidney. Abdominal distention. Cirrhosis EXAM: ABDOMEN ULTRASOUND COMPLETE COMPARISON:  CT 02/01/2021.  Ultrasound 03/11/2019 FINDINGS: Gallbladder: Small mobile gallstones throughout the gallbladder measuring up to 7 mm. Focal gallbladder wall thickening anteriorly measures 9 mm. Negative sonographic Murphy's. No pericholecystic fluid. Common bile duct: Diameter: Normal caliber, 3 mm Liver: Nodular contours with heterogeneous echotexture compatible with cirrhosis. No focal hepatic abnormality. Portal vein is patent on color Doppler imaging with normal direction of blood flow towards the liver. IVC: No abnormality visualized. Pancreas: Visualized portion unremarkable. Spleen: Splenomegaly with a craniocaudal length of 16 cm and volume 935 mL. Right Kidney: Length: 11.8 cm. Echogenicity within normal limits. No mass or hydronephrosis visualized. Left Kidney: Length: Prior  nephrectomy. Abdominal aorta: No aneurysm visualized. Other findings: Small amount of ascites in the right lower quadrant. IMPRESSION: Cirrhosis with associated splenomegaly and small amount of ascites. Cholelithiasis.  No convincing evidence of acute cholecystitis. Electronically Signed   By: Rolm Baptise M.D.   On: 02/21/2021 08:55      ASSESSMENT & PLAN:  1. Renal cell carcinoma of left kidney (HCC)   2. Diarrhea, unspecified type   3. Thrombocytopenia ([place])   4. Other neutropenia ([place])   5. 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 02/01/21, 02/03/2021 CT was reviewed and discussed with patient Overall stable disease.-Except slight increase of the T9 lytic lesion.-Refer back to Dr. Baruch Gouty for evaluation of need of additional Radiation Hold immunotherapy today. See below.   #Neutropenia, ANC 0.8, he is afebrile.  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 Symptoms have improved, however not completely gone. Recommend continue to hold off immunotherapy at this point.  Reevaluate in 2 weeks.  #  Diabetes, glucose is stable today.  Patient is back on metformin.  #T9 lesion, status post palliative radiation.  No significant symptom improvement yet. Chronic back pain, slightly worse, probably due to chronic degenerative disease.  Advised patient to apply Voltaren cream to the area.  See Dr. Baruch Gouty  #Chronic thrombocytopenia and neutropenia,  due to liver cirrhosis Counts are stable.  #Chronic kidney disease, creatinine has improved compared to last visit. Encourage oral hydration and avoid nephrotoxin.  #Anemia, chronic, hemoglobin is 9.4 stable. monitor  Follow-up  2 weeks lab MD Nivlumab  All questions were answered. The patient knows to call the clinic with any problems questions or concerns.   Earlie Server, MD, PhD Hematology Oncology [facility] Pager- [phone] 03/04/2021

## 2021-03-04 NOTE — Progress Notes (Signed)
Patient here for follow up. Pt reports that he continues to have diarrhea. Imodium slows diarrhea but does not stop it.

## 2021-03-10 ENCOUNTER — Telehealth: Payer: Self-pay

## 2021-03-10 NOTE — Telephone Encounter (Signed)
Left VM for pt to call back and let us know who his dentist is so we can fax dental clearance form.

## 2021-03-14 ENCOUNTER — Other Ambulatory Visit: Payer: Self-pay | Admitting: *Deleted

## 2021-03-14 ENCOUNTER — Encounter: Payer: Self-pay | Admitting: Radiation Oncology

## 2021-03-14 ENCOUNTER — Encounter: Payer: Self-pay | Admitting: Oncology

## 2021-03-14 ENCOUNTER — Ambulatory Visit
Admission: RE | Admit: 2021-03-14 | Discharge: 2021-03-14 | Disposition: A | Discharge status: Home / Self Care | Payer: BC Managed Care – PPO | Source: Ambulatory Visit | Attending: Radiation Oncology | Admitting: Radiation Oncology

## 2021-03-14 VITALS — BP 138/62 | HR 79 | Temp 96.5°F | Resp 18 | Wt 250.2 lb

## 2021-03-14 DIAGNOSIS — C7951 Secondary malignant neoplasm of bone: Secondary | ICD-10-CM | POA: Insufficient documentation

## 2021-03-14 DIAGNOSIS — C7800 Secondary malignant neoplasm of unspecified lung: Secondary | ICD-10-CM | POA: Diagnosis not present

## 2021-03-14 DIAGNOSIS — Z923 Personal history of irradiation: Secondary | ICD-10-CM | POA: Insufficient documentation

## 2021-03-14 DIAGNOSIS — C642 Malignant neoplasm of left kidney, except renal pelvis: Secondary | ICD-10-CM | POA: Insufficient documentation

## 2021-03-14 NOTE — Progress Notes (Signed)
Radiation Oncology Follow up Note  Name: Henry Moore   Date:   03/14/2021 MRN:  [id number] DOB: [birth year]    This 60 y.o. male presents to the clinic today for 57-monthfollow-up reevaluation for progressive disease in his T9 vertebral body for metastatic renal cell carcinoma.  REFERRING PROVIDER: TAlbina Billet MD  HPI: Patient is a 60year old male with stage IV renal cell carcinoma previously treated with SBRT to his T9 vertebral body for metastatic renal cell carcinoma.  He received 30 Gray in 5 fractions.  His pain has not resolved he had a recent repeat CT scan of his chest.  Showing increase in size of lytic lesion from CT scan done back in March 2022.  He has no evidence of any new skeletal metastasis.  He continues to have back pain he is having no focal neurologic complaints.  On my review his lytic lesion is encroaching on the spinal canal.  COMPLICATIONS OF TREATMENT: none  FOLLOW UP COMPLIANCE: keeps appointments   PHYSICAL EXAM:  BP 138/62 (BP Location: Left Arm, Patient Position: Sitting)   Pulse 79   Temp (!) 96.5 F (35.8 C) (Tympanic)   Resp 18   Wt 250 lb 3.2 oz (113.5 kg)   BMI 33.01 kg/m  Well-developed well-nourished patient in NAD. HEENT reveals PERLA, EOMI, discs not visualized.  Oral cavity is clear. No oral mucosal lesions are identified. Neck is clear without evidence of cervical or supraclavicular adenopathy. Lungs are clear to A&P. Cardiac examination is essentially unremarkable with regular rate and rhythm without murmur rub or thrill. Abdomen is benign with no organomegaly or masses noted. Motor sensory and DTR levels are equal and symmetric in the upper and lower extremities. Cranial nerves II through XII are grossly intact. Proprioception is intact. No peripheral adenopathy or edema is identified. No motor or sensory levels are noted. Crude visual fields are within normal range.  RADIOLOGY RESULTS: Serial CT scans are reviewed compatible with above-stated  findings  PLAN: At this time I am worried about radiation over exposure to his spinal cord.  I would suggest and have arranged for evaluation by neurosurgeon for possible decompression.  Should not be possible may consider further SBRT to his spine.  Patient is aware whole my recommendations.  Referral to neurosurgeon was made.  I would like to take this opportunity to thank you for allowing me to participate in the care of your patient..Noreene Filbert MD

## 2021-03-18 ENCOUNTER — Encounter: Payer: Self-pay | Admitting: Oncology

## 2021-03-18 ENCOUNTER — Inpatient Hospital Stay: Payer: Medicare Other

## 2021-03-18 ENCOUNTER — Inpatient Hospital Stay (HOSPITAL_BASED_OUTPATIENT_CLINIC_OR_DEPARTMENT_OTHER): Payer: Medicare Other | Admitting: Oncology

## 2021-03-18 VITALS — BP 120/66 | HR 77 | Temp 98.1°F | Wt 251.0 lb

## 2021-03-18 DIAGNOSIS — Z5112 Encounter for antineoplastic immunotherapy: Secondary | ICD-10-CM

## 2021-03-18 DIAGNOSIS — D696 Thrombocytopenia, unspecified: Secondary | ICD-10-CM

## 2021-03-18 DIAGNOSIS — N1832 Chronic kidney disease, stage 3b: Secondary | ICD-10-CM | POA: Diagnosis not present

## 2021-03-18 DIAGNOSIS — C642 Malignant neoplasm of left kidney, except renal pelvis: Secondary | ICD-10-CM

## 2021-03-18 DIAGNOSIS — D631 Anemia in chronic kidney disease: Secondary | ICD-10-CM

## 2021-03-18 DIAGNOSIS — K7460 Unspecified cirrhosis of liver: Secondary | ICD-10-CM

## 2021-03-18 DIAGNOSIS — R197 Diarrhea, unspecified: Secondary | ICD-10-CM

## 2021-03-18 DIAGNOSIS — C649 Malignant neoplasm of unspecified kidney, except renal pelvis: Secondary | ICD-10-CM

## 2021-03-18 DIAGNOSIS — R188 Other ascites: Secondary | ICD-10-CM

## 2021-03-18 LAB — CBC WITH DIFFERENTIAL/PLATELET
Abs Immature Granulocytes: 0.04 10*3/uL (ref 0.00–0.07)
Basophils Absolute: 0 10*3/uL (ref 0.0–0.1)
Basophils Relative: 1 %
Eosinophils Absolute: 0.1 10*3/uL (ref 0.0–0.5)
Eosinophils Relative: 3 %
HCT: 28.5 % — ABNORMAL LOW (ref 39.0–52.0)
Hemoglobin: 9.5 g/dL — ABNORMAL LOW (ref 13.0–17.0)
Immature Granulocytes: 2 %
Lymphocytes Relative: 12 %
Lymphs Abs: 0.3 10*3/uL — ABNORMAL LOW (ref 0.7–4.0)
MCH: 30.9 pg (ref 26.0–34.0)
MCHC: 33.3 g/dL (ref 30.0–36.0)
MCV: 92.8 fL (ref 80.0–100.0)
Monocytes Absolute: 0.3 10*3/uL (ref 0.1–1.0)
Monocytes Relative: 14 %
Neutro Abs: 1.6 10*3/uL — ABNORMAL LOW (ref 1.7–7.7)
Neutrophils Relative %: 68 %
Platelets: 76 10*3/uL — ABNORMAL LOW (ref 150–400)
RBC: 3.07 MIL/uL — ABNORMAL LOW (ref 4.22–5.81)
RDW: 13.6 % (ref 11.5–15.5)
WBC: 2.3 10*3/uL — ABNORMAL LOW (ref 4.0–10.5)
nRBC: 0 % (ref 0.0–0.2)

## 2021-03-18 LAB — COMPREHENSIVE METABOLIC PANEL
ALT: 17 U/L (ref 0–44)
AST: 31 U/L (ref 15–41)
Albumin: 3.2 g/dL — ABNORMAL LOW (ref 3.5–5.0)
Alkaline Phosphatase: 100 U/L (ref 38–126)
Anion gap: 7 (ref 5–15)
BUN: 27 mg/dL — ABNORMAL HIGH (ref 6–20)
CO2: 22 mmol/L (ref 22–32)
Calcium: 8.1 mg/dL — ABNORMAL LOW (ref 8.9–10.3)
Chloride: 106 mmol/L (ref 98–111)
Creatinine, Ser: 2 mg/dL — ABNORMAL HIGH (ref 0.61–1.24)
GFR, Estimated: 38 mL/min — ABNORMAL LOW (ref >60)
Glucose, Bld: 90 mg/dL (ref 70–99)
Potassium: 4.1 mmol/L (ref 3.5–5.1)
Sodium: 135 mmol/L (ref 135–145)
Total Bilirubin: 0.8 mg/dL (ref 0.3–1.2)
Total Protein: 6.8 g/dL (ref 6.5–8.1)

## 2021-03-18 LAB — TSH: TSH: 4.512 u[IU]/mL — ABNORMAL HIGH (ref 0.350–4.500)

## 2021-03-18 MED ORDER — SODIUM CHLORIDE 0.9 % IV SOLN
240.0000 mg | Freq: Once | INTRAVENOUS | Status: AC
  Administered 2021-03-18: 240 mg via INTRAVENOUS
  Filled 2021-03-18: qty 24

## 2021-03-18 MED ORDER — HEPARIN SOD (PORK) LOCK FLUSH 100 UNIT/ML IV SOLN
INTRAVENOUS | Status: AC
  Filled 2021-03-18: qty 5

## 2021-03-18 MED ORDER — SODIUM CHLORIDE 0.9 % IV SOLN
Freq: Once | INTRAVENOUS | Status: AC
  Filled 2021-03-18: qty 250

## 2021-03-18 MED ORDER — HEPARIN SOD (PORK) LOCK FLUSH 100 UNIT/ML IV SOLN
500.0000 [IU] | Freq: Once | INTRAVENOUS | Status: AC | PRN
  Administered 2021-03-18: 500 [IU]
  Filled 2021-03-18: qty 5

## 2021-03-18 NOTE — Patient Instructions (Signed)
[facility] [place] [facility]  Discharge Instructions: Thank you for choosing [facility] to provide your oncology and hematology care.  If you have a lab appointment with the Cancer Center, please go directly to the Cancer Center and check in at the registration area.  Wear comfortable clothing and clothing appropriate for easy access to any Portacath or PICC line.   We strive to give you quality time with your provider. You may need to reschedule your appointment if you arrive late (15 or more minutes).  Arriving late affects you and other patients whose appointments are after yours.  Also, if you miss three or more appointments without notifying the office, you may be dismissed from the clinic at the provider's discretion.      For prescription refill requests, have your pharmacy contact our office and allow 72 hours for refills to be completed.    Today you received the following chemotherapy and/or immunotherapy agents Nivolumab       To help prevent nausea and vomiting after your treatment, we encourage you to take your nausea medication as directed.  BELOW ARE SYMPTOMS THAT SHOULD BE REPORTED IMMEDIATELY: *FEVER GREATER THAN 100.4 F (38 C) OR HIGHER *CHILLS OR SWEATING *NAUSEA AND VOMITING THAT IS NOT CONTROLLED WITH YOUR NAUSEA MEDICATION *UNUSUAL SHORTNESS OF BREATH *UNUSUAL BRUISING OR BLEEDING *URINARY PROBLEMS (pain or burning when urinating, or frequent urination) *BOWEL PROBLEMS (unusual diarrhea, constipation, pain near the anus) TENDERNESS IN MOUTH AND THROAT WITH OR WITHOUT PRESENCE OF ULCERS (sore throat, sores in mouth, or a toothache) UNUSUAL RASH, SWELLING OR PAIN  UNUSUAL VAGINAL DISCHARGE OR ITCHING   Items with * indicate a potential emergency and should be followed up as soon as possible or go to the Emergency Department if any problems should occur.  Please show the CHEMOTHERAPY ALERT CARD or IMMUNOTHERAPY ALERT CARD at check-in  to the Emergency Department and triage nurse.  Should you have questions after your visit or need to cancel or reschedule your appointment, please contact [facility] [place] [facility]  [phone] and follow the prompts.  Office hours are 8:00 a.m. to 4:30 p.m. Monday - Friday. Please note that voicemails left after 4:00 p.m. may not be returned until the following business day.  We are closed weekends and major holidays. You have access to a nurse at all times for urgent questions. Please call the main number to the clinic [phone] and follow the prompts.  For any non-urgent questions, you may also contact your provider using MyChart. We now offer e-Visits for anyone 18 and older to request care online for non-urgent symptoms. For details visit [email or web address].   Also download the MyChart app! Go to the app store, search "MyChart", open the app, select [facility], and log in with your MyChart username and password.  Due to Covid, a mask is required upon entering the hospital/clinic. If you do not have a mask, one will be given to you upon arrival. For doctor visits, patients may have 1 support person aged 18 or older with them. For treatment visits, patients cannot have anyone with them due to current Covid guidelines and our immunocompromised population.  

## 2021-03-19 ENCOUNTER — Encounter: Payer: Self-pay | Admitting: Oncology

## 2021-03-19 NOTE — Progress Notes (Signed)
Hematology/Oncology follow-up [facility] [phone] [phone]   Patient Care Team: Albina Billet, MD as PCP - General (Internal Medicine) Earlie Server, MD as Consulting Physician (Hematology and Oncology)  REFERRING PROVIDER: Albina Billet, MD  CHIEF COMPLAINTS/REASON FOR VISIT:  Follow-up for kidney cancer treatments  HISTORY OF PRESENTING ILLNESS:   Henry Moore is a  60 y.o.  male with PMH listed below was seen in consultation at the request of  Albina Billet, MD  for evaluation of kidney cancer Extensive medical records were reviewed Patient had MRI lumbar spine without contrast done on 07/13/2018 which showed mired lumbar degenerative disease. CT thoracic spine was done on 08/05/2018 which showed 5 cm left renal mass. 09/04/2018 CT abdomen and pelvis with and without contrast showed 5 cm round enhancing solid mass in the left kidney.  No involvement of left renal vein.  No lymphadenopathy Morphologic changes consistent with cirrhosis and the portal hypertension.  Small volume ascites and splenomegaly. . 10/16/2018 Left nephrectomy showed renal cell carcinoma, clear-cell type, nuclear grade 4, tumor extends into the renal vein and renal sinus fat.  Urethral, vascular and or resection margins are negative for tumor pT3a Nx Mx  01/28/2019 patient had another CT chest abdomen pelvis done with contrast Showed interval left nephrectomy.  Scattered tiny pulmonary nodules bilaterally.  Nonspecific.  No other evidence of metastatic disease.  Cirrhosis changes extensive coronary and aortic atherosclerosis  07/14/2019 patient underwent surveillance CT chest abdomen pelvis without contrast Interval progression of pulmonary metastasis with new enlarged right paratracheal lymph node 1.7 cm, previously 0.6 cm one-point since worrisome for metastatic adenopathy.  Multiple progressive pulmonary nodules, index nodule within the lingula measures 1.3 cm, previously  3 mm. Index subpleural nodule within the anterior right upper lobe measuring 1.8 cm, previously 3 cm Index nodule within the post lateral right lower lobe measuring 5 mm, new from previous care Aortic sclerosis.  Three-vessel coronary artery calcification noted.  Cirrhosis changes.  Splenomegaly.  Patient was referred to establish care with medical oncology for further discussion and evaluation of metastatic renal cell carcinoma. Patient reports feeling well at baseline today.  Denies any shortness of breath, cough, hemoptysis, back pain, headache. He quitted smoking 2015.  Not currently actively drinking alcohol as well. Patient has chronic back pain unchanged.  #08/14/2019-10/16/2019 nivolumab and ipilimumab. #11/06/2019-nivolumab every 2 weeks maintenance.  # Liver cirrhosis with small amount of ascites/splenomegaly patient sees gastroenterology Dr. [name].  . He will get ultrasound surveillance due in February 2021 for screening of [name].  EGD surveillance for Barrett's plan in April 2021.  # Diabetes, on Humalog sliding scale and metformin.  #Family history of cancer and personal history of RCC.  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  Status post ipilimumab and nivolumab treatment.  # 10/29/2019 CT chest abdomen pelvis without contrast images were independently reviewed by me and discussed with patient.   CT showed marked improvement with resolution of many of the pulmonary nodules, prominent reduced size of other nodules.  Considerably reduced size of the right lower paratracheal lymph node now 0.9 cm in diameter. Hepatic cirrhosis with prominent splenomegaly and trace perisplenic ascites. Chronic CT findings includes aortic atherosclerotic vascular  disease.  Mild sigmoid colon diverticulosis  # CKD, he  establish care with Dr. Candiss Norse for chronic kidney disease.  Blood pressure medication has been adjusted.  Hydrochlorothiazide decreased to 12.5 mg daily.  # NGS -foundation one PD-L1 TPS 0% no reportable mutation, MS stable. TMB 8 muts/mb VHL [id number]*16, SETD2 BAP Genetic testing is negative.   # 07/16/2020, CT chest abdomen pelvis images were reviewed and discussed with patient Compared to July 2021 image, he has had a new patchy groundglass opacities throughout both lungs with central part of solids/airspace components.  Findings are nonspecific and could be secondary to an atypical infection, including viral pneumonia or inflammation.  Patient is asymptomatic.  I wonder the changes are secondary to his Covid infection.  Patient has been started on Levaquin to cover atypical infection by on-call physician and I recommend him to continue and complete the course. Proceed with today's immunotherapy nivolumab.  Advised patient to notify me if his symptoms get worse.  # 10/12/2020 CT chest abdomen pelvis with out contrast. New 15 mm lytic lesion in the right T9 vertebral body.  Suspicious for isolated lytic metastasis.  No findings of suspicious soft tissue metastasis in the chest abdomen or pelvis.  Residual postinfectious inflammatory scarring in the lung bilaterally.  Cirrhosis.  Splenomegaly.  Minimal ascites.  # 10/26/20 Bone scan showed Minimal increased activity noted at the right costovertebral junction at the T9 level is most likely degenerative. Similar finding noted on prior bone scan of 08/11/2019. No definite T9 vertebral body abnormal activity corresponding to lytic lesion noted on recent CT. Again the recently identified T9 vertebral body lytic lesion is suspicious and further evaluation of the thoracic spine with MRI can be obtained. No other bony abnormalities noted by bone scan. I discussed patient's case on tumor board 11/04/2020.  Bone  scan probably is not sensitive in detecting lytic lesion from [place].  Consensus reached upon clinically based on the CT scan, new bone lesion is highly likely metastasis from [place].  Options including proceed with bone lesion biopsy versus referral to radiation oncology for empiric palliative radiation.  Patient has other comorbidities including cirrhosis/pancytopenia, patient opts to proceed with empiric palliative radiation.  12/13/2020 finished RT to T9 June 2022 abscess of anal area , s/ drainage and antibiotics.   02/01/2021  mild wall thickening of aseconding colon. Small infraumbilical ventral hernia,contains short segment of small bowel. Cirrohsis and splenomegaly. Small volume ascites, slight increase 02/03/21 chest wo contrast showed  Lytic lesion within T9 slightly increased in size., no new skeletal metastasis. Mild peripheral ground glass densities.    INTERVAL HISTORY Henry Moore is a 60 y.o. male who has above history reviewed by me today presents for follow up visit for management of stage IV RCC  Diarrhea  has improved, occasionally.  Back pain is worse.  Seen Dr.Chrystal and was referred to establish care with neurosurgeon.   Review of Systems  Constitutional:  Positive for fatigue. Negative for appetite change, chills, fever and unexpected weight change.  HENT:   Negative for hearing loss and voice change.   Eyes:  Negative for eye problems and icterus.  Respiratory:  Negative for chest tightness, cough and shortness of breath.   Cardiovascular:  Negative for chest pain and leg swelling.  Gastrointestinal:  Positive for diarrhea. Negative for abdominal distention and abdominal pain.  Endocrine: Negative for hot flashes.  Genitourinary:  Negative for difficulty urinating, dysuria and frequency.   Musculoskeletal:  Positive for arthralgias.       Chronic back pain  Skin:  Negative for  itching and rash.       Hair loss  Neurological:  Negative for light-headedness and numbness.   Hematological:  Negative for adenopathy. Does not bruise/bleed easily.  Psychiatric/Behavioral:  Negative for confusion.    MEDICAL HISTORY:  Past Medical History:  Diagnosis Date   Cough    SINUS DRAINAGE CAUSING COUGHING , REPORTS CLEAR MUCOUS    Diabetes mellitus without complication (HCC)    Family history of breast cancer    Family history of colon cancer    Family history of stomach cancer    HTN (hypertension)    Hyperlipemia    Left renal mass    Platelets decreased ([place])    DENIES UNSUAL BLEEDING    PONV (postoperative nausea and vomiting)    Renal cell carcinoma ([place]) 08/04/2019   WBC decreased     SURGICAL HISTORY: Past Surgical History:  Procedure Laterality Date   ANKLE SURGERY Left    ANTERIOR CERVICAL DECOMP/DISCECTOMY FUSION N/A 04/16/2014   Procedure: ANTERIOR CERVICAL DECOMPRESSION/DISCECTOMY FUSION  (ACDF C5-C7)   (2 LEVELS)     ;  Surgeon: Melina Schools, MD;  Location: [facility];  Service: Orthopedics;  Laterality: N/A;   APPENDECTOMY     BACK SURGERY     ESOPHAGOGASTRODUODENOSCOPY (EGD) WITH PROPOFOL N/A 02/06/2019   Procedure: ESOPHAGOGASTRODUODENOSCOPY (EGD) WITH PROPOFOL;  Surgeon: [name], MD;  Location[facility] ENDOSCOPY;  Service: Gastroenterology;  Laterality: N/A;   ESOPHAGOGASTRODUODENOSCOPY (EGD) WITH PROPOFOL N/A 03/04/2019   Procedure: ESOPHAGOGASTRODUODENOSCOPY (EGD) WITH PROPOFOL;  Surgeon: Lin Landsman, MD;  Location: [facility];  Service: Gastroenterology;  Laterality: N/A;   ESOPHAGOGASTRODUODENOSCOPY (EGD) WITH PROPOFOL N/A 04/22/2019   Procedure: ESOPHAGOGASTRODUODENOSCOPY (EGD) WITH PROPOFOL;  Surgeon: [name], MD;  Location[facility] ENDOSCOPY;  Service: Gastroenterology;  Laterality: N/A;   FRACTURE SURGERY     HYDROCELE EXCISION     KNEE ARTHROSCOPY Bilateral    LAPAROSCOPIC NEPHRECTOMY, HAND ASSISTED Left 10/16/2018   Procedure: LEFT HAND ASSISTED LAPAROSCOPIC NEPHRECTOMY;  Surgeon: Lucas Mallow, MD;  Location: [facility];   Service: Urology;  Laterality: Left;   NASAL SINUS SURGERY     X 2   PENILE PROSTHESIS IMPLANT  10 YEARS AGO   PORTA CATH INSERTION N/A 11/11/2019   Procedure: PORTA CATH INSERTION;  Surgeon: Katha Cabal, MD;  Location: [facility];  Service: Cardiovascular;  Laterality: N/A;   SHOULDER SURGERY Left     SOCIAL HISTORY: Social History   Socioeconomic History   Marital status: Married    Spouse name: Not on file   Number of children: Not on file   Years of education: Not on file   Highest education level: Not on file  Occupational History   Not on file  Tobacco Use   Smoking status: Former    Packs/day: 0.25    Years: 20.00    Pack years: 5.00    Types: Cigarettes    Quit date: 2015    Years since quitting: 7.6   Smokeless tobacco: Never  Vaping Use   Vaping Use: Never used  Substance and Sexual Activity   Alcohol use: Not Currently    Alcohol/week: 0.0 standard drinks    Comment: no alchol in 1 year   Drug use: No   Sexual activity: Not on file  Other Topics Concern   Not on file  Social History Narrative   Not on file   Social Determinants of Health   Financial Resource Strain: Not on file  Food Insecurity: Not  on file  Transportation Needs: Not on file  Physical Activity: Not on file  Stress: Not on file  Social Connections: Not on file  Intimate Partner Violence: Not on file    FAMILY HISTORY: Family History  Problem Relation Age of Onset   Diabetes Mother    Cancer Mother        neuroendocrine cancer   Cancer Father        neuroendocrine cancer of meninges   Cancer Maternal Grandmother        tomach dx 58   Alzheimer's disease Paternal Grandmother    Lung cancer Paternal Grandfather    Lung cancer Maternal Aunt    Colon cancer Maternal Uncle    Breast cancer Paternal Aunt        both dx 22s   Ovarian cancer Other        dx 37   Breast cancer Cousin     ALLERGIES:  is allergic to codeine and mucinex [guaifenesin  er].  MEDICATIONS:  Current Outpatient Medications  Medication Sig Dispense Refill   prochlorperazine (COMPAZINE) 10 MG tablet Take 1 tablet (10 mg total) by mouth every 6 (six) hours as needed for nausea or vomiting. 30 tablet 0   ACCU-CHEK AVIVA PLUS test strip CHECK BL00D SUGAR ONCE OR TWICE DAILY. 100 each PRN   amLODipine (NORVASC) 10 MG tablet Take 10 mg by mouth daily. (Patient not taking: No sig reported)     calcium carbonate (OS-CAL - DOSED IN MG OF ELEMENTAL CALCIUM) 1250 (500 Ca) MG tablet Take 1 tablet by mouth.     carvedilol (COREG) 25 MG tablet Take 1 tablet (25 mg total) by mouth 2 (two) times daily with a meal. 180 tablet 3   Cholecalciferol (VITAMIN D3) 125 MCG (5000 UT) CAPS Take 5,000 Units by mouth daily.     diclofenac Sodium (VOLTAREN) 1 % GEL Apply 2 g topically 4 (four) times daily. 100 g 2   glipiZIDE (GLUCOTROL) 5 MG tablet Take 1 tablet (5 mg total) by mouth daily before breakfast. 90 tablet 3   hydrochlorothiazide (HYDRODIURIL) 25 MG tablet TAKE 1 TABLET DAILY. (Patient not taking: No sig reported) 90 tablet 0   insulin aspart (NOVOLOG) 100 UNIT/ML injection Inject into the skin. Inject under the skin 3 (three) times a day before meals Sliding scale, Daughters insulin, using while sugar is very out of wack from cancer treatments, temporary (Patient not taking: No sig reported)     insulin detemir (LEVEMIR) 100 UNIT/ML injection Inject into the skin. Inject 15 units under the skin every night at night, temporary, daughter's insulin (Patient not taking: No sig reported)     insulin lispro (INSULIN LISPRO) 100 UNIT/ML KwikPen Junior Inject into the skin 3 (three) times daily as needed (Above 250). Sliding scale (Patient not taking: No sig reported)     loperamide (IMODIUM A-D) 2 MG tablet Take 2 mg by mouth 4 (four) times daily as needed for diarrhea or loose stools.     losartan (COZAAR) 100 MG tablet Take 1 tablet by mouth daily. 90 tablet 0   lovastatin (MEVACOR) 40  MG tablet Take 1 tablet (40 mg total) by mouth at bedtime. 60 tablet 0   metFORMIN (GLUCOPHAGE-XR) 500 MG 24 hr tablet Take 500 mg by mouth 2 (two) times daily. 1 QAM, 1 QPM     ofloxacin (OCUFLOX) 0.3 % ophthalmic solution Dry eye (Patient not taking: No sig reported)     predniSONE (DELTASONE) 10 MG tablet Take  1 tablet (10 mg total) by mouth daily with breakfast. (Patient not taking: No sig reported) 3 tablet 0   predniSONE (DELTASONE) 20 MG tablet Take 1 tablet (20 mg total) by mouth daily with breakfast. (Patient not taking: No sig reported) 3 tablet 0   vitamin B-12 (CYANOCOBALAMIN) 1000 MCG tablet Take 1 tablet (1,000 mcg total) by mouth daily. 90 tablet 0   No current facility-administered medications for this visit.   Facility-Administered Medications Ordered in Other Visits  Medication Dose Route Frequency Provider Last Rate Last Admin   sodium chloride flush (NS) 0.9 % injection 10 mL  10 mL Intravenous PRN Earlie Server, MD   10 mL at 01/22/20 0831     PHYSICAL EXAMINATION: ECOG PERFORMANCE STATUS: 1 - Symptomatic but completely ambulatory Vitals:   03/18/21 0923  BP: 120/66  Pulse: 77  Temp: 98.1 F (36.7 C)   Filed Weights   03/18/21 0923  Weight: 251 lb (113.9 kg)    Physical Exam Constitutional:      General: He is not in acute distress.    Appearance: He is obese.  HENT:     Head: Normocephalic and atraumatic.  Eyes:     General: No scleral icterus. Cardiovascular:     Rate and Rhythm: Normal rate and regular rhythm.     Heart sounds: Normal heart sounds.  Pulmonary:     Effort: Pulmonary effort is normal. No respiratory distress.     Breath sounds: No wheezing.  Abdominal:     General: Bowel sounds are normal. There is no distension.     Palpations: Abdomen is soft.  Musculoskeletal:        General: No deformity. Normal range of motion.     Cervical back: Normal range of motion and neck supple.  Skin:    General: Skin is warm and dry.     Findings: No  erythema or rash.  Neurological:     Mental Status: He is alert and oriented to person, place, and time. Mental status is at baseline.     Cranial Nerves: No cranial nerve deficit.     Coordination: Coordination normal.  Psychiatric:        Mood and Affect: Mood normal.    LABORATORY DATA:  I have reviewed the data as listed Lab Results  Component Value Date   WBC 2.3 (L) 03/18/2021   HGB 9.5 (L) 03/18/2021   HCT 28.5 (L) 03/18/2021   MCV 92.8 03/18/2021   PLT 76 (L) 03/18/2021   Recent Labs    04/01/20 0837 04/15/20 0853 04/29/20 0849 05/21/20 1253 02/18/21 0930 03/04/21 0849 03/18/21 0856  NA 139 135 138   < > 137 136 135  K 4.3 4.3 4.2   < > 4.1 4.2 4.1  CL 107 104 108   < > 110 108 106  CO2 21* 21* 23   < > 20* 22 22  GLUCOSE 123* 162* 109*   < > 134* 125* 90  BUN 24* 22* 20   < > 45* 22* 27*  CREATININE 1.69* 1.87* 1.58*   < > 1.56* 1.83* 2.00*  CALCIUM 8.5* 8.6* 8.7*   < > 8.7* 8.3* 8.1*  GFRNONAA 44* 39* 48*   < > 51* 43* 38*  GFRAA 51* 45* 55*  --   --   --   --   PROT 7.9 7.8 8.0   < > 6.9 7.0 6.8  ALBUMIN 4.0 4.0 4.3   < > 3.5 3.3* 3.2*  AST 31 34 29   < > 130* 28 31  ALT $Re'24 27 21   'oVd$ < > 147* 21 17  ALKPHOS 102 117 108   < > 116 124 100  BILITOT 0.9 0.8 1.5*   < > 0.8 0.9 0.8   < > = values in this interval not displayed.    Iron/TIBC/Ferritin/ %Sat    Component Value Date/Time   IRON 60 09/02/2020 0822   IRON 105 10/15/2018 1325   TIBC 293 09/02/2020 0822   TIBC 244 (L) 10/15/2018 1325   FERRITIN 164 09/02/2020 0822   FERRITIN 588 (H) 10/15/2018 1325   IRONPCTSAT 21 09/02/2020 0822   IRONPCTSAT 43 10/15/2018 1325      RADIOGRAPHIC STUDIES: I have personally reviewed the radiological images as listed and agreed with the findings in the report. CT ABDOMEN PELVIS WO CONTRAST  Result Date: 02/01/2021 CLINICAL DATA:  Abdominal abscess/infection suspected Abdominal pain, acute, nonlocalized Patient reports abdominal pain and pressure with diarrhea.  Immunotherapy 624 for renal cell cancer. EXAM: CT ABDOMEN AND PELVIS WITHOUT CONTRAST TECHNIQUE: Multidetector CT imaging of the abdomen and pelvis was performed following the standard protocol without IV contrast. COMPARISON:  Most recent CT 10/12/2020 FINDINGS: Lower chest: No acute airspace disease or pleural effusion. There are coronary artery calcifications. Hepatobiliary: Cirrhotic hepatic morphology with nodular contours. No evidence of focal hepatic lesion on this unenhanced exam. Distended gallbladder with layering gallstones. No biliary dilatation. Pancreas: No ductal dilatation or inflammation. Spleen: Splenomegaly with spleen spanning 20 cm cranial caudal. No focal splenic abnormality. Adrenals/Urinary Tract: Normal adrenal glands. Left nephrectomy without abnormal soft tissue density in the nephrectomy bed. No right hydronephrosis or renal calculi. Mild right perinephric edema. No focal renal abnormality. Partially distended urinary bladder. Stomach/Bowel: The stomach is decompressed. Infraumbilical ventral abdominal wall hernia contains short segment of small bowel, however no wall thickening or proximal dilatation. No obstruction. There are occasional fluid-filled loops of small bowel in the lower pelvis. Normal appendix. Mild wall thickening of the ascending colon may represent portal colopathy or colitis. There is liquid stool in the descending and sigmoid colon. No bowel pneumatosis. Vascular/Lymphatic: Advanced aortic and branch atherosclerosis, particularly for age. No portal venous or mesenteric gas. Venous prominence at the portal splenic confluence is stable from prior exam, and not well assessed on this unenhanced exam. Scattered upper abdominal and periportal lymph nodes, not enlarged by size criteria. Reproductive: Prostate is unremarkable.  Penile prosthesis in place. Other: Small volume abdominopelvic ascites, with slight increase from prior exam. Similar generalized mesenteric stranding.  Infraumbilical ventral abdominal wall hernia just to the left of midline, series 2, image 66, contains short segment of small bowel. Musculoskeletal: Lytic lesion within T9 vertebral body has increased in size from prior exam. Tiny lytic lesion within inferior L3 vertebral bodies unchanged. No definite new osseous abnormality. IMPRESSION: 1. Mild wall thickening of the ascending colon may represent portal colopathy or colitis. Liquid stool in the descending and sigmoid colon, can be seen with diarrheal illness. 2. Small infraumbilical ventral abdominal wall hernia contains short segment of small bowel, however no wall thickening or proximal dilatation/obstruction. 3. Cirrhosis and splenomegaly. Small volume abdominopelvic ascites, with slight increase from prior exam. 4. Post left nephrectomy. 5. Lytic lesion within T9 vertebral body has minimally increased in size from prior exam. Tiny lytic lesion within inferior L3 vertebral body unchanged. 6. Advanced aortic and branch atherosclerosis. Aortic Atherosclerosis (ICD10-I70.0). Electronically Signed   By: Keith Rake M.D.   On: 02/01/2021  18:29   CT CHEST WO CONTRAST  Result Date: 02/03/2021 CLINICAL DATA:  New cell carcinoma. LEFT nephrectomy. T9 metastasis post radiation treatment. EXAM: CT CHEST WITHOUT CONTRAST TECHNIQUE: Multidetector CT imaging of the chest was performed following the standard protocol without IV contrast. COMPARISON:  CT abdomen pelvis 02/01/2021 FINDINGS: Cardiovascular: Port in the anterior chest wall with tip in distal SVC. Coronary artery calcification and aortic atherosclerotic calcification. Mediastinum/Nodes: No axillary or supraclavicular adenopathy. No mediastinal or hilar adenopathy. No pericardial fluid. Esophagus normal. Lungs/Pleura: Several peripheral ground-glass opacities are present. Measurable nodularity. Upper Abdomen: Liver is nodular. The spleen is enlarged. There is simple fluid attenuation fluid surrounding the  margin of the liver and spleen which is similar to exam 2 days prior. Caudate lobe is enlarged. Haziness in the retroperitoneal fat adjacent to the pancreas Musculoskeletal: Lytic lesion in the T9 vertebral body is increased measuring 22 mm by 21 mm (image 103/series 2) compared to 15 mm by 13 mm on CT 12/12/2020. No new sites lytic lesions are present within the spine or ribs. IMPRESSION: 1. Increase in size of lytic lesion in the T9 vertebral body compared to CT 10/12/2020. 2. No evidence of new skeletal metastasis. 3. Peripheral ground-glass densities suggest pulmonary infection/inflammation. This is subtle mild finding. 4. Fluid within the abdomen pelvis may relate to ascites from portal hypertension. Spleen is enlarged. Morphologic changes in the liver suggests cirrhosis. Electronically Signed   By: Suzy Bouchard M.D.   On: 02/03/2021 16:07   US Abdomen Complete  Result Date: 02/21/2021 CLINICAL DATA:  Renal cell carcinoma of left kidney. Abdominal distention. Cirrhosis EXAM: ABDOMEN ULTRASOUND COMPLETE COMPARISON:  CT 02/01/2021.  Ultrasound 03/11/2019 FINDINGS: Gallbladder: Small mobile gallstones throughout the gallbladder measuring up to 7 mm. Focal gallbladder wall thickening anteriorly measures 9 mm. Negative sonographic Murphy's. No pericholecystic fluid. Common bile duct: Diameter: Normal caliber, 3 mm Liver: Nodular contours with heterogeneous echotexture compatible with cirrhosis. No focal hepatic abnormality. Portal vein is patent on color Doppler imaging with normal direction of blood flow towards the liver. IVC: No abnormality visualized. Pancreas: Visualized portion unremarkable. Spleen: Splenomegaly with a craniocaudal length of 16 cm and volume 935 mL. Right Kidney: Length: 11.8 cm. Echogenicity within normal limits. No mass or hydronephrosis visualized. Left Kidney: Length: Prior nephrectomy. Abdominal aorta: No aneurysm visualized. Other findings: Small amount of ascites in the right  lower quadrant. IMPRESSION: Cirrhosis with associated splenomegaly and small amount of ascites. Cholelithiasis.  No convincing evidence of acute cholecystitis. Electronically Signed   By: Rolm Baptise M.D.   On: 02/21/2021 08:55      ASSESSMENT & PLAN:  1. Renal cell carcinoma of left kidney (HCC)   2. Anemia due to stage 3b chronic kidney disease ([place])   3. Chronic kidney disease, stage 3b ([place])   4. Cirrhosis of liver with ascites, unspecified hepatic cirrhosis type ([place])   5. Thrombocytopenia ([place])   6. Diarrhea, unspecified type   7. 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 02/01/21, 02/03/2021 CT was reviewed and discussed with patient Overall stable disease.-Except slight increase of the T9 lytic lesion.-establish care with neurosurgeon.   #Neutropenia, ANC 0.8, he is afebrile.  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 Symptoms have improved, however not completely gone. Recommend continue to hold off immunotherapy at this point.  Reevaluate in 2 weeks.  #Diabetes, glucose is stable today.  Patient is back on metformin.  #T9 lesion, status post palliative radiation.  No significant symptom improvement yet. Chronic back pain, slightly worse, probably due to chronic degenerative disease.  Advised patient to apply Voltaren cream to the area.  See Dr. Baruch Gouty  Rational and potential side effects were discussed. Recommend dental clearance which patient declined due to financial reason. He would like to waive dental clearance. Denies any dental problem.  Plan of [place] will depend on if there is plan for him to have T9 spine lesion.    #Chronic thrombocytopenia and neutropenia,  due to liver cirrhosis Stable counts.   #Chronic kidney disease, creatinine 2 Encourage oral hydration and avoid nephrotoxin.  #Anemia,  chronic, hemoglobin is 9.4 stable. monitor  Follow-up  2 weeks lab MD Nivlumab  All questions were answered. The patient knows to call the clinic with any problems questions or concerns.   Earlie Server, MD, PhD Hematology Oncology [facility] Pager- [phone] 03/19/2021

## 2021-03-25 ENCOUNTER — Other Ambulatory Visit: Payer: Self-pay | Admitting: Neurosurgery

## 2021-03-25 DIAGNOSIS — C7949 Secondary malignant neoplasm of other parts of nervous system: Secondary | ICD-10-CM

## 2021-03-25 DIAGNOSIS — C649 Malignant neoplasm of unspecified kidney, except renal pelvis: Secondary | ICD-10-CM

## 2021-03-25 DIAGNOSIS — C7951 Secondary malignant neoplasm of bone: Secondary | ICD-10-CM

## 2021-04-01 ENCOUNTER — Inpatient Hospital Stay (HOSPITAL_BASED_OUTPATIENT_CLINIC_OR_DEPARTMENT_OTHER): Payer: Medicare Other | Admitting: Oncology

## 2021-04-01 ENCOUNTER — Inpatient Hospital Stay: Payer: Medicare Other

## 2021-04-01 ENCOUNTER — Encounter: Payer: Self-pay | Admitting: Oncology

## 2021-04-01 ENCOUNTER — Other Ambulatory Visit: Payer: Self-pay

## 2021-04-01 ENCOUNTER — Inpatient Hospital Stay: Payer: Medicare Other | Attending: Hospice and Palliative Medicine

## 2021-04-01 VITALS — BP 107/59 | HR 77 | Temp 97.5°F | Resp 18 | Wt 255.8 lb

## 2021-04-01 DIAGNOSIS — D631 Anemia in chronic kidney disease: Secondary | ICD-10-CM | POA: Insufficient documentation

## 2021-04-01 DIAGNOSIS — Z794 Long term (current) use of insulin: Secondary | ICD-10-CM | POA: Diagnosis not present

## 2021-04-01 DIAGNOSIS — Z8616 Personal history of COVID-19: Secondary | ICD-10-CM | POA: Insufficient documentation

## 2021-04-01 DIAGNOSIS — J984 Other disorders of lung: Secondary | ICD-10-CM | POA: Insufficient documentation

## 2021-04-01 DIAGNOSIS — C642 Malignant neoplasm of left kidney, except renal pelvis: Secondary | ICD-10-CM | POA: Diagnosis present

## 2021-04-01 DIAGNOSIS — R188 Other ascites: Secondary | ICD-10-CM | POA: Diagnosis not present

## 2021-04-01 DIAGNOSIS — M549 Dorsalgia, unspecified: Secondary | ICD-10-CM | POA: Diagnosis not present

## 2021-04-01 DIAGNOSIS — I2510 Atherosclerotic heart disease of native coronary artery without angina pectoris: Secondary | ICD-10-CM | POA: Diagnosis not present

## 2021-04-01 DIAGNOSIS — D709 Neutropenia, unspecified: Secondary | ICD-10-CM | POA: Insufficient documentation

## 2021-04-01 DIAGNOSIS — E785 Hyperlipidemia, unspecified: Secondary | ICD-10-CM | POA: Diagnosis not present

## 2021-04-01 DIAGNOSIS — C7800 Secondary malignant neoplasm of unspecified lung: Secondary | ICD-10-CM | POA: Diagnosis not present

## 2021-04-01 DIAGNOSIS — E1122 Type 2 diabetes mellitus with diabetic chronic kidney disease: Secondary | ICD-10-CM | POA: Insufficient documentation

## 2021-04-01 DIAGNOSIS — Z87891 Personal history of nicotine dependence: Secondary | ICD-10-CM | POA: Insufficient documentation

## 2021-04-01 DIAGNOSIS — Z801 Family history of malignant neoplasm of trachea, bronchus and lung: Secondary | ICD-10-CM | POA: Insufficient documentation

## 2021-04-01 DIAGNOSIS — D696 Thrombocytopenia, unspecified: Secondary | ICD-10-CM | POA: Insufficient documentation

## 2021-04-01 DIAGNOSIS — Z791 Long term (current) use of non-steroidal anti-inflammatories (NSAID): Secondary | ICD-10-CM | POA: Insufficient documentation

## 2021-04-01 DIAGNOSIS — N1832 Chronic kidney disease, stage 3b: Secondary | ICD-10-CM | POA: Insufficient documentation

## 2021-04-01 DIAGNOSIS — K8020 Calculus of gallbladder without cholecystitis without obstruction: Secondary | ICD-10-CM | POA: Insufficient documentation

## 2021-04-01 DIAGNOSIS — K7460 Unspecified cirrhosis of liver: Secondary | ICD-10-CM | POA: Diagnosis not present

## 2021-04-01 DIAGNOSIS — K521 Toxic gastroenteritis and colitis: Secondary | ICD-10-CM | POA: Diagnosis not present

## 2021-04-01 DIAGNOSIS — K439 Ventral hernia without obstruction or gangrene: Secondary | ICD-10-CM | POA: Insufficient documentation

## 2021-04-01 DIAGNOSIS — I129 Hypertensive chronic kidney disease with stage 1 through stage 4 chronic kidney disease, or unspecified chronic kidney disease: Secondary | ICD-10-CM | POA: Insufficient documentation

## 2021-04-01 DIAGNOSIS — I700 Atherosclerosis of aorta: Secondary | ICD-10-CM | POA: Diagnosis not present

## 2021-04-01 DIAGNOSIS — C7951 Secondary malignant neoplasm of bone: Secondary | ICD-10-CM | POA: Insufficient documentation

## 2021-04-01 DIAGNOSIS — Z95828 Presence of other vascular implants and grafts: Secondary | ICD-10-CM

## 2021-04-01 DIAGNOSIS — R161 Splenomegaly, not elsewhere classified: Secondary | ICD-10-CM | POA: Diagnosis not present

## 2021-04-01 DIAGNOSIS — Z803 Family history of malignant neoplasm of breast: Secondary | ICD-10-CM | POA: Insufficient documentation

## 2021-04-01 DIAGNOSIS — Z905 Acquired absence of kidney: Secondary | ICD-10-CM | POA: Insufficient documentation

## 2021-04-01 DIAGNOSIS — Z5112 Encounter for antineoplastic immunotherapy: Secondary | ICD-10-CM

## 2021-04-01 LAB — COMPREHENSIVE METABOLIC PANEL
ALT: 18 U/L (ref 0–44)
AST: 34 U/L (ref 15–41)
Albumin: 3 g/dL — ABNORMAL LOW (ref 3.5–5.0)
Alkaline Phosphatase: 95 U/L (ref 38–126)
Anion gap: 7 (ref 5–15)
BUN: 26 mg/dL — ABNORMAL HIGH (ref 6–20)
CO2: 21 mmol/L — ABNORMAL LOW (ref 22–32)
Calcium: 8 mg/dL — ABNORMAL LOW (ref 8.9–10.3)
Chloride: 105 mmol/L (ref 98–111)
Creatinine, Ser: 2 mg/dL — ABNORMAL HIGH (ref 0.61–1.24)
GFR, Estimated: 38 mL/min — ABNORMAL LOW (ref >60)
Glucose, Bld: 124 mg/dL — ABNORMAL HIGH (ref 70–99)
Potassium: 4.3 mmol/L (ref 3.5–5.1)
Sodium: 133 mmol/L — ABNORMAL LOW (ref 135–145)
Total Bilirubin: 1 mg/dL (ref 0.3–1.2)
Total Protein: 6.5 g/dL (ref 6.5–8.1)

## 2021-04-01 LAB — CBC WITH DIFFERENTIAL/PLATELET
Abs Immature Granulocytes: 0.01 10*3/uL (ref 0.00–0.07)
Basophils Absolute: 0 10*3/uL (ref 0.0–0.1)
Basophils Relative: 1 %
Eosinophils Absolute: 0.2 10*3/uL (ref 0.0–0.5)
Eosinophils Relative: 6 %
HCT: 27.7 % — ABNORMAL LOW (ref 39.0–52.0)
Hemoglobin: 9.1 g/dL — ABNORMAL LOW (ref 13.0–17.0)
Immature Granulocytes: 0 %
Lymphocytes Relative: 11 %
Lymphs Abs: 0.3 10*3/uL — ABNORMAL LOW (ref 0.7–4.0)
MCH: 30.5 pg (ref 26.0–34.0)
MCHC: 32.9 g/dL (ref 30.0–36.0)
MCV: 93 fL (ref 80.0–100.0)
Monocytes Absolute: 0.4 10*3/uL (ref 0.1–1.0)
Monocytes Relative: 13 %
Neutro Abs: 1.8 10*3/uL (ref 1.7–7.7)
Neutrophils Relative %: 69 %
Platelets: 87 10*3/uL — ABNORMAL LOW (ref 150–400)
RBC: 2.98 MIL/uL — ABNORMAL LOW (ref 4.22–5.81)
RDW: 13.8 % (ref 11.5–15.5)
WBC: 2.6 10*3/uL — ABNORMAL LOW (ref 4.0–10.5)
nRBC: 0 % (ref 0.0–0.2)

## 2021-04-01 LAB — TSH: TSH: 4.608 u[IU]/mL — ABNORMAL HIGH (ref 0.350–4.500)

## 2021-04-01 LAB — T4, FREE: Free T4: 1.37 ng/dL — ABNORMAL HIGH (ref 0.61–1.12)

## 2021-04-01 MED ORDER — SODIUM CHLORIDE 0.9% FLUSH
10.0000 mL | Freq: Once | INTRAVENOUS | Status: AC
Start: 2021-04-01 — End: 2021-04-01
  Administered 2021-04-01: 10 mL via INTRAVENOUS
  Filled 2021-04-01: qty 10

## 2021-04-01 MED ORDER — HEPARIN SOD (PORK) LOCK FLUSH 100 UNIT/ML IV SOLN
500.0000 [IU] | Freq: Once | INTRAVENOUS | Status: AC
Start: 2021-04-01 — End: 2021-04-01
  Administered 2021-04-01: 500 [IU] via INTRAVENOUS
  Filled 2021-04-01: qty 5

## 2021-04-01 MED ORDER — HEPARIN SOD (PORK) LOCK FLUSH 100 UNIT/ML IV SOLN
500.0000 [IU] | Freq: Once | INTRAVENOUS | Status: DC
  Filled 2021-04-01: qty 5

## 2021-04-01 NOTE — Progress Notes (Signed)
Pt here for follow up. Pt reports he continues to have diarrhea.

## 2021-04-01 NOTE — Progress Notes (Signed)
Hematology/Oncology follow-up [facility] [phone] [phone]   Patient Care Team: Albina Billet, MD as PCP - General (Internal Medicine) Earlie Server, MD as Consulting Physician (Hematology and Oncology)  REFERRING PROVIDER: Albina Billet, MD  CHIEF COMPLAINTS/REASON FOR VISIT:  Follow-up for kidney cancer treatments  HISTORY OF PRESENTING ILLNESS:   Henry Moore is a  60 y.o.  male with PMH listed below was seen in consultation at the request of  Albina Billet, MD  for evaluation of kidney cancer Extensive medical records were reviewed Patient had MRI lumbar spine without contrast done on 07/13/2018 which showed mired lumbar degenerative disease. CT thoracic spine was done on 08/05/2018 which showed 5 cm left renal mass. 09/04/2018 CT abdomen and pelvis with and without contrast showed 5 cm round enhancing solid mass in the left kidney.  No involvement of left renal vein.  No lymphadenopathy Morphologic changes consistent with cirrhosis and the portal hypertension.  Small volume ascites and splenomegaly. . 10/16/2018 Left nephrectomy showed renal cell carcinoma, clear-cell type, nuclear grade 4, tumor extends into the renal vein and renal sinus fat.  Urethral, vascular and or resection margins are negative for tumor pT3a Nx Mx  01/28/2019 patient had another CT chest abdomen pelvis done with contrast Showed interval left nephrectomy.  Scattered tiny pulmonary nodules bilaterally.  Nonspecific.  No other evidence of metastatic disease.  Cirrhosis changes extensive coronary and aortic atherosclerosis  07/14/2019 patient underwent surveillance CT chest abdomen pelvis without contrast Interval progression of pulmonary metastasis with new enlarged right paratracheal lymph node 1.7 cm, previously 0.6 cm one-point since worrisome for metastatic adenopathy.  Multiple progressive pulmonary nodules, index nodule within the lingula measures 1.3 cm, previously  3 mm. Index subpleural nodule within the anterior right upper lobe measuring 1.8 cm, previously 3 cm Index nodule within the post lateral right lower lobe measuring 5 mm, new from previous care Aortic sclerosis.  Three-vessel coronary artery calcification noted.  Cirrhosis changes.  Splenomegaly.  Patient was referred to establish care with medical oncology for further discussion and evaluation of metastatic renal cell carcinoma. Patient reports feeling well at baseline today.  Denies any shortness of breath, cough, hemoptysis, back pain, headache. He quitted smoking 2015.  Not currently actively drinking alcohol as well. Patient has chronic back pain unchanged.  #08/14/2019-10/16/2019 nivolumab and ipilimumab. #11/06/2019-nivolumab every 2 weeks maintenance.  # Liver cirrhosis with small amount of ascites/splenomegaly patient sees gastroenterology Dr. [name].  . He will get ultrasound surveillance due in February 2021 for screening of [name].  EGD surveillance for Barrett's plan in April 2021.  # Diabetes, on Humalog sliding scale and metformin.  #Family history of cancer and personal history of RCC.  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  Status post ipilimumab and nivolumab treatment.  # 10/29/2019 CT chest abdomen pelvis without contrast images were independently reviewed by me and discussed with patient.   CT showed marked improvement with resolution of many of the pulmonary nodules, prominent reduced size of other nodules.  Considerably reduced size of the right lower paratracheal lymph node now 0.9 cm in diameter. Hepatic cirrhosis with prominent splenomegaly and trace perisplenic ascites. Chronic CT findings includes aortic atherosclerotic vascular  disease.  Mild sigmoid colon diverticulosis  # CKD, he  establish care with Dr. Thedore Mins for chronic kidney disease.  Blood pressure medication has been adjusted.  Hydrochlorothiazide decreased to 12.5 mg daily.  # NGS -foundation one PD-L1 TPS 0% no reportable mutation, MS stable. TMB 8 muts/mb VHL [id number]*16, SETD2 BAP Genetic testing is negative.   # 07/16/2020, CT chest abdomen pelvis images were reviewed and discussed with patient Compared to July 2021 image, he has had a new patchy groundglass opacities throughout both lungs with central part of solids/airspace components.  Findings are nonspecific and could be secondary to an atypical infection, including viral pneumonia or inflammation.  Patient is asymptomatic.  I wonder the changes are secondary to his Covid infection.  Patient has been started on Levaquin to cover atypical infection by on-call physician and I recommend him to continue and complete the course. Proceed with today's immunotherapy nivolumab.  Advised patient to notify me if his symptoms get worse.  # 10/12/2020 CT chest abdomen pelvis with out contrast. New 15 mm lytic lesion in the right T9 vertebral body.  Suspicious for isolated lytic metastasis.  No findings of suspicious soft tissue metastasis in the chest abdomen or pelvis.  Residual postinfectious inflammatory scarring in the lung bilaterally.  Cirrhosis.  Splenomegaly.  Minimal ascites.  # 10/26/20 Bone scan showed Minimal increased activity noted at the right costovertebral junction at the T9 level is most likely degenerative. Similar finding noted on prior bone scan of 08/11/2019. No definite T9 vertebral body abnormal activity corresponding to lytic lesion noted on recent CT. Again the recently identified T9 vertebral body lytic lesion is suspicious and further evaluation of the thoracic spine with MRI can be obtained. No other bony abnormalities noted by bone scan. I discussed patient's case on tumor board 11/04/2020.  Bone  scan probably is not sensitive in detecting lytic lesion from RCC.  Consensus reached upon clinically based on the CT scan, new bone lesion is highly likely metastasis from RCC.  Options including proceed with bone lesion biopsy versus referral to radiation oncology for empiric palliative radiation.  Patient has other comorbidities including cirrhosis/pancytopenia, patient opts to proceed with empiric palliative radiation.  12/13/2020 finished RT to T9 June 2022 abscess of anal area , s/ drainage and antibiotics. July 2022, immunotherapy was held due to diarrhea.  02/01/2021  mild wall thickening of aseconding colon. Small infraumbilical ventral hernia,contains short segment of small bowel. Cirrohsis and splenomegaly. Small volume ascites, slight increase 02/03/21 chest wo contrast showed  Lytic lesion within T9 slightly increased in size., no new skeletal metastasis. Mild peripheral ground glass densities.   03/18/2021, resumed on nivolumab treatment x1. 04/01/2021, hold nivolumab due to diarrhea and upcoming surgery.   INTERVAL HISTORY DEONTREY MASSI is a 60 y.o. male who has above history reviewed by me today presents for follow up visit for management of stage IV RCC Diarrhea got worse after last nivolumab treatments for 3 days.  7-8 episodes of loose bowel movements.  After 3 days, symptoms gradually improved.  He has good days and bad days.  On good days, he has an average of 2 loose bowel movements per day.  On bad days, 7-8 loose bowel movements.  Denies any abdominal pain.  He has establish care with neurosurgeon and there is plan of kyphoplasty at the end of September 2022.  Review of Systems  Constitutional:  Positive for fatigue. Negative for appetite change, chills, fever and unexpected weight change.  HENT:   Negative for hearing loss and voice change.   Eyes:  Negative for eye problems and icterus.  Respiratory:  Negative for chest tightness, cough and shortness of breath.    Cardiovascular:  Negative for chest pain and leg swelling.  Gastrointestinal:  Positive for diarrhea. Negative for abdominal distention and abdominal pain.  Endocrine: Negative for hot flashes.  Genitourinary:  Negative for difficulty urinating, dysuria and frequency.   Musculoskeletal:  Positive for arthralgias.       Chronic back pain  Skin:  Negative for itching and rash.       Hair loss  Neurological:  Negative for light-headedness and numbness.  Hematological:  Negative for adenopathy. Does not bruise/bleed easily.  Psychiatric/Behavioral:  Negative for confusion.    MEDICAL HISTORY:  Past Medical History:  Diagnosis Date   Cough    SINUS DRAINAGE CAUSING COUGHING , REPORTS CLEAR MUCOUS    Diabetes mellitus without complication (HCC)    Family history of breast cancer    Family history of colon cancer    Family history of stomach cancer    HTN (hypertension)    Hyperlipemia    Left renal mass    Platelets decreased (HCC)    DENIES UNSUAL BLEEDING    PONV (postoperative nausea and vomiting)    Renal cell carcinoma (HCC) 08/04/2019   WBC decreased     SURGICAL HISTORY: Past Surgical History:  Procedure Laterality Date   ANKLE SURGERY Left    ANTERIOR CERVICAL DECOMP/DISCECTOMY FUSION N/A 04/16/2014   Procedure: ANTERIOR CERVICAL DECOMPRESSION/DISCECTOMY FUSION  (ACDF C5-C7)   (2 LEVELS)     ;  Surgeon: Venita Lick, MD;  Location: [facility] OR;  Service: Orthopedics;  Laterality: N/A;   APPENDECTOMY     BACK SURGERY     ESOPHAGOGASTRODUODENOSCOPY (EGD) WITH PROPOFOL N/A 02/06/2019   Procedure: ESOPHAGOGASTRODUODENOSCOPY (EGD) WITH PROPOFOL;  Surgeon: Wyline Mood, MD;  Location[facility] ENDOSCOPY;  Service: Gastroenterology;  Laterality: N/A;   ESOPHAGOGASTRODUODENOSCOPY (EGD) WITH PROPOFOL N/A 03/04/2019   Procedure: ESOPHAGOGASTRODUODENOSCOPY (EGD) WITH PROPOFOL;  Surgeon: Toney Reil, MD;  Location: [facility] ENDOSCOPY;  Service: Gastroenterology;  Laterality: N/A;    ESOPHAGOGASTRODUODENOSCOPY (EGD) WITH PROPOFOL N/A 04/22/2019   Procedure: ESOPHAGOGASTRODUODENOSCOPY (EGD) WITH PROPOFOL;  Surgeon: Wyline Mood, MD;  Location: [facility] ENDOSCOPY;  Service: Gastroenterology;  Laterality: N/A;   FRACTURE SURGERY     HYDROCELE EXCISION     KNEE ARTHROSCOPY Bilateral    LAPAROSCOPIC NEPHRECTOMY, HAND ASSISTED Left 10/16/2018   Procedure: LEFT HAND ASSISTED LAPAROSCOPIC NEPHRECTOMY;  Surgeon: Crista Elliot, MD;  Location: [facility] ORS;  Service: Urology;  Laterality: Left;   NASAL SINUS SURGERY     X 2   PENILE PROSTHESIS IMPLANT  10 YEARS AGO   PORTA CATH INSERTION N/A 11/11/2019   Procedure: PORTA CATH INSERTION;  Surgeon: Renford Dills, MD;  Location: [facility] INVASIVE CV LAB;  Service: Cardiovascular;  Laterality: N/A;   SHOULDER SURGERY Left     SOCIAL HISTORY: Social History   Socioeconomic History   Marital status: Married    Spouse name: Not on file   Number of children: Not on file   Years of education: Not on file   Highest education level: Not on file  Occupational History   Not on file  Tobacco Use   Smoking status: Former    Packs/day: 0.25    Years: 20.00    Pack years: 5.00    Types: Cigarettes    Quit date: 2015    Years since quitting: 7.6   Smokeless tobacco: Never  Vaping Use  Vaping Use: Never used  Substance and Sexual Activity   Alcohol use: Not Currently    Alcohol/week: 0.0 standard drinks    Comment: no alchol in 1 year   Drug use: No   Sexual activity: Not on file  Other Topics Concern   Not on file  Social History Narrative   Not on file   Social Determinants of Health   Financial Resource Strain: Not on file  Food Insecurity: Not on file  Transportation Needs: Not on file  Physical Activity: Not on file  Stress: Not on file  Social Connections: Not on file  Intimate Partner Violence: Not on file    FAMILY HISTORY: Family History  Problem Relation Age of Onset   Diabetes Mother    Cancer Mother         neuroendocrine cancer   Cancer Father        neuroendocrine cancer of meninges   Cancer Maternal Grandmother        tomach dx 41   Alzheimer's disease Paternal Grandmother    Lung cancer Paternal Grandfather    Lung cancer Maternal Aunt    Colon cancer Maternal Uncle    Breast cancer Paternal Aunt        both dx 59s   Ovarian cancer Other        dx 22   Breast cancer Cousin     ALLERGIES:  is allergic to codeine and mucinex [guaifenesin er].  MEDICATIONS:  Current Outpatient Medications  Medication Sig Dispense Refill   ACCU-CHEK AVIVA PLUS test strip CHECK BL00D SUGAR ONCE OR TWICE DAILY. 100 each PRN   calcium carbonate (OS-CAL - DOSED IN MG OF ELEMENTAL CALCIUM) 1250 (500 Ca) MG tablet Take 1 tablet by mouth.     carvedilol (COREG) 25 MG tablet Take 1 tablet (25 mg total) by mouth 2 (two) times daily with a meal. 180 tablet 3   Cholecalciferol (VITAMIN D3) 125 MCG (5000 UT) CAPS Take 5,000 Units by mouth daily.     diclofenac Sodium (VOLTAREN) 1 % GEL Apply 2 g topically 4 (four) times daily. 100 g 2   glipiZIDE (GLUCOTROL) 5 MG tablet Take 1 tablet (5 mg total) by mouth daily before breakfast. 90 tablet 3   loperamide (IMODIUM A-D) 2 MG tablet Take 2 mg by mouth 4 (four) times daily as needed for diarrhea or loose stools.     losartan (COZAAR) 100 MG tablet Take 1 tablet by mouth daily. 90 tablet 0   lovastatin (MEVACOR) 40 MG tablet Take 1 tablet (40 mg total) by mouth at bedtime. 60 tablet 0   metFORMIN (GLUCOPHAGE-XR) 500 MG 24 hr tablet Take 500 mg by mouth 2 (two) times daily. 1 QAM, 1 QPM     prochlorperazine (COMPAZINE) 10 MG tablet Take 1 tablet (10 mg total) by mouth every 6 (six) hours as needed for nausea or vomiting. 30 tablet 0   vitamin B-12 (CYANOCOBALAMIN) 1000 MCG tablet Take 1 tablet (1,000 mcg total) by mouth daily. 90 tablet 0   amLODipine (NORVASC) 10 MG tablet Take 10 mg by mouth daily. (Patient not taking: No sig reported)     hydrochlorothiazide  (HYDRODIURIL) 25 MG tablet TAKE 1 TABLET DAILY. (Patient not taking: No sig reported) 90 tablet 0   insulin aspart (NOVOLOG) 100 UNIT/ML injection Inject into the skin. Inject under the skin 3 (three) times a day before meals Sliding scale, Daughters insulin, using while sugar is very out of wack from cancer treatments, temporary (  Patient not taking: No sig reported)     insulin detemir (LEVEMIR) 100 UNIT/ML injection Inject into the skin. Inject 15 units under the skin every night at night, temporary, daughter's insulin (Patient not taking: No sig reported)     insulin lispro (INSULIN LISPRO) 100 UNIT/ML KwikPen Junior Inject into the skin 3 (three) times daily as needed (Above 250). Sliding scale (Patient not taking: No sig reported)     ofloxacin (OCUFLOX) 0.3 % ophthalmic solution Dry eye (Patient not taking: No sig reported)     predniSONE (DELTASONE) 10 MG tablet Take 1 tablet (10 mg total) by mouth daily with breakfast. (Patient not taking: No sig reported) 3 tablet 0   predniSONE (DELTASONE) 20 MG tablet Take 1 tablet (20 mg total) by mouth daily with breakfast. (Patient not taking: No sig reported) 3 tablet 0   No current facility-administered medications for this visit.   Facility-Administered Medications Ordered in Other Visits  Medication Dose Route Frequency Provider Last Rate Last Admin   sodium chloride flush (NS) 0.9 % injection 10 mL  10 mL Intravenous PRN Earlie Server, MD   10 mL at 01/22/20 0831     PHYSICAL EXAMINATION: ECOG PERFORMANCE STATUS: 1 - Symptomatic but completely ambulatory Vitals:   04/01/21 1030  BP: (!) 107/59  Pulse: 77  Resp: 18  Temp: (!) 97.5 F (36.4 C)   Filed Weights   04/01/21 1030  Weight: 255 lb 12.8 oz (116 kg)    Physical Exam Constitutional:      General: He is not in acute distress.    Appearance: He is obese.  HENT:     Head: Normocephalic and atraumatic.  Eyes:     General: No scleral icterus. Cardiovascular:     Rate and Rhythm:  Normal rate and regular rhythm.     Heart sounds: Normal heart sounds.  Pulmonary:     Effort: Pulmonary effort is normal. No respiratory distress.     Breath sounds: No wheezing.  Abdominal:     General: Bowel sounds are normal. There is no distension.     Palpations: Abdomen is soft.  Musculoskeletal:        General: No deformity. Normal range of motion.     Cervical back: Normal range of motion and neck supple.  Skin:    General: Skin is warm and dry.     Findings: No erythema or rash.  Neurological:     Mental Status: He is alert and oriented to person, place, and time. Mental status is at baseline.     Cranial Nerves: No cranial nerve deficit.     Coordination: Coordination normal.  Psychiatric:        Mood and Affect: Mood normal.    LABORATORY DATA:  I have reviewed the data as listed Lab Results  Component Value Date   WBC 2.3 (L) 03/18/2021   HGB 9.5 (L) 03/18/2021   HCT 28.5 (L) 03/18/2021   MCV 92.8 03/18/2021   PLT 76 (L) 03/18/2021   Recent Labs    04/15/20 0853 04/29/20 0849 05/21/20 1253 02/18/21 0930 03/04/21 0849 03/18/21 0856  NA 135 138   < > 137 136 135  K 4.3 4.2   < > 4.1 4.2 4.1  CL 104 108   < > 110 108 106  CO2 21* 23   < > 20* 22 22  GLUCOSE 162* 109*   < > 134* 125* 90  BUN 22* 20   < > 45* 22* 27*  CREATININE 1.87* 1.58*   < > 1.56* 1.83* 2.00*  CALCIUM 8.6* 8.7*   < > 8.7* 8.3* 8.1*  GFRNONAA 39* 48*   < > 51* 43* 38*  GFRAA 45* 55*  --   --   --   --   PROT 7.8 8.0   < > 6.9 7.0 6.8  ALBUMIN 4.0 4.3   < > 3.5 3.3* 3.2*  AST 34 29   < > 130* 28 31  ALT 27 21   < > 147* 21 17  ALKPHOS 117 108   < > 116 124 100  BILITOT 0.8 1.5*   < > 0.8 0.9 0.8   < > = values in this interval not displayed.    Iron/TIBC/Ferritin/ %Sat    Component Value Date/Time   IRON 60 09/02/2020 0822   IRON 105 10/15/2018 1325   TIBC 293 09/02/2020 0822   TIBC 244 (L) 10/15/2018 1325   FERRITIN 164 09/02/2020 0822   FERRITIN 588 (H) 10/15/2018 1325    IRONPCTSAT 21 09/02/2020 0822   IRONPCTSAT 43 10/15/2018 1325      RADIOGRAPHIC STUDIES: I have personally reviewed the radiological images as listed and agreed with the findings in the report. CT ABDOMEN PELVIS WO CONTRAST  Result Date: 02/01/2021 CLINICAL DATA:  Abdominal abscess/infection suspected Abdominal pain, acute, nonlocalized Patient reports abdominal pain and pressure with diarrhea. Immunotherapy 624 for renal cell cancer. EXAM: CT ABDOMEN AND PELVIS WITHOUT CONTRAST TECHNIQUE: Multidetector CT imaging of the abdomen and pelvis was performed following the standard protocol without IV contrast. COMPARISON:  Most recent CT 10/12/2020 FINDINGS: Lower chest: No acute airspace disease or pleural effusion. There are coronary artery calcifications. Hepatobiliary: Cirrhotic hepatic morphology with nodular contours. No evidence of focal hepatic lesion on this unenhanced exam. Distended gallbladder with layering gallstones. No biliary dilatation. Pancreas: No ductal dilatation or inflammation. Spleen: Splenomegaly with spleen spanning 20 cm cranial caudal. No focal splenic abnormality. Adrenals/Urinary Tract: Normal adrenal glands. Left nephrectomy without abnormal soft tissue density in the nephrectomy bed. No right hydronephrosis or renal calculi. Mild right perinephric edema. No focal renal abnormality. Partially distended urinary bladder. Stomach/Bowel: The stomach is decompressed. Infraumbilical ventral abdominal wall hernia contains short segment of small bowel, however no wall thickening or proximal dilatation. No obstruction. There are occasional fluid-filled loops of small bowel in the lower pelvis. Normal appendix. Mild wall thickening of the ascending colon may represent portal colopathy or colitis. There is liquid stool in the descending and sigmoid colon. No bowel pneumatosis. Vascular/Lymphatic: Advanced aortic and branch atherosclerosis, particularly for age. No portal venous or  mesenteric gas. Venous prominence at the portal splenic confluence is stable from prior exam, and not well assessed on this unenhanced exam. Scattered upper abdominal and periportal lymph nodes, not enlarged by size criteria. Reproductive: Prostate is unremarkable.  Penile prosthesis in place. Other: Small volume abdominopelvic ascites, with slight increase from prior exam. Similar generalized mesenteric stranding. Infraumbilical ventral abdominal wall hernia just to the left of midline, series 2, image 66, contains short segment of small bowel. Musculoskeletal: Lytic lesion within T9 vertebral body has increased in size from prior exam. Tiny lytic lesion within inferior L3 vertebral bodies unchanged. No definite new osseous abnormality. IMPRESSION: 1. Mild wall thickening of the ascending colon may represent portal colopathy or colitis. Liquid stool in the descending and sigmoid colon, can be seen with diarrheal illness. 2. Small infraumbilical ventral abdominal wall hernia contains short segment of small bowel, however no wall thickening  or proximal dilatation/obstruction. 3. Cirrhosis and splenomegaly. Small volume abdominopelvic ascites, with slight increase from prior exam. 4. Post left nephrectomy. 5. Lytic lesion within T9 vertebral body has minimally increased in size from prior exam. Tiny lytic lesion within inferior L3 vertebral body unchanged. 6. Advanced aortic and branch atherosclerosis. Aortic Atherosclerosis (ICD10-I70.0). Electronically Signed   By: Keith Rake M.D.   On: 02/01/2021 18:29   CT CHEST WO CONTRAST  Result Date: 02/03/2021 CLINICAL DATA:  New cell carcinoma. LEFT nephrectomy. T9 metastasis post radiation treatment. EXAM: CT CHEST WITHOUT CONTRAST TECHNIQUE: Multidetector CT imaging of the chest was performed following the standard protocol without IV contrast. COMPARISON:  CT abdomen pelvis 02/01/2021 FINDINGS: Cardiovascular: Port in the anterior chest wall with tip in distal  SVC. Coronary artery calcification and aortic atherosclerotic calcification. Mediastinum/Nodes: No axillary or supraclavicular adenopathy. No mediastinal or hilar adenopathy. No pericardial fluid. Esophagus normal. Lungs/Pleura: Several peripheral ground-glass opacities are present. Measurable nodularity. Upper Abdomen: Liver is nodular. The spleen is enlarged. There is simple fluid attenuation fluid surrounding the margin of the liver and spleen which is similar to exam 2 days prior. Caudate lobe is enlarged. Haziness in the retroperitoneal fat adjacent to the pancreas Musculoskeletal: Lytic lesion in the T9 vertebral body is increased measuring 22 mm by 21 mm (image 103/series 2) compared to 15 mm by 13 mm on CT 12/12/2020. No new sites lytic lesions are present within the spine or ribs. IMPRESSION: 1. Increase in size of lytic lesion in the T9 vertebral body compared to CT 10/12/2020. 2. No evidence of new skeletal metastasis. 3. Peripheral ground-glass densities suggest pulmonary infection/inflammation. This is subtle mild finding. 4. Fluid within the abdomen pelvis may relate to ascites from portal hypertension. Spleen is enlarged. Morphologic changes in the liver suggests cirrhosis. Electronically Signed   By: Suzy Bouchard M.D.   On: 02/03/2021 16:07   US Abdomen Complete  Result Date: 02/21/2021 CLINICAL DATA:  Renal cell carcinoma of left kidney. Abdominal distention. Cirrhosis EXAM: ABDOMEN ULTRASOUND COMPLETE COMPARISON:  CT 02/01/2021.  Ultrasound 03/11/2019 FINDINGS: Gallbladder: Small mobile gallstones throughout the gallbladder measuring up to 7 mm. Focal gallbladder wall thickening anteriorly measures 9 mm. Negative sonographic Murphy's. No pericholecystic fluid. Common bile duct: Diameter: Normal caliber, 3 mm Liver: Nodular contours with heterogeneous echotexture compatible with cirrhosis. No focal hepatic abnormality. Portal vein is patent on color Doppler imaging with normal direction of  blood flow towards the liver. IVC: No abnormality visualized. Pancreas: Visualized portion unremarkable. Spleen: Splenomegaly with a craniocaudal length of 16 cm and volume 935 mL. Right Kidney: Length: 11.8 cm. Echogenicity within normal limits. No mass or hydronephrosis visualized. Left Kidney: Length: Prior nephrectomy. Abdominal aorta: No aneurysm visualized. Other findings: Small amount of ascites in the right lower quadrant. IMPRESSION: Cirrhosis with associated splenomegaly and small amount of ascites. Cholelithiasis.  No convincing evidence of acute cholecystitis. Electronically Signed   By: Rolm Baptise M.D.   On: 02/21/2021 08:55      ASSESSMENT & PLAN:  1. Renal cell carcinoma of left kidney (HCC)   2. Anemia due to stage 3b chronic kidney disease ([place])   3. Chronic kidney disease, stage 3b ([place])   4. Cirrhosis of liver with ascites, unspecified hepatic cirrhosis type ([place])   5. Thrombocytopenia ([place])   6. 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 Hold immunotherapy due to diarrhea and upcoming neurosurgery  #Neutropenia, ANC  1.8.  Resolved. #Immunotherapy induced colitis, hold immunotherapy.  Continue Imodium as needed as instructed.  #T9 lesion, status post palliative radiation.  Establish care with neurosurgeon.  Plan for kyphoplasty.   MRI spine is ordered preop. I will hold off Xgeva due to the upcoming kyphoplasty.  #Chronic thrombocytopenia and neutropenia,  due to liver cirrhosis Stable counts.   #Chronic kidney disease, creatinine 2 Encourage oral hydration and avoid nephrotoxin.  #Anemia, chronic, hemoglobin is 9.1 monitor.  Follow-up  2 weeks his surgery for reevaluation of starting immunotherapy.  All questions were answered. The patient knows to call the clinic with any problems questions or concerns.   Earlie Server, MD, PhD Hematology Oncology [place] at [facility]  04/01/2021

## 2021-04-08 ENCOUNTER — Other Ambulatory Visit: Payer: Self-pay

## 2021-04-08 ENCOUNTER — Telehealth: Payer: Self-pay | Admitting: *Deleted

## 2021-04-08 ENCOUNTER — Ambulatory Visit
Admission: RE | Admit: 2021-04-08 | Discharge: 2021-04-08 | Disposition: A | Discharge status: Home / Self Care | Payer: Medicare Other | Source: Ambulatory Visit | Attending: Oncology | Admitting: Oncology

## 2021-04-08 DIAGNOSIS — C642 Malignant neoplasm of left kidney, except renal pelvis: Secondary | ICD-10-CM

## 2021-04-08 DIAGNOSIS — E860 Dehydration: Secondary | ICD-10-CM | POA: Insufficient documentation

## 2021-04-08 DIAGNOSIS — E43 Unspecified severe protein-calorie malnutrition: Secondary | ICD-10-CM | POA: Insufficient documentation

## 2021-04-08 DIAGNOSIS — R634 Abnormal weight loss: Secondary | ICD-10-CM | POA: Insufficient documentation

## 2021-04-08 DIAGNOSIS — Z9189 Other specified personal risk factors, not elsewhere classified: Secondary | ICD-10-CM | POA: Insufficient documentation

## 2021-04-08 DIAGNOSIS — R52 Pain, unspecified: Secondary | ICD-10-CM | POA: Insufficient documentation

## 2021-04-08 NOTE — Telephone Encounter (Signed)
Call from wife reporting that patient has a very swollen and tight abdominal with fluid build up and he wold like to have it removed today if at all possible. Please advise

## 2021-04-08 NOTE — Telephone Encounter (Signed)
Patient scheduled for Para today at 4 with arrival tome of 55 and was notified of this by Geanie Kenning, RN

## 2021-04-08 NOTE — Telephone Encounter (Signed)
Pt advised that if he does not make it to appt at that time, that he should proceed to ER due to shortness of breath. Pt voiced understanding.

## 2021-04-08 NOTE — Procedures (Signed)
Vascular and Interventional Radiology Procedure Note  Patient: Henry Moore DOB: 08/15/1960 Medical Record Number: [id number] Note Date/Time: 04/08/21 5:13 PM   Performing Physician: Michaelle Birks, MD Assistant(s): None  Diagnosis:  Symptomatic ascites  Procedure: PARACENTESIS  Anesthesia: Local Anesthetic Complications: None Estimated Blood Loss: Minimal Specimens:  Sent Cytology  Findings:  The Right Upper quadrant peritoneal space was accessed with a 65F Pigtail catheter. Intravenous Albumin: Was not administered.  See detailed procedure note with images in PACS. The patient tolerated the procedure well without incident or complication and was returned to Recovery in stable condition.    Michaelle Birks, MD [facility] Radiology   Pager. [place]

## 2021-04-11 ENCOUNTER — Other Ambulatory Visit: Payer: Self-pay

## 2021-04-11 ENCOUNTER — Ambulatory Visit
Admission: RE | Admit: 2021-04-11 | Discharge: 2021-04-11 | Disposition: A | Discharge status: Home / Self Care | Payer: BC Managed Care – PPO | Source: Ambulatory Visit | Attending: Neurosurgery | Admitting: Neurosurgery

## 2021-04-11 DIAGNOSIS — C7949 Secondary malignant neoplasm of other parts of nervous system: Secondary | ICD-10-CM | POA: Diagnosis present

## 2021-04-11 DIAGNOSIS — C7951 Secondary malignant neoplasm of bone: Secondary | ICD-10-CM | POA: Diagnosis present

## 2021-04-11 DIAGNOSIS — C649 Malignant neoplasm of unspecified kidney, except renal pelvis: Secondary | ICD-10-CM | POA: Insufficient documentation

## 2021-04-11 MED ORDER — GADOBUTROL 1 MMOL/ML IV SOLN
10.0000 mL | Freq: Once | INTRAVENOUS | Status: AC | PRN
  Administered 2021-04-11: 10 mL via INTRAVENOUS

## 2021-04-14 ENCOUNTER — Emergency Department: Payer: BC Managed Care – PPO

## 2021-04-14 ENCOUNTER — Other Ambulatory Visit: Payer: Self-pay

## 2021-04-14 ENCOUNTER — Telehealth: Payer: Self-pay | Admitting: *Deleted

## 2021-04-14 ENCOUNTER — Observation Stay
Admission: EM | Admit: 2021-04-14 | Discharge: 2021-04-16 | Disposition: A | Discharge status: Home / Self Care | Payer: Medicare Other | Attending: Obstetrics and Gynecology | Admitting: Obstetrics and Gynecology

## 2021-04-14 DIAGNOSIS — Z87891 Personal history of nicotine dependence: Secondary | ICD-10-CM | POA: Insufficient documentation

## 2021-04-14 DIAGNOSIS — C649 Malignant neoplasm of unspecified kidney, except renal pelvis: Secondary | ICD-10-CM | POA: Insufficient documentation

## 2021-04-14 DIAGNOSIS — K7031 Alcoholic cirrhosis of liver with ascites: Principal | ICD-10-CM | POA: Insufficient documentation

## 2021-04-14 DIAGNOSIS — Z20822 Contact with and (suspected) exposure to covid-19: Secondary | ICD-10-CM | POA: Diagnosis not present

## 2021-04-14 DIAGNOSIS — I10 Essential (primary) hypertension: Secondary | ICD-10-CM | POA: Diagnosis not present

## 2021-04-14 DIAGNOSIS — Z79899 Other long term (current) drug therapy: Secondary | ICD-10-CM | POA: Insufficient documentation

## 2021-04-14 DIAGNOSIS — R0602 Shortness of breath: Secondary | ICD-10-CM | POA: Diagnosis present

## 2021-04-14 DIAGNOSIS — N1832 Chronic kidney disease, stage 3b: Secondary | ICD-10-CM | POA: Diagnosis present

## 2021-04-14 DIAGNOSIS — Z794 Long term (current) use of insulin: Secondary | ICD-10-CM | POA: Insufficient documentation

## 2021-04-14 DIAGNOSIS — N183 Chronic kidney disease, stage 3 unspecified: Secondary | ICD-10-CM | POA: Insufficient documentation

## 2021-04-14 DIAGNOSIS — E1122 Type 2 diabetes mellitus with diabetic chronic kidney disease: Secondary | ICD-10-CM | POA: Diagnosis not present

## 2021-04-14 DIAGNOSIS — R188 Other ascites: Secondary | ICD-10-CM

## 2021-04-14 DIAGNOSIS — K7460 Unspecified cirrhosis of liver: Secondary | ICD-10-CM

## 2021-04-14 DIAGNOSIS — E119 Type 2 diabetes mellitus without complications: Secondary | ICD-10-CM

## 2021-04-14 DIAGNOSIS — K529 Noninfective gastroenteritis and colitis, unspecified: Secondary | ICD-10-CM

## 2021-04-14 DIAGNOSIS — R109 Unspecified abdominal pain: Secondary | ICD-10-CM

## 2021-04-14 LAB — CBC
HCT: 30.7 % — ABNORMAL LOW (ref 39.0–52.0)
Hemoglobin: 10.5 g/dL — ABNORMAL LOW (ref 13.0–17.0)
MCH: 31.4 pg (ref 26.0–34.0)
MCHC: 34.2 g/dL (ref 30.0–36.0)
MCV: 91.9 fL (ref 80.0–100.0)
Platelets: 110 10*3/uL — ABNORMAL LOW (ref 150–400)
RBC: 3.34 MIL/uL — ABNORMAL LOW (ref 4.22–5.81)
RDW: 14.2 % (ref 11.5–15.5)
WBC: 4.5 10*3/uL (ref 4.0–10.5)
nRBC: 0 % (ref 0.0–0.2)

## 2021-04-14 LAB — BASIC METABOLIC PANEL
Anion gap: 6 (ref 5–15)
BUN: 32 mg/dL — ABNORMAL HIGH (ref 6–20)
CO2: 21 mmol/L — ABNORMAL LOW (ref 22–32)
Calcium: 8.5 mg/dL — ABNORMAL LOW (ref 8.9–10.3)
Chloride: 105 mmol/L (ref 98–111)
Creatinine, Ser: 1.75 mg/dL — ABNORMAL HIGH (ref 0.61–1.24)
GFR, Estimated: 45 mL/min — ABNORMAL LOW (ref >60)
Glucose, Bld: 140 mg/dL — ABNORMAL HIGH (ref 70–99)
Potassium: 5.1 mmol/L (ref 3.5–5.1)
Sodium: 132 mmol/L — ABNORMAL LOW (ref 135–145)

## 2021-04-14 NOTE — Telephone Encounter (Signed)
Received a message from family that patient is not doing well, is very weak abdominal tight with fluid, not eating and diarrhea. Asking about getting another Paracentesis citing that he had 4 liters drawn off last week and was told he had at least that much more left. Please advise

## 2021-04-14 NOTE — ED Provider Notes (Signed)
[facility]  ____________________________________________   Event Date/Time   First MD Initiated Contact with Patient 04/14/21 2248     (approximate)  I have reviewed the triage vital signs and the nursing notes.   HISTORY  Chief Complaint Shortness of Breath and Nausea    HPI Henry Moore is a 60 y.o. 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 intake.  Patient underwent radiation for his thoracic spine metastases, since this time has had diarrhea.  This has been an ongoing issue for him.  He takes Imodium at home which occasionally helps.  Denies blood in the stool.  Last week he developed increasing abdominal distention and his oncologist arranged for him to have a paracentesis done by interventional radiology, they removed 4 L of fluid.  This is the first time patient is ever had to have a paracentesis.  He felt better for several days and then over the last 2 to 3 days has had recurrent abdominal distention, bloating, fullness and poor p.o. intake.  He has had nausea and several episodes of nonbloody emesis.  He denies fevers or chills.  Does feel that his abdomen is making it difficult for him to breathe due to fullness.  He is scheduled to have surgery at [facility] next week for his spinal metastases.         Past Medical History:  Diagnosis Date   Cough    SINUS DRAINAGE CAUSING COUGHING , REPORTS CLEAR MUCOUS    Diabetes mellitus without complication ([place])    Family history of breast cancer    Family history of colon cancer    Family history of stomach cancer    HTN (hypertension)    Hyperlipemia    Left renal mass    Platelets decreased ([place])    DENIES UNSUAL BLEEDING    PONV (postoperative nausea and vomiting)    Renal cell carcinoma ([place]) 08/04/2019   WBC decreased     Patient Active Problem List    Diagnosis Date Noted   Normocytic anemia 02/19/2020   Other neutropenia ([place]) 02/19/2020   Encounter for antineoplastic immunotherapy 01/01/2020   Port-A-Cath in place 11/21/2019   Genetic testing 10/07/2019   Family history of breast cancer    Family history of colon cancer    Family history of stomach cancer    Thrombocytopenia ([place]) 08/21/2019   Goals of care, counseling/discussion 08/04/2019   Renal cell carcinoma ([place]) 08/04/2019   Chronic kidney disease, stage 3b ([place]) 06/30/2019   History of radical nephrectomy 06/30/2019   Esophageal varices without bleeding ([place]) 06/30/2019   Secondary esophageal varices without bleeding ([place])    Other cirrhosis of liver ([place])    Cellulitis of great toe of right foot 12/31/2018   Cellulitis of toe 12/31/2018   Renal mass, left 10/16/2018   DDD (degenerative disc disease), lumbar 03/12/2017   Degeneration of lumbar intervertebral disc 03/12/2017   Hyperlipemia 05/13/2015   Type 2 diabetes, controlled, with neuropathy ([place]) 05/13/2015   Essential hypertension, benign 05/13/2015   Benign essential hypertension 05/13/2015   Hyperlipidemia 05/13/2015   Type 2 diabetes mellitus ([place]) 05/13/2015   S/P cervical spinal fusion 04/16/2014   Arthrodesis status 04/16/2014    Past Surgical History:  Procedure Laterality Date   ANKLE SURGERY Left    ANTERIOR CERVICAL DECOMP/DISCECTOMY FUSION N/A 04/16/2014   Procedure: ANTERIOR CERVICAL DECOMPRESSION/DISCECTOMY FUSION  (ACDF C5-C7)   (  2 LEVELS)     ;  Surgeon: Melina Schools, MD;  Location: [place];  Service: Orthopedics;  Laterality: N/A;   APPENDECTOMY     BACK SURGERY     ESOPHAGOGASTRODUODENOSCOPY (EGD) WITH PROPOFOL N/A 02/06/2019   Procedure: ESOPHAGOGASTRODUODENOSCOPY (EGD) WITH PROPOFOL;  Surgeon: [name], MD;  Location: [facility] ENDOSCOPY;  Service: Gastroenterology;  Laterality: N/A;   ESOPHAGOGASTRODUODENOSCOPY (EGD) WITH PROPOFOL N/A 03/04/2019   Procedure: ESOPHAGOGASTRODUODENOSCOPY (EGD)  WITH PROPOFOL;  Surgeon: Lin Landsman, MD;  Location: [place];  Service: Gastroenterology;  Laterality: N/A;   ESOPHAGOGASTRODUODENOSCOPY (EGD) WITH PROPOFOL N/A 04/22/2019   Procedure: ESOPHAGOGASTRODUODENOSCOPY (EGD) WITH PROPOFOL;  Surgeon: [name], MD;  Location[facility] ENDOSCOPY;  Service: Gastroenterology;  Laterality: N/A;   FRACTURE SURGERY     HYDROCELE EXCISION     KNEE ARTHROSCOPY Bilateral    LAPAROSCOPIC NEPHRECTOMY, HAND ASSISTED Left 10/16/2018   Procedure: LEFT HAND ASSISTED LAPAROSCOPIC NEPHRECTOMY;  Surgeon: Lucas Mallow, MD;  Location: [facility];  Service: Urology;  Laterality: Left;   NASAL SINUS SURGERY     X 2   PENILE PROSTHESIS IMPLANT  10 YEARS AGO   PORTA CATH INSERTION N/A 11/11/2019   Procedure: PORTA CATH INSERTION;  Surgeon: Katha Cabal, MD;  Location: [place] CV LAB;  Service: Cardiovascular;  Laterality: N/A;   SHOULDER SURGERY Left     Prior to Admission medications   Medication Sig Start Date End Date Taking? Authorizing Provider  ACCU-CHEK AVIVA PLUS test strip CHECK BL00D SUGAR ONCE OR TWICE DAILY. 12/14/16   Johnson, Megan P, DO  amLODipine (NORVASC) 10 MG tablet Take 10 mg by mouth daily. Patient not taking: No sig reported 06/18/18   [provider]  calcium carbonate (OS-CAL - DOSED IN MG OF ELEMENTAL CALCIUM) 1250 (500 Ca) MG tablet Take 1 tablet by mouth.    [provider]  carvedilol (COREG) 25 MG tablet Take 1 tablet (25 mg total) by mouth 2 (two) times daily with a meal. 06/18/18   Crissman, Jeannette How, MD  Cholecalciferol (VITAMIN D3) 125 MCG (5000 UT) CAPS Take 5,000 Units by mouth daily.    [provider]  diclofenac Sodium (VOLTAREN) 1 % GEL Apply 2 g topically 4 (four) times daily. 09/02/20   Earlie Server, MD  glipiZIDE (GLUCOTROL) 5 MG tablet Take 1 tablet (5 mg total) by mouth daily before breakfast. 06/18/18   Guadalupe Maple, MD  hydrochlorothiazide (HYDRODIURIL) 25 MG tablet TAKE 1 TABLET  DAILY. Patient not taking: No sig reported 07/22/18   Volney American, PA-C  insulin aspart (NOVOLOG) 100 UNIT/ML injection Inject into the skin. Inject under the skin 3 (three) times a day before meals Sliding scale, Daughters insulin, using while sugar is very out of wack from cancer treatments, temporary Patient not taking: No sig reported    [provider]  insulin detemir (LEVEMIR) 100 UNIT/ML injection Inject into the skin. Inject 15 units under the skin every night at night, temporary, daughter's insulin Patient not taking: No sig reported    [provider]  insulin lispro (INSULIN LISPRO) 100 UNIT/ML KwikPen Junior Inject into the skin 3 (three) times daily as needed (Above 250). Sliding scale Patient not taking: No sig reported    [provider]  loperamide (IMODIUM A-D) 2 MG tablet Take 2 mg by mouth 4 (four) times daily as needed for diarrhea or loose stools.    [provider]  losartan (COZAAR) 100 MG tablet Take 1 tablet by  mouth daily. 07/22/18   Volney American, PA-C  lovastatin (MEVACOR) 40 MG tablet Take 1 tablet (40 mg total) by mouth at bedtime. 05/04/19   Guadalupe Maple, MD  metFORMIN (GLUCOPHAGE-XR) 500 MG 24 hr tablet Take 500 mg by mouth 2 (two) times daily. 1 QAM, 1 QPM 04/02/19   [provider]  ofloxacin (OCUFLOX) 0.3 % ophthalmic solution Dry eye Patient not taking: No sig reported 10/05/20   [provider]  predniSONE (DELTASONE) 10 MG tablet Take 1 tablet (10 mg total) by mouth daily with breakfast. Patient not taking: No sig reported 02/15/21   Earlie Server, MD  predniSONE (DELTASONE) 20 MG tablet Take 1 tablet (20 mg total) by mouth daily with breakfast. Patient not taking: No sig reported 02/11/21   Earlie Server, MD  prochlorperazine (COMPAZINE) 10 MG tablet Take 1 tablet (10 mg total) by mouth every 6 (six) hours as needed for nausea or vomiting. 08/07/19   Jacquelin Hawking, NP  vitamin B-12  (CYANOCOBALAMIN) 1000 MCG tablet Take 1 tablet (1,000 mcg total) by mouth daily. 01/01/20   Earlie Server, MD    Allergies Codeine and Mucinex [guaifenesin er]  Family History  Problem Relation Age of Onset   Diabetes Mother    Cancer Mother        neuroendocrine cancer   Cancer Father        neuroendocrine cancer of meninges   Cancer Maternal Grandmother        tomach dx 51   Alzheimer's disease Paternal Grandmother    Lung cancer Paternal Grandfather    Lung cancer Maternal Aunt    Colon cancer Maternal Uncle    Breast cancer Paternal Aunt        both dx 49s   Ovarian cancer Other        dx 30   Breast cancer Cousin     Social History Social History   Tobacco Use   Smoking status: Former    Packs/day: 0.25    Years: 20.00    Pack years: 5.00    Types: Cigarettes    Quit date: 2015    Years since quitting: 7.7   Smokeless tobacco: Never  Vaping Use   Vaping Use: Never used  Substance Use Topics   Alcohol use: Not Currently    Alcohol/week: 0.0 standard drinks    Comment: no alchol in 1 year   Drug use: No    Review of Systems   Review of Systems  Constitutional:  Positive for appetite change. Negative for chills, fatigue and fever.  Respiratory:  Positive for shortness of breath. Negative for cough.   Cardiovascular:  Negative for chest pain, palpitations and leg swelling.  Gastrointestinal:  Positive for abdominal distention, diarrhea, nausea and vomiting. Negative for abdominal pain.  Genitourinary:  Negative for dysuria.  All other systems reviewed and are negative.  Physical Exam Updated Vital Signs BP 125/76   Pulse 85   Temp 98 F (36.7 C) (Oral)   Resp 18   Ht '6\' 1"'$  (1.854 m)   SpO2 98%   BMI 33.75 kg/m   Physical Exam Vitals and nursing note reviewed.  Constitutional:      General: He is not in acute distress.    Appearance: Normal appearance.  HENT:     Head: Normocephalic and atraumatic.  Eyes:     General: No scleral icterus.     Conjunctiva/sclera: Conjunctivae normal.  Cardiovascular:     Rate and Rhythm: Normal rate.  Pulmonary:     Effort: Pulmonary effort is normal. No respiratory distress.     Breath sounds: Normal breath sounds. No wheezing.     Comments: Pt is not in any respiratory distress Abdominal:     Comments: Abdomen is distended, but nontender  Musculoskeletal:        General: No deformity or signs of injury.     Cervical back: Normal range of motion.     Right lower leg: No edema.     Left lower leg: No edema.  Skin:    Coloration: Skin is not jaundiced or pale.  Neurological:     General: No focal deficit present.     Mental Status: He is alert and oriented to person, place, and time. Mental status is at baseline.  Psychiatric:        Mood and Affect: Mood normal.        Behavior: Behavior normal.     LABS (all labs ordered are listed, but only abnormal results are displayed)  Labs Reviewed  BASIC METABOLIC PANEL - Abnormal; Notable for the following components:      Result Value   Sodium 132 (*)    CO2 21 (*)    Glucose, Bld 140 (*)    BUN 32 (*)    Creatinine, Ser 1.75 (*)    Calcium 8.5 (*)    GFR, Estimated 45 (*)    All other components within normal limits  CBC - Abnormal; Notable for the following components:   RBC 3.34 (*)    Hemoglobin 10.5 (*)    HCT 30.7 (*)    Platelets 110 (*)    All other components within normal limits  RESP PANEL BY RT-PCR (FLU A&B, COVID) ARPGX2  URINALYSIS, COMPLETE (UACMP) WITH MICROSCOPIC  HEPATIC FUNCTION PANEL  PROTIME-INR  CBG MONITORING, ED  TROPONIN I (HIGH SENSITIVITY)   ____________________________________________  EKG Normal sinus rhythm, normal axis, normal intervals, no acute ischemic changes   RADIOLOGY I, Madelin Headings, personally viewed and evaluated these images (plain radiographs) as part of my medical decision making, as well as reviewing the written report by the radiologist.  ED MD interpretation: I  reviewed the x-ray of the chest which does not show any acute cardiopulmonary process    ____________________________________________   PROCEDURES  Procedure(s) performed (including Critical Care):  Procedures   ____________________________________________   INITIAL IMPRESSION / ASSESSMENT AND PLAN / ED COURSE    60 year old male with cirrhosis and renal cell carcinoma that is metastatic presents with abdominal distention, bloating nausea vomiting and fullness.  His vital signs within normal limits.  He does have a distended abdomen but is nontender.  My suspicion for SBP is low.  Recently had his first paracentesis about 6 days ago with removal of 4 L of fluid.  Otherwise does not appear decompensated from a cirrhosis standpoint, he has normal mental status no evidence of GI bleeding.  Will check basic labs and perform a bedside ultrasound to assess the degree of ascites.  If there is a sizable pocket to drain he will likely benefit from either admission for drainage versus early IR follow-up.  On bedside ultrasound patient has a significant amount of ascites.  Patient has not been seen by GI in over a year and has not really had the proper evaluation of his ascites nor is he optimized medically at this time.  Given this is likely progression of his cirrhosis I do feel that he would benefit from admission for  an IR paracentesis and for work-up.  My suspicion again for SBP is low do not feel that he needs a diagnostic tap at this time.  He is not in any respiratory distress so emergent paracentesis is not needed.      ____________________________________________   FINAL CLINICAL IMPRESSION(S) / ED DIAGNOSES  Final diagnoses:  Other ascites     ED Discharge Orders     None        Note:  This document was prepared using Dragon voice recognition software and may include unintentional dictation errors.    Rada Hay, MD 04/15/21 [phone]

## 2021-04-14 NOTE — ED Triage Notes (Signed)
Pt to ER via POV. Pt reports cirrhosis, on 9/9 had approx 4L of fluid removed via paracentesis. Pt reports that his abdomen has gradually become more distended, pt reports the fluid build up is making him gradually become more short of breath. Pt reports also being unable to eat or drink, is constantly nauseated. Last meal was at lunch yesterday. Pt also with colitis and diarrhea x4 months. Pt has also been unable to take his blood pressure medication due to dizziness and dehydration.   Pt was doing immunotherapy for metastatic cancer, reports having a surgery scheduled next week at [place].

## 2021-04-14 NOTE — Telephone Encounter (Signed)
Patient infomred per [facility] ORDER Dr Tasia Catchings that he needs to go to the ER. He stated alright, thank you

## 2021-04-15 ENCOUNTER — Observation Stay: Payer: BC Managed Care – PPO

## 2021-04-15 ENCOUNTER — Encounter: Payer: Self-pay | Admitting: Obstetrics and Gynecology

## 2021-04-15 DIAGNOSIS — K7460 Unspecified cirrhosis of liver: Secondary | ICD-10-CM

## 2021-04-15 DIAGNOSIS — E871 Hypo-osmolality and hyponatremia: Secondary | ICD-10-CM

## 2021-04-15 DIAGNOSIS — N1832 Chronic kidney disease, stage 3b: Secondary | ICD-10-CM

## 2021-04-15 DIAGNOSIS — C649 Malignant neoplasm of unspecified kidney, except renal pelvis: Secondary | ICD-10-CM | POA: Diagnosis not present

## 2021-04-15 DIAGNOSIS — R188 Other ascites: Secondary | ICD-10-CM

## 2021-04-15 DIAGNOSIS — K529 Noninfective gastroenteritis and colitis, unspecified: Secondary | ICD-10-CM | POA: Diagnosis not present

## 2021-04-15 LAB — BASIC METABOLIC PANEL
Anion gap: 9 (ref 5–15)
BUN: 34 mg/dL — ABNORMAL HIGH (ref 6–20)
CO2: 19 mmol/L — ABNORMAL LOW (ref 22–32)
Calcium: 8.1 mg/dL — ABNORMAL LOW (ref 8.9–10.3)
Chloride: 107 mmol/L (ref 98–111)
Creatinine, Ser: 1.48 mg/dL — ABNORMAL HIGH (ref 0.61–1.24)
GFR, Estimated: 55 mL/min — ABNORMAL LOW (ref >60)
Glucose, Bld: 124 mg/dL — ABNORMAL HIGH (ref 70–99)
Potassium: 4.5 mmol/L (ref 3.5–5.1)
Sodium: 135 mmol/L (ref 135–145)

## 2021-04-15 LAB — CBG MONITORING, ED
Glucose-Capillary: 107 mg/dL — ABNORMAL HIGH (ref 70–99)
Glucose-Capillary: 116 mg/dL — ABNORMAL HIGH (ref 70–99)
Glucose-Capillary: 121 mg/dL — ABNORMAL HIGH (ref 70–99)
Glucose-Capillary: 128 mg/dL — ABNORMAL HIGH (ref 70–99)

## 2021-04-15 LAB — CBC
HCT: 30.1 % — ABNORMAL LOW (ref 39.0–52.0)
Hemoglobin: 9.9 g/dL — ABNORMAL LOW (ref 13.0–17.0)
MCH: 30.1 pg (ref 26.0–34.0)
MCHC: 32.9 g/dL (ref 30.0–36.0)
MCV: 91.5 fL (ref 80.0–100.0)
Platelets: 99 10*3/uL — ABNORMAL LOW (ref 150–400)
RBC: 3.29 MIL/uL — ABNORMAL LOW (ref 4.22–5.81)
RDW: 14.1 % (ref 11.5–15.5)
WBC: 4.8 10*3/uL (ref 4.0–10.5)
nRBC: 0 % (ref 0.0–0.2)

## 2021-04-15 LAB — URINALYSIS, COMPLETE (UACMP) WITH MICROSCOPIC
Bacteria, UA: NONE SEEN
Bilirubin Urine: NEGATIVE
Glucose, UA: NEGATIVE mg/dL
Ketones, ur: NEGATIVE mg/dL
Leukocytes,Ua: NEGATIVE
Nitrite: NEGATIVE
Protein, ur: NEGATIVE mg/dL
Specific Gravity, Urine: 1.015 (ref 1.005–1.030)
pH: 5 (ref 5.0–8.0)

## 2021-04-15 LAB — HEPATIC FUNCTION PANEL
ALT: 23 U/L (ref 0–44)
AST: 36 U/L (ref 15–41)
Albumin: 2.9 g/dL — ABNORMAL LOW (ref 3.5–5.0)
Alkaline Phosphatase: 86 U/L (ref 38–126)
Bilirubin, Direct: 0.2 mg/dL (ref 0.0–0.2)
Indirect Bilirubin: 0.8 mg/dL (ref 0.3–0.9)
Total Bilirubin: 1 mg/dL (ref 0.3–1.2)
Total Protein: 6.4 g/dL — ABNORMAL LOW (ref 6.5–8.1)

## 2021-04-15 LAB — PROTIME-INR
INR: 1.3 — ABNORMAL HIGH (ref 0.8–1.2)
Prothrombin Time: 16.4 seconds — ABNORMAL HIGH (ref 11.4–15.2)

## 2021-04-15 LAB — HEMOGLOBIN A1C
Hgb A1c MFr Bld: 5.2 % (ref 4.8–5.6)
Mean Plasma Glucose: 102.54 mg/dL

## 2021-04-15 LAB — BODY FLUID CELL COUNT WITH DIFFERENTIAL
Eos, Fluid: 0 %
Lymphs, Fluid: 29 %
Monocyte-Macrophage-Serous Fluid: 57 %
Neutrophil Count, Fluid: 13 %
Other Cells, Fluid: 1 %
Total Nucleated Cell Count, Fluid: 297 cu mm

## 2021-04-15 LAB — CYTOLOGY - NON PAP

## 2021-04-15 LAB — ALBUMIN, PLEURAL OR PERITONEAL FLUID: Albumin, Fluid: <1 g/dL

## 2021-04-15 LAB — RESP PANEL BY RT-PCR (FLU A&B, COVID) ARPGX2
Influenza A by PCR: NEGATIVE
Influenza B by PCR: NEGATIVE
SARS Coronavirus 2 by RT PCR: NEGATIVE

## 2021-04-15 LAB — LACTATE DEHYDROGENASE, PLEURAL OR PERITONEAL FLUID: LD, Fluid: 33 U/L — ABNORMAL HIGH (ref 3–23)

## 2021-04-15 LAB — TROPONIN I (HIGH SENSITIVITY): Troponin I (High Sensitivity): 5 ng/L (ref <18)

## 2021-04-15 LAB — HIV ANTIBODY (ROUTINE TESTING W REFLEX): HIV Screen 4th Generation wRfx: NONREACTIVE

## 2021-04-15 MED ORDER — SODIUM CHLORIDE 0.9% FLUSH
10.0000 mL | Freq: Two times a day (BID) | INTRAVENOUS | Status: DC
  Administered 2021-04-15 – 2021-04-16 (×3): 10 mL

## 2021-04-15 MED ORDER — SODIUM CHLORIDE 0.9 % IV BOLUS
500.0000 mL | Freq: Once | INTRAVENOUS | Status: AC
  Administered 2021-04-15: 500 mL via INTRAVENOUS

## 2021-04-15 MED ORDER — SPIRONOLACTONE 25 MG PO TABS
100.0000 mg | ORAL_TABLET | Freq: Every day | ORAL | Status: DC
  Administered 2021-04-15 – 2021-04-16 (×2): 100 mg via ORAL
  Filled 2021-04-15 (×2): qty 4

## 2021-04-15 MED ORDER — ONDANSETRON HCL 4 MG/2ML IJ SOLN
4.0000 mg | Freq: Once | INTRAMUSCULAR | Status: AC
  Administered 2021-04-15: 4 mg via INTRAVENOUS
  Filled 2021-04-15: qty 2

## 2021-04-15 MED ORDER — LOPERAMIDE HCL 2 MG PO CAPS
2.0000 mg | ORAL_CAPSULE | ORAL | Status: DC | PRN
  Administered 2021-04-15 – 2021-04-16 (×3): 2 mg via ORAL
  Filled 2021-04-15 (×3): qty 1

## 2021-04-15 MED ORDER — ONDANSETRON HCL 4 MG/2ML IJ SOLN
4.0000 mg | Freq: Four times a day (QID) | INTRAMUSCULAR | Status: DC | PRN
  Filled 2021-04-15: qty 2

## 2021-04-15 MED ORDER — LOPERAMIDE HCL 2 MG PO CAPS: 2.0000 mg | ORAL_CAPSULE | Freq: Four times a day (QID) | ORAL | Status: DC | PRN

## 2021-04-15 MED ORDER — SODIUM CHLORIDE 0.9 % IV SOLN: INTRAVENOUS | Status: DC

## 2021-04-15 MED ORDER — INSULIN ASPART 100 UNIT/ML IJ SOLN
0.0000 [IU] | Freq: Three times a day (TID) | INTRAMUSCULAR | Status: DC
  Administered 2021-04-16: 4 [IU] via SUBCUTANEOUS
  Filled 2021-04-15: qty 1

## 2021-04-15 MED ORDER — ENOXAPARIN SODIUM 60 MG/0.6ML IJ SOSY: 0.5000 mg/kg | PREFILLED_SYRINGE | Freq: Every day | INTRAMUSCULAR | Status: DC

## 2021-04-15 MED ORDER — CHLORHEXIDINE GLUCONATE CLOTH 2 % EX PADS
6.0000 | MEDICATED_PAD | Freq: Every day | CUTANEOUS | Status: DC
  Administered 2021-04-16: 6 via TOPICAL
  Filled 2021-04-15 (×2): qty 6

## 2021-04-15 MED ORDER — FUROSEMIDE 40 MG PO TABS
40.0000 mg | ORAL_TABLET | Freq: Every day | ORAL | Status: DC
  Administered 2021-04-15 – 2021-04-16 (×2): 40 mg via ORAL
  Filled 2021-04-15 (×2): qty 1

## 2021-04-15 MED ORDER — INSULIN ASPART 100 UNIT/ML IJ SOLN
0.0000 [IU] | Freq: Every day | INTRAMUSCULAR | Status: DC
  Administered 2021-04-15: 0 [IU] via SUBCUTANEOUS

## 2021-04-15 MED ORDER — ACETAMINOPHEN 325 MG PO TABS: 650.0000 mg | ORAL_TABLET | Freq: Four times a day (QID) | ORAL | Status: DC | PRN

## 2021-04-15 MED ORDER — ACETAMINOPHEN 650 MG RE SUPP: 650.0000 mg | Freq: Four times a day (QID) | RECTAL | Status: DC | PRN

## 2021-04-15 MED ORDER — ONDANSETRON HCL 4 MG PO TABS: 4.0000 mg | ORAL_TABLET | Freq: Four times a day (QID) | ORAL | Status: DC | PRN

## 2021-04-15 MED ORDER — SODIUM CHLORIDE 0.9% FLUSH: 10.0000 mL | INTRAVENOUS | Status: DC | PRN

## 2021-04-15 NOTE — Consult Note (Signed)
Hematology/Oncology Consult note [facility] [phone] [phone]  Patient Care Team: Albina Billet, MD as PCP - General (Internal Medicine) Earlie Server, MD as Consulting Physician (Hematology and Oncology)   Name of the patient: Henry Moore  [id number]  1961-04-04   Date of visit: 04/15/21 REASON FOR COSULTATION:  Abdominal bloating, weakness, nausea and diarrhea.  History of presenting illness-  60 y.o. 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   He has metastatic RCC and has been relatively well controlled with nivolumab, except T9 vertebral body lesion worsening, despite RT.  He is scheduled to have kyphoplasty by [facility] next week.  Chronic intermittent diarrhea was felt to be due to immunotherapy side effects, previous C diff and GI panel were negative in July 2022. He was previously treated with prednisone and he reports not feeling well while on steroid, on lomodium PRN.   He had US paracentesis on 04/08/2021 with removal of 4L of fluid, and cytology was negative.  He feels worsening of abdominal distention for the past few days. No fever. He has nausea due to abdomen distention.  S/p paracentesis with 5L fluid removed.he has been evaluated by gastroenterology Dr.Thahiliani.   Review of Systems  Constitutional:  Positive for appetite change and fatigue. Negative for chills, fever and unexpected weight change.  HENT:   Negative for hearing loss and voice change.   Eyes:  Negative for eye problems and icterus.  Respiratory:  Negative for chest tightness, cough and shortness of breath.   Cardiovascular:  Negative for chest pain and leg swelling.  Gastrointestinal:  Positive for abdominal distention and diarrhea. Negative for abdominal pain.  Endocrine: Negative  for hot flashes.  Genitourinary:  Negative for difficulty urinating, dysuria and frequency.   Musculoskeletal:  Positive for back pain. Negative for arthralgias.  Skin:  Negative for itching and rash.  Neurological:  Negative for light-headedness and numbness.  Hematological:  Negative for adenopathy. Does not bruise/bleed easily.  Psychiatric/Behavioral:  Negative for confusion.    Allergies  Allergen Reactions   Codeine Itching   Mucinex [Guaifenesin Er] Anxiety    anxiety    Patient Active Problem List   Diagnosis Date Noted   Metastatic renal cell carcinoma (HCC) 04/15/2021   Chronic diarrhea 04/15/2021   Ascites 04/15/2021   Normocytic anemia 02/19/2020   Other neutropenia ([place]) 02/19/2020   Encounter for antineoplastic immunotherapy 01/01/2020   Port-A-Cath in place 11/21/2019   Genetic testing 10/07/2019   Family history of breast cancer    Family history of colon cancer    Family history of stomach cancer    Thrombocytopenia ([place]) 08/21/2019   Goals of care, counseling/discussion 08/04/2019   Renal cell carcinoma ([place]) 08/04/2019   Chronic kidney disease, stage 3b ([place]) 06/30/2019   History of radical nephrectomy 06/30/2019   Esophageal varices without bleeding ([place]) 06/30/2019   Secondary esophageal varices without bleeding ([place])    Cirrhosis of liver with ascites ([place])    Cellulitis of great toe of right foot 12/31/2018   Cellulitis of toe 12/31/2018   Renal mass, left 10/16/2018   DDD (degenerative disc disease), lumbar 03/12/2017   Degeneration of lumbar intervertebral disc 03/12/2017   Hyperlipemia 05/13/2015   Type 2 diabetes, controlled, with neuropathy ([place]) 05/13/2015   Essential hypertension, benign 05/13/2015   Benign  essential hypertension 05/13/2015   Hyperlipidemia 05/13/2015   Type 2 diabetes mellitus ([place]) 05/13/2015   S/P cervical spinal fusion 04/16/2014   Arthrodesis status 04/16/2014     Past Medical History:  Diagnosis Date   Cough     SINUS DRAINAGE CAUSING COUGHING , REPORTS CLEAR MUCOUS    Diabetes mellitus without complication ([place])    Family history of breast cancer    Family history of colon cancer    Family history of stomach cancer    HTN (hypertension)    Hyperlipemia    Left renal mass    Platelets decreased ([place])    DENIES UNSUAL BLEEDING    PONV (postoperative nausea and vomiting)    Renal cell carcinoma ([place]) 08/04/2019   WBC decreased      Past Surgical History:  Procedure Laterality Date   ANKLE SURGERY Left    ANTERIOR CERVICAL DECOMP/DISCECTOMY FUSION N/A 04/16/2014   Procedure: ANTERIOR CERVICAL DECOMPRESSION/DISCECTOMY FUSION  (ACDF C5-C7)   (2 LEVELS)     ;  Surgeon: Melina Schools, MD;  Location: [facility];  Service: Orthopedics;  Laterality: N/A;   APPENDECTOMY     BACK SURGERY     ESOPHAGOGASTRODUODENOSCOPY (EGD) WITH PROPOFOL N/A 02/06/2019   Procedure: ESOPHAGOGASTRODUODENOSCOPY (EGD) WITH PROPOFOL;  Surgeon: [name], MD;  Location: [facility] ENDOSCOPY;  Service: Gastroenterology;  Laterality: N/A;   ESOPHAGOGASTRODUODENOSCOPY (EGD) WITH PROPOFOL N/A 03/04/2019   Procedure: ESOPHAGOGASTRODUODENOSCOPY (EGD) WITH PROPOFOL;  Surgeon: Lin Landsman, MD;  Location: [facility];  Service: Gastroenterology;  Laterality: N/A;   ESOPHAGOGASTRODUODENOSCOPY (EGD) WITH PROPOFOL N/A 04/22/2019   Procedure: ESOPHAGOGASTRODUODENOSCOPY (EGD) WITH PROPOFOL;  Surgeon: [name], MD;  Location: [facility]OSCOPY;  Service: Gastroenterology;  Laterality: N/A;   FRACTURE SURGERY     HYDROCELE EXCISION     KNEE ARTHROSCOPY Bilateral    LAPAROSCOPIC NEPHRECTOMY, HAND ASSISTED Left 10/16/2018   Procedure: LEFT HAND ASSISTED LAPAROSCOPIC NEPHRECTOMY;  Surgeon: Lucas Mallow, MD;  Location: [facility];  Service: Urology;  Laterality: Left;   NASAL SINUS SURGERY     X 2   PENILE PROSTHESIS IMPLANT  10 YEARS AGO   PORTA CATH INSERTION N/A 11/11/2019   Procedure: PORTA CATH INSERTION;  Surgeon: Katha Cabal, MD;   Location: [facility];  Service: Cardiovascular;  Laterality: N/A;   SHOULDER SURGERY Left     Social History   Socioeconomic History   Marital status: Married    Spouse name: Not on file   Number of children: Not on file   Years of education: Not on file   Highest education level: Not on file  Occupational History   Not on file  Tobacco Use   Smoking status: Former    Packs/day: 0.25    Years: 20.00    Pack years: 5.00    Types: Cigarettes    Quit date: 2015    Years since quitting: 7.7   Smokeless tobacco: Never  Vaping Use   Vaping Use: Never used  Substance and Sexual Activity   Alcohol use: Not Currently    Alcohol/week: 0.0 standard drinks    Comment: no alchol in 1 year   Drug use: No   Sexual activity: Not on file  Other Topics Concern   Not on file  Social History Narrative   Not on file   Social Determinants of Health   Financial Resource Strain: Not on file  Food Insecurity: Not on file  Transportation Needs: Not on file  Physical Activity: Not on  file  Stress: Not on file  Social Connections: Not on file  Intimate Partner Violence: Not on file     Family History  Problem Relation Age of Onset   Diabetes Mother    Cancer Mother        neuroendocrine cancer   Cancer Father        neuroendocrine cancer of meninges   Cancer Maternal Grandmother        tomach dx 54   Alzheimer's disease Paternal Grandmother    Lung cancer Paternal Grandfather    Lung cancer Maternal Aunt    Colon cancer Maternal Uncle    Breast cancer Paternal Aunt        both dx 59s   Ovarian cancer Other        dx 49   Breast cancer Cousin      Current Facility-Administered Medications:    0.9 %  sodium chloride infusion, , Intravenous, Continuous, Athena Masse, MD, Last Rate: 100 mL/hr at 04/15/21 0457, New Bag/Given (Non-Interop) at 04/15/21 0457   acetaminophen (TYLENOL) tablet 650 mg, 650 mg, Oral, Q6H PRN **OR** acetaminophen (TYLENOL) suppository 650  mg, 650 mg, Rectal, Q6H PRN, Athena Masse, MD   Chlorhexidine Gluconate Cloth 2 % PADS 6 each, 6 each, Topical, Daily, Wouk, Ailene Rud, MD   [START ON 04/16/2021] enoxaparin (LOVENOX) injection 57.5 mg, 0.5 mg/kg, Subcutaneous, QHS, Wouk, Ailene Rud, MD   furosemide (LASIX) tablet 40 mg, 40 mg, Oral, Daily, Wouk, Ailene Rud, MD, 40 mg at 04/15/21 0856   insulin aspart (novoLOG) injection 0-20 Units, 0-20 Units, Subcutaneous, TID WC, Athena Masse, MD   insulin aspart (novoLOG) injection 0-5 Units, 0-5 Units, Subcutaneous, QHS, Athena Masse, MD, 0 Units at 04/15/21 0309   loperamide (IMODIUM) capsule 2 mg, 2 mg, Oral, PRN, Athena Masse, MD, 2 mg at 04/15/21 0856   ondansetron (ZOFRAN) tablet 4 mg, 4 mg, Oral, Q6H PRN **OR** ondansetron (ZOFRAN) injection 4 mg, 4 mg, Intravenous, Q6H PRN, Athena Masse, MD   sodium chloride flush (NS) 0.9 % injection 10-40 mL, 10-40 mL, Intracatheter, Q12H, Wouk, Ailene Rud, MD, 10 mL at 04/15/21 [id number]   sodium chloride flush (NS) 0.9 % injection 10-40 mL, 10-40 mL, Intracatheter, PRN, Wouk, Ailene Rud, MD   spironolactone (ALDACTONE) tablet 100 mg, 100 mg, Oral, Daily, Wouk, Ailene Rud, MD, 100 mg at 04/15/21 [id number]  Current Outpatient Medications:    calcium carbonate (OS-CAL - DOSED IN MG OF ELEMENTAL CALCIUM) 1250 (500 Ca) MG tablet, Take 1 tablet by mouth., Disp: , Rfl:    carvedilol (COREG) 25 MG tablet, Take 1 tablet (25 mg total) by mouth 2 (two) times daily with a meal., Disp: 180 tablet, Rfl: 3   Cholecalciferol (VITAMIN D3) 125 MCG (5000 UT) CAPS, Take 5,000 Units by mouth daily., Disp: , Rfl:    glipiZIDE (GLUCOTROL) 5 MG tablet, Take 1 tablet (5 mg total) by mouth daily before breakfast., Disp: 90 tablet, Rfl: 3   loperamide (IMODIUM A-D) 2 MG tablet, Take 2 mg by mouth 4 (four) times daily as needed for diarrhea or loose stools., Disp: , Rfl:    losartan (COZAAR) 100 MG tablet, Take 1 tablet by mouth daily., Disp: 90 tablet, Rfl:  0   lovastatin (MEVACOR) 40 MG tablet, Take 1 tablet (40 mg total) by mouth at bedtime., Disp: 60 tablet, Rfl: 0   metFORMIN (GLUCOPHAGE-XR) 500 MG 24 hr tablet, Take 500 mg by mouth 2 (two) times daily. 1  QAM, 1 QPM, Disp: , Rfl:    prochlorperazine (COMPAZINE) 10 MG tablet, Take 1 tablet (10 mg total) by mouth every 6 (six) hours as needed for nausea or vomiting., Disp: 30 tablet, Rfl: 0   vitamin B-12 (CYANOCOBALAMIN) 1000 MCG tablet, Take 1 tablet (1,000 mcg total) by mouth daily., Disp: 90 tablet, Rfl: 0   ACCU-CHEK AVIVA PLUS test strip, CHECK BL00D SUGAR ONCE OR TWICE DAILY., Disp: 100 each, Rfl: PRN   amLODipine (NORVASC) 10 MG tablet, Take 10 mg by mouth daily. (Patient not taking: No sig reported), Disp: , Rfl:    diclofenac Sodium (VOLTAREN) 1 % GEL, Apply 2 g topically 4 (four) times daily. (Patient not taking: No sig reported), Disp: 100 g, Rfl: 2   hydrochlorothiazide (HYDRODIURIL) 25 MG tablet, TAKE 1 TABLET DAILY. (Patient not taking: No sig reported), Disp: 90 tablet, Rfl: 0   insulin aspart (NOVOLOG) 100 UNIT/ML injection, Inject into the skin. Inject under the skin 3 (three) times a day before meals Sliding scale, Daughters insulin, using while sugar is very out of wack from cancer treatments, temporary (Patient not taking: No sig reported), Disp: , Rfl:    insulin lispro (INSULIN LISPRO) 100 UNIT/ML KwikPen Junior, Inject into the skin 3 (three) times daily as needed (Above 250). Sliding scale (Patient not taking: No sig reported), Disp: , Rfl:    ofloxacin (OCUFLOX) 0.3 % ophthalmic solution, Dry eye (Patient not taking: No sig reported), Disp: , Rfl:    predniSONE (DELTASONE) 10 MG tablet, Take 1 tablet (10 mg total) by mouth daily with breakfast. (Patient not taking: No sig reported), Disp: 3 tablet, Rfl: 0   predniSONE (DELTASONE) 20 MG tablet, Take 1 tablet (20 mg total) by mouth daily with breakfast. (Patient not taking: No sig reported), Disp: 3 tablet, Rfl:  0  Facility-Administered Medications Ordered in Other Encounters:    sodium chloride flush (NS) 0.9 % injection 10 mL, 10 mL, Intravenous, PRN, Earlie Server, MD, 10 mL at 01/22/20 0831   Physical exam:  Vitals:   04/15/21 1022 04/15/21 1101 04/15/21 1120 04/15/21 1125  BP: 138/82 136/72 (!) 141/95   Pulse: 87 81  84  Resp:  18    Temp:      TempSrc:      SpO2: 99% 98%  100%  Height:       Physical Exam Constitutional:      General: He is not in acute distress.    Appearance: He is not diaphoretic.  HENT:     Head: Normocephalic and atraumatic.     Nose: Nose normal.     Mouth/Throat:     Pharynx: No oropharyngeal exudate.  Eyes:     General: No scleral icterus.    Pupils: Pupils are equal, round, and reactive to light.  Cardiovascular:     Rate and Rhythm: Normal rate and regular rhythm.     Heart sounds: No murmur heard. Pulmonary:     Effort: Pulmonary effort is normal. No respiratory distress.     Breath sounds: No rales.  Chest:     Chest wall: No tenderness.  Abdominal:     Palpations: Abdomen is soft.     Tenderness: There is no abdominal tenderness.  Musculoskeletal:        General: Normal range of motion.     Cervical back: Normal range of motion and neck supple.  Skin:    General: Skin is warm and dry.     Findings: No erythema.  Neurological:  Mental Status: He is alert and oriented to person, place, and time.     Cranial Nerves: No cranial nerve deficit.     Motor: No abnormal muscle tone.     Coordination: Coordination normal.  Psychiatric:        Mood and Affect: Affect normal.        CMP Latest Ref Rng & Units 04/15/2021  Glucose 70 - 99 mg/dL 124(H)  BUN 6 - 20 mg/dL 34(H)  Creatinine 0.61 - 1.24 mg/dL 1.48(H)  Sodium 135 - 145 mmol/L 135  Potassium 3.5 - 5.1 mmol/L 4.5  Chloride 98 - 111 mmol/L 107  CO2 22 - 32 mmol/L 19(L)  Calcium 8.9 - 10.3 mg/dL 8.1(L)  Total Protein 6.5 - 8.1 g/dL -  Total Bilirubin 0.3 - 1.2 mg/dL -  Alkaline  Phos 38 - 126 U/L -  AST 15 - 41 U/L -  ALT 0 - 44 U/L -   CBC Latest Ref Rng & Units 04/15/2021  WBC 4.0 - 10.5 K/uL 4.8  Hemoglobin 13.0 - 17.0 g/dL 9.9(L)  Hematocrit 39.0 - 52.0 % 30.1(L)  Platelets 150 - 400 K/uL 99(L)    RADIOGRAPHIC STUDIES: I have personally reviewed the radiological images as listed and agreed with the findings in the report. US Paracentesis  Result Date: 04/15/2021 INDICATION: Recurrent ascites secondary to cirrhosis. Request for diagnostic and therapeutic paracentesis. EXAM: ULTRASOUND GUIDED PARACENTESIS MEDICATIONS: 1% lidocaine 10 mL COMPLICATIONS: None immediate. PROCEDURE: Informed written consent was obtained from the patient after a discussion of the risks, benefits and alternatives to treatment. A timeout was performed prior to the initiation of the procedure. Initial ultrasound scanning demonstrates a large amount of ascites within the right lateral abdomen. The right lateral abdomen was prepped and draped in the usual sterile fashion. 1% lidocaine was used for local anesthesia. Following this, a 6 Fr Safe-T-Centesis catheter was introduced. An ultrasound image was saved for documentation purposes. The paracentesis was performed. The catheter was removed and a dressing was applied. The patient tolerated the procedure well without immediate post procedural complication. FINDINGS: A total of approximately 5 L of clear yellow fluid was removed. Samples were sent to the laboratory as requested by the clinical team. IMPRESSION: Successful ultrasound-guided paracentesis yielding 5 liters of peritoneal fluid. Read by: Gareth Eagle, PA-C Electronically Signed   By: Michaelle Birks M.D.   On: 04/15/2021 12:19   US Paracentesis  Result Date: 04/08/2021 INDICATION: Symptomatic ascites. EXAM: ULTRASOUND GUIDED PARACENTESIS MEDICATIONS: None. COMPLICATIONS: None immediate. PROCEDURE: Informed written consent was obtained from the patient after a discussion of the risks, benefits  and alternatives to treatment. A timeout was performed prior to the initiation of the procedure. Initial ultrasound scanning demonstrates a large amount of ascites within the right lower abdominal quadrant. The right lower abdomen was prepped and draped in the usual sterile fashion. 1% lidocaine was used for local anesthesia. Following this, a 8 Fr Safe-T-Centesis catheter was introduced. An ultrasound image was saved for documentation purposes. The paracentesis was performed. The catheter was removed and a dressing was applied. The patient tolerated the procedure well without immediate post procedural complication. Patient did not receive intravenous albumin FINDINGS: A total of approximately 4.0 of serous ascitic fluid was removed. Samples were sent to the laboratory as requested by the clinical team. IMPRESSION: Successful ultrasound-guided paracentesis yielding 4.0 liters of ascites. Michaelle Birks, MD [facility] Radiology Electronically Signed   By: Michaelle Birks M.D.   On: 04/08/2021  18:21   DG Chest Portable 1 View  Result Date: 04/14/2021 CLINICAL DATA:  Short of breath, cirrhosis, abdominal distension EXAM: PORTABLE CHEST 1 VIEW COMPARISON:  09/05/2018 FINDINGS: Single frontal view of the chest demonstrates right chest wall port via internal jugular approach tip overlying superior vena cava. The cardiac silhouette is unremarkable. No airspace disease, effusion, or pneumothorax. IMPRESSION: 1. No acute intrathoracic process. Electronically Signed   By: Randa Ngo M.D.   On: 04/14/2021 23:46   MR TOTAL SPINE METS SCREENING  Result Date: 04/12/2021 CLINICAL DATA:  Renal carcinoma. . Kidney CA, hx radiation therapy x 3 months ago; previous c-spine surgery 2015; mid-back pain x 2-3 months; known tumor in spine /// EXAM: MRI TOTAL SPINE WITHOUT AND WITH CONTRAST TECHNIQUE: Multisequence MR imaging of the spine from the cervical spine to the sacrum was  performed prior to and following IV contrast administration for evaluation of spinal metastatic disease. CONTRAST:  66m GADAVIST GADOBUTROL 1 MMOL/ML IV SOLN COMPARISON:  None. FINDINGS: Alignment:  Physiologic. Vertebrae: There is a hyperenhancing lesion within the posterior T9 vertebral body. No pathologic fracture. There is a much smaller lesion within the posterior T8 vertebral body. No other contrast-enhancing lesions of the spine. Cord:  Normal signal and morphology. Disc levels: No spinal canal stenosis. IMPRESSION: 1. Enhancing lesion within the posterior T9 vertebral body, consistent with metastatic disease. No pathologic fracture. 2. Nonspecific focus of contrast enhancement in the posterior T8 vertebral body, which may be a small metastatic lesion. 3. No spinal canal or neural foraminal stenosis. Electronically Signed   By: KUlyses JarredM.D.   On: 04/12/2021 03:40    Assessment and plan  # Ascites S/p paracentesis.  Fluid analysis showed fluid albumin <1, SAAG >1.1 indicating transudate, due to portal hypertension.  Cytology from previous paracentesis was negative.  Repeat cytology is pending.  GI recommendation appreciated.  Patient has been started on diuretics lasix and spironolactone.   # metastatic RCC Immunotherapy has been held due to chronic diarrhea.  # Chronic diarrhea, grade 1 Currently he has 3-4 episodes of loose BM daily, continue imodium PRN.  He is reluctant about starting another round of steroid due to side effects.  Monitor symptoms for now.  Encourage oral hydration.   # Nausea, no vomiting Zofran PRN. Improving after paracentesis.  # Anemia due to CKD, hb is stable.    Thank you for allowing me to participate in the care of this patient.     ZEarlie Server MD, PhD [facility]ology [facility] 04/15/2021

## 2021-04-15 NOTE — ED Notes (Signed)
Rn informed bed assigned 

## 2021-04-15 NOTE — Progress Notes (Signed)
PROGRESS NOTE    [name]  AOZ:[id number] DOB: [birth year] DOA: 04/14/2021 PCP: Albina Billet, MD  Outpatient Specialists: oncology, GI    Brief Narrative:   Henry Moore is a 60 y.o. 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  He also complains of ongoing diarrhea and of late has been feeling very weak and lightheaded.  He denies cough, fever or chills.  Denies abdominal pain or vomiting.   Assessment & Plan:   Principal Problem:   Cirrhosis of liver with ascites (HCC) Active Problems:   Essential hypertension, benign   Chronic kidney disease, stage 3b (HCC)   Type 2 diabetes mellitus (HCC)   Metastatic renal cell carcinoma (HCC)   Chronic diarrhea  # Cirrhosis # Ascites Hx cirrhosis, likely 2/2 nafld, was a previous drinker though no alcohol for 10 years. Last saw gi in 2020. 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 Has had small volume ascites on imaging for some time, but clinically significant ascites only in the past month. Had 4 L drained on 9/9, cytology from that is pending. Fluid has re accumulated. Low suspicion for sbp at this time. Ddx in terms of etiology includes patient's known metastatic cancer. - order in for IR paracentesis with fluid analysis - GI consulted, will see - start lasix 40 and spiro 100  # Metastatic RCC # Diarrhea. Immunotherapy currently on pause. Has T9 met with plan for kyphoplasty next week. Has chronic diarrhea associated w/ treatment. - cont  immodium - oncology notified of pt's hospitalization  # HTN - hold home losartan, hctz, and amlodipine in setting of low normal bp and initiation of diuretics  # CKD 3b # History radical nephrectomy Kidney function at baseline - monitor  # T2DM On glipizide and metformin at home - SSI here   DVT prophylaxis: SCDs for now, plan for lovenox beginning tomorrow Code Status: full Family Communication: none @ bedside  Level of care: Med-Surg Status is: Observation  The patient remains OBS appropriate and will d/c before 2 midnights. May d/c later today  Dispo: The patient is from: Home              Anticipated d/c is to: Home              Patient currently is not medically stable to d/c.   Difficult to place patient No        Consultants:  GI  Procedures: Plan for paracentesis  Antimicrobials:  none    Subjective: This morning belly feels tight. No nausea, no fevers.  Objective: Vitals:   04/14/21 1653 04/15/21 0030 04/15/21 0101 04/15/21 0700  BP: 125/76 (!) 153/71 140/78 121/60  Pulse: 85  82 80  Resp: _0 Temp: 98 F (36.7 C)  98.7 F (37.1 C)   TempSrc: Oral  Oral   SpO2: 98%  98% 97%  Height: _1  (1.854 m)       Intake/Output Summary (Last 24 hours) at 04/15/2021 0834 Last data filed at 04/15/2021 0117  Gross per 24 hour  Intake 500 ml  Output --  Net 500 ml   There were no vitals filed for this visit.  Examination:  General exam: Appears calm and comfortable  Respiratory system: Clear to auscultation. Respiratory effort normal. Cardiovascular system: S1 & S2 heard, RRR. No JVD, murmurs, rubs, gallops or clicks. No pedal edema. Gastrointestinal system: Abdomen is distended, soft and nontender. No organomegaly or masses felt. Normal bowel sounds heard. Central nervous system: Alert and oriented. No focal neurological deficits. Extremities: Symmetric 5 x 5 power. Skin: No rashes, lesions or ulcers Psychiatry: Judgement and insight  appear normal. Mood & affect appropriate.     Data Reviewed: I have personally reviewed following labs and imaging studies  CBC: Recent Labs  Lab 04/14/21 1656  WBC 4.5  HGB 10.5*  HCT 30.7*  MCV 91.9  PLT 557*   Basic Metabolic Panel: Recent Labs  Lab 04/14/21 1656  NA 132*  K 5.1  CL 105  CO2 21*  GLUCOSE 140*  BUN 32*  CREATININE 1.75*  CALCIUM 8.5*   GFR: Estimated Creatinine Clearance: 61.4 mL/min (A) (by C-G formula based on SCr of 1.75 mg/dL (H)). Liver Function Tests: Recent Labs  Lab 04/14/21 1656  AST 36  ALT 23  ALKPHOS 86  BILITOT 1.0  PROT 6.4*  ALBUMIN 2.9*   No results for input(s): LIPASE, AMYLASE in the last 168 hours. No results for input(s): AMMONIA in the last 168 hours. Coagulation Profile: Recent Labs  Lab 04/15/21 0032  INR 1.3*   Cardiac Enzymes: No results for input(s): CKTOTAL, CKMB, CKMBINDEX, TROPONINI in the last 168 hours. BNP (last 3 results) No results for input(s): PROBNP in the last 8760 hours. HbA1C: No results for input(s): HGBA1C in the last 72 hours. CBG: Recent Labs  Lab 04/15/21 0304  GLUCAP 107*   Lipid Profile: No results for input(s): CHOL, HDL, LDLCALC, TRIG, CHOLHDL, LDLDIRECT in the last 72 hours. Thyroid Function Tests: No results for input(s): TSH, T4TOTAL, FREET4, T3FREE, THYROIDAB in the last 72 hours. Anemia Panel: No results for input(s): VITAMINB12, FOLATE, FERRITIN, TIBC, IRON, RETICCTPCT in the last 72 hours. Urine analysis:    Component Value Date/Time   COLORURINE YELLOW (A) 04/15/2021 0032   APPEARANCEUR CLEAR (A) 04/15/2021 0032   LABSPEC 1.015 04/15/2021 0032   PHURINE 5.0 04/15/2021 0032   GLUCOSEU NEGATIVE 04/15/2021 0032   HGBUR SMALL (A) 04/15/2021 0032   BILIRUBINUR NEGATIVE 04/15/2021 0032   KETONESUR NEGATIVE 04/15/2021 0032   PROTEINUR NEGATIVE 04/15/2021 0032   NITRITE NEGATIVE 04/15/2021 0032   LEUKOCYTESUR NEGATIVE 04/15/2021 0032   Sepsis  Labs: _0 (procalcitonin:4,lacticidven:4)  ) Recent Results (from the past 240 hour(s))  Resp Panel by RT-PCR (Flu A&B, Covid) Nasopharyngeal Swab     Status: None   Collection Time: 04/15/21 12:32 AM   Specimen: Nasopharyngeal Swab; Nasopharyngeal(NP) swabs in vial transport medium  Result Value Ref Range Status   SARS Coronavirus 2 by RT PCR NEGATIVE NEGATIVE Final    Comment: (NOTE) SARS-CoV-2 target nucleic acids are NOT DETECTED.  The SARS-CoV-2 RNA is generally detectable in upper respiratory specimens during the acute phase of infection. The lowest concentration of SARS-CoV-2 viral copies this assay can detect is 138 copies/mL. A negative result does not preclude SARS-Cov-2 infection and should not be used as the sole basis for treatment or other patient management decisions. A negative result may occur with  improper specimen collection/handling, submission of specimen other than nasopharyngeal swab, presence of viral mutation(s) within  the areas targeted by this assay, and inadequate number of viral copies(<138 copies/mL). A negative result must be combined with clinical observations, patient history, and epidemiological information. The expected result is Negative.  Fact Sheet for Patients:  EntrepreneurPulse.com.au  Fact Sheet for Healthcare Providers:  IncredibleEmployment.be  This test is no t yet approved or cleared by the [place] FDA and  has been authorized for detection and/or diagnosis of SARS-CoV-2 by FDA under an Emergency Use Authorization (EUA). This EUA will remain  in effect (meaning this test can be used) for the duration of the COVID-19 declaration under Section 564(b)(1) of the Act, 21 U.S.C.section 360bbb-3(b)(1), unless the authorization is terminated  or revoked sooner.       Influenza A by PCR NEGATIVE NEGATIVE Final   Influenza B by PCR NEGATIVE NEGATIVE Final    Comment: (NOTE) The Xpert  Xpress SARS-CoV-2/FLU/RSV plus assay is intended as an aid in the diagnosis of influenza from Nasopharyngeal swab specimens and should not be used as a sole basis for treatment. Nasal washings and aspirates are unacceptable for Xpert Xpress SARS-CoV-2/FLU/RSV testing.  Fact Sheet for Patients: EntrepreneurPulse.com.au  Fact Sheet for Healthcare Providers: IncredibleEmployment.be  This test is not yet approved or cleared by the [place] FDA and has been authorized for detection and/or diagnosis of SARS-CoV-2 by FDA under an Emergency Use Authorization (EUA). This EUA will remain in effect (meaning this test can be used) for the duration of the COVID-19 declaration under Section 564(b)(1) of the Act, 21 U.S.C. section 360bbb-3(b)(1), unless the authorization is terminated or revoked.  Performed at [facility], [address], [place], [place] [address]          Radiology Studies: DG Chest Portable 1 View  Result Date: 04/14/2021 CLINICAL DATA:  Short of breath, cirrhosis, abdominal distension EXAM: PORTABLE CHEST 1 VIEW COMPARISON:  09/05/2018 FINDINGS: Single frontal view of the chest demonstrates right chest wall port via internal jugular approach tip overlying superior vena cava. The cardiac silhouette is unremarkable. No airspace disease, effusion, or pneumothorax. IMPRESSION: 1. No acute intrathoracic process. Electronically Signed   By: Randa Ngo M.D.   On: 04/14/2021 23:46        Scheduled Meds:  Chlorhexidine Gluconate Cloth  6 each Topical Daily   insulin aspart  0-20 Units Subcutaneous TID WC   insulin aspart  0-5 Units Subcutaneous QHS   sodium chloride flush  10-40 mL Intracatheter Q12H   Continuous Infusions:  sodium chloride 100 mL/hr at 04/15/21 0457     LOS: 0 days    Time spent: 19 min    Desma Maxim, MD [facility]   If 7PM-7AM, please contact  night-coverage [email or web address] Password [facility] 04/15/2021, 8:34 AM

## 2021-04-15 NOTE — Procedures (Signed)
PROCEDURE SUMMARY:  Successful US guided paracentesis from right lateral abdomen.  Yielded 5 liters of clear yellow fluid.  No immediate complications.  Patient tolerated well.  EBL = trace  Specimen was sent for labs.  Myana Schlup S Azan Maneri PA-C 04/15/2021 12:20 PM

## 2021-04-15 NOTE — Progress Notes (Signed)
PHARMACIST - PHYSICIAN COMMUNICATION  CONCERNING:  Enoxaparin (Lovenox) for DVT Prophylaxis    RECOMMENDATION: Patient was prescribed enoxaprin '40mg'$  q24 hours for VTE prophylaxis.   There were no vitals filed for this visit.  Body mass index is 33.75 kg/m.  Estimated Creatinine Clearance: 61.4 mL/min (A) (by C-G formula based on SCr of 1.75 mg/dL (H)).   Based on [place] patient is candidate for enoxaparin 0.'5mg'$ /kg TBW SQ every 24 hours based on BMI being >30.  DESCRIPTION: Pharmacy has adjusted enoxaparin dose per [facility] policy.  Patient is now receiving enoxaparin 57.5 mg every 24 hours    Berta Minor, PharmD Clinical Pharmacist  04/15/2021 8:51 AM

## 2021-04-15 NOTE — Consult Note (Addendum)
Vonda Antigua, MD [address], [place], [place], [place], [address] [id number] [place], [place], [place], [address] Phone: [phone]  Fax: [phone]  Consultation  Referring Provider:     Dr. Damita Dunnings Primary Care Physician:  Albina Billet, MD Reason for Consultation:     Ascites  Date of Admission:  04/14/2021 Date of Consultation:  04/15/2021         HPI:   Henry Moore is a 60 y.o. 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 Georgeann Oppenheim note from 2020, lost to follow-up in GI clinic presents with ascites.  Patient had reported abdominal distention and tight abdomen to oncologist and underwent paracentesis on 04/08/2021 (paracentesis record under imaging from 04/08/2020 to be reviewed) with 4 L fluid removed.  The only fluid studies I see from 04/08/2021 is a cytology that is still pending.  Due to recurrent abdominal distention patient underwent 5 L paracentesis today in the ER.  Body fluid cell count is not consistent with SBP, blood cultures pending  As per Dr. Georgeann Oppenheim clinic note, patient was incidentally found to have cirrhosis in the past, and labs in 2020 were negative for autoimmune and viral hepatitis.  He has had history of esophageal varices, portal hypertensive gastropathy, previous banding of the varices.  Most recent upper endoscopy in September 2020 showed grade 1 esophageal varices not requiring banding and portal hypertensive gastropathy.  He was previously on carvedilol for esophageal varices for prophylaxis from bleeding  Patient states he had abdominal distention and discomfort related to the distention, but not abdominal pain.  Also reports nausea but  no vomiting.  Symptoms ongoing for the last 2 to 3 weeks.  Denies fever or chills.  Patient's troponin on admission was normal, with EKG showing normal sinus rhythm  Past Medical History:  Diagnosis Date   Cough    SINUS DRAINAGE CAUSING COUGHING , REPORTS CLEAR MUCOUS    Diabetes mellitus without complication ([place])    Family history of breast cancer    Family history of colon cancer    Family history of stomach cancer    HTN (hypertension)    Hyperlipemia    Left renal mass    Platelets decreased ([place])    DENIES UNSUAL BLEEDING    PONV (postoperative nausea and vomiting)    Renal cell carcinoma ([place]) 08/04/2019   WBC decreased     Past Surgical History:  Procedure Laterality Date   ANKLE SURGERY Left    ANTERIOR CERVICAL DECOMP/DISCECTOMY FUSION N/A 04/16/2014   Procedure: ANTERIOR CERVICAL DECOMPRESSION/DISCECTOMY FUSION  (ACDF C5-C7)   (2 LEVELS)     ;  Surgeon: Melina Schools, MD;  Location: [facility];  Service: Orthopedics;  Laterality: N/A;   APPENDECTOMY     BACK SURGERY     ESOPHAGOGASTRODUODENOSCOPY (EGD) WITH PROPOFOL N/A 02/06/2019   Procedure: ESOPHAGOGASTRODUODENOSCOPY (EGD) WITH PROPOFOL;  Surgeon: [name], MD;  Location: [facility] ENDOSCOPY;  Service: Gastroenterology;  Laterality: N/A;   ESOPHAGOGASTRODUODENOSCOPY (EGD) WITH PROPOFOL N/A 03/04/2019   Procedure: ESOPHAGOGASTRODUODENOSCOPY (EGD) WITH PROPOFOL;  Surgeon: Lin Landsman, MD;  Location: [facility];  Service: Gastroenterology;  Laterality: N/A;   ESOPHAGOGASTRODUODENOSCOPY (EGD) WITH PROPOFOL N/A 04/22/2019   Procedure: ESOPHAGOGASTRODUODENOSCOPY (EGD) WITH PROPOFOL;  Surgeon:  [name], MD;  Location: [facility] ENDOSCOPY;  Service: Gastroenterology;  Laterality: N/A;   FRACTURE SURGERY     HYDROCELE EXCISION     KNEE ARTHROSCOPY Bilateral    LAPAROSCOPIC NEPHRECTOMY, HAND ASSISTED Left 10/16/2018   Procedure: LEFT HAND ASSISTED LAPAROSCOPIC NEPHRECTOMY;  Surgeon: Lucas Mallow, MD;  Location: [facility] ORS;   Service: Urology;  Laterality: Left;   NASAL SINUS SURGERY     X 2   PENILE PROSTHESIS IMPLANT  10 YEARS AGO   PORTA CATH INSERTION N/A 11/11/2019   Procedure: PORTA CATH INSERTION;  Surgeon: Katha Cabal, MD;  Location: [facility];  Service: Cardiovascular;  Laterality: N/A;   SHOULDER SURGERY Left     Prior to Admission medications   Medication Sig Start Date End Date Taking? Authorizing Provider  calcium carbonate (OS-CAL - DOSED IN MG OF ELEMENTAL CALCIUM) 1250 (500 Ca) MG tablet Take 1 tablet by mouth.   Yes [provider]  carvedilol (COREG) 25 MG tablet Take 1 tablet (25 mg total) by mouth 2 (two) times daily with a meal. 06/18/18  Yes Crissman, Jeannette How, MD  Cholecalciferol (VITAMIN D3) 125 MCG (5000 UT) CAPS Take 5,000 Units by mouth daily.   Yes [provider]  glipiZIDE (GLUCOTROL) 5 MG tablet Take 1 tablet (5 mg total) by mouth daily before breakfast. 06/18/18  Yes Crissman, Jeannette How, MD  loperamide (IMODIUM A-D) 2 MG tablet Take 2 mg by mouth 4 (four) times daily as needed for diarrhea or loose stools.   Yes [provider]  losartan (COZAAR) 100 MG tablet Take 1 tablet by mouth daily. 07/22/18  Yes Volney American, PA-C  lovastatin (MEVACOR) 40 MG tablet Take 1 tablet (40 mg total) by mouth at bedtime. 05/04/19  Yes Crissman, Jeannette How, MD  metFORMIN (GLUCOPHAGE-XR) 500 MG 24 hr tablet Take 500 mg by mouth 2 (two) times daily. 1 QAM, 1 QPM 04/02/19  Yes [provider]  prochlorperazine (COMPAZINE) 10 MG tablet Take 1 tablet (10 mg total) by mouth every 6 (six) hours as needed for nausea or vomiting. 08/07/19  Yes Burns, Wandra Feinstein, NP  vitamin B-12 (CYANOCOBALAMIN) 1000 MCG tablet Take 1 tablet (1,000 mcg total) by mouth daily. 01/01/20  Yes Earlie Server, MD  ACCU-CHEK AVIVA PLUS test strip CHECK BL00D SUGAR ONCE OR TWICE DAILY. 12/14/16   Johnson, Megan P, DO  amLODipine (NORVASC) 10 MG tablet Take 10 mg by mouth daily. Patient not  taking: No sig reported 06/18/18   [provider]  diclofenac Sodium (VOLTAREN) 1 % GEL Apply 2 g topically 4 (four) times daily. Patient not taking: No sig reported 09/02/20   Earlie Server, MD  hydrochlorothiazide (HYDRODIURIL) 25 MG tablet TAKE 1 TABLET DAILY. Patient not taking: No sig reported 07/22/18   Volney American, PA-C  insulin aspart (NOVOLOG) 100 UNIT/ML injection Inject into the skin. Inject under the skin 3 (three) times a day before meals Sliding scale, Daughters insulin, using while sugar is very out of wack from cancer treatments, temporary Patient not taking: No sig reported    [provider]  insulin lispro (INSULIN LISPRO) 100 UNIT/ML KwikPen Junior Inject into the skin 3 (three) times daily as needed (Above 250). Sliding scale Patient not taking: No sig reported    [provider]  ofloxacin (OCUFLOX) 0.3 % ophthalmic solution Dry eye Patient not taking: No sig reported 10/05/20   [provider]  predniSONE (DELTASONE) 10 MG tablet Take 1  tablet (10 mg total) by mouth daily with breakfast. Patient not taking: No sig reported 02/15/21   Earlie Server, MD  predniSONE (DELTASONE) 20 MG tablet Take 1 tablet (20 mg total) by mouth daily with breakfast. Patient not taking: No sig reported 02/11/21   Earlie Server, MD    Family History  Problem Relation Age of Onset   Diabetes Mother    Cancer Mother        neuroendocrine cancer   Cancer Father        neuroendocrine cancer of meninges   Cancer Maternal Grandmother        tomach dx 55   Alzheimer's disease Paternal Grandmother    Lung cancer Paternal Grandfather    Lung cancer Maternal Aunt    Colon cancer Maternal Uncle    Breast cancer Paternal Aunt        both dx 14s   Ovarian cancer Other        dx 49   Breast cancer Cousin      Social History   Tobacco Use   Smoking status: Former    Packs/day: 0.25    Years: 20.00    Pack years: 5.00    Types: Cigarettes    Quit date: 2015     Years since quitting: 7.7   Smokeless tobacco: Never  Vaping Use   Vaping Use: Never used  Substance Use Topics   Alcohol use: Not Currently    Alcohol/week: 0.0 standard drinks    Comment: no alchol in 1 year   Drug use: No    Allergies as of 04/14/2021 - Review Complete 04/14/2021  Allergen Reaction Noted   Codeine Itching 04/14/2014   Mucinex [guaifenesin er] Anxiety 05/21/2017    Review of Systems:    All systems reviewed and negative except where noted in HPI.   Physical Exam:  Constitutional: General:   Alert,  Well-developed, well-nourished, pleasant and cooperative in NAD BP (!) 141/95   Pulse 84   Temp 98.7 F (37.1 C) (Oral)   Resp 18   Ht '6\' 1"'$  (1.854 m)   SpO2 100%   BMI 33.75 kg/m   Eyes:  Sclera clear, no icterus.   Conjunctiva pink. PERRLA  Ears:  No scars, lesions or masses, Normal auditory acuity. Nose:  No deformity, discharge, or lesions. Mouth:  No deformity or lesions, oropharynx pink & moist.  Neck:  Supple; no masses or thyromegaly.  Respiratory: Normal respiratory effort, Normal percussion  Gastrointestinal: Soft, mildly distended, non-tender and without masses, hepatosplenomegaly or hernias noted.  No guarding or rebound tenderness.     Cardiac: No clubbing or edema.  No cyanosis. Normal posterior tibial pedal pulses noted.  Lymphatic:  No significant cervical or axillary adenopathy.  Psych:  Alert and cooperative. Normal mood and affect.  Musculoskeletal:  Normal gait. Head normocephalic, atraumatic. Symmetrical without gross deformities. 5/5 Upper and Lower extremity strength bilaterally.  Skin: Warm. Intact without significant lesions or rashes. No jaundice.  Neurologic:  Face symmetrical, tongue midline, Normal sensation to touch;  grossly normal neurologically.  Psych:  Alert and oriented x3, Alert and cooperative. Normal mood and affect.   LAB RESULTS: Recent Labs    04/14/21 1656 04/15/21 1118  WBC 4.5 4.8  HGB 10.5*  9.9*  HCT 30.7* 30.1*  PLT 110* 99*   BMET Recent Labs    04/14/21 1656 04/15/21 1118  NA 132* 135  K 5.1 4.5  CL 105 107  CO2 21* 19*  GLUCOSE 140* 124*  BUN 32* 34*  CREATININE 1.75* 1.48*  CALCIUM 8.5* 8.1*   LFT Recent Labs    04/14/21 1656  PROT 6.4*  ALBUMIN 2.9*  AST 36  ALT 23  ALKPHOS 86  BILITOT 1.0  BILIDIR 0.2  IBILI 0.8   PT/INR Recent Labs    04/15/21 0032  LABPROT 16.4*  INR 1.3*    STUDIES: US Paracentesis  Result Date: 04/15/2021 INDICATION: Recurrent ascites secondary to cirrhosis. Request for diagnostic and therapeutic paracentesis. EXAM: ULTRASOUND GUIDED PARACENTESIS MEDICATIONS: 1% lidocaine 10 mL COMPLICATIONS: None immediate. PROCEDURE: Informed written consent was obtained from the patient after a discussion of the risks, benefits and alternatives to treatment. A timeout was performed prior to the initiation of the procedure. Initial ultrasound scanning demonstrates a large amount of ascites within the right lateral abdomen. The right lateral abdomen was prepped and draped in the usual sterile fashion. 1% lidocaine was used for local anesthesia. Following this, a 6 Fr Safe-T-Centesis catheter was introduced. An ultrasound image was saved for documentation purposes. The paracentesis was performed. The catheter was removed and a dressing was applied. The patient tolerated the procedure well without immediate post procedural complication. FINDINGS: A total of approximately 5 L of clear yellow fluid was removed. Samples were sent to the laboratory as requested by the clinical team. IMPRESSION: Successful ultrasound-guided paracentesis yielding 5 liters of peritoneal fluid. Read by: Gareth Eagle, PA-C Electronically Signed   By: Michaelle Birks M.D.   On: 04/15/2021 12:19   DG Chest Portable 1 View  Result Date: 04/14/2021 CLINICAL DATA:  Short of breath, cirrhosis, abdominal distension EXAM: PORTABLE CHEST 1 VIEW COMPARISON:  09/05/2018 FINDINGS: Single  frontal view of the chest demonstrates right chest wall port via internal jugular approach tip overlying superior vena cava. The cardiac silhouette is unremarkable. No airspace disease, effusion, or pneumothorax. IMPRESSION: 1. No acute intrathoracic process. Electronically Signed   By: Randa Ngo M.D.   On: 04/14/2021 23:46    EKG normal sinus rhythm   Impression / Plan:   Henry Moore is a 60 y.o. y/o male with history of liver cirrhosis from NAFLD/alcoholic liver disease as per previous GI notes, history of renal cell cancer with mets to liver and bone, with recent holding of immunotherapy due to diarrhea admitted with recurrent ascites  Body fluid cell count does not suggest SBP Body fluid culture pending, follow-up to confirm absence of SBP  Body fluid albumin is less than 1.  I have added on serum albumin to labs done earlier today to calculate SAAG.  If albumin level is similar to yesterday's, which was 2.9, SAAG would be greater than 1.1 which would suggest ascites is from portal hypertension.  Cytology from September 9 and today are pending.  Follow-up to evaluate for malignancy as well  Patient's outpatient medications did not show any diuretics and these have been started as an inpatient which is appropriate.  Continue Lasix and spironolactone as tolerated and dose may be increased as tolerated as an inpatient or outpatient.  Monitor creatinine and electrolytes while on the medication and titrate to maintain euvolemic status  Patient states he has been following a low-salt diet and does not add additional salt to his foods.  Sodium restriction less than 2000 mg a day recommended and encouraged  Patient had hyponatremia yesterday and this is likely to cirrhosis and ascites.  This has improved today and is normal.  No signs of confusion No signs of active GI bleeding Hemoglobin at baseline  No indication for endoscopy at this time  Patient will need variceal surveillance as  an outpatient as previously planned and patient was lost to follow-up No indication for urgent EGD as an inpatient in the setting of above acute issues Would recommend continued medical optimization of ascites on this admission.  However, if clinical status changes, need for endoscopy can be reassessed as necessary.  Encourage good oral nutrition  Encourage alcohol abstinence  Rule out any etiology of decompensation of liver disease.  Chest x-ray does not show any pneumonia (images personally reviewed).  Patient otherwise afebrile.  We will also obtain ultrasound liver Doppler to rule out portal vein thrombosis  Repeat paracentesis as clinical status indicates for recurrent ascites  Thank you for involving me in the care of this patient.      LOS: 0 days   Virgel Manifold, MD  04/15/2021, 1:52 PM

## 2021-04-15 NOTE — H&P (Signed)
History and Physical    Henry Moore [id number] DOB: Dec 15, 1960 DOA: 04/14/2021  PCP: Albina Billet, MD   Patient coming from: home  I have personally briefly reviewed patient's old medical records in [facility]  Chief Complaint: abdominal distention, poor oral intake  HPI: Henry Moore is a 60 y.o. 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  He also complains of ongoing diarrhea and of late has been feeling very weak and lightheaded.  He denies cough, fever or chills.  Denies abdominal pain or vomiting.  ED course: On arrival, afebrile, pulse 85, BP 125/76 with O2 sat 98% on room air Labs with normal WBC and hemoglobin 10.5.  Mild hyponatremia of 132.  Creatinine 1.75 which is at baseline.  LFTs WNL.  Troponin 5 COVID and flu negative  EKG, personally viewed and interpreted: NSR at 85 with no acute ST-T wave changes  Imaging: Chest x-ray with no acute intrathoracic process  Patient given a fluid bolus.  Hospitalist consulted for admission for diagnostic and therapeutic paracentesis as well as IV hydration given poor oral intake secondary to nausea.  Review of Systems: As per HPI otherwise all other systems on review of systems negative.    Past Medical History:  Diagnosis Date   Cough    SINUS DRAINAGE CAUSING COUGHING , REPORTS CLEAR MUCOUS    Diabetes mellitus without complication ([place])    Family history of breast cancer    Family history of colon cancer     Family history of stomach cancer    HTN (hypertension)    Hyperlipemia    Left renal mass    Platelets decreased ([place])    DENIES UNSUAL BLEEDING    PONV (postoperative nausea and vomiting)    Renal cell carcinoma ([place]) 08/04/2019   WBC decreased     Past Surgical History:  Procedure Laterality Date   ANKLE SURGERY Left    ANTERIOR CERVICAL DECOMP/DISCECTOMY FUSION N/A 04/16/2014   Procedure: ANTERIOR CERVICAL DECOMPRESSION/DISCECTOMY FUSION  (ACDF C5-C7)   (2 LEVELS)     ;  Surgeon: Melina Schools, MD;  Location: [place];  Service: Orthopedics;  Laterality: N/A;   APPENDECTOMY     BACK SURGERY     ESOPHAGOGASTRODUODENOSCOPY (EGD) WITH PROPOFOL N/A 02/06/2019   Procedure: ESOPHAGOGASTRODUODENOSCOPY (EGD) WITH PROPOFOL;  Surgeon: [name], MD;  Location: [facility] ENDOSCOPY;  Service: Gastroenterology;  Laterality: N/A;   ESOPHAGOGASTRODUODENOSCOPY (EGD) WITH PROPOFOL N/A 03/04/2019   Procedure: ESOPHAGOGASTRODUODENOSCOPY (EGD) WITH PROPOFOL;  Surgeon: Lin Landsman, MD;  Location: [place];  Service: Gastroenterology;  Laterality: N/A;   ESOPHAGOGASTRODUODENOSCOPY (EGD) WITH PROPOFOL N/A 04/22/2019   Procedure: ESOPHAGOGASTRODUODENOSCOPY (EGD) WITH PROPOFOL;  Surgeon: [name], MD;  Location: [facility] ENDOSCOPY;  Service: Gastroenterology;  Laterality: N/A;   FRACTURE SURGERY     HYDROCELE EXCISION     KNEE ARTHROSCOPY Bilateral    LAPAROSCOPIC NEPHRECTOMY, HAND ASSISTED Left 10/16/2018   Procedure: LEFT HAND ASSISTED LAPAROSCOPIC NEPHRECTOMY;  Surgeon: Lucas Mallow, MD;  Location: [facility];  Service:  Urology;  Laterality: Left;   NASAL SINUS SURGERY     X 2   PENILE PROSTHESIS IMPLANT  10 YEARS AGO   PORTA CATH INSERTION N/A 11/11/2019   Procedure: PORTA CATH INSERTION;  Surgeon: Katha Cabal, MD;  Location: [facility];  Service: Cardiovascular;  Laterality: N/A;   SHOULDER SURGERY Left      reports that he quit smoking about 7 years ago. He has a 5.00 pack-year  smoking history. He has never used smokeless tobacco. He reports that he does not currently use alcohol. He reports that he does not use drugs.  Allergies  Allergen Reactions   Codeine Itching   Mucinex [Guaifenesin Er] Anxiety    anxiety    Family History  Problem Relation Age of Onset   Diabetes Mother    Cancer Mother        neuroendocrine cancer   Cancer Father        neuroendocrine cancer of meninges   Cancer Maternal Grandmother        tomach dx 64   Alzheimer's disease Paternal Grandmother    Lung cancer Paternal Grandfather    Lung cancer Maternal Aunt    Colon cancer Maternal Uncle    Breast cancer Paternal Aunt        both dx 44s   Ovarian cancer Other        dx 45   Breast cancer Cousin       Prior to Admission medications   Medication Sig Start Date End Date Taking? Authorizing Provider  calcium carbonate (OS-CAL - DOSED IN MG OF ELEMENTAL CALCIUM) 1250 (500 Ca) MG tablet Take 1 tablet by mouth.   Yes [provider]  carvedilol (COREG) 25 MG tablet Take 1 tablet (25 mg total) by mouth 2 (two) times daily with a meal. 06/18/18  Yes Crissman, Jeannette How, MD  Cholecalciferol (VITAMIN D3) 125 MCG (5000 UT) CAPS Take 5,000 Units by mouth daily.   Yes [provider]  glipiZIDE (GLUCOTROL) 5 MG tablet Take 1 tablet (5 mg total) by mouth daily before breakfast. 06/18/18  Yes Crissman, Jeannette How, MD  loperamide (IMODIUM A-D) 2 MG tablet Take 2 mg by mouth 4 (four) times daily as needed for diarrhea or loose stools.   Yes [provider]  losartan (COZAAR) 100 MG tablet Take 1 tablet by mouth daily. 07/22/18  Yes Volney American, PA-C  lovastatin (MEVACOR) 40 MG tablet Take 1 tablet (40 mg total) by mouth at bedtime. 05/04/19  Yes Crissman, Jeannette How, MD  metFORMIN (GLUCOPHAGE-XR) 500 MG 24 hr tablet Take 500 mg by mouth 2 (two) times daily. 1 QAM, 1 QPM 04/02/19  Yes [provider]  prochlorperazine (COMPAZINE) 10 MG tablet Take 1 tablet (10  mg total) by mouth every 6 (six) hours as needed for nausea or vomiting. 08/07/19  Yes Burns, Wandra Feinstein, NP  vitamin B-12 (CYANOCOBALAMIN) 1000 MCG tablet Take 1 tablet (1,000 mcg total) by mouth daily. 01/01/20  Yes Earlie Server, MD  ACCU-CHEK AVIVA PLUS test strip CHECK BL00D SUGAR ONCE OR TWICE DAILY. 12/14/16   Johnson, Megan P, DO  amLODipine (NORVASC) 10 MG tablet Take 10 mg by mouth daily. Patient not taking: No sig reported 06/18/18   [provider]  diclofenac Sodium (VOLTAREN) 1 % GEL Apply 2 g topically 4 (four) times daily. Patient not taking: No sig reported 09/02/20   Earlie Server, MD  hydrochlorothiazide (HYDRODIURIL) 25 MG tablet TAKE 1 TABLET DAILY.  Patient not taking: No sig reported 07/22/18   Volney American, PA-C  insulin aspart (NOVOLOG) 100 UNIT/ML injection Inject into the skin. Inject under the skin 3 (three) times a day before meals Sliding scale, Daughters insulin, using while sugar is very out of wack from cancer treatments, temporary Patient not taking: No sig reported    [provider]  insulin detemir (LEVEMIR) 100 UNIT/ML injection Inject into the skin. Inject 15 units under the skin every night at night, temporary, daughter's insulin Patient not taking: No sig reported    [provider]  insulin lispro (INSULIN LISPRO) 100 UNIT/ML KwikPen Junior Inject into the skin 3 (three) times daily as needed (Above 250). Sliding scale Patient not taking: No sig reported    [provider]  ofloxacin (OCUFLOX) 0.3 % ophthalmic solution Dry eye Patient not taking: No sig reported 10/05/20   [provider]  predniSONE (DELTASONE) 10 MG tablet Take 1 tablet (10 mg total) by mouth daily with breakfast. Patient not taking: No sig reported 02/15/21   Earlie Server, MD  predniSONE (DELTASONE) 20 MG tablet Take 1 tablet (20 mg total) by mouth daily with breakfast. Patient not taking: No sig reported 02/11/21   Earlie Server, MD    Physical  Exam: Vitals:   04/14/21 1653 04/15/21 0030 04/15/21 0101  BP: 125/76 (!) 153/71 140/78  Pulse: 85  82  Resp: 18  18  Temp: 98 F (36.7 C)  98.7 F (37.1 C)  TempSrc: Oral  Oral  SpO2: 98%  98%  Height: '6\' 1"'$  (1.854 m)       Vitals:   04/14/21 1653 04/15/21 0030 04/15/21 0101  BP: 125/76 (!) 153/71 140/78  Pulse: 85  82  Resp: 18  18  Temp: 98 F (36.7 C)  98.7 F (37.1 C)  TempSrc: Oral  Oral  SpO2: 98%  98%  Height: '6\' 1"'$  (1.854 m)        Constitutional: Chronically ill-appearing male, oriented x 3 . Not in any apparent distress HEENT:      Head: Normocephalic and atraumatic.         Eyes: PERLA, EOMI, Conjunctivae are normal. Sclera is icteric      Mouth/Throat: Mucous membranes are moist.       Neck: Supple with no signs of meningismus. Cardiovascular: Regular rate and rhythm. No murmurs, gallops, or rubs. 2+ symmetrical distal pulses are present . No JVD. No LE edema Respiratory: Respiratory effort normal .Lungs sounds clear bilaterally. No wheezes, crackles, or rhonchi.  Gastrointestinal: Distended, nontender Genitourinary: No CVA tenderness. Musculoskeletal: Nontender with normal range of motion in all extremities. No cyanosis, or erythema of extremities. Neurologic:  Face is symmetric. Moving all extremities. No gross focal neurologic deficits . Skin: Skin is warm, dry.  No rash or ulcers Psychiatric: Mood and affect are normal    Labs on Admission: I have personally reviewed following labs and imaging studies  CBC: Recent Labs  Lab 04/14/21 1656  WBC 4.5  HGB 10.5*  HCT 30.7*  MCV 91.9  PLT [id number]*   Basic Metabolic Panel: Recent Labs  Lab 04/14/21 1656  NA 132*  K 5.1  CL 105  CO2 21*  GLUCOSE 140*  BUN 32*  CREATININE 1.75*  CALCIUM 8.5*   GFR: Estimated Creatinine Clearance: 61.4 mL/min (A) (by C-G formula based on SCr of 1.75 mg/dL (H)). Liver Function Tests: Recent Labs  Lab 04/14/21 1656  AST 36  ALT 23  ALKPHOS 86  BILITOT  1.0  PROT 6.4*  ALBUMIN 2.9*   No results for input(s): LIPASE, AMYLASE in the last 168 hours. No results for input(s): AMMONIA in the last 168 hours. Coagulation Profile: Recent Labs  Lab 04/15/21 0032  INR 1.3*   Cardiac Enzymes: No results for input(s): CKTOTAL, CKMB, CKMBINDEX, TROPONINI in the last 168 hours. BNP (last 3 results) No results for input(s): PROBNP in the last 8760 hours. HbA1C: No results for input(s): HGBA1C in the last 72 hours. CBG: No results for input(s): GLUCAP in the last 168 hours. Lipid Profile: No results for input(s): CHOL, HDL, LDLCALC, TRIG, CHOLHDL, LDLDIRECT in the last 72 hours. Thyroid Function Tests: No results for input(s): TSH, T4TOTAL, FREET4, T3FREE, THYROIDAB in the last 72 hours. Anemia Panel: No results for input(s): VITAMINB12, FOLATE, FERRITIN, TIBC, IRON, RETICCTPCT in the last 72 hours. Urine analysis: No results found for: COLORURINE, APPEARANCEUR, LABSPEC, [facility], GLUCOSEU, HGBUR, BILIRUBINUR, KETONESUR, PROTEINUR, UROBILINOGEN, NITRITE, LEUKOCYTESUR  Radiological Exams on Admission: DG Chest Portable 1 View  Result Date: 04/14/2021 CLINICAL DATA:  Short of breath, cirrhosis, abdominal distension EXAM: PORTABLE CHEST 1 VIEW COMPARISON:  09/05/2018 FINDINGS: Single frontal view of the chest demonstrates right chest wall port via internal jugular approach tip overlying superior vena cava. The cardiac silhouette is unremarkable. No airspace disease, effusion, or pneumothorax. IMPRESSION: 1. No acute intrathoracic process. Electronically Signed   By: Randa Ngo M.D.   On: 04/14/2021 23:46     Assessment/Plan 60 year old male with history of renal cell carcinoma metastatic to lung and bones status post radiation and on immunotherapy, scheduled for kyphoplasty at [facility] in 1 week, cirrhosis with ascites s/p paracentesis on 9/9 with removal of 4 L, CKD stage IIIb, HTN, DM and chronic diarrhea related to immunotherapy/radiation  treatment, presenting with increasing abdominal distention since his paracentesis on 9/9, associated with abdominal discomfort, nausea and inability to eat or drink x1 day as well as shortness of breath.      Cirrhosis of liver with ascites (HCC) -Increased abdominal distention associated with nausea and decreased oral intake in the setting of recent paracentesis on 9/9 with removal of 4 L - IR consult for paracentesis, diagnostic and therapeutic - Gentle IV hydration given poor oral intake - IV antiemetics,  - GI consult.  Patient sees Dr. [name] - Consider furosemide and spironolactone - Abdomen nontender, low suspicion for SBP at this time    Essential hypertension, benign - Hold hydrochlorothiazide and amlodipine    Chronic kidney disease, stage 3b (HCC) - Creatinine 1.75, at baseline    Type 2 diabetes mellitus ([place]) - Sliding scale insulin coverage    Metastatic renal cell carcinoma ([place]) - Patient has mets to lungs and spine - Patient on immunotherapy, currently on hold due to diarrhea and upcoming surgery at [facility] for kyphoplasty - Consider inpatient oncology consult    Chronic diarrhea -Related to prior radiation therapy - Loperamide as needed  DVT prophylaxis: SCD given paracentesis in the am Code Status: full code  Family Communication:  none  Disposition Plan: Back to previous home environment Consults called: GI. IR  Status:observation    Athena Masse MD [facility]     04/15/2021, 1:41 AM

## 2021-04-15 NOTE — ED Notes (Signed)
Pt's port is being accessed by IV Team prior to transfer.

## 2021-04-16 ENCOUNTER — Observation Stay: Payer: BC Managed Care – PPO

## 2021-04-16 DIAGNOSIS — K7460 Unspecified cirrhosis of liver: Secondary | ICD-10-CM | POA: Diagnosis not present

## 2021-04-16 DIAGNOSIS — R188 Other ascites: Secondary | ICD-10-CM

## 2021-04-16 DIAGNOSIS — E871 Hypo-osmolality and hyponatremia: Secondary | ICD-10-CM | POA: Diagnosis not present

## 2021-04-16 LAB — BASIC METABOLIC PANEL
Anion gap: 6 (ref 5–15)
BUN: 36 mg/dL — ABNORMAL HIGH (ref 6–20)
CO2: 20 mmol/L — ABNORMAL LOW (ref 22–32)
Calcium: 7.7 mg/dL — ABNORMAL LOW (ref 8.9–10.3)
Chloride: 108 mmol/L (ref 98–111)
Creatinine, Ser: 1.92 mg/dL — ABNORMAL HIGH (ref 0.61–1.24)
GFR, Estimated: 40 mL/min — ABNORMAL LOW (ref >60)
Glucose, Bld: 95 mg/dL (ref 70–99)
Potassium: 4.2 mmol/L (ref 3.5–5.1)
Sodium: 134 mmol/L — ABNORMAL LOW (ref 135–145)

## 2021-04-16 LAB — CBC
HCT: 26.8 % — ABNORMAL LOW (ref 39.0–52.0)
Hemoglobin: 9.2 g/dL — ABNORMAL LOW (ref 13.0–17.0)
MCH: 31.4 pg (ref 26.0–34.0)
MCHC: 34.3 g/dL (ref 30.0–36.0)
MCV: 91.5 fL (ref 80.0–100.0)
Platelets: 94 10*3/uL — ABNORMAL LOW (ref 150–400)
RBC: 2.93 MIL/uL — ABNORMAL LOW (ref 4.22–5.81)
RDW: 14.3 % (ref 11.5–15.5)
WBC: 3.7 10*3/uL — ABNORMAL LOW (ref 4.0–10.5)
nRBC: 0 % (ref 0.0–0.2)

## 2021-04-16 LAB — GLUCOSE, CAPILLARY
Glucose-Capillary: 97 mg/dL (ref 70–99)
Glucose-Capillary: 180 mg/dL — ABNORMAL HIGH (ref 70–99)

## 2021-04-16 MED ORDER — HEPARIN SOD (PORK) LOCK FLUSH 100 UNIT/ML IV SOLN
500.0000 [IU] | Freq: Once | INTRAVENOUS | Status: AC
  Administered 2021-04-16: 500 [IU] via INTRAVENOUS
  Filled 2021-04-16: qty 5

## 2021-04-16 MED ORDER — FUROSEMIDE 40 MG PO TABS: 40.0000 mg | ORAL_TABLET | Freq: Every day | ORAL | 1 refills | Status: DC

## 2021-04-16 MED ORDER — PRAVASTATIN SODIUM 20 MG PO TABS: 20.0000 mg | ORAL_TABLET | Freq: Every evening | ORAL | 11 refills | Status: AC

## 2021-04-16 MED ORDER — SPIRONOLACTONE 100 MG PO TABS: 100.0000 mg | ORAL_TABLET | Freq: Every day | ORAL | 1 refills | Status: DC

## 2021-04-16 NOTE — Progress Notes (Signed)
Henry Antigua, MD [address] [place], [place], [place], [address] [address], [place], [place], [place], [address] Phone: [phone]  Fax: [phone]   Subjective:  Patient reports improvement in abdominal discomfort.  Tolerating oral diet without any nausea or vomiting.  Objective: Exam: Vital signs in last 24 hours: Vitals:   04/15/21 2230 04/15/21 2355 04/16/21 0613 04/16/21 0800  BP:  (!) 145/75 136/70 (!) 151/80  Pulse:  83 78 81  Resp:  '18 18 18  '$ Temp:  98.4 F (36.9 C) 98.1 F (36.7 C) 98.1 F (36.7 C)  TempSrc:  Oral  Oral  SpO2: 100% 100% 100% 100%  Height:       Weight change:   Intake/Output Summary (Last 24 hours) at 04/16/2021 1101 Last data filed at 04/15/2021 2357 Gross per 24 hour  Intake 10 ml  Output 600 ml  Net -590 ml    Constitutional: General:   Alert,  Well-developed, well-nourished, pleasant and cooperative in NAD BP (!) 151/80 (BP Location: Right Arm)   Pulse 81   Temp 98.1 F (36.7 C) (Oral)   Resp 18   Ht '6\' 1"'$  (1.854 m)   SpO2 100%   BMI 33.75 kg/m   Eyes:  Sclera clear, no icterus.   Conjunctiva pink.   Ears:  No scars, lesions or masses, Normal auditory acuity. Nose:  No deformity, discharge, or lesions. Mouth:  No deformity or lesions, oropharynx pink & moist.  Neck:  Supple; no masses, trachea midline  Respiratory: Normal respiratory effort  Gastrointestinal:  Soft, non-tender and non-distended without masses, hepatosplenomegaly or hernias noted.  No guarding or rebound tenderness.     Cardiac: No clubbing or edema.  No cyanosis. Normal posterior tibial pedal pulses noted.  Lymphatic:  No significant cervical adenopathy.  Psych:  Alert and cooperative. Normal mood and affect.  Musculoskeletal:  Head normocephalic, atraumatic. 5/5 Lower extremity strength bilaterally.  Skin: Warm. Intact without significant lesions or rashes. No jaundice.  Neurologic:  Face symmetrical, tongue midline, Normal  sensation to touch  Psych:  Alert and oriented x3, Alert and cooperative. Normal mood and affect.   Lab Results: Lab Results  Component Value Date   WBC 3.7 (L) 04/16/2021   HGB 9.2 (L) 04/16/2021   HCT 26.8 (L) 04/16/2021   MCV 91.5 04/16/2021   PLT 94 (L) 04/16/2021   Micro Results: Recent Results (from the past 240 hour(s))  Resp Panel by RT-PCR (Flu A&B, Covid) Nasopharyngeal Swab     Status: None   Collection Time: 04/15/21 12:32 AM   Specimen: Nasopharyngeal Swab; Nasopharyngeal(NP) swabs in vial transport medium  Result Value Ref Range Status   SARS Coronavirus 2 by RT PCR NEGATIVE NEGATIVE Final    Comment: (NOTE) SARS-CoV-2 target nucleic acids are NOT DETECTED.  The SARS-CoV-2 RNA is generally detectable in upper respiratory specimens during the acute phase of infection. The lowest concentration of SARS-CoV-2 viral copies this assay can detect is 138 copies/mL. A negative result does not preclude SARS-Cov-2 infection and should not be used as the sole basis for treatment or other patient management decisions. A negative result may occur with  improper specimen collection/handling, submission of specimen other than nasopharyngeal swab, presence of viral mutation(s) within the areas targeted by this assay, and inadequate number of viral copies(<138 copies/mL). A negative result must be combined with clinical observations, patient history, and epidemiological information. The expected result is Negative.  Fact Sheet for Patients:  EntrepreneurPulse.com.au  Fact Sheet for Healthcare Providers:  IncredibleEmployment.be  This test is no t yet approved or cleared by the [place] and  has been authorized for detection and/or diagnosis of SARS-CoV-2 by FDA under an Emergency Use Authorization (EUA). This EUA will remain  in effect (meaning this test can be used) for the duration of the COVID-19 declaration under Section  564(b)(1) of the Act, 21 U.S.C.section 360bbb-3(b)(1), unless the authorization is terminated  or revoked sooner.       Influenza A by PCR NEGATIVE NEGATIVE Final   Influenza B by PCR NEGATIVE NEGATIVE Final    Comment: (NOTE) The Xpert Xpress SARS-CoV-2/FLU/RSV plus assay is intended as an aid in the diagnosis of influenza from Nasopharyngeal swab specimens and should not be used as a sole basis for treatment. Nasal washings and aspirates are unacceptable for Xpert Xpress SARS-CoV-2/FLU/RSV testing.  Fact Sheet for Patients: EntrepreneurPulse.com.au  Fact Sheet for Healthcare Providers: IncredibleEmployment.be  This test is not yet approved or cleared by the [place] FDA and has been authorized for detection and/or diagnosis of SARS-CoV-2 by FDA under an Emergency Use Authorization (EUA). This EUA will remain in effect (meaning this test can be used) for the duration of the COVID-19 declaration under Section 564(b)(1) of the Act, 21 U.S.C. section 360bbb-3(b)(1), unless the authorization is terminated or revoked.  Performed at [facility], [address] [place], [place] [address]   Aerobic/Anaerobic Culture w Gram Stain (surgical/deep wound)     Status: None (Preliminary result)   Collection Time: 04/15/21 10:30 AM   Specimen: PATH Cytology Peritoneal fluid  Result Value Ref Range Status   Specimen Description   Final    PERITONEAL Performed at [facility], [address] [place], [place] [address]    Special Requests   Final    NONE Performed at [facility], [address] [place], [place] [address]    Gram Stain   Final    RARE WBC PRESENT, PREDOMINANTLY MONONUCLEAR NO ORGANISMS SEEN Performed at [facility], [place] [address], [place], [place] [address]    Culture PENDING  Incomplete   Report Status PENDING  Incomplete   Studies/Results: US Paracentesis  Result Date:  04/15/2021 INDICATION: Recurrent ascites secondary to cirrhosis. Request for diagnostic and therapeutic paracentesis. EXAM: ULTRASOUND GUIDED PARACENTESIS MEDICATIONS: 1% lidocaine 10 mL COMPLICATIONS: None immediate. PROCEDURE: Informed written consent was obtained from the patient after a discussion of the risks, benefits and alternatives to treatment. A timeout was performed prior to the initiation of the procedure. Initial ultrasound scanning demonstrates a large amount of ascites within the right lateral abdomen. The right lateral abdomen was prepped and draped in the usual sterile fashion. 1% lidocaine was used for local anesthesia. Following this, a 6 Fr Safe-T-Centesis catheter was introduced. An ultrasound image was saved for documentation purposes. The paracentesis was performed. The catheter was removed and a dressing was applied. The patient tolerated the procedure well without immediate post procedural complication. FINDINGS: A total of approximately 5 L of clear yellow fluid was removed. Samples were sent to the laboratory as requested by the clinical team. IMPRESSION: Successful ultrasound-guided paracentesis yielding 5 liters of peritoneal fluid. Read by: Gareth Eagle, PA-C Electronically Signed   By: Michaelle Birks M.D.   On: 04/15/2021 12:19   DG Chest Portable 1 View  Result Date: 04/14/2021 CLINICAL DATA:  Short of breath, cirrhosis, abdominal distension EXAM: PORTABLE CHEST 1 VIEW COMPARISON:  09/05/2018 FINDINGS: Single frontal view of the chest demonstrates right chest wall  port via internal jugular approach tip overlying superior vena cava. The cardiac silhouette is unremarkable. No airspace disease, effusion, or pneumothorax. IMPRESSION: 1. No acute intrathoracic process. Electronically Signed   By: Randa Ngo M.D.   On: 04/14/2021 23:46   Medications:  Scheduled Meds:  Chlorhexidine Gluconate Cloth  6 each Topical Daily   enoxaparin (LOVENOX) injection  0.5 mg/kg Subcutaneous QHS    furosemide  40 mg Oral Daily   insulin aspart  0-20 Units Subcutaneous TID WC   insulin aspart  0-5 Units Subcutaneous QHS   sodium chloride flush  10-40 mL Intracatheter Q12H   spironolactone  100 mg Oral Daily   Continuous Infusions:  sodium chloride 100 mL/hr at 04/16/21 0957   PRN Meds:.acetaminophen **OR** acetaminophen, loperamide, ondansetron **OR** ondansetron (ZOFRAN) IV, sodium chloride flush   Assessment: Principal Problem:   Cirrhosis of liver with ascites (HCC) Active Problems:   Essential hypertension, benign   Chronic kidney disease, stage 3b (HCC)   Type 2 diabetes mellitus ([place])   Metastatic renal cell carcinoma (HCC)   Chronic diarrhea   Ascites    Plan: Ultrasound liver Doppler done today, did not show any portal or hepatic venous obstruction.  Cirrhosis of the liver is noted.  Trace intra-abdominal ascites reported  Continue current diuretics, Lasix 40 and spironolactone 100 mg a day  Low-salt diet less than 2000 mg a day  Close monitoring of creatinine given elevated creatinine chronically.  Follow-up with PCP and or nephrology in this regard  Sodium level has slightly decreased today, but this is likely his baseline given his chronic liver disease  Patient agreeable to following up with Dr. [name] on discharge.    LOS: 1 day   Henry Antigua, MD 04/16/2021, 11:01 AM

## 2021-04-16 NOTE — Discharge Summary (Signed)
Henry Moore YCX:[id number] DOB: 1961/04/24 DOA: 04/14/2021  PCP: Albina Billet, MD  Admit date: 04/14/2021 Discharge date: 04/16/2021  Time spent: 40 minutes  Recommendations for Outpatient Follow-up:  F/u pcp, GI, and oncology Will need bmp in 1-2 weeks (diuretics started)     Discharge Diagnoses:  Principal Problem:   Cirrhosis of liver with ascites ([place]) Active Problems:   Essential hypertension, benign   Chronic kidney disease, stage 3b (HCC)   Type 2 diabetes mellitus ([place])   Metastatic renal cell carcinoma (HCC)   Chronic diarrhea   Ascites   Discharge Condition: stable  Diet recommendation: low sodium   History of present illness:  Henry Moore is a 60 y.o. 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  He also complains of ongoing diarrhea and of late has been feeling very weak and lightheaded.  He denies cough, fever or chills.  Denies abdominal pain or vomiting.  Hospital Course:  # Cirrhosis # Ascites Hx cirrhosis, likely 2/2 nafld, was a previous drinker though no alcohol for 10 years. Last saw gi in 2020. 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 Has had small volume ascites on imaging for some time, but clinically significant ascites only in the past month. Had 4 L drained on 9/9, cytology from that is negative. Fluid has re accumulated, had repeat paracentesis with 5 L drained on 9/16. No sbp. Doppler u/w w/o concerning findings - lasix spiro ordered, will need bmp 1 wk -  aware needs to re-establish w/ GI - IR advised f/u in their clinic, can consider this at GI f/u   # Metastatic RCC # Diarrhea. Immunotherapy currently on pause. Has T9 met with plan for kyphoplasty next week. Has chronic diarrhea associated w/ treatment. - cont immodium - oncology and neurosurg f/u as scheduled   # HTN - hold home losartan, hctz, and amlodipine in setting of low normal bp and initiation of diuretics   # CKD 3b # History radical nephrectomy Kidney function at baseline - monitor   # T2DM On glipizide and metformin at home. A1c 5.2 - stop these meds for now given normal a1c, normal glucose here off those meds, and new decompensated cirrhosis - switch to pravastatin for liver safety  Procedures: paracentesis   Consultations: GI, IR  Discharge Exam: Vitals:   04/16/21 0613 04/16/21 0800  BP: 136/70 (!) 151/80  Pulse: 78 81  Resp: 18 18  Temp: 98.1 F (36.7 C) 98.1 F (36.7 C)  SpO2: 100% 100%    General exam: Appears calm and comfortable  Respiratory system: Clear to auscultation. Respiratory effort normal. Cardiovascular system: S1 & S2 heard, RRR. No JVD, murmurs, rubs, gallops or clicks. No pedal edema. Gastrointestinal system: Abdomen is distended, soft and nontender. No organomegaly or masses felt. Normal bowel sounds heard. Central nervous system: Alert and oriented. No focal neurological deficits. Extremities: Symmetric 5 x 5 power. Skin: No rashes, lesions or ulcers Psychiatry: Judgement and insight appear normal. Mood & affect appropriate.   Discharge Instructions   Discharge Instructions  Diet - low sodium heart healthy   Complete by: As directed    Increase activity slowly   Complete by: As directed       Allergies as of 04/16/2021       Reactions   Codeine Itching   Mucinex [guaifenesin Er] Anxiety   anxiety        Medication List     STOP taking these medications    amLODipine 10 MG tablet Commonly known as:  NORVASC   diclofenac Sodium 1 % Gel Commonly known as: Voltaren   glipiZIDE 5 MG tablet Commonly known as: GLUCOTROL   HumaLOG Junior KwikPen 100 UNIT/ML KwikPen Junior Generic drug: insulin lispro   hydrochlorothiazide 25 MG tablet Commonly known as: HYDRODIURIL   insulin aspart 100 UNIT/ML injection Commonly known as: novoLOG   losartan 100 MG tablet Commonly known as: COZAAR   lovastatin 40 MG tablet Commonly known as: MEVACOR   metFORMIN 500 MG 24 hr tablet Commonly known as: GLUCOPHAGE-XR   ofloxacin 0.3 % ophthalmic solution Commonly known as: OCUFLOX   predniSONE 10 MG tablet Commonly known as: DELTASONE   predniSONE 20 MG tablet Commonly known as: DELTASONE       TAKE these medications    Accu-Chek Aviva Plus test strip Generic drug: glucose blood CHECK BL00D SUGAR ONCE OR TWICE DAILY.   calcium carbonate 1250 (500 Ca) MG tablet Commonly known as: OS-CAL - dosed in mg of elemental calcium Take 1 tablet by mouth.   carvedilol 25 MG tablet Commonly known as: COREG Take 1 tablet (25 mg total) by mouth 2 (two) times daily with a meal.   furosemide 40 MG tablet Commonly known as: LASIX Take 1 tablet (40 mg total) by mouth daily. Start taking on: April 17, 2021   loperamide 2 MG tablet Commonly known as: IMODIUM A-D Take 2 mg by mouth 4 (four) times daily as needed for diarrhea or loose stools.   pravastatin 20 MG tablet Commonly known as: Pravachol Take 1 tablet (20 mg total) by mouth every evening.   prochlorperazine 10 MG tablet Commonly known as: COMPAZINE Take 1 tablet (10 mg total) by mouth every 6 (six) hours as needed for nausea or vomiting.   spironolactone 100 MG tablet Commonly known as: ALDACTONE Take 1 tablet (100 mg total) by mouth daily. Start taking on: April 17, 2021   vitamin B-12 1000 MCG tablet Commonly known as: CYANOCOBALAMIN Take 1 tablet (1,000 mcg total) by mouth daily.   Vitamin D3 125 MCG (5000 UT)  Caps Take 5,000 Units by mouth daily.       Allergies  Allergen Reactions   Codeine Itching   Mucinex [Guaifenesin Er] Anxiety    anxiety    Follow-up Information     Albina Billet, MD Follow up.   Specialty: Internal Medicine Contact information: [address] [place] [address] [phone]         [name], MD. Schedule an appointment as soon as possible for a visit.   Specialty: Gastroenterology Contact information: [place] [address] [phone]                  The results of significant diagnostics from this hospitalization (including imaging, microbiology, ancillary and laboratory) are listed below for reference.    Significant Diagnostic Studies: US Paracentesis  Result Date: 04/15/2021 INDICATION: Recurrent ascites secondary to cirrhosis. Request for diagnostic and therapeutic paracentesis. EXAM: ULTRASOUND GUIDED PARACENTESIS MEDICATIONS: 1%  lidocaine 10 mL COMPLICATIONS: None immediate. PROCEDURE: Informed written consent was obtained from the patient after a discussion of the risks, benefits and alternatives to treatment. A timeout was performed prior to the initiation of the procedure. Initial ultrasound scanning demonstrates a large amount of ascites within the right lateral abdomen. The right lateral abdomen was prepped and draped in the usual sterile fashion. 1% lidocaine was used for local anesthesia. Following this, a 6 Fr Safe-T-Centesis catheter was introduced. An ultrasound image was saved for documentation purposes. The paracentesis was performed. The catheter was removed and a dressing was applied. The patient tolerated the procedure well without immediate post procedural complication. FINDINGS: A total of approximately 5 L of clear yellow fluid was removed. Samples were sent to the laboratory as requested by the clinical team. IMPRESSION: Successful ultrasound-guided paracentesis yielding 5 liters  of peritoneal fluid. Read by: Gareth Eagle, PA-C Electronically Signed   By: Michaelle Birks M.D.   On: 04/15/2021 12:19   US Paracentesis  Result Date: 04/08/2021 INDICATION: Symptomatic ascites. EXAM: ULTRASOUND GUIDED PARACENTESIS MEDICATIONS: None. COMPLICATIONS: None immediate. PROCEDURE: Informed written consent was obtained from the patient after a discussion of the risks, benefits and alternatives to treatment. A timeout was performed prior to the initiation of the procedure. Initial ultrasound scanning demonstrates a large amount of ascites within the right lower abdominal quadrant. The right lower abdomen was prepped and draped in the usual sterile fashion. 1% lidocaine was used for local anesthesia. Following this, a 8 Fr Safe-T-Centesis catheter was introduced. An ultrasound image was saved for documentation purposes. The paracentesis was performed. The catheter was removed and a dressing was applied. The patient tolerated the procedure well without immediate post procedural complication. Patient did not receive intravenous albumin FINDINGS: A total of approximately 4.0 of serous ascitic fluid was removed. Samples were sent to the laboratory as requested by the clinical team. IMPRESSION: Successful ultrasound-guided paracentesis yielding 4.0 liters of ascites. Michaelle Birks, MD [facility] Electronically Signed   By: Michaelle Birks M.D.   On: 04/08/2021 18:21   DG Chest Portable 1 View  Result Date: 04/14/2021 CLINICAL DATA:  Short of breath, cirrhosis, abdominal distension EXAM: PORTABLE CHEST 1 VIEW COMPARISON:  09/05/2018 FINDINGS: Single frontal view of the chest demonstrates right chest wall port via internal jugular approach tip overlying superior vena cava. The cardiac silhouette is unremarkable. No airspace disease, effusion, or pneumothorax. IMPRESSION: 1. No acute intrathoracic process. Electronically Signed   By: Randa Ngo M.D.    On: 04/14/2021 23:46   MR TOTAL SPINE METS SCREENING  Result Date: 04/12/2021 CLINICAL DATA:  Renal carcinoma. . Kidney CA, hx radiation therapy x 3 months ago; previous c-spine surgery 2015; mid-back pain x 2-3 months; known tumor in spine /// EXAM: MRI TOTAL SPINE WITHOUT AND WITH CONTRAST TECHNIQUE: Multisequence MR imaging of the spine from the cervical spine to the sacrum was performed prior to and following IV contrast administration for evaluation of spinal metastatic disease. CONTRAST:  63m GADAVIST GADOBUTROL 1 MMOL/ML IV SOLN COMPARISON:  None. FINDINGS: Alignment:  Physiologic. Vertebrae: There is a hyperenhancing lesion within the posterior T9 vertebral body. No pathologic fracture. There is a much smaller lesion within the posterior T8 vertebral body. No other contrast-enhancing lesions of the spine. Cord:  Normal signal and morphology. Disc levels: No spinal canal stenosis. IMPRESSION: 1. Enhancing lesion within the posterior T9 vertebral body, consistent with metastatic disease. No pathologic fracture. 2. Nonspecific focus of contrast enhancement  in the posterior T8 vertebral body, which may be a small metastatic lesion. 3. No spinal canal or neural foraminal stenosis. Electronically Signed   By: Ulyses Jarred M.D.   On: 04/12/2021 03:40    Microbiology: Recent Results (from the past 240 hour(s))  Resp Panel by RT-PCR (Flu A&B, Covid) Nasopharyngeal Swab     Status: None   Collection Time: 04/15/21 12:32 AM   Specimen: Nasopharyngeal Swab; Nasopharyngeal(NP) swabs in vial transport medium  Result Value Ref Range Status   SARS Coronavirus 2 by RT PCR NEGATIVE NEGATIVE Final    Comment: (NOTE) SARS-CoV-2 target nucleic acids are NOT DETECTED.  The SARS-CoV-2 RNA is generally detectable in upper respiratory specimens during the acute phase of infection. The lowest concentration of SARS-CoV-2 viral copies this assay can detect is 138 copies/mL. A negative result does not preclude  SARS-Cov-2 infection and should not be used as the sole basis for treatment or other patient management decisions. A negative result may occur with  improper specimen collection/handling, submission of specimen other than nasopharyngeal swab, presence of viral mutation(s) within the areas targeted by this assay, and inadequate number of viral copies(<138 copies/mL). A negative result must be combined with clinical observations, patient history, and epidemiological information. The expected result is Negative.  Fact Sheet for Patients:  EntrepreneurPulse.com.au  Fact Sheet for Healthcare Providers:  IncredibleEmployment.be  This test is no t yet approved or cleared by the [place] FDA and  has been authorized for detection and/or diagnosis of SARS-CoV-2 by FDA under an Emergency Use Authorization (EUA). This EUA will remain  in effect (meaning this test can be used) for the duration of the COVID-19 declaration under Section 564(b)(1) of the Act, 21 U.S.C.section 360bbb-3(b)(1), unless the authorization is terminated  or revoked sooner.       Influenza A by PCR NEGATIVE NEGATIVE Final   Influenza B by PCR NEGATIVE NEGATIVE Final    Comment: (NOTE) The Xpert Xpress SARS-CoV-2/FLU/RSV plus assay is intended as an aid in the diagnosis of influenza from Nasopharyngeal swab specimens and should not be used as a sole basis for treatment. Nasal washings and aspirates are unacceptable for Xpert Xpress SARS-CoV-2/FLU/RSV testing.  Fact Sheet for Patients: EntrepreneurPulse.com.au  Fact Sheet for Healthcare Providers: IncredibleEmployment.be  This test is not yet approved or cleared by the [place] FDA and has been authorized for detection and/or diagnosis of SARS-CoV-2 by FDA under an Emergency Use Authorization (EUA). This EUA will remain in effect (meaning this test can be used) for the duration of  the COVID-19 declaration under Section 564(b)(1) of the Act, 21 U.S.C. section 360bbb-3(b)(1), unless the authorization is terminated or revoked.  Performed at [facility], [address] [place], [place] [address]   Aerobic/Anaerobic Culture w Gram Stain (surgical/deep wound)     Status: None (Preliminary result)   Collection Time: 04/15/21 10:30 AM   Specimen: PATH Cytology Peritoneal fluid  Result Value Ref Range Status   Specimen Description   Final    PERITONEAL Performed at [facility], [address], [place], [place] [address]    Special Requests   Final    NONE Performed at [facility], [address] [place], [place] [address]    Gram Stain   Final    RARE WBC PRESENT, PREDOMINANTLY MONONUCLEAR NO ORGANISMS SEEN Performed at [facility], [place] [address], [place], [place] [address]    Culture PENDING  Incomplete   Report Status PENDING  Incomplete  Labs: Basic Metabolic Panel: Recent Labs  Lab 04/14/21 1656 04/15/21 1118 04/16/21 0453  NA 132* 135 134*  K 5.1 4.5 4.2  CL 105 107 108  CO2 21* 19* 20*  GLUCOSE 140* 124* 95  BUN 32* 34* 36*  CREATININE 1.75* 1.48* 1.92*  CALCIUM 8.5* 8.1* 7.7*   Liver Function Tests: Recent Labs  Lab 04/14/21 1656  AST 36  ALT 23  ALKPHOS 86  BILITOT 1.0  PROT 6.4*  ALBUMIN 2.9*   No results for input(s): LIPASE, AMYLASE in the last 168 hours. No results for input(s): AMMONIA in the last 168 hours. CBC: Recent Labs  Lab 04/14/21 1656 04/15/21 1118 04/16/21 0453  WBC 4.5 4.8 3.7*  HGB 10.5* 9.9* 9.2*  HCT 30.7* 30.1* 26.8*  MCV 91.9 91.5 91.5  PLT 110* 99* 94*   Cardiac Enzymes: No results for input(s): CKTOTAL, CKMB, CKMBINDEX, TROPONINI in the last 168 hours. BNP: BNP (last 3 results) No results for input(s): BNP in the last 8760 hours.  ProBNP (last 3 results) No results for input(s): PROBNP in the last 8760 hours.  CBG: Recent Labs  Lab 04/15/21 0304  04/15/21 0838 04/15/21 1143 04/15/21 2130 04/16/21 0954  GLUCAP 107* 116* 121* 128* 97       Signed:  Desma Maxim MD.  [facility] 04/16/2021, 10:47 AM

## 2021-04-18 LAB — CYTOLOGY - NON PAP

## 2021-04-19 ENCOUNTER — Telehealth: Payer: Self-pay

## 2021-04-19 NOTE — Telephone Encounter (Signed)
-----   Message from Virgel Manifold, MD sent at 04/16/2021 11:09 AM EDT ----- Rinaldo Cloud, Please make clinic follow-up with Dr. [name] for cirrhosis

## 2021-04-19 NOTE — Telephone Encounter (Signed)
Made appointment 05/11/2021 at 8:45am

## 2021-04-20 LAB — AEROBIC/ANAEROBIC CULTURE W GRAM STAIN (SURGICAL/DEEP WOUND): Culture: NO GROWTH

## 2021-04-22 DIAGNOSIS — C7949 Secondary malignant neoplasm of other parts of nervous system: Secondary | ICD-10-CM | POA: Insufficient documentation

## 2021-05-04 ENCOUNTER — Encounter: Payer: Self-pay | Admitting: Oncology

## 2021-05-05 ENCOUNTER — Inpatient Hospital Stay: Payer: BC Managed Care – PPO

## 2021-05-05 ENCOUNTER — Inpatient Hospital Stay: Payer: BC Managed Care – PPO | Admitting: Oncology

## 2021-05-05 ENCOUNTER — Encounter: Payer: Self-pay | Admitting: Oncology

## 2021-05-06 ENCOUNTER — Ambulatory Visit
Admission: RE | Admit: 2021-05-06 | Discharge: 2021-05-06 | Disposition: A | Discharge status: Home / Self Care | Payer: Medicare Other | Source: Ambulatory Visit | Attending: Oncology | Admitting: Oncology

## 2021-05-06 ENCOUNTER — Encounter: Payer: Self-pay | Admitting: Oncology

## 2021-05-06 ENCOUNTER — Other Ambulatory Visit: Payer: Self-pay

## 2021-05-06 ENCOUNTER — Telehealth: Payer: Self-pay

## 2021-05-06 DIAGNOSIS — R0602 Shortness of breath: Secondary | ICD-10-CM | POA: Diagnosis not present

## 2021-05-06 DIAGNOSIS — R180 Malignant ascites: Secondary | ICD-10-CM | POA: Diagnosis present

## 2021-05-06 DIAGNOSIS — C801 Malignant (primary) neoplasm, unspecified: Secondary | ICD-10-CM | POA: Insufficient documentation

## 2021-05-06 NOTE — Telephone Encounter (Signed)
Patient's sister Kendrick Fries called this afternoon stating that she has not heard back about "fluid removal from his belly." Dr. Tasia Catchings ordered STAT Para and patient was worked it to have one this afternoon.

## 2021-05-06 NOTE — Telephone Encounter (Signed)
-----   Message from Earlie Server, MD sent at 05/06/2021  3:03 PM EDT -----  ----- Message ----- From: [name], MD Sent: 05/05/2021   1:23 PM EDT To: Loleta Dicker, PA, Wayna Chalet, CMA, #  Herb Grays  It has been over 2 years since I seen the patient.  Maritza please arrange for abdominal paracentesis with labs.  No limit in amount of fluid to be taken out.  If over 5 L taken out to 25%, 25 g of albumin x1.  Follow-up with me next week in the office.   Kiran ----- Message ----- From: Virgel Manifold, MD Sent: 05/05/2021   1:01 PM EDT To: Loleta Dicker, PA, [name], MD, #  Dr. [name] is seeing this pt. Forwarding the message to him. Thank you  ----- Message ----- From: Loleta Dicker, PA Sent: 05/05/2021  12:35 PM EDT To: Earlie Server, MD, Virgel Manifold, MD  I saw Mr. Henry Moore this morning for his post-op follow up from his surgery with Dr. Izora Ribas. He has persistently worsening trouble with nausea and vomiting as well as abdominal distention concerning for ascites.  He was hoping to get a paracentesis done as soon as possible and asked that I reached out to you all to see if this could be coordinated. Please let me know if there is anything that I can do to help.  Andee Poles

## 2021-05-09 ENCOUNTER — Encounter: Payer: Self-pay | Admitting: Oncology

## 2021-05-09 NOTE — Telephone Encounter (Signed)
Henry Moore check with radiology:he needs a paracentesis and I will be seeing him in 2 days and will decide on future paracentesis

## 2021-05-10 ENCOUNTER — Encounter: Payer: Self-pay | Admitting: Oncology

## 2021-05-10 ENCOUNTER — Telehealth: Payer: Self-pay

## 2021-05-10 NOTE — Telephone Encounter (Signed)
Patient was contacted and Dr. [name] will see him today for an appointment to discuss if he is in need to continuing paracentesis or not.

## 2021-05-10 NOTE — Telephone Encounter (Signed)
Henry Moore, Dr. [name] stated that he would make a decision about scheduling a standing order on doing paracentesis. He will see the patient tomorrow and then he will decide if he will need a standing order or not. Thank you.

## 2021-05-10 NOTE — Telephone Encounter (Signed)
-----   Message from [name], MD sent at 05/05/2021  1:22 PM EDT ----- Herb Grays  It has been over 2 years since I seen the patient.  Reneta Niehaus please arrange for abdominal paracentesis with labs.  No limit in amount of fluid to be taken out.  If over 5 L taken out to 25%, 25 g of albumin x1.  Follow-up with me next week in the office.   Kiran ----- Message ----- From: Virgel Manifold, MD Sent: 05/05/2021   1:01 PM EDT To: Loleta Dicker, PA, [name], MD, #  Dr. [name] is seeing this pt. Forwarding the message to him. Thank you  ----- Message ----- From: Loleta Dicker, PA Sent: 05/05/2021  12:35 PM EDT To: Earlie Server, MD, Virgel Manifold, MD  I saw Mr. Culpepper this morning for his post-op follow up from his surgery with Dr. Izora Ribas. He has persistently worsening trouble with nausea and vomiting as well as abdominal distention concerning for ascites.  He was hoping to get a paracentesis done as soon as possible and asked that I reached out to you all to see if this could be coordinated. Please let me know if there is anything that I can do to help.  Andee Poles

## 2021-05-11 ENCOUNTER — Ambulatory Visit (INDEPENDENT_AMBULATORY_CARE_PROVIDER_SITE_OTHER): Payer: BC Managed Care – PPO | Admitting: Gastroenterology

## 2021-05-11 ENCOUNTER — Encounter: Payer: Self-pay | Admitting: Gastroenterology

## 2021-05-11 ENCOUNTER — Encounter: Payer: Self-pay | Admitting: Oncology

## 2021-05-11 ENCOUNTER — Other Ambulatory Visit: Payer: Self-pay

## 2021-05-11 ENCOUNTER — Other Ambulatory Visit: Payer: Self-pay | Admitting: Gastroenterology

## 2021-05-11 VITALS — BP 154/85 | HR 82 | Temp 97.8°F | Ht 73.0 in

## 2021-05-11 DIAGNOSIS — M6284 Sarcopenia: Secondary | ICD-10-CM

## 2021-05-11 DIAGNOSIS — K7460 Unspecified cirrhosis of liver: Secondary | ICD-10-CM | POA: Diagnosis not present

## 2021-05-11 DIAGNOSIS — R188 Other ascites: Secondary | ICD-10-CM

## 2021-05-11 DIAGNOSIS — K7682 Hepatic encephalopathy: Secondary | ICD-10-CM

## 2021-05-11 MED ORDER — ALBUMIN HUMAN 25 % IV SOLN: 25.0000 g | Freq: Once | INTRAVENOUS | Status: DC

## 2021-05-11 MED ORDER — ALBUMIN HUMAN 25 % IV SOLN: 25.0000 g | Freq: Once | INTRAVENOUS | Status: AC

## 2021-05-11 MED ORDER — RIFAXIMIN 550 MG PO TABS: 550.0000 mg | ORAL_TABLET | Freq: Two times a day (BID) | ORAL | 11 refills | Status: DC

## 2021-05-11 NOTE — Progress Notes (Signed)
[name] MD, MRCP(U.K) [address]  [place]  [place], [place] [address]  Main: [phone]  Fax: [phone]   Primary Care Physician: Albina Billet, MD  Primary Gastroenterologist:  Dr. [name]    C/c : Liver cirrhosis follow-up   HPI: Henry Moore is a 60 y.o. male Summary of history : He initially diagnosed with cirrhosis of the liver in March 2020 and was seen by [place] .  Likely secondary to excess alcohol consumption that he had stopped in 2019.  History of nonbleeding esophageal varices that were banded in 2020 along with portal hypertensive gastropathy.   Underwent laparoscopic nephrectomy in 10/18/2018. 10/15/2018: Labs are negative for autoimmune hepatitis and viral hepatitis.  Not immune to hepatitis A and B.  04/22/2019: EGD: Grade 1 esophageal varices that were not requiring any banding.  Portal hypertensive gastropathy. 03/11/2019 right upper quadrant ultrasound: Cirrhotic liver but no masses seen   04/17/2019: [facility] hepatology visit.  Was referred as he had an elevated meld score.    Interval history 05/01/2019-05/11/2021  He has not followed up for over 2 years at our office.  Came to the hospital on 04/14/2021 with ascites.  Underwent paracentesis, no SBP.  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  He has been having diarrhea negative for infection likely secondary to immunotherapy.  Did not tolerate steroids well.Commenced on Aldactone 100 mg and Lasix 40 mg at discharge.  02/21/2021 ultrasound abdomen shows cirrhosis with splenomegaly and small amount of ascites 04/16/2021 Doppler ultrasound shows no vascular thrombosis  Says he has been gaining weight and had to have paracentesis a few days back.  Feels dizzy at times.  Has nausea when the fluid builds up.  Has diarrhea only when the ascites builds back on.  Taking his Lasix and Aldactone.  On a low-salt diet.  Does not consume any fast food or junk food.  No NSAID  use.  Consuming 1 protein shake with 30 g of protein per day.    Current Outpatient Medications  Medication Sig Dispense Refill   ACCU-CHEK AVIVA PLUS test strip CHECK BL00D SUGAR ONCE OR TWICE DAILY. 100 each PRN   calcium carbonate (OS-CAL - DOSED IN MG OF ELEMENTAL CALCIUM) 1250 (500 Ca) MG tablet Take 1 tablet by mouth.     carvedilol (COREG) 25 MG tablet Take 1 tablet (25 mg total) by mouth 2 (two) times daily with a meal. 180 tablet 3   Cholecalciferol (VITAMIN D3) 125 MCG (5000 UT) CAPS Take 5,000 Units by mouth daily.     furosemide (LASIX) 40 MG tablet Take 1 tablet (40 mg total) by mouth daily. 30 tablet 1   loperamide (IMODIUM A-D) 2 MG tablet Take 2 mg by mouth 4 (four) times daily as needed for diarrhea or loose stools.     pravastatin (PRAVACHOL) 20 MG tablet Take 1 tablet (20 mg total) by mouth every evening. 30 tablet 11   spironolactone (ALDACTONE) 100 MG tablet Take 1 tablet (100 mg total) by mouth daily. 30 tablet 1   prochlorperazine (COMPAZINE) 10 MG tablet Take 1 tablet (10 mg total) by mouth every 6 (six) hours as needed for nausea or vomiting. (Patient not taking: Reported on 05/11/2021) 30 tablet 0   vitamin B-12 (CYANOCOBALAMIN) 1000 MCG tablet Take 1 tablet (1,000 mcg total) by mouth daily. (Patient not taking: Reported on 05/11/2021) 90 tablet 0   No current facility-administered medications for this visit.   Facility-Administered  Medications Ordered in Other Visits  Medication Dose Route Frequency Provider Last Rate Last Admin   sodium chloride flush (NS) 0.9 % injection 10 mL  10 mL Intravenous PRN Earlie Server, MD   10 mL at 01/22/20 0831    Allergies as of 05/11/2021 - Review Complete 05/11/2021  Allergen Reaction Noted   Codeine Itching 04/14/2014   Mucinex [guaifenesin er] Anxiety 05/21/2017    ROS:  General: Negative for anorexia, weight loss, fever, chills, fatigue, weakness. ENT: Negative for hoarseness, difficulty swallowing , nasal congestion. CV:  Negative for chest pain, angina, palpitations, dyspnea on exertion, peripheral edema.  Respiratory: Negative for dyspnea at rest, dyspnea on exertion, cough, sputum, wheezing.  GI: See history of present illness. GU:  Negative for dysuria, hematuria, urinary incontinence, urinary frequency, nocturnal urination.  Endo: Negative for unusual weight change.    Physical Examination:   BP (!) 154/85   Pulse 82   Temp 97.8 F (36.6 C) (Oral)   Ht 6\' 1"  (1.854 m)   BMI 33.75 kg/m   General: Appears tired loss of muscle mass Eyes: No icterus. Conjunctivae pink. Mouth: Oropharyngeal mucosa moist and pink , no lesions erythema or exudate. Lungs: Clear to auscultation bilaterally. Non-labored. Heart: Regular rate and rhythm, no murmurs rubs or gallops.  Abdomen: Bowel sounds are normal, nontender, nondistended, no hepatosplenomegaly or masses, no abdominal bruits or hernia , no rebound or guarding.   Extremities: Thin Neuro: Alert and oriented x 3.  Grossly intact. Skin: Warm and dry, no jaundice.   Psych: Alert and cooperative, normal mood and affect.   Imaging Studies: US Paracentesis  Result Date: 05/06/2021 INDICATION: ascites, shortness of breath EXAM: ULTRASOUND GUIDED  PARACENTESIS MEDICATIONS: None. COMPLICATIONS: None immediate. PROCEDURE: Informed written consent was obtained from the patient after a discussion of the risks, benefits and alternatives to treatment. A timeout was performed prior to the initiation of the procedure. Initial ultrasound scanning demonstrates a large amount of ascites within the right lower abdominal quadrant. The right lower abdomen was prepped and draped in the usual sterile fashion. 1% lidocaine was used for local anesthesia. Following this, a 19 gauge Yueh catheter was introduced. An ultrasound image was saved for documentation purposes. The paracentesis was performed. The catheter was removed and a dressing was applied. The patient tolerated the procedure  well without immediate post procedural complication. FINDINGS: A total of approximately 4 L of clear, straw-colored peritoneal fluid was removed. IMPRESSION: Successful ultrasound-guided paracentesis yielding 4 liters of clear peritoneal fluid. Electronically Signed   By: Albin Felling M.D.   On: 05/06/2021 17:18   US Paracentesis  Result Date: 04/15/2021 INDICATION: Recurrent ascites secondary to cirrhosis. Request for diagnostic and therapeutic paracentesis. EXAM: ULTRASOUND GUIDED PARACENTESIS MEDICATIONS: 1% lidocaine 10 mL COMPLICATIONS: None immediate. PROCEDURE: Informed written consent was obtained from the patient after a discussion of the risks, benefits and alternatives to treatment. A timeout was performed prior to the initiation of the procedure. Initial ultrasound scanning demonstrates a large amount of ascites within the right lateral abdomen. The right lateral abdomen was prepped and draped in the usual sterile fashion. 1% lidocaine was used for local anesthesia. Following this, a 6 Fr Safe-T-Centesis catheter was introduced. An ultrasound image was saved for documentation purposes. The paracentesis was performed. The catheter was removed and a dressing was applied. The patient tolerated the procedure well without immediate post procedural complication. FINDINGS: A total of approximately 5 L of clear yellow fluid was removed. Samples were sent to the laboratory  as requested by the clinical team. IMPRESSION: Successful ultrasound-guided paracentesis yielding 5 liters of peritoneal fluid. Read by: Gareth Eagle, PA-C Electronically Signed   By: Michaelle Birks M.D.   On: 04/15/2021 12:19   DG Chest Portable 1 View  Result Date: 04/14/2021 CLINICAL DATA:  Short of breath, cirrhosis, abdominal distension EXAM: PORTABLE CHEST 1 VIEW COMPARISON:  09/05/2018 FINDINGS: Single frontal view of the chest demonstrates right chest wall port via internal jugular approach tip overlying superior vena cava. The  cardiac silhouette is unremarkable. No airspace disease, effusion, or pneumothorax. IMPRESSION: 1. No acute intrathoracic process. Electronically Signed   By: Randa Ngo M.D.   On: 04/14/2021 23:46   MR TOTAL SPINE METS SCREENING  Result Date: 04/12/2021 CLINICAL DATA:  Renal carcinoma. . Kidney CA, hx radiation therapy x 3 months ago; previous c-spine surgery 2015; mid-back pain x 2-3 months; known tumor in spine /// EXAM: MRI TOTAL SPINE WITHOUT AND WITH CONTRAST TECHNIQUE: Multisequence MR imaging of the spine from the cervical spine to the sacrum was performed prior to and following IV contrast administration for evaluation of spinal metastatic disease. CONTRAST:  4mL GADAVIST GADOBUTROL 1 MMOL/ML IV SOLN COMPARISON:  None. FINDINGS: Alignment:  Physiologic. Vertebrae: There is a hyperenhancing lesion within the posterior T9 vertebral body. No pathologic fracture. There is a much smaller lesion within the posterior T8 vertebral body. No other contrast-enhancing lesions of the spine. Cord:  Normal signal and morphology. Disc levels: No spinal canal stenosis. IMPRESSION: 1. Enhancing lesion within the posterior T9 vertebral body, consistent with metastatic disease. No pathologic fracture. 2. Nonspecific focus of contrast enhancement in the posterior T8 vertebral body, which may be a small metastatic lesion. 3. No spinal canal or neural foraminal stenosis. Electronically Signed   By: Ulyses Jarred M.D.   On: 04/12/2021 03:40   US LIVER DOPPLER  Result Date: 04/16/2021 CLINICAL DATA:  Ascites abdominal pain.  Rule out venous thrombosis. EXAM: DUPLEX ULTRASOUND OF LIVER TECHNIQUE: Color and duplex Doppler ultrasound was performed to evaluate the hepatic in-flow and out-flow vessels. COMPARISON:  IR ultrasound, 04/15/2021. CT of orbits, 02/01/2021 and 10/12/2020. FINDINGS: Liver: Increased hepatic parenchymal echogenicity, and macronodular contour. No focal lesion, mass or intrahepatic biliary ductal  dilatation. Main Portal Vein size: 2.9 cm Portal Vein Velocities Main Prox:  31 cm/sec Main Mid: 40 cm/sec Main Dist:  39 cm/sec Right: 29 cm/sec Left: 31 cm/sec Hepatic Vein Velocities Right:  30 cm/sec Middle:  32 cm/sec Left:  29 cm/sec IVC: Present and patent with normal respiratory phasicity. Hepatic Artery Velocity:  90 cm/sec Splenic Vein Velocity:  39 cm/sec Spleen: 19 x 14 x 7 cm with a total volume of 969 cm^3 (411 cm^3 is upper limit normal) Portal Vein Occlusion/Thrombus: No Splenic Vein Occlusion/Thrombus: No Ascites: Trace residual intra-abdominal ascites. Recent paracentesis the day prior, on 04/15/2021. Varices: None IMPRESSION: 1. No sonographic evidence of portal or hepatic venous obstruction. 2. Cirrhotic morphology of liver with splenomegaly and portal venous velocity at the upper limit of normal, consistent with portal venous hypertension. 3. Trace residual intra-abdominal ascites, status post recent paracentesis. Michaelle Birks, MD [facility] Electronically Signed   By: Michaelle Birks M.D.   On: 04/16/2021 10:12    Assessment and Plan:   AVEDIS BEVIS is a 60 y.o. y/o male here to follow-up for cirrhosis of the liver incidentally diagnosed in March 2020.  Has had esophageal varices without bleeding that have been banded and eradicated.  Weight has been stable.  Likely NAFLD/alcoholic fatty liver disease.  Doing well.    As his meld score was 15 was referred to [facility] and has been evaluated.  Has been doing well.  Meld score of 16 on 04/16/2021  Plan 1.  Check immune status hepatitis A and B status postvaccination 2. Low salt diet.  Recheck labs t today since you are started on diuretics 3.  Stay off all alcohol and NSAIDs 4.  USG liver to screen for [facility] February 2022 5. EGD to screen for varices we will discuss next visit in 2 to 3 weeks when his ascites is under better control and have labs 6.  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  I will titrate his diuretics after obtain labs, standing order for paracentesis not to exceed 3 episodes and with each episode of paracentesis 25 g of 25% albumin will be given. 8.  He is not a candidate for liver transplant due to metastatic RCC 9.  Sarcopenia continue consuming high-protein shake and resume once to twice a day to get about 60 g of protein at least through this   Dr [name]  MD,MRCP [facility]) Follow up in 2 to 3 weeks

## 2021-05-12 ENCOUNTER — Inpatient Hospital Stay: Payer: BC Managed Care – PPO | Attending: Oncology

## 2021-05-12 ENCOUNTER — Encounter: Payer: Self-pay | Admitting: Oncology

## 2021-05-12 ENCOUNTER — Inpatient Hospital Stay: Payer: BC Managed Care – PPO

## 2021-05-12 ENCOUNTER — Telehealth: Payer: Self-pay | Admitting: *Deleted

## 2021-05-12 ENCOUNTER — Other Ambulatory Visit: Payer: Self-pay

## 2021-05-12 ENCOUNTER — Ambulatory Visit
Admission: RE | Admit: 2021-05-12 | Discharge: 2021-05-12 | Disposition: A | Discharge status: Home / Self Care | Payer: Medicare Other | Source: Ambulatory Visit | Attending: Gastroenterology | Admitting: Gastroenterology

## 2021-05-12 ENCOUNTER — Inpatient Hospital Stay (HOSPITAL_BASED_OUTPATIENT_CLINIC_OR_DEPARTMENT_OTHER): Payer: BC Managed Care – PPO | Admitting: Hospice and Palliative Medicine

## 2021-05-12 ENCOUNTER — Other Ambulatory Visit: Payer: Self-pay | Admitting: Gastroenterology

## 2021-05-12 ENCOUNTER — Inpatient Hospital Stay (HOSPITAL_BASED_OUTPATIENT_CLINIC_OR_DEPARTMENT_OTHER): Payer: BC Managed Care – PPO | Admitting: Oncology

## 2021-05-12 VITALS — BP 90/57 | HR 75 | Temp 97.5°F | Wt 215.5 lb

## 2021-05-12 DIAGNOSIS — C642 Malignant neoplasm of left kidney, except renal pelvis: Secondary | ICD-10-CM

## 2021-05-12 DIAGNOSIS — K7460 Unspecified cirrhosis of liver: Secondary | ICD-10-CM

## 2021-05-12 DIAGNOSIS — Z87891 Personal history of nicotine dependence: Secondary | ICD-10-CM | POA: Insufficient documentation

## 2021-05-12 DIAGNOSIS — Z79899 Other long term (current) drug therapy: Secondary | ICD-10-CM | POA: Diagnosis not present

## 2021-05-12 DIAGNOSIS — Z515 Encounter for palliative care: Secondary | ICD-10-CM

## 2021-05-12 DIAGNOSIS — K7682 Hepatic encephalopathy: Secondary | ICD-10-CM

## 2021-05-12 DIAGNOSIS — E119 Type 2 diabetes mellitus without complications: Secondary | ICD-10-CM | POA: Insufficient documentation

## 2021-05-12 DIAGNOSIS — R109 Unspecified abdominal pain: Secondary | ICD-10-CM | POA: Insufficient documentation

## 2021-05-12 DIAGNOSIS — G893 Neoplasm related pain (acute) (chronic): Secondary | ICD-10-CM | POA: Diagnosis not present

## 2021-05-12 DIAGNOSIS — R7989 Other specified abnormal findings of blood chemistry: Secondary | ICD-10-CM

## 2021-05-12 DIAGNOSIS — I10 Essential (primary) hypertension: Secondary | ICD-10-CM | POA: Insufficient documentation

## 2021-05-12 DIAGNOSIS — R188 Other ascites: Secondary | ICD-10-CM | POA: Diagnosis present

## 2021-05-12 DIAGNOSIS — Z7189 Other specified counseling: Secondary | ICD-10-CM

## 2021-05-12 DIAGNOSIS — M6284 Sarcopenia: Secondary | ICD-10-CM | POA: Insufficient documentation

## 2021-05-12 DIAGNOSIS — R112 Nausea with vomiting, unspecified: Secondary | ICD-10-CM

## 2021-05-12 DIAGNOSIS — C7951 Secondary malignant neoplasm of bone: Secondary | ICD-10-CM | POA: Diagnosis not present

## 2021-05-12 DIAGNOSIS — D696 Thrombocytopenia, unspecified: Secondary | ICD-10-CM

## 2021-05-12 DIAGNOSIS — Z803 Family history of malignant neoplasm of breast: Secondary | ICD-10-CM | POA: Diagnosis not present

## 2021-05-12 DIAGNOSIS — E785 Hyperlipidemia, unspecified: Secondary | ICD-10-CM | POA: Diagnosis not present

## 2021-05-12 DIAGNOSIS — Z800 Family history of malignant neoplasm of digestive organs: Secondary | ICD-10-CM | POA: Diagnosis not present

## 2021-05-12 DIAGNOSIS — D631 Anemia in chronic kidney disease: Secondary | ICD-10-CM

## 2021-05-12 DIAGNOSIS — N1832 Chronic kidney disease, stage 3b: Secondary | ICD-10-CM | POA: Diagnosis not present

## 2021-05-12 LAB — CBC WITH DIFFERENTIAL/PLATELET
Abs Immature Granulocytes: 0.03 10*3/uL (ref 0.00–0.07)
Basophils Absolute: 0.1 10*3/uL (ref 0.0–0.2)
Basophils Absolute: 0 10*3/uL (ref 0.0–0.1)
Basophils Relative: 1 %
Basos: 2 %
EOS (ABSOLUTE): 0.3 10*3/uL (ref 0.0–0.4)
Eos: 7 %
Eosinophils Absolute: 0.2 10*3/uL (ref 0.0–0.5)
Eosinophils Relative: 9 %
HCT: 31.6 % — ABNORMAL LOW (ref 39.0–52.0)
Hematocrit: 34.1 % — ABNORMAL LOW (ref 37.5–51.0)
Hemoglobin: 11.1 g/dL — ABNORMAL LOW (ref 13.0–17.7)
Hemoglobin: 10.3 g/dL — ABNORMAL LOW (ref 13.0–17.0)
Immature Grans (Abs): 0 10*3/uL (ref 0.0–0.1)
Immature Granulocytes: 1 %
Immature Granulocytes: 1 %
Lymphocytes Absolute: 0.4 10*3/uL — ABNORMAL LOW (ref 0.7–3.1)
Lymphocytes Relative: 14 %
Lymphs Abs: 0.4 10*3/uL — ABNORMAL LOW (ref 0.7–4.0)
Lymphs: 8 %
MCH: 30.4 pg (ref 26.6–33.0)
MCH: 30.5 pg (ref 26.0–34.0)
MCHC: 32.6 g/dL (ref 31.5–35.7)
MCHC: 32.6 g/dL (ref 30.0–36.0)
MCV: 93 fL (ref 79–97)
MCV: 93.5 fL (ref 80.0–100.0)
Monocytes Absolute: 0.4 10*3/uL (ref 0.1–0.9)
Monocytes Absolute: 0.3 10*3/uL (ref 0.1–1.0)
Monocytes Relative: 11 %
Monocytes: 9 %
Neutro Abs: 1.8 10*3/uL (ref 1.7–7.7)
Neutrophils Absolute: 3.2 10*3/uL (ref 1.4–7.0)
Neutrophils Relative %: 64 %
Neutrophils: 73 %
Platelets: 145 10*3/uL — ABNORMAL LOW (ref 150–450)
Platelets: 116 10*3/uL — ABNORMAL LOW (ref 150–400)
RBC: 3.65 x10E6/uL — ABNORMAL LOW (ref 4.14–5.80)
RBC: 3.38 MIL/uL — ABNORMAL LOW (ref 4.22–5.81)
RDW: 15.3 % (ref 11.6–15.4)
RDW: 15.9 % — ABNORMAL HIGH (ref 11.5–15.5)
WBC: 4.4 10*3/uL (ref 3.4–10.8)
WBC: 2.8 10*3/uL — ABNORMAL LOW (ref 4.0–10.5)
nRBC: 0 % (ref 0.0–0.2)

## 2021-05-12 LAB — T4, FREE: Free T4: 1.1 ng/dL (ref 0.61–1.12)

## 2021-05-12 LAB — COMPREHENSIVE METABOLIC PANEL
ALT: 36 IU/L (ref 0–44)
ALT: 31 U/L (ref 0–44)
AST: 52 IU/L — ABNORMAL HIGH (ref 0–40)
AST: 41 U/L (ref 15–41)
Albumin/Globulin Ratio: 1.1 — ABNORMAL LOW (ref 1.2–2.2)
Albumin: 3.7 g/dL — ABNORMAL LOW (ref 3.8–4.9)
Albumin: 3.2 g/dL — ABNORMAL LOW (ref 3.5–5.0)
Alkaline Phosphatase: 196 IU/L — ABNORMAL HIGH (ref 44–121)
Alkaline Phosphatase: 136 U/L — ABNORMAL HIGH (ref 38–126)
Anion gap: 8 (ref 5–15)
BUN/Creatinine Ratio: 19 (ref 9–20)
BUN: 34 mg/dL — ABNORMAL HIGH (ref 6–24)
BUN: 39 mg/dL — ABNORMAL HIGH (ref 6–20)
Bilirubin Total: 0.6 mg/dL (ref 0.0–1.2)
CO2: 18 mmol/L — ABNORMAL LOW (ref 20–29)
CO2: 24 mmol/L (ref 22–32)
Calcium: 8.7 mg/dL (ref 8.7–10.2)
Calcium: 8.4 mg/dL — ABNORMAL LOW (ref 8.9–10.3)
Chloride: 101 mmol/L (ref 96–106)
Chloride: 100 mmol/L (ref 98–111)
Creatinine, Ser: 1.75 mg/dL — ABNORMAL HIGH (ref 0.76–1.27)
Creatinine, Ser: 1.83 mg/dL — ABNORMAL HIGH (ref 0.61–1.24)
GFR, Estimated: 42 mL/min — ABNORMAL LOW (ref >60)
Globulin, Total: 3.3 g/dL (ref 1.5–4.5)
Glucose, Bld: 138 mg/dL — ABNORMAL HIGH (ref 70–99)
Glucose: 146 mg/dL — ABNORMAL HIGH (ref 70–99)
Potassium: 4.5 mmol/L (ref 3.5–5.2)
Potassium: 3.9 mmol/L (ref 3.5–5.1)
Sodium: 136 mmol/L (ref 134–144)
Sodium: 132 mmol/L — ABNORMAL LOW (ref 135–145)
Total Bilirubin: 0.8 mg/dL (ref 0.3–1.2)
Total Protein: 7 g/dL (ref 6.0–8.5)
Total Protein: 7.1 g/dL (ref 6.5–8.1)
eGFR: 45 mL/min/{1.73_m2} — ABNORMAL LOW (ref >59)

## 2021-05-12 LAB — PROTIME-INR
INR: 1.2 (ref 0.9–1.2)
Prothrombin Time: 12.9 s — ABNORMAL HIGH (ref 9.1–12.0)

## 2021-05-12 LAB — HEPATITIS A ANTIBODY, TOTAL: hep A Total Ab: POSITIVE — AB

## 2021-05-12 LAB — TSH: TSH: 10.742 u[IU]/mL — ABNORMAL HIGH (ref 0.350–4.500)

## 2021-05-12 LAB — HEPATITIS B SURFACE ANTIBODY,QUALITATIVE: Hep B Surface Ab, Qual: NONREACTIVE

## 2021-05-12 MED ORDER — HEPARIN SOD (PORK) LOCK FLUSH 100 UNIT/ML IV SOLN
500.0000 [IU] | Freq: Once | INTRAVENOUS | Status: AC
  Administered 2021-05-12: 500 [IU] via INTRAVENOUS
  Filled 2021-05-12: qty 5

## 2021-05-12 MED ORDER — LEVOTHYROXINE SODIUM 25 MCG PO TABS: 25.0000 ug | ORAL_TABLET | Freq: Every day | ORAL | 0 refills | Status: DC

## 2021-05-12 MED ORDER — PROMETHAZINE HCL 25 MG PO TABS: 25.0000 mg | ORAL_TABLET | Freq: Four times a day (QID) | ORAL | 0 refills | Status: AC | PRN

## 2021-05-12 MED ORDER — SODIUM CHLORIDE 0.9% FLUSH
10.0000 mL | INTRAVENOUS | Status: DC | PRN
  Administered 2021-05-12: 10 mL via INTRAVENOUS
  Filled 2021-05-12: qty 10

## 2021-05-12 MED ORDER — FENTANYL 12 MCG/HR TD PT72: 1.0000 | MEDICATED_PATCH | TRANSDERMAL | 0 refills | Status: AC

## 2021-05-12 NOTE — Telephone Encounter (Signed)
Pharmacy called asking if patient is opioid tolerant, stating that they need to know this before filling Fentanyl prescription and also that patient needs PA for the fentanyl. Looks like his last Oxycodone prescription was in 2020

## 2021-05-12 NOTE — Progress Notes (Signed)
[place]  Telephone:([phone] [phone]   Name: Henry Moore Date: 05/12/2021 MRN: [id number]  DOB: 1961-06-17  Patient Care Team: Albina Billet, MD as PCP - General (Internal Medicine) Earlie Server, MD as Consulting Physician (Hematology and Oncology)    REASON FOR CONSULTATION: Henry Moore is a 60 y.o. 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  patient was referred to palliative care to help address goals and manage ongoing symptoms.  SOCIAL HISTORY:     reports that he quit smoking about 7 years ago. His smoking use included cigarettes. He has a 5.00 pack-year smoking history. He has never used smokeless tobacco. He reports that he does not currently use alcohol. He reports that he does not use drugs.   Patient lives at home with his wife, son, and daughter.  Both children are in their 22s.  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  He was disabled due to a back injury.  ADVANCE DIRECTIVES:  Does not have  CODE STATUS:   PAST MEDICAL HISTORY: Past Medical History:  Diagnosis Date   Cough    SINUS DRAINAGE CAUSING COUGHING , REPORTS CLEAR MUCOUS    Diabetes mellitus without complication ([place])    Family history of breast cancer    Family history of colon cancer    Family history of stomach cancer    HTN (hypertension)    Hyperlipemia    Left renal mass    Platelets decreased ([place])    DENIES UNSUAL BLEEDING    PONV (postoperative nausea and vomiting)    Renal cell carcinoma ([place]) 08/04/2019   WBC decreased     PAST SURGICAL HISTORY:  Past Surgical History:  Procedure Laterality Date   ANKLE SURGERY Left    ANTERIOR CERVICAL DECOMP/DISCECTOMY FUSION N/A 04/16/2014   Procedure: ANTERIOR CERVICAL DECOMPRESSION/DISCECTOMY FUSION  (ACDF C5-C7)   (2 LEVELS)      ;  Surgeon: Melina Schools, MD;  Location: [facility];  Service: Orthopedics;  Laterality: N/A;   APPENDECTOMY     BACK SURGERY     ESOPHAGOGASTRODUODENOSCOPY (EGD) WITH PROPOFOL N/A 02/06/2019   Procedure: ESOPHAGOGASTRODUODENOSCOPY (EGD) WITH PROPOFOL;  Surgeon: [name], MD;  Location[facility] ENDOSCOPY;  Service: Gastroenterology;  Laterality: N/A;   ESOPHAGOGASTRODUODENOSCOPY (EGD) WITH PROPOFOL N/A 03/04/2019   Procedure: ESOPHAGOGASTRODUODENOSCOPY (EGD) WITH PROPOFOL;  Surgeon: Lin Landsman, MD;  Location: [place];  Service: Gastroenterology;  Laterality: N/A;   ESOPHAGOGASTRODUODENOSCOPY (EGD) WITH PROPOFOL N/A 04/22/2019   Procedure: ESOPHAGOGASTRODUODENOSCOPY (EGD) WITH PROPOFOL;  Surgeon: [name], MD;  Location: [facility] ENDOSCOPY;  Service: Gastroenterology;  Laterality: N/A;   FRACTURE SURGERY     HYDROCELE EXCISION     KNEE ARTHROSCOPY Bilateral    LAPAROSCOPIC NEPHRECTOMY, HAND ASSISTED Left 10/16/2018   Procedure: LEFT HAND ASSISTED LAPAROSCOPIC NEPHRECTOMY;  Surgeon: Lucas Mallow, MD;  Location: [facility];  Service: Urology;  Laterality: Left;   NASAL SINUS SURGERY     X 2   PENILE PROSTHESIS IMPLANT  10 YEARS AGO   PORTA CATH INSERTION N/A 11/11/2019   Procedure: PORTA CATH INSERTION;  Surgeon: Katha Cabal, MD;  Location: [place] CV LAB;  Service: Cardiovascular;  Laterality: N/A;   SHOULDER SURGERY Left     HEMATOLOGY/ONCOLOGY HISTORY:  Oncology History  Renal cell carcinoma ([place])  07/14/2019 Cancer Staging  Staging form: Kidney, AJCC 8th Edition - Clinical stage from 07/14/2019: Stage IV (cTX, cNX, cM1) - Signed by Earlie Server, MD on 08/05/2020   08/04/2019 Initial Diagnosis   Renal cell carcinoma ([place])   08/14/2019 -  Chemotherapy   Patient is on Treatment Plan : RENAL CELL CARCINOMA Nivolumab / Ipilimumab Q21D     10/05/2019 Genetic Testing   Negative genetic testing. No pathogenic variants identified on the [facility] Multi-Cancer Panel. VUS in [place]  called  c.4922T>C (p.Leu1641Pro) identified. The report date is 10/05/2019.  The Multi-Cancer Panel offered by [facility] includes sequencing and/or deletion duplication testing of the following 85 genes: AIP, ALK, APC, ATM, AXIN2,BAP1,  BARD1, BLM, BMPR1A, BRCA1, BRCA2, BRIP1, CASR, CDC73, CDH1, CDK4, CDKN1B, CDKN1C, CDKN2A (p14ARF), CDKN2A (p16INK4a), CEBPA, CHEK2, CTNNA1, DICER1, DIS3L2, EGFR (c.2369C>T, p.Thr790Met variant only), EPCAM (Deletion/duplication testing only), FH, FLCN, GATA2, GPC3, GREM1 (Promoter region deletion/duplication testing only), HOXB13 (c.251G>A, p.Gly84Glu), HRAS, KIT, MAX, MEN1, MET, MITF (c.952G>A, p.Glu318Lys variant only), MLH1, MSH2, MSH3, MSH6, MUTYH, NBN, NF1, NF2, NTHL1, PALB2, PDGFRA, PHOX2B, PMS2, POLD1, POLE, POT1, PRKAR1A, PTCH1, PTEN, RAD50, RAD51C, RAD51D, RB1, RECQL4, RET, RNF43, RUNX1, SDHAF2, SDHA (sequence changes only), SDHB, SDHC, SDHD, SMAD4, SMARCA4, SMARCB1, SMARCE1, STK11, SUFU, TERC, TERT, TMEM127, TP53, TSC1, TSC2, VHL, WRN and WT1.      ALLERGIES:  is allergic to codeine and mucinex [guaifenesin er].  MEDICATIONS:  Current Outpatient Medications  Medication Sig Dispense Refill   ACCU-CHEK AVIVA PLUS test strip CHECK BL00D SUGAR ONCE OR TWICE DAILY. 100 each PRN   calcium carbonate (OS-CAL - DOSED IN MG OF ELEMENTAL CALCIUM) 1250 (500 Ca) MG tablet Take 1 tablet by mouth. (Patient not taking: Reported on 05/12/2021)     carvedilol (COREG) 25 MG tablet Take 1 tablet (25 mg total) by mouth 2 (two) times daily with a meal. (Patient not taking: Reported on 05/12/2021) 180 tablet 3   Cholecalciferol (VITAMIN D3) 125 MCG (5000 UT) CAPS Take 5,000 Units by mouth daily.     furosemide (LASIX) 40 MG tablet Take 1 tablet (40 mg total) by mouth daily. 30 tablet 1   loperamide (IMODIUM A-D) 2 MG tablet Take 2 mg by mouth 4 (four) times daily as needed for diarrhea or loose stools. (Patient not taking: Reported on 05/12/2021)     pravastatin (PRAVACHOL) 20 MG  tablet Take 1 tablet (20 mg total) by mouth every evening. 30 tablet 11   prochlorperazine (COMPAZINE) 10 MG tablet Take 1 tablet (10 mg total) by mouth every 6 (six) hours as needed for nausea or vomiting. (Patient not taking: Reported on 05/12/2021) 30 tablet 0   promethazine (PHENERGAN) 25 MG tablet Take 1 tablet (25 mg total) by mouth every 6 (six) hours as needed for nausea or vomiting. 30 tablet 0   rifaximin (XIFAXAN) 550 MG TABS tablet Take 1 tablet (550 mg total) by mouth 2 (two) times daily. 60 tablet 11   spironolactone (ALDACTONE) 100 MG tablet Take 1 tablet (100 mg total) by mouth daily. 30 tablet 1   vitamin B-12 (CYANOCOBALAMIN) 1000 MCG tablet Take 1 tablet (1,000 mcg total) by mouth daily. 90 tablet 0   Current Facility-Administered Medications  Medication Dose Route Frequency Provider Last Rate Last Admin   albumin human 25 % solution 25 g  25 g Intravenous Once [name], MD       Facility-Administered Medications Ordered in Other Visits  Medication Dose Route Frequency Provider Last Rate Last Admin   sodium chloride flush (NS) 0.9 % injection  10 mL  10 mL Intravenous PRN Earlie Server, MD   10 mL at 01/22/20 0831    VITAL SIGNS: There were no vitals taken for this visit. There were no vitals filed for this visit.  Estimated body mass index is 28.43 kg/m as calculated from the following:   Height as of 05/11/21: 6' 1"  (1.854 m).   Weight as of an earlier encounter on 05/12/21: 215 lb 8 oz (97.8 kg).  LABS: CBC:    Component Value Date/Time   WBC 2.8 (L) 05/12/2021 0807   HGB 10.3 (L) 05/12/2021 0807   HGB 11.1 (L) 05/11/2021 0932   HCT 31.6 (L) 05/12/2021 0807   HCT 34.1 (L) 05/11/2021 0932   PLT 116 (L) 05/12/2021 0807   PLT 145 (L) 05/11/2021 0932   MCV 93.5 05/12/2021 0807   MCV 93 05/11/2021 0932   NEUTROABS 1.8 05/12/2021 0807   NEUTROABS 3.2 05/11/2021 0932   LYMPHSABS 0.4 (L) 05/12/2021 0807   LYMPHSABS 0.4 (L) 05/11/2021 0932   MONOABS 0.3 05/12/2021  0807   EOSABS 0.2 05/12/2021 0807   EOSABS 0.3 05/11/2021 0932   BASOSABS 0.0 05/12/2021 0807   BASOSABS 0.1 05/11/2021 0932   Comprehensive Metabolic Panel:    Component Value Date/Time   NA 132 (L) 05/12/2021 0807   NA 136 05/11/2021 0932   K 3.9 05/12/2021 0807   CL 100 05/12/2021 0807   CO2 24 05/12/2021 0807   BUN 39 (H) 05/12/2021 0807   BUN 34 (H) 05/11/2021 0932   CREATININE 1.83 (H) 05/12/2021 0807   GLUCOSE 138 (H) 05/12/2021 0807   CALCIUM 8.4 (L) 05/12/2021 0807   CALCIUM 8.9 05/12/2019 1121   AST 41 05/12/2021 0807   AST 41 (H) 06/18/2018 1111   ALT 31 05/12/2021 0807   ALT 33 06/18/2018 1111   ALKPHOS 136 (H) 05/12/2021 0807   BILITOT 0.8 05/12/2021 0807   BILITOT 0.6 05/11/2021 0932   PROT 7.1 05/12/2021 0807   PROT 7.0 05/11/2021 0932   ALBUMIN 3.2 (L) 05/12/2021 0807   ALBUMIN 3.7 (L) 05/11/2021 0932    RADIOGRAPHIC STUDIES: US Paracentesis  Result Date: 05/06/2021 INDICATION: ascites, shortness of breath EXAM: ULTRASOUND GUIDED  PARACENTESIS MEDICATIONS: None. COMPLICATIONS: None immediate. PROCEDURE: Informed written consent was obtained from the patient after a discussion of the risks, benefits and alternatives to treatment. A timeout was performed prior to the initiation of the procedure. Initial ultrasound scanning demonstrates a large amount of ascites within the right lower abdominal quadrant. The right lower abdomen was prepped and draped in the usual sterile fashion. 1% lidocaine was used for local anesthesia. Following this, a 19 gauge Yueh catheter was introduced. An ultrasound image was saved for documentation purposes. The paracentesis was performed. The catheter was removed and a dressing was applied. The patient tolerated the procedure well without immediate post procedural complication. FINDINGS: A total of approximately 4 L of clear, straw-colored peritoneal fluid was removed. IMPRESSION: Successful ultrasound-guided paracentesis yielding 4 liters  of clear peritoneal fluid. Electronically Signed   By: Albin Felling M.D.   On: 05/06/2021 17:18   US Paracentesis  Result Date: 04/15/2021 INDICATION: Recurrent ascites secondary to cirrhosis. Request for diagnostic and therapeutic paracentesis. EXAM: ULTRASOUND GUIDED PARACENTESIS MEDICATIONS: 1% lidocaine 10 mL COMPLICATIONS: None immediate. PROCEDURE: Informed written consent was obtained from the patient after a discussion of the risks, benefits and alternatives to treatment. A timeout was performed prior to the initiation of the procedure. Initial ultrasound scanning demonstrates a large amount  of ascites within the right lateral abdomen. The right lateral abdomen was prepped and draped in the usual sterile fashion. 1% lidocaine was used for local anesthesia. Following this, a 6 Fr Safe-T-Centesis catheter was introduced. An ultrasound image was saved for documentation purposes. The paracentesis was performed. The catheter was removed and a dressing was applied. The patient tolerated the procedure well without immediate post procedural complication. FINDINGS: A total of approximately 5 L of clear yellow fluid was removed. Samples were sent to the laboratory as requested by the clinical team. IMPRESSION: Successful ultrasound-guided paracentesis yielding 5 liters of peritoneal fluid. Read by: Gareth Eagle, PA-C Electronically Signed   By: Michaelle Birks M.D.   On: 04/15/2021 12:19   DG Chest Portable 1 View  Result Date: 04/14/2021 CLINICAL DATA:  Short of breath, cirrhosis, abdominal distension EXAM: PORTABLE CHEST 1 VIEW COMPARISON:  09/05/2018 FINDINGS: Single frontal view of the chest demonstrates right chest wall port via internal jugular approach tip overlying superior vena cava. The cardiac silhouette is unremarkable. No airspace disease, effusion, or pneumothorax. IMPRESSION: 1. No acute intrathoracic process. Electronically Signed   By: Randa Ngo M.D.   On: 04/14/2021 23:46   US LIVER  DOPPLER  Result Date: 04/16/2021 CLINICAL DATA:  Ascites abdominal pain.  Rule out venous thrombosis. EXAM: DUPLEX ULTRASOUND OF LIVER TECHNIQUE: Color and duplex Doppler ultrasound was performed to evaluate the hepatic in-flow and out-flow vessels. COMPARISON:  IR ultrasound, 04/15/2021. CT of orbits, 02/01/2021 and 10/12/2020. FINDINGS: Liver: Increased hepatic parenchymal echogenicity, and macronodular contour. No focal lesion, mass or intrahepatic biliary ductal dilatation. Main Portal Vein size: 2.9 cm Portal Vein Velocities Main Prox:  31 cm/sec Main Mid: 40 cm/sec Main Dist:  39 cm/sec Right: 29 cm/sec Left: 31 cm/sec Hepatic Vein Velocities Right:  30 cm/sec Middle:  32 cm/sec Left:  29 cm/sec IVC: Present and patent with normal respiratory phasicity. Hepatic Artery Velocity:  90 cm/sec Splenic Vein Velocity:  39 cm/sec Spleen: 19 x 14 x 7 cm with a total volume of 969 cm^3 (411 cm^3 is upper limit normal) Portal Vein Occlusion/Thrombus: No Splenic Vein Occlusion/Thrombus: No Ascites: Trace residual intra-abdominal ascites. Recent paracentesis the day prior, on 04/15/2021. Varices: None IMPRESSION: 1. No sonographic evidence of portal or hepatic venous obstruction. 2. Cirrhotic morphology of liver with splenomegaly and portal venous velocity at the upper limit of normal, consistent with portal venous hypertension. 3. Trace residual intra-abdominal ascites, status post recent paracentesis. Michaelle Birks, MD [facility] Electronically Signed   By: Michaelle Birks M.D.   On: 04/16/2021 10:12     PERFORMANCE STATUS (ECOG) : 1 - Symptomatic but completely ambulatory  Review of Systems Unless otherwise noted, a complete review of systems is negative.  Physical Exam General: NAD Pulmonary: Unlabored Extremities: no edema, no joint deformities Skin: no rashes Neurological: Grossly nonfocal  IMPRESSION: Patient was an acute add-on to my clinic  schedule today to address goals and pain.    Patient was hospitalized 04/14/2021-04/16/2021 with decompensated cirrhosis and ascites.  Patient has required weekly large-volume paracentesis and is pending repeat paracentesis later today.  He is being followed by GI with diuretics maximized for volume management.  Immunotherapy is being held given his clinical status and Dr. Tasia Catchings plans to repeat CTs.  In the event of significant disease progression, hospice would likely be recommended.  Today, patient endorses persistent and severe back pain.  He recently underwent kyphoplasty for compression fracture.  He has stopped taking  oxycodone for pain as he did not like the way it made him feel.  Patient is not currently taking anything for pain.  Discussed the option of starting him on low-dose transdermal fentanyl, which would be an ideal choice given his cirrhosis.  Patient and Dr. Tasia Catchings were in agreement with this.  We will start patient on transdermal fentanyl 12 mcg every 72 hours.  Patient also has nausea and diarrhea that are worsened with his ascites.  Again, he is pending repeat paracentesis later today.  Discussed option of possible Tenckhoff catheter but will defer to GI who is actively managing.  Discussed liberalizing his antiemetics.  PLAN: -Continue current scope of treatment -Start transdermal fentanyl 12.5 mcg every 72 hours -Continue antiemetic regimen with Zofran/Compazine -Patient pending repeat paracentesis -He will benefit from readdressing ACP -Follow-up MyChart visit 2 weeks or sooner if needed  Case and plan discussed with Dr. Tasia Catchings   Patient expressed understanding and was in agreement with this plan. He also understands that He can call the clinic at any time with any questions, concerns, or complaints.     Time Total: 30 minutes  Visit consisted of counseling and education dealing with the complex and emotionally intense issues of symptom management and palliative care in the  setting of serious and potentially life-threatening illness.Greater than 50%  of this time was spent counseling and coordinating care related to the above assessment and plan.  Signed by: Altha Harm, PhD, NP-C

## 2021-05-12 NOTE — Telephone Encounter (Signed)
Melanie pharmacist at [facility] informed of J Borders, NP response. Benjamine Mola, please complete the PA for this

## 2021-05-12 NOTE — Telephone Encounter (Signed)
PA has been submitted via covermymeds. Response pending   key: [id number] PA Case ID: [id number] rx: [id number]

## 2021-05-12 NOTE — Progress Notes (Signed)
Hematology/Oncology follow-up [facility] [phone] [phone]   Patient Care Team: Albina Billet, MD as PCP - General (Internal Medicine) Earlie Server, MD as Consulting Physician (Hematology and Oncology)  REFERRING PROVIDER: Albina Billet, MD  CHIEF COMPLAINTS/REASON FOR VISIT:  Follow-up for kidney cancer treatments  HISTORY OF PRESENTING ILLNESS:   Henry Moore is a  60 y.o.  male with PMH listed below was seen in consultation at the request of  Albina Billet, MD  for evaluation of kidney cancer Extensive medical records were reviewed Patient had MRI lumbar spine without contrast done on 07/13/2018 which showed mired lumbar degenerative disease. CT thoracic spine was done on 08/05/2018 which showed 5 cm left renal mass. 09/04/2018 CT abdomen and pelvis with and without contrast showed 5 cm round enhancing solid mass in the left kidney.  No involvement of left renal vein.  No lymphadenopathy Morphologic changes consistent with cirrhosis and the portal hypertension.  Small volume ascites and splenomegaly. . 10/16/2018 Left nephrectomy showed renal cell carcinoma, clear-cell type, nuclear grade 4, tumor extends into the renal vein and renal sinus fat.  Urethral, vascular and or resection margins are negative for tumor pT3a Nx Mx  01/28/2019 patient had another CT chest abdomen pelvis done with contrast Showed interval left nephrectomy.  Scattered tiny pulmonary nodules bilaterally.  Nonspecific.  No other evidence of metastatic disease.  Cirrhosis changes extensive coronary and aortic atherosclerosis  07/14/2019 patient underwent surveillance CT chest abdomen pelvis without contrast Interval progression of pulmonary metastasis with new enlarged right paratracheal lymph node 1.7 cm, previously 0.6 cm one-point since worrisome for metastatic adenopathy.  Multiple progressive pulmonary nodules, index nodule within the lingula measures 1.3 cm, previously  3 mm. Index subpleural nodule within the anterior right upper lobe measuring 1.8 cm, previously 3 cm Index nodule within the post lateral right lower lobe measuring 5 mm, new from previous care Aortic sclerosis.  Three-vessel coronary artery calcification noted.  Cirrhosis changes.  Splenomegaly.  Patient was referred to establish care with medical oncology for further discussion and evaluation of metastatic renal cell carcinoma. Patient reports feeling well at baseline today.  Denies any shortness of breath, cough, hemoptysis, back pain, headache. He quitted smoking 2015.  Not currently actively drinking alcohol as well. Patient has chronic back pain unchanged.  #08/14/2019-10/16/2019 nivolumab and ipilimumab. #11/06/2019-nivolumab every 2 weeks maintenance.  # Liver cirrhosis with small amount of ascites/splenomegaly patient sees gastroenterology Dr. [name].  . He will get ultrasound surveillance due in February 2021 for screening of [place].  EGD surveillance for Barrett's plan in April 2021.  # Diabetes, on Humalog sliding scale and metformin.  #Family history of cancer and personal history of RCC.  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  Status post ipilimumab and nivolumab treatment.  # 10/29/2019 CT chest abdomen pelvis without contrast images were independently reviewed by me and discussed with patient.   CT showed marked improvement with resolution of many of the pulmonary nodules, prominent reduced size of other nodules.  Considerably reduced size of the right lower paratracheal lymph node now 0.9 cm in diameter. Hepatic cirrhosis with prominent splenomegaly and trace perisplenic ascites. Chronic CT findings includes aortic atherosclerotic vascular  disease.  Mild sigmoid colon diverticulosis  # CKD, he  establish care with Dr. Candiss Norse for chronic kidney disease.  Blood pressure medication has been adjusted.  Hydrochlorothiazide decreased to 12.5 mg daily.  # NGS -foundation one PD-L1 TPS 0% no reportable mutation, MS stable. TMB 8 muts/mb VHL [id number], SETD2 BAP Genetic testing is negative.   # 07/16/2020, CT chest abdomen pelvis images were reviewed and discussed with patient Compared to July 2021 image, he has had a new patchy groundglass opacities throughout both lungs with central part of solids/airspace components.  Findings are nonspecific and could be secondary to an atypical infection, including viral pneumonia or inflammation.  Patient is asymptomatic.  I wonder the changes are secondary to his Covid infection.  Patient has been started on Levaquin to cover atypical infection by on-call physician and I recommend him to continue and complete the course. Proceed with today's immunotherapy nivolumab.  Advised patient to notify me if his symptoms get worse.  # 10/12/2020 CT chest abdomen pelvis with out contrast. New 15 mm lytic lesion in the right T9 vertebral body.  Suspicious for isolated lytic metastasis.  No findings of suspicious soft tissue metastasis in the chest abdomen or pelvis.  Residual postinfectious inflammatory scarring in the lung bilaterally.  Cirrhosis.  Splenomegaly.  Minimal ascites.  # 10/26/20 Bone scan showed Minimal increased activity noted at the right costovertebral junction at the T9 level is most likely degenerative. Similar finding noted on prior bone scan of 08/11/2019. No definite T9 vertebral body abnormal activity corresponding to lytic lesion noted on recent CT. Again the recently identified T9 vertebral body lytic lesion is suspicious and further evaluation of the thoracic spine with MRI can be obtained. No other bony abnormalities noted by bone scan. I discussed patient's case on tumor board 11/04/2020.  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  Patient has other comorbidities including cirrhosis/pancytopenia, patient opts to proceed with empiric palliative radiation.  12/13/2020 finished RT to T9 June 2022 abscess of anal area , s/ drainage and antibiotics. July 2022, immunotherapy was held due to diarrhea.  02/01/2021  mild wall thickening of aseconding colon. Small infraumbilical ventral hernia,contains short segment of small bowel. Cirrohsis and splenomegaly. Small volume ascites, slight increase 02/03/21 chest wo contrast showed  Lytic lesion within T9 slightly increased in size., no new skeletal metastasis. Mild peripheral ground glass densities.   03/18/2021, resumed on nivolumab treatment x1. 04/01/2021, hold nivolumab due to diarrhea and upcoming surgery.   INTERVAL HISTORY Henry SORLIEis a 60y.o. male who has above history reviewed by me today presents for follow up visit for management of stage IV RCC  # 04/08/2021 paracentesis. Cytology negative for malignancy.  # 04/14/2021[facility]. S/p paracentesis on 04/15/2021 , cytology negative.  # 04/22/21 right T9 transpedicular mass resection. T7-11 posterior spinal fusion by Dr. [facility]# 05/06/2021 abdominal distention. S/p repeat therapeutic paracentesis.  Today he feels poorly. Accompanied by his sister.  Diarrhea is better, + nausea/vomiting, fatigue, weight loss. Some SOB as well.  + back pain. He takes pain medication occasionally. He does not drink much fluids, as he is concerned about reaccumulation of ascites.  He was seen by Dr.Anna this week.  Plan to repeat paracentesis today.   Review of  Systems  Constitutional:   Positive for fatigue. Negative for appetite change, chills, fever and unexpected weight change.  HENT:   Negative for hearing loss and voice change.   Eyes:  Negative for eye problems and icterus.  Respiratory:  Negative for chest tightness, cough and shortness of breath.   Cardiovascular:  Negative for chest pain and leg swelling.  Gastrointestinal:  Positive for nausea and vomiting. Negative for abdominal distention, abdominal pain and diarrhea.  Endocrine: Negative for hot flashes.  Genitourinary:  Negative for difficulty urinating, dysuria and frequency.   Musculoskeletal:  Positive for arthralgias.       Chronic back pain  Skin:  Negative for itching and rash.       Hair loss  Neurological:  Negative for light-headedness and numbness.  Hematological:  Negative for adenopathy. Does not bruise/bleed easily.  Psychiatric/Behavioral:  Negative for confusion.    MEDICAL HISTORY:  Past Medical History:  Diagnosis Date   Cough    SINUS DRAINAGE CAUSING COUGHING , REPORTS CLEAR MUCOUS    Diabetes mellitus without complication (HCC)    Family history of breast cancer    Family history of colon cancer    Family history of stomach cancer    HTN (hypertension)    Hyperlipemia    Left renal mass    Platelets decreased ([place])    DENIES UNSUAL BLEEDING    PONV (postoperative nausea and vomiting)    Renal cell carcinoma ([place]) 08/04/2019   WBC decreased     SURGICAL HISTORY: Past Surgical History:  Procedure Laterality Date   ANKLE SURGERY Left    ANTERIOR CERVICAL DECOMP/DISCECTOMY FUSION N/A 04/16/2014   Procedure: ANTERIOR CERVICAL DECOMPRESSION/DISCECTOMY FUSION  (ACDF C5-C7)   (2 LEVELS)     ;  Surgeon: Melina Schools, MD;  Location: [place];  Service: Orthopedics;  Laterality: N/A;   APPENDECTOMY     BACK SURGERY     ESOPHAGOGASTRODUODENOSCOPY (EGD) WITH PROPOFOL N/A 02/06/2019   Procedure: ESOPHAGOGASTRODUODENOSCOPY (EGD) WITH PROPOFOL;  Surgeon: [name], MD;  Location: [facility]  ENDOSCOPY;  Service: Gastroenterology;  Laterality: N/A;   ESOPHAGOGASTRODUODENOSCOPY (EGD) WITH PROPOFOL N/A 03/04/2019   Procedure: ESOPHAGOGASTRODUODENOSCOPY (EGD) WITH PROPOFOL;  Surgeon: Lin Landsman, MD;  Location: [place];  Service: Gastroenterology;  Laterality: N/A;   ESOPHAGOGASTRODUODENOSCOPY (EGD) WITH PROPOFOL N/A 04/22/2019   Procedure: ESOPHAGOGASTRODUODENOSCOPY (EGD) WITH PROPOFOL;  Surgeon: [name], MD;  Location: [facility] ENDOSCOPY;  Service: Gastroenterology;  Laterality: N/A;   FRACTURE SURGERY     HYDROCELE EXCISION     KNEE ARTHROSCOPY Bilateral    LAPAROSCOPIC NEPHRECTOMY, HAND ASSISTED Left 10/16/2018   Procedure: LEFT HAND ASSISTED LAPAROSCOPIC NEPHRECTOMY;  Surgeon: Lucas Mallow, MD;  Location: [facility];  Service: Urology;  Laterality: Left;   NASAL SINUS SURGERY     X 2   PENILE PROSTHESIS IMPLANT  10 YEARS AGO   PORTA CATH INSERTION N/A 11/11/2019   Procedure: PORTA CATH INSERTION;  Surgeon: Katha Cabal, MD;  Location: [place] CV [facility];  Service: Cardiovascular;  Laterality: N/A;   SHOULDER SURGERY Left     SOCIAL HISTORY: Social History   Socioeconomic History   Marital status: Married    Spouse name: Not on file   Number of children: Not on file   Years of education: Not on file   Highest education level: Not on file  Occupational History   Not on file  Tobacco Use   Smoking status: Former    Packs/day: 0.25    Years:  20.00    Pack years: 5.00    Types: Cigarettes    Quit date: 2015    Years since quitting: 7.7   Smokeless tobacco: Never  Vaping Use   Vaping Use: Never used  Substance and Sexual Activity   Alcohol use: Not Currently    Alcohol/week: 0.0 standard drinks    Comment: no alchol in 1 year   Drug use: No   Sexual activity: Not on file  Other Topics Concern   Not on file  Social History Narrative   Not on file   Social Determinants of Health   Financial Resource Strain: Not on file  Food Insecurity:  Not on file  Transportation Needs: Not on file  Physical Activity: Not on file  Stress: Not on file  Social Connections: Not on file  Intimate Partner Violence: Not on file    FAMILY HISTORY: Family History  Problem Relation Age of Onset   Diabetes Mother    Cancer Mother        neuroendocrine cancer   Cancer Father        neuroendocrine cancer of meninges   Cancer Maternal Grandmother        tomach dx 29   Alzheimer's disease Paternal Grandmother    Lung cancer Paternal Grandfather    Lung cancer Maternal Aunt    Colon cancer Maternal Uncle    Breast cancer Paternal Aunt        both dx 64s   Ovarian cancer Other        dx 16   Breast cancer Cousin     ALLERGIES:  is allergic to codeine and mucinex [guaifenesin er].  MEDICATIONS:  Current Outpatient Medications  Medication Sig Dispense Refill   ACCU-CHEK AVIVA PLUS test strip CHECK BL00D SUGAR ONCE OR TWICE DAILY. 100 each PRN   Cholecalciferol (VITAMIN D3) 125 MCG (5000 UT) CAPS Take 5,000 Units by mouth daily.     furosemide (LASIX) 40 MG tablet Take 1 tablet (40 mg total) by mouth daily. 30 tablet 1   pravastatin (PRAVACHOL) 20 MG tablet Take 1 tablet (20 mg total) by mouth every evening. 30 tablet 11   promethazine (PHENERGAN) 25 MG tablet Take 1 tablet (25 mg total) by mouth every 6 (six) hours as needed for nausea or vomiting. 30 tablet 0   rifaximin (XIFAXAN) 550 MG TABS tablet Take 1 tablet (550 mg total) by mouth 2 (two) times daily. 60 tablet 11   spironolactone (ALDACTONE) 100 MG tablet Take 1 tablet (100 mg total) by mouth daily. 30 tablet 1   vitamin B-12 (CYANOCOBALAMIN) 1000 MCG tablet Take 1 tablet (1,000 mcg total) by mouth daily. 90 tablet 0   calcium carbonate (OS-CAL - DOSED IN MG OF ELEMENTAL CALCIUM) 1250 (500 Ca) MG tablet Take 1 tablet by mouth. (Patient not taking: Reported on 05/12/2021)     carvedilol (COREG) 25 MG tablet Take 1 tablet (25 mg total) by mouth 2 (two) times daily with a meal.  (Patient not taking: Reported on 05/12/2021) 180 tablet 3   fentaNYL (DURAGESIC) 12 MCG/HR Place 1 patch onto the skin every 3 (three) days. 5 patch 0   loperamide (IMODIUM A-D) 2 MG tablet Take 2 mg by mouth 4 (four) times daily as needed for diarrhea or loose stools. (Patient not taking: Reported on 05/12/2021)     prochlorperazine (COMPAZINE) 10 MG tablet Take 1 tablet (10 mg total) by mouth every 6 (six) hours as needed for nausea or vomiting. (Patient not  taking: Reported on 05/12/2021) 30 tablet 0   Current Facility-Administered Medications  Medication Dose Route Frequency Provider Last Rate Last Admin   albumin human 25 % solution 25 g  25 g Intravenous Once [name], MD       Facility-Administered Medications Ordered in Other Visits  Medication Dose Route Frequency Provider Last Rate Last Admin   sodium chloride flush (NS) 0.9 % injection 10 mL  10 mL Intravenous PRN Earlie Server, MD   10 mL at 01/22/20 0831     PHYSICAL EXAMINATION: ECOG PERFORMANCE STATUS: 1 - Symptomatic but completely ambulatory Vitals:   05/12/21 0832  BP: (!) 90/57  Pulse: 75  Temp: (!) 97.5 F (36.4 C)  SpO2: 99%   Filed Weights   05/12/21 0832  Weight: 215 lb 8 oz (97.8 kg)    Physical Exam Constitutional:      General: He is not in acute distress.    Appearance: He is obese.  HENT:     Head: Normocephalic and atraumatic.  Eyes:     General: No scleral icterus. Cardiovascular:     Rate and Rhythm: Normal rate and regular rhythm.     Heart sounds: Normal heart sounds.  Pulmonary:     Effort: Pulmonary effort is normal. No respiratory distress.     Breath sounds: No wheezing.  Abdominal:     General: Bowel sounds are normal. There is no distension.     Palpations: Abdomen is soft.  Musculoskeletal:        General: No deformity. Normal range of motion.     Cervical back: Normal range of motion and neck supple.  Skin:    General: Skin is warm and dry.     Findings: No erythema or rash.   Neurological:     Mental Status: He is alert and oriented to person, place, and time. Mental status is at baseline.     Cranial Nerves: No cranial nerve deficit.     Coordination: Coordination normal.  Psychiatric:        Mood and Affect: Mood normal.    LABORATORY DATA:  I have reviewed the data as listed Lab Results  Component Value Date   WBC 2.8 (L) 05/12/2021   HGB 10.3 (L) 05/12/2021   HCT 31.6 (L) 05/12/2021   MCV 93.5 05/12/2021   PLT 116 (L) 05/12/2021   Recent Labs    04/14/21 1656 04/15/21 1118 04/16/21 0453 05/11/21 0932 05/12/21 0807  NA 132* 135 134* 136 132*  K 5.1 4.5 4.2 4.5 3.9  CL 105 107 108 101 100  CO2 21* 19* 20* 18* 24  GLUCOSE 140* 124* 95 146* 138*  BUN 32* 34* 36* 34* 39*  CREATININE 1.75* 1.48* 1.92* 1.75* 1.83*  CALCIUM 8.5* 8.1* 7.7* 8.7 8.4*  GFRNONAA 45* 55* 40*  --  42*  PROT 6.4*  --   --  7.0 7.1  ALBUMIN 2.9*  --   --  3.7* 3.2*  AST 36  --   --  52* 41  ALT 23  --   --  36 31  ALKPHOS 86  --   --  196* 136*  BILITOT 1.0  --   --  0.6 0.8  BILIDIR 0.2  --   --   --   --   IBILI 0.8  --   --   --   --     Iron/TIBC/Ferritin/ %Sat    Component Value Date/Time   IRON 60 09/02/2020 0350  IRON 105 10/15/2018 1325   TIBC 293 09/02/2020 0822   TIBC 244 (L) 10/15/2018 1325   FERRITIN 164 09/02/2020 0822   FERRITIN 588 (H) 10/15/2018 1325   IRONPCTSAT 21 09/02/2020 0822   IRONPCTSAT 43 10/15/2018 1325      RADIOGRAPHIC STUDIES: I have personally reviewed the radiological images as listed and agreed with the findings in the report. US Abdomen Complete  Result Date: 02/21/2021 CLINICAL DATA:  Renal cell carcinoma of left kidney. Abdominal distention. Cirrhosis EXAM: ABDOMEN ULTRASOUND COMPLETE COMPARISON:  CT 02/01/2021.  Ultrasound 03/11/2019 FINDINGS: Gallbladder: Small mobile gallstones throughout the gallbladder measuring up to 7 mm. Focal gallbladder wall thickening anteriorly measures 9 mm. Negative sonographic Murphy's.  No pericholecystic fluid. Common bile duct: Diameter: Normal caliber, 3 mm Liver: Nodular contours with heterogeneous echotexture compatible with cirrhosis. No focal hepatic abnormality. Portal vein is patent on color Doppler imaging with normal direction of blood flow towards the liver. IVC: No abnormality visualized. Pancreas: Visualized portion unremarkable. Spleen: Splenomegaly with a craniocaudal length of 16 cm and volume 935 mL. Right Kidney: Length: 11.8 cm. Echogenicity within normal limits. No mass or hydronephrosis visualized. Left Kidney: Length: Prior nephrectomy. Abdominal aorta: No aneurysm visualized. Other findings: Small amount of ascites in the right lower quadrant. IMPRESSION: Cirrhosis with associated splenomegaly and small amount of ascites. Cholelithiasis.  No convincing evidence of acute cholecystitis. Electronically Signed   By: Rolm Baptise M.D.   On: 02/21/2021 08:55   US Paracentesis  Result Date: 05/06/2021 INDICATION: ascites, shortness of breath EXAM: ULTRASOUND GUIDED  PARACENTESIS MEDICATIONS: None. COMPLICATIONS: None immediate. PROCEDURE: Informed written consent was obtained from the patient after a discussion of the risks, benefits and alternatives to treatment. A timeout was performed prior to the initiation of the procedure. Initial ultrasound scanning demonstrates a large amount of ascites within the right lower abdominal quadrant. The right lower abdomen was prepped and draped in the usual sterile fashion. 1% lidocaine was used for local anesthesia. Following this, a 19 gauge Yueh catheter was introduced. An ultrasound image was saved for documentation purposes. The paracentesis was performed. The catheter was removed and a dressing was applied. The patient tolerated the procedure well without immediate post procedural complication. FINDINGS: A total of approximately 4 L of clear, straw-colored peritoneal fluid was removed. IMPRESSION: Successful ultrasound-guided  paracentesis yielding 4 liters of clear peritoneal fluid. Electronically Signed   By: Albin Felling M.D.   On: 05/06/2021 17:18   US Paracentesis  Result Date: 04/15/2021 INDICATION: Recurrent ascites secondary to cirrhosis. Request for diagnostic and therapeutic paracentesis. EXAM: ULTRASOUND GUIDED PARACENTESIS MEDICATIONS: 1% lidocaine 10 mL COMPLICATIONS: None immediate. PROCEDURE: Informed written consent was obtained from the patient after a discussion of the risks, benefits and alternatives to treatment. A timeout was performed prior to the initiation of the procedure. Initial ultrasound scanning demonstrates a large amount of ascites within the right lateral abdomen. The right lateral abdomen was prepped and draped in the usual sterile fashion. 1% lidocaine was used for local anesthesia. Following this, a 6 Fr Safe-T-Centesis catheter was introduced. An ultrasound image was saved for documentation purposes. The paracentesis was performed. The catheter was removed and a dressing was applied. The patient tolerated the procedure well without immediate post procedural complication. FINDINGS: A total of approximately 5 L of clear yellow fluid was removed. Samples were sent to the laboratory as requested by the clinical team. IMPRESSION: Successful ultrasound-guided paracentesis yielding 5 liters of peritoneal fluid. Read by: Gareth Eagle, PA-C Electronically Signed  By: Michaelle Birks M.D.   On: 04/15/2021 12:19   US Paracentesis  Result Date: 04/08/2021 INDICATION: Symptomatic ascites. EXAM: ULTRASOUND GUIDED PARACENTESIS MEDICATIONS: None. COMPLICATIONS: None immediate. PROCEDURE: Informed written consent was obtained from the patient after a discussion of the risks, benefits and alternatives to treatment. A timeout was performed prior to the initiation of the procedure. Initial ultrasound scanning demonstrates a large amount of ascites within the right lower abdominal quadrant. The right lower abdomen  was prepped and draped in the usual sterile fashion. 1% lidocaine was used for local anesthesia. Following this, a 8 Fr Safe-T-Centesis catheter was introduced. An ultrasound image was saved for documentation purposes. The paracentesis was performed. The catheter was removed and a dressing was applied. The patient tolerated the procedure well without immediate post procedural complication. Patient did not receive intravenous albumin FINDINGS: A total of approximately 4.0 of serous ascitic fluid was removed. Samples were sent to the laboratory as requested by the clinical team. IMPRESSION: Successful ultrasound-guided paracentesis yielding 4.0 liters of ascites. Michaelle Birks, MD [facility] Electronically Signed   By: Michaelle Birks M.D.   On: 04/08/2021 18:21   DG Chest Portable 1 View  Result Date: 04/14/2021 CLINICAL DATA:  Short of breath, cirrhosis, abdominal distension EXAM: PORTABLE CHEST 1 VIEW COMPARISON:  09/05/2018 FINDINGS: Single frontal view of the chest demonstrates right chest wall port via internal jugular approach tip overlying superior vena cava. The cardiac silhouette is unremarkable. No airspace disease, effusion, or pneumothorax. IMPRESSION: 1. No acute intrathoracic process. Electronically Signed   By: Randa Ngo M.D.   On: 04/14/2021 23:46   MR TOTAL SPINE METS SCREENING  Result Date: 04/12/2021 CLINICAL DATA:  Renal carcinoma. . Kidney CA, hx radiation therapy x 3 months ago; previous c-spine surgery 2015; mid-back pain x 2-3 months; known tumor in spine /// EXAM: MRI TOTAL SPINE WITHOUT AND WITH CONTRAST TECHNIQUE: Multisequence MR imaging of the spine from the cervical spine to the sacrum was performed prior to and following IV contrast administration for evaluation of spinal metastatic disease. CONTRAST:  85m GADAVIST GADOBUTROL 1 MMOL/ML IV SOLN COMPARISON:  None. FINDINGS: Alignment:  Physiologic. Vertebrae: There is a  hyperenhancing lesion within the posterior T9 vertebral body. No pathologic fracture. There is a much smaller lesion within the posterior T8 vertebral body. No other contrast-enhancing lesions of the spine. Cord:  Normal signal and morphology. Disc levels: No spinal canal stenosis. IMPRESSION: 1. Enhancing lesion within the posterior T9 vertebral body, consistent with metastatic disease. No pathologic fracture. 2. Nonspecific focus of contrast enhancement in the posterior T8 vertebral body, which may be a small metastatic lesion. 3. No spinal canal or neural foraminal stenosis. Electronically Signed   By: KUlyses JarredM.D.   On: 04/12/2021 03:40   [place]IVER DOPPLER  Result Date: 04/16/2021 CLINICAL DATA:  Ascites abdominal pain.  Rule out venous thrombosis. EXAM: DUPLEX ULTRASOUND OF LIVER TECHNIQUE: Color and duplex Doppler ultrasound was performed to evaluate the hepatic in-flow and out-flow vessels. COMPARISON:  IR ultrasound, 04/15/2021. CT of orbits, 02/01/2021 and 10/12/2020. FINDINGS: Liver: Increased hepatic parenchymal echogenicity, and macronodular contour. No focal lesion, mass or intrahepatic biliary ductal dilatation. Main Portal Vein size: 2.9 cm Portal Vein Velocities Main Prox:  31 cm/sec Main Mid: 40 cm/sec Main Dist:  39 cm/sec Right: 29 cm/sec Left: 31 cm/sec Hepatic Vein Velocities Right:  30 cm/sec Middle:  32 cm/sec Left:  29 cm/sec IVC: Present and patent with normal respiratory  phasicity. Hepatic Artery Velocity:  90 cm/sec Splenic Vein Velocity:  39 cm/sec Spleen: 19 x 14 x 7 cm with a total volume of 969 cm^3 (411 cm^3 is upper limit normal) Portal Vein Occlusion/Thrombus: No Splenic Vein Occlusion/Thrombus: No Ascites: Trace residual intra-abdominal ascites. Recent paracentesis the day prior, on 04/15/2021. Varices: None IMPRESSION: 1. No sonographic evidence of portal or hepatic venous obstruction. 2. Cirrhotic morphology of liver with splenomegaly and portal venous velocity at the  upper limit of normal, consistent with portal venous hypertension. 3. Trace residual intra-abdominal ascites, status post recent paracentesis. Michaelle Birks, MD [facility] Radiology Electronically Signed   By: Michaelle Birks M.D.   On: 04/16/2021 10:12      ASSESSMENT & PLAN:  1. Renal cell carcinoma of left kidney (HCC)   2. Anemia due to stage 3b chronic kidney disease ([place])   3. Chronic kidney disease, stage 3b ([place])   4. Cirrhosis of liver with ascites, unspecified hepatic cirrhosis type ([place])   5. Thrombocytopenia ([place])   6. Goals of care, counseling/discussion   7. Nausea and vomiting, unspecified vomiting type   8. 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 I will discontinue immunotherapy due to development of diarrhea as toxicity.  Repeat CT chest abdomen pelvis wo for restaging.   # T9 lesion, s/p resection and spine fusion.  Pathology is positive for RCC Hold off Xgeva due to recent procedure.   # uncompensated liver cirrhosis, recurrent ascites, I will defer management to Dr.Anna.   #Goals of care I had a lengthy discussion with patient and his sister.  Restaging CT will provide additional information. Not a candidate for cancer treatment urgently due to uncompensated liver cirrhosis.  He will see palliative care service today for symptoms and also goals of care discussion.   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   #Chronic kidney disease, creatinine 1.83 Encourage oral hydration and avoid nephrotoxin.  #Nausea and vomiting, decreased intake, could be due to abdominal distention.  I offered him IV antiemetics and he declined.  Discussed with patient about antiemetics.  Zofran did not help his symptoms.  Recommend patient to try Compazine 10 mg every 6 hours as needed.  I also gave him a  prescription Phenergan 25 mg as needed.  # Elevated TSH, normal T4 level.  Patient feels very fatigued. 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  Recommend to start low-dose Synthroid supplementation.   Follow-up to be determined.  He will follow-up with palliative care service next week.  All questions were answered. The patient knows to call the clinic with any problems questions or concerns.   Earlie Server, MD, PhD Hematology Oncology [place] [facility]  05/12/2021

## 2021-05-13 ENCOUNTER — Telehealth: Payer: Self-pay

## 2021-05-13 NOTE — Telephone Encounter (Signed)
-----   Message from Earlie Server, MD sent at 05/12/2021  5:11 PM EDT ----- Please let patient know that her thyroid function is slightly low and I recommend patient to start low-dose Synthroid.  This may help his fatigue symptoms.  Prescription was sent to pharmacy.

## 2021-05-13 NOTE — Telephone Encounter (Signed)
Received fax stating that Fentanyl patch PA was approved and as long as pt remains covered by the [facility] he is approved from 05/12/21 - 05/12/22. Will notify pharmacy.

## 2021-05-13 NOTE — Telephone Encounter (Signed)
Pt informed and voiced understanding. Also gave pt appt details for CT.

## 2021-05-17 ENCOUNTER — Encounter: Payer: Self-pay | Admitting: Oncology

## 2021-05-20 ENCOUNTER — Ambulatory Visit
Admission: RE | Admit: 2021-05-20 | Discharge: 2021-05-20 | Disposition: A | Discharge status: Home / Self Care | Payer: BC Managed Care – PPO | Source: Ambulatory Visit | Attending: Gastroenterology | Admitting: Gastroenterology

## 2021-05-20 ENCOUNTER — Other Ambulatory Visit: Payer: Self-pay | Admitting: Gastroenterology

## 2021-05-20 ENCOUNTER — Other Ambulatory Visit: Payer: Self-pay

## 2021-05-20 DIAGNOSIS — K7460 Unspecified cirrhosis of liver: Secondary | ICD-10-CM | POA: Diagnosis not present

## 2021-05-20 DIAGNOSIS — K7682 Hepatic encephalopathy: Secondary | ICD-10-CM

## 2021-05-20 DIAGNOSIS — R188 Other ascites: Secondary | ICD-10-CM

## 2021-05-20 DIAGNOSIS — M6284 Sarcopenia: Secondary | ICD-10-CM

## 2021-05-25 ENCOUNTER — Encounter: Payer: Self-pay | Admitting: Oncology

## 2021-05-26 ENCOUNTER — Inpatient Hospital Stay (HOSPITAL_BASED_OUTPATIENT_CLINIC_OR_DEPARTMENT_OTHER): Payer: BC Managed Care – PPO | Admitting: Hospice and Palliative Medicine

## 2021-05-26 DIAGNOSIS — C642 Malignant neoplasm of left kidney, except renal pelvis: Secondary | ICD-10-CM

## 2021-05-26 DIAGNOSIS — Z515 Encounter for palliative care: Secondary | ICD-10-CM

## 2021-05-26 DIAGNOSIS — G893 Neoplasm related pain (acute) (chronic): Secondary | ICD-10-CM | POA: Diagnosis not present

## 2021-05-26 NOTE — Progress Notes (Signed)
Virtual Visit via telephone note  I connected with Henry Moore on 05/26/21 at  1:30 PM EDT by telephone and verified that I am speaking with the correct person using two identifiers.   I discussed the limitations, risks, security and privacy concerns of performing an evaluation and management service by telephone and the availability of in person appointments. I also discussed with the patient that there may be a patient responsible charge related to this service. The patient expressed understanding and agreed to proceed.   History of Present Illness: Henry Moore is a 60 y.o. 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  patient was referred to palliative care to help address goals and manage ongoing symptoms.   Observations/Objective: I called and spoke with patient by phone.   Since last seen, patient reports that he is doing much better.  He denies any significant or concerning symptoms at present.  He says pain is now stable and he is only intermittently requiring the transdermal fentanyl.  He did not request a refill of that today.  Appetite is stable.  Patient has not required any recent paracentesis.  He is pending repeat CT with further plan by medical oncology.  Assessment and Plan: RCC -pending on 10/31.  CT followed by Dr. Tasia Catchings.    Neoplasm related pain -improved.  Restart transdermal fentanyl as needed  Follow Up Instructions: Follow-up MyChart visit 1 month   I discussed the assessment and treatment plan with the patient. The patient was provided an opportunity to ask questions and all were answered. The patient agreed with the plan and demonstrated an understanding of the instructions.   The patient was advised to call back or seek an in-person evaluation if the symptoms worsen or if the condition fails to improve as anticipated.  I provided 5 minutes of  non-face-to-face time during this encounter.   Irean Hong, NP

## 2021-05-30 ENCOUNTER — Other Ambulatory Visit: Payer: Self-pay

## 2021-05-30 ENCOUNTER — Ambulatory Visit
Admission: RE | Admit: 2021-05-30 | Discharge: 2021-05-30 | Disposition: A | Discharge status: Home / Self Care | Payer: BC Managed Care – PPO | Source: Ambulatory Visit | Attending: Oncology | Admitting: Oncology

## 2021-05-30 DIAGNOSIS — C642 Malignant neoplasm of left kidney, except renal pelvis: Secondary | ICD-10-CM | POA: Diagnosis present

## 2021-06-01 ENCOUNTER — Telehealth: Payer: Self-pay | Admitting: Gastroenterology

## 2021-06-01 ENCOUNTER — Other Ambulatory Visit: Payer: Self-pay

## 2021-06-01 ENCOUNTER — Encounter: Payer: Self-pay | Admitting: Gastroenterology

## 2021-06-01 ENCOUNTER — Ambulatory Visit (INDEPENDENT_AMBULATORY_CARE_PROVIDER_SITE_OTHER): Payer: BC Managed Care – PPO | Admitting: Gastroenterology

## 2021-06-01 VITALS — BP 101/68 | HR 77 | Temp 98.7°F | Ht 73.0 in | Wt 209.0 lb

## 2021-06-01 DIAGNOSIS — K7460 Unspecified cirrhosis of liver: Secondary | ICD-10-CM | POA: Diagnosis not present

## 2021-06-01 DIAGNOSIS — I8500 Esophageal varices without bleeding: Secondary | ICD-10-CM

## 2021-06-01 DIAGNOSIS — Z23 Encounter for immunization: Secondary | ICD-10-CM | POA: Diagnosis not present

## 2021-06-01 DIAGNOSIS — R188 Other ascites: Secondary | ICD-10-CM | POA: Diagnosis not present

## 2021-06-01 NOTE — Telephone Encounter (Signed)
Pt. Calling to reschedule procedure he says he would like to be scheduled for the following week

## 2021-06-01 NOTE — Progress Notes (Signed)
[name] MD, MRCP(U.K) [address]  [place]  [place], [place] [address]  Main: [phone]  Fax: [phone]   Primary Care Physician: Albina Billet, MD  Primary Gastroenterologist:  Dr. [name]   Chief Complaint  Patient presents with   Ascites    HPI: Henry Moore is a 60 y.o. male  Summary of history : He initially diagnosed with cirrhosis of the liver in March 2020 and was seen by [place] .  Likely secondary to excess alcohol consumption that he had stopped in 2019.  History of nonbleeding esophageal varices that were banded in 2020 along with portal hypertensive gastropathy.  Has metastatic RCC   10/15/2018: Labs are negative for autoimmune hepatitis and viral hepatitis.  Not immune to hepatitis A and B.  04/22/2019: EGD: Grade 1 esophageal varices that were not requiring any banding.  Portal hypertensive gastropathy. 03/11/2019 right upper quadrant ultrasound: Cirrhotic liver but no masses seen  04/17/2019: [facility] hepatology visit.  Was referred as he had an elevated meld score. Subsequently did not follow-up with [place] for over 2 years . Came to the hospital on 04/14/2021 with ascites.  Underwent paracentesis, no SBP.  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  He has been having diarrhea negative for infection likely secondary to immunotherapy.  Did not tolerate steroids well 02/21/2021 ultrasound abdomen shows cirrhosis with splenomegaly and small amount of ascites 04/16/2021 Doppler ultrasound shows no vascular thrombosis   Interval history 05/11/2021-06/01/2021  Last visit there was concern for fluid buildup as he is gaining weight.  Meld score of 18 on 05/12/2021 05/20/2021 ultrasound ascites survey minimal ascites 05/30/2021: CT chest abdomen pelvis without contrast shows pulmonary nodule concerning for metastatic disease final met cirrhosis with splenomegaly small volume ascites [id number]: Seen by palliative care  He  says he is doing well.  No new complaints.  Needs refills for Aldactone and Lasix.  No NSAID use.  Consuming very minimal salt.  No confusion.  Taking his lactulose and Xifaxan.    Current Outpatient Medications  Medication Sig Dispense Refill   ACCU-CHEK AVIVA PLUS test strip CHECK BL00D SUGAR ONCE OR TWICE DAILY. 100 each PRN   calcium carbonate (OS-CAL - DOSED IN MG OF ELEMENTAL CALCIUM) 1250 (500 Ca) MG tablet Take 1 tablet by mouth.     carvedilol (COREG) 25 MG tablet Take 1 tablet (25 mg total) by mouth 2 (two) times daily with a meal. 180 tablet 3   Cholecalciferol (VITAMIN D3) 125 MCG (5000 UT) CAPS Take 5,000 Units by mouth daily.     fentaNYL (DURAGESIC) 12 MCG/HR Place 1 patch onto the skin every 3 (three) days. 5 patch 0   furosemide (LASIX) 40 MG tablet Take 1 tablet (40 mg total) by mouth daily. 30 tablet 1   levothyroxine (SYNTHROID) 25 MCG tablet Take 1 tablet (25 mcg total) by mouth daily before breakfast. 30 tablet 0   loperamide (IMODIUM A-D) 2 MG tablet Take 2 mg by mouth 4 (four) times daily as needed for diarrhea or loose stools.     pravastatin (PRAVACHOL) 20 MG tablet Take 1 tablet (20 mg total) by mouth every evening. 30 tablet 11   prochlorperazine (COMPAZINE) 10 MG tablet Take 1 tablet (10 mg total) by mouth every 6 (six) hours as needed for nausea or vomiting. 30 tablet 0   promethazine (PHENERGAN) 25 MG tablet Take 1 tablet (25 mg total) by mouth every 6 (six) hours as  needed for nausea or vomiting. 30 tablet 0   rifaximin (XIFAXAN) 550 MG TABS tablet Take 1 tablet (550 mg total) by mouth 2 (two) times daily. 60 tablet 11   spironolactone (ALDACTONE) 100 MG tablet Take 1 tablet (100 mg total) by mouth daily. 30 tablet 1   vitamin B-12 (CYANOCOBALAMIN) 1000 MCG tablet Take 1 tablet (1,000 mcg total) by mouth daily. 90 tablet 0   Current Facility-Administered Medications  Medication Dose Route Frequency Provider Last Rate Last Admin   albumin human 25 % solution 25 g   25 g Intravenous Once [name], MD       Facility-Administered Medications Ordered in Other Visits  Medication Dose Route Frequency Provider Last Rate Last Admin   sodium chloride flush (NS) 0.9 % injection 10 mL  10 mL Intravenous PRN Earlie Server, MD   10 mL at 01/22/20 0831    Allergies as of 06/01/2021 - Review Complete 06/01/2021  Allergen Reaction Noted   Codeine Itching 04/14/2014   Mucinex [guaifenesin er] Anxiety 05/21/2017    ROS:  General: Negative for anorexia, weight loss, fever, chills, fatigue, weakness. ENT: Negative for hoarseness, difficulty swallowing , nasal congestion. CV: Negative for chest pain, angina, palpitations, dyspnea on exertion, peripheral edema.  Respiratory: Negative for dyspnea at rest, dyspnea on exertion, cough, sputum, wheezing.  GI: See history of present illness. GU:  Negative for dysuria, hematuria, urinary incontinence, urinary frequency, nocturnal urination.  Endo: Negative for unusual weight change.    Physical Examination:   BP 101/68   Pulse 77   Temp 98.7 F (37.1 C) (Oral)   Ht 6' 1"  (1.854 m)   Wt 209 lb (94.8 kg)   BMI 27.57 kg/m   General: Well-nourished, well-developed in no acute distress.  Eyes: No icterus. Conjunctivae pink. Mouth: Oropharyngeal mucosa moist and pink , no lesions erythema or exudate. Abdomen: Bowel sounds are normal, nontender, nondistended, no hepatosplenomegaly or masses, no abdominal bruits or hernia , no rebound or guarding.   Extremities: No lower extremity edema. No clubbing or deformities. Neuro: Alert and oriented x 3.  Grossly intact. Skin: Warm and dry, no jaundice.   Psych: Alert and cooperative, normal mood and affect.   Imaging Studies: US Paracentesis  Result Date: 05/06/2021 INDICATION: ascites, shortness of breath EXAM: ULTRASOUND GUIDED  PARACENTESIS MEDICATIONS: None. COMPLICATIONS: None immediate. PROCEDURE: Informed written consent was obtained from the patient after a discussion  of the risks, benefits and alternatives to treatment. A timeout was performed prior to the initiation of the procedure. Initial ultrasound scanning demonstrates a large amount of ascites within the right lower abdominal quadrant. The right lower abdomen was prepped and draped in the usual sterile fashion. 1% lidocaine was used for local anesthesia. Following this, a 19 gauge Yueh catheter was introduced. An ultrasound image was saved for documentation purposes. The paracentesis was performed. The catheter was removed and a dressing was applied. The patient tolerated the procedure well without immediate post procedural complication. FINDINGS: A total of approximately 4 L of clear, straw-colored peritoneal fluid was removed. IMPRESSION: Successful ultrasound-guided paracentesis yielding 4 liters of clear peritoneal fluid. Electronically Signed   By: Albin Felling M.D.   On: 05/06/2021 17:18   [place] ASCITES (ABDOMEN LIMITED)  Result Date: 05/20/2021 CLINICAL DATA:  Evaluate ascites for paracentesis. EXAM: LIMITED ABDOMEN ULTRASOUND FOR ASCITES TECHNIQUE: Limited ultrasound survey for ascites was performed in all four abdominal quadrants. COMPARISON:  05/12/2021 FINDINGS: Minimal ascites in left upper quadrant. Minimal ascites in the right  lower quadrant. No ascites in the right upper quadrant. Fluid reservoir in the left lower quadrant related to the penile implants. IMPRESSION: Minimal ascites.  Paracentesis not performed. Electronically Signed   By: Markus Daft M.D.   On: 05/20/2021 16:26   [place] ASCITES (ABDOMEN LIMITED)  Result Date: 05/12/2021 CLINICAL DATA:  Evaluate for ascites and potential paracentesis. EXAM: LIMITED ABDOMEN ULTRASOUND FOR ASCITES TECHNIQUE: Limited ultrasound survey for ascites was performed in all four abdominal quadrants. COMPARISON:  05/06/2021 FINDINGS: Minimal fluid in the right lower quadrant. No significant fluid on left side of the abdomen. IMPRESSION: Minimal ascites in the right  lower quadrant. Paracentesis not performed due to the small amount of fluid. Electronically Signed   By: Markus Daft M.D.   On: 05/12/2021 15:07   CT CHEST ABDOMEN PELVIS WO CONTRAST  Result Date: 05/31/2021 CLINICAL DATA:  Renal cell carcinoma.  Restaging. EXAM: CT CHEST, ABDOMEN AND PELVIS WITHOUT CONTRAST TECHNIQUE: Multidetector CT imaging of the chest, abdomen and pelvis was performed following the standard protocol without IV contrast. COMPARISON:  Chest CT 02/03/2021.  Abdomen pelvis CT 02/01/2021 FINDINGS: CT CHEST FINDINGS Cardiovascular: The heart size is normal. No substantial pericardial effusion. Coronary artery calcification is evident. Mild atherosclerotic calcification is noted in the wall of the thoracic aorta. Right Port-A-Cath tip is positioned in the mid right atrium. Mediastinum/Nodes: No mediastinal lymphadenopathy. No evidence for gross hilar lymphadenopathy although assessment is limited by the lack of intravenous contrast on the current study. The esophagus has normal imaging features. There is no axillary lymphadenopathy. Lungs/Pleura: Scattered areas of peripheral architectural distortion are similar to prior. 8 mm left upper lobe nodule visible on image 81/series 3 was 2 mm previously. No focal airspace consolidation. There is no evidence of pleural effusion. Musculoskeletal: Interval thoracic fusion above and below the T9 metastatic lesion. CT ABDOMEN PELVIS FINDINGS Hepatobiliary: Nodular liver contour is compatible with cirrhosis. Small subtle noncalcified gallstones evident. No intrahepatic or extrahepatic biliary dilation. Pancreas: No focal mass lesion. No dilatation of the main duct. No intraparenchymal cyst. No peripancreatic edema. Spleen: Spleen is enlarged. Adrenals/Urinary Tract: No adrenal nodule or mass. Left kidney surgically absent. Right kidney and ureter unremarkable on noncontrast imaging. Bladder is nondistended. Stomach/Bowel: Stomach is unremarkable. No gastric  wall thickening. No evidence of outlet obstruction. Duodenum is normally positioned as is the ligament of Treitz. No small bowel wall thickening. No small bowel dilatation. The terminal ileum is normal. The appendix is not well visualized, but there is no edema or inflammation in the region of the cecum. No gross colonic mass. No colonic wall thickening. Vascular/Lymphatic: There is advanced atherosclerotic calcification of the abdominal aorta without aneurysm. There is no gastrohepatic or hepatoduodenal ligament lymphadenopathy. No retroperitoneal or mesenteric lymphadenopathy. Haziness in the retroperitoneal space is unchanged. No pelvic sidewall lymphadenopathy. Reproductive: Stable.  Penile prosthesis again noted. Other: Small volume ascites noted in the abdomen and pelvis, mildly increased in the interval. Musculoskeletal: Previously described tiny lucency in the L3 vertebral body is stable. IMPRESSION: 1. 8 mm left upper lobe pulmonary nodule was 2 mm previously. Interval progression highly concerning for metastatic disease although primary bronchogenic neoplasm is a consideration. 2. Interval thoracic fusion above and below the T9 metastatic lesion. 3. Previously described tiny lucency inferior L3 vertebral body is stable. 4. Cirrhosis with splenomegaly and small volume ascites. 5. Cholelithiasis. 6. Aortic Atherosclerosis (ICD10-I70.0). Electronically Signed   By: Misty Stanley M.D.   On: 05/31/2021 10:01    Assessment and Plan:  ARAN MENNING is a 60 y.o. y/o male here to follow-up for cirrhosis of the liver incidentally diagnosed in March 2020.  Has had esophageal varices without bleeding that have been banded and eradicated.  Weight has been stable.  Likely NAFLD/alcoholic fatty liver disease.  Metastatic RCC meld score of 18 on 05/21/2021.  Followed with palliative care.  Immune to hepatitis A but not to hepatitis B.  No evidence of hepatic encephalopathy.  Plan 1.  Needs hepatitis B vaccine 2.  Low salt diet.  Recheck labs t today since you are started on diuretics 3.  Stay off all alcohol and NSAIDs 4.  [facility] liver to screen for [facility] April 2023 5.  EGD to screen for varices 6.  Commenced on Xifaxan for hepatic encephalopathy 7.  Continue diuretics with Aldactone 100 mg/day and furosemide 40 mg once daily: medically has been copntrolled  8.  He is not a candidate for liver transplant due to metastatic RCC 9.  Sarcopenia continue consuming high-protein shake and resume once to twice a day to get about 60 g of protein at least through this  I have discussed alternative options, risks & benefits,  which include, but are not limited to, bleeding, infection, perforation,respiratory complication & drug reaction.  The patient agrees with this plan & written consent will be obtained.    Dr [name]  MD,MRCP [facility]) Follow up in 3 months

## 2021-06-02 NOTE — Telephone Encounter (Signed)
Called patient back and he stated that he would like to have his procedure done on 06/20/2021. I told him that it would be changed. He had no further it on My Chart soon.

## 2021-06-09 ENCOUNTER — Institutional Professional Consult (permissible substitution): Payer: BC Managed Care – PPO | Admitting: Radiation Oncology

## 2021-06-14 ENCOUNTER — Inpatient Hospital Stay: Payer: Medicare Other | Attending: Hospice and Palliative Medicine | Admitting: Oncology

## 2021-06-14 ENCOUNTER — Encounter: Payer: Self-pay | Admitting: Oncology

## 2021-06-14 ENCOUNTER — Other Ambulatory Visit: Payer: Self-pay

## 2021-06-14 VITALS — BP 129/83 | HR 83 | Temp 96.4°F | Wt 208.0 lb

## 2021-06-14 DIAGNOSIS — K7460 Unspecified cirrhosis of liver: Secondary | ICD-10-CM

## 2021-06-14 DIAGNOSIS — C7951 Secondary malignant neoplasm of bone: Secondary | ICD-10-CM | POA: Insufficient documentation

## 2021-06-14 DIAGNOSIS — Z79899 Other long term (current) drug therapy: Secondary | ICD-10-CM | POA: Diagnosis not present

## 2021-06-14 DIAGNOSIS — R188 Other ascites: Secondary | ICD-10-CM

## 2021-06-14 DIAGNOSIS — Z923 Personal history of irradiation: Secondary | ICD-10-CM | POA: Diagnosis not present

## 2021-06-14 DIAGNOSIS — C642 Malignant neoplasm of left kidney, except renal pelvis: Secondary | ICD-10-CM | POA: Diagnosis present

## 2021-06-14 DIAGNOSIS — C7800 Secondary malignant neoplasm of unspecified lung: Secondary | ICD-10-CM | POA: Diagnosis not present

## 2021-06-14 DIAGNOSIS — N1832 Chronic kidney disease, stage 3b: Secondary | ICD-10-CM

## 2021-06-14 DIAGNOSIS — R7989 Other specified abnormal findings of blood chemistry: Secondary | ICD-10-CM | POA: Insufficient documentation

## 2021-06-14 DIAGNOSIS — R197 Diarrhea, unspecified: Secondary | ICD-10-CM | POA: Diagnosis not present

## 2021-06-14 DIAGNOSIS — D631 Anemia in chronic kidney disease: Secondary | ICD-10-CM

## 2021-06-14 DIAGNOSIS — D696 Thrombocytopenia, unspecified: Secondary | ICD-10-CM

## 2021-06-14 MED ORDER — LEVOTHYROXINE SODIUM 25 MCG PO TABS: 25.0000 ug | ORAL_TABLET | Freq: Every day | ORAL | 3 refills | Status: DC

## 2021-06-14 NOTE — Progress Notes (Signed)
Hematology/Oncology follow-up [phone] [phone]   Patient Care Team: Albina Billet, MD as PCP - General (Internal Medicine) Earlie Server, MD as Consulting Physician (Hematology and Oncology)  REFERRING PROVIDER: Albina Billet, MD  CHIEF COMPLAINTS/REASON FOR VISIT:  Follow-up for kidney cancer treatments  HISTORY OF PRESENTING ILLNESS:   Henry Moore is a  60 y.o.  male with PMH listed below was seen in consultation at the request of  Albina Billet, MD  for evaluation of kidney cancer Extensive medical records were reviewed Patient had MRI lumbar spine without contrast done on 07/13/2018 which showed mired lumbar degenerative disease. CT thoracic spine was done on 08/05/2018 which showed 5 cm left renal mass. 09/04/2018 CT abdomen and pelvis with and without contrast showed 5 cm round enhancing solid mass in the left kidney.  No involvement of left renal vein.  No lymphadenopathy Morphologic changes consistent with cirrhosis and the portal hypertension.  Small volume ascites and splenomegaly. . 10/16/2018 Left nephrectomy showed renal cell carcinoma, clear-cell type, nuclear grade 4, tumor extends into the renal vein and renal sinus fat.  Urethral, vascular and or resection margins are negative for tumor pT3a Nx Mx  01/28/2019 patient had another CT chest abdomen pelvis done with contrast Showed interval left nephrectomy.  Scattered tiny pulmonary nodules bilaterally.  Nonspecific.  No other evidence of metastatic disease.  Cirrhosis changes extensive coronary and aortic atherosclerosis  07/14/2019 patient underwent surveillance CT chest abdomen pelvis without contrast Interval progression of pulmonary metastasis with new enlarged right paratracheal lymph node 1.7 cm, previously 0.6 cm one-point since worrisome for metastatic adenopathy.  Multiple progressive pulmonary nodules, index nodule within the lingula measures 1.3 cm, previously 3 mm. Index subpleural nodule  within the anterior right upper lobe measuring 1.8 cm, previously 3 cm Index nodule within the post lateral right lower lobe measuring 5 mm, new from previous care Aortic sclerosis.  Three-vessel coronary artery calcification noted.  Cirrhosis changes.  Splenomegaly.  Patient was referred to establish care with medical oncology for further discussion and evaluation of metastatic renal cell carcinoma. Patient reports feeling well at baseline today.  Denies any shortness of breath, cough, hemoptysis, back pain, headache. He quitted smoking 2015.  Not currently actively drinking alcohol as well. Patient has chronic back pain unchanged.  #08/14/2019-10/16/2019 nivolumab and ipilimumab. #11/06/2019-nivolumab every 2 weeks maintenance.  # Liver cirrhosis with small amount of ascites/splenomegaly patient sees gastroenterology Dr. [name].  . He will get ultrasound surveillance due in February 2021 for screening of [place].  EGD surveillance for Barrett's plan in April 2021.  # Diabetes, on Humalog sliding scale and metformin.  #Family history of cancer and personal history of RCC.  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  Status post ipilimumab and nivolumab treatment.  # 10/29/2019 CT chest abdomen pelvis without contrast images were independently reviewed by me and discussed with patient.   CT showed marked improvement with resolution of many of the pulmonary nodules, prominent reduced size of other nodules.  Considerably reduced size of the right lower paratracheal lymph node now 0.9 cm in diameter. Hepatic cirrhosis with prominent splenomegaly and trace perisplenic ascites. Chronic CT findings includes aortic atherosclerotic vascular disease.  Mild sigmoid colon  diverticulosis  # CKD, he establish care with Dr.  Singh for chronic kidney disease.  Blood pressure medication has been adjusted.  Hydrochlorothiazide decreased to 12.5 mg daily.  # NGS -foundation one PD-L1 TPS 0% no reportable mutation, MS stable. TMB 8 muts/mb VHL [id number], SETD2 BAP Genetic testing is negative.   # 07/16/2020, CT chest abdomen pelvis images were reviewed and discussed with patient Compared to July 2021 image, he has had a new patchy groundglass opacities throughout both lungs with central part of solids/airspace components.  Findings are nonspecific and could be secondary to an atypical infection, including viral pneumonia or inflammation.  Patient is asymptomatic.  I wonder the changes are secondary to his Covid infection.  Patient has been started on Levaquin to cover atypical infection by on-call physician and I recommend him to continue and complete the course. Proceed with today's immunotherapy nivolumab.  Advised patient to notify me if his symptoms get worse.  # 10/12/2020 CT chest abdomen pelvis with out contrast. New 15 mm lytic lesion in the right T9 vertebral body.  Suspicious for isolated lytic metastasis.  No findings of suspicious soft tissue metastasis in the chest abdomen or pelvis.  Residual postinfectious inflammatory scarring in the lung bilaterally.  Cirrhosis.  Splenomegaly.  Minimal ascites.  # 10/26/20 Bone scan showed Minimal increased activity noted at the right costovertebral junction at the T9 level is most likely degenerative. Similar finding noted on prior bone scan of 08/11/2019. No definite T9 vertebral body abnormal activity corresponding to lytic lesion noted on recent CT. Again the recently identified T9 vertebral body lytic lesion is suspicious and further evaluation of the thoracic spine with MRI can be obtained. No other bony abnormalities noted by bone scan. I discussed patient's case on tumor board 11/04/2020.  Bone scan probably is not  sensitive in detecting lytic lesion from [place]  Consensus reached upon clinically based on the CT scan, new bone lesion is highly likely metastasis from [facility]  Options including proceed with bone lesion biopsy versus referral to radiation oncology for empiric palliative radiation.  Patient has other comorbidities including cirrhosis/pancytopenia, patient opts to proceed with empiric palliative radiation.  12/13/2020 finished RT to T9 June 2022 abscess of anal area , s/ drainage and antibiotics. July 2022, immunotherapy was held due to diarrhea.  02/01/2021  mild wall thickening of aseconding colon. Small infraumbilical ventral hernia,contains short segment of small bowel. Cirrohsis and splenomegaly. Small volume ascites, slight increase 02/03/21 chest wo contrast showed  Lytic lesion within T9 slightly increased in size., no new skeletal metastasis. Mild peripheral ground glass densities.   03/18/2021, resumed on nivolumab treatment x1. 04/01/2021, hold nivolumab due to diarrhea and upcoming surgery. # 04/08/2021 paracentesis. Cytology negative for malignancy.  # [facility]14/2021[facility] S/p paracentesis on 04/15/2021 , cytology negative.  # 04/22/21 right T9 transpedicular mass resection. T7-11 [facility] 05/06/2021 abdominal distention. S/p repeat therapeutic paracentesis.    INTERVAL HISTORY Henry RYSERis a 60y.o. male who has above history reviewed by me today presents for follow up visit for management of stage IV RCC Patient presents to review CT scan, and management plan.  Patient has been feeling much better since last visit.  No recurrence of ascites.  He is now on Lasix, spironolactone, rifaximin and he follows up with gastroenterology Dr. ARoselie Awkwardfor liver cirrhosis.    Review of Systems  Constitutional:  Positive for fatigue. Negative for appetite change, chills, fever and unexpected weight change.  HENT:  Negative for hearing loss and voice change.   Eyes:  Negative for eye problems and icterus.  Respiratory:  Negative for chest tightness, cough and shortness of breath.   Cardiovascular:  Negative for chest pain and leg swelling.  Gastrointestinal:  Positive for nausea and vomiting. Negative for abdominal distention, abdominal pain and diarrhea.  Endocrine: Negative for hot flashes.  Genitourinary:  Negative for difficulty urinating, dysuria and frequency.   Musculoskeletal:  Positive for arthralgias.       Chronic back pain  Skin:  Negative for itching and rash.       Hair loss  Neurological:  Negative for light-headedness and numbness.  Hematological:  Negative for adenopathy. Does not bruise/bleed easily.  Psychiatric/Behavioral:  Negative for confusion.    MEDICAL HISTORY:  Past Medical History:  Diagnosis Date   Cough    SINUS DRAINAGE CAUSING COUGHING , REPORTS CLEAR MUCOUS    Diabetes mellitus without complication (HCC)    Family history of breast cancer    Family history of colon cancer    Family history of stomach cancer    HTN (hypertension)    Hyperlipemia    Left renal mass    Platelets decreased ([place])    DENIES UNSUAL BLEEDING    PONV (postoperative nausea and vomiting)    Renal cell carcinoma ([place]) 08/04/2019   WBC decreased     SURGICAL HISTORY: Past Surgical History:  Procedure Laterality Date   ANKLE SURGERY Left    ANTERIOR CERVICAL DECOMP/DISCECTOMY FUSION N/A 04/16/2014   Procedure: ANTERIOR CERVICAL DECOMPRESSION/DISCECTOMY FUSION  (ACDF C5-C7)   (2 LEVELS)     ;  Surgeon: Melina Schools, MD;  Location: [facility];  Service: Orthopedics;  Laterality: N/A;   APPENDECTOMY     BACK SURGERY     ESOPHAGOGASTRODUODENOSCOPY (EGD) WITH PROPOFOL N/A 02/06/2019   Procedure: ESOPHAGOGASTRODUODENOSCOPY (EGD) WITH PROPOFOL;  Surgeon: [name], MD;  [facility];  Service: Gastroenterology;  Laterality: N/A;   ESOPHAGOGASTRODUODENOSCOPY (EGD) WITH PROPOFOL N/A  03/04/2019   Procedure: ESOPHAGOGASTRODUODENOSCOPY (EGD) WITH PROPOFOL;  Surgeon: Lin Landsman, MD;  Location: [facility];  Service: Gastroenterology;  Laterality: N/A;   ESOPHAGOGASTRODUODENOSCOPY (EGD) WITH PROPOFOL N/A 04/22/2019   Procedure: ESOPHAGOGASTRODUODENOSCOPY (EGD) WITH PROPOFOL;  Surgeon: [name], MD;  Location[facility] ENDOSCOPY;  Service: Gastroenterology;  Laterality: N/A;   FRACTURE SURGERY     HYDROCELE EXCISION     KNEE ARTHROSCOPY Bilateral    LAPAROSCOPIC NEPHRECTOMY, HAND ASSISTED Left 10/16/2018   Procedure: LEFT HAND ASSISTED LAPAROSCOPIC NEPHRECTOMY;  Surgeon: Lucas Mallow, MD;  Location: [facility];  Service: Urology;  Laterality: Left;   NASAL SINUS SURGERY     X 2   PENILE PROSTHESIS IMPLANT  10 YEARS AGO   PORTA CATH INSERTION N/A 11/11/2019   Procedure: PORTA CATH INSERTION;  Surgeon: Katha Cabal, MD;  Location: [place] CV LAB;  Service: Cardiovascular;  Laterality: N/A;   SHOULDER SURGERY Left     SOCIAL HISTORY: Social History   Socioeconomic History   Marital status: Married    Spouse name: Not on file   Number of children: Not on file   Years of education: Not on file   Highest education level: Not on file  Occupational History   Not on file  Tobacco Use   Smoking status: Former    Packs/day: 0.25    Years: 20.00    Pack years: 5.00    Types: Cigarettes    Quit date: 2015    Years  since quitting: 7.8   Smokeless tobacco: Never  Vaping Use   Vaping Use: Never used  Substance and Sexual Activity   Alcohol use: Not Currently    Alcohol/week: 0.0 standard drinks    Comment: no alchol in 1 year   Drug use: No   Sexual activity: Not on file  Other Topics Concern   Not on file  Social History Narrative   Not on file   Social Determinants of Health   Financial Resource Strain: Not on file  Food Insecurity: Not on file  Transportation Needs: Not on file  Physical Activity: Not on file  Stress: Not on file  Social  Connections: Not on file  Intimate Partner Violence: Not on file    FAMILY HISTORY: Family History  Problem Relation Age of Onset   Diabetes Mother    Cancer Mother        neuroendocrine cancer   Cancer Father        neuroendocrine cancer of meninges   Cancer Maternal Grandmother        tomach dx 76   Alzheimer's disease Paternal Grandmother    Lung cancer Paternal Grandfather    Lung cancer Maternal Aunt    Colon cancer Maternal Uncle    Breast cancer Paternal Aunt        both dx 55s   Ovarian cancer Other        dx 15   Breast cancer Cousin     ALLERGIES:  is allergic to codeine and mucinex [guaifenesin er].  MEDICATIONS:  Current Outpatient Medications  Medication Sig Dispense Refill   ACCU-CHEK AVIVA PLUS test strip CHECK BL00D SUGAR ONCE OR TWICE DAILY. 100 each PRN   calcium carbonate (OS-CAL - DOSED IN MG OF ELEMENTAL CALCIUM) 1250 (500 Ca) MG tablet Take 1 tablet by mouth.     carvedilol (COREG) 25 MG tablet Take 1 tablet (25 mg total) by mouth 2 (two) times daily with a meal. 180 tablet 3   Cholecalciferol (VITAMIN D3) 125 MCG (5000 UT) CAPS Take 5,000 Units by mouth daily.     fentaNYL (DURAGESIC) 12 MCG/HR Place 1 patch onto the skin every 3 (three) days. 5 patch 0   furosemide (LASIX) 40 MG tablet Take 1 tablet (40 mg total) by mouth daily. 30 tablet 1   glipiZIDE (GLUCOTROL) 5 MG tablet Take 5 mg by mouth daily. Tablet(s) By Mouth Daily     levothyroxine (SYNTHROID) 25 MCG tablet Take 1 tablet (25 mcg total) by mouth daily before breakfast. 30 tablet 0   loperamide (IMODIUM A-D) 2 MG tablet Take 2 mg by mouth 4 (four) times daily as needed for diarrhea or loose stools.     metFORMIN (GLUCOPHAGE) 500 MG tablet Take 1,000 mg by mouth 2 (two) times daily. 2 Tablet(s) By Mouth Twice Daily     pravastatin (PRAVACHOL) 20 MG tablet Take 1 tablet (20 mg total) by mouth every evening. 30 tablet 11   prochlorperazine (COMPAZINE) 10 MG tablet Take 1 tablet (10 mg total) by  mouth every 6 (six) hours as needed for nausea or vomiting. 30 tablet 0   promethazine (PHENERGAN) 25 MG tablet Take 1 tablet (25 mg total) by mouth every 6 (six) hours as needed for nausea or vomiting. 30 tablet 0   rifaximin (XIFAXAN) 550 MG TABS tablet Take 1 tablet (550 mg total) by mouth 2 (two) times daily. 60 tablet 11   spironolactone (ALDACTONE) 100 MG tablet Take 1 tablet (100 mg total) by  mouth daily. 30 tablet 1   vitamin B-12 (CYANOCOBALAMIN) 1000 MCG tablet Take 1 tablet (1,000 mcg total) by mouth daily. 90 tablet 0   Current Facility-Administered Medications  Medication Dose Route Frequency Provider Last Rate Last Admin   albumin human 25 % solution 25 g  25 g Intravenous Once [name], MD       Facility-Administered Medications Ordered in Other Visits  Medication Dose Route Frequency Provider Last Rate Last Admin   sodium chloride flush (NS) 0.9 % injection 10 mL  10 mL Intravenous PRN Earlie Server, MD   10 mL at 01/22/20 0831     PHYSICAL EXAMINATION: ECOG PERFORMANCE STATUS: 1 - Symptomatic but completely ambulatory Vitals:   06/14/21 0959  BP: 129/83  Pulse: 83  Temp: (!) 96.4 F (35.8 C)   Filed Weights   06/14/21 0959  Weight: 208 lb (94.3 kg)    Physical Exam Constitutional:      General: He is not in acute distress.    Appearance: He is obese.  HENT:     Head: Normocephalic and atraumatic.  Eyes:     General: No scleral icterus. Cardiovascular:     Rate and Rhythm: Normal rate and regular rhythm.     Heart sounds: Normal heart sounds.  Pulmonary:     Effort: Pulmonary effort is normal. No respiratory distress.     Breath sounds: No wheezing.  Abdominal:     General: Bowel sounds are normal. There is no distension.     Palpations: Abdomen is soft.  Musculoskeletal:        General: No deformity. Normal range of motion.     Cervical back: Normal range of motion and neck supple.  Skin:    General: Skin is warm and dry.     Findings: No erythema or  rash.  Neurological:     Mental Status: He is alert and oriented to person, place, and time. Mental status is at baseline.     Cranial Nerves: No cranial nerve deficit.     Coordination: Coordination normal.  Psychiatric:        Mood and Affect: Mood normal.    LABORATORY DATA:  I have reviewed the data as listed Lab Results  Component Value Date   WBC 2.8 (L) 05/12/2021   HGB 10.3 (L) 05/12/2021   HCT 31.6 (L) 05/12/2021   MCV 93.5 05/12/2021   PLT 116 (L) 05/12/2021   Recent Labs    04/14/21 1656 04/15/21 1118 04/16/21 0453 05/11/21 0932 05/12/21 0807  NA 132* 135 134* 136 132*  K 5.1 4.5 4.2 4.5 3.9  CL 105 107 108 101 100  CO2 21* 19* 20* 18* 24  GLUCOSE 140* 124* 95 146* 138*  BUN 32* 34* 36* 34* 39*  CREATININE 1.75* 1.48* 1.92* 1.75* 1.83*  CALCIUM 8.5* 8.1* 7.7* 8.7 8.4*  GFRNONAA 45* 55* 40*  --  42*  PROT 6.4*  --   --  7.0 7.1  ALBUMIN 2.9*  --   --  3.7* 3.2*  AST 36  --   --  52* 41  ALT 23  --   --  36 31  ALKPHOS 86  --   --  196* 136*  BILITOT 1.0  --   --  0.6 0.8  BILIDIR 0.2  --   --   --   --   IBILI 0.8  --   --   --   --     Iron/TIBC/Ferritin/ %Sat  Component Value Date/Time   IRON 60 09/02/2020 0822   IRON 105 10/15/2018 1325   TIBC 293 09/02/2020 0822   TIBC 244 (L) 10/15/2018 1325   FERRITIN 164 09/02/2020 0822   FERRITIN 588 (H) 10/15/2018 1325   IRONPCTSAT 21 09/02/2020 0822   IRONPCTSAT 43 10/15/2018 1325      RADIOGRAPHIC STUDIES: I have personally reviewed the radiological images as listed and agreed with the findings in the report. US Paracentesis  Result Date: 05/06/2021 INDICATION: ascites, shortness of breath EXAM: ULTRASOUND GUIDED  PARACENTESIS MEDICATIONS: None. COMPLICATIONS: None immediate. PROCEDURE: Informed written consent was obtained from the patient after a discussion of the risks, benefits and alternatives to treatment. A timeout was performed prior to the initiation of the procedure. Initial ultrasound  scanning demonstrates a large amount of ascites within the right lower abdominal quadrant. The right lower abdomen was prepped and draped in the usual sterile fashion. 1% lidocaine was used for local anesthesia. Following this, a 19 gauge Yueh catheter was introduced. An ultrasound image was saved for documentation purposes. The paracentesis was performed. The catheter was removed and a dressing was applied. The patient tolerated the procedure well without immediate post procedural complication. FINDINGS: A total of approximately 4 L of clear, straw-colored peritoneal fluid was removed. IMPRESSION: Successful ultrasound-guided paracentesis yielding 4 liters of clear peritoneal fluid. Electronically Signed   By: Albin Felling M.D.   On: 05/06/2021 17:18   US Paracentesis  Result Date: 04/15/2021 INDICATION: Recurrent ascites secondary to cirrhosis. Request for diagnostic and therapeutic paracentesis. EXAM: ULTRASOUND GUIDED PARACENTESIS MEDICATIONS: 1% lidocaine 10 mL COMPLICATIONS: None immediate. PROCEDURE: Informed written consent was obtained from the patient after a discussion of the risks, benefits and alternatives to treatment. A timeout was performed prior to the initiation of the procedure. Initial ultrasound scanning demonstrates a large amount of ascites within the right lateral abdomen. The right lateral abdomen was prepped and draped in the usual sterile fashion. 1% lidocaine was used for local anesthesia. Following this, a 6 Fr Safe-T-Centesis catheter was introduced. An ultrasound image was saved for documentation purposes. The paracentesis was performed. The catheter was removed and a dressing was applied. The patient tolerated the procedure well without immediate post procedural complication. FINDINGS: A total of approximately 5 L of clear yellow fluid was removed. Samples were sent to the laboratory as requested by the clinical team. IMPRESSION: Successful ultrasound-guided paracentesis  yielding 5 liters of peritoneal fluid. Read by: Gareth Eagle, PA-C Electronically Signed   By: Michaelle Birks M.D.   On: 04/15/2021 12:19   US Paracentesis  Result Date: 04/08/2021 INDICATION: Symptomatic ascites. EXAM: ULTRASOUND GUIDED PARACENTESIS MEDICATIONS: None. COMPLICATIONS: None immediate. PROCEDURE: Informed written consent was obtained from the patient after a discussion of the risks, benefits and alternatives to treatment. A timeout was performed prior to the initiation of the procedure. Initial ultrasound scanning demonstrates a large amount of ascites within the right lower abdominal quadrant. The right lower abdomen was prepped and draped in the usual sterile fashion. 1% lidocaine was used for local anesthesia. Following this, a 8 Fr Safe-T-Centesis catheter was introduced. An ultrasound image was saved for documentation purposes. The paracentesis was performed. The catheter was removed and a dressing was applied. The patient tolerated the procedure well without immediate post procedural complication. Patient did not receive intravenous albumin FINDINGS: A total of approximately 4.0 of serous ascitic fluid was removed. Samples were sent to the laboratory as requested by the clinical team. IMPRESSION: Successful ultrasound-guided paracentesis yielding  4.0 liters of ascites. Michaelle Birks, MD [facility] Radiology Electronically Signed   By: Michaelle Birks M.D.   On: 04/08/2021 18:21   DG Chest Portable 1 View  Result Date: 04/14/2021 CLINICAL DATA:  Short of breath, cirrhosis, abdominal distension EXAM: PORTABLE CHEST 1 VIEW COMPARISON:  09/05/2018 FINDINGS: Single frontal view of the chest demonstrates right chest wall port via internal jugular approach tip overlying superior vena cava. The cardiac silhouette is unremarkable. No airspace disease, effusion, or pneumothorax. IMPRESSION: 1. No acute intrathoracic process. Electronically Signed   By:  Randa Ngo M.D.   On: 04/14/2021 23:46   MR TOTAL SPINE METS SCREENING  Result Date: 04/12/2021 CLINICAL DATA:  Renal carcinoma. . Kidney CA, hx radiation therapy x 3 months ago; previous c-spine surgery 2015; mid-back pain x 2-3 months; known tumor in spine /// EXAM: MRI TOTAL SPINE WITHOUT AND WITH CONTRAST TECHNIQUE: Multisequence MR imaging of the spine from the cervical spine to the sacrum was performed prior to and following IV contrast administration for evaluation of spinal metastatic disease. CONTRAST:  61m GADAVIST GADOBUTROL 1 MMOL/ML IV SOLN COMPARISON:  None. FINDINGS: Alignment:  Physiologic. Vertebrae: There is a hyperenhancing lesion within the posterior T9 vertebral body. No pathologic fracture. There is a much smaller lesion within the posterior T8 vertebral body. No other contrast-enhancing lesions of the spine. Cord:  Normal signal and morphology. Disc levels: No spinal canal stenosis. IMPRESSION: 1. Enhancing lesion within the posterior T9 vertebral body, consistent with metastatic disease. No pathologic fracture. 2. Nonspecific focus of contrast enhancement in the posterior T8 vertebral body, which may be a small metastatic lesion. 3. No spinal canal or neural foraminal stenosis. Electronically Signed   By: KUlyses JarredM.D.   On: 04/12/2021 03:40   [place] (ABDOMEN LIMITED)  Result Date: 05/20/2021 CLINICAL DATA:  Evaluate ascites for paracentesis. EXAM: LIMITED ABDOMEN ULTRASOUND FOR ASCITES TECHNIQUE: Limited ultrasound survey for ascites was performed in all four abdominal quadrants. COMPARISON:  05/12/2021 FINDINGS: Minimal ascites in left upper quadrant. Minimal ascites in the right lower quadrant. No ascites in the right upper quadrant. Fluid reservoir in the left lower quadrant related to the penile implants. IMPRESSION: Minimal ascites.  Paracentesis not performed. Electronically Signed   By: AMarkus DaftM.D.   On: 05/20/2021 16:26   [place] (ABDOMEN  LIMITED)  Result Date: 05/12/2021 CLINICAL DATA:  Evaluate for ascites and potential paracentesis. EXAM: LIMITED ABDOMEN ULTRASOUND FOR ASCITES TECHNIQUE: Limited ultrasound survey for ascites was performed in all four abdominal quadrants. COMPARISON:  05/06/2021 FINDINGS: Minimal fluid in the right lower quadrant. No significant fluid on left side of the abdomen. IMPRESSION: Minimal ascites in the right lower quadrant. Paracentesis not performed due to the small amount of fluid. Electronically Signed   By: AMarkus DaftM.D.   On: 05/12/2021 15:07   [place]IVER DOPPLER  Result Date: 04/16/2021 CLINICAL DATA:  Ascites abdominal pain.  Rule out venous thrombosis. EXAM: DUPLEX ULTRASOUND OF LIVER TECHNIQUE: Color and duplex Doppler ultrasound was performed to evaluate the hepatic in-flow and out-flow vessels. COMPARISON:  IR ultrasound, 04/15/2021. CT of orbits, 02/01/2021 and 10/12/2020. FINDINGS: Liver: Increased hepatic parenchymal echogenicity, and macronodular contour. No focal lesion, mass or intrahepatic biliary ductal dilatation. Main Portal Vein size: 2.9 cm Portal Vein Velocities Main Prox:  31 cm/sec Main Mid: 40 cm/sec Main Dist:  39 cm/sec Right: 29 cm/sec Left: 31 cm/sec Hepatic Vein Velocities Right:  30 cm/sec Middle:  32 cm/sec  Left:  29 cm/sec IVC: Present and patent with normal respiratory phasicity. Hepatic Artery Velocity:  90 cm/sec Splenic Vein Velocity:  39 cm/sec Spleen: 19 x 14 x 7 cm with a total volume of 969 cm^3 (411 cm^3 is upper limit normal) Portal Vein Occlusion/Thrombus: No Splenic Vein Occlusion/Thrombus: No Ascites: Trace residual intra-abdominal ascites. Recent paracentesis the day prior, on 04/15/2021. Varices: None IMPRESSION: 1. No sonographic evidence of portal or hepatic venous obstruction. 2. Cirrhotic morphology of liver with splenomegaly and portal venous velocity at the upper limit of normal, consistent with portal venous hypertension. 3. Trace residual intra-abdominal  ascites, status post recent paracentesis. Michaelle Birks, MD [facility] Radiology Electronically Signed   By: Michaelle Birks M.D.   On: 04/16/2021 10:12   CT CHEST ABDOMEN PELVIS WO CONTRAST  Result Date: 05/31/2021 CLINICAL DATA:  Renal cell carcinoma.  Restaging. EXAM: CT CHEST, ABDOMEN AND PELVIS WITHOUT CONTRAST TECHNIQUE: Multidetector CT imaging of the chest, abdomen and pelvis was performed following the standard protocol without IV contrast. COMPARISON:  Chest CT 02/03/2021.  Abdomen pelvis CT 02/01/2021 FINDINGS: CT CHEST FINDINGS Cardiovascular: The heart size is normal. No substantial pericardial effusion. Coronary artery calcification is evident. Mild atherosclerotic calcification is noted in the wall of the thoracic aorta. Right Port-A-Cath tip is positioned in the mid right atrium. Mediastinum/Nodes: No mediastinal lymphadenopathy. No evidence for gross hilar lymphadenopathy although assessment is limited by the lack of intravenous contrast on the current study. The esophagus has normal imaging features. There is no axillary lymphadenopathy. Lungs/Pleura: Scattered areas of peripheral architectural distortion are similar to prior. 8 mm left upper lobe nodule visible on image 81/series 3 was 2 mm previously. No focal airspace consolidation. There is no evidence of pleural effusion. Musculoskeletal: Interval thoracic fusion above and below the T9 metastatic lesion. CT ABDOMEN PELVIS FINDINGS Hepatobiliary: Nodular liver contour is compatible with cirrhosis. Small subtle noncalcified gallstones evident. No intrahepatic or extrahepatic biliary dilation. Pancreas: No focal mass lesion. No dilatation of the main duct. No intraparenchymal cyst. No peripancreatic edema. Spleen: Spleen is enlarged. Adrenals/Urinary Tract: No adrenal nodule or mass. Left kidney surgically absent. Right kidney and ureter unremarkable on noncontrast imaging. Bladder is nondistended.  Stomach/Bowel: Stomach is unremarkable. No gastric wall thickening. No evidence of outlet obstruction. Duodenum is normally positioned as is the ligament of Treitz. No small bowel wall thickening. No small bowel dilatation. The terminal ileum is normal. The appendix is not well visualized, but there is no edema or inflammation in the region of the cecum. No gross colonic mass. No colonic wall thickening. Vascular/Lymphatic: There is advanced atherosclerotic calcification of the abdominal aorta without aneurysm. There is no gastrohepatic or hepatoduodenal ligament lymphadenopathy. No retroperitoneal or mesenteric lymphadenopathy. Haziness in the retroperitoneal space is unchanged. No pelvic sidewall lymphadenopathy. Reproductive: Stable.  Penile prosthesis again noted. Other: Small volume ascites noted in the abdomen and pelvis, mildly increased in the interval. Musculoskeletal: Previously described tiny lucency in the L3 vertebral body is stable. IMPRESSION: 1. 8 mm left upper lobe pulmonary nodule was 2 mm previously. Interval progression highly concerning for metastatic disease although primary bronchogenic neoplasm is a consideration. 2. Interval thoracic fusion above and below the T9 metastatic lesion. 3. Previously described tiny lucency inferior L3 vertebral body is stable. 4. Cirrhosis with splenomegaly and small volume ascites. 5. Cholelithiasis. 6. Aortic Atherosclerosis (ICD10-I70.0). Electronically Signed   By: Misty Stanley M.D.   On: 05/31/2021 10:01      ASSESSMENT & PLAN:  1. Renal cell carcinoma of left kidney ([place])   2. Anemia due to stage 3b chronic kidney disease ([place])   3. Cirrhosis of liver with ascites, unspecified hepatic cirrhosis type ([place])   4. Thrombocytopenia ([facility])   5. 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  05/30/2021, CT chest abdomen pelvis showed 8 mm left  upper lobe pulmonary nodule, increased size compared to 2 mm previously.  Interval progression highly concerning for metastatic disease.  Primary bronchogenic neoplasm is also another consideration. Interval thoracic fusion above and below the T10.  Previously described tiny lucency inferior L3 vertebral body is stable.  Cirrhosis with splenomegaly and small volume ascites.  Cholelithiasis.  Aortic atherosclerosis  I recommend patient to have a discussion with radiation oncology for feasibility of SBRT of this nodule. I recommend to hold off treatment given that his disease otherwise stable except the lung nodule progression which hopefully can be treated with SBRT.  He has an appointment with Dr. Baruch Gouty this week. We discussed about other options- VGEF inhibitors when disease progresses. Patient agrees with the plan.   # T9 lesion, s/p resection and spine fusion.  Pathology is positive for RCC. It has been +8 weeks since his surgery. Will resume Xgeva   # uncompensated liver cirrhosis, recurrent ascites, I will defer management to Dr.Anna.   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  # Elevated TSH, normal T4 level.  Patient feels very fatigued. 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  Recommend to start low-dose Synthroid supplementation.   Follow-up to be determined.    All questions were answered. The patient knows to call the clinic with any problems questions or concerns.   Earlie Server, MD, PhD 06/14/2021

## 2021-06-15 ENCOUNTER — Other Ambulatory Visit: Payer: Self-pay | Admitting: Obstetrics and Gynecology

## 2021-06-15 ENCOUNTER — Telehealth: Payer: Self-pay

## 2021-06-15 NOTE — Telephone Encounter (Signed)
error 

## 2021-06-15 NOTE — Telephone Encounter (Signed)
Spoke to patient and he stated that he took medication until 2 days ago because he ran out of medication. Pt informed of recommendation for him to resume xgeva.

## 2021-06-15 NOTE — Telephone Encounter (Signed)
-----   Message from Earlie Server, MD sent at 06/14/2021  8:52 PM EST ----- Please check with him if he is still taking synthroid.  Let him know that it has been 8 weeks since his spine surgery, I recommend him to resume xgeva  Please arrange him to get lab - BMP, TSH, free T4 [ I ordered] when he comes to do [place]. thanks

## 2021-06-16 ENCOUNTER — Telehealth: Payer: Self-pay | Admitting: Gastroenterology

## 2021-06-16 ENCOUNTER — Encounter: Payer: Self-pay | Admitting: Oncology

## 2021-06-16 ENCOUNTER — Ambulatory Visit
Admission: RE | Admit: 2021-06-16 | Discharge: 2021-06-16 | Disposition: A | Discharge status: Home / Self Care | Payer: BC Managed Care – PPO | Source: Ambulatory Visit | Attending: Radiation Oncology | Admitting: Radiation Oncology

## 2021-06-16 ENCOUNTER — Inpatient Hospital Stay: Payer: Medicare Other

## 2021-06-16 ENCOUNTER — Other Ambulatory Visit: Payer: Self-pay | Admitting: Oncology

## 2021-06-16 ENCOUNTER — Encounter: Payer: Self-pay | Admitting: Radiation Oncology

## 2021-06-16 ENCOUNTER — Other Ambulatory Visit: Payer: Self-pay

## 2021-06-16 VITALS — BP 113/72 | HR 79 | Temp 96.7°F | Wt 208.9 lb

## 2021-06-16 DIAGNOSIS — C642 Malignant neoplasm of left kidney, except renal pelvis: Secondary | ICD-10-CM

## 2021-06-16 DIAGNOSIS — Z923 Personal history of irradiation: Secondary | ICD-10-CM | POA: Insufficient documentation

## 2021-06-16 DIAGNOSIS — R7989 Other specified abnormal findings of blood chemistry: Secondary | ICD-10-CM

## 2021-06-16 DIAGNOSIS — C7951 Secondary malignant neoplasm of bone: Secondary | ICD-10-CM | POA: Diagnosis not present

## 2021-06-16 DIAGNOSIS — R918 Other nonspecific abnormal finding of lung field: Secondary | ICD-10-CM | POA: Insufficient documentation

## 2021-06-16 DIAGNOSIS — N1832 Chronic kidney disease, stage 3b: Secondary | ICD-10-CM

## 2021-06-16 LAB — BASIC METABOLIC PANEL
Anion gap: 12 (ref 5–15)
BUN: 83 mg/dL — ABNORMAL HIGH (ref 6–20)
CO2: 21 mmol/L — ABNORMAL LOW (ref 22–32)
Calcium: 8.7 mg/dL — ABNORMAL LOW (ref 8.9–10.3)
Chloride: 97 mmol/L — ABNORMAL LOW (ref 98–111)
Creatinine, Ser: 2.28 mg/dL — ABNORMAL HIGH (ref 0.61–1.24)
GFR, Estimated: 32 mL/min — ABNORMAL LOW (ref >60)
Glucose, Bld: 185 mg/dL — ABNORMAL HIGH (ref 70–99)
Potassium: 4.2 mmol/L (ref 3.5–5.1)
Sodium: 130 mmol/L — ABNORMAL LOW (ref 135–145)

## 2021-06-16 LAB — TSH: TSH: 4.391 u[IU]/mL (ref 0.350–4.500)

## 2021-06-16 LAB — T4, FREE: Free T4: 1.03 ng/dL (ref 0.61–1.12)

## 2021-06-16 MED ORDER — FUROSEMIDE 40 MG PO TABS: 40.0000 mg | ORAL_TABLET | Freq: Every day | ORAL | 3 refills | Status: DC

## 2021-06-16 MED ORDER — DENOSUMAB 120 MG/1.7ML ~~LOC~~ SOLN
120.0000 mg | Freq: Once | SUBCUTANEOUS | Status: AC
  Administered 2021-06-16: 10:00:00 120 mg via SUBCUTANEOUS
  Filled 2021-06-16: qty 1.7

## 2021-06-16 MED ORDER — SPIRONOLACTONE 100 MG PO TABS: 100.0000 mg | ORAL_TABLET | Freq: Every day | ORAL | 3 refills | Status: DC

## 2021-06-16 NOTE — Telephone Encounter (Signed)
Inbound call from pt requesting a refill for Spionolactone and Furosemide.

## 2021-06-16 NOTE — Telephone Encounter (Signed)
Called patient back and he stated that Dr. [name] was to refill his furesonide and aldactone, therefore, I went ahead and sent his refills to his pharmacy.

## 2021-06-16 NOTE — Progress Notes (Signed)
Radiation Oncology Follow up Note old patient new area left upper lobe lung nodule  Name: Henry Moore   Date:   06/16/2021 MRN:  [id number] DOB: [birth year]    This 60 y.o. male presents to the clinic today for evaluation of a new left upper lobe lung nodule and patient with known stage IV renal cell carcinoma previously treated to his T9 vertebral body for metastatic disease.  REFERRING PROVIDER: Albina Billet, MD  HPI: Patient is a 60 year old male who completed palliative radiation therapy to his T8-9 vertebral body for renal cell carcinoma.  Patient did have resection spine fusion and path was positive for renal cell carcinoma.  He has some infection in his incision site which is being treated by surgeons.  On routine surveillance they picked up a new nodule in his left upper lobe consistent with either primary lung cancer or metastatic renal cell carcinoma.  He is asymptomatic he does have a cough nonproductive which she has had for many years.  He has no hemoptysis or chest tightness.  He is seen today for consideration of SBRT  COMPLICATIONS OF TREATMENT: none  FOLLOW UP COMPLIANCE: keeps appointments   PHYSICAL EXAM:  BP 113/72   Pulse 79   Temp (!) 96.7 F (35.9 C) (Tympanic)   Wt 208 lb 14.4 oz (94.8 kg)   BMI 27.56 kg/m  Well-developed well-nourished patient in NAD. HEENT reveals PERLA, EOMI, discs not visualized.  Oral cavity is clear. No oral mucosal lesions are identified. Neck is clear without evidence of cervical or supraclavicular adenopathy. Lungs are clear to A&P. Cardiac examination is essentially unremarkable with regular rate and rhythm without murmur rub or thrill. Abdomen is benign with no organomegaly or masses noted. Motor sensory and DTR levels are equal and symmetric in the upper and lower extremities. Cranial nerves II through XII are grossly intact. Proprioception is intact. No peripheral adenopathy or edema is identified. No motor or sensory levels are noted.  Crude visual fields are within normal range.  RADIOLOGY RESULTS: CT scans reviewed compatible with above-stated findings  PLAN: Present time patient has a new nodule density in the left upper lobe consistent with either metastatic disease or new primary lung cancer.  At this time I have discussed with him options of SBRT which would take care of this lesion with minimal side effects.  I would like him to heal up from his recent infection and surgery and I have ordered CT simulation with 4-dimensional treatment planning and motion restriction for the middle of December.  The lesion now measures on my estimation about 1 cm.  Risks and benefits of and minimal side effect profile of SBRT was explained to the patient.  I would like to take this opportunity to thank you for allowing me to participate in the care of your patient.Noreene Filbert, MD

## 2021-06-20 ENCOUNTER — Encounter: Payer: Self-pay | Admitting: Oncology

## 2021-06-23 ENCOUNTER — Encounter: Payer: Self-pay | Admitting: Oncology

## 2021-06-30 ENCOUNTER — Other Ambulatory Visit: Payer: Self-pay

## 2021-06-30 ENCOUNTER — Inpatient Hospital Stay: Payer: Medicare Other | Attending: Hospice and Palliative Medicine | Admitting: Hospice and Palliative Medicine

## 2021-06-30 DIAGNOSIS — C642 Malignant neoplasm of left kidney, except renal pelvis: Secondary | ICD-10-CM | POA: Diagnosis not present

## 2021-06-30 DIAGNOSIS — Z452 Encounter for adjustment and management of vascular access device: Secondary | ICD-10-CM | POA: Insufficient documentation

## 2021-06-30 NOTE — Progress Notes (Signed)
Virtual Visit via telephone note  I connected with Henry Moore on 06/30/21 at  1:30 PM EST by telephone and verified that I am speaking with the correct person using two identifiers.   I discussed the limitations, risks, security and privacy concerns of performing an evaluation and management service by telephone and the availability of in person appointments. I also discussed with the patient that there may be a patient responsible charge related to this service. The patient expressed understanding and agreed to proceed.   History of Present Illness: Henry Moore is a 60 y.o. 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  patient was referred to palliative care to help address goals and manage ongoing symptoms.   Observations/Objective: I called and spoke with patient by phone.   Patient reports he is doing well.  He denies significant pain or other symptomatic complaints.  He reports that he has improved functionally and is able to get out of the house and do some Christmas shopping.  His appetite is adequate.  He is not requiring pain medication at present.  No issues with medications nor need for refills at present time.  Patient is status post spinal fusion and resection of T9 lesion.  He reports that he has a small open area from surgery and was evaluated at [facility] wound center.  He was told that this will heal slowly.  Patient has history of recurrent ascites but has not required recent paracentesis.  CT of the chest, abdomen, and pelvis on 05/30/2021 showed increased size and left upper lobe pulmonary nodule concerning for interval progression.  Patient was referred for SBRT.  Assessment and Plan: RCC -on immunotherapy.  Seems to be doing well symptomatically.  We will follow  Neoplasm related pain -improved.  Restart transdermal fentanyl if needed  Follow Up  Instructions: Follow-up telephone visit 1 to 2 months   I discussed the assessment and treatment plan with the patient. The patient was provided an opportunity to ask questions and all were answered. The patient agreed with the plan and demonstrated an understanding of the instructions.   The patient was advised to call back or seek an in-person evaluation if the symptoms worsen or if the condition fails to improve as anticipated.  I provided 5 minutes of non-face-to-face time during this encounter.   Henry Hong, NP

## 2021-07-05 ENCOUNTER — Other Ambulatory Visit: Payer: Self-pay

## 2021-07-05 ENCOUNTER — Inpatient Hospital Stay: Payer: Medicare Other

## 2021-07-05 DIAGNOSIS — Z95828 Presence of other vascular implants and grafts: Secondary | ICD-10-CM

## 2021-07-05 DIAGNOSIS — C642 Malignant neoplasm of left kidney, except renal pelvis: Secondary | ICD-10-CM | POA: Diagnosis not present

## 2021-07-05 DIAGNOSIS — Z452 Encounter for adjustment and management of vascular access device: Secondary | ICD-10-CM | POA: Diagnosis not present

## 2021-07-05 MED ORDER — SODIUM CHLORIDE 0.9% FLUSH
10.0000 mL | Freq: Once | INTRAVENOUS | Status: AC
  Administered 2021-07-05: 10 mL via INTRAVENOUS
  Filled 2021-07-05: qty 10

## 2021-07-05 MED ORDER — HEPARIN SOD (PORK) LOCK FLUSH 100 UNIT/ML IV SOLN
500.0000 [IU] | Freq: Once | INTRAVENOUS | Status: AC
  Administered 2021-07-05: 500 [IU] via INTRAVENOUS
  Filled 2021-07-05: qty 5

## 2021-07-05 MED ORDER — HEPARIN SOD (PORK) LOCK FLUSH 100 UNIT/ML IV SOLN
INTRAVENOUS | Status: AC
  Filled 2021-07-05: qty 5

## 2021-07-18 ENCOUNTER — Ambulatory Visit: Admission: RE | Admit: 2021-07-18 | Payer: BC Managed Care – PPO | Source: Ambulatory Visit

## 2021-07-18 ENCOUNTER — Encounter: Payer: Self-pay | Admitting: Oncology

## 2021-07-18 DIAGNOSIS — R918 Other nonspecific abnormal finding of lung field: Secondary | ICD-10-CM | POA: Diagnosis not present

## 2021-07-18 DIAGNOSIS — Z510 Encounter for antineoplastic radiation therapy: Secondary | ICD-10-CM | POA: Diagnosis not present

## 2021-07-19 DIAGNOSIS — R918 Other nonspecific abnormal finding of lung field: Secondary | ICD-10-CM | POA: Diagnosis not present

## 2021-07-26 ENCOUNTER — Encounter: Payer: Self-pay | Admitting: Oncology

## 2021-07-26 ENCOUNTER — Telehealth: Payer: Self-pay

## 2021-07-26 NOTE — Telephone Encounter (Signed)
Port flush in Jan can be moved to same day as appt with MD

## 2021-07-26 NOTE — Telephone Encounter (Signed)
Patient will have final RT on 1/12, please schedule him for lab/MD 3 weeks after final RT and notigy pt of appt.

## 2021-07-27 ENCOUNTER — Telehealth: Payer: Self-pay | Admitting: Gastroenterology

## 2021-07-27 ENCOUNTER — Encounter: Payer: Self-pay | Admitting: Oncology

## 2021-07-27 NOTE — Telephone Encounter (Signed)
Inbound call from pt requesting to cancel his procedure. Pt will call back once he is done with radiation. Thank you.

## 2021-07-28 ENCOUNTER — Ambulatory Visit
Admission: RE | Admit: 2021-07-28 | Discharge: 2021-07-28 | Disposition: A | Discharge status: Home / Self Care | Payer: BC Managed Care – PPO | Source: Ambulatory Visit | Attending: Radiation Oncology | Admitting: Radiation Oncology

## 2021-07-28 DIAGNOSIS — R918 Other nonspecific abnormal finding of lung field: Secondary | ICD-10-CM | POA: Diagnosis not present

## 2021-08-02 ENCOUNTER — Ambulatory Visit
Admission: RE | Admit: 2021-08-02 | Discharge: 2021-08-02 | Disposition: A | Discharge status: Home / Self Care | Payer: Medicare Other | Source: Ambulatory Visit | Attending: Radiation Oncology | Admitting: Radiation Oncology

## 2021-08-02 DIAGNOSIS — R918 Other nonspecific abnormal finding of lung field: Secondary | ICD-10-CM | POA: Diagnosis present

## 2021-08-02 DIAGNOSIS — Z510 Encounter for antineoplastic radiation therapy: Secondary | ICD-10-CM | POA: Diagnosis not present

## 2021-08-02 NOTE — Telephone Encounter (Signed)
Called the endoscopy unit and let them know that the patient wants to cancel until he is done with radiation. He will then call us back.

## 2021-08-04 ENCOUNTER — Ambulatory Visit
Admission: RE | Admit: 2021-08-04 | Discharge: 2021-08-04 | Disposition: A | Discharge status: Home / Self Care | Payer: Medicare Other | Source: Ambulatory Visit | Attending: Radiation Oncology | Admitting: Radiation Oncology

## 2021-08-04 DIAGNOSIS — R918 Other nonspecific abnormal finding of lung field: Secondary | ICD-10-CM | POA: Diagnosis not present

## 2021-08-09 ENCOUNTER — Ambulatory Visit
Admission: RE | Admit: 2021-08-09 | Discharge: 2021-08-09 | Disposition: A | Discharge status: Home / Self Care | Payer: Medicare Other | Source: Ambulatory Visit | Attending: Radiation Oncology | Admitting: Radiation Oncology

## 2021-08-09 DIAGNOSIS — R918 Other nonspecific abnormal finding of lung field: Secondary | ICD-10-CM | POA: Diagnosis not present

## 2021-08-11 ENCOUNTER — Ambulatory Visit: Payer: Medicare Other

## 2021-08-12 ENCOUNTER — Ambulatory Visit
Admission: RE | Admit: 2021-08-12 | Discharge: 2021-08-12 | Disposition: A | Discharge status: Home / Self Care | Payer: Medicare Other | Source: Ambulatory Visit | Attending: Radiation Oncology | Admitting: Radiation Oncology

## 2021-08-12 ENCOUNTER — Ambulatory Visit
Admission: RE | Admit: 2021-08-12 | Payer: BC Managed Care – PPO | Source: Home / Self Care | Admitting: Gastroenterology

## 2021-08-12 ENCOUNTER — Encounter: Admission: RE | Payer: Self-pay | Source: Home / Self Care

## 2021-08-12 DIAGNOSIS — R918 Other nonspecific abnormal finding of lung field: Secondary | ICD-10-CM | POA: Diagnosis not present

## 2021-08-12 SURGERY — ESOPHAGOGASTRODUODENOSCOPY (EGD) WITH PROPOFOL
Anesthesia: General

## 2021-08-23 ENCOUNTER — Other Ambulatory Visit: Payer: Self-pay | Admitting: *Deleted

## 2021-08-23 DIAGNOSIS — C642 Malignant neoplasm of left kidney, except renal pelvis: Secondary | ICD-10-CM

## 2021-09-01 ENCOUNTER — Inpatient Hospital Stay (HOSPITAL_BASED_OUTPATIENT_CLINIC_OR_DEPARTMENT_OTHER): Payer: Medicare Other | Admitting: Oncology

## 2021-09-01 ENCOUNTER — Inpatient Hospital Stay: Payer: Medicare Other

## 2021-09-01 ENCOUNTER — Encounter: Payer: Self-pay | Admitting: Oncology

## 2021-09-01 ENCOUNTER — Inpatient Hospital Stay: Payer: Medicare Other | Attending: Hospice and Palliative Medicine | Admitting: Hospice and Palliative Medicine

## 2021-09-01 ENCOUNTER — Other Ambulatory Visit: Payer: Self-pay

## 2021-09-01 VITALS — BP 109/70 | HR 73 | Temp 96.3°F | Wt 217.5 lb

## 2021-09-01 DIAGNOSIS — Z7984 Long term (current) use of oral hypoglycemic drugs: Secondary | ICD-10-CM | POA: Diagnosis not present

## 2021-09-01 DIAGNOSIS — R188 Other ascites: Secondary | ICD-10-CM

## 2021-09-01 DIAGNOSIS — C642 Malignant neoplasm of left kidney, except renal pelvis: Secondary | ICD-10-CM | POA: Diagnosis not present

## 2021-09-01 DIAGNOSIS — N1832 Chronic kidney disease, stage 3b: Secondary | ICD-10-CM | POA: Insufficient documentation

## 2021-09-01 DIAGNOSIS — I129 Hypertensive chronic kidney disease with stage 1 through stage 4 chronic kidney disease, or unspecified chronic kidney disease: Secondary | ICD-10-CM | POA: Diagnosis not present

## 2021-09-01 DIAGNOSIS — Z800 Family history of malignant neoplasm of digestive organs: Secondary | ICD-10-CM | POA: Diagnosis not present

## 2021-09-01 DIAGNOSIS — E1122 Type 2 diabetes mellitus with diabetic chronic kidney disease: Secondary | ICD-10-CM | POA: Diagnosis not present

## 2021-09-01 DIAGNOSIS — Z794 Long term (current) use of insulin: Secondary | ICD-10-CM | POA: Diagnosis not present

## 2021-09-01 DIAGNOSIS — K7460 Unspecified cirrhosis of liver: Secondary | ICD-10-CM | POA: Diagnosis not present

## 2021-09-01 DIAGNOSIS — Z79899 Other long term (current) drug therapy: Secondary | ICD-10-CM | POA: Insufficient documentation

## 2021-09-01 DIAGNOSIS — C7951 Secondary malignant neoplasm of bone: Secondary | ICD-10-CM | POA: Diagnosis not present

## 2021-09-01 DIAGNOSIS — I700 Atherosclerosis of aorta: Secondary | ICD-10-CM | POA: Diagnosis not present

## 2021-09-01 DIAGNOSIS — G8929 Other chronic pain: Secondary | ICD-10-CM | POA: Insufficient documentation

## 2021-09-01 DIAGNOSIS — R7989 Other specified abnormal findings of blood chemistry: Secondary | ICD-10-CM

## 2021-09-01 DIAGNOSIS — E785 Hyperlipidemia, unspecified: Secondary | ICD-10-CM | POA: Diagnosis not present

## 2021-09-01 DIAGNOSIS — I2510 Atherosclerotic heart disease of native coronary artery without angina pectoris: Secondary | ICD-10-CM | POA: Diagnosis not present

## 2021-09-01 DIAGNOSIS — Z803 Family history of malignant neoplasm of breast: Secondary | ICD-10-CM | POA: Insufficient documentation

## 2021-09-01 DIAGNOSIS — D696 Thrombocytopenia, unspecified: Secondary | ICD-10-CM | POA: Insufficient documentation

## 2021-09-01 DIAGNOSIS — R161 Splenomegaly, not elsewhere classified: Secondary | ICD-10-CM | POA: Diagnosis not present

## 2021-09-01 DIAGNOSIS — Z95828 Presence of other vascular implants and grafts: Secondary | ICD-10-CM

## 2021-09-01 DIAGNOSIS — R946 Abnormal results of thyroid function studies: Secondary | ICD-10-CM | POA: Diagnosis not present

## 2021-09-01 DIAGNOSIS — Z87891 Personal history of nicotine dependence: Secondary | ICD-10-CM | POA: Diagnosis not present

## 2021-09-01 LAB — COMPREHENSIVE METABOLIC PANEL
ALT: 24 U/L (ref 0–44)
AST: 36 U/L (ref 15–41)
Albumin: 4 g/dL (ref 3.5–5.0)
Alkaline Phosphatase: 116 U/L (ref 38–126)
Anion gap: 6 (ref 5–15)
BUN: 50 mg/dL — ABNORMAL HIGH (ref 6–20)
CO2: 25 mmol/L (ref 22–32)
Calcium: 9.2 mg/dL (ref 8.9–10.3)
Chloride: 99 mmol/L (ref 98–111)
Creatinine, Ser: 2.09 mg/dL — ABNORMAL HIGH (ref 0.61–1.24)
GFR, Estimated: 36 mL/min — ABNORMAL LOW (ref >60)
Glucose, Bld: 159 mg/dL — ABNORMAL HIGH (ref 70–99)
Potassium: 3.7 mmol/L (ref 3.5–5.1)
Sodium: 130 mmol/L — ABNORMAL LOW (ref 135–145)
Total Bilirubin: 0.9 mg/dL (ref 0.3–1.2)
Total Protein: 7.8 g/dL (ref 6.5–8.1)

## 2021-09-01 LAB — CBC WITH DIFFERENTIAL/PLATELET
Abs Immature Granulocytes: 0.01 10*3/uL (ref 0.00–0.07)
Basophils Absolute: 0 10*3/uL (ref 0.0–0.1)
Basophils Relative: 1 %
Eosinophils Absolute: 0.1 10*3/uL (ref 0.0–0.5)
Eosinophils Relative: 5 %
HCT: 30.4 % — ABNORMAL LOW (ref 39.0–52.0)
Hemoglobin: 10.7 g/dL — ABNORMAL LOW (ref 13.0–17.0)
Immature Granulocytes: 0 %
Lymphocytes Relative: 10 %
Lymphs Abs: 0.3 10*3/uL — ABNORMAL LOW (ref 0.7–4.0)
MCH: 32.2 pg (ref 26.0–34.0)
MCHC: 35.2 g/dL (ref 30.0–36.0)
MCV: 91.6 fL (ref 80.0–100.0)
Monocytes Absolute: 0.3 10*3/uL (ref 0.1–1.0)
Monocytes Relative: 13 %
Neutro Abs: 1.9 10*3/uL (ref 1.7–7.7)
Neutrophils Relative %: 71 %
Platelets: 56 10*3/uL — ABNORMAL LOW (ref 150–400)
RBC: 3.32 MIL/uL — ABNORMAL LOW (ref 4.22–5.81)
RDW: 13.3 % (ref 11.5–15.5)
WBC: 2.6 10*3/uL — ABNORMAL LOW (ref 4.0–10.5)
nRBC: 0 % (ref 0.0–0.2)

## 2021-09-01 MED ORDER — HEPARIN SOD (PORK) LOCK FLUSH 100 UNIT/ML IV SOLN
500.0000 [IU] | Freq: Once | INTRAVENOUS | Status: AC
  Administered 2021-09-01: 500 [IU] via INTRAVENOUS
  Filled 2021-09-01: qty 5

## 2021-09-01 NOTE — Progress Notes (Addendum)
Hematology/Oncology follow-up [phone] [phone]   Patient Care Team: Albina Billet, MD as PCP - General (Internal Medicine) Earlie Server, MD as Consulting Physician (Hematology and Oncology)  REFERRING PROVIDER: Albina Billet, MD  CHIEF COMPLAINTS/REASON FOR VISIT:  Follow-up for kidney cancer treatments  HISTORY OF PRESENTING ILLNESS:   Henry Moore is a  61 y.o.  male with PMH listed below was seen in consultation at the request of  Albina Billet, MD  for evaluation of kidney cancer Extensive medical records were reviewed Patient had MRI lumbar spine without contrast done on 07/13/2018 which showed mired lumbar degenerative disease. CT thoracic spine was done on 08/05/2018 which showed 5 cm left renal mass. 09/04/2018 CT abdomen and pelvis with and without contrast showed 5 cm round enhancing solid mass in the left kidney.  No involvement of left renal vein.  No lymphadenopathy Morphologic changes consistent with cirrhosis and the portal hypertension.  Small volume ascites and splenomegaly. . 10/16/2018 Left nephrectomy showed renal cell carcinoma, clear-cell type, nuclear grade 4, tumor extends into the renal vein and renal sinus fat.  Urethral, vascular and or resection margins are negative for tumor pT3a Nx Mx  01/28/2019 patient had another CT chest abdomen pelvis done with contrast Showed interval left nephrectomy.  Scattered tiny pulmonary nodules bilaterally.  Nonspecific.  No other evidence of metastatic disease.  Cirrhosis changes extensive coronary and aortic atherosclerosis  07/14/2019 patient underwent surveillance CT chest abdomen pelvis without contrast Interval progression of pulmonary metastasis with new enlarged right paratracheal lymph node 1.7 cm, previously 0.6 cm one-point since worrisome for metastatic adenopathy.  Multiple progressive pulmonary nodules, index nodule within the lingula measures 1.3 cm, previously 3 mm. Index subpleural nodule  within the anterior right upper lobe measuring 1.8 cm, previously 3 cm Index nodule within the post lateral right lower lobe measuring 5 mm, new from previous care Aortic sclerosis.  Three-vessel coronary artery calcification noted.  Cirrhosis changes.  Splenomegaly.  Patient was referred to establish care with medical oncology for further discussion and evaluation of metastatic renal cell carcinoma. Patient reports feeling well at baseline today.  Denies any shortness of breath, cough, hemoptysis, back pain, headache. He quitted smoking 2015.  Not currently actively drinking alcohol as well. Patient has chronic back pain unchanged.  #08/14/2019-10/16/2019 nivolumab and ipilimumab. #11/06/2019-nivolumab every 2 weeks maintenance.  # Liver cirrhosis with small amount of ascites/splenomegaly patient sees gastroenterology Dr. [name].  . He will get ultrasound surveillance due in February 2021 for screening of [place].  EGD surveillance for Barrett's plan in April 2021.  # Diabetes, on Humalog sliding scale and metformin.  #Family history of cancer and personal history of RCC.  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  Status post ipilimumab and nivolumab treatment.  # 10/29/2019 CT chest abdomen pelvis without contrast images were independently reviewed by me and discussed with patient.   CT showed marked improvement with resolution of many of the pulmonary nodules, prominent reduced size of other nodules.  Considerably reduced size of the right lower paratracheal lymph node now 0.9 cm in diameter. Hepatic cirrhosis with prominent splenomegaly and trace perisplenic ascites. Chronic CT findings includes aortic atherosclerotic vascular disease.  Mild sigmoid colon  diverticulosis  # CKD, he establish care with Dr.  Singh for chronic kidney disease.  Blood pressure medication has been adjusted.  Hydrochlorothiazide decreased to 12.5 mg daily.  # NGS -foundation one PD-L1 TPS 0% no reportable mutation, MS stable. TMB 8 muts/mb VHL [id number], SETD2 BAP Genetic testing is negative.   # 07/16/2020, CT chest abdomen pelvis images were reviewed and discussed with patient Compared to July 2021 image, he has had a new patchy groundglass opacities throughout both lungs with central part of solids/airspace components.  Findings are nonspecific and could be secondary to an atypical infection, including viral pneumonia or inflammation.  Patient is asymptomatic.  I wonder the changes are secondary to his Covid infection.  Patient has been started on Levaquin to cover atypical infection by on-call physician and I recommend him to continue and complete the course. Proceed with today's immunotherapy nivolumab.  Advised patient to notify me if his symptoms get worse.  # 10/12/2020 CT chest abdomen pelvis with out contrast. New 15 mm lytic lesion in the right T9 vertebral body.  Suspicious for isolated lytic metastasis.  No findings of suspicious soft tissue metastasis in the chest abdomen or pelvis.  Residual postinfectious inflammatory scarring in the lung bilaterally.  Cirrhosis.  Splenomegaly.  Minimal ascites.  # 10/26/20 Bone scan showed Minimal increased activity noted at the right costovertebral junction at the T9 level is most likely degenerative. Similar finding noted on prior bone scan of 08/11/2019. No definite T9 vertebral body abnormal activity corresponding to lytic lesion noted on recent CT. Again the recently identified T9 vertebral body lytic lesion is suspicious and further evaluation of the thoracic spine with MRI can be obtained. No other bony abnormalities noted by bone scan. I discussed patient's case on tumor board 11/04/2020.  Bone scan probably is not  sensitive in detecting lytic lesion from [place]  Consensus reached upon clinically based on the CT scan, new bone lesion is highly likely metastasis from [place]  Options including proceed with bone lesion biopsy versus referral to radiation oncology for empiric palliative radiation.  Patient has other comorbidities including cirrhosis/pancytopenia, patient opts to proceed with empiric palliative radiation.  12/13/2020 finished RT to T9 June 2022 abscess of anal area , s/ drainage and antibiotics. July 2022, immunotherapy was held due to diarrhea.  02/01/2021  mild wall thickening of aseconding colon. Small infraumbilical ventral hernia,contains short segment of small bowel. Cirrohsis and splenomegaly. Small volume ascites, slight increase 02/03/21 chest wo contrast showed  Lytic lesion within T9 slightly increased in size., no new skeletal metastasis. Mild peripheral ground glass densities.   03/18/2021, resumed on nivolumab treatment x1. 04/01/2021, hold nivolumab due to diarrhea and upcoming surgery. # 04/08/2021 paracentesis. Cytology negative for malignancy.  # [facility]14[facility]2021[facility]. S/p paracentesis on 04/15/2021 , cytology negative.  # 04/22/21 right T9 transpedicular mass resection. T7-11 [facility] 05/06/2021 abdominal distention. S/p repeat therapeutic paracentesis.   05/30/2021, CT chest abdomen pelvis showed 8 mm left upper lobe pulmonary nodule, increased size compared to 2 mm previously.  Interval progression highly concerning for metastatic disease.  Primary bronchogenic neoplasm is also another consideration. Interval thoracic fusion above and below the T10.  Previously described tiny lucency inferior L3 vertebral body is stable.  Cirrhosis with splenomegaly and small volume ascites.  Cholelithiasis.  Aortic atherosclerosis  #08/12/2021, status post stereotactic radiation to lung  INTERVAL HISTORY Henry SAGRAVESis  a 61y.o. male who has above history reviewed by me today presents  for follow up visit for management of stage IV RCC Patient reports feeling well.  Diarrhea has completely resolved.  Diarrhea recurred for about a week when he was on radiation.  Currently no diarrhea symptoms.  He has gained weight.  Appetite is fair.  He is going through a lot of stress in life.  Chronic back pain, unchanged.  He has not needed any paracentesis.  Patient takes diuretics.  Review of Systems  Constitutional:  Positive for fatigue. Negative for appetite change, chills, fever and unexpected weight change.  HENT:   Negative for hearing loss and voice change.   Eyes:  Negative for eye problems and icterus.  Respiratory:  Negative for chest tightness, cough and shortness of breath.   Cardiovascular:  Negative for chest pain and leg swelling.  Gastrointestinal:  Negative for abdominal distention, abdominal pain, diarrhea, nausea and vomiting.  Endocrine: Negative for hot flashes.  Genitourinary:  Negative for difficulty urinating, dysuria and frequency.   Musculoskeletal:  Positive for arthralgias.       Chronic back pain  Skin:  Negative for itching and rash.       Hair loss  Neurological:  Negative for light-headedness and numbness.  Hematological:  Negative for adenopathy. Does not bruise/bleed easily.  Psychiatric/Behavioral:  Negative for confusion.    MEDICAL HISTORY:  Past Medical History:  Diagnosis Date   Cough    SINUS DRAINAGE CAUSING COUGHING , REPORTS CLEAR MUCOUS    Diabetes mellitus without complication (HCC)    Family history of breast cancer    Family history of colon cancer    Family history of stomach cancer    HTN (hypertension)    Hyperlipemia    Left renal mass    Platelets decreased ([place])    DENIES UNSUAL BLEEDING    PONV (postoperative nausea and vomiting)    Renal cell carcinoma ([place]) 08/04/2019   WBC decreased     SURGICAL HISTORY: Past Surgical History:  Procedure Laterality  Date   ANKLE SURGERY Left    ANTERIOR CERVICAL DECOMP/DISCECTOMY FUSION N/A 04/16/2014   Procedure: ANTERIOR CERVICAL DECOMPRESSION/DISCECTOMY FUSION  (ACDF C5-C7)   (2 LEVELS)     ;  Surgeon: Melina Schools, MD;  Location: [place];  Service: Orthopedics;  Laterality: N/A;   APPENDECTOMY     BACK SURGERY     ESOPHAGOGASTRODUODENOSCOPY (EGD) WITH PROPOFOL N/A 02/06/2019   Procedure: ESOPHAGOGASTRODUODENOSCOPY (EGD) WITH PROPOFOL;  Surgeon: [name], MD;  Location[facility] ENDOSCOPY;  Service: Gastroenterology;  Laterality: N/A;   ESOPHAGOGASTRODUODENOSCOPY (EGD) WITH PROPOFOL N/A 03/04/2019   Procedure: ESOPHAGOGASTRODUODENOSCOPY (EGD) WITH PROPOFOL;  Surgeon: Lin Landsman, MD;  Location: [place];  Service: Gastroenterology;  Laterality: N/A;   ESOPHAGOGASTRODUODENOSCOPY (EGD) WITH PROPOFOL N/A 04/22/2019   Procedure: ESOPHAGOGASTRODUODENOSCOPY (EGD) WITH PROPOFOL;  Surgeon: [name], MD;  Location[facility] ENDOSCOPY;  Service: Gastroenterology;  Laterality: N/A;   FRACTURE SURGERY     HYDROCELE EXCISION     KNEE ARTHROSCOPY Bilateral    LAPAROSCOPIC NEPHRECTOMY, HAND ASSISTED Left 10/16/2018   Procedure: LEFT HAND ASSISTED LAPAROSCOPIC NEPHRECTOMY;  Surgeon: Lucas Mallow, MD;  Location: [facility];  Service: Urology;  Laterality: Left;   NASAL SINUS SURGERY     X 2   PENILE PROSTHESIS IMPLANT  10 YEARS AGO   PORTA CATH INSERTION N/A 11/11/2019   Procedure: PORTA CATH INSERTION;  Surgeon: Katha Cabal, MD;  Location: [place] [facility];  Service: Cardiovascular;  Laterality: N/A;   SHOULDER SURGERY Left  SOCIAL HISTORY: Social History   Socioeconomic History   Marital status: Married    Spouse name: Not on file   Number of children: Not on file   Years of education: Not on file   Highest education level: Not on file  Occupational History   Not on file  Tobacco Use   Smoking status: Former    Packs/day: 0.25    Years: 20.00    Pack years: 5.00    Types:  Cigarettes    Quit date: 2015    Years since quitting: 8.0   Smokeless tobacco: Never  Vaping Use   Vaping Use: Never used  Substance and Sexual Activity   Alcohol use: Not Currently    Alcohol/week: 0.0 standard drinks    Comment: no alchol in 1 year   Drug use: No   Sexual activity: Not on file  Other Topics Concern   Not on file  Social History Narrative   Not on file   Social Determinants of Health   Financial Resource Strain: Not on file  Food Insecurity: Not on file  Transportation Needs: Not on file  Physical Activity: Not on file  Stress: Not on file  Social Connections: Not on file  Intimate Partner Violence: Not on file    FAMILY HISTORY: Family History  Problem Relation Age of Onset   Diabetes Mother    Cancer Mother        neuroendocrine cancer   Cancer Father        neuroendocrine cancer of meninges   Cancer Maternal Grandmother        tomach dx 12   Alzheimer's disease Paternal Grandmother    Lung cancer Paternal Grandfather    Lung cancer Maternal Aunt    Colon cancer Maternal Uncle    Breast cancer Paternal Aunt        both dx 35s   Ovarian cancer Other        dx 23   Breast cancer Cousin     ALLERGIES:  is allergic to codeine and mucinex [guaifenesin er].  MEDICATIONS:  Current Outpatient Medications  Medication Sig Dispense Refill   ACCU-CHEK AVIVA PLUS test strip CHECK BL00D SUGAR ONCE OR TWICE DAILY. 100 each PRN   calcium carbonate (OS-CAL - DOSED IN MG OF ELEMENTAL CALCIUM) 1250 (500 Ca) MG tablet Take 1 tablet by mouth.     carvedilol (COREG) 25 MG tablet Take 1 tablet (25 mg total) by mouth 2 (two) times daily with a meal. 180 tablet 3   Cholecalciferol (VITAMIN D3) 125 MCG (5000 UT) CAPS Take 5,000 Units by mouth daily.     fentaNYL (DURAGESIC) 12 MCG/HR Place 1 patch onto the skin every 3 (three) days. 5 patch 0   furosemide (LASIX) 40 MG tablet Take 1 tablet (40 mg total) by mouth daily. 30 tablet 3   glipiZIDE (GLUCOTROL) 5 MG  tablet Take 5 mg by mouth daily. Tablet(s) By Mouth Daily     levothyroxine (SYNTHROID) 25 MCG tablet Take 1 tablet (25 mcg total) by mouth daily before breakfast. 30 tablet 3   loperamide (IMODIUM A-D) 2 MG tablet Take 2 mg by mouth 4 (four) times daily as needed for diarrhea or loose stools.     metFORMIN (GLUCOPHAGE) 500 MG tablet Take 1,000 mg by mouth 2 (two) times daily. 2 Tablet(s) By Mouth Twice Daily     pravastatin (PRAVACHOL) 20 MG tablet Take 1 tablet (20 mg total) by mouth every evening. 30 tablet 11  prochlorperazine (COMPAZINE) 10 MG tablet Take 1 tablet (10 mg total) by mouth every 6 (six) hours as needed for nausea or vomiting. 30 tablet 0   promethazine (PHENERGAN) 25 MG tablet Take 1 tablet (25 mg total) by mouth every 6 (six) hours as needed for nausea or vomiting. 30 tablet 0   rifaximin (XIFAXAN) 550 MG TABS tablet Take 1 tablet (550 mg total) by mouth 2 (two) times daily. 60 tablet 11   spironolactone (ALDACTONE) 100 MG tablet Take 1 tablet (100 mg total) by mouth daily. 30 tablet 1   spironolactone (ALDACTONE) 100 MG tablet Take 1 tablet (100 mg total) by mouth daily. 30 tablet 3   vitamin B-12 (CYANOCOBALAMIN) 1000 MCG tablet Take 1 tablet (1,000 mcg total) by mouth daily. 90 tablet 0   Current Facility-Administered Medications  Medication Dose Route Frequency Provider Last Rate Last Admin   albumin human 25 % solution 25 g  25 g Intravenous Once [name], MD       Facility-Administered Medications Ordered in Other Visits  Medication Dose Route Frequency Provider Last Rate Last Admin   sodium chloride flush (NS) 0.9 % injection 10 mL  10 mL Intravenous PRN Earlie Server, MD   10 mL at 01/22/20 0831     PHYSICAL EXAMINATION: ECOG PERFORMANCE STATUS: 1 - Symptomatic but completely ambulatory Vitals:   09/01/21 0936  BP: 109/70  Pulse: 73  Temp: (!) 96.3 F (35.7 C)   Filed Weights   09/01/21 0936  Weight: 217 lb 8 oz (98.7 kg)    Physical  Exam Constitutional:      General: He is not in acute distress.    Appearance: He is obese.  HENT:     Head: Normocephalic and atraumatic.  Eyes:     General: No scleral icterus. Cardiovascular:     Rate and Rhythm: Normal rate and regular rhythm.     Heart sounds: Normal heart sounds.  Pulmonary:     Effort: Pulmonary effort is normal. No respiratory distress.     Breath sounds: No wheezing.  Abdominal:     General: Bowel sounds are normal. There is no distension.     Palpations: Abdomen is soft.  Musculoskeletal:        General: No deformity. Normal range of motion.     Cervical back: Normal range of motion and neck supple.  Skin:    General: Skin is warm and dry.     Findings: No erythema or rash.  Neurological:     Mental Status: He is alert and oriented to person, place, and time. Mental status is at baseline.     Cranial Nerves: No cranial nerve deficit.     Coordination: Coordination normal.  Psychiatric:        Mood and Affect: Mood normal.    LABORATORY DATA:  I have reviewed the data as listed Lab Results  Component Value Date   WBC 2.6 (L) 09/01/2021   HGB 10.7 (L) 09/01/2021   HCT 30.4 (L) 09/01/2021   MCV 91.6 09/01/2021   PLT 56 (L) 09/01/2021   Recent Labs    04/14/21 1656 04/15/21 1118 05/11/21 0932 05/12/21 0807 06/16/21 0941 09/01/21 0923  NA 132*   < > 136 132* 130* 130*  K 5.1   < > 4.5 3.9 4.2 3.7  CL 105   < > 101 100 97* 99  CO2 21*   < > 18* 24 21* 25  GLUCOSE 140*   < > 146* 138*  185* 159*  BUN 32*   < > 34* 39* 83* 50*  CREATININE 1.75*   < > 1.75* 1.83* 2.28* 2.09*  CALCIUM 8.5*   < > 8.7 8.4* 8.7* 9.2  GFRNONAA 45*   < >  --  42* 32* 36*  PROT 6.4*  --  7.0 7.1  --  7.8  ALBUMIN 2.9*  --  3.7* 3.2*  --  4.0  AST 36  --  52* 41  --  36  ALT 23  --  36 31  --  24  ALKPHOS 86  --  196* 136*  --  116  BILITOT 1.0  --  0.6 0.8  --  0.9  BILIDIR 0.2  --   --   --   --   --   IBILI 0.8  --   --   --   --   --    < > = values in  this interval not displayed.    Iron/TIBC/Ferritin/ %Sat    Component Value Date/Time   IRON 60 09/02/2020 0822   IRON 105 10/15/2018 1325   TIBC 293 09/02/2020 0822   TIBC 244 (L) 10/15/2018 1325   FERRITIN 164 09/02/2020 0822   FERRITIN 588 (H) 10/15/2018 1325   IRONPCTSAT 21 09/02/2020 0822   IRONPCTSAT 43 10/15/2018 1325      RADIOGRAPHIC STUDIES: I have personally reviewed the radiological images as listed and agreed with the findings in the report. No results found.    ASSESSMENT & PLAN:  1. Renal cell carcinoma of left kidney (HCC)   2. Chronic kidney disease, stage 3b ([place])   3. Cirrhosis of liver with ascites, unspecified hepatic cirrhosis type ([place])   4. Thrombocytopenia (HCC)   5. 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 Immunotherapy induced diarrhea has completely resolved. Recommend to repeat CT chest abdomen pelvis without contrast.   Consider resuming Keytruda after CT scan.  We discussed with patient about a possible recurrent immunotherapy induced diarrhea and if that is the case, will discontinue Keytruda.  He agrees with the plan. Other options,VGEF  inhibitors when disease progresses.  # T9 lesion, s/p resection and spine fusion.  Pathology is positive for RCC. It has been +8 weeks since his surgery. Last Xgeva 06/16/2021.    #  liver cirrhosis, recurrent ascites, symptoms have improved.  Continue follow-up with GI.  Continue diuretics.  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  # Elevated TSH, normal T4 level.  Patient feels very fatigued. 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  Continue low-dose Synthroid supplementation. Repeat TSH, free T4 at the next visit.   Follow-up in 2 to 3 weeks.  All questions were answered. The patient  knows to call the clinic with any problems questions or concerns.   Earlie Server, MD, PhD 09/01/2021

## 2021-09-01 NOTE — Progress Notes (Signed)
Henry Moore  [phone] [phone]   Name: Henry Moore Date: 09/01/2021 MRN: [id number]  DOB: [birth year]  Patient Care Team: Albina Billet, MD as PCP - General (Internal Medicine) Earlie Server, MD as Consulting Physician (Hematology and Oncology)    REASON FOR CONSULTATION: Henry Moore is a 62 y.o. 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  patient was referred to palliative care to help address goals and manage ongoing symptoms.  SOCIAL HISTORY:     reports that he quit smoking about 8 years ago. His smoking use included cigarettes. He has a 5.00 pack-year smoking history. He has never used smokeless tobacco. He reports that he does not currently use alcohol. He reports that he does not use drugs.   Patient lives at home with his wife, son, and daughter.  Both children are in their 65s.  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  He was disabled due to a back injury.  ADVANCE DIRECTIVES:  Does not have  CODE STATUS:   PAST MEDICAL HISTORY: Past Medical History:  Diagnosis Date   Cough    SINUS DRAINAGE CAUSING COUGHING , REPORTS CLEAR MUCOUS    Diabetes mellitus without complication ([place])    Family history of breast cancer    Family history of colon cancer    Family history of stomach cancer    HTN (hypertension)    Hyperlipemia    Left renal mass    Platelets decreased ([place])    DENIES UNSUAL BLEEDING    PONV (postoperative nausea and vomiting)    Renal cell carcinoma ([place]) 08/04/2019   WBC decreased     PAST SURGICAL HISTORY:  Past Surgical History:  Procedure Laterality Date   ANKLE SURGERY Left    ANTERIOR CERVICAL DECOMP/DISCECTOMY FUSION N/A 04/16/2014   Procedure: ANTERIOR CERVICAL DECOMPRESSION/DISCECTOMY FUSION  (ACDF C5-C7)   (2 LEVELS)     ;   Surgeon: Melina Schools, MD;  Location: [facility];  Service: Orthopedics;  Laterality: N/A;   APPENDECTOMY     BACK SURGERY     ESOPHAGOGASTRODUODENOSCOPY (EGD) WITH PROPOFOL N/A 02/06/2019   Procedure: ESOPHAGOGASTRODUODENOSCOPY (EGD) WITH PROPOFOL;  Surgeon: [name], MD;  Location[facility] ENDOSCOPY;  Service: Gastroenterology;  Laterality: N/A;   ESOPHAGOGASTRODUODENOSCOPY (EGD) WITH PROPOFOL N/A 03/04/2019   Procedure: ESOPHAGOGASTRODUODENOSCOPY (EGD) WITH PROPOFOL;  Surgeon: Lin Landsman, MD;  Location: [facility];  Service: Gastroenterology;  Laterality: N/A;   ESOPHAGOGASTRODUODENOSCOPY (EGD) WITH PROPOFOL N/A 04/22/2019   Procedure: ESOPHAGOGASTRODUODENOSCOPY (EGD) WITH PROPOFOL;  Surgeon: [name], MD;  Location[facility] ENDOSCOPY;  Service: Gastroenterology;  Laterality: N/A;   FRACTURE SURGERY     HYDROCELE EXCISION     KNEE ARTHROSCOPY Bilateral    LAPAROSCOPIC NEPHRECTOMY, HAND ASSISTED Left 10/16/2018   Procedure: LEFT HAND ASSISTED LAPAROSCOPIC NEPHRECTOMY;  Surgeon: Lucas Mallow, MD;  Location: [facility];  Service: Urology;  Laterality: Left;   NASAL SINUS SURGERY     X 2   PENILE PROSTHESIS IMPLANT  10 YEARS AGO   PORTA CATH INSERTION N/A 11/11/2019   Procedure: PORTA CATH INSERTION;  Surgeon: Katha Cabal, MD;  Location: [facility];  Service: Cardiovascular;  Laterality: N/A;   SHOULDER SURGERY Left     HEMATOLOGY/ONCOLOGY HISTORY:  Oncology History  Renal cell carcinoma ([place])  07/14/2019 Cancer Staging  Staging form: Kidney, AJCC 8th Edition - Clinical stage from 07/14/2019: Stage IV (cTX, cNX, cM1) - Signed by Earlie Server, MD on 08/05/2020   08/04/2019 Initial Diagnosis   Renal cell carcinoma ([place])   08/14/2019 -  Chemotherapy   Patient is on Treatment Plan : RENAL CELL CARCINOMA Nivolumab / Ipilimumab Q21D     10/05/2019 Genetic Testing   Negative genetic testing. No pathogenic variants identified on the [facility] Multi-Cancer Panel. VUS in [place]  called  c.4922T>C (p.Leu1641Pro) identified. The report date is 10/05/2019.  The Multi-Cancer Panel offered by [facility] includes sequencing and/or deletion duplication testing of the following 85 genes: AIP, ALK, APC, ATM, AXIN2,BAP1,  BARD1, BLM, BMPR1A, BRCA1, BRCA2, BRIP1, CASR, CDC73, CDH1, CDK4, CDKN1B, CDKN1C, CDKN2A (p14ARF), CDKN2A (p16INK4a), CEBPA, CHEK2, CTNNA1, DICER1, DIS3L2, EGFR (c.2369C>T, p.Thr790Met variant only), EPCAM (Deletion/duplication testing only), FH, FLCN, GATA2, GPC3, GREM1 (Promoter region deletion/duplication testing only), HOXB13 (c.251G>A, p.Gly84Glu), HRAS, KIT, MAX, MEN1, MET, MITF (c.952G>A, p.Glu318Lys variant only), MLH1, MSH2, MSH3, MSH6, MUTYH, NBN, NF1, NF2, NTHL1, PALB2, PDGFRA, PHOX2B, PMS2, POLD1, POLE, POT1, PRKAR1A, PTCH1, PTEN, RAD50, RAD51C, RAD51D, RB1, RECQL4, RET, RNF43, RUNX1, SDHAF2, SDHA (sequence changes only), SDHB, SDHC, SDHD, SMAD4, SMARCA4, SMARCB1, SMARCE1, STK11, SUFU, TERC, TERT, TMEM127, TP53, TSC1, TSC2, VHL, WRN and WT1.      ALLERGIES:  is allergic to codeine and mucinex [guaifenesin er].  MEDICATIONS:  Current Outpatient Medications  Medication Sig Dispense Refill   ACCU-CHEK AVIVA PLUS test strip CHECK BL00D SUGAR ONCE OR TWICE DAILY. 100 each PRN   calcium carbonate (OS-CAL - DOSED IN MG OF ELEMENTAL CALCIUM) 1250 (500 Ca) MG tablet Take 1 tablet by mouth.     carvedilol (COREG) 25 MG tablet Take 1 tablet (25 mg total) by mouth 2 (two) times daily with a meal. 180 tablet 3   Cholecalciferol (VITAMIN D3) 125 MCG (5000 UT) CAPS Take 5,000 Units by mouth daily.     fentaNYL (DURAGESIC) 12 MCG/HR Place 1 patch onto the skin every 3 (three) days. 5 patch 0   furosemide (LASIX) 40 MG tablet Take 1 tablet (40 mg total) by mouth daily. 30 tablet 3   glipiZIDE (GLUCOTROL) 5 MG tablet Take 5 mg by mouth daily. Tablet(s) By Mouth Daily     levothyroxine (SYNTHROID) 25 MCG tablet Take 1 tablet (25 mcg total) by mouth daily before breakfast. 30  tablet 3   loperamide (IMODIUM A-D) 2 MG tablet Take 2 mg by mouth 4 (four) times daily as needed for diarrhea or loose stools.     metFORMIN (GLUCOPHAGE) 500 MG tablet Take 1,000 mg by mouth 2 (two) times daily. 2 Tablet(s) By Mouth Twice Daily     pravastatin (PRAVACHOL) 20 MG tablet Take 1 tablet (20 mg total) by mouth every evening. 30 tablet 11   prochlorperazine (COMPAZINE) 10 MG tablet Take 1 tablet (10 mg total) by mouth every 6 (six) hours as needed for nausea or vomiting. 30 tablet 0   promethazine (PHENERGAN) 25 MG tablet Take 1 tablet (25 mg total) by mouth every 6 (six) hours as needed for nausea or vomiting. 30 tablet 0   rifaximin (XIFAXAN) 550 MG TABS tablet Take 1 tablet (550 mg total) by mouth 2 (two) times daily. 60 tablet 11   spironolactone (ALDACTONE) 100 MG tablet Take 1 tablet (100 mg total) by mouth daily. 30 tablet 1   spironolactone (ALDACTONE) 100 MG tablet Take 1 tablet (100 mg total) by mouth daily. 30 tablet 3   vitamin B-12 (CYANOCOBALAMIN)  1000 MCG tablet Take 1 tablet (1,000 mcg total) by mouth daily. 90 tablet 0   Current Facility-Administered Medications  Medication Dose Route Frequency Provider Last Rate Last Admin   albumin human 25 % solution 25 g  25 g Intravenous Once [name], MD       Facility-Administered Medications Ordered in Other Visits  Medication Dose Route Frequency Provider Last Rate Last Admin   sodium chloride flush (NS) 0.9 % injection 10 mL  10 mL Intravenous PRN Earlie Server, MD   10 mL at 01/22/20 0831    VITAL SIGNS: There were no vitals taken for this visit. There were no vitals filed for this visit.  Estimated body mass index is 28.7 kg/m as calculated from the following:   Height as of 06/01/21: _0  (1.854 m).   Weight as of an earlier encounter on 09/01/21: 217 lb 8 oz (98.7 kg).  LABS: CBC:    Component Value Date/Time   WBC 2.6 (L) 09/01/2021 0923   HGB 10.7 (L) 09/01/2021 0923   HGB 11.1 (L) 05/11/2021 0932   HCT 30.4  (L) 09/01/2021 0923   HCT 34.1 (L) 05/11/2021 0932   PLT 56 (L) 09/01/2021 0923   PLT 145 (L) 05/11/2021 0932   MCV 91.6 09/01/2021 0923   MCV 93 05/11/2021 0932   NEUTROABS 1.9 09/01/2021 0923   NEUTROABS 3.2 05/11/2021 0932   LYMPHSABS 0.3 (L) 09/01/2021 0923   LYMPHSABS 0.4 (L) 05/11/2021 0932   MONOABS 0.3 09/01/2021 0923   EOSABS 0.1 09/01/2021 0923   EOSABS 0.3 05/11/2021 0932   BASOSABS 0.0 09/01/2021 0923   BASOSABS 0.1 05/11/2021 0932   Comprehensive Metabolic Panel:    Component Value Date/Time   NA 130 (L) 06/16/2021 0941   NA 136 05/11/2021 0932   K 4.2 06/16/2021 0941   CL 97 (L) 06/16/2021 0941   CO2 21 (L) 06/16/2021 0941   BUN 83 (H) 06/16/2021 0941   BUN 34 (H) 05/11/2021 0932   CREATININE 2.28 (H) 06/16/2021 0941   GLUCOSE 185 (H) 06/16/2021 0941   CALCIUM 8.7 (L) 06/16/2021 0941   CALCIUM 8.9 05/12/2019 1121   AST 41 05/12/2021 0807   AST 41 (H) 06/18/2018 1111   ALT 31 05/12/2021 0807   ALT 33 06/18/2018 1111   ALKPHOS 136 (H) 05/12/2021 0807   BILITOT 0.8 05/12/2021 0807   BILITOT 0.6 05/11/2021 0932   PROT 7.1 05/12/2021 0807   PROT 7.0 05/11/2021 0932   ALBUMIN 3.2 (L) 05/12/2021 0807   ALBUMIN 3.7 (L) 05/11/2021 0932    RADIOGRAPHIC STUDIES: No results found.   PERFORMANCE STATUS (ECOG) : 1 - Symptomatic but completely ambulatory  Review of Systems Unless otherwise noted, a complete review of systems is negative.  Physical Exam General: NAD Pulmonary: Unlabored Extremities: no edema, no joint deformities Skin: no rashes Neurological: Grossly nonfocal  IMPRESSION: Routine follow-up.  Patient reports he is doing well.  No significant changes or symptomatic concerns today.  Patient does continue to have chronic pain but this is improved and patient is not currently on opioid pain medications.  Patient reports good appetite and stable performance status.    He has not required recent paracentesis.  PLAN: -Continue current scope of  treatment -He will benefit from readdressing ACP -Follow-up telephone visit 2 months  Case and plan discussed with Dr. Tasia Catchings   Patient expressed understanding and was in agreement with this plan. He also understands that He can call the clinic at any time with  any questions, concerns, or complaints.     Time Total: 10 minutes  Visit consisted of counseling and education dealing with the complex and emotionally intense issues of symptom management and palliative care in the setting of serious and potentially life-threatening illness.Greater than 50%  of this time was spent counseling and coordinating care related to the above assessment and plan.  Signed by: Altha Harm, PhD, NP-C

## 2021-09-14 ENCOUNTER — Other Ambulatory Visit: Payer: Self-pay

## 2021-09-14 ENCOUNTER — Other Ambulatory Visit: Payer: Self-pay | Admitting: *Deleted

## 2021-09-14 ENCOUNTER — Ambulatory Visit
Admission: RE | Admit: 2021-09-14 | Discharge: 2021-09-14 | Disposition: A | Discharge status: Home / Self Care | Payer: Medicare Other | Source: Ambulatory Visit | Attending: Oncology | Admitting: Oncology

## 2021-09-14 DIAGNOSIS — C642 Malignant neoplasm of left kidney, except renal pelvis: Secondary | ICD-10-CM | POA: Diagnosis present

## 2021-09-15 ENCOUNTER — Encounter: Payer: Self-pay | Admitting: Radiation Oncology

## 2021-09-15 ENCOUNTER — Ambulatory Visit
Admission: RE | Admit: 2021-09-15 | Discharge: 2021-09-15 | Disposition: A | Discharge status: Home / Self Care | Payer: BC Managed Care – PPO | Source: Ambulatory Visit | Attending: Radiation Oncology | Admitting: Radiation Oncology

## 2021-09-15 VITALS — BP 120/70 | HR 75 | Temp 96.3°F | Resp 18 | Ht 73.0 in

## 2021-09-15 DIAGNOSIS — C7951 Secondary malignant neoplasm of bone: Secondary | ICD-10-CM | POA: Insufficient documentation

## 2021-09-15 DIAGNOSIS — R918 Other nonspecific abnormal finding of lung field: Secondary | ICD-10-CM | POA: Diagnosis not present

## 2021-09-15 DIAGNOSIS — Z923 Personal history of irradiation: Secondary | ICD-10-CM | POA: Insufficient documentation

## 2021-09-15 DIAGNOSIS — C642 Malignant neoplasm of left kidney, except renal pelvis: Secondary | ICD-10-CM | POA: Insufficient documentation

## 2021-09-15 NOTE — Progress Notes (Signed)
Radiation Oncology Follow up Note  Name: Henry Moore   Date:   09/15/2021 MRN:  [id number] DOB: 01-09-1961    This 61 y.o. male presents to the clinic today for 1 month follow-up status post SBRT to left upper lobe lung nodule and patient with known stage IV renal cell carcinoma previous treated to T9 vertebral body.  REFERRING PROVIDER: Albina Billet, MD  HPI: Patient is a 61 year old male now at 1 month having completed SBRT to his left upper lobe for metastatic renal cell carcinoma.  He had previously been treated to his T9 vertebral body for metastatic disease.  Seen today in routine follow-up he is doing well.  Specifically [place] cough hemoptysis chest tightness or any change in pulmonary pattern.  He did have a recent CT scan.  Showing slightly enlarged 0.8 cm subpleural nodule in the left lower lobe previously 0.5 cm.  Lesion in the left upper lobe appears unchanged if not slightly smaller.  I believe he is being considered for resumption of his Keytruda.  COMPLICATIONS OF TREATMENT: none  FOLLOW UP COMPLIANCE: keeps appointments   PHYSICAL EXAM:  BP 120/70    Pulse 75    Temp (!) 96.3 F (35.7 C)    Resp 18    Ht 6\' 1"  (1.854 m)    BMI 28.70 kg/m  Well-developed well-nourished patient in NAD. HEENT reveals PERLA, EOMI, discs not visualized.  Oral cavity is clear. No oral mucosal lesions are identified. Neck is clear without evidence of cervical or supraclavicular adenopathy. Lungs are clear to A&P. Cardiac examination is essentially unremarkable with regular rate and rhythm without murmur rub or thrill. Abdomen is benign with no organomegaly or masses noted. Motor sensory and DTR levels are equal and symmetric in the upper and lower extremities. Cranial nerves II through XII are grossly intact. Proprioception is intact. No peripheral adenopathy or edema is identified. No motor or sensory levels are noted. Crude visual fields are within normal range.  RADIOLOGY RESULTS: CT scan reviewed  compatible with above-stated findings  PLAN: Present time he is doing well not sure what to make of slight increase in size of left lower lobe subpleural nodule although we will continue to surveilled that.  I have asked to see him back in 3 to 4 months for follow-up.  Should follow-up CT scan show progression of the lesion could always do SBRT to that.  Patient continues close follow-up care and I believe resuming Keytruda with medical oncology.  Patient knows to call with any concerns.  I would like to take this opportunity to thank you for allowing me to participate in the care of your patient.Noreene Filbert, MD

## 2021-09-19 ENCOUNTER — Other Ambulatory Visit: Payer: Self-pay

## 2021-09-19 ENCOUNTER — Encounter: Payer: Self-pay | Admitting: Oncology

## 2021-09-19 ENCOUNTER — Inpatient Hospital Stay: Payer: Medicare Other

## 2021-09-19 ENCOUNTER — Inpatient Hospital Stay (HOSPITAL_BASED_OUTPATIENT_CLINIC_OR_DEPARTMENT_OTHER): Payer: Medicare Other | Admitting: Oncology

## 2021-09-19 VITALS — BP 116/62 | HR 71 | Temp 96.6°F | Resp 18 | Wt 219.0 lb

## 2021-09-19 VITALS — BP 117/62 | HR 68

## 2021-09-19 DIAGNOSIS — R188 Other ascites: Secondary | ICD-10-CM

## 2021-09-19 DIAGNOSIS — R7989 Other specified abnormal findings of blood chemistry: Secondary | ICD-10-CM

## 2021-09-19 DIAGNOSIS — C642 Malignant neoplasm of left kidney, except renal pelvis: Secondary | ICD-10-CM

## 2021-09-19 DIAGNOSIS — N1832 Chronic kidney disease, stage 3b: Secondary | ICD-10-CM | POA: Diagnosis not present

## 2021-09-19 DIAGNOSIS — C649 Malignant neoplasm of unspecified kidney, except renal pelvis: Secondary | ICD-10-CM

## 2021-09-19 DIAGNOSIS — K7460 Unspecified cirrhosis of liver: Secondary | ICD-10-CM

## 2021-09-19 DIAGNOSIS — Z5112 Encounter for antineoplastic immunotherapy: Secondary | ICD-10-CM

## 2021-09-19 DIAGNOSIS — D696 Thrombocytopenia, unspecified: Secondary | ICD-10-CM

## 2021-09-19 LAB — COMPREHENSIVE METABOLIC PANEL
ALT: 36 U/L (ref 0–44)
AST: 45 U/L — ABNORMAL HIGH (ref 15–41)
Albumin: 3.9 g/dL (ref 3.5–5.0)
Alkaline Phosphatase: 145 U/L — ABNORMAL HIGH (ref 38–126)
Anion gap: 7 (ref 5–15)
BUN: 53 mg/dL — ABNORMAL HIGH (ref 6–20)
CO2: 23 mmol/L (ref 22–32)
Calcium: 9.4 mg/dL (ref 8.9–10.3)
Chloride: 103 mmol/L (ref 98–111)
Creatinine, Ser: 2.12 mg/dL — ABNORMAL HIGH (ref 0.61–1.24)
GFR, Estimated: 35 mL/min — ABNORMAL LOW (ref >60)
Glucose, Bld: 155 mg/dL — ABNORMAL HIGH (ref 70–99)
Potassium: 4.4 mmol/L (ref 3.5–5.1)
Sodium: 133 mmol/L — ABNORMAL LOW (ref 135–145)
Total Bilirubin: 0.5 mg/dL (ref 0.3–1.2)
Total Protein: 7.8 g/dL (ref 6.5–8.1)

## 2021-09-19 LAB — CBC WITH DIFFERENTIAL/PLATELET
Abs Immature Granulocytes: 0.03 10*3/uL (ref 0.00–0.07)
Basophils Absolute: 0 10*3/uL (ref 0.0–0.1)
Basophils Relative: 1 %
Eosinophils Absolute: 0.1 10*3/uL (ref 0.0–0.5)
Eosinophils Relative: 4 %
HCT: 29.9 % — ABNORMAL LOW (ref 39.0–52.0)
Hemoglobin: 10.5 g/dL — ABNORMAL LOW (ref 13.0–17.0)
Immature Granulocytes: 1 %
Lymphocytes Relative: 11 %
Lymphs Abs: 0.3 10*3/uL — ABNORMAL LOW (ref 0.7–4.0)
MCH: 32.7 pg (ref 26.0–34.0)
MCHC: 35.1 g/dL (ref 30.0–36.0)
MCV: 93.1 fL (ref 80.0–100.0)
Monocytes Absolute: 0.4 10*3/uL (ref 0.1–1.0)
Monocytes Relative: 15 %
Neutro Abs: 1.7 10*3/uL (ref 1.7–7.7)
Neutrophils Relative %: 68 %
Platelets: 60 10*3/uL — ABNORMAL LOW (ref 150–400)
RBC: 3.21 MIL/uL — ABNORMAL LOW (ref 4.22–5.81)
RDW: 13.8 % (ref 11.5–15.5)
WBC: 2.5 10*3/uL — ABNORMAL LOW (ref 4.0–10.5)
nRBC: 0 % (ref 0.0–0.2)

## 2021-09-19 LAB — TSH: TSH: 3.164 u[IU]/mL (ref 0.350–4.500)

## 2021-09-19 LAB — T4, FREE: Free T4: 1.18 ng/dL — ABNORMAL HIGH (ref 0.61–1.12)

## 2021-09-19 MED ORDER — SODIUM CHLORIDE 0.9 % IV SOLN
Freq: Once | INTRAVENOUS | Status: AC
  Filled 2021-09-19: qty 250

## 2021-09-19 MED ORDER — HEPARIN SOD (PORK) LOCK FLUSH 100 UNIT/ML IV SOLN
500.0000 [IU] | Freq: Once | INTRAVENOUS | Status: AC | PRN
  Administered 2021-09-19: 500 [IU]
  Filled 2021-09-19: qty 5

## 2021-09-19 MED ORDER — SODIUM CHLORIDE 0.9 % IV SOLN
240.0000 mg | Freq: Once | INTRAVENOUS | Status: AC
  Administered 2021-09-19: 240 mg via INTRAVENOUS
  Filled 2021-09-19: qty 24

## 2021-09-19 NOTE — Progress Notes (Signed)
Hematology/Oncology follow-up [phone]    Patient Care Team: Albina Billet, MD as PCP - General (Internal Medicine) Earlie Server, MD as Consulting Physician (Hematology and Oncology)  REFERRING PROVIDER: Albina Billet, MD  CHIEF COMPLAINTS/REASON FOR VISIT:  Follow-up for kidney cancer treatments  HISTORY OF PRESENTING ILLNESS:   Henry Moore is a  61 y.o.  male with PMH listed below was seen in consultation at the request of  Albina Billet, MD  for evaluation of kidney cancer Extensive medical records were reviewed Patient had MRI lumbar spine without contrast done on 07/13/2018 which showed mired lumbar degenerative disease. CT thoracic spine was done on 08/05/2018 which showed 5 cm left renal mass. 09/04/2018 CT abdomen and pelvis with and without contrast showed 5 cm round enhancing solid mass in the left kidney.  No involvement of left renal vein.  No lymphadenopathy Morphologic changes consistent with cirrhosis and the portal hypertension.  Small volume ascites and splenomegaly. . 10/16/2018 Left nephrectomy showed renal cell carcinoma, clear-cell type, nuclear grade 4, tumor extends into the renal vein and renal sinus fat.  Urethral, vascular and or resection margins are negative for tumor pT3a Nx Mx  01/28/2019 patient had another CT chest abdomen pelvis done with contrast Showed interval left nephrectomy.  Scattered tiny pulmonary nodules bilaterally.  Nonspecific.  No other evidence of metastatic disease.  Cirrhosis changes extensive coronary and aortic atherosclerosis  07/14/2019 patient underwent surveillance CT chest abdomen pelvis without contrast Interval progression of pulmonary metastasis with new enlarged right paratracheal lymph node 1.7 cm, previously 0.6 cm one-point since worrisome for metastatic adenopathy.  Multiple progressive pulmonary nodules, index nodule within the lingula measures 1.3 cm, previously 3 mm. Index subpleural nodule within the anterior  right upper lobe measuring 1.8 cm, previously 3 cm Index nodule within the post lateral right lower lobe measuring 5 mm, new from previous care Aortic sclerosis.  Three-vessel coronary artery calcification noted.  Cirrhosis changes.  Splenomegaly.  Patient was referred to establish care with medical oncology for further discussion and evaluation of metastatic renal cell carcinoma. Patient reports feeling well at baseline today.  Denies any shortness of breath, cough, hemoptysis, back pain, headache. He quitted smoking 2015.  Not currently actively drinking alcohol as well. Patient has chronic back pain unchanged.  #08/14/2019-10/16/2019 nivolumab and ipilimumab. #11/06/2019-nivolumab every 2 weeks maintenance.  # Liver cirrhosis with small amount of ascites/splenomegaly patient sees gastroenterology Dr. [name].  . He will get ultrasound surveillance due in February 2021 for screening of [place].  EGD surveillance for Barrett's plan in April 2021.  # Diabetes, on Humalog sliding scale and metformin.  #Family history of cancer and personal history of RCC.  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  Status post ipilimumab and nivolumab treatment.  # 10/29/2019 CT chest abdomen pelvis without contrast images were independently reviewed by me and discussed with patient.   CT showed marked improvement with resolution of many of the pulmonary nodules, prominent reduced size of other nodules.  Considerably reduced size of the right lower paratracheal lymph node now 0.9 cm in diameter. Hepatic cirrhosis with prominent splenomegaly and trace perisplenic ascites. Chronic CT findings includes aortic atherosclerotic vascular disease.  Mild sigmoid colon diverticulosis  # CKD,  he establish care with Dr. Candiss Norse  for chronic kidney disease.  Blood pressure medication has been adjusted.  Hydrochlorothiazide decreased to 12.5 mg daily.  # NGS -foundation one PD-L1 TPS 0% no reportable mutation, MS stable. TMB 8 muts/mb VHL [id number]*16, SETD2 BAP Genetic testing is negative.   # 07/16/2020, CT chest abdomen pelvis images were reviewed and discussed with patient Compared to July 2021 image, he has had a new patchy groundglass opacities throughout both lungs with central part of solids/airspace components.  Findings are nonspecific and could be secondary to an atypical infection, including viral pneumonia or inflammation.  Patient is asymptomatic.  I wonder the changes are secondary to his Covid infection.  Patient has been started on Levaquin to cover atypical infection by on-call physician and I recommend him to continue and complete the course. Proceed with today's immunotherapy nivolumab.  Advised patient to notify me if his symptoms get worse.  # 10/12/2020 CT chest abdomen pelvis with out contrast. New 15 mm lytic lesion in the right T9 vertebral body.  Suspicious for isolated lytic metastasis.  No findings of suspicious soft tissue metastasis in the chest abdomen or pelvis.  Residual postinfectious inflammatory scarring in the lung bilaterally.  Cirrhosis.  Splenomegaly.  Minimal ascites.  # 10/26/20 Bone scan showed Minimal increased activity noted at the right costovertebral junction at the T9 level is most likely degenerative. Similar finding noted on prior bone scan of 08/11/2019. No definite T9 vertebral body abnormal activity corresponding to lytic lesion noted on recent CT. Again the recently identified T9 vertebral body lytic lesion is suspicious and further evaluation of the thoracic spine with MRI can be obtained. No other bony abnormalities noted by bone scan. I discussed patient's case on tumor board 11/04/2020.  Bone scan probably is not sensitive in detecting lytic  lesion from [place]  Consensus reached upon clinically based on the CT scan, new bone lesion is highly likely metastasis from RDelta  Options including proceed with bone lesion biopsy versus referral to radiation oncology for empiric palliative radiation.  Patient has other comorbidities including cirrhosis/pancytopenia, patient opts to proceed with empiric palliative radiation.  12/13/2020 finished RT to T9 June 2022 abscess of anal area , s/ drainage and antibiotics. July 2022, immunotherapy was held due to diarrhea.  02/01/2021  mild wall thickening of aseconding colon. Small infraumbilical ventral hernia,contains short segment of small bowel. Cirrohsis and splenomegaly. Small volume ascites, slight increase 02/03/21 chest wo contrast showed  Lytic lesion within T9 slightly increased in size., no new skeletal metastasis. Mild peripheral ground glass densities.   03/18/2021, resumed on nivolumab treatment x1. 04/01/2021, hold nivolumab due to diarrhea and upcoming surgery. # 04/08/2021 paracentesis. Cytology negative for malignancy.  # [facility]14[facility]2021[facility] S/p paracentesis on 04/15/2021 , cytology negative.  # 04/22/21 right T9 transpedicular mass resection. T7-11 [facility] 05[facility]06[facility]2021 abdominal distention. S/p repeat therapeutic paracentesis.   05/30/2021, CT chest abdomen pelvis showed 8 mm left upper lobe pulmonary nodule, increased size compared to 2 mm previously.  Interval progression highly concerning for metastatic disease.  Primary bronchogenic neoplasm is also another consideration. Interval thoracic fusion above and below the T10.  Previously described tiny lucency inferior L3 vertebral body is stable.  Cirrhosis with splenomegaly and small volume ascites.  Cholelithiasis.  Aortic atherosclerosis  #08/12/2021, status post stereotactic radiation to lung  INTERVAL HISTORY Henry NOLDENis a 61y.o. male who has above  history reviewed by me today presents for  follow up visit for management of stage IV RCC Patient reports feeling well today.  No new complaints Denies any nausea vomiting diarrhea   Review of Systems  Constitutional:  Positive for fatigue. Negative for appetite change, chills, fever and unexpected weight change.  HENT:   Negative for hearing loss and voice change.   Eyes:  Negative for eye problems and icterus.  Respiratory:  Negative for chest tightness, cough and shortness of breath.   Cardiovascular:  Negative for chest pain and leg swelling.  Gastrointestinal:  Negative for abdominal distention, abdominal pain, diarrhea, nausea and vomiting.  Endocrine: Negative for hot flashes.  Genitourinary:  Negative for difficulty urinating, dysuria and frequency.   Musculoskeletal:  Positive for arthralgias.       Chronic back pain  Skin:  Negative for itching and rash.       Hair loss  Neurological:  Negative for light-headedness and numbness.  Hematological:  Negative for adenopathy. Does not bruise/bleed easily.  Psychiatric/Behavioral:  Negative for confusion.    MEDICAL HISTORY:  Past Medical History:  Diagnosis Date   Cough    SINUS DRAINAGE CAUSING COUGHING , REPORTS CLEAR MUCOUS    Diabetes mellitus without complication (HCC)    Family history of breast cancer    Family history of colon cancer    Family history of stomach cancer    HTN (hypertension)    Hyperlipemia    Left renal mass    Platelets decreased ([place])    DENIES UNSUAL BLEEDING    PONV (postoperative nausea and vomiting)    Renal cell carcinoma ([place]) 08/04/2019   WBC decreased     SURGICAL HISTORY: Past Surgical History:  Procedure Laterality Date   ANKLE SURGERY Left    ANTERIOR CERVICAL DECOMP/DISCECTOMY FUSION N/A 04/16/2014   Procedure: ANTERIOR CERVICAL DECOMPRESSION/DISCECTOMY FUSION  (ACDF C5-C7)   (2 LEVELS)     ;  Surgeon: Melina Schools, MD;  Location: [place];  Service: Orthopedics;  Laterality: N/A;    APPENDECTOMY     BACK SURGERY     ESOPHAGOGASTRODUODENOSCOPY (EGD) WITH PROPOFOL N/A 02/06/2019   Procedure: ESOPHAGOGASTRODUODENOSCOPY (EGD) WITH PROPOFOL;  Surgeon: [name], MD;  Location: [facility] ENDOSCOPY;  Service: Gastroenterology;  Laterality: N/A;   ESOPHAGOGASTRODUODENOSCOPY (EGD) WITH PROPOFOL N/A 03/04/2019   Procedure: ESOPHAGOGASTRODUODENOSCOPY (EGD) WITH PROPOFOL;  Surgeon: Lin Landsman, MD;  Location: [place];  Service: Gastroenterology;  Laterality: N/A;   ESOPHAGOGASTRODUODENOSCOPY (EGD) WITH PROPOFOL N/A 04/22/2019   Procedure: ESOPHAGOGASTRODUODENOSCOPY (EGD) WITH PROPOFOL;  Surgeon: [name], MD;  Location: [facility] ENDOSCOPY;  Service: Gastroenterology;  Laterality: N/A;   FRACTURE SURGERY     HYDROCELE EXCISION     KNEE ARTHROSCOPY Bilateral    LAPAROSCOPIC NEPHRECTOMY, HAND ASSISTED Left 10/16/2018   Procedure: LEFT HAND ASSISTED LAPAROSCOPIC NEPHRECTOMY;  Surgeon: Lucas Mallow, MD;  Location: [facility];  Service: Urology;  Laterality: Left;   NASAL SINUS SURGERY     X 2   PENILE PROSTHESIS IMPLANT  10 YEARS AGO   PORTA CATH INSERTION N/A 11/11/2019   Procedure: PORTA CATH INSERTION;  Surgeon: Katha Cabal, MD;  Location: [place] CV LAB;  Service: Cardiovascular;  Laterality: N/A;   SHOULDER SURGERY Left     SOCIAL HISTORY: Social History   Socioeconomic History   Marital status: Married    Spouse name: Not on file   Number of children: Not on file   Years of education: Not on file   Highest education level: Not on file  Occupational  History   Not on file  Tobacco Use   Smoking status: Former    Packs/day: 0.25    Years: 20.00    Pack years: 5.00    Types: Cigarettes    Quit date: 2015    Years since quitting: 8.1   Smokeless tobacco: Never  Vaping Use   Vaping Use: Never used  Substance and Sexual Activity   Alcohol use: Not Currently    Alcohol/week: 0.0 standard drinks    Comment: no alchol in 1 year   Drug use: No    Sexual activity: Not on file  Other Topics Concern   Not on file  Social History Narrative   Not on file   Social Determinants of Health   Financial Resource Strain: Not on file  Food Insecurity: Not on file  Transportation Needs: Not on file  Physical Activity: Not on file  Stress: Not on file  Social Connections: Not on file  Intimate Partner Violence: Not on file    FAMILY HISTORY: Family History  Problem Relation Age of Onset   Diabetes Mother    Cancer Mother        neuroendocrine cancer   Cancer Father        neuroendocrine cancer of meninges   Cancer Maternal Grandmother        tomach dx 66   Alzheimer's disease Paternal Grandmother    Lung cancer Paternal Grandfather    Lung cancer Maternal Aunt    Colon cancer Maternal Uncle    Breast cancer Paternal Aunt        both dx 88s   Ovarian cancer Other        dx 30   Breast cancer Cousin     ALLERGIES:  is allergic to codeine and mucinex [guaifenesin er].  MEDICATIONS:  Current Outpatient Medications  Medication Sig Dispense Refill   ACCU-CHEK AVIVA PLUS test strip CHECK BL00D SUGAR ONCE OR TWICE DAILY. 100 each PRN   calcium carbonate (OS-CAL - DOSED IN MG OF ELEMENTAL CALCIUM) 1250 (500 Ca) MG tablet Take 1 tablet by mouth.     carvedilol (COREG) 25 MG tablet Take 1 tablet (25 mg total) by mouth 2 (two) times daily with a meal. 180 tablet 3   Cholecalciferol (VITAMIN D3) 125 MCG (5000 UT) CAPS Take 5,000 Units by mouth daily.     furosemide (LASIX) 40 MG tablet Take 1 tablet (40 mg total) by mouth daily. 30 tablet 3   glipiZIDE (GLUCOTROL) 5 MG tablet Take 5 mg by mouth daily. Tablet(s) By Mouth Daily     levothyroxine (SYNTHROID) 25 MCG tablet Take 1 tablet (25 mcg total) by mouth daily before breakfast. 30 tablet 3   loperamide (IMODIUM A-D) 2 MG tablet Take 2 mg by mouth 4 (four) times daily as needed for diarrhea or loose stools.     metFORMIN (GLUCOPHAGE) 500 MG tablet Take 1,000 mg by mouth 2 (two) times  daily. 2 Tablet(s) By Mouth Twice Daily     pravastatin (PRAVACHOL) 20 MG tablet Take 1 tablet (20 mg total) by mouth every evening. 30 tablet 11   prochlorperazine (COMPAZINE) 10 MG tablet Take 1 tablet (10 mg total) by mouth every 6 (six) hours as needed for nausea or vomiting. 30 tablet 0   promethazine (PHENERGAN) 25 MG tablet Take 1 tablet (25 mg total) by mouth every 6 (six) hours as needed for nausea or vomiting. 30 tablet 0   rifaximin (XIFAXAN) 550 MG TABS tablet Take 1 tablet (  550 mg total) by mouth 2 (two) times daily. 60 tablet 11   spironolactone (ALDACTONE) 100 MG tablet Take 1 tablet (100 mg total) by mouth daily. 30 tablet 1   vitamin B-12 (CYANOCOBALAMIN) 1000 MCG tablet Take 1 tablet (1,000 mcg total) by mouth daily. 90 tablet 0   fentaNYL (DURAGESIC) 12 MCG/HR Place 1 patch onto the skin every 3 (three) days. (Patient not taking: Reported on 09/19/2021) 5 patch 0   spironolactone (ALDACTONE) 100 MG tablet Take 1 tablet (100 mg total) by mouth daily. (Patient not taking: Reported on 09/19/2021) 30 tablet 3   Current Facility-Administered Medications  Medication Dose Route Frequency Provider Last Rate Last Admin   albumin human 25 % solution 25 g  25 g Intravenous Once [name], MD       Facility-Administered Medications Ordered in Other Visits  Medication Dose Route Frequency Provider Last Rate Last Admin   sodium chloride flush (NS) 0.9 % injection 10 mL  10 mL Intravenous PRN Earlie Server, MD   10 mL at 01/22/20 0831     PHYSICAL EXAMINATION: ECOG PERFORMANCE STATUS: 1 - Symptomatic but completely ambulatory Vitals:   09/19/21 0903  BP: 116/62  Pulse: 71  Resp: 18  Temp: (!) 96.6 F (35.9 C)   Filed Weights   09/19/21 0903  Weight: 219 lb (99.3 kg)    Physical Exam Constitutional:      General: He is not in acute distress.    Appearance: He is obese.  HENT:     Head: Normocephalic and atraumatic.  Eyes:     General: No scleral icterus. Cardiovascular:      Rate and Rhythm: Normal rate and regular rhythm.     Heart sounds: Normal heart sounds.  Pulmonary:     Effort: Pulmonary effort is normal. No respiratory distress.     Breath sounds: No wheezing.  Abdominal:     General: Bowel sounds are normal. There is no distension.     Palpations: Abdomen is soft.  Musculoskeletal:        General: No deformity. Normal range of motion.     Cervical back: Normal range of motion and neck supple.  Skin:    General: Skin is warm and dry.     Findings: No erythema or rash.  Neurological:     Mental Status: He is alert and oriented to person, place, and time. Mental status is at baseline.     Cranial Nerves: No cranial nerve deficit.     Coordination: Coordination normal.  Psychiatric:        Mood and Affect: Mood normal.    LABORATORY DATA:  I have reviewed the data as listed Lab Results  Component Value Date   WBC 2.5 (L) 09/19/2021   HGB 10.5 (L) 09/19/2021   HCT 29.9 (L) 09/19/2021   MCV 93.1 09/19/2021   PLT 60 (L) 09/19/2021   Recent Labs    04/14/21 1656 04/15/21 1118 05/12/21 0807 06/16/21 0941 09/01/21 0923 09/19/21 0837  NA 132*   < > 132* 130* 130* 133*  K 5.1   < > 3.9 4.2 3.7 4.4  CL 105   < > 100 97* 99 103  CO2 21*   < > 24 21* 25 23  GLUCOSE 140*   < > 138* 185* 159* 155*  BUN 32*   < > 39* 83* 50* 53*  CREATININE 1.75*   < > 1.83* 2.28* 2.09* 2.12*  CALCIUM 8.5*   < > 8.4* 8.7*  9.2 9.4  GFRNONAA 45*   < > 42* 32* 36* 35*  PROT 6.4*   < > 7.1  --  7.8 7.8  ALBUMIN 2.9*   < > 3.2*  --  4.0 3.9  AST 36   < > 41  --  36 45*  ALT 23   < > 31  --  24 36  ALKPHOS 86   < > 136*  --  116 145*  BILITOT 1.0   < > 0.8  --  0.9 0.5  BILIDIR 0.2  --   --   --   --   --   IBILI 0.8  --   --   --   --   --    < > = values in this interval not displayed.    Iron/TIBC/Ferritin/ %Sat    Component Value Date/Time   IRON 60 09/02/2020 0822   IRON 105 10/15/2018 1325   TIBC 293 09/02/2020 0822   TIBC 244 (L) 10/15/2018 1325    FERRITIN 164 09/02/2020 0822   FERRITIN 588 (H) 10/15/2018 1325   IRONPCTSAT 21 09/02/2020 0822   IRONPCTSAT 43 10/15/2018 1325      RADIOGRAPHIC STUDIES: I have personally reviewed the radiological images as listed and agreed with the findings in the report. CT CHEST ABDOMEN PELVIS WO CONTRAST  Result Date: 09/15/2021 CLINICAL DATA:  Metastatic renal cell carcinoma restaging EXAM: CT CHEST, ABDOMEN AND PELVIS WITHOUT CONTRAST TECHNIQUE: Multidetector CT imaging of the chest, abdomen and pelvis was performed following the standard protocol without IV contrast. Oral enteric contrast was administered. RADIATION DOSE REDUCTION: This exam was performed according to the departmental dose-optimization program which includes automated exposure control, adjustment of the mA and/or kV according to patient size and/or use of iterative reconstruction technique. COMPARISON:  05/30/2021 FINDINGS: CT CHEST FINDINGS Cardiovascular: Right chest port catheter. Aortic atherosclerosis. Aortic valve calcifications. Normal heart size. Extensive three vessel coronary artery calcifications. No pericardial effusion. Mediastinum/Nodes: No enlarged mediastinal, hilar, or axillary lymph nodes. Thyroid gland, trachea, and esophagus demonstrate no significant findings. Lungs/Pleura: Unchanged 0.8 cm nodule of the lingula (series 3, image 82). Unchanged 0.4 cm nodule of the right lower lobe (series 3, image 96). Unchanged 0.3 cm nodule just anteriorly in the right lower lobe (series 3, image 96). Slightly enlarged 0.8 cm subpleural nodule of the left lower lobe posterior to the left hilum, previously 0.5 cm (series 3, image 67). No pleural effusion or pneumothorax. Musculoskeletal: No chest wall mass. Unchanged lytic osseous lesion of the T9 vertebral body with redemonstrated bridging fusion of T7 through T11. CT ABDOMEN PELVIS FINDINGS Hepatobiliary: Coarse, nodular, cirrhotic morphology of the liver. No gallstones, gallbladder  wall thickening, or biliary dilatation. Pancreas: Unremarkable. No pancreatic ductal dilatation or surrounding inflammatory changes. Spleen: Gross splenomegaly, maximum coronal span 21.1 cm. Adrenals/Urinary Tract: Adrenal glands are unremarkable. Status post left nephrectomy. The right kidney is normal. No urinary tract calculi, hydronephrosis, or obvious solid lesion. Bladder is unremarkable. Stomach/Bowel: Stomach is within normal limits. Appendix appears normal. No evidence of bowel wall thickening, distention, or inflammatory changes. Vascular/Lymphatic: Aortic atherosclerosis. Unchanged prominent retroperitoneal lymph nodes and adjacent fat stranding, largest left retroperitoneal nodes measuring 1.1 x 0.9 cm (series 2, image 72). Reproductive: No mass or other abnormality. Penile implant and left lower quadrant reservoir. Other: No abdominal wall hernia or abnormality. Trace perihepatic ascites. Musculoskeletal: No acute osseous findings. Unchanged tiny lucent lesion of the inferior endplate of L3 (series 5, image 105). IMPRESSION: 1. Slightly  enlarged 0.8 cm subpleural nodule of the left lower lobe, previously 0.5 cm, highly concerning for an enlarging metastasis. 2. Other small nodules are unchanged. 3. Unchanged lytic metastatic lesion of the T9 vertebral body with bridging fusion of the lower thoracic spine. 4. Unchanged tiny lucent lesion of the inferior endplate of L3, nonspecific but modestly suspicious for an additional small osseous metastasis. Attention on follow-up. 5. Unchanged prominent retroperitoneal lymph nodes and adjacent fat stranding, nonspecific in the setting of cirrhosis. Attention on follow-up. 6. No noncontrast evidence of new soft tissue metastatic disease in the abdomen or pelvis. 7. Status post left nephrectomy. 8. Stigmata of cirrhosis and portal hypertension with trace perihepatic ascites. 9. Coronary artery disease. Aortic Atherosclerosis (ICD10-I70.0). Electronically Signed    By: Delanna Ahmadi M.D.   On: 09/15/2021 10:26      ASSESSMENT & PLAN:  1. Renal cell carcinoma of left kidney (HCC)   2. Chronic kidney disease, stage 3b ([place])   3. Cirrhosis of liver with ascites, unspecified hepatic cirrhosis type ([place])   4. Thrombocytopenia ([place])   5. Encounter for antineoplastic immunotherapy   6. 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 Immunotherapy induced diarrhea has completely resolved. 09/14/2021, CT chest abdomen pelvis without contrast showed 0.8 cm subpleural nodule of the left lower lobe, previously 0.5 cm.  Other small nodules are unchanged.  Unchanged lytic lesion of T9 with bridging fusion of the lower thoracic spine.  Unchanged tiny lucent lesion of the inferior endplate of L3.  Nonspecific. Unchanged prominent retroperitoneal lymph node and adjacent fat stranding.  Attention on follow-up.  No noncontrast evidence of new soft tissue metastasis in the abdomen or pelvis.  Status post left nephrectomy.  Cirrhosis and portal hypertension with trace perihepatic ascites.  Coronary artery disease  Overall stable disease. Discussed with patient about rationale and potential risk of resuming nivolumab. Patient agrees with resuming.  I will have a low threshold if he encounters any immunotherapy induced diarrhea. Labs reviewed and discussed with patient.  Proceed with nivolumab today.  # T9 lesion, s/p resection and spine fusion.  Pathology is positive for RCC. Last Xgeva 06/16/2021.    #  liver cirrhosis, recurrent ascites, symptoms have improved.  Continue follow-up with GI.  Continue diuretics.  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  # Normal TSH, slightly elevated T4 level.  Off synthroid.    Follow-up in 2 weeks.  All questions were answered. The patient knows to call  the clinic with any problems questions or concerns.   Earlie Server, MD, PhD 09/19/2021

## 2021-09-19 NOTE — Progress Notes (Signed)
Pt here for follow up. No new concerns voiced.   

## 2021-10-03 ENCOUNTER — Inpatient Hospital Stay (HOSPITAL_BASED_OUTPATIENT_CLINIC_OR_DEPARTMENT_OTHER): Payer: Medicare Other | Admitting: Oncology

## 2021-10-03 ENCOUNTER — Other Ambulatory Visit: Payer: Self-pay

## 2021-10-03 ENCOUNTER — Encounter: Payer: Self-pay | Admitting: Oncology

## 2021-10-03 ENCOUNTER — Inpatient Hospital Stay: Payer: Medicare Other | Attending: Hospice and Palliative Medicine

## 2021-10-03 ENCOUNTER — Inpatient Hospital Stay: Payer: Medicare Other

## 2021-10-03 VITALS — BP 98/60 | HR 67

## 2021-10-03 VITALS — BP 90/60 | HR 77 | Temp 97.6°F | Resp 16 | Wt 214.8 lb

## 2021-10-03 DIAGNOSIS — E1122 Type 2 diabetes mellitus with diabetic chronic kidney disease: Secondary | ICD-10-CM | POA: Diagnosis not present

## 2021-10-03 DIAGNOSIS — R161 Splenomegaly, not elsewhere classified: Secondary | ICD-10-CM | POA: Diagnosis not present

## 2021-10-03 DIAGNOSIS — Z794 Long term (current) use of insulin: Secondary | ICD-10-CM | POA: Insufficient documentation

## 2021-10-03 DIAGNOSIS — M549 Dorsalgia, unspecified: Secondary | ICD-10-CM | POA: Insufficient documentation

## 2021-10-03 DIAGNOSIS — R188 Other ascites: Secondary | ICD-10-CM | POA: Diagnosis not present

## 2021-10-03 DIAGNOSIS — K7460 Unspecified cirrhosis of liver: Secondary | ICD-10-CM | POA: Diagnosis not present

## 2021-10-03 DIAGNOSIS — Z803 Family history of malignant neoplasm of breast: Secondary | ICD-10-CM | POA: Insufficient documentation

## 2021-10-03 DIAGNOSIS — I2510 Atherosclerotic heart disease of native coronary artery without angina pectoris: Secondary | ICD-10-CM | POA: Insufficient documentation

## 2021-10-03 DIAGNOSIS — R197 Diarrhea, unspecified: Secondary | ICD-10-CM | POA: Diagnosis not present

## 2021-10-03 DIAGNOSIS — C642 Malignant neoplasm of left kidney, except renal pelvis: Secondary | ICD-10-CM

## 2021-10-03 DIAGNOSIS — Z923 Personal history of irradiation: Secondary | ICD-10-CM | POA: Diagnosis not present

## 2021-10-03 DIAGNOSIS — Z800 Family history of malignant neoplasm of digestive organs: Secondary | ICD-10-CM | POA: Diagnosis not present

## 2021-10-03 DIAGNOSIS — I700 Atherosclerosis of aorta: Secondary | ICD-10-CM | POA: Diagnosis not present

## 2021-10-03 DIAGNOSIS — G8929 Other chronic pain: Secondary | ICD-10-CM | POA: Diagnosis not present

## 2021-10-03 DIAGNOSIS — K439 Ventral hernia without obstruction or gangrene: Secondary | ICD-10-CM | POA: Insufficient documentation

## 2021-10-03 DIAGNOSIS — I129 Hypertensive chronic kidney disease with stage 1 through stage 4 chronic kidney disease, or unspecified chronic kidney disease: Secondary | ICD-10-CM | POA: Insufficient documentation

## 2021-10-03 DIAGNOSIS — Z79899 Other long term (current) drug therapy: Secondary | ICD-10-CM | POA: Insufficient documentation

## 2021-10-03 DIAGNOSIS — C7800 Secondary malignant neoplasm of unspecified lung: Secondary | ICD-10-CM | POA: Diagnosis not present

## 2021-10-03 DIAGNOSIS — Z5112 Encounter for antineoplastic immunotherapy: Secondary | ICD-10-CM | POA: Insufficient documentation

## 2021-10-03 DIAGNOSIS — I952 Hypotension due to drugs: Secondary | ICD-10-CM

## 2021-10-03 DIAGNOSIS — N1832 Chronic kidney disease, stage 3b: Secondary | ICD-10-CM | POA: Diagnosis not present

## 2021-10-03 DIAGNOSIS — K766 Portal hypertension: Secondary | ICD-10-CM | POA: Insufficient documentation

## 2021-10-03 DIAGNOSIS — Z905 Acquired absence of kidney: Secondary | ICD-10-CM | POA: Insufficient documentation

## 2021-10-03 DIAGNOSIS — C649 Malignant neoplasm of unspecified kidney, except renal pelvis: Secondary | ICD-10-CM

## 2021-10-03 DIAGNOSIS — D696 Thrombocytopenia, unspecified: Secondary | ICD-10-CM | POA: Insufficient documentation

## 2021-10-03 DIAGNOSIS — E785 Hyperlipidemia, unspecified: Secondary | ICD-10-CM | POA: Insufficient documentation

## 2021-10-03 DIAGNOSIS — Z7984 Long term (current) use of oral hypoglycemic drugs: Secondary | ICD-10-CM | POA: Insufficient documentation

## 2021-10-03 LAB — CBC WITH DIFFERENTIAL/PLATELET
Abs Immature Granulocytes: 0.03 10*3/uL (ref 0.00–0.07)
Basophils Absolute: 0 10*3/uL (ref 0.0–0.1)
Basophils Relative: 1 %
Eosinophils Absolute: 0.2 10*3/uL (ref 0.0–0.5)
Eosinophils Relative: 7 %
HCT: 31.2 % — ABNORMAL LOW (ref 39.0–52.0)
Hemoglobin: 11.1 g/dL — ABNORMAL LOW (ref 13.0–17.0)
Immature Granulocytes: 1 %
Lymphocytes Relative: 11 %
Lymphs Abs: 0.4 10*3/uL — ABNORMAL LOW (ref 0.7–4.0)
MCH: 32.6 pg (ref 26.0–34.0)
MCHC: 35.6 g/dL (ref 30.0–36.0)
MCV: 91.8 fL (ref 80.0–100.0)
Monocytes Absolute: 0.5 10*3/uL (ref 0.1–1.0)
Monocytes Relative: 16 %
Neutro Abs: 2 10*3/uL (ref 1.7–7.7)
Neutrophils Relative %: 64 %
Platelets: 58 10*3/uL — ABNORMAL LOW (ref 150–400)
RBC: 3.4 MIL/uL — ABNORMAL LOW (ref 4.22–5.81)
RDW: 13.7 % (ref 11.5–15.5)
WBC: 3.1 10*3/uL — ABNORMAL LOW (ref 4.0–10.5)
nRBC: 0 % (ref 0.0–0.2)

## 2021-10-03 LAB — COMPREHENSIVE METABOLIC PANEL
ALT: 26 U/L (ref 0–44)
AST: 38 U/L (ref 15–41)
Albumin: 4 g/dL (ref 3.5–5.0)
Alkaline Phosphatase: 106 U/L (ref 38–126)
Anion gap: 11 (ref 5–15)
BUN: 67 mg/dL — ABNORMAL HIGH (ref 6–20)
CO2: 22 mmol/L (ref 22–32)
Calcium: 9.8 mg/dL (ref 8.9–10.3)
Chloride: 99 mmol/L (ref 98–111)
Creatinine, Ser: 2.46 mg/dL — ABNORMAL HIGH (ref 0.61–1.24)
GFR, Estimated: 30 mL/min — ABNORMAL LOW (ref >60)
Glucose, Bld: 140 mg/dL — ABNORMAL HIGH (ref 70–99)
Potassium: 4.2 mmol/L (ref 3.5–5.1)
Sodium: 132 mmol/L — ABNORMAL LOW (ref 135–145)
Total Bilirubin: 0.9 mg/dL (ref 0.3–1.2)
Total Protein: 8.3 g/dL — ABNORMAL HIGH (ref 6.5–8.1)

## 2021-10-03 MED ORDER — SODIUM CHLORIDE 0.9 % IV SOLN
240.0000 mg | Freq: Once | INTRAVENOUS | Status: AC
  Administered 2021-10-03: 240 mg via INTRAVENOUS
  Filled 2021-10-03: qty 24

## 2021-10-03 MED ORDER — DENOSUMAB 120 MG/1.7ML ~~LOC~~ SOLN: 120.0000 mg | Freq: Once | SUBCUTANEOUS | Status: DC

## 2021-10-03 MED ORDER — DENOSUMAB 120 MG/1.7ML ~~LOC~~ SOLN
120.0000 mg | Freq: Once | SUBCUTANEOUS | Status: AC
  Administered 2021-10-03: 120 mg via SUBCUTANEOUS
  Filled 2021-10-03: qty 1.7

## 2021-10-03 MED ORDER — HEPARIN SOD (PORK) LOCK FLUSH 100 UNIT/ML IV SOLN
INTRAVENOUS | Status: AC
  Filled 2021-10-03: qty 5

## 2021-10-03 MED ORDER — SODIUM CHLORIDE 0.9 % IV SOLN
Freq: Once | INTRAVENOUS | Status: AC
  Filled 2021-10-03: qty 250

## 2021-10-03 MED ORDER — HEPARIN SOD (PORK) LOCK FLUSH 100 UNIT/ML IV SOLN
500.0000 [IU] | Freq: Once | INTRAVENOUS | Status: AC | PRN
  Administered 2021-10-03: 500 [IU]
  Filled 2021-10-03: qty 5

## 2021-10-03 NOTE — Patient Instructions (Addendum)
[facility] CANCER CTR AT [place]  Discharge Instructions: Thank you for choosing [place] to provide your oncology and hematology care.  If you have a lab appointment with the [facility], please go directly to the [facility] and check in at the registration area.  Wear comfortable clothing and clothing appropriate for easy access to any Portacath or PICC line.   We strive to give you quality time with your provider. You may need to reschedule your appointment if you arrive late (15 or more minutes).  Arriving late affects you and other patients whose appointments are after yours.  Also, if you miss three or more appointments without notifying the office, you may be dismissed from the clinic at the providers discretion.      For prescription refill requests, have your pharmacy contact our office and allow 72 hours for refills to be completed.    Today you received the following chemotherapy and/or immunotherapy agents nivolumab, xgeva      To help prevent nausea and vomiting after your treatment, we encourage you to take your nausea medication as directed.  BELOW ARE SYMPTOMS THAT SHOULD BE REPORTED IMMEDIATELY: *FEVER GREATER THAN 100.4 F (38 C) OR HIGHER *CHILLS OR SWEATING *NAUSEA AND VOMITING THAT IS NOT CONTROLLED WITH YOUR NAUSEA MEDICATION *UNUSUAL SHORTNESS OF BREATH *UNUSUAL BRUISING OR BLEEDING *URINARY PROBLEMS (pain or burning when urinating, or frequent urination) *BOWEL PROBLEMS (unusual diarrhea, constipation, pain near the anus) TENDERNESS IN MOUTH AND THROAT WITH OR WITHOUT PRESENCE OF ULCERS (sore throat, sores in mouth, or a toothache) UNUSUAL RASH, SWELLING OR PAIN  UNUSUAL VAGINAL DISCHARGE OR ITCHING   Items with * indicate a potential emergency and should be followed up as soon as possible or go to the Emergency Department if any problems should occur.  Please show the CHEMOTHERAPY ALERT CARD or IMMUNOTHERAPY ALERT CARD at  check-in to the Emergency Department and triage nurse.  Should you have questions after your visit or need to cancel or reschedule your appointment, please contact [facility] CANCER [place] AT [place]  [phone] and follow the prompts.  Office hours are 8:00 a.m. to 4:30 p.m. Monday - Friday. Please note that voicemails left after 4:00 p.m. may not be returned until the following business day.  We are closed weekends and major holidays. You have access to a nurse at all times for urgent questions. Please call the main number to the clinic [phone] and follow the prompts.  For any non-urgent questions, you may also contact your provider using MyChart. We now offer e-Visits for anyone 59 and older to request care online for non-urgent symptoms. For details visit [email or web address].   Also download the MyChart app! Go to the app store, search "MyChart", open the app, select [place], and log in with your MyChart username and password.  Due to Covid, a mask is required upon entering the hospital/clinic. If you do not have a mask, one will be given to you upon arrival. For doctor visits, patients may have 1 support person aged 59 or older with them. For treatment visits, patients cannot have anyone with them due to current Covid guidelines and our immunocompromised population.   Nivolumab injection What is this medication? NIVOLUMAB (nye VOL ue mab) is a monoclonal antibody. It treats certain types of cancer. Some of the cancers treated are colon cancer, head and neck cancer, Hodgkin lymphoma, lung cancer, and melanoma. This medicine may be used for other purposes; ask your health care provider or pharmacist  if you have questions. COMMON BRAND NAME(S): Opdivo What should I tell my care team before I take this medication? They need to know if you have any of these conditions: Autoimmune diseases such as Crohn's disease, ulcerative colitis, or lupus Have had or planning to have an  allogeneic stem cell transplant (uses someone else's stem cells) History of chest radiation Organ transplant Nervous system problems such as myasthenia gravis or Guillain-Barre syndrome An unusual or allergic reaction to nivolumab, other medicines, foods, dyes, or preservatives Pregnant or trying to get pregnant Breast-feeding How should I use this medication? This medication is injected into a vein. It is given in a hospital or clinic setting. A special MedGuide will be given to you before each treatment. Be sure to read this information carefully each time. Talk to your care team regarding the use of this medication in children. While it may be prescribed for children as young as 12 years for selected conditions, precautions do apply. Overdosage: If you think you have taken too much of this medicine contact a poison control center or emergency room at once. NOTE: This medicine is only for you. Do not share this medicine with others. What if I miss a dose? Keep appointments for follow-up doses. It is important not to miss your dose. Call your care team if you are unable to keep an appointment. What may interact with this medication? Interactions have not been studied. This list may not describe all possible interactions. Give your health care provider a list of all the medicines, herbs, non-prescription drugs, or dietary supplements you use. Also tell them if you smoke, drink alcohol, or use illegal drugs. Some items may interact with your medicine. What should I watch for while using this medication? Your condition will be monitored carefully while you are receiving this medication. You may need blood work done while you are taking this medication. Do not become pregnant while taking this medication or for 5 months after stopping it. Women should inform their care team if they wish to become pregnant or think they might be pregnant. There is a potential for serious harm to an unborn child.  Talk to your care team for more information. Do not breast-feed an infant while taking this medication or for 5 months after stopping it. What side effects may I notice from receiving this medication? Side effects that you should report to your care team as soon as possible: Allergic reactions--skin rash, itching, hives, swelling of the face, lips, tongue, or throat Bloody or black, tar-like stools Change in vision Chest pain Diarrhea Dry cough, shortness of breath or trouble breathing Eye pain Fast or irregular heartbeat Fever, chills High blood sugar (hyperglycemia)--increased thirst or amount of urine, unusual weakness or fatigue, blurry vision High thyroid levels (hyperthyroidism)--fast or irregular heartbeat, weight loss, excessive sweating or sensitivity to heat, tremors or shaking, anxiety, nervousness, irregular menstrual cycle or spotting Kidney injury--decrease in the amount of urine, swelling of the ankles, hands, or feet Liver injury--right upper belly pain, loss of appetite, nausea, light-colored stool, dark yellow or brown urine, yellowing skin or eyes, unusual weakness or fatigue Low red blood cell count--unusual weakness or fatigue, dizziness, headache, trouble breathing Low thyroid levels (hypothyroidism)--unusual weakness or fatigue, increased sensitivity to cold, constipation, hair loss, dry skin, weight gain, feelings of depression Mood and behavior changes-confusion, change in sex drive or performance, irritability Muscle pain or cramps Pain, tingling, or numbness in the hands or feet, muscle weakness, trouble walking, loss of balance or  coordination Red or dark brown urine Redness, blistering, peeling, or loosening of the skin, including inside the mouth Stomach pain Unusual bruising or bleeding Side effects that usually do not require medical attention (report to your care team if they continue or are bothersome): Bone pain Constipation Loss of  appetite Nausea Tiredness Vomiting This list may not describe all possible side effects. Call your doctor for medical advice about side effects. You may report side effects to FDA at 1-800-FDA-1088. Where should I keep my medication? This medication is given in a hospital or clinic and will not be stored at home. NOTE: This sheet is a summary. It may not cover all possible information. If you have questions about this medicine, talk to your doctor, pharmacist, or health care provider.  2022 Elsevier/Gold Standard (2021-04-05 00:00:00)  Denosumab injection What is this medication? DENOSUMAB (den oh sue mab) slows bone breakdown. Prolia is used to treat osteoporosis in women after menopause and in men, and in people who are taking corticosteroids for 6 months or more. Delton See is used to treat a high calcium level due to cancer and to prevent bone fractures and other bone problems caused by multiple myeloma or cancer bone metastases. Delton See is also used to treat giant cell tumor of the bone. This medicine may be used for other purposes; ask your health care provider or pharmacist if you have questions. COMMON BRAND NAME(S): Prolia, XGEVA What should I tell my care team before I take this medication? They need to know if you have any of these conditions: dental disease having surgery or tooth extraction infection kidney disease low levels of calcium or Vitamin D in the blood malnutrition on hemodialysis skin conditions or sensitivity thyroid or parathyroid disease an unusual reaction to denosumab, other medicines, foods, dyes, or preservatives pregnant or trying to get pregnant breast-feeding How should I use this medication? This medicine is for injection under the skin. It is given by a health care professional in a hospital or clinic setting. A special MedGuide will be given to you before each treatment. Be sure to read this information carefully each time. For Prolia, talk to your  pediatrician regarding the use of this medicine in children. Special care may be needed. For Delton See, talk to your pediatrician regarding the use of this medicine in children. While this drug may be prescribed for children as young as 13 years for selected conditions, precautions do apply. Overdosage: If you think you have taken too much of this medicine contact a poison control center or emergency room at once. NOTE: This medicine is only for you. Do not share this medicine with others. What if I miss a dose? It is important not to miss your dose. Call your doctor or health care professional if you are unable to keep an appointment. What may interact with this medication? Do not take this medicine with any of the following medications: other medicines containing denosumab This medicine may also interact with the following medications: medicines that lower your chance of fighting infection steroid medicines like prednisone or cortisone This list may not describe all possible interactions. Give your health care provider a list of all the medicines, herbs, non-prescription drugs, or dietary supplements you use. Also tell them if you smoke, drink alcohol, or use illegal drugs. Some items may interact with your medicine. What should I watch for while using this medication? Visit your doctor or health care professional for regular checks on your progress. Your doctor or health care professional may  order blood tests and other tests to see how you are doing. Call your doctor or health care professional for advice if you get a fever, chills or sore throat, or other symptoms of a cold or flu. Do not treat yourself. This drug may decrease your body's ability to fight infection. Try to avoid being around people who are sick. You should make sure you get enough calcium and vitamin D while you are taking this medicine, unless your doctor tells you not to. Discuss the foods you eat and the vitamins you take with  your health care professional. See your dentist regularly. Brush and floss your teeth as directed. Before you have any dental work done, tell your dentist you are receiving this medicine. Do not become pregnant while taking this medicine or for 5 months after stopping it. Talk with your doctor or health care professional about your birth control options while taking this medicine. Women should inform their doctor if they wish to become pregnant or think they might be pregnant. There is a potential for serious side effects to an unborn child. Talk to your health care professional or pharmacist for more information. What side effects may I notice from receiving this medication? Side effects that you should report to your doctor or health care professional as soon as possible: allergic reactions like skin rash, itching or hives, swelling of the face, lips, or tongue bone pain breathing problems dizziness jaw pain, especially after dental work redness, blistering, peeling of the skin signs and symptoms of infection like fever or chills; cough; sore throat; pain or trouble passing urine signs of low calcium like fast heartbeat, muscle cramps or muscle pain; pain, tingling, numbness in the hands or feet; seizures unusual bleeding or bruising unusually weak or tired Side effects that usually do not require medical attention (report to your doctor or health care professional if they continue or are bothersome): constipation diarrhea headache joint pain loss of appetite muscle pain runny nose tiredness upset stomach This list may not describe all possible side effects. Call your doctor for medical advice about side effects. You may report side effects to FDA at 1-800-FDA-1088. Where should I keep my medication? This medicine is only given in a clinic, doctor's office, or other health care setting and will not be stored at home. NOTE: This sheet is a summary. It may not cover all possible  information. If you have questions about this medicine, talk to your doctor, pharmacist, or health care provider.  2022 Elsevier/Gold Standard (2017-11-23 00:00:00)  [facility]CER CTR AT [place]  Discharge Instructions: Thank you for choosing [facility] to provide your oncology and hematology care.   If you have a lab appointment with the [facility], please go directly to the [place] and check in at the registration area.   Wear comfortable clothing and clothing appropriate for easy access to any Portacath or PICC line.   We strive to give you quality time with your provider. You may need to reschedule your appointment if you arrive late (15 or more minutes).  Arriving late affects you and other patients whose appointments are after yours.  Also, if you miss three or more appointments without notifying the office, you may be dismissed from the clinic at the providers discretion.      For prescription refill requests, have your pharmacy contact our office and allow 72 hours for refills to be completed.    Today you received the following chemotherapy and/or immunotherapy  agents       To help prevent nausea and vomiting after your treatment, we encourage you to take your nausea medication as directed.  BELOW ARE SYMPTOMS THAT SHOULD BE REPORTED IMMEDIATELY: *FEVER GREATER THAN 100.4 F (38 C) OR HIGHER *CHILLS OR SWEATING *NAUSEA AND VOMITING THAT IS NOT CONTROLLED WITH YOUR NAUSEA MEDICATION *UNUSUAL SHORTNESS OF BREATH *UNUSUAL BRUISING OR BLEEDING *URINARY PROBLEMS (pain or burning when urinating, or frequent urination) *BOWEL PROBLEMS (unusual diarrhea, constipation, pain near the anus) TENDERNESS IN MOUTH AND THROAT WITH OR WITHOUT PRESENCE OF ULCERS (sore throat, sores in mouth, or a toothache) UNUSUAL RASH, SWELLING OR PAIN  UNUSUAL VAGINAL DISCHARGE OR ITCHING   Items with * indicate a potential emergency and should be followed up as soon  as possible or go to the Emergency Department if any problems should occur.  Please show the CHEMOTHERAPY ALERT CARD or IMMUNOTHERAPY ALERT CARD at check-in to the Emergency Department and triage nurse.  Should you have questions after your visit or need to cancel or reschedule your appointment, please contact [facility] AT [place]  Dept: [phone]  and follow the prompts.  Office hours are 8:00 a.m. to 4:30 p.m. Monday - Friday. Please note that voicemails left after 4:00 p.m. may not be returned until the following business day.  We are closed weekends and major holidays. You have access to a nurse at all times for urgent questions. Please call the main number to the clinic Dept: [phone] and follow the prompts.   For any non-urgent questions, you may also contact your provider using MyChart. We now offer e-Visits for anyone 73 and older to request care online for non-urgent symptoms. For details visit [email or web address].   Also download the MyChart app! Go to the app store, search "MyChart", open the app, select [facility], and log in with your MyChart username and password.  Due to Covid, a mask is required upon entering the hospital/clinic. If you do not have a mask, one will be given to you upon arrival. For doctor visits, patients may have 1 support person aged 29 or older with them. For treatment visits, patients cannot have anyone with them due to current Covid guidelines and our immunocompromised population.

## 2021-10-03 NOTE — Progress Notes (Signed)
Pt in for follow up, denies any concerns today. 

## 2021-10-03 NOTE — Progress Notes (Signed)
Hematology/Oncology follow-up [phone]    Patient Care Team: Albina Billet, MD as PCP - General (Internal Medicine) Earlie Server, MD as Consulting Physician (Hematology and Oncology)  REFERRING PROVIDER: Albina Billet, MD  CHIEF COMPLAINTS/REASON FOR VISIT:  Follow-up for kidney cancer treatments  HISTORY OF PRESENTING ILLNESS:   Henry Moore is a  61 y.o.  male with PMH listed below was seen in consultation at the request of  Albina Billet, MD  for evaluation of kidney cancer Extensive medical records were reviewed Patient had MRI lumbar spine without contrast done on 07/13/2018 which showed mired lumbar degenerative disease. CT thoracic spine was done on 08/05/2018 which showed 5 cm left renal mass. 09/04/2018 CT abdomen and pelvis with and without contrast showed 5 cm round enhancing solid mass in the left kidney.  No involvement of left renal vein.  No lymphadenopathy Morphologic changes consistent with cirrhosis and the portal hypertension.  Small volume ascites and splenomegaly. . 10/16/2018 Left nephrectomy showed renal cell carcinoma, clear-cell type, nuclear grade 4, tumor extends into the renal vein and renal sinus fat.  Urethral, vascular and or resection margins are negative for tumor pT3a Nx Mx  01/28/2019 patient had another CT chest abdomen pelvis done with contrast Showed interval left nephrectomy.  Scattered tiny pulmonary nodules bilaterally.  Nonspecific.  No other evidence of metastatic disease.  Cirrhosis changes extensive coronary and aortic atherosclerosis  07/14/2019 patient underwent surveillance CT chest abdomen pelvis without contrast Interval progression of pulmonary metastasis with new enlarged right paratracheal lymph node 1.7 cm, previously 0.6 cm one-point since worrisome for metastatic adenopathy.  Multiple progressive pulmonary nodules, index nodule within the lingula measures 1.3 cm, previously 3 mm. Index subpleural nodule within the anterior  right upper lobe measuring 1.8 cm, previously 3 cm Index nodule within the post lateral right lower lobe measuring 5 mm, new from previous care Aortic sclerosis.  Three-vessel coronary artery calcification noted.  Cirrhosis changes.  Splenomegaly.  Patient was referred to establish care with medical oncology for further discussion and evaluation of metastatic renal cell carcinoma. Patient reports feeling well at baseline today.  Denies any shortness of breath, cough, hemoptysis, back pain, headache. He quitted smoking 2015.  Not currently actively drinking alcohol as well. Patient has chronic back pain unchanged.  #08/14/2019-10/16/2019 nivolumab and ipilimumab. #11/06/2019-nivolumab every 2 weeks maintenance.  # Liver cirrhosis with small amount of ascites/splenomegaly patient sees gastroenterology Dr. [name].  . He will get ultrasound surveillance due in February 2021 for screening of [place].  EGD surveillance for Barrett's plan in April 2021.  # Diabetes, on Humalog sliding scale and metformin.  #Family history of cancer and personal history of RCC.  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  Status post ipilimumab and nivolumab treatment.  # 10/29/2019 CT chest abdomen pelvis without contrast images were independently reviewed by me and discussed with patient.   CT showed marked improvement with resolution of many of the pulmonary nodules, prominent reduced size of other nodules.  Considerably reduced size of the right lower paratracheal lymph node now 0.9 cm in diameter. Hepatic cirrhosis with prominent splenomegaly and trace perisplenic ascites. Chronic CT findings includes aortic atherosclerotic vascular disease.  Mild sigmoid colon diverticulosis  # CKD,  he establish care with Dr. Candiss Norse  for chronic kidney disease.  Blood pressure medication has been adjusted.  Hydrochlorothiazide decreased to 12.5 mg daily.  # NGS -foundation one PD-L1 TPS 0% no reportable mutation, MS stable. TMB 8 muts/mb VHL D130f*16, SETD2 BAP Genetic testing is negative.   # 07/16/2020, CT chest abdomen pelvis images were reviewed and discussed with patient Compared to July 2021 image, he has had a new patchy groundglass opacities throughout both lungs with central part of solids/airspace components.  Findings are nonspecific and could be secondary to an atypical infection, including viral pneumonia or inflammation.  Patient is asymptomatic.  I wonder the changes are secondary to his Covid infection.  Patient has been started on Levaquin to cover atypical infection by on-call physician and I recommend him to continue and complete the course. Proceed with today's immunotherapy nivolumab.  Advised patient to notify me if his symptoms get worse.  # 10/12/2020 CT chest abdomen pelvis with out contrast. New 15 mm lytic lesion in the right T9 vertebral body.  Suspicious for isolated lytic metastasis.  No findings of suspicious soft tissue metastasis in the chest abdomen or pelvis.  Residual postinfectious inflammatory scarring in the lung bilaterally.  Cirrhosis.  Splenomegaly.  Minimal ascites.  # 10/26/20 Bone scan showed Minimal increased activity noted at the right costovertebral junction at the T9 level is most likely degenerative. Similar finding noted on prior bone scan of 08/11/2019. No definite T9 vertebral body abnormal activity corresponding to lytic lesion noted on recent CT. Again the recently identified T9 vertebral body lytic lesion is suspicious and further evaluation of the thoracic spine with MRI can be obtained. No other bony abnormalities noted by bone scan. I discussed patient's case on tumor board 11/04/2020.  Bone scan probably is not sensitive in detecting lytic  lesion from [place]  Consensus reached upon clinically based on the CT scan, new bone lesion is highly likely metastasis [place]  Options including proceed with bone lesion biopsy versus referral to radiation oncology for empiric palliative radiation.  Patient has other comorbidities including cirrhosis/pancytopenia, patient opts to proceed with empiric palliative radiation.  12/13/2020 finished RT to T9 June 2022 abscess of anal area , s/ drainage and antibiotics. July 2022, immunotherapy was held due to diarrhea.  02/01/2021  mild wall thickening of aseconding colon. Small infraumbilical ventral hernia,contains short segment of small bowel. Cirrohsis and splenomegaly. Small volume ascites, slight increase 02/03/21 chest wo contrast showed  Lytic lesion within T9 slightly increased in size., no new skeletal metastasis. Mild peripheral ground glass densities.   03/18/2021, resumed on nivolumab treatment x1. 04/01/2021, hold nivolumab due to diarrhea and upcoming surgery. # 04/08/2021 paracentesis. Cytology negative for malignancy.  # 04/14/2021- [facility]. S/p paracentesis on 04/15/2021 , cytology negative.  # 04/22/21 right T9 transpedicular mass resection. T7-11 [facility] 05/06/2021 abdominal distention. S/p repeat therapeutic paracentesis.   05/30/2021, CT chest abdomen pelvis showed 8 mm left upper lobe pulmonary nodule, increased size compared to 2 mm previously.  Interval progression highly concerning for metastatic disease.  Primary bronchogenic neoplasm is also another consideration. Interval thoracic fusion above and below the T10.  Previously described tiny lucency inferior L3 vertebral body is stable.  Cirrhosis with splenomegaly and small volume ascites.  Cholelithiasis.  Aortic atherosclerosis  #08/12/2021, status post stereotactic radiation to lung  09/14/2021, CT chest abdomen pelvis without contrast showed 0.8  cm subpleural nodule of the left lower lobe, previously 0.5 cm.  Other small nodules are unchanged.  Unchanged lytic lesion of T9 with bridging fusion of the lower thoracic spine.  Unchanged tiny lucent lesion of the inferior endplate of L3.  Nonspecific. Unchanged prominent retroperitoneal lymph node and adjacent fat stranding.  Attention on follow-up.  No noncontrast evidence of new soft tissue metastasis in the abdomen or pelvis.  Status post left nephrectomy.  Cirrhosis and portal hypertension with trace perihepatic ascites.  Coronary artery disease   #09/19/2021, resumed on nivolumab treatments. INTERVAL HISTORY Henry Moore is a 61 y.o. male who has above history reviewed by me today presents for follow up visit for management of stage IV RCC Resumed on evaluate treatment.  He denies diarrhea. BP is low in the clinic 90/60.  Heart rate 77. Patient is on Coreg 25 mg twice daily as well as Lasix and spironolactone.  He has been holding Coreg recently and took a dose of Coreg this morning.   Review of Systems  Constitutional:  Positive for fatigue. Negative for appetite change, chills, fever and unexpected weight change.  HENT:   Negative for hearing loss and voice change.   Eyes:  Negative for eye problems and icterus.  Respiratory:  Negative for chest tightness, cough and shortness of breath.   Cardiovascular:  Negative for chest pain and leg swelling.  Gastrointestinal:  Negative for abdominal distention, abdominal pain, diarrhea, nausea and vomiting.  Endocrine: Negative for hot flashes.  Genitourinary:  Negative for difficulty urinating, dysuria and frequency.   Musculoskeletal:  Positive for arthralgias.       Chronic back pain  Skin:  Negative for itching and rash.       Hair loss  Neurological:  Negative for light-headedness and numbness.  Hematological:  Negative for adenopathy. Does not bruise/bleed easily.  Psychiatric/Behavioral:  Negative for confusion.    MEDICAL  HISTORY:  Past Medical History:  Diagnosis Date   Cough    SINUS DRAINAGE CAUSING COUGHING , REPORTS CLEAR MUCOUS    Diabetes mellitus without complication (HCC)    Family history of breast cancer    Family history of colon cancer    Family history of stomach cancer    HTN (hypertension)    Hyperlipemia    Left renal mass    Platelets decreased ([place])    DENIES UNSUAL BLEEDING    PONV (postoperative nausea and vomiting)    Renal cell carcinoma ([place]) 08/04/2019   WBC decreased     SURGICAL HISTORY: Past Surgical History:  Procedure Laterality Date   ANKLE SURGERY Left    ANTERIOR CERVICAL DECOMP/DISCECTOMY FUSION N/A 04/16/2014   Procedure: ANTERIOR CERVICAL DECOMPRESSION/DISCECTOMY FUSION  (ACDF C5-C7)   (2 LEVELS)     ;  Surgeon: Melina Schools, MD;  Location: [facility];  Service: Orthopedics;  Laterality: N/A;   APPENDECTOMY     BACK SURGERY     ESOPHAGOGASTRODUODENOSCOPY (EGD) WITH PROPOFOL N/A 02/06/2019   Procedure: ESOPHAGOGASTRODUODENOSCOPY (EGD) WITH PROPOFOL;  Surgeon: [name], MD;  Location: [facility] ENDOSCOPY;  Service: Gastroenterology;  Laterality: N/A;   ESOPHAGOGASTRODUODENOSCOPY (EGD) WITH PROPOFOL N/A 03/04/2019   Procedure: ESOPHAGOGASTRODUODENOSCOPY (EGD) WITH PROPOFOL;  Surgeon: Lin Landsman, MD;  Location: [facility];  Service: Gastroenterology;  Laterality: N/A;   ESOPHAGOGASTRODUODENOSCOPY (EGD) WITH PROPOFOL N/A 04/22/2019   Procedure: ESOPHAGOGASTRODUODENOSCOPY (EGD) WITH PROPOFOL;  Surgeon: [name], MD;  Location[facility] ENDOSCOPY;  Service: Gastroenterology;  Laterality: N/A;   FRACTURE SURGERY     HYDROCELE EXCISION     KNEE ARTHROSCOPY Bilateral  LAPAROSCOPIC NEPHRECTOMY, HAND ASSISTED Left 10/16/2018   Procedure: LEFT HAND ASSISTED LAPAROSCOPIC NEPHRECTOMY;  Surgeon: Lucas Mallow, MD;  Location: [facility];  Service: Urology;  Laterality: Left;   NASAL SINUS SURGERY     X 2   PENILE PROSTHESIS IMPLANT  10 YEARS AGO   PORTA CATH INSERTION N/A  11/11/2019   Procedure: PORTA CATH INSERTION;  Surgeon: Katha Cabal, MD;  Location: [facility];  Service: Cardiovascular;  Laterality: N/A;   SHOULDER SURGERY Left     SOCIAL HISTORY: Social History   Socioeconomic History   Marital status: Married    Spouse name: Not on file   Number of children: Not on file   Years of education: Not on file   Highest education level: Not on file  Occupational History   Not on file  Tobacco Use   Smoking status: Former    Packs/day: 0.25    Years: 20.00    Pack years: 5.00    Types: Cigarettes    Quit date: 2015    Years since quitting: 8.1   Smokeless tobacco: Never  Vaping Use   Vaping Use: Never used  Substance and Sexual Activity   Alcohol use: Not Currently    Alcohol/week: 0.0 standard drinks    Comment: no alchol in 1 year   Drug use: No   Sexual activity: Not on file  Other Topics Concern   Not on file  Social History Narrative   Not on file   Social Determinants of Health   Financial Resource Strain: Not on file  Food Insecurity: Not on file  Transportation Needs: Not on file  Physical Activity: Not on file  Stress: Not on file  Social Connections: Not on file  Intimate Partner Violence: Not on file    FAMILY HISTORY: Family History  Problem Relation Age of Onset   Diabetes Mother    Cancer Mother        neuroendocrine cancer   Cancer Father        neuroendocrine cancer of meninges   Cancer Maternal Grandmother        tomach dx 77   Alzheimer's disease Paternal Grandmother    Lung cancer Paternal Grandfather    Lung cancer Maternal Aunt    Colon cancer Maternal Uncle    Breast cancer Paternal Aunt        both dx 63s   Ovarian cancer Other        dx 13   Breast cancer Cousin     ALLERGIES:  is allergic to codeine and mucinex [guaifenesin er].  MEDICATIONS:  Current Outpatient Medications  Medication Sig Dispense Refill   ACCU-CHEK AVIVA PLUS test strip CHECK BL00D SUGAR ONCE OR TWICE  DAILY. 100 each PRN   calcium carbonate (OS-CAL - DOSED IN MG OF ELEMENTAL CALCIUM) 1250 (500 Ca) MG tablet Take 1 tablet by mouth.     carvedilol (COREG) 25 MG tablet Take 1 tablet (25 mg total) by mouth 2 (two) times daily with a meal. 180 tablet 3   Cholecalciferol (VITAMIN D3) 125 MCG (5000 UT) CAPS Take 5,000 Units by mouth daily.     furosemide (LASIX) 40 MG tablet Take 1 tablet (40 mg total) by mouth daily. 30 tablet 3   glipiZIDE (GLUCOTROL) 5 MG tablet Take 5 mg by mouth daily. Tablet(s) By Mouth Daily     levothyroxine (SYNTHROID) 25 MCG tablet Take 1 tablet (25 mcg total) by mouth daily before breakfast. 30 tablet  3   loperamide (IMODIUM A-D) 2 MG tablet Take 2 mg by mouth 4 (four) times daily as needed for diarrhea or loose stools.     metFORMIN (GLUCOPHAGE) 500 MG tablet Take 1,000 mg by mouth 2 (two) times daily. 2 Tablet(s) By Mouth Twice Daily     pravastatin (PRAVACHOL) 20 MG tablet Take 1 tablet (20 mg total) by mouth every evening. 30 tablet 11   prochlorperazine (COMPAZINE) 10 MG tablet Take 1 tablet (10 mg total) by mouth every 6 (six) hours as needed for nausea or vomiting. 30 tablet 0   promethazine (PHENERGAN) 25 MG tablet Take 1 tablet (25 mg total) by mouth every 6 (six) hours as needed for nausea or vomiting. 30 tablet 0   rifaximin (XIFAXAN) 550 MG TABS tablet Take 1 tablet (550 mg total) by mouth 2 (two) times daily. 60 tablet 11   spironolactone (ALDACTONE) 100 MG tablet Take 1 tablet (100 mg total) by mouth daily. 30 tablet 1   spironolactone (ALDACTONE) 100 MG tablet Take 1 tablet (100 mg total) by mouth daily. 30 tablet 3   vitamin B-12 (CYANOCOBALAMIN) 1000 MCG tablet Take 1 tablet (1,000 mcg total) by mouth daily. 90 tablet 0   fentaNYL (DURAGESIC) 12 MCG/HR Place 1 patch onto the skin every 3 (three) days. (Patient not taking: Reported on 09/19/2021) 5 patch 0   Current Facility-Administered Medications  Medication Dose Route Frequency Provider Last Rate Last  Admin   albumin human 25 % solution 25 g  25 g Intravenous Once [name], MD       Facility-Administered Medications Ordered in Other Visits  Medication Dose Route Frequency Provider Last Rate Last Admin   heparin lock flush 100 UNIT/ML injection            sodium chloride flush (NS) 0.9 % injection 10 mL  10 mL Intravenous PRN Earlie Server, MD   10 mL at 01/22/20 0831     PHYSICAL EXAMINATION: ECOG PERFORMANCE STATUS: 1 - Symptomatic but completely ambulatory Vitals:   10/03/21 0859  BP: 90/60  Pulse: 77  Resp: 16  Temp: 97.6 F (36.4 C)  SpO2: 99%   Filed Weights   10/03/21 0859  Weight: 214 lb 12.8 oz (97.4 kg)    Physical Exam Constitutional:      General: He is not in acute distress.    Appearance: He is obese.  HENT:     Head: Normocephalic and atraumatic.  Eyes:     General: No scleral icterus. Cardiovascular:     Rate and Rhythm: Normal rate and regular rhythm.     Heart sounds: Normal heart sounds.  Pulmonary:     Effort: Pulmonary effort is normal. No respiratory distress.     Breath sounds: No wheezing.  Abdominal:     General: Bowel sounds are normal. There is no distension.     Palpations: Abdomen is soft.  Musculoskeletal:        General: No deformity. Normal range of motion.     Cervical back: Normal range of motion and neck supple.  Skin:    General: Skin is warm and dry.     Findings: No erythema or rash.  Neurological:     Mental Status: He is alert and oriented to person, place, and time. Mental status is at baseline.     Cranial Nerves: No cranial nerve deficit.     Coordination: Coordination normal.  Psychiatric:        Mood and Affect: Mood normal.  LABORATORY DATA:  I have reviewed the data as listed Lab Results  Component Value Date   WBC 3.1 (L) 10/03/2021   HGB 11.1 (L) 10/03/2021   HCT 31.2 (L) 10/03/2021   MCV 91.8 10/03/2021   PLT 58 (L) 10/03/2021   Recent Labs    04/14/21 1656 04/15/21 1118 09/01/21 0923  09/19/21 0837 10/03/21 0836  NA 132*   < > 130* 133* 132*  K 5.1   < > 3.7 4.4 4.2  CL 105   < > 99 103 99  CO2 21*   < > 25 23 22   GLUCOSE 140*   < > 159* 155* 140*  BUN 32*   < > 50* 53* 67*  CREATININE 1.75*   < > 2.09* 2.12* 2.46*  CALCIUM 8.5*   < > 9.2 9.4 9.8  GFRNONAA 45*   < > 36* 35* 30*  PROT 6.4*   < > 7.8 7.8 8.3*  ALBUMIN 2.9*   < > 4.0 3.9 4.0  AST 36   < > 36 45* 38  ALT 23   < > 24 36 26  ALKPHOS 86   < > 116 145* 106  BILITOT 1.0   < > 0.9 0.5 0.9  BILIDIR 0.2  --   --   --   --   IBILI 0.8  --   --   --   --    < > = values in this interval not displayed.    Iron/TIBC/Ferritin/ %Sat    Component Value Date/Time   IRON 60 09/02/2020 0822   IRON 105 10/15/2018 1325   TIBC 293 09/02/2020 0822   TIBC 244 (L) 10/15/2018 1325   FERRITIN 164 09/02/2020 0822   FERRITIN 588 (H) 10/15/2018 1325   IRONPCTSAT 21 09/02/2020 0822   IRONPCTSAT 43 10/15/2018 1325      RADIOGRAPHIC STUDIES: I have personally reviewed the radiological images as listed and agreed with the findings in the report. CT CHEST ABDOMEN PELVIS WO CONTRAST  Result Date: 09/15/2021 CLINICAL DATA:  Metastatic renal cell carcinoma restaging EXAM: CT CHEST, ABDOMEN AND PELVIS WITHOUT CONTRAST TECHNIQUE: Multidetector CT imaging of the chest, abdomen and pelvis was performed following the standard protocol without IV contrast. Oral enteric contrast was administered. RADIATION DOSE REDUCTION: This exam was performed according to the departmental dose-optimization program which includes automated exposure control, adjustment of the mA and/or kV according to patient size and/or use of iterative reconstruction technique. COMPARISON:  05/30/2021 FINDINGS: CT CHEST FINDINGS Cardiovascular: Right chest port catheter. Aortic atherosclerosis. Aortic valve calcifications. Normal heart size. Extensive three vessel coronary artery calcifications. No pericardial effusion. Mediastinum/Nodes: No enlarged mediastinal,  hilar, or axillary lymph nodes. Thyroid gland, trachea, and esophagus demonstrate no significant findings. Lungs/Pleura: Unchanged 0.8 cm nodule of the lingula (series 3, image 82). Unchanged 0.4 cm nodule of the right lower lobe (series 3, image 96). Unchanged 0.3 cm nodule just anteriorly in the right lower lobe (series 3, image 96). Slightly enlarged 0.8 cm subpleural nodule of the left lower lobe posterior to the left hilum, previously 0.5 cm (series 3, image 67). No pleural effusion or pneumothorax. Musculoskeletal: No chest wall mass. Unchanged lytic osseous lesion of the T9 vertebral body with redemonstrated bridging fusion of T7 through T11. CT ABDOMEN PELVIS FINDINGS Hepatobiliary: Coarse, nodular, cirrhotic morphology of the liver. No gallstones, gallbladder wall thickening, or biliary dilatation. Pancreas: Unremarkable. No pancreatic ductal dilatation or surrounding inflammatory changes. Spleen: Gross splenomegaly, maximum coronal span 21.1 cm. Adrenals/Urinary  Tract: Adrenal glands are unremarkable. Status post left nephrectomy. The right kidney is normal. No urinary tract calculi, hydronephrosis, or obvious solid lesion. Bladder is unremarkable. Stomach/Bowel: Stomach is within normal limits. Appendix appears normal. No evidence of bowel wall thickening, distention, or inflammatory changes. Vascular/Lymphatic: Aortic atherosclerosis. Unchanged prominent retroperitoneal lymph nodes and adjacent fat stranding, largest left retroperitoneal nodes measuring 1.1 x 0.9 cm (series 2, image 72). Reproductive: No mass or other abnormality. Penile implant and left lower quadrant reservoir. Other: No abdominal wall hernia or abnormality. Trace perihepatic ascites. Musculoskeletal: No acute osseous findings. Unchanged tiny lucent lesion of the inferior endplate of L3 (series 5, image 105). IMPRESSION: 1. Slightly enlarged 0.8 cm subpleural nodule of the left lower lobe, previously 0.5 cm, highly concerning for an  enlarging metastasis. 2. Other small nodules are unchanged. 3. Unchanged lytic metastatic lesion of the T9 vertebral body with bridging fusion of the lower thoracic spine. 4. Unchanged tiny lucent lesion of the inferior endplate of L3, nonspecific but modestly suspicious for an additional small osseous metastasis. Attention on follow-up. 5. Unchanged prominent retroperitoneal lymph nodes and adjacent fat stranding, nonspecific in the setting of cirrhosis. Attention on follow-up. 6. No noncontrast evidence of new soft tissue metastatic disease in the abdomen or pelvis. 7. Status post left nephrectomy. 8. Stigmata of cirrhosis and portal hypertension with trace perihepatic ascites. 9. Coronary artery disease. Aortic Atherosclerosis (ICD10-I70.0). Electronically Signed   By: Delanna Ahmadi M.D.   On: 09/15/2021 10:26      ASSESSMENT & PLAN:  1. Renal cell carcinoma of left kidney (HCC)   2. Thrombocytopenia ([place])   3. Encounter for antineoplastic immunotherapy   4. Cirrhosis of liver with ascites, unspecified hepatic cirrhosis type ([place])   5. Chronic kidney disease, stage 3b ([place])   6. 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 Labs reviewed and discussed with patient Proceed with nivolumab today.  #Hypotension, I advised patient to monitor blood pressure at home.  If systolic BP is less than 412, he should hold Coreg. Patient will be given IV fluid normal saline 500 cc today.  # T9 lesion, s/p resection and spine fusion.  Pathology is positive for RCC. Last Xgeva 06/16/2021.  We will resume Xgeva today.  Continue calcium supplementation.  #  liver cirrhosis, recurrent ascites, symptoms have improved.  Continue follow-up with GI.  Continue Lasix and spironolactone.  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 #Chronic kidney disease, Encourage oral hydration and  avoid nephrotoxin.  # Normal TSH, slightly elevated T4 level.  Off synthroid.    Follow-up in 2 weeks.  All questions were answered. The patient knows to call the clinic with any problems questions or concerns.   Earlie Server, MD, PhD 10/03/2021

## 2021-10-07 ENCOUNTER — Encounter: Payer: Self-pay | Admitting: Oncology

## 2021-10-07 NOTE — Addendum Note (Signed)
Addended by: Earlie Server on: 10/07/2021 09:44 PM ? ? Modules accepted: Orders ? ?

## 2021-10-11 ENCOUNTER — Encounter: Payer: Self-pay | Admitting: Oncology

## 2021-10-17 ENCOUNTER — Other Ambulatory Visit: Payer: Self-pay | Admitting: Gastroenterology

## 2021-10-18 ENCOUNTER — Inpatient Hospital Stay: Payer: Medicare Other

## 2021-10-18 ENCOUNTER — Inpatient Hospital Stay (HOSPITAL_BASED_OUTPATIENT_CLINIC_OR_DEPARTMENT_OTHER): Payer: Medicare Other | Admitting: Oncology

## 2021-10-18 ENCOUNTER — Other Ambulatory Visit: Payer: Self-pay

## 2021-10-18 ENCOUNTER — Encounter: Payer: Self-pay | Admitting: Oncology

## 2021-10-18 VITALS — BP 145/88 | HR 98 | Temp 96.6°F | Wt 216.0 lb

## 2021-10-18 DIAGNOSIS — M899 Disorder of bone, unspecified: Secondary | ICD-10-CM

## 2021-10-18 DIAGNOSIS — D696 Thrombocytopenia, unspecified: Secondary | ICD-10-CM

## 2021-10-18 DIAGNOSIS — C649 Malignant neoplasm of unspecified kidney, except renal pelvis: Secondary | ICD-10-CM

## 2021-10-18 DIAGNOSIS — N1832 Chronic kidney disease, stage 3b: Secondary | ICD-10-CM

## 2021-10-18 DIAGNOSIS — K7460 Unspecified cirrhosis of liver: Secondary | ICD-10-CM

## 2021-10-18 DIAGNOSIS — C642 Malignant neoplasm of left kidney, except renal pelvis: Secondary | ICD-10-CM

## 2021-10-18 DIAGNOSIS — Z5112 Encounter for antineoplastic immunotherapy: Secondary | ICD-10-CM

## 2021-10-18 DIAGNOSIS — R188 Other ascites: Secondary | ICD-10-CM

## 2021-10-18 LAB — COMPREHENSIVE METABOLIC PANEL
ALT: 26 U/L (ref 0–44)
AST: 37 U/L (ref 15–41)
Albumin: 4.1 g/dL (ref 3.5–5.0)
Alkaline Phosphatase: 127 U/L — ABNORMAL HIGH (ref 38–126)
Anion gap: 11 (ref 5–15)
BUN: 53 mg/dL — ABNORMAL HIGH (ref 6–20)
CO2: 22 mmol/L (ref 22–32)
Calcium: 9.3 mg/dL (ref 8.9–10.3)
Chloride: 97 mmol/L — ABNORMAL LOW (ref 98–111)
Creatinine, Ser: 2.41 mg/dL — ABNORMAL HIGH (ref 0.61–1.24)
GFR, Estimated: 30 mL/min — ABNORMAL LOW (ref >60)
Glucose, Bld: 164 mg/dL — ABNORMAL HIGH (ref 70–99)
Potassium: 4 mmol/L (ref 3.5–5.1)
Sodium: 130 mmol/L — ABNORMAL LOW (ref 135–145)
Total Bilirubin: 1.1 mg/dL (ref 0.3–1.2)
Total Protein: 8.2 g/dL — ABNORMAL HIGH (ref 6.5–8.1)

## 2021-10-18 LAB — T4, FREE: Free T4: 1.15 ng/dL — ABNORMAL HIGH (ref 0.61–1.12)

## 2021-10-18 LAB — CBC WITH DIFFERENTIAL/PLATELET
Abs Immature Granulocytes: 0.03 10*3/uL (ref 0.00–0.07)
Basophils Absolute: 0 10*3/uL (ref 0.0–0.1)
Basophils Relative: 1 %
Eosinophils Absolute: 0.2 10*3/uL (ref 0.0–0.5)
Eosinophils Relative: 6 %
HCT: 31.7 % — ABNORMAL LOW (ref 39.0–52.0)
Hemoglobin: 11.1 g/dL — ABNORMAL LOW (ref 13.0–17.0)
Immature Granulocytes: 1 %
Lymphocytes Relative: 9 %
Lymphs Abs: 0.3 10*3/uL — ABNORMAL LOW (ref 0.7–4.0)
MCH: 32.5 pg (ref 26.0–34.0)
MCHC: 35 g/dL (ref 30.0–36.0)
MCV: 92.7 fL (ref 80.0–100.0)
Monocytes Absolute: 0.5 10*3/uL (ref 0.1–1.0)
Monocytes Relative: 13 %
Neutro Abs: 2.6 10*3/uL (ref 1.7–7.7)
Neutrophils Relative %: 70 %
Platelets: 65 10*3/uL — ABNORMAL LOW (ref 150–400)
RBC: 3.42 MIL/uL — ABNORMAL LOW (ref 4.22–5.81)
RDW: 14.2 % (ref 11.5–15.5)
WBC: 3.6 10*3/uL — ABNORMAL LOW (ref 4.0–10.5)
nRBC: 0 % (ref 0.0–0.2)

## 2021-10-18 LAB — TSH: TSH: 3.819 u[IU]/mL (ref 0.350–4.500)

## 2021-10-18 MED ORDER — SODIUM CHLORIDE 0.9 % IV SOLN
Freq: Once | INTRAVENOUS | Status: AC
  Filled 2021-10-18: qty 250

## 2021-10-18 MED ORDER — SODIUM CHLORIDE 0.9% FLUSH
10.0000 mL | Freq: Once | INTRAVENOUS | Status: AC
  Administered 2021-10-18: 10 mL via INTRAVENOUS
  Filled 2021-10-18: qty 10

## 2021-10-18 MED ORDER — HEPARIN SOD (PORK) LOCK FLUSH 100 UNIT/ML IV SOLN
500.0000 [IU] | Freq: Once | INTRAVENOUS | Status: DC | PRN
  Filled 2021-10-18: qty 5

## 2021-10-18 MED ORDER — SODIUM CHLORIDE 0.9 % IV SOLN
240.0000 mg | Freq: Once | INTRAVENOUS | Status: AC
  Administered 2021-10-18: 240 mg via INTRAVENOUS
  Filled 2021-10-18: qty 24

## 2021-10-18 MED ORDER — HEPARIN SOD (PORK) LOCK FLUSH 100 UNIT/ML IV SOLN
INTRAVENOUS | Status: AC
  Filled 2021-10-18: qty 5

## 2021-10-18 NOTE — Progress Notes (Signed)
?Hematology/Oncology follow-up ?[phone]  ? ? ?Patient Care Team: ?Albina Billet, MD as PCP - General (Internal Medicine) ?Earlie Server, MD as Consulting Physician (Hematology and Oncology) ? ?REFERRING PROVIDER: ?Albina Billet, MD  ?CHIEF COMPLAINTS/REASON FOR VISIT:  ?Follow-up for kidney cancer treatments ? ?HISTORY OF PRESENTING ILLNESS:  ? ?Henry Moore is a  61 y.o.  male with PMH listed below was seen in consultation at the request of  Albina Billet, MD  for evaluation of kidney cancer ?Extensive medical records were reviewed ?Patient had MRI lumbar spine without contrast done on 07/13/2018 which showed mired lumbar degenerative disease. ?CT thoracic spine was done on 08/05/2018 which showed 5 cm left renal mass. ?09/04/2018 CT abdomen and pelvis with and without contrast showed 5 cm round enhancing solid mass in the left kidney.  No involvement of left renal vein.  No lymphadenopathy ?Morphologic changes consistent with cirrhosis and the portal hypertension.  Small volume ascites and splenomegaly. ?. ?10/16/2018 ?Left nephrectomy showed renal cell carcinoma, clear-cell type, nuclear grade 4, tumor extends into the renal vein and renal sinus fat.  Urethral, vascular and or resection margins are negative for tumor pT3a Nx Mx ? ?01/28/2019 patient had another CT chest abdomen pelvis done with contrast ?Showed interval left nephrectomy.  Scattered tiny pulmonary nodules bilaterally.  Nonspecific.  No other evidence of metastatic disease.  Cirrhosis changes extensive coronary and aortic atherosclerosis ? ?07/14/2019 patient underwent surveillance CT chest abdomen pelvis without contrast ?Interval progression of pulmonary metastasis with new enlarged right paratracheal lymph node 1.7 cm, previously 0.6 cm one-point since worrisome for metastatic adenopathy.  Multiple progressive pulmonary nodules, index nodule within the lingula measures 1.3 cm, previously 3 mm. ?Index subpleural nodule within the anterior  right upper lobe measuring 1.8 cm, previously 3 cm ?Index nodule within the post lateral right lower lobe measuring 5 mm, new from previous care ?Aortic sclerosis.  Three-vessel coronary artery calcification noted.  Cirrhosis changes.  Splenomegaly. ? ?Patient was referred to establish care with medical oncology for further discussion and evaluation of metastatic renal cell carcinoma. ?Patient reports feeling well at baseline today.  Denies any shortness of breath, cough, hemoptysis, back pain, headache. ?He quitted smoking 2015.  Not currently actively drinking alcohol as well. ?Patient has chronic back pain unchanged. ? ?#08/14/2019-10/16/2019 nivolumab and ipilimumab. ?#11/06/2019-nivolumab every 2 weeks maintenance. ? ?# Liver cirrhosis with small amount of ascites/splenomegaly patient sees gastroenterology Dr. [name].  . He will get ultrasound surveillance due in February 2021 for screening of [place].  EGD surveillance for Barrett's plan in April 2021. ? ?# Diabetes, on Humalog sliding scale and metformin.  ?#Family history of cancer and personal history of RCC.  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  ? 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  Status post ipilimumab and nivolumab treatment. ? ?# 10/29/2019 CT chest abdomen pelvis without contrast images were independently reviewed by me and discussed with patient.   CT showed marked improvement with resolution of many of the pulmonary nodules, prominent reduced size of other nodules.  Considerably reduced size of the right lower paratracheal lymph node now 0.9 cm in diameter. ?Hepatic cirrhosis with prominent splenomegaly and trace perisplenic ascites. ?Chronic CT findings includes aortic atherosclerotic vascular disease.  Mild sigmoid colon diverticulosis ? ?# CKD,  he establish care with Dr. Candiss Norse  for chronic kidney disease.  Blood pressure medication has been adjusted.  Hydrochlorothiazide decreased to 12.5 mg daily. ? ?# NGS -foundation one PD-L1 TPS 0% no reportable mutation, MS stable. 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  ? ?# 07/16/2020, CT chest abdomen pelvis images were reviewed and discussed with patient ?Compared to July 2021 image, he has had a new patchy groundglass opacities throughout both lungs with central part of solids/airspace components.  Findings are nonspecific and could be secondary to an atypical infection, including viral pneumonia or inflammation.  Patient is asymptomatic.  I wonder the changes are secondary to his Covid infection.  Patient has been started on Levaquin to cover atypical infection by on-call physician and I recommend him to continue and complete the course. ?Proceed with today's immunotherapy nivolumab.  Advised patient to notify me if his symptoms get worse. ? ?# 10/12/2020 CT chest abdomen pelvis with out contrast. ?New 15 mm lytic lesion in the right T9 vertebral body.  Suspicious for isolated lytic metastasis.  No findings of suspicious soft tissue metastasis in the chest abdomen or pelvis.  Residual postinfectious inflammatory scarring in the lung bilaterally.  Cirrhosis.  Splenomegaly.  Minimal ascites. ? ?# 10/26/20 Bone scan showed Minimal increased activity noted at the right costovertebral junction at the T9 level is most likely degenerative. Similar finding noted on prior bone scan of 08/11/2019. No definite T9 vertebral body abnormal activity corresponding to lytic lesion noted on recent CT. Again the recently identified T9 vertebral body lytic lesion is suspicious and further evaluation of the thoracic spine with MRI can be obtained. No other bony abnormalities noted by bone scan. ?I discussed patient's case on tumor board 11/04/2020.  Bone scan probably is not sensitive in detecting lytic  lesion from [place]  Consensus reached upon clinically based on the CT scan, new bone lesion is highly likely metastasis from [place]  Options including proceed with bone lesion biopsy versus referral to radiation oncology for empiric palliative radiation.  Patient has other comorbidities including cirrhosis/pancytopenia, patient opts to proceed with empiric palliative radiation. ? ?12/13/2020 finished RT to T9 ?June 2022 abscess of anal area , s/ drainage and antibiotics. ?July 2022, immunotherapy was held due to diarrhea. ? ?02/01/2021  mild wall thickening of aseconding colon. Small infraumbilical ventral hernia,contains short segment of small bowel. Cirrohsis and splenomegaly. Small volume ascites, slight increase ?02/03/21 chest wo contrast showed  Lytic lesion within T9 slightly increased in size., no new skeletal metastasis. Mild peripheral ground glass densities.  ? ?03/18/2021, resumed on nivolumab treatment x1. ?04/01/2021, hold nivolumab due to diarrhea and upcoming surgery. ?# 04/08/2021 paracentesis. Cytology negative for malignancy.  ?# [facility]14[facility]2021[facility] ?S/p paracentesis on 04/15/2021 , cytology negative.  ?# 04/22/21 right T9 transpedicular mass resection. [facility]?# 05/06/2021 abdominal distention. S/p repeat therapeutic paracentesis.  ? ?05/30/2021, CT chest abdomen pelvis showed 8 mm left upper lobe pulmonary nodule, increased size compared to 2 mm previously.  Interval progression highly concerning for metastatic disease.  Primary bronchogenic neoplasm is also another consideration. ?Interval thoracic fusion above and below the T10.  Previously described tiny lucency inferior L3 vertebral body is stable.  Cirrhosis with splenomegaly and small volume ascites.  Cholelithiasis.  Aortic atherosclerosis ? ?#08/12/2021, status post stereotactic radiation to lung ? ?09/14/2021, CT chest abdomen pelvis without contrast showed 0.8  cm subpleural nodule of the left lower lobe, previously 0.5 cm.  Other small nodules are unchanged.  Unchanged lytic lesion of T9 with bridging fusion of the lower thoracic spine.  Unchanged tiny lucent

## 2021-10-18 NOTE — Patient Instructions (Signed)
[facility]  Discharge Instructions: °Thank you for choosing [facility] to provide your oncology and hematology care.  °If you have a lab appointment with the Cancer Center, please go directly to the Cancer Center and check in at the registration area. ° °Wear comfortable clothing and clothing appropriate for easy access to any Portacath or PICC line.  ° °We strive to give you quality time with your provider. You may need to reschedule your appointment if you arrive late (15 or more minutes).  Arriving late affects you and other patients whose appointments are after yours.  Also, if you miss three or more appointments without notifying the office, you may be dismissed from the clinic at the provider’s discretion.    °  °For prescription refill requests, have your pharmacy contact our office and allow 72 hours for refills to be completed.   ° °Today you received the following chemotherapy and/or immunotherapy agents     °  °To help prevent nausea and vomiting after your treatment, we encourage you to take your nausea medication as directed. ° °BELOW ARE SYMPTOMS THAT SHOULD BE REPORTED IMMEDIATELY: °*FEVER GREATER THAN 100.4 F (38 °C) OR HIGHER °*CHILLS OR SWEATING °*NAUSEA AND VOMITING THAT IS NOT CONTROLLED WITH YOUR NAUSEA MEDICATION °*UNUSUAL SHORTNESS OF BREATH °*UNUSUAL BRUISING OR BLEEDING °*URINARY PROBLEMS (pain or burning when urinating, or frequent urination) °*BOWEL PROBLEMS (unusual diarrhea, constipation, pain near the anus) °TENDERNESS IN MOUTH AND THROAT WITH OR WITHOUT PRESENCE OF ULCERS (sore throat, sores in mouth, or a toothache) °UNUSUAL RASH, SWELLING OR PAIN  °UNUSUAL VAGINAL DISCHARGE OR ITCHING  ° °Items with * indicate a potential emergency and should be followed up as soon as possible or go to the Emergency Department if any problems should occur. ° °Please show the CHEMOTHERAPY ALERT CARD or IMMUNOTHERAPY ALERT CARD at check-in to the  Emergency Department and triage nurse. ° °Should you have questions after your visit or need to cancel or reschedule your appointment, please contact [facility]  [phone] and follow the prompts.  Office hours are 8:00 a.m. to 4:30 p.m. Monday - Friday. Please note that voicemails left after 4:00 p.m. may not be returned until the following business day.  We are closed weekends and major holidays. You have access to a nurse at all times for urgent questions. Please call the main number to the clinic [phone] and follow the prompts. ° °For any non-urgent questions, you may also contact your provider using MyChart. We now offer e-Visits for anyone 18 and older to request care online for non-urgent symptoms. For details visit [email or web address]. °  °Also download the MyChart app! Go to the app store, search "MyChart", open the app, select [facility], and log in with your MyChart username and password. ° °Due to Covid, a mask is required upon entering the hospital/clinic. If you do not have a mask, one will be given to you upon arrival. For doctor visits, patients may have 1 support person aged 18 or older with them. For treatment visits, patients cannot have anyone with them due to current Covid guidelines and our immunocompromised population.  °

## 2021-10-25 ENCOUNTER — Inpatient Hospital Stay: Payer: Medicare Other

## 2021-10-31 ENCOUNTER — Inpatient Hospital Stay: Payer: BC Managed Care – PPO | Attending: Hospice and Palliative Medicine | Admitting: Hospice and Palliative Medicine

## 2021-10-31 DIAGNOSIS — Z800 Family history of malignant neoplasm of digestive organs: Secondary | ICD-10-CM | POA: Insufficient documentation

## 2021-10-31 DIAGNOSIS — R161 Splenomegaly, not elsewhere classified: Secondary | ICD-10-CM | POA: Insufficient documentation

## 2021-10-31 DIAGNOSIS — K8020 Calculus of gallbladder without cholecystitis without obstruction: Secondary | ICD-10-CM | POA: Insufficient documentation

## 2021-10-31 DIAGNOSIS — Z87891 Personal history of nicotine dependence: Secondary | ICD-10-CM | POA: Insufficient documentation

## 2021-10-31 DIAGNOSIS — C7951 Secondary malignant neoplasm of bone: Secondary | ICD-10-CM | POA: Insufficient documentation

## 2021-10-31 DIAGNOSIS — D696 Thrombocytopenia, unspecified: Secondary | ICD-10-CM | POA: Insufficient documentation

## 2021-10-31 DIAGNOSIS — G8929 Other chronic pain: Secondary | ICD-10-CM | POA: Insufficient documentation

## 2021-10-31 DIAGNOSIS — Z7984 Long term (current) use of oral hypoglycemic drugs: Secondary | ICD-10-CM | POA: Insufficient documentation

## 2021-10-31 DIAGNOSIS — Z5112 Encounter for antineoplastic immunotherapy: Secondary | ICD-10-CM | POA: Insufficient documentation

## 2021-10-31 DIAGNOSIS — Z801 Family history of malignant neoplasm of trachea, bronchus and lung: Secondary | ICD-10-CM | POA: Insufficient documentation

## 2021-10-31 DIAGNOSIS — Z794 Long term (current) use of insulin: Secondary | ICD-10-CM | POA: Insufficient documentation

## 2021-10-31 DIAGNOSIS — Z79899 Other long term (current) drug therapy: Secondary | ICD-10-CM | POA: Insufficient documentation

## 2021-10-31 DIAGNOSIS — C642 Malignant neoplasm of left kidney, except renal pelvis: Secondary | ICD-10-CM | POA: Diagnosis not present

## 2021-10-31 DIAGNOSIS — E785 Hyperlipidemia, unspecified: Secondary | ICD-10-CM | POA: Insufficient documentation

## 2021-10-31 DIAGNOSIS — R140 Abdominal distension (gaseous): Secondary | ICD-10-CM | POA: Insufficient documentation

## 2021-10-31 DIAGNOSIS — Z803 Family history of malignant neoplasm of breast: Secondary | ICD-10-CM | POA: Insufficient documentation

## 2021-10-31 DIAGNOSIS — Z8616 Personal history of COVID-19: Secondary | ICD-10-CM | POA: Insufficient documentation

## 2021-10-31 DIAGNOSIS — Z923 Personal history of irradiation: Secondary | ICD-10-CM | POA: Insufficient documentation

## 2021-10-31 DIAGNOSIS — I129 Hypertensive chronic kidney disease with stage 1 through stage 4 chronic kidney disease, or unspecified chronic kidney disease: Secondary | ICD-10-CM | POA: Insufficient documentation

## 2021-10-31 DIAGNOSIS — I2510 Atherosclerotic heart disease of native coronary artery without angina pectoris: Secondary | ICD-10-CM | POA: Insufficient documentation

## 2021-10-31 DIAGNOSIS — Z79890 Hormone replacement therapy (postmenopausal): Secondary | ICD-10-CM | POA: Insufficient documentation

## 2021-10-31 DIAGNOSIS — E1122 Type 2 diabetes mellitus with diabetic chronic kidney disease: Secondary | ICD-10-CM | POA: Insufficient documentation

## 2021-10-31 DIAGNOSIS — K766 Portal hypertension: Secondary | ICD-10-CM | POA: Insufficient documentation

## 2021-10-31 DIAGNOSIS — I700 Atherosclerosis of aorta: Secondary | ICD-10-CM | POA: Insufficient documentation

## 2021-10-31 DIAGNOSIS — C7800 Secondary malignant neoplasm of unspecified lung: Secondary | ICD-10-CM | POA: Insufficient documentation

## 2021-10-31 DIAGNOSIS — K7469 Other cirrhosis of liver: Secondary | ICD-10-CM | POA: Insufficient documentation

## 2021-10-31 DIAGNOSIS — K439 Ventral hernia without obstruction or gangrene: Secondary | ICD-10-CM | POA: Insufficient documentation

## 2021-10-31 DIAGNOSIS — R188 Other ascites: Secondary | ICD-10-CM | POA: Insufficient documentation

## 2021-10-31 NOTE — Progress Notes (Signed)
Virtual Visit via telephone note ? ?I connected with Henry Moore on 10/31/21 at  2:00 PM EDT by telephone and verified that I am speaking with the correct person using two identifiers. ?  ?I discussed the limitations, risks, security and privacy concerns of performing an evaluation and management service by telephone and the availability of in person appointments. I also discussed with the patient that there may be a patient responsible charge related to this service. The patient expressed understanding and agreed to proceed. ? ? ?History of Present Illness: ?Henry Moore is a 61 y.o. 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  patient was referred to palliative care to help address goals and manage ongoing symptoms. ?  ?Observations/Objective: ?I called and spoke with patient by phone.  ? ?Patient reports he is doing well.  He denies any significant changes or concerns.  No symptomatic complaints at present.  Overall, he feels like he is doing better.  He says that he was able to make some sausage yesterday for the first time in a while. ? ?Pain is reportedly stable. ? ?No issues with medications nor need for refills. ? ?Assessment and Plan: ?RCC -on immunotherapy.  Seems to be doing well symptomatically.  We will follow ? ?Neoplasm related pain -stable ? ?Follow Up Instructions: ?Follow-up telephone visit 2 to 3 months ?  ?I discussed the assessment and treatment plan with the patient. The patient was provided an opportunity to ask questions and all were answered. The patient agreed with the plan and demonstrated an understanding of the instructions. ?  ?The patient was advised to call back or seek an in-person evaluation if the symptoms worsen or if the condition fails to improve as anticipated. ? ?I provided 5 minutes of non-face-to-face time during this encounter. ? ? ?Irean Hong,  NP ? ? ?

## 2021-11-01 ENCOUNTER — Inpatient Hospital Stay (HOSPITAL_BASED_OUTPATIENT_CLINIC_OR_DEPARTMENT_OTHER): Payer: BC Managed Care – PPO | Admitting: Oncology

## 2021-11-01 ENCOUNTER — Encounter: Payer: Self-pay | Admitting: Oncology

## 2021-11-01 ENCOUNTER — Inpatient Hospital Stay: Payer: BC Managed Care – PPO

## 2021-11-01 VITALS — BP 102/69 | HR 80 | Temp 96.5°F | Resp 16 | Wt 218.9 lb

## 2021-11-01 DIAGNOSIS — Z794 Long term (current) use of insulin: Secondary | ICD-10-CM | POA: Diagnosis not present

## 2021-11-01 DIAGNOSIS — C642 Malignant neoplasm of left kidney, except renal pelvis: Secondary | ICD-10-CM

## 2021-11-01 DIAGNOSIS — N1832 Chronic kidney disease, stage 3b: Secondary | ICD-10-CM | POA: Diagnosis not present

## 2021-11-01 DIAGNOSIS — Z803 Family history of malignant neoplasm of breast: Secondary | ICD-10-CM | POA: Diagnosis not present

## 2021-11-01 DIAGNOSIS — E1122 Type 2 diabetes mellitus with diabetic chronic kidney disease: Secondary | ICD-10-CM | POA: Diagnosis not present

## 2021-11-01 DIAGNOSIS — G8929 Other chronic pain: Secondary | ICD-10-CM | POA: Diagnosis not present

## 2021-11-01 DIAGNOSIS — D696 Thrombocytopenia, unspecified: Secondary | ICD-10-CM | POA: Diagnosis not present

## 2021-11-01 DIAGNOSIS — Z79899 Other long term (current) drug therapy: Secondary | ICD-10-CM | POA: Diagnosis not present

## 2021-11-01 DIAGNOSIS — K8020 Calculus of gallbladder without cholecystitis without obstruction: Secondary | ICD-10-CM | POA: Diagnosis not present

## 2021-11-01 DIAGNOSIS — K439 Ventral hernia without obstruction or gangrene: Secondary | ICD-10-CM | POA: Diagnosis not present

## 2021-11-01 DIAGNOSIS — K7469 Other cirrhosis of liver: Secondary | ICD-10-CM

## 2021-11-01 DIAGNOSIS — Z5112 Encounter for antineoplastic immunotherapy: Secondary | ICD-10-CM | POA: Diagnosis not present

## 2021-11-01 DIAGNOSIS — R188 Other ascites: Secondary | ICD-10-CM | POA: Diagnosis not present

## 2021-11-01 DIAGNOSIS — I2510 Atherosclerotic heart disease of native coronary artery without angina pectoris: Secondary | ICD-10-CM | POA: Diagnosis not present

## 2021-11-01 DIAGNOSIS — Z8616 Personal history of COVID-19: Secondary | ICD-10-CM | POA: Diagnosis not present

## 2021-11-01 DIAGNOSIS — C7951 Secondary malignant neoplasm of bone: Secondary | ICD-10-CM | POA: Diagnosis not present

## 2021-11-01 DIAGNOSIS — C649 Malignant neoplasm of unspecified kidney, except renal pelvis: Secondary | ICD-10-CM

## 2021-11-01 DIAGNOSIS — R140 Abdominal distension (gaseous): Secondary | ICD-10-CM | POA: Diagnosis not present

## 2021-11-01 DIAGNOSIS — R161 Splenomegaly, not elsewhere classified: Secondary | ICD-10-CM | POA: Diagnosis not present

## 2021-11-01 DIAGNOSIS — I700 Atherosclerosis of aorta: Secondary | ICD-10-CM | POA: Diagnosis not present

## 2021-11-01 DIAGNOSIS — C7800 Secondary malignant neoplasm of unspecified lung: Secondary | ICD-10-CM | POA: Diagnosis not present

## 2021-11-01 DIAGNOSIS — Z800 Family history of malignant neoplasm of digestive organs: Secondary | ICD-10-CM | POA: Diagnosis not present

## 2021-11-01 DIAGNOSIS — K766 Portal hypertension: Secondary | ICD-10-CM | POA: Diagnosis not present

## 2021-11-01 DIAGNOSIS — Z801 Family history of malignant neoplasm of trachea, bronchus and lung: Secondary | ICD-10-CM | POA: Diagnosis not present

## 2021-11-01 DIAGNOSIS — Z87891 Personal history of nicotine dependence: Secondary | ICD-10-CM | POA: Diagnosis not present

## 2021-11-01 DIAGNOSIS — I129 Hypertensive chronic kidney disease with stage 1 through stage 4 chronic kidney disease, or unspecified chronic kidney disease: Secondary | ICD-10-CM | POA: Diagnosis not present

## 2021-11-01 LAB — CBC WITH DIFFERENTIAL/PLATELET
Abs Immature Granulocytes: 0.02 10*3/uL (ref 0.00–0.07)
Basophils Absolute: 0 10*3/uL (ref 0.0–0.1)
Basophils Relative: 1 %
Eosinophils Absolute: 0.2 10*3/uL (ref 0.0–0.5)
Eosinophils Relative: 5 %
HCT: 28.5 % — ABNORMAL LOW (ref 39.0–52.0)
Hemoglobin: 10 g/dL — ABNORMAL LOW (ref 13.0–17.0)
Immature Granulocytes: 1 %
Lymphocytes Relative: 11 %
Lymphs Abs: 0.3 10*3/uL — ABNORMAL LOW (ref 0.7–4.0)
MCH: 32.7 pg (ref 26.0–34.0)
MCHC: 35.1 g/dL (ref 30.0–36.0)
MCV: 93.1 fL (ref 80.0–100.0)
Monocytes Absolute: 0.4 10*3/uL (ref 0.1–1.0)
Monocytes Relative: 14 %
Neutro Abs: 1.9 10*3/uL (ref 1.7–7.7)
Neutrophils Relative %: 68 %
Platelets: 58 10*3/uL — ABNORMAL LOW (ref 150–400)
RBC: 3.06 MIL/uL — ABNORMAL LOW (ref 4.22–5.81)
RDW: 14.6 % (ref 11.5–15.5)
WBC: 2.8 10*3/uL — ABNORMAL LOW (ref 4.0–10.5)
nRBC: 0 % (ref 0.0–0.2)

## 2021-11-01 LAB — COMPREHENSIVE METABOLIC PANEL
ALT: 26 U/L (ref 0–44)
AST: 37 U/L (ref 15–41)
Albumin: 3.9 g/dL (ref 3.5–5.0)
Alkaline Phosphatase: 121 U/L (ref 38–126)
Anion gap: 9 (ref 5–15)
BUN: 48 mg/dL — ABNORMAL HIGH (ref 6–20)
CO2: 23 mmol/L (ref 22–32)
Calcium: 8.8 mg/dL — ABNORMAL LOW (ref 8.9–10.3)
Chloride: 99 mmol/L (ref 98–111)
Creatinine, Ser: 2.46 mg/dL — ABNORMAL HIGH (ref 0.61–1.24)
GFR, Estimated: 30 mL/min — ABNORMAL LOW (ref >60)
Glucose, Bld: 154 mg/dL — ABNORMAL HIGH (ref 70–99)
Potassium: 4.2 mmol/L (ref 3.5–5.1)
Sodium: 131 mmol/L — ABNORMAL LOW (ref 135–145)
Total Bilirubin: 1.3 mg/dL — ABNORMAL HIGH (ref 0.3–1.2)
Total Protein: 7.6 g/dL (ref 6.5–8.1)

## 2021-11-01 MED ORDER — SODIUM CHLORIDE 0.9 % IV SOLN
Freq: Once | INTRAVENOUS | Status: AC
  Filled 2021-11-01: qty 250

## 2021-11-01 MED ORDER — HEPARIN SOD (PORK) LOCK FLUSH 100 UNIT/ML IV SOLN
INTRAVENOUS | Status: AC
  Administered 2021-11-01: 500 [IU]
  Filled 2021-11-01: qty 5

## 2021-11-01 MED ORDER — HEPARIN SOD (PORK) LOCK FLUSH 100 UNIT/ML IV SOLN
500.0000 [IU] | Freq: Once | INTRAVENOUS | Status: AC | PRN
  Filled 2021-11-01: qty 5

## 2021-11-01 MED ORDER — SODIUM CHLORIDE 0.9 % IV SOLN
240.0000 mg | Freq: Once | INTRAVENOUS | Status: AC
  Administered 2021-11-01: 240 mg via INTRAVENOUS
  Filled 2021-11-01: qty 24

## 2021-11-01 NOTE — Patient Instructions (Signed)
[facility]  Discharge Instructions: °Thank you for choosing [facility] to provide your oncology and hematology care.  °If you have a lab appointment with the Cancer Center, please go directly to the Cancer Center and check in at the registration area. ° °Wear comfortable clothing and clothing appropriate for easy access to any Portacath or PICC line.  ° °We strive to give you quality time with your provider. You may need to reschedule your appointment if you arrive late (15 or more minutes).  Arriving late affects you and other patients whose appointments are after yours.  Also, if you miss three or more appointments without notifying the office, you may be dismissed from the clinic at the provider’s discretion.    °  °For prescription refill requests, have your pharmacy contact our office and allow 72 hours for refills to be completed.   ° °Today you received the following chemotherapy and/or immunotherapy agents Nivolumab     °  °To help prevent nausea and vomiting after your treatment, we encourage you to take your nausea medication as directed. ° °BELOW ARE SYMPTOMS THAT SHOULD BE REPORTED IMMEDIATELY: °*FEVER GREATER THAN 100.4 F (38 °C) OR HIGHER °*CHILLS OR SWEATING °*NAUSEA AND VOMITING THAT IS NOT CONTROLLED WITH YOUR NAUSEA MEDICATION °*UNUSUAL SHORTNESS OF BREATH °*UNUSUAL BRUISING OR BLEEDING °*URINARY PROBLEMS (pain or burning when urinating, or frequent urination) °*BOWEL PROBLEMS (unusual diarrhea, constipation, pain near the anus) °TENDERNESS IN MOUTH AND THROAT WITH OR WITHOUT PRESENCE OF ULCERS (sore throat, sores in mouth, or a toothache) °UNUSUAL RASH, SWELLING OR PAIN  °UNUSUAL VAGINAL DISCHARGE OR ITCHING  ° °Items with * indicate a potential emergency and should be followed up as soon as possible or go to the Emergency Department if any problems should occur. ° °Please show the CHEMOTHERAPY ALERT CARD or IMMUNOTHERAPY ALERT CARD at check-in to  the Emergency Department and triage nurse. ° °Should you have questions after your visit or need to cancel or reschedule your appointment, please contact [facility]  [phone] and follow the prompts.  Office hours are 8:00 a.m. to 4:30 p.m. Monday - Friday. Please note that voicemails left after 4:00 p.m. may not be returned until the following business day.  We are closed weekends and major holidays. You have access to a nurse at all times for urgent questions. Please call the main number to the clinic [phone] and follow the prompts. ° °For any non-urgent questions, you may also contact your provider using MyChart. We now offer e-Visits for anyone 18 and older to request care online for non-urgent symptoms. For details visit [email or web address]. °  °Also download the MyChart app! Go to the app store, search "MyChart", open the app, select [facility], and log in with your MyChart username and password. ° °Due to Covid, a mask is required upon entering the hospital/clinic. If you do not have a mask, one will be given to you upon arrival. For doctor visits, patients may have 1 support person aged 18 or older with them. For treatment visits, patients cannot have anyone with them due to current Covid guidelines and our immunocompromised population.  °

## 2021-11-01 NOTE — Progress Notes (Signed)
?Hematology/Oncology follow-up ?[phone]  ? ? ?Patient Care Team: ?Albina Billet, MD as PCP - General (Internal Medicine) ?Earlie Server, MD as Consulting Physician (Hematology and Oncology) ? ?REFERRING PROVIDER: ?Albina Billet, MD  ?CHIEF COMPLAINTS/REASON FOR VISIT:  ?Follow-up for kidney cancer treatments ? ?HISTORY OF PRESENTING ILLNESS:  ? ?Henry Moore is a  61 y.o.  male with PMH listed below was seen in consultation at the request of  Albina Billet, MD  for evaluation of kidney cancer ?Extensive medical records were reviewed ?Patient had MRI lumbar spine without contrast done on 07/13/2018 which showed mired lumbar degenerative disease. ?CT thoracic spine was done on 08/05/2018 which showed 5 cm left renal mass. ?09/04/2018 CT abdomen and pelvis with and without contrast showed 5 cm round enhancing solid mass in the left kidney.  No involvement of left renal vein.  No lymphadenopathy ?Morphologic changes consistent with cirrhosis and the portal hypertension.  Small volume ascites and splenomegaly. ?. ?10/16/2018 ?Left nephrectomy showed renal cell carcinoma, clear-cell type, nuclear grade 4, tumor extends into the renal vein and renal sinus fat.  Urethral, vascular and or resection margins are negative for tumor pT3a Nx Mx ? ?01/28/2019 patient had another CT chest abdomen pelvis done with contrast ?Showed interval left nephrectomy.  Scattered tiny pulmonary nodules bilaterally.  Nonspecific.  No other evidence of metastatic disease.  Cirrhosis changes extensive coronary and aortic atherosclerosis ? ?07/14/2019 patient underwent surveillance CT chest abdomen pelvis without contrast ?Interval progression of pulmonary metastasis with new enlarged right paratracheal lymph node 1.7 cm, previously 0.6 cm one-point since worrisome for metastatic adenopathy.  Multiple progressive pulmonary nodules, index nodule within the lingula measures 1.3 cm, previously 3 mm. ?Index subpleural nodule within the anterior  right upper lobe measuring 1.8 cm, previously 3 cm ?Index nodule within the post lateral right lower lobe measuring 5 mm, new from previous care ?Aortic sclerosis.  Three-vessel coronary artery calcification noted.  Cirrhosis changes.  Splenomegaly. ? ?Patient was referred to establish care with medical oncology for further discussion and evaluation of metastatic renal cell carcinoma. ?Patient reports feeling well at baseline today.  Denies any shortness of breath, cough, hemoptysis, back pain, headache. ?He quitted smoking 2015.  Not currently actively drinking alcohol as well. ?Patient has chronic back pain unchanged. ? ?#08/14/2019-10/16/2019 nivolumab and ipilimumab. ?#11/06/2019-nivolumab every 2 weeks maintenance. ? ?# Liver cirrhosis with small amount of ascites/splenomegaly patient sees gastroenterology Dr. [name].  . He will get ultrasound surveillance due in February 2021 for screening of [place].  EGD surveillance for Barrett's plan in April 2021. ? ?# Diabetes, on Humalog sliding scale and metformin.  ?#Family history of cancer and personal history of RCC.  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  ? 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  Status post ipilimumab and nivolumab treatment. ? ?# 10/29/2019 CT chest abdomen pelvis without contrast images were independently reviewed by me and discussed with patient.   CT showed marked improvement with resolution of many of the pulmonary nodules, prominent reduced size of other nodules.  Considerably reduced size of the right lower paratracheal lymph node now 0.9 cm in diameter. ?Hepatic cirrhosis with prominent splenomegaly and trace perisplenic ascites. ?Chronic CT findings includes aortic atherosclerotic vascular disease.  Mild sigmoid colon diverticulosis ? ?# CKD,  he establish care with Dr. Candiss Norse  for chronic kidney disease.  Blood pressure medication has been adjusted.  Hydrochlorothiazide decreased to 12.5 mg daily. ? ?# NGS -foundation one PD-L1 TPS 0% no reportable mutation, MS stable. 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  ? ?# 07/16/2020, CT chest abdomen pelvis images were reviewed and discussed with patient ?Compared to July 2021 image, he has had a new patchy groundglass opacities throughout both lungs with central part of solids/airspace components.  Findings are nonspecific and could be secondary to an atypical infection, including viral pneumonia or inflammation.  Patient is asymptomatic.  I wonder the changes are secondary to his Covid infection.  Patient has been started on Levaquin to cover atypical infection by on-call physician and I recommend him to continue and complete the course. ?Proceed with today's immunotherapy nivolumab.  Advised patient to notify me if his symptoms get worse. ? ?# 10/12/2020 CT chest abdomen pelvis with out contrast. ?New 15 mm lytic lesion in the right T9 vertebral body.  Suspicious for isolated lytic metastasis.  No findings of suspicious soft tissue metastasis in the chest abdomen or pelvis.  Residual postinfectious inflammatory scarring in the lung bilaterally.  Cirrhosis.  Splenomegaly.  Minimal ascites. ? ?# 10/26/20 Bone scan showed Minimal increased activity noted at the right costovertebral junction at the T9 level is most likely degenerative. Similar finding noted on prior bone scan of 08/11/2019. No definite T9 vertebral body abnormal activity corresponding to lytic lesion noted on recent CT. Again the recently identified T9 vertebral body lytic lesion is suspicious and further evaluation of the thoracic spine with MRI can be obtained. No other bony abnormalities noted by bone scan. ?I discussed patient's case on tumor board 11/04/2020.  Bone scan probably is not sensitive in detecting lytic  lesion from [place]  Consensus reached upon clinically based on the CT scan, new bone lesion is highly likely metastasis from [place]  Options including proceed with bone lesion biopsy versus referral to radiation oncology for empiric palliative radiation.  Patient has other comorbidities including cirrhosis/pancytopenia, patient opts to proceed with empiric palliative radiation. ? ?12/13/2020 finished RT to T9 ?June 2022 abscess of anal area , s/ drainage and antibiotics. ?July 2022, immunotherapy was held due to diarrhea. ? ?02/01/2021  mild wall thickening of aseconding colon. Small infraumbilical ventral hernia,contains short segment of small bowel. Cirrohsis and splenomegaly. Small volume ascites, slight increase ?02/03/21 chest wo contrast showed  Lytic lesion within T9 slightly increased in size., no new skeletal metastasis. Mild peripheral ground glass densities.  ? ?03/18/2021, resumed on nivolumab treatment x1. ?04/01/2021, hold nivolumab due to diarrhea and upcoming surgery. ?# 04/08/2021 paracentesis. Cytology negative for malignancy.  ?# [facility]14[facility]2021[facility]. ?S/p paracentesis on 04/15/2021 , cytology negative.  ?# 04/22/21 right T9 transpedicular mass resection. [facility]?# 05/06/2021 abdominal distention. S/p repeat therapeutic paracentesis.  ? ?05/30/2021, CT chest abdomen pelvis showed 8 mm left upper lobe pulmonary nodule, increased size compared to 2 mm previously.  Interval progression highly concerning for metastatic disease.  Primary bronchogenic neoplasm is also another consideration. ?Interval thoracic fusion above and below the T10.  Previously described tiny lucency inferior L3 vertebral body is stable.  Cirrhosis with splenomegaly and small volume ascites.  Cholelithiasis.  Aortic atherosclerosis ? ?#08/12/2021, status post stereotactic radiation to lung ? ?09/14/2021, CT chest abdomen pelvis without contrast showed 0.8  cm subpleural nodule of the left lower lobe, previously 0.5 cm.  Other small nodules are unchanged.  Unchanged lytic lesion of T9 with bridging fusion of the lower thoracic spine.  Unchanged tiny lucent

## 2021-11-01 NOTE — Progress Notes (Signed)
Pt in for follow up, denies any concerns today. 

## 2021-11-02 ENCOUNTER — Encounter: Payer: Self-pay | Admitting: Oncology

## 2021-11-15 ENCOUNTER — Inpatient Hospital Stay: Payer: BC Managed Care – PPO

## 2021-11-15 ENCOUNTER — Encounter: Payer: Self-pay | Admitting: Oncology

## 2021-11-15 ENCOUNTER — Inpatient Hospital Stay (HOSPITAL_BASED_OUTPATIENT_CLINIC_OR_DEPARTMENT_OTHER): Payer: BC Managed Care – PPO | Admitting: Oncology

## 2021-11-15 VITALS — BP 152/84 | HR 93 | Temp 96.0°F | Wt 218.0 lb

## 2021-11-15 DIAGNOSIS — K7469 Other cirrhosis of liver: Secondary | ICD-10-CM

## 2021-11-15 DIAGNOSIS — N1832 Chronic kidney disease, stage 3b: Secondary | ICD-10-CM | POA: Diagnosis not present

## 2021-11-15 DIAGNOSIS — D696 Thrombocytopenia, unspecified: Secondary | ICD-10-CM

## 2021-11-15 DIAGNOSIS — C642 Malignant neoplasm of left kidney, except renal pelvis: Secondary | ICD-10-CM

## 2021-11-15 DIAGNOSIS — Z5112 Encounter for antineoplastic immunotherapy: Secondary | ICD-10-CM | POA: Diagnosis not present

## 2021-11-15 DIAGNOSIS — C649 Malignant neoplasm of unspecified kidney, except renal pelvis: Secondary | ICD-10-CM

## 2021-11-15 LAB — COMPREHENSIVE METABOLIC PANEL
ALT: 23 U/L (ref 0–44)
AST: 34 U/L (ref 15–41)
Albumin: 3.8 g/dL (ref 3.5–5.0)
Alkaline Phosphatase: 105 U/L (ref 38–126)
Anion gap: 9 (ref 5–15)
BUN: 38 mg/dL — ABNORMAL HIGH (ref 6–20)
CO2: 24 mmol/L (ref 22–32)
Calcium: 9.8 mg/dL (ref 8.9–10.3)
Chloride: 100 mmol/L (ref 98–111)
Creatinine, Ser: 2.37 mg/dL — ABNORMAL HIGH (ref 0.61–1.24)
GFR, Estimated: 31 mL/min — ABNORMAL LOW (ref >60)
Glucose, Bld: 126 mg/dL — ABNORMAL HIGH (ref 70–99)
Potassium: 3.7 mmol/L (ref 3.5–5.1)
Sodium: 133 mmol/L — ABNORMAL LOW (ref 135–145)
Total Bilirubin: 1.2 mg/dL (ref 0.3–1.2)
Total Protein: 7.8 g/dL (ref 6.5–8.1)

## 2021-11-15 LAB — CBC WITH DIFFERENTIAL/PLATELET
Abs Immature Granulocytes: 0.02 10*3/uL (ref 0.00–0.07)
Basophils Absolute: 0 10*3/uL (ref 0.0–0.1)
Basophils Relative: 1 %
Eosinophils Absolute: 0.2 10*3/uL (ref 0.0–0.5)
Eosinophils Relative: 5 %
HCT: 30.2 % — ABNORMAL LOW (ref 39.0–52.0)
Hemoglobin: 10.7 g/dL — ABNORMAL LOW (ref 13.0–17.0)
Immature Granulocytes: 1 %
Lymphocytes Relative: 13 %
Lymphs Abs: 0.4 10*3/uL — ABNORMAL LOW (ref 0.7–4.0)
MCH: 33.3 pg (ref 26.0–34.0)
MCHC: 35.4 g/dL (ref 30.0–36.0)
MCV: 94.1 fL (ref 80.0–100.0)
Monocytes Absolute: 0.4 10*3/uL (ref 0.1–1.0)
Monocytes Relative: 13 %
Neutro Abs: 2.2 10*3/uL (ref 1.7–7.7)
Neutrophils Relative %: 67 %
Platelets: 68 10*3/uL — ABNORMAL LOW (ref 150–400)
RBC: 3.21 MIL/uL — ABNORMAL LOW (ref 4.22–5.81)
RDW: 14.6 % (ref 11.5–15.5)
WBC: 3.2 10*3/uL — ABNORMAL LOW (ref 4.0–10.5)
nRBC: 0 % (ref 0.0–0.2)

## 2021-11-15 MED ORDER — DENOSUMAB 120 MG/1.7ML ~~LOC~~ SOLN
120.0000 mg | Freq: Once | SUBCUTANEOUS | Status: AC
  Administered 2021-11-15: 120 mg via SUBCUTANEOUS
  Filled 2021-11-15: qty 1.7

## 2021-11-15 MED ORDER — HEPARIN SOD (PORK) LOCK FLUSH 100 UNIT/ML IV SOLN
500.0000 [IU] | Freq: Once | INTRAVENOUS | Status: AC | PRN
  Administered 2021-11-15: 500 [IU]
  Filled 2021-11-15: qty 5

## 2021-11-15 MED ORDER — SODIUM CHLORIDE 0.9 % IV SOLN
Freq: Once | INTRAVENOUS | Status: AC
  Filled 2021-11-15: qty 250

## 2021-11-15 MED ORDER — SODIUM CHLORIDE 0.9 % IV SOLN
240.0000 mg | Freq: Once | INTRAVENOUS | Status: AC
  Administered 2021-11-15: 240 mg via INTRAVENOUS
  Filled 2021-11-15: qty 24

## 2021-11-15 NOTE — Patient Instructions (Signed)
[facility] CANCER CTR AT [place]  Discharge Instructions: ?Thank you for choosing [facility] to provide your oncology and hematology care.  ?If you have a lab appointment with the [facility], please go directly to the [facility] and check in at the registration area. ? ?Wear comfortable clothing and clothing appropriate for easy access to any Portacath or PICC line.  ? ?We strive to give you quality time with your provider. You may need to reschedule your appointment if you arrive late (15 or more minutes).  Arriving late affects you and other patients whose appointments are after yours.  Also, if you miss three or more appointments without notifying the office, you may be dismissed from the clinic at the provider?s discretion.    ?  ?For prescription refill requests, have your pharmacy contact our office and allow 72 hours for refills to be completed.   ? ?Today you received the following chemotherapy and/or immunotherapy agents : nivolumab ?  ?To help prevent nausea and vomiting after your treatment, we encourage you to take your nausea medication as directed. ? ?BELOW ARE SYMPTOMS THAT SHOULD BE REPORTED IMMEDIATELY: ?*FEVER GREATER THAN 100.4 F (38 ?C) OR HIGHER ?*CHILLS OR SWEATING ?*NAUSEA AND VOMITING THAT IS NOT CONTROLLED WITH YOUR NAUSEA MEDICATION ?*UNUSUAL SHORTNESS OF BREATH ?*UNUSUAL BRUISING OR BLEEDING ?*URINARY PROBLEMS (pain or burning when urinating, or frequent urination) ?*BOWEL PROBLEMS (unusual diarrhea, constipation, pain near the anus) ?TENDERNESS IN MOUTH AND THROAT WITH OR WITHOUT PRESENCE OF ULCERS (sore throat, sores in mouth, or a toothache) ?UNUSUAL RASH, SWELLING OR PAIN  ?UNUSUAL VAGINAL DISCHARGE OR ITCHING  ? ?Items with * indicate a potential emergency and should be followed up as soon as possible or go to the Emergency Department if any problems should occur. ? ?Please show the CHEMOTHERAPY ALERT CARD or IMMUNOTHERAPY ALERT CARD at check-in to  the Emergency Department and triage nurse. ? ?Should you have questions after your visit or need to cancel or reschedule your appointment, please contact [facility] CANCER [place] AT [place]  [phone] and follow the prompts.  Office hours are 8:00 a.m. to 4:30 p.m. Monday - Friday. Please note that voicemails left after 4:00 p.m. may not be returned until the following business day.  We are closed weekends and major holidays. You have access to a nurse at all times for urgent questions. Please call the main number to the clinic [phone] and follow the prompts. ? ?For any non-urgent questions, you may also contact your provider using MyChart. We now offer e-Visits for anyone 21 and older to request care online for non-urgent symptoms. For details visit [email or web address]. ?  ?Also download the MyChart app! Go to the app store, search "MyChart", open the app, select , and log in with your MyChart username and password. ? ?Due to Covid, a mask is required upon entering the hospital/clinic. If you do not have a mask, one will be given to you upon arrival. For doctor visits, patients may have 1 support person aged 75 or older with them. For treatment visits, patients cannot have anyone with them due to current Covid guidelines and our immunocompromised population.  ?

## 2021-11-15 NOTE — Progress Notes (Signed)
Patient tolerated nivolumab infusion well today, no concerns voiced. Patient also received xgeva Patient stable at discharge. AVS given.   ?

## 2021-11-15 NOTE — Progress Notes (Signed)
?Hematology/Oncology follow-up ?[phone]  ? ? ?Patient Care Team: ?Tate, Denny C, MD as PCP - General (Internal Medicine) ?Chasitee Zenker, MD as Consulting Physician (Hematology and Oncology) ? ?REFERRING PROVIDER: ?Tate, Denny C, MD  ?CHIEF COMPLAINTS/REASON FOR VISIT:  ?Follow-up for kidney cancer treatments ? ?HISTORY OF PRESENTING ILLNESS:  ? ?Henry Moore is a  61 y.o.  male with PMH listed below was seen in consultation at the request of  Tate, Denny C, MD  for evaluation of kidney cancer ?Extensive medical records were reviewed ?Patient had MRI lumbar spine without contrast done on 07/13/2018 which showed mired lumbar degenerative disease. ?CT thoracic spine was done on 08/05/2018 which showed 5 cm left renal mass. ?09/04/2018 CT abdomen and pelvis with and without contrast showed 5 cm round enhancing solid mass in the left kidney.  No involvement of left renal vein.  No lymphadenopathy ?Morphologic changes consistent with cirrhosis and the portal hypertension.  Small volume ascites and splenomegaly. ?. ?10/16/2018 ?Left nephrectomy showed renal cell carcinoma, clear-cell type, nuclear grade 4, tumor extends into the renal vein and renal sinus fat.  Urethral, vascular and or resection margins are negative for tumor pT3a Nx Mx ? ?01/28/2019 patient had another CT chest abdomen pelvis done with contrast ?Showed interval left nephrectomy.  Scattered tiny pulmonary nodules bilaterally.  Nonspecific.  No other evidence of metastatic disease.  Cirrhosis changes extensive coronary and aortic atherosclerosis ? ?07/14/2019 patient underwent surveillance CT chest abdomen pelvis without contrast ?Interval progression of pulmonary metastasis with new enlarged right paratracheal lymph node 1.7 cm, previously 0.6 cm one-point since worrisome for metastatic adenopathy.  Multiple progressive pulmonary nodules, index nodule within the lingula measures 1.3 cm, previously 3 mm. ?Index subpleural nodule within the anterior  right upper lobe measuring 1.8 cm, previously 3 cm ?Index nodule within the post lateral right lower lobe measuring 5 mm, new from previous care ?Aortic sclerosis.  Three-vessel coronary artery calcification noted.  Cirrhosis changes.  Splenomegaly. ? ?Patient was referred to establish care with medical oncology for further discussion and evaluation of metastatic renal cell carcinoma. ?Patient reports feeling well at baseline today.  Denies any shortness of breath, cough, hemoptysis, back pain, headache. ?He quitted smoking 2015.  Not currently actively drinking alcohol as well. ?Patient has chronic back pain unchanged. ? ?#08/14/2019-10/16/2019 nivolumab and ipilimumab. ?#11/06/2019-nivolumab every 2 weeks maintenance. ? ?# Liver cirrhosis with small amount of ascites/splenomegaly patient sees gastroenterology Dr. Anna.  . He will get ultrasound surveillance due in February 2021 for screening of HCC.  EGD surveillance for Barrett's plan in April 2021. ? ?# Diabetes, on Humalog sliding scale and metformin.  ?#Family history of cancer and personal history of RCC.  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  ? 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  Status post ipilimumab and nivolumab treatment. ? ?# 10/29/2019 CT chest abdomen pelvis without contrast images were independently reviewed by me and discussed with patient.   CT showed marked improvement with resolution of many of the pulmonary nodules, prominent reduced size of other nodules.  Considerably reduced size of the right lower paratracheal lymph node now 0.9 cm in diameter. ?Hepatic cirrhosis with prominent splenomegaly and trace perisplenic ascites. ?Chronic CT findings includes aortic atherosclerotic vascular disease.  Mild sigmoid colon diverticulosis ? ?# CKD,  he establish care with Dr. Singh   for chronic kidney disease.  Blood pressure medication has been adjusted.  Hydrochlorothiazide decreased to 12.5 mg daily. ? ?# NGS -foundation one PD-L1 TPS 0% no reportable mutation, MS stable. TMB 8 muts/mb ?VHL D143fs*16, SETD2 BAP ?Genetic testing is negative.  ? ?# 07/16/2020, CT chest abdomen pelvis images were reviewed and discussed with patient ?Compared to July 2021 image, he has had a new patchy groundglass opacities throughout both lungs with central part of solids/airspace components.  Findings are nonspecific and could be secondary to an atypical infection, including viral pneumonia or inflammation.  Patient is asymptomatic.  I wonder the changes are secondary to his Covid infection.  Patient has been started on Levaquin to cover atypical infection by on-call physician and I recommend him to continue and complete the course. ?Proceed with today's immunotherapy nivolumab.  Advised patient to notify me if his symptoms get worse. ? ?# 10/12/2020 CT chest abdomen pelvis with out contrast. ?New 15 mm lytic lesion in the right T9 vertebral body.  Suspicious for isolated lytic metastasis.  No findings of suspicious soft tissue metastasis in the chest abdomen or pelvis.  Residual postinfectious inflammatory scarring in the lung bilaterally.  Cirrhosis.  Splenomegaly.  Minimal ascites. ? ?# 10/26/20 Bone scan showed Minimal increased activity noted at the right costovertebral junction at the T9 level is most likely degenerative. Similar finding noted on prior bone scan of 08/11/2019. No definite T9 vertebral body abnormal activity corresponding to lytic lesion noted on recent CT. Again the recently identified T9 vertebral body lytic lesion is suspicious and further evaluation of the thoracic spine with MRI can be obtained. No other bony abnormalities noted by bone scan. ?I discussed patient's case on tumor board 11/04/2020.  Bone scan probably is not sensitive in detecting lytic  lesion from RCC.  Consensus reached upon clinically based on the CT scan, new bone lesion is highly likely metastasis from RCC.  Options including proceed with bone lesion biopsy versus referral to radiation oncology for empiric palliative radiation.  Patient has other comorbidities including cirrhosis/pancytopenia, patient opts to proceed with empiric palliative radiation. ? ?12/13/2020 finished RT to T9 ?June 2022 abscess of anal area , s/ drainage and antibiotics. ?July 2022, immunotherapy was held due to diarrhea. ? ?02/01/2021  mild wall thickening of aseconding colon. Small infraumbilical ventral hernia,contains short segment of small bowel. Cirrohsis and splenomegaly. Small volume ascites, slight increase ?02/03/21 chest wo contrast showed  Lytic lesion within T9 slightly increased in size., no new skeletal metastasis. Mild peripheral ground glass densities.  ? ?03/18/2021, resumed on nivolumab treatment x1. ?04/01/2021, hold nivolumab due to diarrhea and upcoming surgery. ?# 04/08/2021 paracentesis. Cytology negative for malignancy.  ?# 04/14/2021- 04/16/2021 admitted to [facility] due to abdominal distension. ?S/p paracentesis on 04/15/2021 , cytology negative.  ?# 04/22/21 right T9 transpedicular mass resection. T7-11 posterior spinal fusion by Dr. Y[place] at [facility] ?# 05/06/2021 abdominal distention. S/p repeat therapeutic paracentesis.  ? ?05/30/2021, CT chest abdomen pelvis showed 8 mm left upper lobe pulmonary nodule, increased size compared to 2 mm previously.  Interval progression highly concerning for metastatic disease.  Primary bronchogenic neoplasm is also another consideration. ?Interval thoracic fusion above and below the T10.  Previously described tiny lucency inferior L3 vertebral body is stable.  Cirrhosis with splenomegaly and small volume ascites.  Cholelithiasis.  Aortic atherosclerosis ? ?#08/12/2021, status post stereotactic radiation to lung ? ?09/14/2021, CT chest abdomen pelvis without contrast showed 0.8  cm subpleural nodule of the left lower lobe, previously 0.5 cm.    Other small nodules are unchanged.  Unchanged lytic lesion of T9 with bridging fusion of the lower thoracic spine.  Unchanged tiny lucent

## 2021-11-16 ENCOUNTER — Other Ambulatory Visit: Payer: Self-pay | Admitting: Gastroenterology

## 2021-11-29 ENCOUNTER — Inpatient Hospital Stay: Payer: Medicare Other | Attending: Oncology

## 2021-11-29 ENCOUNTER — Encounter: Payer: Self-pay | Admitting: Oncology

## 2021-11-29 ENCOUNTER — Inpatient Hospital Stay: Payer: Medicare Other

## 2021-11-29 ENCOUNTER — Inpatient Hospital Stay (HOSPITAL_BASED_OUTPATIENT_CLINIC_OR_DEPARTMENT_OTHER): Payer: Medicare Other | Admitting: Oncology

## 2021-11-29 ENCOUNTER — Other Ambulatory Visit: Payer: Self-pay

## 2021-11-29 VITALS — BP 133/79 | HR 89 | Temp 96.9°F | Wt 220.0 lb

## 2021-11-29 DIAGNOSIS — Z801 Family history of malignant neoplasm of trachea, bronchus and lung: Secondary | ICD-10-CM | POA: Diagnosis not present

## 2021-11-29 DIAGNOSIS — C642 Malignant neoplasm of left kidney, except renal pelvis: Secondary | ICD-10-CM | POA: Diagnosis present

## 2021-11-29 DIAGNOSIS — D696 Thrombocytopenia, unspecified: Secondary | ICD-10-CM | POA: Diagnosis not present

## 2021-11-29 DIAGNOSIS — E1122 Type 2 diabetes mellitus with diabetic chronic kidney disease: Secondary | ICD-10-CM | POA: Insufficient documentation

## 2021-11-29 DIAGNOSIS — Z5112 Encounter for antineoplastic immunotherapy: Secondary | ICD-10-CM | POA: Diagnosis not present

## 2021-11-29 DIAGNOSIS — R161 Splenomegaly, not elsewhere classified: Secondary | ICD-10-CM | POA: Insufficient documentation

## 2021-11-29 DIAGNOSIS — Z7984 Long term (current) use of oral hypoglycemic drugs: Secondary | ICD-10-CM | POA: Insufficient documentation

## 2021-11-29 DIAGNOSIS — I129 Hypertensive chronic kidney disease with stage 1 through stage 4 chronic kidney disease, or unspecified chronic kidney disease: Secondary | ICD-10-CM | POA: Insufficient documentation

## 2021-11-29 DIAGNOSIS — R188 Other ascites: Secondary | ICD-10-CM | POA: Insufficient documentation

## 2021-11-29 DIAGNOSIS — I2510 Atherosclerotic heart disease of native coronary artery without angina pectoris: Secondary | ICD-10-CM | POA: Insufficient documentation

## 2021-11-29 DIAGNOSIS — D72810 Lymphocytopenia: Secondary | ICD-10-CM | POA: Insufficient documentation

## 2021-11-29 DIAGNOSIS — C649 Malignant neoplasm of unspecified kidney, except renal pelvis: Secondary | ICD-10-CM

## 2021-11-29 DIAGNOSIS — C7800 Secondary malignant neoplasm of unspecified lung: Secondary | ICD-10-CM | POA: Insufficient documentation

## 2021-11-29 DIAGNOSIS — N1832 Chronic kidney disease, stage 3b: Secondary | ICD-10-CM

## 2021-11-29 DIAGNOSIS — Z79899 Other long term (current) drug therapy: Secondary | ICD-10-CM | POA: Insufficient documentation

## 2021-11-29 DIAGNOSIS — I700 Atherosclerosis of aorta: Secondary | ICD-10-CM | POA: Diagnosis not present

## 2021-11-29 DIAGNOSIS — Z803 Family history of malignant neoplasm of breast: Secondary | ICD-10-CM | POA: Insufficient documentation

## 2021-11-29 DIAGNOSIS — K7469 Other cirrhosis of liver: Secondary | ICD-10-CM

## 2021-11-29 DIAGNOSIS — C7951 Secondary malignant neoplasm of bone: Secondary | ICD-10-CM | POA: Diagnosis not present

## 2021-11-29 LAB — COMPREHENSIVE METABOLIC PANEL
ALT: 17 U/L (ref 0–44)
AST: 29 U/L (ref 15–41)
Albumin: 3.7 g/dL (ref 3.5–5.0)
Alkaline Phosphatase: 106 U/L (ref 38–126)
Anion gap: 8 (ref 5–15)
BUN: 35 mg/dL — ABNORMAL HIGH (ref 6–20)
CO2: 25 mmol/L (ref 22–32)
Calcium: 9.5 mg/dL (ref 8.9–10.3)
Chloride: 100 mmol/L (ref 98–111)
Creatinine, Ser: 2.34 mg/dL — ABNORMAL HIGH (ref 0.61–1.24)
GFR, Estimated: 31 mL/min — ABNORMAL LOW (ref >60)
Glucose, Bld: 174 mg/dL — ABNORMAL HIGH (ref 70–99)
Potassium: 3.6 mmol/L (ref 3.5–5.1)
Sodium: 133 mmol/L — ABNORMAL LOW (ref 135–145)
Total Bilirubin: 1.1 mg/dL (ref 0.3–1.2)
Total Protein: 7.5 g/dL (ref 6.5–8.1)

## 2021-11-29 LAB — CBC WITH DIFFERENTIAL/PLATELET
Abs Immature Granulocytes: 0.01 10*3/uL (ref 0.00–0.07)
Basophils Absolute: 0 10*3/uL (ref 0.0–0.1)
Basophils Relative: 1 %
Eosinophils Absolute: 0.1 10*3/uL (ref 0.0–0.5)
Eosinophils Relative: 4 %
HCT: 29.1 % — ABNORMAL LOW (ref 39.0–52.0)
Hemoglobin: 10.1 g/dL — ABNORMAL LOW (ref 13.0–17.0)
Immature Granulocytes: 0 %
Lymphocytes Relative: 11 %
Lymphs Abs: 0.3 10*3/uL — ABNORMAL LOW (ref 0.7–4.0)
MCH: 32.8 pg (ref 26.0–34.0)
MCHC: 34.7 g/dL (ref 30.0–36.0)
MCV: 94.5 fL (ref 80.0–100.0)
Monocytes Absolute: 0.4 10*3/uL (ref 0.1–1.0)
Monocytes Relative: 14 %
Neutro Abs: 2.1 10*3/uL (ref 1.7–7.7)
Neutrophils Relative %: 70 %
Platelets: 63 10*3/uL — ABNORMAL LOW (ref 150–400)
RBC: 3.08 MIL/uL — ABNORMAL LOW (ref 4.22–5.81)
RDW: 13.8 % (ref 11.5–15.5)
WBC: 3 10*3/uL — ABNORMAL LOW (ref 4.0–10.5)
nRBC: 0 % (ref 0.0–0.2)

## 2021-11-29 LAB — FERRITIN: Ferritin: 117 ng/mL (ref 24–336)

## 2021-11-29 LAB — IRON AND TIBC
Iron: 48 ug/dL (ref 45–182)
Saturation Ratios: 16 % — ABNORMAL LOW (ref 17.9–39.5)
TIBC: 298 ug/dL (ref 250–450)
UIBC: 250 ug/dL

## 2021-11-29 LAB — TSH: TSH: 2.595 u[IU]/mL (ref 0.350–4.500)

## 2021-11-29 LAB — T4, FREE: Free T4: 1.06 ng/dL (ref 0.61–1.12)

## 2021-11-29 MED ORDER — SODIUM CHLORIDE 0.9 % IV SOLN
240.0000 mg | Freq: Once | INTRAVENOUS | Status: AC
  Administered 2021-11-29: 240 mg via INTRAVENOUS
  Filled 2021-11-29: qty 24

## 2021-11-29 MED ORDER — HEPARIN SOD (PORK) LOCK FLUSH 100 UNIT/ML IV SOLN
500.0000 [IU] | Freq: Once | INTRAVENOUS | Status: AC | PRN
  Filled 2021-11-29: qty 5

## 2021-11-29 MED ORDER — HEPARIN SOD (PORK) LOCK FLUSH 100 UNIT/ML IV SOLN
INTRAVENOUS | Status: AC
  Administered 2021-11-29: 500 [IU]
  Filled 2021-11-29: qty 5

## 2021-11-29 MED ORDER — SODIUM CHLORIDE 0.9 % IV SOLN
Freq: Once | INTRAVENOUS | Status: AC
  Filled 2021-11-29: qty 250

## 2021-11-29 NOTE — Patient Instructions (Signed)
[facility] AT [place]COLOG[facility]  Discharge Instructions: °Thank you for choosing [facility] to provide your oncology and hematology care.  °If you have a lab appointment with the Cancer Center, please go directly to the Cancer Center and check in at the registration area. ° °Wear comfortable clothing and clothing appropriate for easy access to any Portacath or PICC line.  ° °We strive to give you quality time with your provider. You may need to reschedule your appointment if you arrive late (15 or more minutes).  Arriving late affects you and other patients whose appointments are after yours.  Also, if you miss three or more appointments without notifying the office, you may be dismissed from the clinic at the provider’s discretion.    °  °For prescription refill requests, have your pharmacy contact our office and allow 72 hours for refills to be completed.   ° °Today you received the following chemotherapy and/or immunotherapy agents Nivolumab     °  °To help prevent nausea and vomiting after your treatment, we encourage you to take your nausea medication as directed. ° °BELOW ARE SYMPTOMS THAT SHOULD BE REPORTED IMMEDIATELY: °*FEVER GREATER THAN 100.4 F (38 °C) OR HIGHER °*CHILLS OR SWEATING °*NAUSEA AND VOMITING THAT IS NOT CONTROLLED WITH YOUR NAUSEA MEDICATION °*UNUSUAL SHORTNESS OF BREATH °*UNUSUAL BRUISING OR BLEEDING °*URINARY PROBLEMS (pain or burning when urinating, or frequent urination) °*BOWEL PROBLEMS (unusual diarrhea, constipation, pain near the anus) °TENDERNESS IN MOUTH AND THROAT WITH OR WITHOUT PRESENCE OF ULCERS (sore throat, sores in mouth, or a toothache) °UNUSUAL RASH, SWELLING OR PAIN  °UNUSUAL VAGINAL DISCHARGE OR ITCHING  ° °Items with * indicate a potential emergency and should be followed up as soon as possible or go to the Emergency Department if any problems should occur. ° °Please show the CHEMOTHERAPY ALERT CARD or IMMUNOTHERAPY ALERT CARD at check-in to  the Emergency Department and triage nurse. ° °Should you have questions after your visit or need to cancel or reschedule your appointment, please contact [facility] [place]  [phone] and follow the prompts.  Office hours are 8:00 a.m. to 4:30 p.m. Monday - Friday. Please note that voicemails left after 4:00 p.m. may not be returned until the following business day.  We are closed weekends and major holidays. You have access to a nurse at all times for urgent questions. Please call the main number to the clinic [phone] and follow the prompts. ° °For any non-urgent questions, you may also contact your provider using MyChart. We now offer e-Visits for anyone 18 and older to request care online for non-urgent symptoms. For details visit [email or web address]. °  °Also download the MyChart app! Go to the app store, search "MyChart", open the app, select [place], and log in with your MyChart username and password. ° °Due to Covid, a mask is required upon entering the hospital/clinic. If you do not have a mask, one will be given to you upon arrival. For doctor visits, patients may have 1 support person aged 18 or older with them. For treatment visits, patients cannot have anyone with them due to current Covid guidelines and our immunocompromised population.  °

## 2021-11-29 NOTE — Progress Notes (Signed)
?Hematology/Oncology Progress note ?[phone] [phone] ?  ? ? ? ?Patient Care Team: ?Albina Billet, MD as PCP - General (Internal Medicine) ?Earlie Server, MD as Consulting Physician (Hematology and Oncology) ? ?REFERRING PROVIDER: ?Albina Billet, MD  ?CHIEF COMPLAINTS/REASON FOR VISIT:  ?Follow-up for kidney cancer treatments ? ?HISTORY OF PRESENTING ILLNESS:  ? ?Henry Moore is a  61 y.o.  male with PMH listed below was seen in consultation at the request of  Albina Billet, MD  for evaluation of kidney cancer ?Extensive medical records were reviewed ?Patient had MRI lumbar spine without contrast done on 07/13/2018 which showed mired lumbar degenerative disease. ?CT thoracic spine was done on 08/05/2018 which showed 5 cm left renal mass. ?09/04/2018 CT abdomen and pelvis with and without contrast showed 5 cm round enhancing solid mass in the left kidney.  No involvement of left renal vein.  No lymphadenopathy ?Morphologic changes consistent with cirrhosis and the portal hypertension.  Small volume ascites and splenomegaly. ?. ?10/16/2018 ?Left nephrectomy showed renal cell carcinoma, clear-cell type, nuclear grade 4, tumor extends into the renal vein and renal sinus fat.  Urethral, vascular and or resection margins are negative for tumor pT3a Nx Mx ? ?01/28/2019 patient had another CT chest abdomen pelvis done with contrast ?Showed interval left nephrectomy.  Scattered tiny pulmonary nodules bilaterally.  Nonspecific.  No other evidence of metastatic disease.  Cirrhosis changes extensive coronary and aortic atherosclerosis ? ?07/14/2019 patient underwent surveillance CT chest abdomen pelvis without contrast ?Interval progression of pulmonary metastasis with new enlarged right paratracheal lymph node 1.7 cm, previously 0.6 cm one-point since worrisome for metastatic adenopathy.  Multiple progressive pulmonary nodules, index nodule within the lingula measures 1.3 cm, previously 3 mm. ?Index subpleural  nodule within the anterior right upper lobe measuring 1.8 cm, previously 3 cm ?Index nodule within the post lateral right lower lobe measuring 5 mm, new from previous care ?Aortic sclerosis.  Three-vessel coronary artery calcification noted.  Cirrhosis changes.  Splenomegaly. ? ?Patient was referred to establish care with medical oncology for further discussion and evaluation of metastatic renal cell carcinoma. ?Patient reports feeling well at baseline today.  Denies any shortness of breath, cough, hemoptysis, back pain, headache. ?He quitted smoking 2015.  Not currently actively drinking alcohol as well. ?Patient has chronic back pain unchanged. ? ?#08/14/2019-10/16/2019 nivolumab and ipilimumab. ?#11/06/2019-nivolumab every 2 weeks maintenance. ? ?# Liver cirrhosis with small amount of ascites/splenomegaly patient sees gastroenterology Dr. [name].  . He will get ultrasound surveillance due in February 2021 for screening of [place].  EGD surveillance for Barrett's plan in April 2021. ? ?# Diabetes, on Humalog sliding scale and metformin.  ?#Family history of cancer and personal history of RCC.  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  ? 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  Status post ipilimumab and nivolumab treatment. ? ?# 10/29/2019 CT chest abdomen pelvis without contrast images were independently reviewed by me and discussed with patient.   CT showed marked improvement with resolution of many of the pulmonary nodules, prominent reduced size of other nodules.  Considerably reduced size of the right lower paratracheal lymph node now 0.9 cm in diameter. ?Hepatic cirrhosis with prominent splenomegaly and trace perisplenic ascites. ?Chronic CT findings includes aortic atherosclerotic vascular disease.  Mild sigmoid  colon diverticulosis ? ?# CKD, he  establish care with Dr. Candiss Norse for chronic kidney disease.  Blood pressure medication has been adjusted.  Hydrochlorothiazide decreased to 12.5 mg daily. ? ?# NGS -foundation one PD-L1 TPS 0% no reportable mutation, MS stable. 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  ? ?# 07/16/2020, CT chest abdomen pelvis images were reviewed and discussed with patient ?Compared to July 2021 image, he has had a new patchy groundglass opacities throughout both lungs with central part of solids/airspace components.  Findings are nonspecific and could be secondary to an atypical infection, including viral pneumonia or inflammation.  Patient is asymptomatic.  I wonder the changes are secondary to his Covid infection.  Patient has been started on Levaquin to cover atypical infection by on-call physician and I recommend him to continue and complete the course. ?Proceed with today's immunotherapy nivolumab.  Advised patient to notify me if his symptoms get worse. ? ?# 10/12/2020 CT chest abdomen pelvis with out contrast. ?New 15 mm lytic lesion in the right T9 vertebral body.  Suspicious for isolated lytic metastasis.  No findings of suspicious soft tissue metastasis in the chest abdomen or pelvis.  Residual postinfectious inflammatory scarring in the lung bilaterally.  Cirrhosis.  Splenomegaly.  Minimal ascites. ? ?# 10/26/20 Bone scan showed Minimal increased activity noted at the right costovertebral junction at the T9 level is most likely degenerative. Similar finding noted on prior bone scan of 08/11/2019. No definite T9 vertebral body abnormal activity corresponding to lytic lesion noted on recent CT. Again the recently identified T9 vertebral body lytic lesion is suspicious and further evaluation of the thoracic spine with MRI can be obtained. No other bony abnormalities noted by bone scan. ?I discussed patient's case on tumor board 11/04/2020.  Bone scan probably is not  sensitive in detecting lytic lesion from [place]  Consensus reached upon clinically based on the CT scan, new bone lesion is highly likely metastasis from [place]  Options including proceed with bone lesion biopsy versus referral to radiation oncology for empiric palliative radiation.  Patient has other comorbidities including cirrhosis/pancytopenia, patient opts to proceed with empiric palliative radiation. ? ?12/13/2020 finished RT to T9 ?June 2022 abscess of anal area , s/ drainage and antibiotics. ?July 2022, immunotherapy was held due to diarrhea. ? ?02/01/2021  mild wall thickening of aseconding colon. Small infraumbilical ventral hernia,contains short segment of small bowel. Cirrohsis and splenomegaly. Small volume ascites, slight increase ?02/03/21 chest wo contrast showed  Lytic lesion within T9 slightly increased in size., no new skeletal metastasis. Mild peripheral ground glass densities.  ? ?03/18/2021, resumed on nivolumab treatment x1. ?04/01/2021, hold nivolumab due to diarrhea and upcoming surgery. ?# 04/08/2021 paracentesis. Cytology negative for malignancy.  ?# [facility]14[facility]2021[facility] ?S/p paracentesis on 04/15/2021 , cytology negative.  ?# 04/22/21 right T9 transpedicular mass resection. T7-11 [facility]?# 05/06/2021 abdominal distention. S/p repeat therapeutic paracentesis.  ? ?05/30/2021, CT chest abdomen pelvis showed 8 mm left upper lobe pulmonary nodule, increased size compared to 2 mm previously.  Interval progression highly concerning for metastatic disease.  Primary bronchogenic neoplasm is also another consideration. ?Interval thoracic fusion above and below the T10.  Previously described tiny lucency inferior L3 vertebral body is stable.  Cirrhosis with splenomegaly and small volume ascites.  Cholelithiasis.  Aortic atherosclerosis ? ?#08/12/2021, status post stereotactic radiation to lung ? ?09/14/2021, CT chest abdomen pelvis  without contrast showed 0.8 cm subpleural nodule of the left  lower lobe, previously 0.5 cm.  Other small nodules are unchanged.  Unchanged lytic lesion of T9 with bridging fusion of the lower thoracic sp

## 2021-12-06 ENCOUNTER — Ambulatory Visit
Admission: RE | Admit: 2021-12-06 | Discharge: 2021-12-06 | Disposition: A | Discharge status: Home / Self Care | Payer: BC Managed Care – PPO | Source: Ambulatory Visit | Attending: Oncology | Admitting: Oncology

## 2021-12-06 ENCOUNTER — Encounter
Admission: RE | Admit: 2021-12-06 | Discharge: 2021-12-06 | Disposition: A | Discharge status: Home / Self Care | Payer: BC Managed Care – PPO | Source: Ambulatory Visit | Attending: Oncology | Admitting: Oncology

## 2021-12-06 DIAGNOSIS — C642 Malignant neoplasm of left kidney, except renal pelvis: Secondary | ICD-10-CM | POA: Insufficient documentation

## 2021-12-06 MED ORDER — TECHNETIUM TC 99M MEDRONATE IV KIT
20.0000 | PACK | Freq: Once | INTRAVENOUS | Status: AC | PRN
  Administered 2021-12-06: 21.17 via INTRAVENOUS

## 2021-12-13 ENCOUNTER — Encounter: Payer: Self-pay | Admitting: Oncology

## 2021-12-13 ENCOUNTER — Inpatient Hospital Stay (HOSPITAL_BASED_OUTPATIENT_CLINIC_OR_DEPARTMENT_OTHER): Payer: Medicare Other | Admitting: Oncology

## 2021-12-13 ENCOUNTER — Inpatient Hospital Stay: Payer: Medicare Other

## 2021-12-13 VITALS — BP 138/68 | HR 89 | Temp 97.6°F | Resp 20 | Wt 219.9 lb

## 2021-12-13 DIAGNOSIS — D696 Thrombocytopenia, unspecified: Secondary | ICD-10-CM | POA: Diagnosis not present

## 2021-12-13 DIAGNOSIS — C642 Malignant neoplasm of left kidney, except renal pelvis: Secondary | ICD-10-CM

## 2021-12-13 DIAGNOSIS — Z5112 Encounter for antineoplastic immunotherapy: Secondary | ICD-10-CM | POA: Diagnosis not present

## 2021-12-13 DIAGNOSIS — C649 Malignant neoplasm of unspecified kidney, except renal pelvis: Secondary | ICD-10-CM

## 2021-12-13 DIAGNOSIS — K7469 Other cirrhosis of liver: Secondary | ICD-10-CM

## 2021-12-13 DIAGNOSIS — N1832 Chronic kidney disease, stage 3b: Secondary | ICD-10-CM

## 2021-12-13 LAB — CBC WITH DIFFERENTIAL/PLATELET
Abs Immature Granulocytes: 0.02 10*3/uL (ref 0.00–0.07)
Basophils Absolute: 0 10*3/uL (ref 0.0–0.1)
Basophils Relative: 1 %
Eosinophils Absolute: 0.2 10*3/uL (ref 0.0–0.5)
Eosinophils Relative: 5 %
HCT: 29.9 % — ABNORMAL LOW (ref 39.0–52.0)
Hemoglobin: 10.5 g/dL — ABNORMAL LOW (ref 13.0–17.0)
Immature Granulocytes: 1 %
Lymphocytes Relative: 11 %
Lymphs Abs: 0.3 10*3/uL — ABNORMAL LOW (ref 0.7–4.0)
MCH: 32.9 pg (ref 26.0–34.0)
MCHC: 35.1 g/dL (ref 30.0–36.0)
MCV: 93.7 fL (ref 80.0–100.0)
Monocytes Absolute: 0.4 10*3/uL (ref 0.1–1.0)
Monocytes Relative: 12 %
Neutro Abs: 2.1 10*3/uL (ref 1.7–7.7)
Neutrophils Relative %: 70 %
Platelets: 68 10*3/uL — ABNORMAL LOW (ref 150–400)
RBC: 3.19 MIL/uL — ABNORMAL LOW (ref 4.22–5.81)
RDW: 13.5 % (ref 11.5–15.5)
WBC: 3 10*3/uL — ABNORMAL LOW (ref 4.0–10.5)
nRBC: 0 % (ref 0.0–0.2)

## 2021-12-13 LAB — COMPREHENSIVE METABOLIC PANEL
ALT: 19 U/L (ref 0–44)
AST: 29 U/L (ref 15–41)
Albumin: 3.8 g/dL (ref 3.5–5.0)
Alkaline Phosphatase: 110 U/L (ref 38–126)
Anion gap: 9 (ref 5–15)
BUN: 34 mg/dL — ABNORMAL HIGH (ref 6–20)
CO2: 22 mmol/L (ref 22–32)
Calcium: 9.5 mg/dL (ref 8.9–10.3)
Chloride: 103 mmol/L (ref 98–111)
Creatinine, Ser: 2.19 mg/dL — ABNORMAL HIGH (ref 0.61–1.24)
GFR, Estimated: 34 mL/min — ABNORMAL LOW (ref >60)
Glucose, Bld: 126 mg/dL — ABNORMAL HIGH (ref 70–99)
Potassium: 3.6 mmol/L (ref 3.5–5.1)
Sodium: 134 mmol/L — ABNORMAL LOW (ref 135–145)
Total Bilirubin: 0.9 mg/dL (ref 0.3–1.2)
Total Protein: 7.8 g/dL (ref 6.5–8.1)

## 2021-12-13 MED ORDER — DENOSUMAB 120 MG/1.7ML ~~LOC~~ SOLN
120.0000 mg | Freq: Once | SUBCUTANEOUS | Status: AC
  Administered 2021-12-13: 120 mg via SUBCUTANEOUS
  Filled 2021-12-13: qty 1.7

## 2021-12-13 MED ORDER — SODIUM CHLORIDE 0.9% FLUSH
10.0000 mL | INTRAVENOUS | Status: DC | PRN
  Filled 2021-12-13: qty 10

## 2021-12-13 MED ORDER — SODIUM CHLORIDE 0.9 % IV SOLN
Freq: Once | INTRAVENOUS | Status: AC
  Filled 2021-12-13: qty 250

## 2021-12-13 MED ORDER — HEPARIN SOD (PORK) LOCK FLUSH 100 UNIT/ML IV SOLN
500.0000 [IU] | Freq: Once | INTRAVENOUS | Status: AC | PRN
  Administered 2021-12-13: 500 [IU]
  Filled 2021-12-13: qty 5

## 2021-12-13 MED ORDER — SODIUM CHLORIDE 0.9 % IV SOLN
240.0000 mg | Freq: Once | INTRAVENOUS | Status: AC
  Administered 2021-12-13: 240 mg via INTRAVENOUS
  Filled 2021-12-13: qty 24

## 2021-12-13 NOTE — Progress Notes (Signed)
?Hematology/Oncology Progress note ?[phone] [phone] ?  ? ? ? ?Patient Care Team: ?Albina Billet, MD as PCP - General (Internal Medicine) ?Earlie Server, MD as Consulting Physician (Hematology and Oncology) ? ?REFERRING PROVIDER: ?Albina Billet, MD  ?CHIEF COMPLAINTS/REASON FOR VISIT:  ?Follow-up for kidney cancer treatments ? ?HISTORY OF PRESENTING ILLNESS:  ? ?Henry Moore is a  61 y.o.  male with PMH listed below was seen in consultation at the request of  Albina Billet, MD  for evaluation of kidney cancer ?Extensive medical records were reviewed ?Patient had MRI lumbar spine without contrast done on 07/13/2018 which showed mired lumbar degenerative disease. ?CT thoracic spine was done on 08/05/2018 which showed 5 cm left renal mass. ?09/04/2018 CT abdomen and pelvis with and without contrast showed 5 cm round enhancing solid mass in the left kidney.  No involvement of left renal vein.  No lymphadenopathy ?Morphologic changes consistent with cirrhosis and the portal hypertension.  Small volume ascites and splenomegaly. ?. ?10/16/2018 ?Left nephrectomy showed renal cell carcinoma, clear-cell type, nuclear grade 4, tumor extends into the renal vein and renal sinus fat.  Urethral, vascular and or resection margins are negative for tumor pT3a Nx Mx ? ?01/28/2019 patient had another CT chest abdomen pelvis done with contrast ?Showed interval left nephrectomy.  Scattered tiny pulmonary nodules bilaterally.  Nonspecific.  No other evidence of metastatic disease.  Cirrhosis changes extensive coronary and aortic atherosclerosis ? ?07/14/2019 patient underwent surveillance CT chest abdomen pelvis without contrast ?Interval progression of pulmonary metastasis with new enlarged right paratracheal lymph node 1.7 cm, previously 0.6 cm one-point since worrisome for metastatic adenopathy.  Multiple progressive pulmonary nodules, index nodule within the lingula measures 1.3 cm, previously 3 mm. ?Index subpleural  nodule within the anterior right upper lobe measuring 1.8 cm, previously 3 cm ?Index nodule within the post lateral right lower lobe measuring 5 mm, new from previous care ?Aortic sclerosis.  Three-vessel coronary artery calcification noted.  Cirrhosis changes.  Splenomegaly. ? ?Patient was referred to establish care with medical oncology for further discussion and evaluation of metastatic renal cell carcinoma. ?Patient reports feeling well at baseline today.  Denies any shortness of breath, cough, hemoptysis, back pain, headache. ?He quitted smoking 2015.  Not currently actively drinking alcohol as well. ?Patient has chronic back pain unchanged. ? ?#08/14/2019-10/16/2019 nivolumab and ipilimumab. ?#11/06/2019-nivolumab every 2 weeks maintenance. ? ?# Liver cirrhosis with small amount of ascites/splenomegaly patient sees gastroenterology Dr. [name].  . He will get ultrasound surveillance due in February 2021 for screening of [place].  EGD surveillance for Barrett's plan in April 2021. ? ?# Diabetes, on Humalog sliding scale and metformin.  ?#Family history of cancer and personal history of RCC.  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  ? 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  Status post ipilimumab and nivolumab treatment. ? ?# 10/29/2019 CT chest abdomen pelvis without contrast images were independently reviewed by me and discussed with patient.   CT showed marked improvement with resolution of many of the pulmonary nodules, prominent reduced size of other nodules.  Considerably reduced size of the right lower paratracheal lymph node now 0.9 cm in diameter. ?Hepatic cirrhosis with prominent splenomegaly and trace perisplenic ascites. ?Chronic CT findings includes aortic atherosclerotic vascular disease.  Mild sigmoid  colon diverticulosis ? ?# CKD, he  establish care with Dr. Candiss Norse for chronic kidney disease.  Blood pressure medication has been adjusted.  Hydrochlorothiazide decreased to 12.5 mg daily. ? ?# NGS -foundation one PD-L1 TPS 0% no reportable mutation, MS stable. 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  ? ?# 07/16/2020, CT chest abdomen pelvis images were reviewed and discussed with patient ?Compared to July 2021 image, he has had a new patchy groundglass opacities throughout both lungs with central part of solids/airspace components.  Findings are nonspecific and could be secondary to an atypical infection, including viral pneumonia or inflammation.  Patient is asymptomatic.  I wonder the changes are secondary to his Covid infection.  Patient has been started on Levaquin to cover atypical infection by on-call physician and I recommend him to continue and complete the course. ?Proceed with today's immunotherapy nivolumab.  Advised patient to notify me if his symptoms get worse. ? ?# 10/12/2020 CT chest abdomen pelvis with out contrast. ?New 15 mm lytic lesion in the right T9 vertebral body.  Suspicious for isolated lytic metastasis.  No findings of suspicious soft tissue metastasis in the chest abdomen or pelvis.  Residual postinfectious inflammatory scarring in the lung bilaterally.  Cirrhosis.  Splenomegaly.  Minimal ascites. ? ?# 10/26/20 Bone scan showed Minimal increased activity noted at the right costovertebral junction at the T9 level is most likely degenerative. Similar finding noted on prior bone scan of 08/11/2019. No definite T9 vertebral body abnormal activity corresponding to lytic lesion noted on recent CT. Again the recently identified T9 vertebral body lytic lesion is suspicious and further evaluation of the thoracic spine with MRI can be obtained. No other bony abnormalities noted by bone scan. ?I discussed patient's case on tumor board 11/04/2020.  Bone scan probably is not  sensitive in detecting lytic lesion from [place]  Consensus reached upon clinically based on the CT scan, new bone lesion is highly likely metastasis from [place]  Options including proceed with bone lesion biopsy versus referral to radiation oncology for empiric palliative radiation.  Patient has other comorbidities including cirrhosis/pancytopenia, patient opts to proceed with empiric palliative radiation. ? ?12/13/2020 finished RT to T9 ?June 2022 abscess of anal area , s/ drainage and antibiotics. ?July 2022, immunotherapy was held due to diarrhea. ? ?02/01/2021  mild wall thickening of aseconding colon. Small infraumbilical ventral hernia,contains short segment of small bowel. Cirrohsis and splenomegaly. Small volume ascites, slight increase ?02/03/21 chest wo contrast showed  Lytic lesion within T9 slightly increased in size., no new skeletal metastasis. Mild peripheral ground glass densities.  ? ?03/18/2021, resumed on nivolumab treatment x1. ?04/01/2021, hold nivolumab due to diarrhea and upcoming surgery. ?# 04/08/2021 paracentesis. Cytology negative for malignancy.  ?# [facility]14[facility]2021[facility] ?S/p paracentesis on 04/15/2021 , cytology negative.  ?# 04/22/21 right T9 transpedicular mass resection. T7-11 [facility]?# 05/06/2021 abdominal distention. S/p repeat therapeutic paracentesis.  ? ?05/30/2021, CT chest abdomen pelvis showed 8 mm left upper lobe pulmonary nodule, increased size compared to 2 mm previously.  Interval progression highly concerning for metastatic disease.  Primary bronchogenic neoplasm is also another consideration. ?Interval thoracic fusion above and below the T10.  Previously described tiny lucency inferior L3 vertebral body is stable.  Cirrhosis with splenomegaly and small volume ascites.  Cholelithiasis.  Aortic atherosclerosis ? ?#08/12/2021, status post stereotactic radiation to lung ? ?09/14/2021, CT chest abdomen pelvis  without contrast showed 0.8 cm subpleural nodule of the left  lower lobe, previously 0.5 cm.  Other small nodules are unchanged.  Unchanged lytic lesion of T9 with bridging fusion of the lower thoracic sp

## 2021-12-13 NOTE — Patient Instructions (Signed)
[facility]  Discharge Instructions: °Thank you for choosing [facility] to provide your oncology and hematology care.  °If you have a lab appointment with the Cancer Center, please go directly to the Cancer Center and check in at the registration area. ° °Wear comfortable clothing and clothing appropriate for easy access to any Portacath or PICC line.  ° °We strive to give you quality time with your provider. You may need to reschedule your appointment if you arrive late (15 or more minutes).  Arriving late affects you and other patients whose appointments are after yours.  Also, if you miss three or more appointments without notifying the office, you may be dismissed from the clinic at the provider’s discretion.    °  °For prescription refill requests, have your pharmacy contact our office and allow 72 hours for refills to be completed.   ° °Today you received the following chemotherapy and/or immunotherapy agents Nivolumab     °  °To help prevent nausea and vomiting after your treatment, we encourage you to take your nausea medication as directed. ° °BELOW ARE SYMPTOMS THAT SHOULD BE REPORTED IMMEDIATELY: °*FEVER GREATER THAN 100.4 F (38 °C) OR HIGHER °*CHILLS OR SWEATING °*NAUSEA AND VOMITING THAT IS NOT CONTROLLED WITH YOUR NAUSEA MEDICATION °*UNUSUAL SHORTNESS OF BREATH °*UNUSUAL BRUISING OR BLEEDING °*URINARY PROBLEMS (pain or burning when urinating, or frequent urination) °*BOWEL PROBLEMS (unusual diarrhea, constipation, pain near the anus) °TENDERNESS IN MOUTH AND THROAT WITH OR WITHOUT PRESENCE OF ULCERS (sore throat, sores in mouth, or a toothache) °UNUSUAL RASH, SWELLING OR PAIN  °UNUSUAL VAGINAL DISCHARGE OR ITCHING  ° °Items with * indicate a potential emergency and should be followed up as soon as possible or go to the Emergency Department if any problems should occur. ° °Please show the CHEMOTHERAPY ALERT CARD or IMMUNOTHERAPY ALERT CARD at check-in to  the Emergency Department and triage nurse. ° °Should you have questions after your visit or need to cancel or reschedule your appointment, please contact [facility]  [phone] and follow the prompts.  Office hours are 8:00 a.m. to 4:30 p.m. Monday - Friday. Please note that voicemails left after 4:00 p.m. may not be returned until the following business day.  We are closed weekends and major holidays. You have access to a nurse at all times for urgent questions. Please call the main number to the clinic [phone] and follow the prompts. ° °For any non-urgent questions, you may also contact your provider using MyChart. We now offer e-Visits for anyone 59 and older to request care online for non-urgent symptoms. For details visit [email or web address]. °  °Also download the MyChart app! Go to the app store, search "MyChart", open the app, select [facility], and log in with your MyChart username and password. ° °Due to Covid, a mask is required upon entering the hospital/clinic. If you do not have a mask, one will be given to you upon arrival. For doctor visits, patients may have 1 support person aged 59 or older with them. For treatment visits, patients cannot have anyone with them due to current Covid guidelines and our immunocompromised population.  °

## 2021-12-15 ENCOUNTER — Other Ambulatory Visit: Payer: Self-pay | Admitting: Gastroenterology

## 2021-12-16 ENCOUNTER — Encounter: Payer: Self-pay | Admitting: Oncology

## 2021-12-19 ENCOUNTER — Telehealth: Payer: Self-pay

## 2021-12-19 NOTE — Telephone Encounter (Signed)
Patient called wanting a refill on his Furosemide and Spironolactone. I told him that I would have to ask Dr. [name]. However, Dr. [name] stated that he did not feel comfortable giving him refills on his Spironolactone since he felt that he needed to see a nephrologist first after looking at his lab levels. He told me to let the patient know that he would have to see a nephrologist. Patient stated that he was already seeing a nephrologist-Dr. Candiss Norse but that it would probably take a month or longer to be seen. I told him that unfortunately Dr. [name] would not refill. Patient then stated that he would call Dr. Candiss Norse and ask if he could refill his Furosemide and Spironolactone.

## 2021-12-27 ENCOUNTER — Inpatient Hospital Stay (HOSPITAL_BASED_OUTPATIENT_CLINIC_OR_DEPARTMENT_OTHER): Payer: Medicare Other | Admitting: Oncology

## 2021-12-27 ENCOUNTER — Encounter: Payer: Self-pay | Admitting: Oncology

## 2021-12-27 ENCOUNTER — Inpatient Hospital Stay: Payer: Medicare Other

## 2021-12-27 VITALS — BP 130/63 | HR 73 | Temp 96.3°F | Resp 16 | Wt 224.0 lb

## 2021-12-27 DIAGNOSIS — Z5112 Encounter for antineoplastic immunotherapy: Secondary | ICD-10-CM

## 2021-12-27 DIAGNOSIS — C642 Malignant neoplasm of left kidney, except renal pelvis: Secondary | ICD-10-CM

## 2021-12-27 DIAGNOSIS — K7469 Other cirrhosis of liver: Secondary | ICD-10-CM

## 2021-12-27 DIAGNOSIS — C649 Malignant neoplasm of unspecified kidney, except renal pelvis: Secondary | ICD-10-CM

## 2021-12-27 DIAGNOSIS — N1832 Chronic kidney disease, stage 3b: Secondary | ICD-10-CM | POA: Diagnosis not present

## 2021-12-27 DIAGNOSIS — D631 Anemia in chronic kidney disease: Secondary | ICD-10-CM

## 2021-12-27 LAB — COMPREHENSIVE METABOLIC PANEL
ALT: 21 U/L (ref 0–44)
AST: 35 U/L (ref 15–41)
Albumin: 3.5 g/dL (ref 3.5–5.0)
Alkaline Phosphatase: 99 U/L (ref 38–126)
Anion gap: 6 (ref 5–15)
BUN: 42 mg/dL — ABNORMAL HIGH (ref 6–20)
CO2: 23 mmol/L (ref 22–32)
Calcium: 9.1 mg/dL (ref 8.9–10.3)
Chloride: 104 mmol/L (ref 98–111)
Creatinine, Ser: 2.27 mg/dL — ABNORMAL HIGH (ref 0.61–1.24)
GFR, Estimated: 33 mL/min — ABNORMAL LOW (ref >60)
Glucose, Bld: 223 mg/dL — ABNORMAL HIGH (ref 70–99)
Potassium: 4 mmol/L (ref 3.5–5.1)
Sodium: 133 mmol/L — ABNORMAL LOW (ref 135–145)
Total Bilirubin: 0.7 mg/dL (ref 0.3–1.2)
Total Protein: 7.5 g/dL (ref 6.5–8.1)

## 2021-12-27 LAB — CBC WITH DIFFERENTIAL/PLATELET
Abs Immature Granulocytes: 0.01 10*3/uL (ref 0.00–0.07)
Basophils Absolute: 0 10*3/uL (ref 0.0–0.1)
Basophils Relative: 1 %
Eosinophils Absolute: 0.1 10*3/uL (ref 0.0–0.5)
Eosinophils Relative: 4 %
HCT: 28 % — ABNORMAL LOW (ref 39.0–52.0)
Hemoglobin: 10 g/dL — ABNORMAL LOW (ref 13.0–17.0)
Immature Granulocytes: 1 %
Lymphocytes Relative: 12 %
Lymphs Abs: 0.3 10*3/uL — ABNORMAL LOW (ref 0.7–4.0)
MCH: 33 pg (ref 26.0–34.0)
MCHC: 35.7 g/dL (ref 30.0–36.0)
MCV: 92.4 fL (ref 80.0–100.0)
Monocytes Absolute: 0.3 10*3/uL (ref 0.1–1.0)
Monocytes Relative: 14 %
Neutro Abs: 1.5 10*3/uL — ABNORMAL LOW (ref 1.7–7.7)
Neutrophils Relative %: 68 %
Platelets: 57 10*3/uL — ABNORMAL LOW (ref 150–400)
RBC: 3.03 MIL/uL — ABNORMAL LOW (ref 4.22–5.81)
RDW: 13.5 % (ref 11.5–15.5)
WBC: 2.2 10*3/uL — ABNORMAL LOW (ref 4.0–10.5)
nRBC: 0 % (ref 0.0–0.2)

## 2021-12-27 MED ORDER — SODIUM CHLORIDE 0.9 % IV SOLN
240.0000 mg | Freq: Once | INTRAVENOUS | Status: AC
  Administered 2021-12-27: 240 mg via INTRAVENOUS
  Filled 2021-12-27: qty 24

## 2021-12-27 MED ORDER — HEPARIN SOD (PORK) LOCK FLUSH 100 UNIT/ML IV SOLN
500.0000 [IU] | Freq: Once | INTRAVENOUS | Status: AC | PRN
  Administered 2021-12-27: 500 [IU]
  Filled 2021-12-27: qty 5

## 2021-12-27 MED ORDER — SODIUM CHLORIDE 0.9 % IV SOLN
INTRAVENOUS | Status: DC
  Filled 2021-12-27: qty 250

## 2021-12-27 MED ORDER — SODIUM CHLORIDE 0.9% FLUSH
10.0000 mL | Freq: Once | INTRAVENOUS | Status: AC
  Administered 2021-12-27: 10 mL via INTRAVENOUS
  Filled 2021-12-27: qty 10

## 2021-12-27 NOTE — Progress Notes (Signed)
Pt in for follow up today. Has some questions regarding medication and refills.

## 2021-12-28 ENCOUNTER — Encounter: Payer: Self-pay | Admitting: Oncology

## 2021-12-28 DIAGNOSIS — N1832 Chronic kidney disease, stage 3b: Secondary | ICD-10-CM | POA: Insufficient documentation

## 2021-12-28 NOTE — Progress Notes (Signed)
Hematology/Oncology Progress note [phone] [phone]      Patient Care Team: Albina Billet, MD as PCP - General (Internal Medicine) Earlie Server, MD as Consulting Physician (Hematology and Oncology)  REFERRING PROVIDER: Albina Billet, MD  CHIEF COMPLAINTS/REASON FOR VISIT:  Follow-up for kidney cancer treatments  HISTORY OF PRESENTING ILLNESS:   Henry Moore is a  61 y.o.  male with PMH listed below was seen in consultation at the request of  Albina Billet, MD  for evaluation of kidney cancer Extensive medical records were reviewed Patient had MRI lumbar spine without contrast done on 07/13/2018 which showed mired lumbar degenerative disease. CT thoracic spine was done on 08/05/2018 which showed 5 cm left renal mass. 09/04/2018 CT abdomen and pelvis with and without contrast showed 5 cm round enhancing solid mass in the left kidney.  No involvement of left renal vein.  No lymphadenopathy Morphologic changes consistent with cirrhosis and the portal hypertension.  Small volume ascites and splenomegaly. . 10/16/2018 Left nephrectomy showed renal cell carcinoma, clear-cell type, nuclear grade 4, tumor extends into the renal vein and renal sinus fat.  Urethral, vascular and or resection margins are negative for tumor pT3a Nx Mx  01/28/2019 patient had another CT chest abdomen pelvis done with contrast Showed interval left nephrectomy.  Scattered tiny pulmonary nodules bilaterally.  Nonspecific.  No other evidence of metastatic disease.  Cirrhosis changes extensive coronary and aortic atherosclerosis  07/14/2019 patient underwent surveillance CT chest abdomen pelvis without contrast Interval progression of pulmonary metastasis with new enlarged right paratracheal lymph node 1.7 cm, previously 0.6 cm one-point since worrisome for metastatic adenopathy.  Multiple progressive pulmonary nodules, index nodule within the lingula measures 1.3 cm, previously 3 mm. Index subpleural  nodule within the anterior right upper lobe measuring 1.8 cm, previously 3 cm Index nodule within the post lateral right lower lobe measuring 5 mm, new from previous care Aortic sclerosis.  Three-vessel coronary artery calcification noted.  Cirrhosis changes.  Splenomegaly.  Patient was referred to establish care with medical oncology for further discussion and evaluation of metastatic renal cell carcinoma. Patient reports feeling well at baseline today.  Denies any shortness of breath, cough, hemoptysis, back pain, headache. He quitted smoking 2015.  Not currently actively drinking alcohol as well. Patient has chronic back pain unchanged.  #08/14/2019-10/16/2019 nivolumab and ipilimumab. #11/06/2019-nivolumab every 2 weeks maintenance.  # Liver cirrhosis with small amount of ascites/splenomegaly patient sees gastroenterology Dr. [name].  . He will get ultrasound surveillance due in February 2021 for screening of Britt.  EGD surveillance for Barrett's plan in April 2021.  # Diabetes, on Humalog sliding scale and metformin.  #Family history of cancer and personal history of RCC.  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  Status post ipilimumab and nivolumab treatment.  # 10/29/2019 CT chest abdomen pelvis without contrast images were independently reviewed by me and discussed with patient.   CT showed marked improvement with resolution of many of the pulmonary nodules, prominent reduced size of other nodules.  Considerably reduced size of the right lower paratracheal lymph node now 0.9 cm in diameter. Hepatic cirrhosis with prominent splenomegaly and trace perisplenic ascites. Chronic CT findings includes aortic atherosclerotic vascular disease.  Mild sigmoid  colon diverticulosis  # CKD, he  establish care with Dr. Candiss Norse for chronic kidney disease.  Blood pressure medication has been adjusted.  Hydrochlorothiazide decreased to 12.5 mg daily.  # NGS -foundation one PD-L1 TPS 0% no reportable mutation, MS stable. TMB 8 muts/mb VHL D12f*16, SETD2 BAP Genetic testing is negative.   # 07/16/2020, CT chest abdomen pelvis images were reviewed and discussed with patient Compared to July 2021 image, he has had a new patchy groundglass opacities throughout both lungs with central part of solids/airspace components.  Findings are nonspecific and could be secondary to an atypical infection, including viral pneumonia or inflammation.  Patient is asymptomatic.  I wonder the changes are secondary to his Covid infection.  Patient has been started on Levaquin to cover atypical infection by on-call physician and I recommend him to continue and complete the course. Proceed with today's immunotherapy nivolumab.  Advised patient to notify me if his symptoms get worse.  # 10/12/2020 CT chest abdomen pelvis with out contrast. New 15 mm lytic lesion in the right T9 vertebral body.  Suspicious for isolated lytic metastasis.  No findings of suspicious soft tissue metastasis in the chest abdomen or pelvis.  Residual postinfectious inflammatory scarring in the lung bilaterally.  Cirrhosis.  Splenomegaly.  Minimal ascites.  # 10/26/20 Bone scan showed Minimal increased activity noted at the right costovertebral junction at the T9 level is most likely degenerative. Similar finding noted on prior bone scan of 08/11/2019. No definite T9 vertebral body abnormal activity corresponding to lytic lesion noted on recent CT. Again the recently identified T9 vertebral body lytic lesion is suspicious and further evaluation of the thoracic spine with MRI can be obtained. No other bony abnormalities noted by bone scan. I discussed patient's case on tumor board 11/04/2020.  Bone scan probably is not  sensitive in detecting lytic lesion from [place]  Consensus reached upon clinically based on the CT scan, new bone lesion is highly likely [place]  Options including proceed with bone lesion biopsy versus referral to radiation oncology for empiric palliative radiation.  Patient has other comorbidities including cirrhosis/pancytopenia, patient opts to proceed with empiric palliative radiation.  12/13/2020 finished RT to T9 June 2022 abscess of anal area , s/ drainage and antibiotics. July 2022, immunotherapy was held due to diarrhea.  02/01/2021  mild wall thickening of aseconding colon. Small infraumbilical ventral hernia,contains short segment of small bowel. Cirrohsis and splenomegaly. Small volume ascites, slight increase 02/03/21 chest wo contrast showed  Lytic lesion within T9 slightly increased in size., no new skeletal metastasis. Mild peripheral ground glass densities.   03/18/2021, resumed on nivolumab treatment x1. 04/01/2021, hold nivolumab due to diarrhea and upcoming surgery. # 04/08/2021 paracentesis. Cytology negative for malignancy.  # [facility]14[facility]2021[facility]. S/p paracentesis on 04/15/2021 , cytology negative.  # 04/22/21 right T9 transpedicular mass resection. [facility]7-11 [facility]2021 abdominal distention. S/p repeat therapeutic paracentesis.   05/30/2021, CT chest abdomen pelvis showed 8 mm left upper lobe pulmonary nodule, increased size compared to 2 mm previously.  Interval progression highly concerning for metastatic disease.  Primary bronchogenic neoplasm is also another consideration. Interval thoracic fusion above and below the T10.  Previously described tiny lucency inferior L3 vertebral body is stable.  Cirrhosis with splenomegaly and small volume ascites.  Cholelithiasis.  Aortic atherosclerosis  #08/12/2021, status post stereotactic radiation to lung  09/14/2021, CT chest abdomen pelvis  without contrast showed 0.8 cm subpleural nodule of the left  lower lobe, previously 0.5 cm.  Other small nodules are unchanged.  Unchanged lytic lesion of T9 with bridging fusion of the lower thoracic spine.  Unchanged tiny lucent lesion of the inferior endplate of L3.  Nonspecific. Unchanged prominent retroperitoneal lymph node and adjacent fat stranding.  Attention on follow-up.  No noncontrast evidence of new soft tissue metastasis in the abdomen or pelvis.  Status post left nephrectomy.  Cirrhosis and portal hypertension with trace perihepatic ascites.  Coronary artery disease  #09/19/2021, resumed on nivolumab treatments.  12/06/2021, interval enlargement of the small right lower lobe pulm nodules, 0.7 cm and 0.6 cm.  Other lung nodules are unchanged.  New groundglass adjacent to the lingular nodule suggesting radiation fibrosis. Unchanged lytic osseous lesion of T9 with posterior laminectomy of T9 and bridging fusion of T7-T11.  Unchanged tiny lytic lesion in the inferior endplate of L3.  Unchanged prominent left retroperitoneal lymph nodes and adjacent fat stranding.  Cirrhosis and portal hypertension with gross splenomegaly.  Small volume of ascites.  Slightly increased.  Coronary artery disease 12/08/2021, bone scan showed Bone metastasis at T9, probably T10. The CT noted inferior endplate small L3 lesion does not have definite increased radiotracer uptake.  Given that both lung nodules are still less than 1 cm, would not be able to be biopsied or radiated.  Recommend observation..  Attention future studies.   INTERVAL HISTORY Henry Moore is a 61 y.o. male who has above history reviewed by me today presents for follow up visit for management of stage IV RCC Patient tolerates immunotherapy.  Denies any diarrhea episodes. He recently called GI office to refill his diuretics was advised to see nephrology Dr. Candiss Norse and had his diuretics refilled. No other new complaints.  Review of Systems   Constitutional:  Positive for fatigue. Negative for appetite change, chills, fever and unexpected weight change.  HENT:   Negative for hearing loss and voice change.   Eyes:  Negative for eye problems and icterus.  Respiratory:  Negative for chest tightness, cough and shortness of breath.   Cardiovascular:  Negative for chest pain and leg swelling.  Gastrointestinal:  Negative for abdominal distention, abdominal pain, diarrhea, nausea and vomiting.  Endocrine: Negative for hot flashes.  Genitourinary:  Negative for difficulty urinating, dysuria and frequency.   Musculoskeletal:  Positive for arthralgias.       Chronic back pain  Skin:  Negative for itching and rash.  Neurological:  Negative for light-headedness and numbness.  Hematological:  Negative for adenopathy. Does not bruise/bleed easily.  Psychiatric/Behavioral:  Negative for confusion.    MEDICAL HISTORY:  Past Medical History:  Diagnosis Date   Cough    SINUS DRAINAGE CAUSING COUGHING , REPORTS CLEAR MUCOUS    Diabetes mellitus without complication (HCC)    Family history of breast cancer    Family history of colon cancer    Family history of stomach cancer    HTN (hypertension)    Hyperlipemia    Left renal mass    Platelets decreased ([place])    DENIES UNSUAL BLEEDING    PONV (postoperative nausea and vomiting)    Renal cell carcinoma ([place]) 08/04/2019   WBC decreased     SURGICAL HISTORY: Past Surgical History:  Procedure Laterality Date   ANKLE SURGERY Left    ANTERIOR CERVICAL DECOMP/DISCECTOMY FUSION N/A 04/16/2014   Procedure: ANTERIOR CERVICAL DECOMPRESSION/DISCECTOMY FUSION  (ACDF C5-C7)   (2 LEVELS)     ;  Surgeon: Melina Schools, MD;  Location: [place];  Service: Orthopedics;  Laterality: N/A;   APPENDECTOMY     BACK SURGERY     ESOPHAGOGASTRODUODENOSCOPY (EGD) WITH PROPOFOL N/A 02/06/2019   Procedure: ESOPHAGOGASTRODUODENOSCOPY (EGD) WITH PROPOFOL;  Surgeon: [name], MD;  Location: [facility];  Service:  Gastroenterology;  Laterality: N/A;   ESOPHAGOGASTRODUODENOSCOPY (EGD) WITH PROPOFOL N/A 03/04/2019   Procedure: ESOPHAGOGASTRODUODENOSCOPY (EGD) WITH PROPOFOL;  Surgeon: Lin Landsman, MD;  Location: [place];  Service: Gastroenterology;  Laterality: N/A;   ESOPHAGOGASTRODUODENOSCOPY (EGD) WITH PROPOFOL N/A 04/22/2019   Procedure: ESOPHAGOGASTRODUODENOSCOPY (EGD) WITH PROPOFOL;  Surgeon: [name], MD;  Location: [facility];  Service: Gastroenterology;  Laterality: N/A;   FRACTURE SURGERY     HYDROCELE EXCISION     KNEE ARTHROSCOPY Bilateral    LAPAROSCOPIC NEPHRECTOMY, HAND ASSISTED Left 10/16/2018   Procedure: LEFT HAND ASSISTED LAPAROSCOPIC NEPHRECTOMY;  Surgeon: Lucas Mallow, MD;  Location: [facility];  Service: Urology;  Laterality: Left;   NASAL SINUS SURGERY     X 2   PENILE PROSTHESIS IMPLANT  10 YEARS AGO   PORTA CATH INSERTION N/A 11/11/2019   Procedure: PORTA CATH INSERTION;  Surgeon: Katha Cabal, MD;  Location: [place] [facility];  Service: Cardiovascular;  Laterality: N/A;   SHOULDER SURGERY Left     SOCIAL HISTORY: Social History   Socioeconomic History   Marital status: Married    Spouse name: Not on file   Number of children: Not on file   Years of education: Not on file   Highest education level: Not on file  Occupational History   Not on file  Tobacco Use   Smoking status: Former    Packs/day: 0.25    Years: 20.00    Pack years: 5.00    Types: Cigarettes    Quit date: 2015    Years since quitting: 8.4   Smokeless tobacco: Never  Vaping Use   Vaping Use: Never used  Substance and Sexual Activity   Alcohol use: Not Currently    Alcohol/week: 0.0 standard drinks    Comment: no alchol in 1 year   Drug use: No   Sexual activity: Not on file  Other Topics Concern   Not on file  Social History Narrative   Not on file   Social Determinants of Health   Financial Resource Strain: Not on file  Food Insecurity: Not on file   Transportation Needs: Not on file  Physical Activity: Not on file  Stress: Not on file  Social Connections: Not on file  Intimate Partner Violence: Not on file    FAMILY HISTORY: Family History  Problem Relation Age of Onset   Diabetes Mother    Cancer Mother        neuroendocrine cancer   Cancer Father        neuroendocrine cancer of meninges   Cancer Maternal Grandmother        tomach dx 52   Alzheimer's disease Paternal Grandmother    Lung cancer Paternal Grandfather    Lung cancer Maternal Aunt    Colon cancer Maternal Uncle    Breast cancer Paternal Aunt        both dx 46s   Ovarian cancer Other        dx 76   Breast cancer Cousin     ALLERGIES:  is allergic to codeine and mucinex [guaifenesin er].  MEDICATIONS:  Current Outpatient Medications  Medication Sig Dispense Refill   ACCU-CHEK AVIVA PLUS test strip CHECK BL00D SUGAR ONCE OR TWICE DAILY. 100  each PRN   calcium carbonate (OS-CAL - DOSED IN MG OF ELEMENTAL CALCIUM) 1250 (500 Ca) MG tablet Take 1 tablet by mouth.     Cholecalciferol (VITAMIN D3) 125 MCG (5000 UT) CAPS Take 5,000 Units by mouth daily.     furosemide (LASIX) 40 MG tablet Take 1 tablet (40 mg total) by mouth daily. 30 tablet 0   glipiZIDE (GLUCOTROL) 5 MG tablet Take 5 mg by mouth daily. Tablet(s) By Mouth Daily     levothyroxine (SYNTHROID) 25 MCG tablet Take 1 tablet (25 mcg total) by mouth daily before breakfast. 30 tablet 3   metFORMIN (GLUCOPHAGE) 500 MG tablet Take 1,000 mg by mouth 2 (two) times daily. 2 Tablet(s) By Mouth Twice Daily     pravastatin (PRAVACHOL) 20 MG tablet Take 1 tablet (20 mg total) by mouth every evening. 30 tablet 11   rifaximin (XIFAXAN) 550 MG TABS tablet Take 1 tablet (550 mg total) by mouth 2 (two) times daily. 60 tablet 11   spironolactone (ALDACTONE) 100 MG tablet Take 1 tablet (100 mg total) by mouth daily. 30 tablet 0   vitamin B-12 (CYANOCOBALAMIN) 1000 MCG tablet Take 1 tablet (1,000 mcg total) by mouth  daily. 90 tablet 0   carvedilol (COREG) 25 MG tablet Take 1 tablet (25 mg total) by mouth 2 (two) times daily with a meal. (Patient not taking: Reported on 10/18/2021) 180 tablet 3   fentaNYL (DURAGESIC) 12 MCG/HR Place 1 patch onto the skin every 3 (three) days. (Patient not taking: Reported on 09/19/2021) 5 patch 0   loperamide (IMODIUM A-D) 2 MG tablet Take 2 mg by mouth 4 (four) times daily as needed for diarrhea or loose stools. (Patient not taking: Reported on 11/01/2021)     prochlorperazine (COMPAZINE) 10 MG tablet Take 1 tablet (10 mg total) by mouth every 6 (six) hours as needed for nausea or vomiting. (Patient not taking: Reported on 12/13/2021) 30 tablet 0   promethazine (PHENERGAN) 25 MG tablet Take 1 tablet (25 mg total) by mouth every 6 (six) hours as needed for nausea or vomiting. (Patient not taking: Reported on 11/01/2021) 30 tablet 0   Current Facility-Administered Medications  Medication Dose Route Frequency Provider Last Rate Last Admin   albumin human 25 % solution 25 g  25 g Intravenous Once [name], MD       Facility-Administered Medications Ordered in Other Visits  Medication Dose Route Frequency Provider Last Rate Last Admin   sodium chloride flush (NS) 0.9 % injection 10 mL  10 mL Intravenous PRN Earlie Server, MD   10 mL at 01/22/20 0831     PHYSICAL EXAMINATION: ECOG PERFORMANCE STATUS: 1 - Symptomatic but completely ambulatory Vitals:   12/27/21 1512  BP: 130/63  Pulse: 73  Resp: 16  Temp: (!) 96.3 F (35.7 C)  SpO2: 100%   Filed Weights   12/27/21 1512  Weight: 224 lb (101.6 kg)    Physical Exam Constitutional:      General: He is not in acute distress.    Appearance: He is obese.  HENT:     Head: Normocephalic and atraumatic.  Eyes:     General: No scleral icterus. Cardiovascular:     Rate and Rhythm: Normal rate and regular rhythm.     Heart sounds: Normal heart sounds.  Pulmonary:     Effort: Pulmonary effort is normal. No respiratory distress.      Breath sounds: No wheezing.  Abdominal:     General: Bowel sounds are normal.  There is no distension.     Palpations: Abdomen is soft.  Musculoskeletal:        General: No deformity. Normal range of motion.     Cervical back: Normal range of motion and neck supple.  Skin:    General: Skin is warm and dry.     Findings: No erythema or rash.  Neurological:     Mental Status: He is alert and oriented to person, place, and time. Mental status is at baseline.     Cranial Nerves: No cranial nerve deficit.     Coordination: Coordination normal.  Psychiatric:        Mood and Affect: Mood normal.    LABORATORY DATA:  I have reviewed the data as listed Lab Results  Component Value Date   WBC 2.2 (L) 12/27/2021   HGB 10.0 (L) 12/27/2021   HCT 28.0 (L) 12/27/2021   MCV 92.4 12/27/2021   PLT 57 (L) 12/27/2021   Recent Labs    04/14/21 1656 04/15/21 1118 11/29/21 0758 12/13/21 0847 12/27/21 1422  NA 132*   < > 133* 134* 133*  K 5.1   < > 3.6 3.6 4.0  CL 105   < > 100 103 104  CO2 21*   < > 25 22 23   GLUCOSE 140*   < > 174* 126* 223*  BUN 32*   < > 35* 34* 42*  CREATININE 1.75*   < > 2.34* 2.19* 2.27*  CALCIUM 8.5*   < > 9.5 9.5 9.1  GFRNONAA 45*   < > 31* 34* 33*  PROT 6.4*   < > 7.5 7.8 7.5  ALBUMIN 2.9*   < > 3.7 3.8 3.5  AST 36   < > 29 29 35  ALT 23   < > 17 19 21   ALKPHOS 86   < > 106 110 99  BILITOT 1.0   < > 1.1 0.9 0.7  BILIDIR 0.2  --   --   --   --   IBILI 0.8  --   --   --   --    < > = values in this interval not displayed.    Iron/TIBC/Ferritin/ %Sat    Component Value Date/Time   IRON 48 11/29/2021 0758   IRON 105 10/15/2018 1325   TIBC 298 11/29/2021 0758   TIBC 244 (L) 10/15/2018 1325   FERRITIN 117 11/29/2021 0758   FERRITIN 588 (H) 10/15/2018 1325   IRONPCTSAT 16 (L) 11/29/2021 0758   IRONPCTSAT 43 10/15/2018 1325      RADIOGRAPHIC STUDIES: I have personally reviewed the radiological images as listed and agreed with the findings in the  report. NM Bone Scan Whole Body  Result Date: 12/08/2021 CLINICAL DATA:  History of renal cell carcinoma with known osseous metastasis. EXAM: NUCLEAR MEDICINE WHOLE BODY BONE SCAN TECHNIQUE: Whole body anterior and posterior images were obtained approximately 3 hours after intravenous injection of radiopharmaceutical. RADIOPHARMACEUTICALS:  21.17 mCi Technetium-43mMDP IV COMPARISON:  CT chest abdomen pelvis Dec 06, 2021 FINDINGS: Abnormal increased radiotracer uptake is identified at T9 and probably T10 vertebral bodies suspicious for bony metastasis. Mild degenerative uptake of bilateral shoulders, elbows are noted. 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 The CT noted inferior endplate small L3 lesion does not have definite increased radiotracer uptake. Electronically Signed   By: WAbelardo DieselM.D.   On: 12/08/2021 11:57   CT CHEST ABDOMEN PELVIS WO CONTRAST  Result Date: 12/07/2021 CLINICAL DATA:  Metastatic renal cell  carcinoma restaging * Tracking Code: BO * EXAM: CT CHEST, ABDOMEN AND PELVIS WITHOUT CONTRAST TECHNIQUE: Multidetector CT imaging of the chest, abdomen and pelvis was performed following the standard protocol without IV contrast. RADIATION DOSE REDUCTION: This exam was performed according to the departmental dose-optimization program which includes automated exposure control, adjustment of the mA and/or kV according to patient size and/or use of iterative reconstruction technique. COMPARISON:  09/14/2021 FINDINGS: CT CHEST FINDINGS Cardiovascular: Right chest port catheter. Aortic atherosclerosis. Normal heart size. Three-vessel coronary artery calcifications. No pericardial effusion. Mediastinum/Nodes: No enlarged mediastinal, hilar, or axillary lymph nodes. Thyroid gland, trachea, and esophagus demonstrate no significant findings. Lungs/Pleura: Unchanged 0.7 cm nodule of the posterior lingula (series 4, image 82). There is new irregular ground-glass and bandlike consolidation in  the vicinity of this nodule (series 4, image 87). Unchanged 0.8 cm nodule of the left lower lobe posterior to the left hilum (series 4, image 70). Slight interval enlargement of a 0.6 cm nodule of the peripheral right lower lobe, previously 0.4 cm (series 4, image 104). Interval enlargement of a nodule anteriorly in the right lower lobe measuring 0.7 cm, previously 0.3 cm (series 4, image 105). No pleural effusion or pneumothorax. Musculoskeletal: No chest wall mass. Unchanged lytic osseous lesion of the T9 vertebral body with posterior laminectomy of T9 and bridging fusion of T7 through T11 (series 6, image 116). CT ABDOMEN PELVIS FINDINGS Hepatobiliary: Coarse, nodular cirrhotic morphology of the liver. No obvious solid liver abnormality is seen. No gallstones, gallbladder wall thickening, or biliary dilatation. Pancreas: Unremarkable. No pancreatic ductal dilatation or surrounding inflammatory changes. Spleen: Splenomegaly, maximum coronal span 20.9 cm. Adrenals/Urinary Tract: Adrenal glands are unremarkable. Status post left nephrectomy. Normal noncontrast appearance of the right kidney, without renal calculi, solid lesion, or hydronephrosis. Bladder is unremarkable. Stomach/Bowel: Stomach is within normal limits. Appendix is not clearly visualized. No evidence of bowel wall thickening, distention, or inflammatory changes. Vascular/Lymphatic: Aortic atherosclerosis. Unchanged prominent left retroperitoneal lymph nodes measuring up to 1.1 x 0.8 cm (series 2, image 74). Adjacent retroperitoneal fat stranding. Reproductive: No mass or other abnormality. Penile implant with left lower quadrant reservoir. Other: No abdominal wall hernia or abnormality. Small volume ascites throughout the abdomen and pelvis, slightly increased compared to prior examination. Musculoskeletal: No acute osseous findings. Unchanged tiny lytic lesion of the inferior endplate of L3, again suspicious for a second small osseous metastasis  (series 6, image 117). IMPRESSION: 1. Interval enlargement small right lower lobe nodules, consistent with enlargement pulmonary metastases. Left lower lobe and lingular nodules are unchanged, with new ground-glass adjacent to the lingular nodule suggesting developing radiation fibrosis. 2. Status post left nephrectomy. 3. Unchanged lytic osseous lesion of the T9 vertebral body with posterior laminectomy of T9 and bridging fusion of T7 through T11. 4. Unchanged tiny lytic lesion of the inferior endplate of L3, again suspicious for a second small osseous metastasis. 5. Unchanged prominent left retroperitoneal lymph nodes and adjacent fat stranding, nonspecific in the setting of cirrhosis. 6. Morphologic stigmata of cirrhosis and portal hypertension with gross splenomegaly. Small volume ascites throughout the abdomen pelvis, slightly increased compared to prior examination. 7. Coronary artery disease. Aortic Atherosclerosis (ICD10-I70.0). Electronically Signed   By: Delanna Ahmadi M.D.   On: 12/07/2021 13:41      ASSESSMENT & PLAN:  1. Renal cell carcinoma of left kidney (HCC)   2. Encounter for antineoplastic immunotherapy   3. Other cirrhosis of liver ([place])   4. Anemia due to stage 3b chronic kidney disease (  [place])   5. 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 Labs reviewed and discussed with patient Proceed with nivolumab. he has follow-up appointment with Dr. Baruch Gouty in June.   # T9 lesion, s/p resection and spine fusion.  Pathology is positive for RCC. S/p palliative RT.  Continue calcium [1200 mg] and vitamin D supplementation. Xgeva monthly.    #  liver cirrhosis, recurrent ascites, stable symptoms.  Recommend patient to continue follow-up with GI.  Continue Lasix and spironolactone.  Patient got refills from nephrology.  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  #Chronic kidney disease, stable Cr. Encourage oral hydration and avoid nephrotoxin.  # Anemia due to CKD, iron saturation is borderline 16, ferritin level 117. Hemoglobin is at 10.  Recommend patient to start oral iron supplementation.   Follow-up in 2 weeks. Lab MD nivolumab   All questions were answered. The patient knows to call the clinic with any problems questions or concerns.   Earlie Server, MD, PhD 12/28/2021

## 2021-12-30 ENCOUNTER — Encounter: Payer: Self-pay | Admitting: Oncology

## 2022-01-03 ENCOUNTER — Encounter: Payer: Self-pay | Admitting: Oncology

## 2022-01-10 ENCOUNTER — Inpatient Hospital Stay: Payer: BC Managed Care – PPO | Attending: Hospice and Palliative Medicine

## 2022-01-10 ENCOUNTER — Encounter: Payer: Self-pay | Admitting: Oncology

## 2022-01-10 ENCOUNTER — Inpatient Hospital Stay (HOSPITAL_BASED_OUTPATIENT_CLINIC_OR_DEPARTMENT_OTHER): Payer: BC Managed Care – PPO | Admitting: Oncology

## 2022-01-10 ENCOUNTER — Inpatient Hospital Stay: Payer: BC Managed Care – PPO

## 2022-01-10 DIAGNOSIS — N1832 Chronic kidney disease, stage 3b: Secondary | ICD-10-CM | POA: Diagnosis not present

## 2022-01-10 DIAGNOSIS — D696 Thrombocytopenia, unspecified: Secondary | ICD-10-CM | POA: Diagnosis not present

## 2022-01-10 DIAGNOSIS — D631 Anemia in chronic kidney disease: Secondary | ICD-10-CM

## 2022-01-10 DIAGNOSIS — Z7984 Long term (current) use of oral hypoglycemic drugs: Secondary | ICD-10-CM | POA: Insufficient documentation

## 2022-01-10 DIAGNOSIS — Z923 Personal history of irradiation: Secondary | ICD-10-CM | POA: Insufficient documentation

## 2022-01-10 DIAGNOSIS — C642 Malignant neoplasm of left kidney, except renal pelvis: Secondary | ICD-10-CM

## 2022-01-10 DIAGNOSIS — Z87891 Personal history of nicotine dependence: Secondary | ICD-10-CM | POA: Diagnosis not present

## 2022-01-10 DIAGNOSIS — D6959 Other secondary thrombocytopenia: Secondary | ICD-10-CM | POA: Insufficient documentation

## 2022-01-10 DIAGNOSIS — Z5112 Encounter for antineoplastic immunotherapy: Secondary | ICD-10-CM | POA: Insufficient documentation

## 2022-01-10 DIAGNOSIS — C7951 Secondary malignant neoplasm of bone: Secondary | ICD-10-CM

## 2022-01-10 DIAGNOSIS — C7800 Secondary malignant neoplasm of unspecified lung: Secondary | ICD-10-CM | POA: Diagnosis not present

## 2022-01-10 DIAGNOSIS — K7460 Unspecified cirrhosis of liver: Secondary | ICD-10-CM | POA: Insufficient documentation

## 2022-01-10 DIAGNOSIS — R188 Other ascites: Secondary | ICD-10-CM

## 2022-01-10 DIAGNOSIS — C649 Malignant neoplasm of unspecified kidney, except renal pelvis: Secondary | ICD-10-CM

## 2022-01-10 DIAGNOSIS — Z79899 Other long term (current) drug therapy: Secondary | ICD-10-CM | POA: Insufficient documentation

## 2022-01-10 DIAGNOSIS — Z905 Acquired absence of kidney: Secondary | ICD-10-CM | POA: Diagnosis not present

## 2022-01-10 LAB — COMPREHENSIVE METABOLIC PANEL
ALT: 23 U/L (ref 0–44)
AST: 35 U/L (ref 15–41)
Albumin: 3.4 g/dL — ABNORMAL LOW (ref 3.5–5.0)
Alkaline Phosphatase: 103 U/L (ref 38–126)
Anion gap: 8 (ref 5–15)
BUN: 31 mg/dL — ABNORMAL HIGH (ref 6–20)
CO2: 20 mmol/L — ABNORMAL LOW (ref 22–32)
Calcium: 8.2 mg/dL — ABNORMAL LOW (ref 8.9–10.3)
Chloride: 101 mmol/L (ref 98–111)
Creatinine, Ser: 1.96 mg/dL — ABNORMAL HIGH (ref 0.61–1.24)
GFR, Estimated: 39 mL/min — ABNORMAL LOW (ref >60)
Glucose, Bld: 187 mg/dL — ABNORMAL HIGH (ref 70–99)
Potassium: 4.4 mmol/L (ref 3.5–5.1)
Sodium: 129 mmol/L — ABNORMAL LOW (ref 135–145)
Total Bilirubin: 0.9 mg/dL (ref 0.3–1.2)
Total Protein: 7.1 g/dL (ref 6.5–8.1)

## 2022-01-10 LAB — CBC WITH DIFFERENTIAL/PLATELET
Abs Immature Granulocytes: 0.03 10*3/uL (ref 0.00–0.07)
Basophils Absolute: 0 10*3/uL (ref 0.0–0.1)
Basophils Relative: 1 %
Eosinophils Absolute: 0.1 10*3/uL (ref 0.0–0.5)
Eosinophils Relative: 5 %
HCT: 28.2 % — ABNORMAL LOW (ref 39.0–52.0)
Hemoglobin: 9.8 g/dL — ABNORMAL LOW (ref 13.0–17.0)
Immature Granulocytes: 1 %
Lymphocytes Relative: 10 %
Lymphs Abs: 0.2 10*3/uL — ABNORMAL LOW (ref 0.7–4.0)
MCH: 32.7 pg (ref 26.0–34.0)
MCHC: 34.8 g/dL (ref 30.0–36.0)
MCV: 94 fL (ref 80.0–100.0)
Monocytes Absolute: 0.3 10*3/uL (ref 0.1–1.0)
Monocytes Relative: 11 %
Neutro Abs: 1.7 10*3/uL (ref 1.7–7.7)
Neutrophils Relative %: 72 %
Platelets: 61 10*3/uL — ABNORMAL LOW (ref 150–400)
RBC: 3 MIL/uL — ABNORMAL LOW (ref 4.22–5.81)
RDW: 13.7 % (ref 11.5–15.5)
WBC: 2.4 10*3/uL — ABNORMAL LOW (ref 4.0–10.5)
nRBC: 0 % (ref 0.0–0.2)

## 2022-01-10 LAB — T4, FREE: Free T4: 1.21 ng/dL — ABNORMAL HIGH (ref 0.61–1.12)

## 2022-01-10 LAB — TSH: TSH: 2.936 u[IU]/mL (ref 0.350–4.500)

## 2022-01-10 MED ORDER — HEPARIN SOD (PORK) LOCK FLUSH 100 UNIT/ML IV SOLN
500.0000 [IU] | Freq: Once | INTRAVENOUS | Status: AC | PRN
  Administered 2022-01-10: 500 [IU]
  Filled 2022-01-10: qty 5

## 2022-01-10 MED ORDER — SODIUM CHLORIDE 0.9 % IV SOLN
240.0000 mg | Freq: Once | INTRAVENOUS | Status: AC
  Administered 2022-01-10: 240 mg via INTRAVENOUS
  Filled 2022-01-10: qty 24

## 2022-01-10 MED ORDER — SODIUM CHLORIDE 0.9 % IV SOLN
Freq: Once | INTRAVENOUS | Status: AC
  Filled 2022-01-10: qty 250

## 2022-01-10 NOTE — Assessment & Plan Note (Signed)
Continue oral iron supplementation  hemoglobin stable.  Monitor counts. 

## 2022-01-10 NOTE — Assessment & Plan Note (Signed)
Continue follow-up with gastroenterology.  Continue Lasix and spironolactone. 

## 2022-01-10 NOTE — Assessment & Plan Note (Signed)
T9 lesion, s/p resection and spine fusion.  Pathology is positive for RCC. S/p palliative RT.  Continue calcium [1200 mg] and vitamin D supplementation. Xgeva monthly. Hold off Xgeva today due to low calcium level.

## 2022-01-10 NOTE — Assessment & Plan Note (Signed)
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 Labs reviewed and discussed with patient Proceed with nivolumab. he has follow-up appointment with Dr. Chrystal in June. 

## 2022-01-10 NOTE — Patient Instructions (Signed)
[facility] [place]  Discharge Instructions: Thank you for choosing [facility] to provide your oncology and hematology care.   If you have a lab appointment with the Cancer Center, please go directly to the Cancer Center and check in at the registration area.   Wear comfortable clothing and clothing appropriate for easy access to any Portacath or PICC line.   We strive to give you quality time with your provider. You may need to reschedule your appointment if you arrive late (15 or more minutes).  Arriving late affects you and other patients whose appointments are after yours.  Also, if you miss three or more appointments without notifying the office, you may be dismissed from the clinic at the provider's discretion.      For prescription refill requests, have your pharmacy contact our office and allow 72 hours for refills to be completed.    Today you received the following chemotherapy and/or immunotherapy agents       To help prevent nausea and vomiting after your treatment, we encourage you to take your nausea medication as directed.  BELOW ARE SYMPTOMS THAT SHOULD BE REPORTED IMMEDIATELY: *FEVER GREATER THAN 100.4 F (38 C) OR HIGHER *CHILLS OR SWEATING *NAUSEA AND VOMITING THAT IS NOT CONTROLLED WITH YOUR NAUSEA MEDICATION *UNUSUAL SHORTNESS OF BREATH *UNUSUAL BRUISING OR BLEEDING *URINARY PROBLEMS (pain or burning when urinating, or frequent urination) *BOWEL PROBLEMS (unusual diarrhea, constipation, pain near the anus) TENDERNESS IN MOUTH AND THROAT WITH OR WITHOUT PRESENCE OF ULCERS (sore throat, sores in mouth, or a toothache) UNUSUAL RASH, SWELLING OR PAIN  UNUSUAL VAGINAL DISCHARGE OR ITCHING   Items with * indicate a potential emergency and should be followed up as soon as possible or go to the Emergency Department if any problems should occur.  Please show the CHEMOTHERAPY ALERT CARD or IMMUNOTHERAPY ALERT CARD at check-in to the  Emergency Department and triage nurse.  Should you have questions after your visit or need to cancel or reschedule your appointment, please contact [facility]  Dept: [phone]  and follow the prompts.  Office hours are 8:00 a.m. to 4:30 p.m. Monday - Friday. Please note that voicemails left after 4:00 p.m. may not be returned until the following business day.  We are closed weekends and major holidays. You have access to a nurse at all times for urgent questions. Please call the main number to the clinic Dept: [phone] and follow the prompts.   For any non-urgent questions, you may also contact your provider using MyChart. We now offer e-Visits for anyone 18 and older to request care online for non-urgent symptoms. For details visit [email or web address].   Also download the MyChart app! Go to the app store, search "MyChart", open the app, select [facility], and log in with your MyChart username and password.  Masks are optional in the cancer centers. If you would like for your care team to wear a mask while they are taking care of you, please let them know. For doctor visits, patients may have with them one support person who is at least 59 years old. At this time, visitors are not allowed in the infusion area. 

## 2022-01-10 NOTE — Progress Notes (Signed)
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 Labs reviewed and discussed with patient Proceed with nivolumab. he has follow-up appointment with Dr. Baruch Gouty in June.  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  Count is stable.  Monitor  Anemia due to stage 3b chronic kidney disease (HCC) Continue oral iron supplementation  hemoglobin stable.  Monitor counts.  Cirrhosis of liver with ascites ([place]) Continue follow-up with gastroenterology.  Continue Lasix and spironolactone.  Metastasis to bone (HCC) T9 lesion, s/p resection and spine fusion.  Pathology is positive for RCC. S/p palliative RT.  Continue calcium [1200 mg] and vitamin D supplementation. Xgeva monthly. Hold off Xgeva today due to low calcium level.  No orders of the defined types were placed in this encounter.   All questions were answered. The patient knows to call the clinic with any problems, questions or concerns.  Earlie Server, MD, PhD [facility] Hematology Oncology 01/10/2022     CHIEF COMPLAINTS/REASON FOR VISIT:  Follow-up for kidney cancer treatments  HISTORY OF PRESENTING ILLNESS:   Henry Moore is a  61 y.o.  male with PMH listed below was seen in consultation at the request of  Albina Billet, MD  for follow up of  kidney cancer Oncology History  Renal cell carcinoma ([place])  10/16/2018 Initial Diagnosis   Renal cell carcinoma -10/16/2018 Left nephrectomy showed renal cell carcinoma, clear-cell type, nuclear grade 4, tumor extends into the renal vein and renal sinus fat.  Urethral, vascular and or resection margins are negative for tumor pT3a Nx Mx NGS  -foundation one PD-L1 TPS 0% no reportable mutation, MS stable. TMB 8 muts/mb VHL D176f*16, SETD2 BAP   07/14/2019 Cancer Staging   Staging form: Kidney, AJCC 8th Edition - Clinical stage from 07/14/2019: Stage IV (cTX, cNX, cM1) - Signed by YEarlie Server MD on 08/05/2020   07/14/2019 Progression   01/28/2019 patient had another CT chest abdomen pelvis done with contrast Showed interval left nephrectomy.  Scattered tiny pulmonary nodules bilaterally.  Nonspecific.  No other evidence of metastatic disease.  Cirrhosis changes extensive coronary and aortic atherosclerosis 07/14/2019 patient underwent surveillance CT chest abdomen pelvis without contrast Interval progression of pulmonary metastasis with new enlarged right paratracheal lymph node 1.7 cm, previously 0.6 cm one-point since worrisome for metastatic adenopathy.  Multiple progressive pulmonary nodules, index nodule within the lingula measures 1.3 cm, previously 3 mm. Index subpleural nodule within the anterior right upper lobe measuring 1.8 cm, previously 3 cm Index nodule within the post lateral right lower lobe measuring 5 mm, new from previous care Aortic sclerosis.  Three-vessel coronary artery calcification noted.  Cirrhosis changes.  Splenomegaly.     08/14/2019 -  Chemotherapy   #08/14/2019-10/16/2019 nivolumab and ipilimumab Q21D #11/06/2019-nivolumab every 2 weeks maintenance.     10/05/2019 Genetic Testing   Negative genetic testing. No pathogenic variants identified on the [facility] Multi-Cancer Panel. VUS in [place]  c.4922T>C (p.Leu1641Pro) identified. The report date is 10/05/2019.  The Multi-Cancer Panel offered by [facility] includes sequencing and/or deletion duplication testing of the following 85 genes: AIP, ALK, APC, ATM, AXIN2,BAP1,  BARD1, BLM, BMPR1A, BRCA1, BRCA2, BRIP1, CASR, CDC73, CDH1, CDK4, CDKN1B, CDKN1C, CDKN2A (p14ARF), CDKN2A (p16INK4a), CEBPA, CHEK2,  CTNNA1, DICER1, DIS3L2, EGFR (c.2369C>T, p.Thr790Met variant  only), EPCAM (Deletion/duplication testing only), FH, FLCN, GATA2, GPC3, GREM1 (Promoter region deletion/duplication testing only), HOXB13 (c.251G>A, p.Gly84Glu), HRAS, KIT, MAX, MEN1, MET, MITF (c.952G>A, p.Glu318Lys variant only), MLH1, MSH2, MSH3, MSH6, MUTYH, NBN, NF1, NF2, NTHL1, PALB2, PDGFRA, PHOX2B, PMS2, POLD1, POLE, POT1, PRKAR1A, PTCH1, PTEN, RAD50, RAD51C, RAD51D, RB1, RECQL4, RET, RNF43, RUNX1, SDHAF2, SDHA (sequence changes only), SDHB, SDHC, SDHD, SMAD4, SMARCA4, SMARCB1, SMARCE1, STK11, SUFU, TERC, TERT, TMEM127, TP53, TSC1, TSC2, VHL, WRN and WT1.    10/12/2020 Imaging   10/12/2020 CT chest abdomen pelvis with out contrast. New 15 mm lytic lesion in the right T9 vertebral body.  Suspicious for isolated lytic metastasis.  No findings of suspicious soft tissue metastasis in the chest abdomen or pelvis.  Residual postinfectious inflammatory scarring in the lung bilaterally.  Cirrhosis.  Splenomegaly.  Minimal ascites.   # 10/26/20 Bone scan showed Minimal increased activity noted at the right costovertebral junction at the T9 level is most likely degenerative. Similar finding noted on prior bone scan of 08/11/2019. No definite T9 vertebral body abnormal activity corresponding to lytic lesion noted on recent CT. Again the recently identified T9 vertebral body lytic lesion is suspicious and further evaluation of the thoracic spine with MRI can be obtained. No other bony abnormalities noted by bone scan. I discussed patient's case on tumor board 11/04/2020.  Bone scan probably is not sensitive in detecting lytic lesion from [place].  Consensus reached upon clinically based on the CT scan, new bone lesion is highly likely metastasis from [place].  Options including proceed with bone lesion biopsy versus referral to radiation oncology for empiric palliative radiation.  Patient has other comorbidities including cirrhosis/pancytopenia, patient opts to proceed with empiric palliative radiation.   12/13/2020 -   Radiation Therapy   finished RT to T9   12/2020 [facility] Admission   June 2022 abscess of anal area , s/ drainage and antibiotics. July 2022, immunotherapy was held due to diarrhea.     03/18/2021 -  Chemotherapy   03/18/2021, resumed on nivolumab treatment x1. 04/01/2021, hold nivolumab due to diarrhea and upcoming surgery. # 04/08/2021 paracentesis. Cytology negative for malignancy.  # 04/14/2021- 04/16/2021 admitted to [facility] due to abdominal distension. S/p paracentesis on 04/15/2021 , cytology negative.  # 04/22/21 right T9 transpedicular mass resection. T7-11 posterior spinal fusion by Dr. Cari Caraway at [facility] # 05/06/2021 abdominal distention. S/p repeat therapeutic paracentesis.    05/30/2021 Imaging    CT chest abdomen pelvis showed 8 mm left upper lobe pulmonary nodule, increased size compared to 2 mm previously.  Interval progression highly concerning for metastatic disease.  Primary bronchogenic neoplasm is also another consideration. Interval thoracic fusion above and below the T10.  Previously described tiny lucency inferior L3 vertebral body is stable.  Cirrhosis with splenomegaly and small volume ascites.  Cholelithiasis.  Aortic atherosclerosis   08/12/2021 -  Radiation Therapy   status post stereotactic radiation to lung   09/14/2021 Imaging   CT chest abdomen pelvis without contrast showed 0.8 cm subpleural nodule of the left lower lobe, previously 0.5 cm.  Other small nodules are unchanged.  Unchanged lytic lesion of T9 with bridging fusion of the lower thoracic spine.  Unchanged tiny lucent lesion of the inferior endplate of L3.  Nonspecific. Unchanged prominent retroperitoneal lymph node and adjacent fat stranding.  Attention on follow-up.  No noncontrast evidence of new soft tissue metastasis in the abdomen or pelvis.  Status post left nephrectomy.  Cirrhosis and  portal hypertension with trace perihepatic ascites.  Coronary artery disease   12/06/2021 Imaging   CT showed interval  enlargement of the small right lower lobe pulm nodules, 0.7 cm and 0.6 cm.  Other lung nodules are unchanged.  New groundglass adjacent to the lingular nodule suggesting radiation fibrosis. Unchanged lytic osseous lesion of T9 with posterior laminectomy of T9 and bridging fusion of T7-T11.  Unchanged tiny lytic lesion in the inferior endplate of L3.  Unchanged prominent left retroperitoneal lymph nodes and adjacent fat stranding.  Cirrhosis and portal hypertension with gross splenomegaly.  Small volume of ascites.  Slightly increased.  Coronary artery disease 12/08/2021, bone scan showed Bone metastasis at T9, probably T10. The CT noted inferior endplate small L3 lesion does not have definite increased radiotracer uptake.   Given that both lung nodules are still less than 1 cm, would not be able to be biopsied or radiated.  Recommend observation..  Attention future studies.   Metastasis to bone ([place])  01/10/2022 Initial Diagnosis   Metastasis to bone [facility])        INTERVAL HISTORY PRECILIANO CASTELL is a 61 y.o. male who has above history reviewed by me today presents for follow up visit for management of stage IV RCC Reports feeling well at baseline.  No new complaints.  Denies any nausea vomiting diarrhea, shortness of breath.  Chronic back pain unchanged.  Review of Systems  Constitutional:  Positive for fatigue. Negative for appetite change, chills, fever and unexpected weight change.  HENT:   Negative for hearing loss and voice change.   Eyes:  Negative for eye problems and icterus.  Respiratory:  Negative for chest tightness, cough and shortness of breath.   Cardiovascular:  Negative for chest pain and leg swelling.  Gastrointestinal:  Negative for abdominal distention, abdominal pain, diarrhea, nausea and vomiting.  Endocrine: Negative for hot flashes.  Genitourinary:  Negative for difficulty urinating, dysuria and frequency.   Musculoskeletal:  Positive for arthralgias.       Chronic  back pain  Skin:  Negative for itching and rash.  Neurological:  Negative for light-headedness and numbness.  Hematological:  Negative for adenopathy. Does not bruise/bleed easily.  Psychiatric/Behavioral:  Negative for confusion.     MEDICAL HISTORY:  Past Medical History:  Diagnosis Date   Cough    SINUS DRAINAGE CAUSING COUGHING , REPORTS CLEAR MUCOUS    Diabetes mellitus without complication (HCC)    Family history of breast cancer    Family history of colon cancer    Family history of stomach cancer    HTN (hypertension)    Hyperlipemia    Left renal mass    Platelets decreased ([place])    DENIES UNSUAL BLEEDING    PONV (postoperative nausea and vomiting)    Renal cell carcinoma ([place]) 08/04/2019   WBC decreased     SURGICAL HISTORY: Past Surgical History:  Procedure Laterality Date   ANKLE SURGERY Left    ANTERIOR CERVICAL DECOMP/DISCECTOMY FUSION N/A 04/16/2014   Procedure: ANTERIOR CERVICAL DECOMPRESSION/DISCECTOMY FUSION  (ACDF C5-C7)   (2 LEVELS)     ;  Surgeon: Melina Schools, MD;  Location: [facility];  Service: Orthopedics;  Laterality: N/A;   APPENDECTOMY     BACK SURGERY     ESOPHAGOGASTRODUODENOSCOPY (EGD) WITH PROPOFOL N/A 02/06/2019   Procedure: ESOPHAGOGASTRODUODENOSCOPY (EGD) WITH PROPOFOL;  Surgeon: [name], MD;  Location: [facility] ENDOSCOPY;  Service: Gastroenterology;  Laterality: N/A;   ESOPHAGOGASTRODUODENOSCOPY (EGD) WITH PROPOFOL N/A 03/04/2019   Procedure: ESOPHAGOGASTRODUODENOSCOPY (  EGD) WITH PROPOFOL;  Surgeon: Lin Landsman, MD;  Location: [facility] ENDOSCOPY;  Service: Gastroenterology;  Laterality: N/A;   ESOPHAGOGASTRODUODENOSCOPY (EGD) WITH PROPOFOL N/A 04/22/2019   Procedure: ESOPHAGOGASTRODUODENOSCOPY (EGD) WITH PROPOFOL;  Surgeon: [name], MD;  Location: [facility] ENDOSCOPY;  Service: Gastroenterology;  Laterality: N/A;   FRACTURE SURGERY     HYDROCELE EXCISION     KNEE ARTHROSCOPY Bilateral    LAPAROSCOPIC NEPHRECTOMY, HAND ASSISTED Left 10/16/2018    Procedure: LEFT HAND ASSISTED LAPAROSCOPIC NEPHRECTOMY;  Surgeon: Lucas Mallow, MD;  Location: [facility];  Service: Urology;  Laterality: Left;   NASAL SINUS SURGERY     X 2   PENILE PROSTHESIS IMPLANT  10 YEARS AGO   PORTA CATH INSERTION N/A 11/11/2019   Procedure: PORTA CATH INSERTION;  Surgeon: Katha Cabal, MD;  Location: [facility] CV LAB;  Service: Cardiovascular;  Laterality: N/A;   SHOULDER SURGERY Left     SOCIAL HISTORY: Social History   Socioeconomic History   Marital status: Married    Spouse name: Not on file   Number of children: Not on file   Years of education: Not on file   Highest education level: Not on file  Occupational History   Not on file  Tobacco Use   Smoking status: Former    Packs/day: 0.25    Years: 20.00    Total pack years: 5.00    Types: Cigarettes    Quit date: 2015    Years since quitting: 8.4   Smokeless tobacco: Never  Vaping Use   Vaping Use: Never used  Substance and Sexual Activity   Alcohol use: Not Currently    Alcohol/week: 0.0 standard drinks of alcohol    Comment: no alchol in 1 year   Drug use: No   Sexual activity: Not on file  Other Topics Concern   Not on file  Social History Narrative   Not on file   Social Determinants of Health   Financial Resource Strain: Not on file  Food Insecurity: Not on file  Transportation Needs: Not on file  Physical Activity: Not on file  Stress: Not on file  Social Connections: Not on file  Intimate Partner Violence: Not on file    FAMILY HISTORY: Family History  Problem Relation Age of Onset   Diabetes Mother    Cancer Mother        neuroendocrine cancer   Cancer Father        neuroendocrine cancer of meninges   Cancer Maternal Grandmother        tomach dx 47   Alzheimer's disease Paternal Grandmother    Lung cancer Paternal Grandfather    Lung cancer Maternal Aunt    Colon cancer Maternal Uncle    Breast cancer Paternal Aunt        both dx 46s   Ovarian  cancer Other        dx 61   Breast cancer Cousin     ALLERGIES:  is allergic to codeine and mucinex [guaifenesin er].  MEDICATIONS:  Current Outpatient Medications  Medication Sig Dispense Refill   ACCU-CHEK AVIVA PLUS test strip CHECK BL00D SUGAR ONCE OR TWICE DAILY. 100 each PRN   calcium carbonate (OS-CAL - DOSED IN MG OF ELEMENTAL CALCIUM) 1250 (500 Ca) MG tablet Take 1 tablet by mouth.     Cholecalciferol (VITAMIN D3) 125 MCG (5000 UT) CAPS Take 5,000 Units by mouth daily.     furosemide (LASIX) 40 MG tablet Take 1 tablet (40  mg total) by mouth daily. 30 tablet 0   glipiZIDE (GLUCOTROL) 5 MG tablet Take 5 mg by mouth daily. Tablet(s) By Mouth Daily     levothyroxine (SYNTHROID) 25 MCG tablet Take 1 tablet (25 mcg total) by mouth daily before breakfast. 30 tablet 3   metFORMIN (GLUCOPHAGE) 500 MG tablet Take 1,000 mg by mouth 2 (two) times daily. 2 Tablet(s) By Mouth Twice Daily     pravastatin (PRAVACHOL) 20 MG tablet Take 1 tablet (20 mg total) by mouth every evening. 30 tablet 11   rifaximin (XIFAXAN) 550 MG TABS tablet Take 1 tablet (550 mg total) by mouth 2 (two) times daily. 60 tablet 11   spironolactone (ALDACTONE) 100 MG tablet Take 1 tablet (100 mg total) by mouth daily. 30 tablet 0   vitamin B-12 (CYANOCOBALAMIN) 1000 MCG tablet Take 1 tablet (1,000 mcg total) by mouth daily. 90 tablet 0   carvedilol (COREG) 25 MG tablet Take 1 tablet (25 mg total) by mouth 2 (two) times daily with a meal. (Patient not taking: Reported on 10/18/2021) 180 tablet 3   fentaNYL (DURAGESIC) 12 MCG/HR Place 1 patch onto the skin every 3 (three) days. (Patient not taking: Reported on 09/19/2021) 5 patch 0   loperamide (IMODIUM A-D) 2 MG tablet Take 2 mg by mouth 4 (four) times daily as needed for diarrhea or loose stools. (Patient not taking: Reported on 11/01/2021)     prochlorperazine (COMPAZINE) 10 MG tablet Take 1 tablet (10 mg total) by mouth every 6 (six) hours as needed for nausea or vomiting.  (Patient not taking: Reported on 12/13/2021) 30 tablet 0   promethazine (PHENERGAN) 25 MG tablet Take 1 tablet (25 mg total) by mouth every 6 (six) hours as needed for nausea or vomiting. (Patient not taking: Reported on 11/01/2021) 30 tablet 0   Current Facility-Administered Medications  Medication Dose Route Frequency Provider Last Rate Last Admin   albumin human 25 % solution 25 g  25 g Intravenous Once [name], MD       Facility-Administered Medications Ordered in Other Visits  Medication Dose Route Frequency Provider Last Rate Last Admin   sodium chloride flush (NS) 0.9 % injection 10 mL  10 mL Intravenous PRN Earlie Server, MD   10 mL at 01/22/20 0831     PHYSICAL EXAMINATION: ECOG PERFORMANCE STATUS: 1 - Symptomatic but completely ambulatory Vitals:   01/10/22 1416  BP: 117/71  Pulse: 75  Resp: 18  Temp: (!) 97.3 F (36.3 C)   Filed Weights   01/10/22 1416  Weight: 222 lb 14.4 oz (101.1 kg)    Physical Exam Constitutional:      General: He is not in acute distress.    Appearance: He is obese.  HENT:     Head: Normocephalic and atraumatic.  Eyes:     General: No scleral icterus. Cardiovascular:     Rate and Rhythm: Normal rate and regular rhythm.     Heart sounds: Normal heart sounds.  Pulmonary:     Effort: Pulmonary effort is normal. No respiratory distress.     Breath sounds: No wheezing.  Abdominal:     General: Bowel sounds are normal. There is no distension.     Palpations: Abdomen is soft.  Musculoskeletal:        General: No deformity. Normal range of motion.     Cervical back: Normal range of motion and neck supple.  Skin:    General: Skin is warm and dry.     Findings:  No erythema or rash.  Neurological:     Mental Status: He is alert and oriented to person, place, and time. Mental status is at baseline.     Cranial Nerves: No cranial nerve deficit.     Coordination: Coordination normal.  Psychiatric:        Mood and Affect: Mood normal.      LABORATORY DATA:  I have reviewed the data as listed Lab Results  Component Value Date   WBC 2.4 (L) 01/10/2022   HGB 9.8 (L) 01/10/2022   HCT 28.2 (L) 01/10/2022   MCV 94.0 01/10/2022   PLT 61 (L) 01/10/2022   Recent Labs    04/14/21 1656 04/15/21 1118 12/13/21 0847 12/27/21 1422 01/10/22 1342  NA 132*   < > 134* 133* 129*  K 5.1   < > 3.6 4.0 4.4  CL 105   < > 103 104 101  CO2 21*   < > 22 23 20*  GLUCOSE 140*   < > 126* 223* 187*  BUN 32*   < > 34* 42* 31*  CREATININE 1.75*   < > 2.19* 2.27* 1.96*  CALCIUM 8.5*   < > 9.5 9.1 8.2*  GFRNONAA 45*   < > 34* 33* 39*  PROT 6.4*   < > 7.8 7.5 7.1  ALBUMIN 2.9*   < > 3.8 3.5 3.4*  AST 36   < > 29 35 35  ALT 23   < > 19 21 23   ALKPHOS 86   < > 110 99 103  BILITOT 1.0   < > 0.9 0.7 0.9  BILIDIR 0.2  --   --   --   --   IBILI 0.8  --   --   --   --    < > = values in this interval not displayed.    Iron/TIBC/Ferritin/ %Sat    Component Value Date/Time   IRON 48 11/29/2021 0758   IRON 105 10/15/2018 1325   TIBC 298 11/29/2021 0758   TIBC 244 (L) 10/15/2018 1325   FERRITIN 117 11/29/2021 0758   FERRITIN 588 (H) 10/15/2018 1325   IRONPCTSAT 16 (L) 11/29/2021 0758   IRONPCTSAT 43 10/15/2018 1325      RADIOGRAPHIC STUDIES: I have personally reviewed the radiological images as listed and agreed with the findings in the report. NM Bone Scan Whole Body  Result Date: 12/08/2021 CLINICAL DATA:  History of renal cell carcinoma with known osseous metastasis. EXAM: NUCLEAR MEDICINE WHOLE BODY BONE SCAN TECHNIQUE: Whole body anterior and posterior images were obtained approximately 3 hours after intravenous injection of radiopharmaceutical. RADIOPHARMACEUTICALS:  21.17 mCi Technetium-10mMDP IV COMPARISON:  CT chest abdomen pelvis Dec 06, 2021 FINDINGS: Abnormal increased radiotracer uptake is identified at T9 and probably T10 vertebral bodies suspicious for bony metastasis. Mild degenerative uptake of bilateral shoulders,  elbows are noted. 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 The CT noted inferior endplate small L3 lesion does not have definite increased radiotracer uptake. Electronically Signed   By: WAbelardo DieselM.D.   On: 12/08/2021 11:57   CT CHEST ABDOMEN PELVIS WO CONTRAST  Result Date: 12/07/2021 CLINICAL DATA:  Metastatic renal cell carcinoma restaging * Tracking Code: BO * EXAM: CT CHEST, ABDOMEN AND PELVIS WITHOUT CONTRAST TECHNIQUE: Multidetector CT imaging of the chest, abdomen and pelvis was performed following the standard protocol without IV contrast. RADIATION DOSE REDUCTION: This exam was performed according to the departmental dose-optimization program which includes automated exposure control, adjustment of the  mA and/or kV according to patient size and/or use of iterative reconstruction technique. COMPARISON:  09/14/2021 FINDINGS: CT CHEST FINDINGS Cardiovascular: Right chest port catheter. Aortic atherosclerosis. Normal heart size. Three-vessel coronary artery calcifications. No pericardial effusion. Mediastinum/Nodes: No enlarged mediastinal, hilar, or axillary lymph nodes. Thyroid gland, trachea, and esophagus demonstrate no significant findings. Lungs/Pleura: Unchanged 0.7 cm nodule of the posterior lingula (series 4, image 82). There is new irregular ground-glass and bandlike consolidation in the vicinity of this nodule (series 4, image 87). Unchanged 0.8 cm nodule of the left lower lobe posterior to the left hilum (series 4, image 70). Slight interval enlargement of a 0.6 cm nodule of the peripheral right lower lobe, previously 0.4 cm (series 4, image 104). Interval enlargement of a nodule anteriorly in the right lower lobe measuring 0.7 cm, previously 0.3 cm (series 4, image 105). No pleural effusion or pneumothorax. Musculoskeletal: No chest wall mass. Unchanged lytic osseous lesion of the T9 vertebral body with posterior laminectomy of T9 and bridging fusion of T7 through T11 (series 6,  image 116). CT ABDOMEN PELVIS FINDINGS Hepatobiliary: Coarse, nodular cirrhotic morphology of the liver. No obvious solid liver abnormality is seen. No gallstones, gallbladder wall thickening, or biliary dilatation. Pancreas: Unremarkable. No pancreatic ductal dilatation or surrounding inflammatory changes. Spleen: Splenomegaly, maximum coronal span 20.9 cm. Adrenals/Urinary Tract: Adrenal glands are unremarkable. Status post left nephrectomy. Normal noncontrast appearance of the right kidney, without renal calculi, solid lesion, or hydronephrosis. Bladder is unremarkable. Stomach/Bowel: Stomach is within normal limits. Appendix is not clearly visualized. No evidence of bowel wall thickening, distention, or inflammatory changes. Vascular/Lymphatic: Aortic atherosclerosis. Unchanged prominent left retroperitoneal lymph nodes measuring up to 1.1 x 0.8 cm (series 2, image 74). Adjacent retroperitoneal fat stranding. Reproductive: No mass or other abnormality. Penile implant with left lower quadrant reservoir. Other: No abdominal wall hernia or abnormality. Small volume ascites throughout the abdomen and pelvis, slightly increased compared to prior examination. Musculoskeletal: No acute osseous findings. Unchanged tiny lytic lesion of the inferior endplate of L3, again suspicious for a second small osseous metastasis (series 6, image 117). IMPRESSION: 1. Interval enlargement small right lower lobe nodules, consistent with enlargement pulmonary metastases. Left lower lobe and lingular nodules are unchanged, with new ground-glass adjacent to the lingular nodule suggesting developing radiation fibrosis. 2. Status post left nephrectomy. 3. Unchanged lytic osseous lesion of the T9 vertebral body with posterior laminectomy of T9 and bridging fusion of T7 through T11. 4. Unchanged tiny lytic lesion of the inferior endplate of L3, again suspicious for a second small osseous metastasis. 5. Unchanged prominent left  retroperitoneal lymph nodes and adjacent fat stranding, nonspecific in the setting of cirrhosis. 6. Morphologic stigmata of cirrhosis and portal hypertension with gross splenomegaly. Small volume ascites throughout the abdomen pelvis, slightly increased compared to prior examination. 7. Coronary artery disease. Aortic Atherosclerosis (ICD10-I70.0). Electronically Signed   By: Delanna Ahmadi M.D.   On: 12/07/2021 13:41

## 2022-01-10 NOTE — Assessment & Plan Note (Deleted)
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   Count is stable.  Monitor 

## 2022-01-10 NOTE — Assessment & Plan Note (Signed)
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   Count is stable.  Monitor 

## 2022-01-12 ENCOUNTER — Other Ambulatory Visit: Payer: Self-pay | Admitting: *Deleted

## 2022-01-12 ENCOUNTER — Encounter: Payer: Self-pay | Admitting: Radiation Oncology

## 2022-01-12 ENCOUNTER — Ambulatory Visit
Admission: RE | Admit: 2022-01-12 | Discharge: 2022-01-12 | Disposition: A | Discharge status: Home / Self Care | Payer: BC Managed Care – PPO | Source: Ambulatory Visit | Attending: Radiation Oncology | Admitting: Radiation Oncology

## 2022-01-12 VITALS — BP 133/69 | HR 84 | Temp 96.4°F | Resp 16 | Wt 224.5 lb

## 2022-01-12 DIAGNOSIS — R911 Solitary pulmonary nodule: Secondary | ICD-10-CM

## 2022-01-12 NOTE — Progress Notes (Signed)
Radiation Oncology Follow up Note  Name: Henry Moore   Date:   01/12/2022 MRN:  [id number] DOB: 01/05/1961    This 61 y.o. male presents to the clinic today for 39-monthfollow-up status post SBRT to left upper lobe nodule in patient with known stage IV renal cell carcinoma previously treated to his thoracic spine.  REFERRING PROVIDER: TAlbina Billet MD  HPI: Patient is a 61year old male now out 5 months having completed SBRT to a left upper lobe lung nodule in patient with known stage IV renal cell carcinoma.  He continues to have back pain has had instrumentation as well as radiation therapy to T9 in the past..  His most recent CT scan showed interval enlargement of small right lower lobe pulmonary nodules consistent with enlarging pulmonary metastasis.  He also has unchanged lytic osseous lesions at T9 vertebral body with laminectomy at T9.  He also has a small lytic lesion at the endplate of L3 suspicious for a second small osseous mets.  Patient is currently onnivolumab.  Which he is tolerating well.  COMPLICATIONS OF TREATMENT: none  FOLLOW UP COMPLIANCE: keeps appointments   PHYSICAL EXAM:  BP 133/69 (BP Location: Left Arm, Patient Position: Sitting)   Pulse 84   Temp (!) 96.4 F (35.8 C) (Tympanic)   Resp 16   Wt 224 lb 8 oz (101.8 kg)   SpO2 99%   BMI 29.62 kg/m  Motor or sensory and DTR levels in the lower extremities are equal symmetric.  Proprioception is intact.  Well-developed well-nourished patient in NAD. HEENT reveals PERLA, EOMI, discs not visualized.  Oral cavity is clear. No oral mucosal lesions are identified. Neck is clear without evidence of cervical or supraclavicular adenopathy. Lungs are clear to A&P. Cardiac examination is essentially unremarkable with regular rate and rhythm without murmur rub or thrill. Abdomen is benign with no organomegaly or masses noted. Motor sensory and DTR levels are equal and symmetric in the upper and lower extremities. Cranial nerves  II through XII are grossly intact. Proprioception is intact. No peripheral adenopathy or edema is identified. No motor or sensory levels are noted. Crude visual fields are within normal range.  RADIOLOGY RESULTS: CT scans and bone scans are all reviewed compatible with above-stated findings  PLAN: Present time patient is currently under immunotherapy under medical oncology's direction.  I see no rationale for any further palliative treatment with radiation at this time.  I would be happy to reevaluate him in the future should that be indicated.  I have asked to see him back in 6 months for follow-up.  I would like to take this opportunity to thank you for allowing me to participate in the care of your patient..Noreene Filbert MD

## 2022-01-24 ENCOUNTER — Inpatient Hospital Stay: Payer: BC Managed Care – PPO

## 2022-01-24 ENCOUNTER — Inpatient Hospital Stay (HOSPITAL_BASED_OUTPATIENT_CLINIC_OR_DEPARTMENT_OTHER): Payer: BC Managed Care – PPO | Admitting: Oncology

## 2022-01-24 ENCOUNTER — Encounter: Payer: Self-pay | Admitting: Oncology

## 2022-01-24 DIAGNOSIS — N1832 Chronic kidney disease, stage 3b: Secondary | ICD-10-CM

## 2022-01-24 DIAGNOSIS — K7460 Unspecified cirrhosis of liver: Secondary | ICD-10-CM | POA: Diagnosis not present

## 2022-01-24 DIAGNOSIS — D696 Thrombocytopenia, unspecified: Secondary | ICD-10-CM

## 2022-01-24 DIAGNOSIS — C642 Malignant neoplasm of left kidney, except renal pelvis: Secondary | ICD-10-CM

## 2022-01-24 DIAGNOSIS — C649 Malignant neoplasm of unspecified kidney, except renal pelvis: Secondary | ICD-10-CM

## 2022-01-24 DIAGNOSIS — R188 Other ascites: Secondary | ICD-10-CM

## 2022-01-24 DIAGNOSIS — C7951 Secondary malignant neoplasm of bone: Secondary | ICD-10-CM | POA: Diagnosis not present

## 2022-01-24 LAB — CBC WITH DIFFERENTIAL/PLATELET
Abs Immature Granulocytes: 0.04 10*3/uL (ref 0.00–0.07)
Basophils Absolute: 0 10*3/uL (ref 0.0–0.1)
Basophils Relative: 1 %
Eosinophils Absolute: 0.2 10*3/uL (ref 0.0–0.5)
Eosinophils Relative: 4 %
HCT: 31.1 % — ABNORMAL LOW (ref 39.0–52.0)
Hemoglobin: 10.8 g/dL — ABNORMAL LOW (ref 13.0–17.0)
Immature Granulocytes: 1 %
Lymphocytes Relative: 8 %
Lymphs Abs: 0.3 10*3/uL — ABNORMAL LOW (ref 0.7–4.0)
MCH: 32.3 pg (ref 26.0–34.0)
MCHC: 34.7 g/dL (ref 30.0–36.0)
MCV: 93.1 fL (ref 80.0–100.0)
Monocytes Absolute: 0.4 10*3/uL (ref 0.1–1.0)
Monocytes Relative: 11 %
Neutro Abs: 2.9 10*3/uL (ref 1.7–7.7)
Neutrophils Relative %: 75 %
Platelets: 73 10*3/uL — ABNORMAL LOW (ref 150–400)
RBC: 3.34 MIL/uL — ABNORMAL LOW (ref 4.22–5.81)
RDW: 13.5 % (ref 11.5–15.5)
WBC: 3.9 10*3/uL — ABNORMAL LOW (ref 4.0–10.5)
nRBC: 0 % (ref 0.0–0.2)

## 2022-01-24 LAB — COMPREHENSIVE METABOLIC PANEL
ALT: 18 U/L (ref 0–44)
AST: 30 U/L (ref 15–41)
Albumin: 3.9 g/dL (ref 3.5–5.0)
Alkaline Phosphatase: 98 U/L (ref 38–126)
Anion gap: 10 (ref 5–15)
BUN: 42 mg/dL — ABNORMAL HIGH (ref 6–20)
CO2: 24 mmol/L (ref 22–32)
Calcium: 10.1 mg/dL (ref 8.9–10.3)
Chloride: 97 mmol/L — ABNORMAL LOW (ref 98–111)
Creatinine, Ser: 2.24 mg/dL — ABNORMAL HIGH (ref 0.61–1.24)
GFR, Estimated: 33 mL/min — ABNORMAL LOW (ref >60)
Glucose, Bld: 171 mg/dL — ABNORMAL HIGH (ref 70–99)
Potassium: 4.2 mmol/L (ref 3.5–5.1)
Sodium: 131 mmol/L — ABNORMAL LOW (ref 135–145)
Total Bilirubin: 1.1 mg/dL (ref 0.3–1.2)
Total Protein: 7.9 g/dL (ref 6.5–8.1)

## 2022-01-24 MED ORDER — SODIUM CHLORIDE 0.9 % IV SOLN
240.0000 mg | Freq: Once | INTRAVENOUS | Status: AC
  Administered 2022-01-24: 240 mg via INTRAVENOUS
  Filled 2022-01-24: qty 24

## 2022-01-24 MED ORDER — HEPARIN SOD (PORK) LOCK FLUSH 100 UNIT/ML IV SOLN
500.0000 [IU] | Freq: Once | INTRAVENOUS | Status: AC | PRN
  Filled 2022-01-24: qty 5

## 2022-01-24 MED ORDER — DENOSUMAB 120 MG/1.7ML ~~LOC~~ SOLN
120.0000 mg | Freq: Once | SUBCUTANEOUS | Status: AC
  Administered 2022-01-24: 120 mg via SUBCUTANEOUS
  Filled 2022-01-24: qty 1.7

## 2022-01-24 MED ORDER — SODIUM CHLORIDE 0.9 % IV SOLN
Freq: Once | INTRAVENOUS | Status: AC
  Filled 2022-01-24: qty 250

## 2022-01-24 MED ORDER — HEPARIN SOD (PORK) LOCK FLUSH 100 UNIT/ML IV SOLN
INTRAVENOUS | Status: AC
  Administered 2022-01-24: 500 [IU]
  Filled 2022-01-24: qty 5

## 2022-01-25 ENCOUNTER — Encounter: Payer: Self-pay | Admitting: Oncology

## 2022-01-26 ENCOUNTER — Encounter: Payer: Self-pay | Admitting: Oncology

## 2022-01-26 NOTE — Progress Notes (Signed)
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 Labs reviewed and discussed with patient Proceed with nivolumab. he has follow-up appointment with Dr. Baruch Gouty in June.  Cirrhosis of liver with ascites ([place]) Continue follow-up with gastroenterology.  Continue Lasix and spironolactone.  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  Count is stable.  Monitor  Metastasis to bone (HCC) T9 lesion, s/p resection and spine fusion.  Pathology is positive for RCC. S/p palliative RT.  Continue calcium [1200 mg] and vitamin D supplementation. Xgeva monthly.    Patient knows to call our office if he experiences any additional diarrhea episodes. No orders of the defined types were placed in this encounter.  Follow up in 2 weeks.   All questions were answered. The patient knows to call the clinic with any problems, questions or concerns.  Earlie Server, MD, PhD [facility] Hematology Oncology 01/24/2022     CHIEF COMPLAINTS/REASON FOR VISIT:  Follow-up for kidney cancer treatments  HISTORY OF PRESENTING ILLNESS:   Henry Moore is a  61 y.o.  male with PMH listed below was seen in consultation at the request of  Albina Billet, MD  for follow up of  kidney cancer Oncology History  Renal cell carcinoma ([place])  10/16/2018 Initial Diagnosis   Renal cell carcinoma -10/16/2018 Left nephrectomy showed renal cell carcinoma, clear-cell type, nuclear grade 4, tumor extends into the renal vein and renal sinus fat.  Urethral, vascular and or resection margins are negative for tumor pT3a Nx Mx NGS -foundation one PD-L1 TPS 0% no reportable mutation, MS stable. TMB 8  muts/mb VHL D157f*16, SETD2 BAP   07/14/2019 Cancer Staging   Staging form: Kidney, AJCC 8th Edition - Clinical stage from 07/14/2019: Stage IV (cTX, cNX, cM1) - Signed by YEarlie Server MD on 08/05/2020   07/14/2019 Progression   01/28/2019 patient had another CT chest abdomen pelvis done with contrast Showed interval left nephrectomy.  Scattered tiny pulmonary nodules bilaterally.  Nonspecific.  No other evidence of metastatic disease.  Cirrhosis changes extensive coronary and aortic atherosclerosis 07/14/2019 patient underwent surveillance CT chest abdomen pelvis without contrast Interval progression of pulmonary metastasis with new enlarged right paratracheal lymph node 1.7 cm, previously 0.6 cm one-point since worrisome for metastatic adenopathy.  Multiple progressive pulmonary nodules, index nodule within the lingula measures 1.3 cm, previously 3 mm. Index subpleural nodule within the anterior right upper lobe measuring 1.8 cm, previously 3 cm Index nodule within the post lateral right lower lobe measuring 5 mm, new from previous care Aortic sclerosis.  Three-vessel coronary artery calcification noted.  Cirrhosis changes.  Splenomegaly.     08/14/2019 -  Chemotherapy   #08/14/2019-10/16/2019 nivolumab and ipilimumab Q21D #11/06/2019-nivolumab every 2 weeks maintenance.     10/05/2019 Genetic Testing   Negative genetic testing. No pathogenic variants identified on the [employer] Multi-Cancer Panel. VUS in [place]  c.4922T>C (p.Leu1641Pro) identified. The report date is 10/05/2019.  The Multi-Cancer Panel offered by [facility] includes sequencing and/or deletion duplication testing of the following 85 genes: AIP, ALK, APC, ATM, AXIN2,BAP1,  BARD1, BLM, BMPR1A, BRCA1, BRCA2, BRIP1, CASR, CDC73, CDH1, CDK4, CDKN1B, CDKN1C, CDKN2A (p14ARF), CDKN2A (p16INK4a), CEBPA, CHEK2, CTNNA1, DICER1, DIS3L2, EGFR (c.2369C>T, p.Thr790Met variant only),  EPCAM (Deletion/duplication testing only), FH, FLCN, GATA2, GPC3,  GREM1 (Promoter region deletion/duplication testing only), HOXB13 (c.251G>A, p.Gly84Glu), HRAS, KIT, MAX, MEN1, MET, MITF (c.952G>A, p.Glu318Lys variant only), MLH1, MSH2, MSH3, MSH6, MUTYH, NBN, NF1, NF2, NTHL1, PALB2, PDGFRA, PHOX2B, PMS2, POLD1, POLE, POT1, PRKAR1A, PTCH1, PTEN, RAD50, RAD51C, RAD51D, RB1, RECQL4, RET, RNF43, RUNX1, SDHAF2, SDHA (sequence changes only), SDHB, SDHC, SDHD, SMAD4, SMARCA4, SMARCB1, SMARCE1, STK11, SUFU, TERC, TERT, TMEM127, TP53, TSC1, TSC2, VHL, WRN and WT1.    10/12/2020 Imaging   10/12/2020 CT chest abdomen pelvis with out contrast. New 15 mm lytic lesion in the right T9 vertebral body.  Suspicious for isolated lytic metastasis.  No findings of suspicious soft tissue metastasis in the chest abdomen or pelvis.  Residual postinfectious inflammatory scarring in the lung bilaterally.  Cirrhosis.  Splenomegaly.  Minimal ascites.   # 10/26/20 Bone scan showed Minimal increased activity noted at the right costovertebral junction at the T9 level is most likely degenerative. Similar finding noted on prior bone scan of 08/11/2019. No definite T9 vertebral body abnormal activity corresponding to lytic lesion noted on recent CT. Again the recently identified T9 vertebral body lytic lesion is suspicious and further evaluation of the thoracic spine with MRI can be obtained. No other bony abnormalities noted by bone scan. I discussed patient's case on tumor board 11/04/2020.  Bone scan probably is not sensitive in detecting lytic lesion from [place].  Consensus reached upon clinically based on the CT scan, new bone lesion is highly likely metastasis from [place].  Options including proceed with bone lesion biopsy versus referral to radiation oncology for empiric palliative radiation.  Patient has other comorbidities including cirrhosis/pancytopenia, patient opts to proceed with empiric palliative radiation.   12/13/2020 -  Radiation Therapy   finished RT to T9   12/2020 [facility] Admission    June 2022 abscess of anal area , s/ drainage and antibiotics. July 2022, immunotherapy was held due to diarrhea.     03/18/2021 -  Chemotherapy   03/18/2021, resumed on nivolumab treatment x1. 04/01/2021, hold nivolumab due to diarrhea and upcoming surgery. # 04/08/2021 paracentesis. Cytology negative for malignancy.  # 04/14/2021- 04/16/2021 admitted to [facility] due to abdominal distension. S/p paracentesis on 04/15/2021 , cytology negative.  # 04/22/21 right T9 transpedicular mass resection. T7-11 posterior spinal fusion by Dr. Cari Caraway at [facility] # 05/06/2021 abdominal distention. S/p repeat therapeutic paracentesis.    05/30/2021 Imaging    CT chest abdomen pelvis showed 8 mm left upper lobe pulmonary nodule, increased size compared to 2 mm previously.  Interval progression highly concerning for metastatic disease.  Primary bronchogenic neoplasm is also another consideration. Interval thoracic fusion above and below the T10.  Previously described tiny lucency inferior L3 vertebral body is stable.  Cirrhosis with splenomegaly and small volume ascites.  Cholelithiasis.  Aortic atherosclerosis   08/12/2021 -  Radiation Therapy   status post stereotactic radiation to lung   09/14/2021 Imaging   CT chest abdomen pelvis without contrast showed 0.8 cm subpleural nodule of the left lower lobe, previously 0.5 cm.  Other small nodules are unchanged.  Unchanged lytic lesion of T9 with bridging fusion of the lower thoracic spine.  Unchanged tiny lucent lesion of the inferior endplate of L3.  Nonspecific. Unchanged prominent retroperitoneal lymph node and adjacent fat stranding.  Attention on follow-up.  No noncontrast evidence of new soft tissue metastasis in the abdomen or pelvis.  Status post left nephrectomy.  Cirrhosis and portal hypertension with trace perihepatic ascites.  Coronary  artery disease   12/06/2021 Imaging   CT showed interval enlargement of the small right lower lobe pulm nodules, 0.7 cm and 0.6 cm.   Other lung nodules are unchanged.  New groundglass adjacent to the lingular nodule suggesting radiation fibrosis. Unchanged lytic osseous lesion of T9 with posterior laminectomy of T9 and bridging fusion of T7-T11.  Unchanged tiny lytic lesion in the inferior endplate of L3.  Unchanged prominent left retroperitoneal lymph nodes and adjacent fat stranding.  Cirrhosis and portal hypertension with gross splenomegaly.  Small volume of ascites.  Slightly increased.  Coronary artery disease 12/08/2021, bone scan showed Bone metastasis at T9, probably T10. The CT noted inferior endplate small L3 lesion does not have definite increased radiotracer uptake.   Given that both lung nodules are still less than 1 cm, would not be able to be biopsied or radiated.  Recommend observation..  Attention future studies.   Metastasis to bone ([place])  01/10/2022 Initial Diagnosis   Metastasis to bone [facility])        INTERVAL HISTORY AASHISH HAMM is a 61 y.o. male who has above history reviewed by me today presents for follow up visit for management of stage IV RCC Reports feeling well at baseline.  He has lost 4 pounds since last visit  Chronic back pain unchanged. He had one episode of loose bowel movement after treatment which spontaneously resolved.  No nausea vomiting, abdominal pain.   Review of Systems  Constitutional:  Positive for fatigue. Negative for appetite change, chills, fever and unexpected weight change.  HENT:   Negative for hearing loss and voice change.   Eyes:  Negative for eye problems and icterus.  Respiratory:  Negative for chest tightness, cough and shortness of breath.   Cardiovascular:  Negative for chest pain and leg swelling.  Gastrointestinal:  Negative for abdominal distention, abdominal pain, diarrhea, nausea and vomiting.  Endocrine: Negative for hot flashes.  Genitourinary:  Negative for difficulty urinating, dysuria and frequency.   Musculoskeletal:  Positive for arthralgias.        Chronic back pain  Skin:  Negative for itching and rash.  Neurological:  Negative for light-headedness and numbness.  Hematological:  Negative for adenopathy. Does not bruise/bleed easily.  Psychiatric/Behavioral:  Negative for confusion.     MEDICAL HISTORY:  Past Medical History:  Diagnosis Date   Cough    SINUS DRAINAGE CAUSING COUGHING , REPORTS CLEAR MUCOUS    Diabetes mellitus without complication (HCC)    Family history of breast cancer    Family history of colon cancer    Family history of stomach cancer    HTN (hypertension)    Hyperlipemia    Left renal mass    Platelets decreased ([place])    DENIES UNSUAL BLEEDING    PONV (postoperative nausea and vomiting)    Renal cell carcinoma ([place]) 08/04/2019   WBC decreased     SURGICAL HISTORY: Past Surgical History:  Procedure Laterality Date   ANKLE SURGERY Left    ANTERIOR CERVICAL DECOMP/DISCECTOMY FUSION N/A 04/16/2014   Procedure: ANTERIOR CERVICAL DECOMPRESSION/DISCECTOMY FUSION  (ACDF C5-C7)   (2 LEVELS)     ;  Surgeon: Melina Schools, MD;  Location: [facility];  Service: Orthopedics;  Laterality: N/A;   APPENDECTOMY     BACK SURGERY     ESOPHAGOGASTRODUODENOSCOPY (EGD) WITH PROPOFOL N/A 02/06/2019   Procedure: ESOPHAGOGASTRODUODENOSCOPY (EGD) WITH PROPOFOL;  Surgeon: [name], MD;  Location: [facility] ENDOSCOPY;  Service: Gastroenterology;  Laterality: N/A;   ESOPHAGOGASTRODUODENOSCOPY (EGD)  WITH PROPOFOL N/A 03/04/2019   Procedure: ESOPHAGOGASTRODUODENOSCOPY (EGD) WITH PROPOFOL;  Surgeon: Lin Landsman, MD;  Location[facility] ENDOSCOPY;  Service: Gastroenterology;  Laterality: N/A;   ESOPHAGOGASTRODUODENOSCOPY (EGD) WITH PROPOFOL N/A 04/22/2019   Procedure: ESOPHAGOGASTRODUODENOSCOPY (EGD) WITH PROPOFOL;  Surgeon: [name], MD;  Location: [facility] ENDOSCOPY;  Service: Gastroenterology;  Laterality: N/A;   FRACTURE SURGERY     HYDROCELE EXCISION     KNEE ARTHROSCOPY Bilateral    LAPAROSCOPIC NEPHRECTOMY, HAND ASSISTED Left  10/16/2018   Procedure: LEFT HAND ASSISTED LAPAROSCOPIC NEPHRECTOMY;  Surgeon: Lucas Mallow, MD;  Location: [facility] ORS;  Service: Urology;  Laterality: Left;   NASAL SINUS SURGERY     X 2   PENILE PROSTHESIS IMPLANT  10 YEARS AGO   PORTA CATH INSERTION N/A 11/11/2019   Procedure: PORTA CATH INSERTION;  Surgeon: Katha Cabal, MD;  Location: [facility];  Service: Cardiovascular;  Laterality: N/A;   SHOULDER SURGERY Left     SOCIAL HISTORY: Social History   Socioeconomic History   Marital status: Married    Spouse name: Not on file   Number of children: Not on file   Years of education: Not on file   Highest education level: Not on file  Occupational History   Not on file  Tobacco Use   Smoking status: Former    Packs/day: 0.25    Years: 20.00    Total pack years: 5.00    Types: Cigarettes    Quit date: 2015    Years since quitting: 8.4   Smokeless tobacco: Never  Vaping Use   Vaping Use: Never used  Substance and Sexual Activity   Alcohol use: Not Currently    Alcohol/week: 0.0 standard drinks of alcohol    Comment: no alchol in 1 year   Drug use: No   Sexual activity: Not on file  Other Topics Concern   Not on file  Social History Narrative   Not on file   Social Determinants of Health   Financial Resource Strain: Not on file  Food Insecurity: Not on file  Transportation Needs: Not on file  Physical Activity: Not on file  Stress: Not on file  Social Connections: Not on file  Intimate Partner Violence: Not on file    FAMILY HISTORY: Family History  Problem Relation Age of Onset   Diabetes Mother    Cancer Mother        neuroendocrine cancer   Cancer Father        neuroendocrine cancer of meninges   Cancer Maternal Grandmother        tomach dx 17   Alzheimer's disease Paternal Grandmother    Lung cancer Paternal Grandfather    Lung cancer Maternal Aunt    Colon cancer Maternal Uncle    Breast cancer Paternal Aunt        both dx 79s    Ovarian cancer Other        dx 95   Breast cancer Cousin     ALLERGIES:  is allergic to codeine and mucinex [guaifenesin er].  MEDICATIONS:  Current Outpatient Medications  Medication Sig Dispense Refill   ACCU-CHEK AVIVA PLUS test strip CHECK BL00D SUGAR ONCE OR TWICE DAILY. 100 each PRN   calcium carbonate (OS-CAL - DOSED IN MG OF ELEMENTAL CALCIUM) 1250 (500 Ca) MG tablet Take 1 tablet by mouth.     Cholecalciferol (VITAMIN D3) 125 MCG (5000 UT) CAPS Take 5,000 Units by mouth daily.     furosemide (  LASIX) 40 MG tablet Take 1 tablet (40 mg total) by mouth daily. 30 tablet 0   glipiZIDE (GLUCOTROL) 5 MG tablet Take 5 mg by mouth daily. Tablet(s) By Mouth Daily     levothyroxine (SYNTHROID) 25 MCG tablet Take 1 tablet (25 mcg total) by mouth daily before breakfast. 30 tablet 3   metFORMIN (GLUCOPHAGE) 500 MG tablet Take 1,000 mg by mouth 2 (two) times daily. 2 Tablet(s) By Mouth Twice Daily     pravastatin (PRAVACHOL) 20 MG tablet Take 1 tablet (20 mg total) by mouth every evening. 30 tablet 11   rifaximin (XIFAXAN) 550 MG TABS tablet Take 1 tablet (550 mg total) by mouth 2 (two) times daily. 60 tablet 11   spironolactone (ALDACTONE) 100 MG tablet Take 1 tablet (100 mg total) by mouth daily. 30 tablet 0   vitamin B-12 (CYANOCOBALAMIN) 1000 MCG tablet Take 1 tablet (1,000 mcg total) by mouth daily. 90 tablet 0   carvedilol (COREG) 25 MG tablet Take 1 tablet (25 mg total) by mouth 2 (two) times daily with a meal. (Patient not taking: Reported on 10/18/2021) 180 tablet 3   fentaNYL (DURAGESIC) 12 MCG/HR Place 1 patch onto the skin every 3 (three) days. (Patient not taking: Reported on 09/19/2021) 5 patch 0   loperamide (IMODIUM A-D) 2 MG tablet Take 2 mg by mouth 4 (four) times daily as needed for diarrhea or loose stools. (Patient not taking: Reported on 11/01/2021)     prochlorperazine (COMPAZINE) 10 MG tablet Take 1 tablet (10 mg total) by mouth every 6 (six) hours as needed for nausea or  vomiting. (Patient not taking: Reported on 12/13/2021) 30 tablet 0   promethazine (PHENERGAN) 25 MG tablet Take 1 tablet (25 mg total) by mouth every 6 (six) hours as needed for nausea or vomiting. (Patient not taking: Reported on 11/01/2021) 30 tablet 0   Current Facility-Administered Medications  Medication Dose Route Frequency Provider Last Rate Last Admin   albumin human 25 % solution 25 g  25 g Intravenous Once [name], MD       Facility-Administered Medications Ordered in Other Visits  Medication Dose Route Frequency Provider Last Rate Last Admin   sodium chloride flush (NS) 0.9 % injection 10 mL  10 mL Intravenous PRN Earlie Server, MD   10 mL at 01/22/20 0831     PHYSICAL EXAMINATION: ECOG PERFORMANCE STATUS: 1 - Symptomatic but completely ambulatory Vitals:   01/24/22 1327  BP: (!) 156/82  Pulse: (!) 102  Temp: (!) 96 F (35.6 C)   Filed Weights   01/24/22 1327  Weight: 218 lb (98.9 kg)    Physical Exam Constitutional:      General: He is not in acute distress.    Appearance: He is obese.  HENT:     Head: Normocephalic and atraumatic.  Eyes:     General: No scleral icterus. Cardiovascular:     Rate and Rhythm: Normal rate and regular rhythm.     Heart sounds: Normal heart sounds.  Pulmonary:     Effort: Pulmonary effort is normal. No respiratory distress.     Breath sounds: No wheezing.  Abdominal:     General: Bowel sounds are normal. There is no distension.     Palpations: Abdomen is soft.  Musculoskeletal:        General: No deformity. Normal range of motion.     Cervical back: Normal range of motion and neck supple.  Skin:    General: Skin is warm and dry.  Findings: No erythema or rash.  Neurological:     Mental Status: He is alert and oriented to person, place, and time. Mental status is at baseline.     Cranial Nerves: No cranial nerve deficit.     Coordination: Coordination normal.  Psychiatric:        Mood and Affect: Mood normal.      LABORATORY DATA:  I have reviewed the data as listed Lab Results  Component Value Date   WBC 3.9 (L) 01/24/2022   HGB 10.8 (L) 01/24/2022   HCT 31.1 (L) 01/24/2022   MCV 93.1 01/24/2022   PLT 73 (L) 01/24/2022   Recent Labs    04/14/21 1656 04/15/21 1118 12/27/21 1422 01/10/22 1342 01/24/22 1308  NA 132*   < > 133* 129* 131*  K 5.1   < > 4.0 4.4 4.2  CL 105   < > 104 101 97*  CO2 21*   < > 23 20* 24  GLUCOSE 140*   < > 223* 187* 171*  BUN 32*   < > 42* 31* 42*  CREATININE 1.75*   < > 2.27* 1.96* 2.24*  CALCIUM 8.5*   < > 9.1 8.2* 10.1  GFRNONAA 45*   < > 33* 39* 33*  PROT 6.4*   < > 7.5 7.1 7.9  ALBUMIN 2.9*   < > 3.5 3.4* 3.9  AST 36   < > 35 35 30  ALT 23   < > _0 ALKPHOS 86   < > 99 103 98  BILITOT 1.0   < > 0.7 0.9 1.1  BILIDIR 0.2  --   --   --   --   IBILI 0.8  --   --   --   --    < > = values in this interval not displayed.    Iron/TIBC/Ferritin/ %Sat    Component Value Date/Time   IRON 48 11/29/2021 0758   IRON 105 10/15/2018 1325   TIBC 298 11/29/2021 0758   TIBC 244 (L) 10/15/2018 1325   FERRITIN 117 11/29/2021 0758   FERRITIN 588 (H) 10/15/2018 1325   IRONPCTSAT 16 (L) 11/29/2021 0758   IRONPCTSAT 43 10/15/2018 1325      RADIOGRAPHIC STUDIES: I have personally reviewed the radiological images as listed and agreed with the findings in the report. NM Bone Scan Whole Body  Result Date: 12/08/2021 CLINICAL DATA:  History of renal cell carcinoma with known osseous metastasis. EXAM: NUCLEAR MEDICINE WHOLE BODY BONE SCAN TECHNIQUE: Whole body anterior and posterior images were obtained approximately 3 hours after intravenous injection of radiopharmaceutical. RADIOPHARMACEUTICALS:  21.17 mCi Technetium-57mMDP IV COMPARISON:  CT chest abdomen pelvis Dec 06, 2021 FINDINGS: Abnormal increased radiotracer uptake is identified at T9 and probably T10 vertebral bodies suspicious for bony metastasis. Mild degenerative uptake of bilateral shoulders,  elbows are noted. 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 The CT noted inferior endplate small L3 lesion does not have definite increased radiotracer uptake. Electronically Signed   By: WAbelardo DieselM.D.   On: 12/08/2021 11:57   CT CHEST ABDOMEN PELVIS WO CONTRAST  Result Date: 12/07/2021 CLINICAL DATA:  Metastatic renal cell carcinoma restaging * Tracking Code: BO * EXAM: CT CHEST, ABDOMEN AND PELVIS WITHOUT CONTRAST TECHNIQUE: Multidetector CT imaging of the chest, abdomen and pelvis was performed following the standard protocol without IV contrast. RADIATION DOSE REDUCTION: This exam was performed according to the departmental dose-optimization program which includes automated exposure control, adjustment of  the mA and/or kV according to patient size and/or use of iterative reconstruction technique. COMPARISON:  09/14/2021 FINDINGS: CT CHEST FINDINGS Cardiovascular: Right chest port catheter. Aortic atherosclerosis. Normal heart size. Three-vessel coronary artery calcifications. No pericardial effusion. Mediastinum/Nodes: No enlarged mediastinal, hilar, or axillary lymph nodes. Thyroid gland, trachea, and esophagus demonstrate no significant findings. Lungs/Pleura: Unchanged 0.7 cm nodule of the posterior lingula (series 4, image 82). There is new irregular ground-glass and bandlike consolidation in the vicinity of this nodule (series 4, image 87). Unchanged 0.8 cm nodule of the left lower lobe posterior to the left hilum (series 4, image 70). Slight interval enlargement of a 0.6 cm nodule of the peripheral right lower lobe, previously 0.4 cm (series 4, image 104). Interval enlargement of a nodule anteriorly in the right lower lobe measuring 0.7 cm, previously 0.3 cm (series 4, image 105). No pleural effusion or pneumothorax. Musculoskeletal: No chest wall mass. Unchanged lytic osseous lesion of the T9 vertebral body with posterior laminectomy of T9 and bridging fusion of T7 through T11 (series 6,  image 116). CT ABDOMEN PELVIS FINDINGS Hepatobiliary: Coarse, nodular cirrhotic morphology of the liver. No obvious solid liver abnormality is seen. No gallstones, gallbladder wall thickening, or biliary dilatation. Pancreas: Unremarkable. No pancreatic ductal dilatation or surrounding inflammatory changes. Spleen: Splenomegaly, maximum coronal span 20.9 cm. Adrenals/Urinary Tract: Adrenal glands are unremarkable. Status post left nephrectomy. Normal noncontrast appearance of the right kidney, without renal calculi, solid lesion, or hydronephrosis. Bladder is unremarkable. Stomach/Bowel: Stomach is within normal limits. Appendix is not clearly visualized. No evidence of bowel wall thickening, distention, or inflammatory changes. Vascular/Lymphatic: Aortic atherosclerosis. Unchanged prominent left retroperitoneal lymph nodes measuring up to 1.1 x 0.8 cm (series 2, image 74). Adjacent retroperitoneal fat stranding. Reproductive: No mass or other abnormality. Penile implant with left lower quadrant reservoir. Other: No abdominal wall hernia or abnormality. Small volume ascites throughout the abdomen and pelvis, slightly increased compared to prior examination. Musculoskeletal: No acute osseous findings. Unchanged tiny lytic lesion of the inferior endplate of L3, again suspicious for a second small osseous metastasis (series 6, image 117). IMPRESSION: 1. Interval enlargement small right lower lobe nodules, consistent with enlargement pulmonary metastases. Left lower lobe and lingular nodules are unchanged, with new ground-glass adjacent to the lingular nodule suggesting developing radiation fibrosis. 2. Status post left nephrectomy. 3. Unchanged lytic osseous lesion of the T9 vertebral body with posterior laminectomy of T9 and bridging fusion of T7 through T11. 4. Unchanged tiny lytic lesion of the inferior endplate of L3, again suspicious for a second small osseous metastasis. 5. Unchanged prominent left  retroperitoneal lymph nodes and adjacent fat stranding, nonspecific in the setting of cirrhosis. 6. Morphologic stigmata of cirrhosis and portal hypertension with gross splenomegaly. Small volume ascites throughout the abdomen pelvis, slightly increased compared to prior examination. 7. Coronary artery disease. Aortic Atherosclerosis (ICD10-I70.0). Electronically Signed   By: Delanna Ahmadi M.D.   On: 12/07/2021 13:41

## 2022-01-26 NOTE — Assessment & Plan Note (Signed)
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 Labs reviewed and discussed with patient Proceed with nivolumab. he has follow-up appointment with Dr. Baruch Gouty in June.

## 2022-01-26 NOTE — Assessment & Plan Note (Addendum)
T9 lesion, s/p resection and spine fusion.  Pathology is positive for RCC. S/p palliative RT.  Continue calcium [1200 mg] and vitamin D supplementation. Xgeva monthly.

## 2022-01-26 NOTE — Assessment & Plan Note (Signed)
Continue follow-up with gastroenterology.  Continue Lasix and spironolactone.

## 2022-01-26 NOTE — Assessment & Plan Note (Signed)
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  Count is stable.  Monitor

## 2022-02-02 ENCOUNTER — Inpatient Hospital Stay: Payer: Medicare Other | Attending: Hospice and Palliative Medicine | Admitting: Hospice and Palliative Medicine

## 2022-02-02 ENCOUNTER — Other Ambulatory Visit: Payer: Self-pay

## 2022-02-02 DIAGNOSIS — Z87891 Personal history of nicotine dependence: Secondary | ICD-10-CM | POA: Insufficient documentation

## 2022-02-02 DIAGNOSIS — Z923 Personal history of irradiation: Secondary | ICD-10-CM | POA: Insufficient documentation

## 2022-02-02 DIAGNOSIS — N1832 Chronic kidney disease, stage 3b: Secondary | ICD-10-CM | POA: Insufficient documentation

## 2022-02-02 DIAGNOSIS — C7951 Secondary malignant neoplasm of bone: Secondary | ICD-10-CM | POA: Insufficient documentation

## 2022-02-02 DIAGNOSIS — C7800 Secondary malignant neoplasm of unspecified lung: Secondary | ICD-10-CM | POA: Insufficient documentation

## 2022-02-02 DIAGNOSIS — Z803 Family history of malignant neoplasm of breast: Secondary | ICD-10-CM | POA: Insufficient documentation

## 2022-02-02 DIAGNOSIS — D6959 Other secondary thrombocytopenia: Secondary | ICD-10-CM | POA: Insufficient documentation

## 2022-02-02 DIAGNOSIS — G893 Neoplasm related pain (acute) (chronic): Secondary | ICD-10-CM | POA: Insufficient documentation

## 2022-02-02 DIAGNOSIS — Z7984 Long term (current) use of oral hypoglycemic drugs: Secondary | ICD-10-CM | POA: Insufficient documentation

## 2022-02-02 DIAGNOSIS — D631 Anemia in chronic kidney disease: Secondary | ICD-10-CM | POA: Insufficient documentation

## 2022-02-02 DIAGNOSIS — C642 Malignant neoplasm of left kidney, except renal pelvis: Secondary | ICD-10-CM | POA: Diagnosis not present

## 2022-02-02 DIAGNOSIS — R188 Other ascites: Secondary | ICD-10-CM | POA: Insufficient documentation

## 2022-02-02 DIAGNOSIS — I129 Hypertensive chronic kidney disease with stage 1 through stage 4 chronic kidney disease, or unspecified chronic kidney disease: Secondary | ICD-10-CM | POA: Insufficient documentation

## 2022-02-02 DIAGNOSIS — Z801 Family history of malignant neoplasm of trachea, bronchus and lung: Secondary | ICD-10-CM | POA: Insufficient documentation

## 2022-02-02 DIAGNOSIS — K7460 Unspecified cirrhosis of liver: Secondary | ICD-10-CM | POA: Insufficient documentation

## 2022-02-02 DIAGNOSIS — R197 Diarrhea, unspecified: Secondary | ICD-10-CM | POA: Insufficient documentation

## 2022-02-02 DIAGNOSIS — Z79899 Other long term (current) drug therapy: Secondary | ICD-10-CM | POA: Insufficient documentation

## 2022-02-02 DIAGNOSIS — Z800 Family history of malignant neoplasm of digestive organs: Secondary | ICD-10-CM | POA: Insufficient documentation

## 2022-02-02 DIAGNOSIS — Z5112 Encounter for antineoplastic immunotherapy: Secondary | ICD-10-CM | POA: Insufficient documentation

## 2022-02-02 DIAGNOSIS — Z905 Acquired absence of kidney: Secondary | ICD-10-CM | POA: Insufficient documentation

## 2022-02-02 NOTE — Progress Notes (Addendum)
Virtual Visit via telephone note  I connected with [name] on 02/02/22 at  1:30 PM EDT by telephone and verified that I am speaking with the correct person using two identifiers.   I discussed the limitations, risks, security and privacy concerns of performing an evaluation and management service by telephone and the availability of in person appointments. I also discussed with the patient that there may be a patient responsible charge related to this service. The patient expressed understanding and agreed to proceed.  Patient location: Home Provider location: Clinic  History of Present Illness: Henry Moore is a 61 y.o. 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  patient was referred to palliative care to help address goals and manage ongoing symptoms.   Observations/Objective: I called and spoke with patient by phone.   Patient says he is doing well.  He continues to have chronic pain but denies any significant change in that area.  He has occasional nausea but denies vomiting.  He has not had to take antiemetics.  No significant change or concerns regarding symptoms today.  He reports stable appetite and performance status.  Assessment and Plan: RCC -on immunotherapy.  Seems to be doing well symptomatically.  Will follow  Neoplasm related pain -stable  Follow Up Instructions: Follow-up telephone visit 3 to 4 months   I discussed the assessment and treatment plan with the patient. The patient was provided an opportunity to ask questions and all were answered. The patient agreed with the plan and demonstrated an understanding of the instructions.   The patient was advised to call back or seek an in-person evaluation if the symptoms worsen or if the condition fails to improve as anticipated.  I provided 10 minutes of non-face-to-face time during this  encounter.   Irean Hong, NP

## 2022-02-02 NOTE — Progress Notes (Signed)
Contacted patient prior to telephone call apt with Josh. Patient reports intermittent nausea. Patient denies any episode of vomiting. Denies any other medical concerns other than chronic back pain.

## 2022-02-07 ENCOUNTER — Inpatient Hospital Stay: Payer: Medicare Other

## 2022-02-07 ENCOUNTER — Encounter: Payer: Self-pay | Admitting: Oncology

## 2022-02-07 ENCOUNTER — Inpatient Hospital Stay (HOSPITAL_BASED_OUTPATIENT_CLINIC_OR_DEPARTMENT_OTHER): Payer: Medicare Other | Admitting: Oncology

## 2022-02-07 VITALS — HR 99

## 2022-02-07 VITALS — BP 162/79 | HR 108 | Temp 97.8°F | Resp 18 | Wt 223.4 lb

## 2022-02-07 DIAGNOSIS — I129 Hypertensive chronic kidney disease with stage 1 through stage 4 chronic kidney disease, or unspecified chronic kidney disease: Secondary | ICD-10-CM | POA: Diagnosis not present

## 2022-02-07 DIAGNOSIS — Z801 Family history of malignant neoplasm of trachea, bronchus and lung: Secondary | ICD-10-CM | POA: Diagnosis not present

## 2022-02-07 DIAGNOSIS — K7460 Unspecified cirrhosis of liver: Secondary | ICD-10-CM | POA: Diagnosis not present

## 2022-02-07 DIAGNOSIS — C7800 Secondary malignant neoplasm of unspecified lung: Secondary | ICD-10-CM | POA: Diagnosis not present

## 2022-02-07 DIAGNOSIS — Z87891 Personal history of nicotine dependence: Secondary | ICD-10-CM | POA: Diagnosis not present

## 2022-02-07 DIAGNOSIS — D696 Thrombocytopenia, unspecified: Secondary | ICD-10-CM

## 2022-02-07 DIAGNOSIS — D631 Anemia in chronic kidney disease: Secondary | ICD-10-CM

## 2022-02-07 DIAGNOSIS — R188 Other ascites: Secondary | ICD-10-CM | POA: Diagnosis not present

## 2022-02-07 DIAGNOSIS — C642 Malignant neoplasm of left kidney, except renal pelvis: Secondary | ICD-10-CM

## 2022-02-07 DIAGNOSIS — Z5112 Encounter for antineoplastic immunotherapy: Secondary | ICD-10-CM | POA: Diagnosis present

## 2022-02-07 DIAGNOSIS — Z800 Family history of malignant neoplasm of digestive organs: Secondary | ICD-10-CM | POA: Diagnosis not present

## 2022-02-07 DIAGNOSIS — D6959 Other secondary thrombocytopenia: Secondary | ICD-10-CM | POA: Diagnosis not present

## 2022-02-07 DIAGNOSIS — Z79899 Other long term (current) drug therapy: Secondary | ICD-10-CM | POA: Diagnosis not present

## 2022-02-07 DIAGNOSIS — Z905 Acquired absence of kidney: Secondary | ICD-10-CM | POA: Diagnosis not present

## 2022-02-07 DIAGNOSIS — N1832 Chronic kidney disease, stage 3b: Secondary | ICD-10-CM

## 2022-02-07 DIAGNOSIS — I10 Essential (primary) hypertension: Secondary | ICD-10-CM

## 2022-02-07 DIAGNOSIS — G893 Neoplasm related pain (acute) (chronic): Secondary | ICD-10-CM | POA: Diagnosis not present

## 2022-02-07 DIAGNOSIS — C7951 Secondary malignant neoplasm of bone: Secondary | ICD-10-CM | POA: Diagnosis present

## 2022-02-07 DIAGNOSIS — Z7984 Long term (current) use of oral hypoglycemic drugs: Secondary | ICD-10-CM | POA: Diagnosis not present

## 2022-02-07 DIAGNOSIS — R197 Diarrhea, unspecified: Secondary | ICD-10-CM | POA: Diagnosis not present

## 2022-02-07 DIAGNOSIS — Z923 Personal history of irradiation: Secondary | ICD-10-CM | POA: Diagnosis not present

## 2022-02-07 DIAGNOSIS — Z803 Family history of malignant neoplasm of breast: Secondary | ICD-10-CM | POA: Diagnosis not present

## 2022-02-07 DIAGNOSIS — C649 Malignant neoplasm of unspecified kidney, except renal pelvis: Secondary | ICD-10-CM

## 2022-02-07 LAB — CBC WITH DIFFERENTIAL/PLATELET
Abs Immature Granulocytes: 0.05 10*3/uL (ref 0.00–0.07)
Basophils Absolute: 0 10*3/uL (ref 0.0–0.1)
Basophils Relative: 1 %
Eosinophils Absolute: 0.1 10*3/uL (ref 0.0–0.5)
Eosinophils Relative: 3 %
HCT: 31.1 % — ABNORMAL LOW (ref 39.0–52.0)
Hemoglobin: 10.9 g/dL — ABNORMAL LOW (ref 13.0–17.0)
Immature Granulocytes: 1 %
Lymphocytes Relative: 8 %
Lymphs Abs: 0.4 10*3/uL — ABNORMAL LOW (ref 0.7–4.0)
MCH: 32.6 pg (ref 26.0–34.0)
MCHC: 35 g/dL (ref 30.0–36.0)
MCV: 93.1 fL (ref 80.0–100.0)
Monocytes Absolute: 0.5 10*3/uL (ref 0.1–1.0)
Monocytes Relative: 11 %
Neutro Abs: 3.5 10*3/uL (ref 1.7–7.7)
Neutrophils Relative %: 76 %
Platelets: 77 10*3/uL — ABNORMAL LOW (ref 150–400)
RBC: 3.34 MIL/uL — ABNORMAL LOW (ref 4.22–5.81)
RDW: 13.8 % (ref 11.5–15.5)
WBC: 4.6 10*3/uL (ref 4.0–10.5)
nRBC: 0 % (ref 0.0–0.2)

## 2022-02-07 LAB — COMPREHENSIVE METABOLIC PANEL
ALT: 18 U/L (ref 0–44)
AST: 30 U/L (ref 15–41)
Albumin: 3.8 g/dL (ref 3.5–5.0)
Alkaline Phosphatase: 109 U/L (ref 38–126)
Anion gap: 10 (ref 5–15)
BUN: 35 mg/dL — ABNORMAL HIGH (ref 6–20)
CO2: 22 mmol/L (ref 22–32)
Calcium: 9.3 mg/dL (ref 8.9–10.3)
Chloride: 101 mmol/L (ref 98–111)
Creatinine, Ser: 2.34 mg/dL — ABNORMAL HIGH (ref 0.61–1.24)
GFR, Estimated: 31 mL/min — ABNORMAL LOW (ref >60)
Glucose, Bld: 157 mg/dL — ABNORMAL HIGH (ref 70–99)
Potassium: 4.1 mmol/L (ref 3.5–5.1)
Sodium: 133 mmol/L — ABNORMAL LOW (ref 135–145)
Total Bilirubin: 1.3 mg/dL — ABNORMAL HIGH (ref 0.3–1.2)
Total Protein: 7.8 g/dL (ref 6.5–8.1)

## 2022-02-07 LAB — T4, FREE: Free T4: 1.22 ng/dL — ABNORMAL HIGH (ref 0.61–1.12)

## 2022-02-07 LAB — TSH: TSH: 3.788 u[IU]/mL (ref 0.350–4.500)

## 2022-02-07 MED ORDER — HEPARIN SOD (PORK) LOCK FLUSH 100 UNIT/ML IV SOLN
500.0000 [IU] | Freq: Once | INTRAVENOUS | Status: AC | PRN
  Filled 2022-02-07: qty 5

## 2022-02-07 MED ORDER — HEPARIN SOD (PORK) LOCK FLUSH 100 UNIT/ML IV SOLN
INTRAVENOUS | Status: AC
  Administered 2022-02-07: 500 [IU]
  Filled 2022-02-07: qty 5

## 2022-02-07 MED ORDER — SODIUM CHLORIDE 0.9 % IV SOLN
Freq: Once | INTRAVENOUS | Status: AC
  Filled 2022-02-07: qty 250

## 2022-02-07 MED ORDER — SODIUM CHLORIDE 0.9 % IV SOLN
240.0000 mg | Freq: Once | INTRAVENOUS | Status: AC
  Administered 2022-02-07: 240 mg via INTRAVENOUS
  Filled 2022-02-07: qty 24

## 2022-02-07 NOTE — Assessment & Plan Note (Signed)
Continue oral iron supplementation  hemoglobin stable.  Monitor counts.

## 2022-02-07 NOTE — Assessment & Plan Note (Signed)
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   Count is stable.  Monitor

## 2022-02-07 NOTE — Progress Notes (Signed)
Patient here for follow up. No new concerns voiced.  °

## 2022-02-07 NOTE — Assessment & Plan Note (Signed)
Hypertension,Tachycardia Recommend patient to resume Coreg 25 mg twice daily.

## 2022-02-07 NOTE — Assessment & Plan Note (Addendum)
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 Labs reviewed and discussed with patient Proceed with nivolumab. Check CT chest abdomen pelvis without contrast in 2022-03-30.

## 2022-02-07 NOTE — Patient Instructions (Signed)
[facility]  Discharge Instructions: Thank you for choosing [facility] to provide your oncology and hematology care.  If you have a lab appointment with the Cancer Center, please go directly to the Cancer Center and check in at the registration area.  Wear comfortable clothing and clothing appropriate for easy access to any Portacath or PICC line.   We strive to give you quality time with your provider. You may need to reschedule your appointment if you arrive late (15 or more minutes).  Arriving late affects you and other patients whose appointments are after yours.  Also, if you miss three or more appointments without notifying the office, you may be dismissed from the clinic at the provider's discretion.      For prescription refill requests, have your pharmacy contact our office and allow 72 hours for refills to be completed.    Today you received the following chemotherapy and/or immunotherapy agents Nivolumab       To help prevent nausea and vomiting after your treatment, we encourage you to take your nausea medication as directed.  BELOW ARE SYMPTOMS THAT SHOULD BE REPORTED IMMEDIATELY: *FEVER GREATER THAN 100.4 F (38 C) OR HIGHER *CHILLS OR SWEATING *NAUSEA AND VOMITING THAT IS NOT CONTROLLED WITH YOUR NAUSEA MEDICATION *UNUSUAL SHORTNESS OF BREATH *UNUSUAL BRUISING OR BLEEDING *URINARY PROBLEMS (pain or burning when urinating, or frequent urination) *BOWEL PROBLEMS (unusual diarrhea, constipation, pain near the anus) TENDERNESS IN MOUTH AND THROAT WITH OR WITHOUT PRESENCE OF ULCERS (sore throat, sores in mouth, or a toothache) UNUSUAL RASH, SWELLING OR PAIN  UNUSUAL VAGINAL DISCHARGE OR ITCHING   Items with * indicate a potential emergency and should be followed up as soon as possible or go to the Emergency Department if any problems should occur.  Please show the CHEMOTHERAPY ALERT CARD or IMMUNOTHERAPY ALERT CARD at check-in to  the Emergency Department and triage nurse.  Should you have questions after your visit or need to cancel or reschedule your appointment, please contact [facility]  [phone] and follow the prompts.  Office hours are 8:00 a.m. to 4:30 p.m. Monday - Friday. Please note that voicemails left after 4:00 p.m. may not be returned until the following business day.  We are closed weekends and major holidays. You have access to a nurse at all times for urgent questions. Please call the main number to the clinic [phone] and follow the prompts.  For any non-urgent questions, you may also contact your provider using MyChart. We now offer e-Visits for anyone 18 and older to request care online for non-urgent symptoms. For details visit [email or web address].   Also download the MyChart app! Go to the app store, search "MyChart", open the app, select [facility], and log in with your MyChart username and password.  Masks are optional in the cancer centers. If you would like for your care team to wear a mask while they are taking care of you, please let them know. For doctor visits, patients may have with them one support person who is at least 59 years old. At this time, visitors are not allowed in the infusion area.   

## 2022-02-07 NOTE — Assessment & Plan Note (Signed)
Continue follow-up with gastroenterology.  Continue Lasix and spironolactone.

## 2022-02-07 NOTE — Assessment & Plan Note (Signed)
T9 lesion, s/p resection and spine fusion.  Pathology is positive for RCC. S/p palliative RT.  Continue calcium [1200 mg] and vitamin D supplementation.  Xgeva monthly.

## 2022-02-07 NOTE — Progress Notes (Signed)
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 Labs reviewed and discussed with patient Proceed with nivolumab. Check CT chest abdomen pelvis without contrast in August 2023.  Anemia due to stage 3b chronic kidney disease (HCC) Continue oral iron supplementation  hemoglobin stable.  Monitor counts.  Cirrhosis of liver with ascites ([place]) Continue follow-up with gastroenterology.  Continue Lasix and spironolactone.  Metastasis to bone (HCC) T9 lesion, s/p resection and spine fusion.  Pathology is positive for RCC. S/p palliative RT.  Continue calcium [1200 mg] and vitamin D supplementation.  Xgeva monthly.   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   Count is stable.  Monitor  Benign essential hypertension Hypertension,Tachycardia Recommend patient to resume Coreg 25 mg twice daily.     Orders Placed This Encounter  Procedures   CT CHEST ABDOMEN PELVIS WO CONTRAST    Standing Status:   Future    Standing Expiration Date:   02/08/2023    Order Specific Question:   If indicated for the ordered procedure, I authorize the administration of contrast media per Radiology protocol    Answer:   Yes    Order Specific Question:   Preferred imaging location?    Answer:   [facility]    Order Specific Question:   Is Oral Contrast requested for this exam?    Answer:   Yes, Per Radiology protocol    Follow up in 2 weeks.  Lab MD nivolumab [place] See  All questions were answered. The patient knows to call the clinic with any problems, questions or concerns.  Earlie Server, MD, PhD [facility] Hematology Oncology 02/07/2022     CHIEF  COMPLAINTS/REASON FOR VISIT:  Follow-up for kidney cancer treatments  HISTORY OF PRESENTING ILLNESS:   Henry Moore is a  61 y.o.  male with PMH listed below was seen in consultation at the request of  Albina Billet, MD  for follow up of metastatic kidney cancer Oncology History  Renal cell carcinoma ([place])  10/16/2018 Initial Diagnosis   Renal cell carcinoma -10/16/2018 Left nephrectomy showed renal cell carcinoma, clear-cell type, nuclear grade 4, tumor extends into the renal vein and renal sinus fat.  Urethral, vascular and or resection margins are negative for tumor pT3a Nx Mx NGS -foundation one PD-L1 TPS 0% no reportable mutation, MS stable. TMB 8 muts/mb VHL D173f*16, SETD2 BAP   07/14/2019 Cancer Staging   Staging form: Kidney, AJCC 8th Edition - Clinical stage from 07/14/2019: Stage IV (cTX, cNX, cM1) - Signed by YEarlie Server MD on 08/05/2020   07/14/2019 Progression   01/28/2019 patient had another CT chest abdomen pelvis done with contrast Showed interval left nephrectomy.  Scattered tiny pulmonary nodules bilaterally.  Nonspecific.  No other evidence of metastatic disease.  Cirrhosis changes extensive coronary and aortic atherosclerosis 07/14/2019 patient underwent surveillance CT chest abdomen pelvis without contrast Interval progression of pulmonary metastasis with new enlarged right paratracheal lymph node 1.7 cm, previously 0.6 cm one-point since worrisome for metastatic adenopathy.  Multiple progressive pulmonary nodules, index nodule within the lingula measures 1.3 cm, previously 3 mm. Index subpleural nodule within the anterior right upper lobe measuring 1.8 cm, previously 3 cm Index nodule within the post lateral  right lower lobe measuring 5 mm, new from previous care Aortic sclerosis.  Three-vessel coronary artery calcification noted.  Cirrhosis changes.  Splenomegaly.     08/14/2019 -  Chemotherapy   #08/14/2019-10/16/2019 nivolumab and ipilimumab Q21D #11/06/2019-nivolumab  every 2 weeks maintenance.     10/05/2019 Genetic Testing   Negative genetic testing. No pathogenic variants identified on the [facility] Multi-Cancer Panel. VUS in [place] called  c.4922T>C (p.Leu1641Pro) identified. The report date is 10/05/2019.  The Multi-Cancer Panel offered by [facility] includes sequencing and/or deletion duplication testing of the following 85 genes: AIP, ALK, APC, ATM, AXIN2,BAP1,  BARD1, BLM, BMPR1A, BRCA1, BRCA2, BRIP1, CASR, CDC73, CDH1, CDK4, CDKN1B, CDKN1C, CDKN2A (p14ARF), CDKN2A (p16INK4a), CEBPA, CHEK2, CTNNA1, DICER1, DIS3L2, EGFR (c.2369C>T, p.Thr790Met variant only), EPCAM (Deletion/duplication testing only), FH, FLCN, GATA2, GPC3, GREM1 (Promoter region deletion/duplication testing only), HOXB13 (c.251G>A, p.Gly84Glu), HRAS, KIT, MAX, MEN1, MET, MITF (c.952G>A, p.Glu318Lys variant only), MLH1, MSH2, MSH3, MSH6, MUTYH, NBN, NF1, NF2, NTHL1, PALB2, PDGFRA, PHOX2B, PMS2, POLD1, POLE, POT1, PRKAR1A, PTCH1, PTEN, RAD50, RAD51C, RAD51D, RB1, RECQL4, RET, RNF43, RUNX1, SDHAF2, SDHA (sequence changes only), SDHB, SDHC, SDHD, SMAD4, SMARCA4, SMARCB1, SMARCE1, STK11, SUFU, TERC, TERT, TMEM127, TP53, TSC1, TSC2, VHL, WRN and WT1.    10/12/2020 Imaging   10/12/2020 CT chest abdomen pelvis with out contrast. New 15 mm lytic lesion in the right T9 vertebral body.  Suspicious for isolated lytic metastasis.  No findings of suspicious soft tissue metastasis in the chest abdomen or pelvis.  Residual postinfectious inflammatory scarring in the lung bilaterally.  Cirrhosis.  Splenomegaly.  Minimal ascites.   # 10/26/20 Bone scan showed Minimal increased activity noted at the right costovertebral junction at the T9 level is most likely degenerative. Similar finding noted on prior bone scan of 08/11/2019. No definite T9 vertebral body abnormal activity corresponding to lytic lesion noted on recent CT. Again the recently identified T9 vertebral body lytic lesion is suspicious and further evaluation of  the thoracic spine with MRI can be obtained. No other bony abnormalities noted by bone scan. I discussed patient's case on tumor board 11/04/2020.  Bone scan probably is not sensitive in detecting lytic lesion from [place].  Consensus reached upon clinically based on the CT scan, new bone lesion is highly likely metastasis from [place].  Options including proceed with bone lesion biopsy versus referral to radiation oncology for empiric palliative radiation.  Patient has other comorbidities including cirrhosis/pancytopenia, patient opts to proceed with empiric palliative radiation.   12/13/2020 -  Radiation Therapy   finished RT to T9   12/2020 [facility] Admission   June 2022 abscess of anal area , s/ drainage and antibiotics. July 2022, immunotherapy was held due to diarrhea.     03/18/2021 -  Chemotherapy   03/18/2021, resumed on nivolumab treatment x1. 04/01/2021, hold nivolumab due to diarrhea and upcoming surgery. # 04/08/2021 paracentesis. Cytology negative for malignancy.  # 04/14/2021- 04/16/2021 admitted to [facility] due to abdominal distension. S/p paracentesis on 04/15/2021 , cytology negative.  # 04/22/21 right T9 transpedicular mass resection. T7-11 posterior spinal fusion by Dr. Cari Caraway at [facility] # 05/06/2021 abdominal distention. S/p repeat therapeutic paracentesis.    05/30/2021 Imaging    CT chest abdomen pelvis showed 8 mm left upper lobe pulmonary nodule, increased size compared to 2 mm previously.  Interval progression highly concerning for metastatic disease.  Primary bronchogenic neoplasm is also another consideration. Interval thoracic fusion above and below the T10.  Previously described tiny lucency inferior L3 vertebral body is stable.  Cirrhosis  with splenomegaly and small volume ascites.  Cholelithiasis.  Aortic atherosclerosis   08/12/2021 -  Radiation Therapy   status post stereotactic radiation to lung   09/14/2021 Imaging   CT chest abdomen pelvis without contrast showed 0.8 cm  subpleural nodule of the left lower lobe, previously 0.5 cm.  Other small nodules are unchanged.  Unchanged lytic lesion of T9 with bridging fusion of the lower thoracic spine.  Unchanged tiny lucent lesion of the inferior endplate of L3.  Nonspecific. Unchanged prominent retroperitoneal lymph node and adjacent fat stranding.  Attention on follow-up.  No noncontrast evidence of new soft tissue metastasis in the abdomen or pelvis.  Status post left nephrectomy.  Cirrhosis and portal hypertension with trace perihepatic ascites.  Coronary artery disease   12/06/2021 Imaging   CT showed interval enlargement of the small right lower lobe pulm nodules, 0.7 cm and 0.6 cm.  Other lung nodules are unchanged.  New groundglass adjacent to the lingular nodule suggesting radiation fibrosis. Unchanged lytic osseous lesion of T9 with posterior laminectomy of T9 and bridging fusion of T7-T11.  Unchanged tiny lytic lesion in the inferior endplate of L3.  Unchanged prominent left retroperitoneal lymph nodes and adjacent fat stranding.  Cirrhosis and portal hypertension with gross splenomegaly.  Small volume of ascites.  Slightly increased.  Coronary artery disease 12/08/2021, bone scan showed Bone metastasis at T9, probably T10. The CT noted inferior endplate small L3 lesion does not have definite increased radiotracer uptake.   Given that both lung nodules are still less than 1 cm, would not be able to be biopsied or radiated.  Recommend observation..  Attention future studies.   Metastasis to bone [facility])       INTERVAL HISTORY Henry Moore is a 61 y.o. male who has above history reviewed by me today presents for follow up visit for management of stage IV RCC Denies nausea vomiting diarrhea. Patient was seen by Dr. Baruch Gouty on 01/12/2022.  Note reviewed. Tachycardia with heart rate of 108.  Blood pressure 162/79. He has been off his Coreg since last year.  Review of Systems  Constitutional:  Positive for  fatigue. Negative for appetite change, chills, fever and unexpected weight change.  HENT:   Negative for hearing loss and voice change.   Eyes:  Negative for eye problems and icterus.  Respiratory:  Negative for chest tightness, cough and shortness of breath.   Cardiovascular:  Negative for chest pain and leg swelling.  Gastrointestinal:  Negative for abdominal distention, abdominal pain, diarrhea, nausea and vomiting.  Endocrine: Negative for hot flashes.  Genitourinary:  Negative for difficulty urinating, dysuria and frequency.   Musculoskeletal:  Positive for arthralgias.       Chronic back pain  Skin:  Negative for itching and rash.  Neurological:  Negative for light-headedness and numbness.  Hematological:  Negative for adenopathy. Does not bruise/bleed easily.  Psychiatric/Behavioral:  Negative for confusion.     MEDICAL HISTORY:  Past Medical History:  Diagnosis Date   Cough    SINUS DRAINAGE CAUSING COUGHING , REPORTS CLEAR MUCOUS    Diabetes mellitus without complication (HCC)    Family history of breast cancer    Family history of colon cancer    Family history of stomach cancer    HTN (hypertension)    Hyperlipemia    Left renal mass    Platelets decreased ([place])    DENIES UNSUAL BLEEDING    PONV (postoperative nausea and vomiting)    Renal cell carcinoma ([place])  08/04/2019   WBC decreased     SURGICAL HISTORY: Past Surgical History:  Procedure Laterality Date   ANKLE SURGERY Left    ANTERIOR CERVICAL DECOMP/DISCECTOMY FUSION N/A 04/16/2014   Procedure: ANTERIOR CERVICAL DECOMPRESSION/DISCECTOMY FUSION  (ACDF C5-C7)   (2 LEVELS)     ;  Surgeon: Melina Schools, MD;  Location: [place];  Service: Orthopedics;  Laterality: N/A;   APPENDECTOMY     BACK SURGERY     ESOPHAGOGASTRODUODENOSCOPY (EGD) WITH PROPOFOL N/A 02/06/2019   Procedure: ESOPHAGOGASTRODUODENOSCOPY (EGD) WITH PROPOFOL;  Surgeon: [name], MD;  Location: [facility] ENDOSCOPY;  Service: Gastroenterology;  Laterality:  N/A;   ESOPHAGOGASTRODUODENOSCOPY (EGD) WITH PROPOFOL N/A 03/04/2019   Procedure: ESOPHAGOGASTRODUODENOSCOPY (EGD) WITH PROPOFOL;  Surgeon: Lin Landsman, MD;  Location: [place];  Service: Gastroenterology;  Laterality: N/A;   ESOPHAGOGASTRODUODENOSCOPY (EGD) WITH PROPOFOL N/A 04/22/2019   Procedure: ESOPHAGOGASTRODUODENOSCOPY (EGD) WITH PROPOFOL;  Surgeon: [name], MD;  Location: [facility] ENDOSCOPY;  Service: Gastroenterology;  Laterality: N/A;   FRACTURE SURGERY     HYDROCELE EXCISION     KNEE ARTHROSCOPY Bilateral    LAPAROSCOPIC NEPHRECTOMY, HAND ASSISTED Left 10/16/2018   Procedure: LEFT HAND ASSISTED LAPAROSCOPIC NEPHRECTOMY;  Surgeon: Lucas Mallow, MD;  Location: [facility];  Service: Urology;  Laterality: Left;   NASAL SINUS SURGERY     X 2   PENILE PROSTHESIS IMPLANT  10 YEARS AGO   PORTA CATH INSERTION N/A 11/11/2019   Procedure: PORTA CATH INSERTION;  Surgeon: Katha Cabal, MD;  Location: [facility];  Service: Cardiovascular;  Laterality: N/A;   SHOULDER SURGERY Left     SOCIAL HISTORY: Social History   Socioeconomic History   Marital status: Married    Spouse name: Not on file   Number of children: Not on file   Years of education: Not on file   Highest education level: Not on file  Occupational History   Not on file  Tobacco Use   Smoking status: Former    Packs/day: 0.25    Years: 20.00    Total pack years: 5.00    Types: Cigarettes    Quit date: 2015    Years since quitting: 8.5   Smokeless tobacco: Never  Vaping Use   Vaping Use: Never used  Substance and Sexual Activity   Alcohol use: Not Currently    Alcohol/week: 0.0 standard drinks of alcohol    Comment: no alchol in 1 year   Drug use: No   Sexual activity: Not on file  Other Topics Concern   Not on file  Social History Narrative   Not on file   Social Determinants of Health   Financial Resource Strain: Not on file  Food Insecurity: Not on file  Transportation Needs:  Not on file  Physical Activity: Not on file  Stress: Not on file  Social Connections: Not on file  Intimate Partner Violence: Not on file    FAMILY HISTORY: Family History  Problem Relation Age of Onset   Diabetes Mother    Cancer Mother        neuroendocrine cancer   Cancer Father        neuroendocrine cancer of meninges   Cancer Maternal Grandmother        tomach dx 41   Alzheimer's disease Paternal Grandmother    Lung cancer Paternal Grandfather    Lung cancer Maternal Aunt    Colon cancer Maternal Uncle    Breast cancer Paternal Aunt  both dx 21s   Ovarian cancer Other        dx 24   Breast cancer Cousin     ALLERGIES:  is allergic to codeine and mucinex [guaifenesin er].  MEDICATIONS:  Current Outpatient Medications  Medication Sig Dispense Refill   ACCU-CHEK AVIVA PLUS test strip CHECK BL00D SUGAR ONCE OR TWICE DAILY. 100 each PRN   calcium carbonate (OS-CAL - DOSED IN MG OF ELEMENTAL CALCIUM) 1250 (500 Ca) MG tablet Take 1 tablet by mouth.     Cholecalciferol (VITAMIN D3) 125 MCG (5000 UT) CAPS Take 5,000 Units by mouth daily.     furosemide (LASIX) 40 MG tablet Take 1 tablet (40 mg total) by mouth daily. 30 tablet 0   glipiZIDE (GLUCOTROL) 5 MG tablet Take 5 mg by mouth daily. Tablet(s) By Mouth Daily     metFORMIN (GLUCOPHAGE) 500 MG tablet Take 500 mg by mouth daily with breakfast. 2 Tablet(s) By Mouth Twice Daily     pravastatin (PRAVACHOL) 20 MG tablet Take 1 tablet (20 mg total) by mouth every evening. 30 tablet 11   spironolactone (ALDACTONE) 100 MG tablet Take 1 tablet (100 mg total) by mouth daily. 30 tablet 0   vitamin B-12 (CYANOCOBALAMIN) 1000 MCG tablet Take 1 tablet (1,000 mcg total) by mouth daily. 90 tablet 0   carvedilol (COREG) 25 MG tablet Take 1 tablet (25 mg total) by mouth 2 (two) times daily with a meal. (Patient not taking: Reported on 10/18/2021) 180 tablet 3   fentaNYL (DURAGESIC) 12 MCG/HR Place 1 patch onto the skin every 3 (three)  days. (Patient not taking: Reported on 09/19/2021) 5 patch 0   loperamide (IMODIUM A-D) 2 MG tablet Take 2 mg by mouth 4 (four) times daily as needed for diarrhea or loose stools. (Patient not taking: Reported on 11/01/2021)     prochlorperazine (COMPAZINE) 10 MG tablet Take 1 tablet (10 mg total) by mouth every 6 (six) hours as needed for nausea or vomiting. (Patient not taking: Reported on 12/13/2021) 30 tablet 0   promethazine (PHENERGAN) 25 MG tablet Take 1 tablet (25 mg total) by mouth every 6 (six) hours as needed for nausea or vomiting. (Patient not taking: Reported on 11/01/2021) 30 tablet 0   Current Facility-Administered Medications  Medication Dose Route Frequency Provider Last Rate Last Admin   albumin human 25 % solution 25 g  25 g Intravenous Once [name], MD       Facility-Administered Medications Ordered in Other Visits  Medication Dose Route Frequency Provider Last Rate Last Admin   sodium chloride flush (NS) 0.9 % injection 10 mL  10 mL Intravenous PRN Earlie Server, MD   10 mL at 01/22/20 0831     PHYSICAL EXAMINATION: ECOG PERFORMANCE STATUS: 1 - Symptomatic but completely ambulatory Vitals:   02/07/22 1323  BP: (!) 162/79  Pulse: (!) 108  Resp: 18  Temp: 97.8 F (36.6 C)   Filed Weights   02/07/22 1323  Weight: 223 lb 6.4 oz (101.3 kg)    Physical Exam Constitutional:      General: He is not in acute distress.    Appearance: He is obese.  HENT:     Head: Normocephalic and atraumatic.  Eyes:     General: No scleral icterus. Cardiovascular:     Rate and Rhythm: Normal rate and regular rhythm.     Heart sounds: Normal heart sounds.  Pulmonary:     Effort: Pulmonary effort is normal. No respiratory distress.  Breath sounds: No wheezing.  Abdominal:     General: Bowel sounds are normal. There is no distension.     Palpations: Abdomen is soft.  Musculoskeletal:        General: No deformity. Normal range of motion.     Cervical back: Normal range of motion  and neck supple.  Skin:    General: Skin is warm and dry.     Findings: No erythema or rash.  Neurological:     Mental Status: He is alert and oriented to person, place, and time. Mental status is at baseline.     Cranial Nerves: No cranial nerve deficit.     Coordination: Coordination normal.  Psychiatric:        Mood and Affect: Mood normal.     LABORATORY DATA:  I have reviewed the data as listed    Latest Ref Rng & Units 02/07/2022    1:11 PM 01/24/2022    1:08 PM 01/10/2022    1:42 PM  CBC  WBC 4.0 - 10.5 K/uL 4.6  3.9  2.4   Hemoglobin 13.0 - 17.0 g/dL 10.9  10.8  9.8   Hematocrit 39.0 - 52.0 % 31.1  31.1  28.2   Platelets 150 - 400 K/uL 77  73  61       Latest Ref Rng & Units 02/07/2022    1:11 PM 01/24/2022    1:08 PM 01/10/2022    1:42 PM  CMP  Glucose 70 - 99 mg/dL 157  171  187   BUN 6 - 20 mg/dL 35  42  31   Creatinine 0.61 - 1.24 mg/dL 2.34  2.24  1.96   Sodium 135 - 145 mmol/L 133  131  129   Potassium 3.5 - 5.1 mmol/L 4.1  4.2  4.4   Chloride 98 - 111 mmol/L 101  97  101   CO2 22 - 32 mmol/L _0 Calcium 8.9 - 10.3 mg/dL 9.3  10.1  8.2   Total Protein 6.5 - 8.1 g/dL 7.8  7.9  7.1   Total Bilirubin 0.3 - 1.2 mg/dL 1.3  1.1  0.9   Alkaline Phos 38 - 126 U/L 109  98  103   AST 15 - 41 U/L 30  30  35   ALT 0 - 44 U/L _1 Iron/TIBC/Ferritin/ %Sat    Component Value Date/Time   IRON 48 11/29/2021 0758   IRON 105 10/15/2018 1325   TIBC 298 11/29/2021 0758   TIBC 244 (L) 10/15/2018 1325   FERRITIN 117 11/29/2021 0758   FERRITIN 588 (H) 10/15/2018 1325   IRONPCTSAT 16 (L) 11/29/2021 0758   IRONPCTSAT 43 10/15/2018 1325      RADIOGRAPHIC STUDIES: I have personally reviewed the radiological images as listed and agreed with the findings in the report. NM Bone Scan Whole Body  Result Date: 12/08/2021 CLINICAL DATA:  History of renal cell carcinoma with known osseous metastasis. EXAM: NUCLEAR MEDICINE WHOLE BODY BONE SCAN TECHNIQUE:  Whole body anterior and posterior images were obtained approximately 3 hours after intravenous injection of radiopharmaceutical. RADIOPHARMACEUTICALS:  21.17 mCi Technetium-87mMDP IV COMPARISON:  CT chest abdomen pelvis Dec 06, 2021 FINDINGS: Abnormal increased radiotracer uptake is identified at T9 and probably T10 vertebral bodies suspicious for bony metastasis. Mild degenerative uptake of bilateral shoulders, elbows are noted. 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 The CT noted inferior endplate small L3  lesion does not have definite increased radiotracer uptake. Electronically Signed   By: Abelardo Diesel M.D.   On: 12/08/2021 11:57   CT CHEST ABDOMEN PELVIS WO CONTRAST  Result Date: 12/07/2021 CLINICAL DATA:  Metastatic renal cell carcinoma restaging * Tracking Code: BO * EXAM: CT CHEST, ABDOMEN AND PELVIS WITHOUT CONTRAST TECHNIQUE: Multidetector CT imaging of the chest, abdomen and pelvis was performed following the standard protocol without IV contrast. RADIATION DOSE REDUCTION: This exam was performed according to the departmental dose-optimization program which includes automated exposure control, adjustment of the mA and/or kV according to patient size and/or use of iterative reconstruction technique. COMPARISON:  09/14/2021 FINDINGS: CT CHEST FINDINGS Cardiovascular: Right chest port catheter. Aortic atherosclerosis. Normal heart size. Three-vessel coronary artery calcifications. No pericardial effusion. Mediastinum/Nodes: No enlarged mediastinal, hilar, or axillary lymph nodes. Thyroid gland, trachea, and esophagus demonstrate no significant findings. Lungs/Pleura: Unchanged 0.7 cm nodule of the posterior lingula (series 4, image 82). There is new irregular ground-glass and bandlike consolidation in the vicinity of this nodule (series 4, image 87). Unchanged 0.8 cm nodule of the left lower lobe posterior to the left hilum (series 4, image 70). Slight interval enlargement of a 0.6 cm nodule  of the peripheral right lower lobe, previously 0.4 cm (series 4, image 104). Interval enlargement of a nodule anteriorly in the right lower lobe measuring 0.7 cm, previously 0.3 cm (series 4, image 105). No pleural effusion or pneumothorax. Musculoskeletal: No chest wall mass. Unchanged lytic osseous lesion of the T9 vertebral body with posterior laminectomy of T9 and bridging fusion of T7 through T11 (series 6, image 116). CT ABDOMEN PELVIS FINDINGS Hepatobiliary: Coarse, nodular cirrhotic morphology of the liver. No obvious solid liver abnormality is seen. No gallstones, gallbladder wall thickening, or biliary dilatation. Pancreas: Unremarkable. No pancreatic ductal dilatation or surrounding inflammatory changes. Spleen: Splenomegaly, maximum coronal span 20.9 cm. Adrenals/Urinary Tract: Adrenal glands are unremarkable. Status post left nephrectomy. Normal noncontrast appearance of the right kidney, without renal calculi, solid lesion, or hydronephrosis. Bladder is unremarkable. Stomach/Bowel: Stomach is within normal limits. Appendix is not clearly visualized. No evidence of bowel wall thickening, distention, or inflammatory changes. Vascular/Lymphatic: Aortic atherosclerosis. Unchanged prominent left retroperitoneal lymph nodes measuring up to 1.1 x 0.8 cm (series 2, image 74). Adjacent retroperitoneal fat stranding. Reproductive: No mass or other abnormality. Penile implant with left lower quadrant reservoir. Other: No abdominal wall hernia or abnormality. Small volume ascites throughout the abdomen and pelvis, slightly increased compared to prior examination. Musculoskeletal: No acute osseous findings. Unchanged tiny lytic lesion of the inferior endplate of L3, again suspicious for a second small osseous metastasis (series 6, image 117). IMPRESSION: 1. Interval enlargement small right lower lobe nodules, consistent with enlargement pulmonary metastases. Left lower lobe and lingular nodules are unchanged, with  new ground-glass adjacent to the lingular nodule suggesting developing radiation fibrosis. 2. Status post left nephrectomy. 3. Unchanged lytic osseous lesion of the T9 vertebral body with posterior laminectomy of T9 and bridging fusion of T7 through T11. 4. Unchanged tiny lytic lesion of the inferior endplate of L3, again suspicious for a second small osseous metastasis. 5. Unchanged prominent left retroperitoneal lymph nodes and adjacent fat stranding, nonspecific in the setting of cirrhosis. 6. Morphologic stigmata of cirrhosis and portal hypertension with gross splenomegaly. Small volume ascites throughout the abdomen pelvis, slightly increased compared to prior examination. 7. Coronary artery disease. Aortic Atherosclerosis (ICD10-I70.0). Electronically Signed   By: Delanna Ahmadi M.D.   On: 12/07/2021 13:41

## 2022-02-17 ENCOUNTER — Encounter: Payer: Self-pay | Admitting: Oncology

## 2022-02-21 ENCOUNTER — Inpatient Hospital Stay (HOSPITAL_BASED_OUTPATIENT_CLINIC_OR_DEPARTMENT_OTHER): Payer: Medicare Other | Admitting: Oncology

## 2022-02-21 ENCOUNTER — Inpatient Hospital Stay: Payer: Medicare Other

## 2022-02-21 ENCOUNTER — Encounter: Payer: Self-pay | Admitting: Oncology

## 2022-02-21 DIAGNOSIS — R197 Diarrhea, unspecified: Secondary | ICD-10-CM

## 2022-02-21 DIAGNOSIS — N1832 Chronic kidney disease, stage 3b: Secondary | ICD-10-CM

## 2022-02-21 DIAGNOSIS — C642 Malignant neoplasm of left kidney, except renal pelvis: Secondary | ICD-10-CM | POA: Diagnosis not present

## 2022-02-21 DIAGNOSIS — C7951 Secondary malignant neoplasm of bone: Secondary | ICD-10-CM

## 2022-02-21 DIAGNOSIS — D696 Thrombocytopenia, unspecified: Secondary | ICD-10-CM

## 2022-02-21 DIAGNOSIS — R188 Other ascites: Secondary | ICD-10-CM

## 2022-02-21 DIAGNOSIS — K7460 Unspecified cirrhosis of liver: Secondary | ICD-10-CM | POA: Diagnosis not present

## 2022-02-21 DIAGNOSIS — Z95828 Presence of other vascular implants and grafts: Secondary | ICD-10-CM

## 2022-02-21 DIAGNOSIS — D631 Anemia in chronic kidney disease: Secondary | ICD-10-CM

## 2022-02-21 LAB — CBC WITH DIFFERENTIAL/PLATELET
Abs Immature Granulocytes: 0.02 10*3/uL (ref 0.00–0.07)
Basophils Absolute: 0 10*3/uL (ref 0.0–0.1)
Basophils Relative: 1 %
Eosinophils Absolute: 0.1 10*3/uL (ref 0.0–0.5)
Eosinophils Relative: 4 %
HCT: 27.9 % — ABNORMAL LOW (ref 39.0–52.0)
Hemoglobin: 9.8 g/dL — ABNORMAL LOW (ref 13.0–17.0)
Immature Granulocytes: 1 %
Lymphocytes Relative: 8 %
Lymphs Abs: 0.2 10*3/uL — ABNORMAL LOW (ref 0.7–4.0)
MCH: 32.8 pg (ref 26.0–34.0)
MCHC: 35.1 g/dL (ref 30.0–36.0)
MCV: 93.3 fL (ref 80.0–100.0)
Monocytes Absolute: 0.3 10*3/uL (ref 0.1–1.0)
Monocytes Relative: 11 %
Neutro Abs: 2.1 10*3/uL (ref 1.7–7.7)
Neutrophils Relative %: 75 %
Platelets: 71 10*3/uL — ABNORMAL LOW (ref 150–400)
RBC: 2.99 MIL/uL — ABNORMAL LOW (ref 4.22–5.81)
RDW: 14.2 % (ref 11.5–15.5)
WBC: 2.8 10*3/uL — ABNORMAL LOW (ref 4.0–10.5)
nRBC: 0 % (ref 0.0–0.2)

## 2022-02-21 LAB — COMPREHENSIVE METABOLIC PANEL
ALT: 19 U/L (ref 0–44)
AST: 33 U/L (ref 15–41)
Albumin: 3.4 g/dL — ABNORMAL LOW (ref 3.5–5.0)
Alkaline Phosphatase: 91 U/L (ref 38–126)
Anion gap: 9 (ref 5–15)
BUN: 51 mg/dL — ABNORMAL HIGH (ref 6–20)
CO2: 22 mmol/L (ref 22–32)
Calcium: 9.6 mg/dL (ref 8.9–10.3)
Chloride: 102 mmol/L (ref 98–111)
Creatinine, Ser: 2.7 mg/dL — ABNORMAL HIGH (ref 0.61–1.24)
GFR, Estimated: 26 mL/min — ABNORMAL LOW (ref >60)
Glucose, Bld: 251 mg/dL — ABNORMAL HIGH (ref 70–99)
Potassium: 4.4 mmol/L (ref 3.5–5.1)
Sodium: 133 mmol/L — ABNORMAL LOW (ref 135–145)
Total Bilirubin: 1 mg/dL (ref 0.3–1.2)
Total Protein: 7.4 g/dL (ref 6.5–8.1)

## 2022-02-21 MED ORDER — PREDNISONE 20 MG PO TABS: 40.0000 mg | ORAL_TABLET | ORAL | 0 refills | Status: AC

## 2022-02-21 MED ORDER — HEPARIN SOD (PORK) LOCK FLUSH 100 UNIT/ML IV SOLN
500.0000 [IU] | Freq: Once | INTRAVENOUS | Status: AC
  Administered 2022-02-21: 500 [IU] via INTRAVENOUS
  Filled 2022-02-21: qty 5

## 2022-02-21 MED ORDER — PREDNISONE 50 MG PO TABS: 50.0000 mg | ORAL_TABLET | Freq: Every day | ORAL | 0 refills | Status: AC

## 2022-02-21 NOTE — Assessment & Plan Note (Addendum)
Likely secondary to chemotherapy. 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 Recommend to hold off immunotherapy Recommend steroid 50 mg daily for a week followed by 40 mg daily for 1 week followed by 20 mg daily for a week.  Recommend patient to update me if symptoms not getting better. Discussed about Imodium.  Instructions

## 2022-02-21 NOTE — Assessment & Plan Note (Signed)
Continue follow-up with gastroenterology.  Continue Lasix and spironolactone.

## 2022-02-21 NOTE — Assessment & Plan Note (Signed)
Continue oral iron supplementation  hemoglobin stable.  Monitor counts.

## 2022-02-21 NOTE — Assessment & Plan Note (Signed)
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 Labs reviewed and discussed with patient Hold off Durvalumab treatments due to diarrhea Check CT chest abdomen pelvis without contrast in April 06, 2022.

## 2022-02-21 NOTE — Assessment & Plan Note (Signed)
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   Count is stable.  Monitor

## 2022-02-21 NOTE — Assessment & Plan Note (Signed)
T9 lesion, s/p resection and spine fusion.  Pathology is positive for RCC. S/p palliative RT.  Continue calcium [1200 mg] and vitamin D supplementation.  Xgeva monthly.-Hold Xgeva today due to acute diarrhea

## 2022-02-21 NOTE — Progress Notes (Signed)
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 Labs reviewed and discussed with patient Hold off Durvalumab treatments due to diarrhea Check CT chest abdomen pelvis without contrast in August 2023.  Diarrhea Likely secondary to chemotherapy. 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 Recommend to hold off immunotherapy Recommend steroid 50 mg daily for a week followed by 40 mg daily for 1 week followed by 20 mg daily for a week.  Recommend patient to update me if symptoms not getting better. Discussed about Imodium.  Instructions  Cirrhosis of liver with ascites ([place]) Continue follow-up with gastroenterology.  Continue Lasix and spironolactone.  Metastasis to bone (HCC) T9 lesion, s/p resection and spine fusion.  Pathology is positive for RCC. S/p palliative RT.  Continue calcium [1200 mg] and vitamin D supplementation.  Xgeva monthly.-Hold Xgeva today due to acute diarrhea   Anemia due to stage 3b chronic kidney disease (HCC) Continue oral iron supplementation  hemoglobin stable.  Monitor counts.  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   Count is stable.  Monitor    CKD, creatinine level is slightly worse.  Encourage oral hydration. No orders of the defined types were placed in this encounter.   Follow up in 3 weeks.  Lab MD  All questions were answered. The patient knows to call the clinic with any problems, questions or concerns.  Earlie Server, MD, PhD [facility] Hematology Oncology 02/21/2022     CHIEF COMPLAINTS/REASON FOR VISIT:  Follow-up for kidney cancer treatments  HISTORY OF PRESENTING ILLNESS:    Henry Moore is a  61 y.o.  male with PMH listed below was seen in consultation at the request of  Albina Billet, MD  for follow up of metastatic kidney cancer Oncology History  Renal cell carcinoma ([place])  10/16/2018 Initial Diagnosis   Renal cell carcinoma -10/16/2018 Left nephrectomy showed renal cell carcinoma, clear-cell type, nuclear grade 4, tumor extends into the renal vein and renal sinus fat.  Urethral, vascular and or resection margins are negative for tumor pT3a Nx Mx NGS -foundation one PD-L1 TPS 0% no reportable mutation, MS stable. TMB 8 muts/mb VHL [id number]*16, SETD2 BAP   07/14/2019 Cancer Staging   Staging form: Kidney, AJCC 8th Edition - Clinical stage from 07/14/2019: Stage IV (cTX, cNX, cM1) - Signed by YEarlie Server MD on 08/05/2020   07/14/2019 Progression   01/28/2019 patient had another CT chest abdomen pelvis done with contrast Showed interval left nephrectomy.  Scattered tiny pulmonary nodules bilaterally.  Nonspecific.  No other evidence of metastatic disease.  Cirrhosis changes extensive coronary and aortic atherosclerosis 07/14/2019 patient underwent surveillance CT chest abdomen pelvis without contrast Interval progression of pulmonary metastasis with new enlarged right paratracheal lymph node 1.7 cm, previously 0.6 cm one-point since worrisome for metastatic adenopathy.  Multiple progressive pulmonary nodules, index nodule within the lingula measures 1.3 cm, previously 3 mm. Index subpleural nodule within the anterior right upper lobe measuring 1.8 cm, previously 3 cm Index nodule within the post lateral right lower lobe measuring 5 mm, new from previous care Aortic sclerosis.  Three-vessel coronary artery calcification noted.  Cirrhosis changes.  Splenomegaly.     08/14/2019 -  Chemotherapy   #08/14/2019-10/16/2019 nivolumab  and ipilimumab Q21D #11/06/2019-nivolumab every 2 weeks maintenance.     10/05/2019 Genetic Testing   Negative genetic testing. No pathogenic  variants identified on the [facility] Multi-Cancer Panel. VUS in [place] called  c.4922T>C (p.Leu1641Pro) identified. The report date is 10/05/2019.  The Multi-Cancer Panel offered by [facility] includes sequencing and/or deletion duplication testing of the following 85 genes: AIP, ALK, APC, ATM, AXIN2,BAP1,  BARD1, BLM, BMPR1A, BRCA1, BRCA2, BRIP1, CASR, CDC73, CDH1, CDK4, CDKN1B, CDKN1C, CDKN2A (p14ARF), CDKN2A (p16INK4a), CEBPA, CHEK2, CTNNA1, DICER1, DIS3L2, EGFR (c.2369C>T, p.Thr790Met variant only), EPCAM (Deletion/duplication testing only), FH, FLCN, GATA2, GPC3, GREM1 (Promoter region deletion/duplication testing only), HOXB13 (c.251G>A, p.Gly84Glu), HRAS, KIT, MAX, MEN1, MET, MITF (c.952G>A, p.Glu318Lys variant only), MLH1, MSH2, MSH3, MSH6, MUTYH, NBN, NF1, NF2, NTHL1, PALB2, PDGFRA, PHOX2B, PMS2, POLD1, POLE, POT1, PRKAR1A, PTCH1, PTEN, RAD50, RAD51C, RAD51D, RB1, RECQL4, RET, RNF43, RUNX1, SDHAF2, SDHA (sequence changes only), SDHB, SDHC, SDHD, SMAD4, SMARCA4, SMARCB1, SMARCE1, STK11, SUFU, TERC, TERT, TMEM127, TP53, TSC1, TSC2, VHL, WRN and WT1.    10/12/2020 Imaging   10/12/2020 CT chest abdomen pelvis with out contrast. New 15 mm lytic lesion in the right T9 vertebral body.  Suspicious for isolated lytic metastasis.  No findings of suspicious soft tissue metastasis in the chest abdomen or pelvis.  Residual postinfectious inflammatory scarring in the lung bilaterally.  Cirrhosis.  Splenomegaly.  Minimal ascites.   # 10/26/20 Bone scan showed Minimal increased activity noted at the right costovertebral junction at the T9 level is most likely degenerative. Similar finding noted on prior bone scan of 08/11/2019. No definite T9 vertebral body abnormal activity corresponding to lytic lesion noted on recent CT. Again the recently identified T9 vertebral body lytic lesion is suspicious and further evaluation of the thoracic spine with MRI can be obtained. No other bony abnormalities noted by bone scan. I  discussed patient's case on tumor board 11/04/2020.  Bone scan probably is not sensitive in detecting lytic lesion from [place].  Consensus reached upon clinically based on the CT scan, new bone lesion is highly likely metastasis from [place].  Options including proceed with bone lesion biopsy versus referral to radiation oncology for empiric palliative radiation.  Patient has other comorbidities including cirrhosis/pancytopenia, patient opts to proceed with empiric palliative radiation.   12/13/2020 -  Radiation Therapy   finished RT to T9   12/2020 [facility] Admission   June 2022 abscess of anal area , s/ drainage and antibiotics. July 2022, immunotherapy was held due to diarrhea.     03/18/2021 -  Chemotherapy   03/18/2021, resumed on nivolumab treatment x1. 04/01/2021, hold nivolumab due to diarrhea and upcoming surgery. # 04/08/2021 paracentesis. Cytology negative for malignancy.  # 04/14/2021- 04/16/2021 admitted to [facility] due to abdominal distension. S/p paracentesis on 04/15/2021 , cytology negative.  # 04/22/21 right T9 transpedicular mass resection. T7-11 posterior spinal fusion by Dr. Cari Caraway at [facility] # 05/06/2021 abdominal distention. S/p repeat therapeutic paracentesis.    05/30/2021 Imaging    CT chest abdomen pelvis showed 8 mm left upper lobe pulmonary nodule, increased size compared to 2 mm previously.  Interval progression highly concerning for metastatic disease.  Primary bronchogenic neoplasm is also another consideration. Interval thoracic fusion above and below the T10.  Previously described tiny lucency inferior L3 vertebral body is stable.  Cirrhosis with splenomegaly and small volume ascites.  Cholelithiasis.  Aortic atherosclerosis   08/12/2021 -  Radiation Therapy   status post stereotactic radiation to lung   09/14/2021 Imaging   CT chest abdomen  pelvis without contrast showed 0.8 cm subpleural nodule of the left lower lobe, previously 0.5 cm.  Other small nodules are unchanged.   Unchanged lytic lesion of T9 with bridging fusion of the lower thoracic spine.  Unchanged tiny lucent lesion of the inferior endplate of L3.  Nonspecific. Unchanged prominent retroperitoneal lymph node and adjacent fat stranding.  Attention on follow-up.  No noncontrast evidence of new soft tissue metastasis in the abdomen or pelvis.  Status post left nephrectomy.  Cirrhosis and portal hypertension with trace perihepatic ascites.  Coronary artery disease   12/06/2021 Imaging   CT showed interval enlargement of the small right lower lobe pulm nodules, 0.7 cm and 0.6 cm.  Other lung nodules are unchanged.  New groundglass adjacent to the lingular nodule suggesting radiation fibrosis. Unchanged lytic osseous lesion of T9 with posterior laminectomy of T9 and bridging fusion of T7-T11.  Unchanged tiny lytic lesion in the inferior endplate of L3.  Unchanged prominent left retroperitoneal lymph nodes and adjacent fat stranding.  Cirrhosis and portal hypertension with gross splenomegaly.  Small volume of ascites.  Slightly increased.  Coronary artery disease 12/08/2021, bone scan showed Bone metastasis at T9, probably T10. The CT noted inferior endplate small L3 lesion does not have definite increased radiotracer uptake.   Given that both lung nodules are still less than 1 cm, would not be able to be biopsied or radiated.  Recommend observation..  Attention future studies.   Metastasis to bone [facility])       INTERVAL HISTORY Henry Moore is a 61 y.o. male who has above history reviewed by me today presents for follow up visit for management of stage IV RCC Since last visit, he has experienced multiple episodes of loose bowel movements daily.  He uses Imodium. Some nausea in the morning which resolves spontaneously.  No fever, chills.  Is able to keep food 1. Review of Systems  Constitutional:  Positive for fatigue. Negative for appetite change, chills, fever and unexpected weight change.  HENT:   Negative  for hearing loss and voice change.   Eyes:  Negative for eye problems and icterus.  Respiratory:  Negative for chest tightness, cough and shortness of breath.   Cardiovascular:  Negative for chest pain and leg swelling.  Gastrointestinal:  Negative for abdominal distention, abdominal pain, diarrhea, nausea and vomiting.  Endocrine: Negative for hot flashes.  Genitourinary:  Negative for difficulty urinating, dysuria and frequency.   Musculoskeletal:  Positive for arthralgias.       Chronic back pain  Skin:  Negative for itching and rash.  Neurological:  Negative for light-headedness and numbness.  Hematological:  Negative for adenopathy. Does not bruise/bleed easily.  Psychiatric/Behavioral:  Negative for confusion.     MEDICAL HISTORY:  Past Medical History:  Diagnosis Date   Cough    SINUS DRAINAGE CAUSING COUGHING , REPORTS CLEAR MUCOUS    Diabetes mellitus without complication ([place])    Family history of breast cancer    Family history of colon cancer    Family history of stomach cancer    HTN (hypertension)    Hyperlipemia    Left renal mass    Platelets decreased ([place])    DENIES UNSUAL BLEEDING    PONV (postoperative nausea and vomiting)    Renal cell carcinoma ([place]) 08/04/2019   WBC decreased     SURGICAL HISTORY: Past Surgical History:  Procedure Laterality Date   ANKLE SURGERY Left    ANTERIOR CERVICAL DECOMP/DISCECTOMY FUSION N/A 04/16/2014  Procedure: ANTERIOR CERVICAL DECOMPRESSION/DISCECTOMY FUSION  (ACDF C5-C7)   (2 LEVELS)     ;  Surgeon: Melina Schools, MD;  Location: [facility];  Service: Orthopedics;  Laterality: N/A;   APPENDECTOMY     BACK SURGERY     ESOPHAGOGASTRODUODENOSCOPY (EGD) WITH PROPOFOL N/A 02/06/2019   Procedure: ESOPHAGOGASTRODUODENOSCOPY (EGD) WITH PROPOFOL;  Surgeon: [name], MD;  Location: [facility] ENDOSCOPY;  Service: Gastroenterology;  Laterality: N/A;   ESOPHAGOGASTRODUODENOSCOPY (EGD) WITH PROPOFOL N/A 03/04/2019   Procedure:  ESOPHAGOGASTRODUODENOSCOPY (EGD) WITH PROPOFOL;  Surgeon: Lin Landsman, MD;  Location: [place];  Service: Gastroenterology;  Laterality: N/A;   ESOPHAGOGASTRODUODENOSCOPY (EGD) WITH PROPOFOL N/A 04/22/2019   Procedure: ESOPHAGOGASTRODUODENOSCOPY (EGD) WITH PROPOFOL;  Surgeon: [name], MD;  Location: [facility] ENDOSCOPY;  Service: Gastroenterology;  Laterality: N/A;   FRACTURE SURGERY     HYDROCELE EXCISION     KNEE ARTHROSCOPY Bilateral    LAPAROSCOPIC NEPHRECTOMY, HAND ASSISTED Left 10/16/2018   Procedure: LEFT HAND ASSISTED LAPAROSCOPIC NEPHRECTOMY;  Surgeon: Lucas Mallow, MD;  Location: [facility];  Service: Urology;  Laterality: Left;   NASAL SINUS SURGERY     X 2   PENILE PROSTHESIS IMPLANT  10 YEARS AGO   PORTA CATH INSERTION N/A 11/11/2019   Procedure: PORTA CATH INSERTION;  Surgeon: Katha Cabal, MD;  Location: [place] CV LAB;  Service: Cardiovascular;  Laterality: N/A;   SHOULDER SURGERY Left     SOCIAL HISTORY: Social History   Socioeconomic History   Marital status: Married    Spouse name: Not on file   Number of children: Not on file   Years of education: Not on file   Highest education level: Not on file  Occupational History   Not on file  Tobacco Use   Smoking status: Former    Packs/day: 0.25    Years: 20.00    Total pack years: 5.00    Types: Cigarettes    Quit date: 2015    Years since quitting: 8.5   Smokeless tobacco: Never  Vaping Use   Vaping Use: Never used  Substance and Sexual Activity   Alcohol use: Not Currently    Alcohol/week: 0.0 standard drinks of alcohol    Comment: no alchol in 1 year   Drug use: No   Sexual activity: Not on file  Other Topics Concern   Not on file  Social History Narrative   Not on file   Social Determinants of Health   Financial Resource Strain: Not on file  Food Insecurity: Not on file  Transportation Needs: Not on file  Physical Activity: Not on file  Stress: Not on file  Social  Connections: Not on file  Intimate Partner Violence: Not on file    FAMILY HISTORY: Family History  Problem Relation Age of Onset   Diabetes Mother    Cancer Mother        neuroendocrine cancer   Cancer Father        neuroendocrine cancer of meninges   Cancer Maternal Grandmother        tomach dx 64   Alzheimer's disease Paternal Grandmother    Lung cancer Paternal Grandfather    Lung cancer Maternal Aunt    Colon cancer Maternal Uncle    Breast cancer Paternal Aunt        both dx 85s   Ovarian cancer Other        dx 62   Breast cancer Cousin     ALLERGIES:  is allergic to codeine  and mucinex [guaifenesin er].  MEDICATIONS:  Current Outpatient Medications  Medication Sig Dispense Refill   ACCU-CHEK AVIVA PLUS test strip CHECK BL00D SUGAR ONCE OR TWICE DAILY. 100 each PRN   calcium carbonate (OS-CAL - DOSED IN MG OF ELEMENTAL CALCIUM) 1250 (500 Ca) MG tablet Take 1 tablet by mouth.     Cholecalciferol (VITAMIN D3) 125 MCG (5000 UT) CAPS Take 5,000 Units by mouth daily.     furosemide (LASIX) 40 MG tablet Take 1 tablet (40 mg total) by mouth daily. 30 tablet 0   glipiZIDE (GLUCOTROL) 5 MG tablet Take 5 mg by mouth daily. Tablet(s) By Mouth Daily     metFORMIN (GLUCOPHAGE) 500 MG tablet Take 500 mg by mouth daily with breakfast. 2 Tablet(s) By Mouth Twice Daily     pravastatin (PRAVACHOL) 20 MG tablet Take 1 tablet (20 mg total) by mouth every evening. 30 tablet 11   [START ON 02/28/2022] predniSONE (DELTASONE) 20 MG tablet Take 2 tablets (40 mg total) by mouth See admin instructions. Take 68m daily for 2 weeks followed by 253mdaily for 1 week. 21 tablet 0   predniSONE (DELTASONE) 50 MG tablet Take 1 tablet (50 mg total) by mouth daily. 7 tablet 0   spironolactone (ALDACTONE) 100 MG tablet Take 1 tablet (100 mg total) by mouth daily. 30 tablet 0   vitamin B-12 (CYANOCOBALAMIN) 1000 MCG tablet Take 1 tablet (1,000 mcg total) by mouth daily. 90 tablet 0   carvedilol (COREG) 25 MG  tablet Take 1 tablet (25 mg total) by mouth 2 (two) times daily with a meal. (Patient not taking: Reported on 10/18/2021) 180 tablet 3   fentaNYL (DURAGESIC) 12 MCG/HR Place 1 patch onto the skin every 3 (three) days. (Patient not taking: Reported on 09/19/2021) 5 patch 0   loperamide (IMODIUM A-D) 2 MG tablet Take 2 mg by mouth 4 (four) times daily as needed for diarrhea or loose stools. (Patient not taking: Reported on 11/01/2021)     prochlorperazine (COMPAZINE) 10 MG tablet Take 1 tablet (10 mg total) by mouth every 6 (six) hours as needed for nausea or vomiting. (Patient not taking: Reported on 12/13/2021) 30 tablet 0   promethazine (PHENERGAN) 25 MG tablet Take 1 tablet (25 mg total) by mouth every 6 (six) hours as needed for nausea or vomiting. (Patient not taking: Reported on 11/01/2021) 30 tablet 0   Current Facility-Administered Medications  Medication Dose Route Frequency Provider Last Rate Last Admin   albumin human 25 % solution 25 g  25 g Intravenous Once AnJonathon BellowsMD       Facility-Administered Medications Ordered in Other Visits  Medication Dose Route Frequency Provider Last Rate Last Admin   sodium chloride flush (NS) 0.9 % injection 10 mL  10 mL Intravenous PRN YuEarlie ServerMD   10 mL at 01/22/20 0831     PHYSICAL EXAMINATION: ECOG PERFORMANCE STATUS: 1 - Symptomatic but completely ambulatory Vitals:   02/21/22 1309  BP: 121/65  Pulse: 79  Temp: (!) 96.6 F (35.9 C)   Filed Weights   02/21/22 1309  Weight: 222 lb (100.7 kg)    Physical Exam Constitutional:      General: He is not in acute distress.    Appearance: He is obese.  HENT:     Head: Normocephalic and atraumatic.  Eyes:     General: No scleral icterus. Cardiovascular:     Rate and Rhythm: Normal rate and regular rhythm.     Heart sounds: Normal heart  sounds.  Pulmonary:     Effort: Pulmonary effort is normal. No respiratory distress.     Breath sounds: No wheezing.  Abdominal:     General: Bowel sounds  are normal. There is no distension.     Palpations: Abdomen is soft.  Musculoskeletal:        General: No deformity. Normal range of motion.     Cervical back: Normal range of motion and neck supple.  Skin:    General: Skin is warm and dry.     Findings: No erythema or rash.  Neurological:     Mental Status: He is alert and oriented to person, place, and time. Mental status is at baseline.     Cranial Nerves: No cranial nerve deficit.     Coordination: Coordination normal.  Psychiatric:        Mood and Affect: Mood normal.     LABORATORY DATA:  I have reviewed the data as listed    Latest Ref Rng & Units 02/21/2022   12:54 PM 02/07/2022    1:11 PM 01/24/2022    1:08 PM  CBC  WBC 4.0 - 10.5 K/uL 2.8  4.6  3.9   Hemoglobin 13.0 - 17.0 g/dL 9.8  10.9  10.8   Hematocrit 39.0 - 52.0 % 27.9  31.1  31.1   Platelets 150 - 400 K/uL 71  77  73       Latest Ref Rng & Units 02/21/2022   12:54 PM 02/07/2022    1:11 PM 01/24/2022    1:08 PM  CMP  Glucose 70 - 99 mg/dL 251  157  171   BUN 6 - 20 mg/dL 51  35  42   Creatinine 0.61 - 1.24 mg/dL 2.70  2.34  2.24   Sodium 135 - 145 mmol/L 133  133  131   Potassium 3.5 - 5.1 mmol/L 4.4  4.1  4.2   Chloride 98 - 111 mmol/L 102  101  97   CO2 22 - 32 mmol/L 22  22  24    Calcium 8.9 - 10.3 mg/dL 9.6  9.3  10.1   Total Protein 6.5 - 8.1 g/dL 7.4  7.8  7.9   Total Bilirubin 0.3 - 1.2 mg/dL 1.0  1.3  1.1   Alkaline Phos 38 - 126 U/L 91  109  98   AST 15 - 41 U/L 33  30  30   ALT 0 - 44 U/L 19  18  18       Iron/TIBC/Ferritin/ %Sat    Component Value Date/Time   IRON 48 11/29/2021 0758   IRON 105 10/15/2018 1325   TIBC 298 11/29/2021 0758   TIBC 244 (L) 10/15/2018 1325   FERRITIN 117 11/29/2021 0758   FERRITIN 588 (H) 10/15/2018 1325   IRONPCTSAT 16 (L) 11/29/2021 0758   IRONPCTSAT 43 10/15/2018 1325      RADIOGRAPHIC STUDIES: I have personally reviewed the radiological images as listed and agreed with the findings in the report. NM  Bone Scan Whole Body  Result Date: 12/08/2021 CLINICAL DATA:  History of renal cell carcinoma with known osseous metastasis. EXAM: NUCLEAR MEDICINE WHOLE BODY BONE SCAN TECHNIQUE: Whole body anterior and posterior images were obtained approximately 3 hours after intravenous injection of radiopharmaceutical. RADIOPHARMACEUTICALS:  21.17 mCi Technetium-24mMDP IV COMPARISON:  CT chest abdomen pelvis Dec 06, 2021 FINDINGS: Abnormal increased radiotracer uptake is identified at T9 and probably T10 vertebral bodies suspicious for bony metastasis. Mild degenerative uptake of bilateral shoulders,  elbows are noted. 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 The CT noted inferior endplate small L3 lesion does not have definite increased radiotracer uptake. Electronically Signed   By: Abelardo Diesel M.D.   On: 12/08/2021 11:57   CT CHEST ABDOMEN PELVIS WO CONTRAST  Result Date: 12/07/2021 CLINICAL DATA:  Metastatic renal cell carcinoma restaging * Tracking Code: BO * EXAM: CT CHEST, ABDOMEN AND PELVIS WITHOUT CONTRAST TECHNIQUE: Multidetector CT imaging of the chest, abdomen and pelvis was performed following the standard protocol without IV contrast. RADIATION DOSE REDUCTION: This exam was performed according to the departmental dose-optimization program which includes automated exposure control, adjustment of the mA and/or kV according to patient size and/or use of iterative reconstruction technique. COMPARISON:  09/14/2021 FINDINGS: CT CHEST FINDINGS Cardiovascular: Right chest port catheter. Aortic atherosclerosis. Normal heart size. Three-vessel coronary artery calcifications. No pericardial effusion. Mediastinum/Nodes: No enlarged mediastinal, hilar, or axillary lymph nodes. Thyroid gland, trachea, and esophagus demonstrate no significant findings. Lungs/Pleura: Unchanged 0.7 cm nodule of the posterior lingula (series 4, image 82). There is new irregular ground-glass and bandlike consolidation in the vicinity  of this nodule (series 4, image 87). Unchanged 0.8 cm nodule of the left lower lobe posterior to the left hilum (series 4, image 70). Slight interval enlargement of a 0.6 cm nodule of the peripheral right lower lobe, previously 0.4 cm (series 4, image 104). Interval enlargement of a nodule anteriorly in the right lower lobe measuring 0.7 cm, previously 0.3 cm (series 4, image 105). No pleural effusion or pneumothorax. Musculoskeletal: No chest wall mass. Unchanged lytic osseous lesion of the T9 vertebral body with posterior laminectomy of T9 and bridging fusion of T7 through T11 (series 6, image 116). CT ABDOMEN PELVIS FINDINGS Hepatobiliary: Coarse, nodular cirrhotic morphology of the liver. No obvious solid liver abnormality is seen. No gallstones, gallbladder wall thickening, or biliary dilatation. Pancreas: Unremarkable. No pancreatic ductal dilatation or surrounding inflammatory changes. Spleen: Splenomegaly, maximum coronal span 20.9 cm. Adrenals/Urinary Tract: Adrenal glands are unremarkable. Status post left nephrectomy. Normal noncontrast appearance of the right kidney, without renal calculi, solid lesion, or hydronephrosis. Bladder is unremarkable. Stomach/Bowel: Stomach is within normal limits. Appendix is not clearly visualized. No evidence of bowel wall thickening, distention, or inflammatory changes. Vascular/Lymphatic: Aortic atherosclerosis. Unchanged prominent left retroperitoneal lymph nodes measuring up to 1.1 x 0.8 cm (series 2, image 74). Adjacent retroperitoneal fat stranding. Reproductive: No mass or other abnormality. Penile implant with left lower quadrant reservoir. Other: No abdominal wall hernia or abnormality. Small volume ascites throughout the abdomen and pelvis, slightly increased compared to prior examination. Musculoskeletal: No acute osseous findings. Unchanged tiny lytic lesion of the inferior endplate of L3, again suspicious for a second small osseous metastasis (series 6, image  117). IMPRESSION: 1. Interval enlargement small right lower lobe nodules, consistent with enlargement pulmonary metastases. Left lower lobe and lingular nodules are unchanged, with new ground-glass adjacent to the lingular nodule suggesting developing radiation fibrosis. 2. Status post left nephrectomy. 3. Unchanged lytic osseous lesion of the T9 vertebral body with posterior laminectomy of T9 and bridging fusion of T7 through T11. 4. Unchanged tiny lytic lesion of the inferior endplate of L3, again suspicious for a second small osseous metastasis. 5. Unchanged prominent left retroperitoneal lymph nodes and adjacent fat stranding, nonspecific in the setting of cirrhosis. 6. Morphologic stigmata of cirrhosis and portal hypertension with gross splenomegaly. Small volume ascites throughout the abdomen pelvis, slightly increased compared to prior examination. 7. Coronary artery disease. Aortic Atherosclerosis (ICD10-I70.0).  Electronically Signed   By: Delanna Ahmadi M.D.   On: 12/07/2021 13:41

## 2022-03-06 ENCOUNTER — Telehealth: Payer: Self-pay | Admitting: *Deleted

## 2022-03-06 ENCOUNTER — Telehealth: Payer: Self-pay | Admitting: Gastroenterology

## 2022-03-06 ENCOUNTER — Telehealth: Payer: Self-pay

## 2022-03-06 DIAGNOSIS — K7460 Unspecified cirrhosis of liver: Secondary | ICD-10-CM

## 2022-03-06 MED ORDER — ALBUMIN HUMAN 25 % IV SOLN: 25.0000 g | Freq: Once | INTRAVENOUS | Status: AC

## 2022-03-06 NOTE — Addendum Note (Signed)
Addended by: Wayna Chalet on: 03/06/2022 02:51 PM   Modules accepted: Orders

## 2022-03-06 NOTE — Telephone Encounter (Signed)
Patient needs some medical advice. States he has fluid in his stomach and it is hurting. Requesting call back.

## 2022-03-06 NOTE — Telephone Encounter (Signed)
Please read other telephone note from 03/06/2022.

## 2022-03-06 NOTE — Telephone Encounter (Signed)
Called patient to let him know that I was able to place the order for him to get a paracentesis pretty soon. I also provided him with the specialty schedulers number so he could call them in the future. Patient had no further questions.

## 2022-03-06 NOTE — Telephone Encounter (Signed)
Hi Dr. Allen Norris, would you be able to help? I was contacted by the cancer center that they will not be able to help the patient. Can I go ahead and order a paracentesis for this patient even though he had not been seen by Dr. [name] for a while? Please advise.

## 2022-03-06 NOTE — Telephone Encounter (Signed)
Patient called stating that he has abdominal ascites and that he doesn't know what to do. Therefore, he called our office first. He stated that Dr. [name] had ordered paracentesis before in him and wanted to know if he could do it again. I told patient that he is currently on vacation and doesn't return until 03/20/22. But I told him to contact Dr. Collie Siad office and ask if she or her PA could order a paracentesis. Patient stated that he would call them as well. I also reached out to Beckey Rutter, PA from the cancer center and see if she could also help him since Dr. Tasia Catchings is out of the office. Hopefully the patient could get help from them since they are also able to order paracentesis for a patient. I advised patient to give the cancer center a call and ask for an order for a paracentesis and if they were not able to help him, then to please go to the ED. Patient stated that he would call them to let them know. I told him to call us if he couldn't get help. Patient agreed.

## 2022-03-06 NOTE — Telephone Encounter (Signed)
Patient - Left voice on triage. States that he is having "fluid build up on his abdomen and is uncomfortable." He is having trouble "eating/drinking." He feels that the he needs a paracentesis.  He has left a msg for Dr. Georgeann Oppenheim teams, but Dr. [name] is not available until 03/20/22. Dr. Georgeann Oppenheim office told him to contact Dr. Darlyn Chamber, NP.  Dr. Garlan Fair- Please advise on ordering a para

## 2022-03-07 ENCOUNTER — Ambulatory Visit
Admission: RE | Admit: 2022-03-07 | Discharge: 2022-03-07 | Disposition: A | Discharge status: Home / Self Care | Payer: BC Managed Care – PPO | Source: Ambulatory Visit | Attending: Gastroenterology | Admitting: Gastroenterology

## 2022-03-07 DIAGNOSIS — R188 Other ascites: Secondary | ICD-10-CM | POA: Insufficient documentation

## 2022-03-07 DIAGNOSIS — K7460 Unspecified cirrhosis of liver: Secondary | ICD-10-CM | POA: Insufficient documentation

## 2022-03-07 LAB — BODY FLUID CELL COUNT WITH DIFFERENTIAL
Eos, Fluid: 0 %
Lymphs, Fluid: 17 %
Monocyte-Macrophage-Serous Fluid: 35 % — ABNORMAL LOW (ref 50–90)
Neutrophil Count, Fluid: 48 % — ABNORMAL HIGH (ref 0–25)
Total Nucleated Cell Count, Fluid: 102 cu mm (ref 0–1000)

## 2022-03-07 LAB — ALBUMIN, PLEURAL OR PERITONEAL FLUID: Albumin, Fluid: <1.5 g/dL

## 2022-03-07 MED ORDER — HEPARIN SOD (PORK) LOCK FLUSH 100 UNIT/ML IV SOLN
INTRAVENOUS | Status: AC
  Filled 2022-03-07: qty 5

## 2022-03-07 MED ORDER — ALBUMIN HUMAN 25 % IV SOLN
25.0000 g | Freq: Once | INTRAVENOUS | Status: AC
  Administered 2022-03-07: 25 g via INTRAVENOUS

## 2022-03-07 MED ORDER — ALBUMIN HUMAN 25 % IV SOLN
INTRAVENOUS | Status: AC
  Filled 2022-03-07: qty 100

## 2022-03-07 MED ORDER — SODIUM CHLORIDE FLUSH 0.9 % IV SOLN
INTRAVENOUS | Status: AC
  Filled 2022-03-07: qty 10

## 2022-03-07 NOTE — Procedures (Signed)
PROCEDURE SUMMARY:  Successful US guided diagnostic and therapeutic paracentesis from RLQ.  Yielded 6.1 L of hazy, yellow fluid.  No immediate complications.  Pt tolerated well.   Specimen was sent for labs.  EBL < 1 mL  Tyson Alias, AGNP 03/07/2022 10:45 AM

## 2022-03-08 LAB — CYTOLOGY - NON PAP

## 2022-03-10 ENCOUNTER — Encounter: Payer: Self-pay | Admitting: Oncology

## 2022-03-10 LAB — TOTAL BILIRUBIN, BODY FLUID: Total bilirubin, fluid: <0.2 mg/dL

## 2022-03-12 LAB — AEROBIC/ANAEROBIC CULTURE W GRAM STAIN (SURGICAL/DEEP WOUND)
Culture: NO GROWTH
Gram Stain: NONE SEEN

## 2022-03-13 ENCOUNTER — Ambulatory Visit: Admission: RE | Admit: 2022-03-13 | Payer: BC Managed Care – PPO | Source: Ambulatory Visit

## 2022-03-13 ENCOUNTER — Telehealth: Payer: Self-pay

## 2022-03-14 ENCOUNTER — Inpatient Hospital Stay: Payer: Medicare Other | Admitting: Oncology

## 2022-03-14 ENCOUNTER — Inpatient Hospital Stay: Payer: Medicare Other

## 2022-03-31 NOTE — Telephone Encounter (Addendum)
Dr. Tasia Catchings received call from [facility] office informing her that pt passed away.   Please cancel CT and all future appts.

## 2022-03-31 DEATH — deceased

## 2022-05-17 ENCOUNTER — Telehealth: Payer: BC Managed Care – PPO | Admitting: Hospice and Palliative Medicine

## 2022-05-18 ENCOUNTER — Encounter: Payer: Self-pay | Admitting: Oncology

## 2022-05-29 ENCOUNTER — Encounter (INDEPENDENT_AMBULATORY_CARE_PROVIDER_SITE_OTHER): Payer: Self-pay

## 2022-05-31 ENCOUNTER — Other Ambulatory Visit: Payer: BC Managed Care – PPO

## 2022-06-07 ENCOUNTER — Ambulatory Visit: Payer: BC Managed Care – PPO

## 2022-06-08 IMAGING — US US ABDOMEN LIMITED
1 series · 10 of 10 positions shown · non-contrast
Comparison: 05/06/2021

CLINICAL DATA: Evaluate for ascites and potential paracentesis.

EXAM:
LIMITED ABDOMEN ULTRASOUND FOR ASCITES
TECHNIQUE: Limited ultrasound survey for ascites was performed in all four
abdominal quadrants.

[Series 1: us abdomen limited · 0.30mm/px · 10 of 10 slices shown]
[im 1/10]
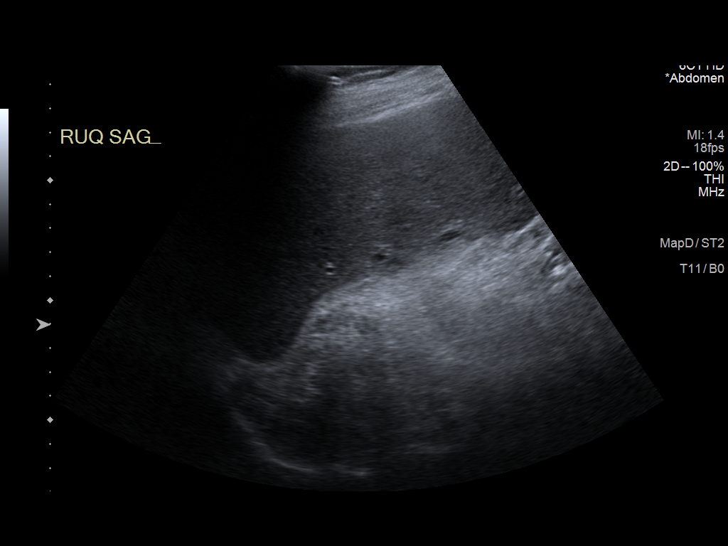
[im 2/10]
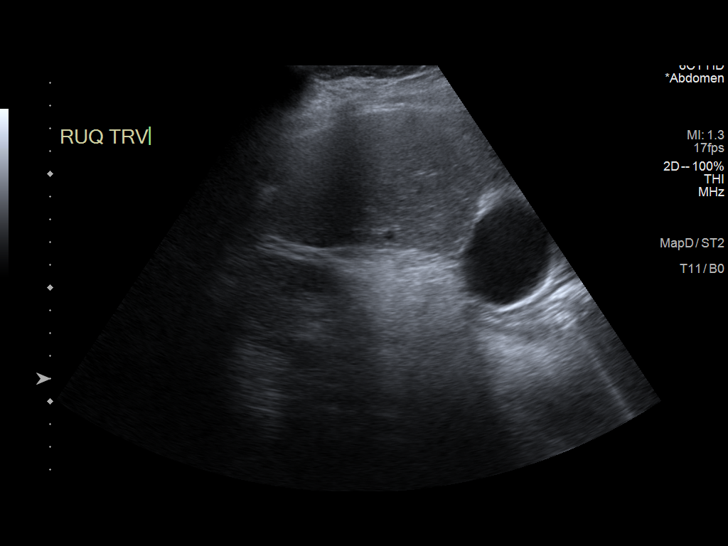
[im 3/10]
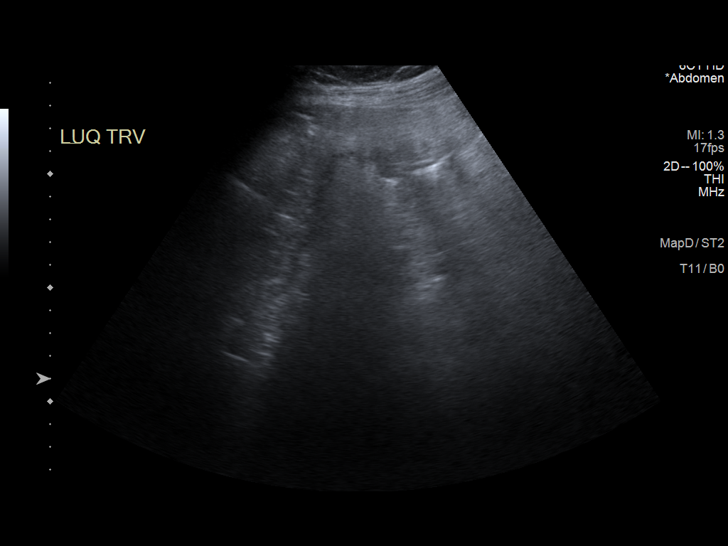
[im 4/10]
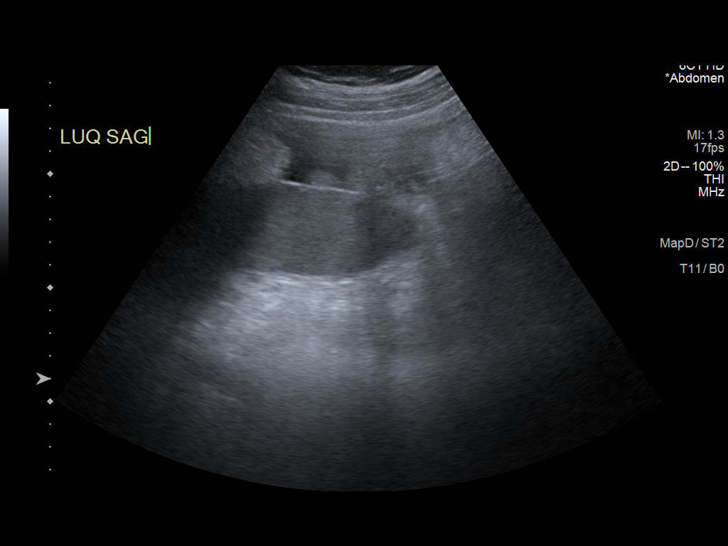
[im 5/10]
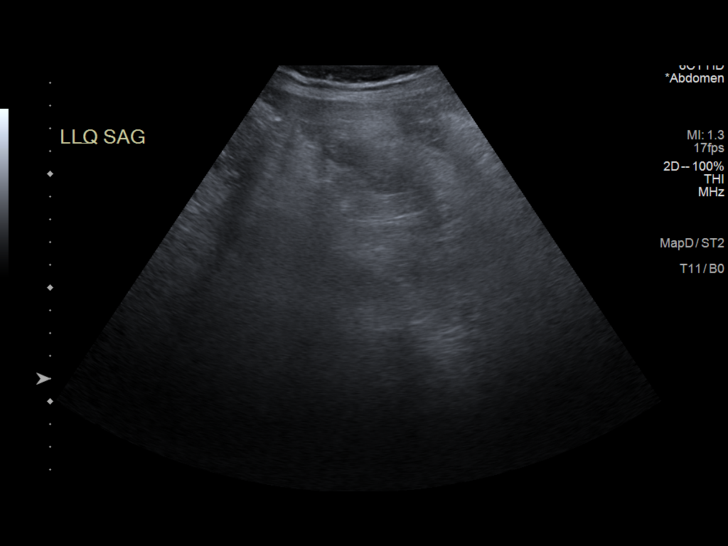
[im 6/10]
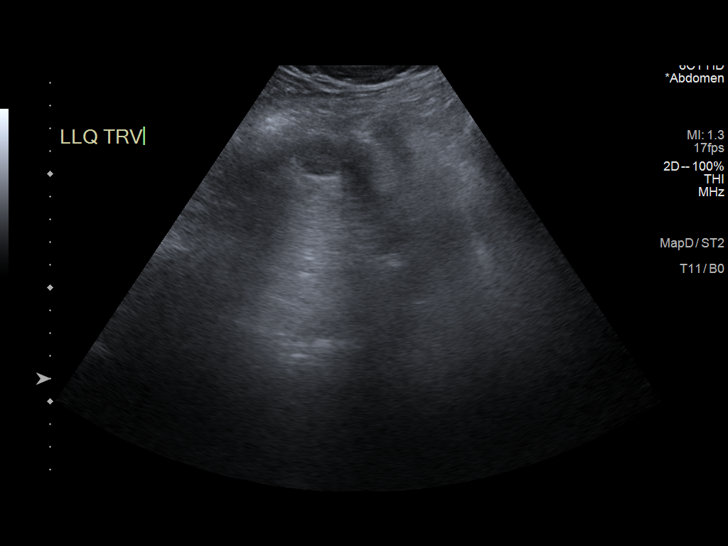
[im 7/10]
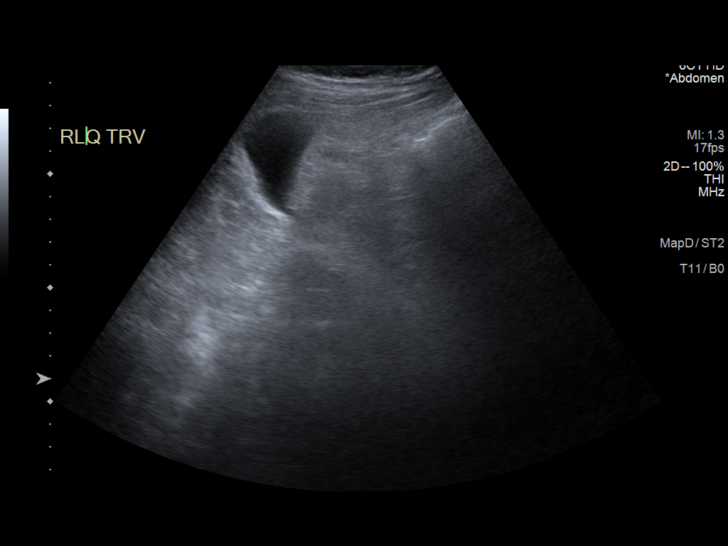
[im 8/10]
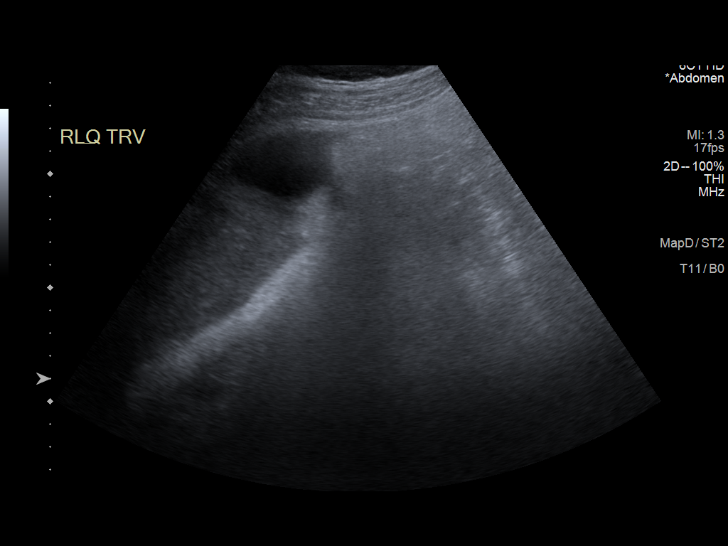
[im 9/10]
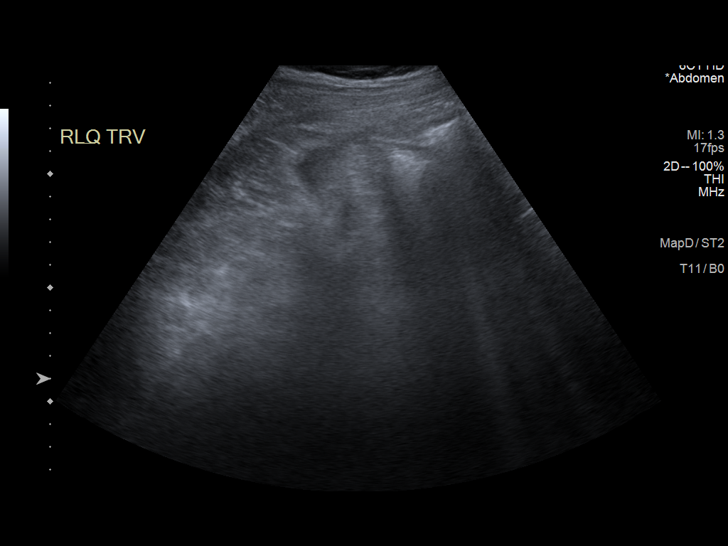
[im 10/10]
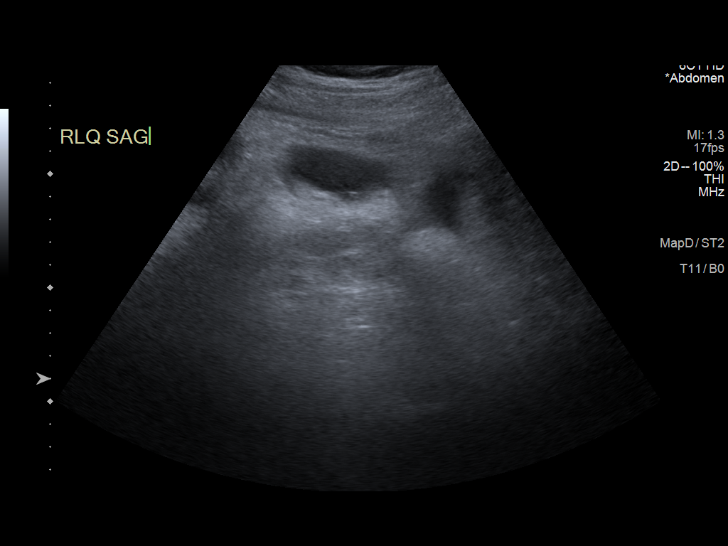

[10 of 10 positions shown; findings below may reference images not displayed]

FINDINGS: Minimal fluid in the right lower quadrant. No significant fluid on
left side of the abdomen.
IMPRESSION: Minimal ascites in the right lower quadrant. Paracentesis not
performed due to the small amount of fluid.

## 2022-06-14 ENCOUNTER — Institutional Professional Consult (permissible substitution): Payer: BC Managed Care – PPO | Admitting: Radiation Oncology
